# Patient Record
Sex: Male | Born: 1960
Health system: Southern US, Community
[De-identification: ages and names within clinical notes are randomized; demographics above are authoritative.]

## PROBLEM LIST (undated history)

## (undated) ENCOUNTER — Emergency Department (HOSPITAL_COMMUNITY): Admission: RE | Payer: Commercial Managed Care - PPO | Source: Home / Self Care

## (undated) DIAGNOSIS — F32A Depression, unspecified: Secondary | ICD-10-CM

## (undated) DIAGNOSIS — I5189 Other ill-defined heart diseases: Secondary | ICD-10-CM

## (undated) DIAGNOSIS — I517 Cardiomegaly: Secondary | ICD-10-CM

## (undated) DIAGNOSIS — I509 Heart failure, unspecified: Secondary | ICD-10-CM

## (undated) DIAGNOSIS — E785 Hyperlipidemia, unspecified: Secondary | ICD-10-CM

## (undated) DIAGNOSIS — Z87442 Personal history of urinary calculi: Secondary | ICD-10-CM

## (undated) DIAGNOSIS — M109 Gout, unspecified: Secondary | ICD-10-CM

## (undated) DIAGNOSIS — I2510 Atherosclerotic heart disease of native coronary artery without angina pectoris: Secondary | ICD-10-CM

## (undated) DIAGNOSIS — I10 Essential (primary) hypertension: Secondary | ICD-10-CM

## (undated) DIAGNOSIS — M1990 Unspecified osteoarthritis, unspecified site: Secondary | ICD-10-CM

## (undated) DIAGNOSIS — I739 Peripheral vascular disease, unspecified: Secondary | ICD-10-CM

## (undated) DIAGNOSIS — I213 ST elevation (STEMI) myocardial infarction of unspecified site: Secondary | ICD-10-CM

## (undated) DIAGNOSIS — F1910 Other psychoactive substance abuse, uncomplicated: Secondary | ICD-10-CM

## (undated) DIAGNOSIS — G709 Myoneural disorder, unspecified: Secondary | ICD-10-CM

## (undated) HISTORY — PX: CERVICAL SPINE SURGERY: SHX589

## (undated) HISTORY — PX: OTHER SURGICAL HISTORY: SHX169

---

## 2004-10-02 ENCOUNTER — Inpatient Hospital Stay: Payer: Self-pay | Admitting: Cardiology

## 2005-01-29 ENCOUNTER — Other Ambulatory Visit: Payer: Self-pay

## 2005-01-29 ENCOUNTER — Inpatient Hospital Stay: Payer: Self-pay | Admitting: Psychiatry

## 2005-02-04 ENCOUNTER — Ambulatory Visit: Payer: Self-pay | Admitting: Unknown Physician Specialty

## 2006-10-25 ENCOUNTER — Emergency Department: Payer: Self-pay | Admitting: General Practice

## 2008-08-14 ENCOUNTER — Ambulatory Visit: Payer: Self-pay | Admitting: Internal Medicine

## 2010-11-15 ENCOUNTER — Ambulatory Visit: Payer: Self-pay | Admitting: Internal Medicine

## 2011-02-26 ENCOUNTER — Emergency Department: Payer: Self-pay | Admitting: *Deleted

## 2011-11-20 ENCOUNTER — Ambulatory Visit: Payer: Self-pay

## 2011-11-20 ENCOUNTER — Emergency Department: Payer: Self-pay | Admitting: *Deleted

## 2011-11-20 LAB — CK TOTAL AND CKMB (NOT AT ARMC)
CK, Total: 62 U/L (ref 35–232)
CK-MB: <0.5 ng/mL — ABNORMAL LOW (ref 0.5–3.6)

## 2011-11-20 LAB — COMPREHENSIVE METABOLIC PANEL
Albumin: 3.7 g/dL (ref 3.4–5.0)
Alkaline Phosphatase: 105 U/L (ref 50–136)
Anion Gap: 7 (ref 7–16)
BUN: 7 mg/dL (ref 7–18)
Bilirubin,Total: 0.6 mg/dL (ref 0.2–1.0)
Chloride: 106 mmol/L (ref 98–107)
Co2: 25 mmol/L (ref 21–32)
Creatinine: 0.75 mg/dL (ref 0.60–1.30)
EGFR (African American): >60
EGFR (Non-African Amer.): >60
Glucose: 115 mg/dL — ABNORMAL HIGH (ref 65–99)
Potassium: 3.9 mmol/L (ref 3.5–5.1)
SGOT(AST): 24 U/L (ref 15–37)
SGPT (ALT): 20 U/L
Sodium: 138 mmol/L (ref 136–145)

## 2011-11-20 LAB — URINALYSIS, COMPLETE
Bacteria: NONE SEEN
Bilirubin,UR: NEGATIVE
Glucose,UR: NEGATIVE mg/dL (ref 0–75)
Ketone: NEGATIVE
Leukocyte Esterase: NEGATIVE
Protein: 100
Specific Gravity: 1.026 (ref 1.003–1.030)
Squamous Epithelial: NONE SEEN

## 2011-11-20 LAB — CBC
HCT: 49.6 % (ref 40.0–52.0)
HGB: 17.2 g/dL (ref 13.0–18.0)
MCH: 35.1 pg — ABNORMAL HIGH (ref 26.0–34.0)
MCHC: 34.7 g/dL (ref 32.0–36.0)
Platelet: 135 10*3/uL — ABNORMAL LOW (ref 150–440)
RBC: 4.89 10*6/uL (ref 4.40–5.90)
RDW: 14.3 % (ref 11.5–14.5)

## 2011-11-20 LAB — TROPONIN I: Troponin-I: <0.02 ng/mL

## 2011-11-20 LAB — ETHANOL: Ethanol %: <0.003 % (ref 0.000–0.080)

## 2012-09-23 ENCOUNTER — Ambulatory Visit: Payer: Self-pay

## 2014-03-24 ENCOUNTER — Inpatient Hospital Stay: Payer: Self-pay | Admitting: Internal Medicine

## 2014-03-24 LAB — CBC WITH DIFFERENTIAL/PLATELET
BASOS ABS: 0 10*3/uL (ref 0.0–0.1)
BASOS PCT: 0.4 %
EOS PCT: 0.1 %
Eosinophil #: 0 10*3/uL (ref 0.0–0.7)
HCT: 38.7 % — AB (ref 40.0–52.0)
HGB: 12.9 g/dL — ABNORMAL LOW (ref 13.0–18.0)
LYMPHS PCT: 2.9 %
Lymphocyte #: 0.4 10*3/uL — ABNORMAL LOW (ref 1.0–3.6)
MCH: 35.5 pg — ABNORMAL HIGH (ref 26.0–34.0)
MCHC: 33.4 g/dL (ref 32.0–36.0)
MCV: 106 fL — AB (ref 80–100)
Monocyte #: 0.6 x10 3/mm (ref 0.2–1.0)
Monocyte %: 4.9 %
NEUTROS ABS: 11.5 10*3/uL — AB (ref 1.4–6.5)
Neutrophil %: 91.7 %
PLATELETS: 103 10*3/uL — AB (ref 150–440)
RBC: 3.64 10*6/uL — ABNORMAL LOW (ref 4.40–5.90)
RDW: 14.5 % (ref 11.5–14.5)
WBC: 12.5 10*3/uL — AB (ref 3.8–10.6)

## 2014-03-24 LAB — COMPREHENSIVE METABOLIC PANEL
ANION GAP: 14 (ref 7–16)
Albumin: 2.9 g/dL — ABNORMAL LOW (ref 3.4–5.0)
Alkaline Phosphatase: 43 U/L — ABNORMAL LOW
BUN: 37 mg/dL — ABNORMAL HIGH (ref 7–18)
Bilirubin,Total: 0.5 mg/dL (ref 0.2–1.0)
CHLORIDE: 99 mmol/L (ref 98–107)
CO2: 19 mmol/L — AB (ref 21–32)
Calcium, Total: 9 mg/dL (ref 8.5–10.1)
Creatinine: 2.05 mg/dL — ABNORMAL HIGH (ref 0.60–1.30)
EGFR (African American): 42 — ABNORMAL LOW
GFR CALC NON AF AMER: 36 — AB
Glucose: 131 mg/dL — ABNORMAL HIGH (ref 65–99)
Osmolality: 275 (ref 275–301)
POTASSIUM: 2.6 mmol/L — AB (ref 3.5–5.1)
SGOT(AST): 49 U/L — ABNORMAL HIGH (ref 15–37)
SGPT (ALT): 15 U/L
SODIUM: 132 mmol/L — AB (ref 136–145)
TOTAL PROTEIN: 7.9 g/dL (ref 6.4–8.2)

## 2014-03-24 LAB — RAPID INFLUENZA A&B ANTIGENS (ARMC ONLY)

## 2014-03-24 LAB — MAGNESIUM: Magnesium: 1.7 mg/dL — ABNORMAL LOW

## 2014-03-24 LAB — LIPASE, BLOOD: LIPASE: 126 U/L (ref 73–393)

## 2014-03-25 LAB — CBC WITH DIFFERENTIAL/PLATELET
BASOS PCT: 0.3 %
Basophil #: 0 10*3/uL (ref 0.0–0.1)
Eosinophil #: 0 10*3/uL (ref 0.0–0.7)
Eosinophil %: 0 %
HCT: 34.5 % — ABNORMAL LOW (ref 40.0–52.0)
HGB: 11.5 g/dL — ABNORMAL LOW (ref 13.0–18.0)
Lymphocyte #: 0.5 10*3/uL — ABNORMAL LOW (ref 1.0–3.6)
Lymphocyte %: 5 %
MCH: 35.7 pg — AB (ref 26.0–34.0)
MCHC: 33.5 g/dL (ref 32.0–36.0)
MCV: 107 fL — ABNORMAL HIGH (ref 80–100)
Monocyte #: 0.5 x10 3/mm (ref 0.2–1.0)
Monocyte %: 5.1 %
NEUTROS PCT: 89.6 %
Neutrophil #: 9.2 10*3/uL — ABNORMAL HIGH (ref 1.4–6.5)
Platelet: 94 10*3/uL — ABNORMAL LOW (ref 150–440)
RBC: 3.23 10*6/uL — AB (ref 4.40–5.90)
RDW: 14.6 % — AB (ref 11.5–14.5)
WBC: 10.2 10*3/uL (ref 3.8–10.6)

## 2014-03-25 LAB — BASIC METABOLIC PANEL
Anion Gap: 13 (ref 7–16)
BUN: 27 mg/dL — ABNORMAL HIGH (ref 7–18)
CHLORIDE: 104 mmol/L (ref 98–107)
CO2: 17 mmol/L — AB (ref 21–32)
Calcium, Total: 8.3 mg/dL — ABNORMAL LOW (ref 8.5–10.1)
Creatinine: 1.42 mg/dL — ABNORMAL HIGH (ref 0.60–1.30)
EGFR (African American): >60
GFR CALC NON AF AMER: 56 — AB
GLUCOSE: 99 mg/dL (ref 65–99)
OSMOLALITY: 273 (ref 275–301)
POTASSIUM: 3.5 mmol/L (ref 3.5–5.1)
SODIUM: 134 mmol/L — AB (ref 136–145)

## 2014-03-25 LAB — MAGNESIUM: Magnesium: 1.9 mg/dL

## 2014-03-26 LAB — BASIC METABOLIC PANEL
ANION GAP: 12 (ref 7–16)
BUN: 14 mg/dL (ref 7–18)
CHLORIDE: 106 mmol/L (ref 98–107)
Calcium, Total: 8.5 mg/dL (ref 8.5–10.1)
Co2: 21 mmol/L (ref 21–32)
Creatinine: 1.14 mg/dL (ref 0.60–1.30)
EGFR (African American): >60
Glucose: 89 mg/dL (ref 65–99)
Osmolality: 277 (ref 275–301)
POTASSIUM: 2.7 mmol/L — AB (ref 3.5–5.1)
Sodium: 139 mmol/L (ref 136–145)

## 2014-03-26 LAB — CBC WITH DIFFERENTIAL/PLATELET
Basophil #: 0 10*3/uL (ref 0.0–0.1)
Basophil %: 0.2 %
EOS ABS: 0 10*3/uL (ref 0.0–0.7)
Eosinophil %: 0.1 %
HCT: 32.1 % — ABNORMAL LOW (ref 40.0–52.0)
HGB: 10.6 g/dL — ABNORMAL LOW (ref 13.0–18.0)
Lymphocyte #: 0.4 10*3/uL — ABNORMAL LOW (ref 1.0–3.6)
Lymphocyte %: 5.7 %
MCH: 35.1 pg — ABNORMAL HIGH (ref 26.0–34.0)
MCHC: 33 g/dL (ref 32.0–36.0)
MCV: 106 fL — AB (ref 80–100)
MONO ABS: 0.3 x10 3/mm (ref 0.2–1.0)
Monocyte %: 4.9 %
NEUTROS ABS: 6.1 10*3/uL (ref 1.4–6.5)
Neutrophil %: 89.1 %
Platelet: 137 10*3/uL — ABNORMAL LOW (ref 150–440)
RBC: 3.02 10*6/uL — ABNORMAL LOW (ref 4.40–5.90)
RDW: 15.1 % — ABNORMAL HIGH (ref 11.5–14.5)
WBC: 6.8 10*3/uL (ref 3.8–10.6)

## 2014-03-27 LAB — BASIC METABOLIC PANEL
Anion Gap: 9 (ref 7–16)
BUN: 11 mg/dL (ref 7–18)
CALCIUM: 8.2 mg/dL — AB (ref 8.5–10.1)
CHLORIDE: 108 mmol/L — AB (ref 98–107)
CO2: 19 mmol/L — AB (ref 21–32)
Creatinine: 1.12 mg/dL (ref 0.60–1.30)
EGFR (African American): >60
EGFR (Non-African Amer.): >60
GLUCOSE: 106 mg/dL — AB (ref 65–99)
Osmolality: 272 (ref 275–301)
Potassium: 3 mmol/L — ABNORMAL LOW (ref 3.5–5.1)
Sodium: 136 mmol/L (ref 136–145)

## 2014-03-28 LAB — BASIC METABOLIC PANEL
Anion Gap: 6 — ABNORMAL LOW (ref 7–16)
BUN: 8 mg/dL (ref 7–18)
CO2: 22 mmol/L (ref 21–32)
Calcium, Total: 7.8 mg/dL — ABNORMAL LOW (ref 8.5–10.1)
Chloride: 111 mmol/L — ABNORMAL HIGH (ref 98–107)
Creatinine: 0.97 mg/dL (ref 0.60–1.30)
EGFR (African American): >60
Glucose: 106 mg/dL — ABNORMAL HIGH (ref 65–99)
Osmolality: 276 (ref 275–301)
Potassium: 3.4 mmol/L — ABNORMAL LOW (ref 3.5–5.1)
Sodium: 139 mmol/L (ref 136–145)

## 2014-03-29 LAB — EXPECTORATED SPUTUM ASSESSMENT W REFEX TO RESP CULTURE

## 2014-03-29 LAB — CULTURE, BLOOD (SINGLE)

## 2014-03-31 LAB — CULTURE, BLOOD (SINGLE)
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
## 2014-10-28 NOTE — Discharge Summary (Signed)
Dates of Admission and Diagnosis:  Date of Admission 24-Mar-2014   Date of Discharge 28-Mar-2014   Admitting Diagnosis sepsis- pneumonia   Final Diagnosis Sepsis Legionella and possible strep pneumonia ( awaited final sputum cx report) Acute renal failure - on presentation- dehydration- improved Hypokalemia Smoking.    Chief Complaint/History of Present Illness a very pleasant 54 year old man with past medical history of hypertension, ongoing tobacco abuse, presents with fevers, chills, vomiting for the past 3 days. He reports his symptoms started 3 days ago with chills, runny nose, and headache, progressed to vomiting that evening. He has not been able to keep anything down except for small sips of Sunny-D and water for the past 3 days. He denies any cough or shortness of breath. He endorses sweats and chills. He has not had any chest pain. He has had 2 episodes of syncope, one last night and one this morning when he fell out of the shower to the floor. After that incident, he tried to call his sons and neighbors and was unable to get in touch with anyone. He then proceeded to drive himself to the Emergency Room very slowly and pulling off frequently for vomiting and weakness. On presentation to the Emergency Room, his temperature is 101.2, white count is elevated at 12.5, and his chest x-ray shows a left lower lobe pneumonia.   Allergies:  No Known Allergies:   Hepatic:  18-Sep-15 08:50   Bilirubin, Total 0.5  Alkaline Phosphatase  43 (46-116 NOTE: New Reference Range 01/24/14)  SGPT (ALT) 15 (14-63 NOTE: New Reference Range 01/24/14)  SGOT (AST)  49  Total Protein, Serum 7.9  Albumin, Serum  2.9  Routine Micro:  18-Sep-15 08:50   Micro Text Report INFLUENZA A+B ANTIGENS   COMMENT                   NEGATIVE FOR INFLUENZA A (ANTIGEN ABSENT)   COMMENT                   NEGATIVE FOR INFLUENZA B (ANTIGEN ABSENT)   ANTIBIOTIC                       Comment 1.. NEGATIVE FOR  INFLUENZA A (ANTIGEN ABSENT) A negative result does not exclude influenza. Correlation with clinical impression is required.  Comment 2.. NEGATIVE FOR INFLUENZA B (ANTIGEN ABSENT)  Result(s) reported on 24 Mar 2014 at 09:49AM.  20-Sep-15 12:32   Micro Text Report BLOOD CULTURE   COMMENT                   NO GROWTH IN 48 HOURS   ANTIBIOTIC                       Culture Comment NO GROWTH IN 48 HOURS  Result(s) reported on 28 Mar 2014 at 12:00PM.  Specimen Source left ac  21-Sep-15 08:35   Micro Text Report SPUTUM CULTURE   COMMENT                   APPEARS TO BE NORMAL FLORA AT 48 HOURS   GRAM STAIN                FAIR SPECIMEN-70-80% WBC   GRAM STAIN                MODERATE WHITE BLOOD CELLS   GRAM STAIN  FEW GRAM POSITIVE COCCI IN CHAINS IN CLUSTERS   GRAM STAIN                MODERATE GRAM NEGATIVE COCCO-BACILLI   ANTIBIOTIC                       Culture Comment APPEARS TO BE NORMAL FLORA AT 48 HOURS  Gram Stain 1 FAIR SPECIMEN-70-80% WBC  Gram Stain 2 MODERATE WHITE BLOOD CELLS  Gram Stain 3 FEW GRAM POSITIVE COCCI IN CHAINS IN CLUSTERS  Gram Stain 4 MODERATE GRAM NEGATIVE COCCO-BACILLI  Result(s) reported on 29 Mar 2014 at 02:55PM.  Routine Chem:  18-Sep-15 08:50   Glucose, Serum  131  BUN  37  Creatinine (comp)  2.05  Sodium, Serum  132  Potassium, Serum  2.6  Chloride, Serum 99  CO2, Serum  19  Calcium (Total), Serum 9.0  Anion Gap 14  Osmolality (calc) 275  eGFR (African American)  42  eGFR (Non-African American)  36 (eGFR values <26m/min/1.73 m2 may be an indication of chronic kidney disease (CKD). Calculated eGFR is useful in patients with stable renal function. The eGFR calculation will not be reliable in acutely ill patients when serum creatinine is changing rapidly. It is not useful in  patients on dialysis. The eGFR calculation may not be applicable to patients at the low and high extremes of body sizes, pregnant women, and vegetarians.)   Magnesium, Serum  1.7 (1.8-2.4 THERAPEUTIC RANGE: 4-7 mg/dL TOXIC: > 10 mg/dL  -----------------------)  Lipase 126 (Result(s) reported on 24 Mar 2014 at 09:17AM.)  19-Sep-15 01:57   Glucose, Serum 99  BUN  27  Creatinine (comp)  1.42  Sodium, Serum  134  Potassium, Serum 3.5  Chloride, Serum 104  CO2, Serum  17  Calcium (Total), Serum  8.3  Anion Gap 13  Osmolality (calc) 273  eGFR (African American) >60  eGFR (Non-African American)  56 (eGFR values <62mmin/1.73 m2 may be an indication of chronic kidney disease (CKD). Calculated eGFR is useful in patients with stable renal function. The eGFR calculation will not be reliable in acutely ill patients when serum creatinine is changing rapidly. It is not useful in  patients on dialysis. The eGFR calculation may not be applicable to patients at the low and high extremes of body sizes, pregnant women, and vegetarians.)  Magnesium, Serum 1.9 (1.8-2.4 THERAPEUTIC RANGE: 4-7 mg/dL TOXIC: > 10 mg/dL  -----------------------)  20-Sep-15 12:32   Glucose, Serum 89  BUN 14  Creatinine (comp) 1.14  Sodium, Serum 139  Potassium, Serum  2.7  Chloride, Serum 106  CO2, Serum 21  Calcium (Total), Serum 8.5  Anion Gap 12  Osmolality (calc) 277  eGFR (African American) >60  eGFR (Non-African American) >60 (eGFR values <6034min/1.73 m2 may be an indication of chronic kidney disease (CKD). Calculated eGFR is useful in patients with stable renal function. The eGFR calculation will not be reliable in acutely ill patients when serum creatinine is changing rapidly. It is not useful in  patients on dialysis. The eGFR calculation may not be applicable to patients at the low and high extremes of body sizes, pregnant women, and vegetarians.)  21-Sep-15 05:26   Glucose, Serum  106  BUN 11  Creatinine (comp) 1.12  Sodium, Serum 136  Potassium, Serum  3.0  Chloride, Serum  108  CO2, Serum  19  Calcium (Total), Serum  8.2  Anion Gap 9   Osmolality (calc) 272  eGFR (African American) >  60  eGFR (Non-African American) >60 (eGFR values <46m/min/1.73 m2 may be an indication of chronic kidney disease (CKD). Calculated eGFR is useful in patients with stable renal function. The eGFR calculation will not be reliable in acutely ill patients when serum creatinine is changing rapidly. It is not useful in  patients on dialysis. The eGFR calculation may not be applicable to patients at the low and high extremes of body sizes, pregnant women, and vegetarians.)  22-Sep-15 08:23   Glucose, Serum  106  BUN 8  Creatinine (comp) 0.97  Sodium, Serum 139  Potassium, Serum  3.4  Chloride, Serum  111  CO2, Serum 22  Calcium (Total), Serum  7.8  Anion Gap  6  Osmolality (calc) 276  eGFR (African American) >60  eGFR (Non-African American) >60 (eGFR values <66mmin/1.73 m2 may be an indication of chronic kidney disease (CKD). Calculated eGFR is useful in patients with stable renal function. The eGFR calculation will not be reliable in acutely ill patients when serum creatinine is changing rapidly. It is not useful in  patients on dialysis. The eGFR calculation may not be applicable to patients at the low and high extremes of body sizes, pregnant women, and vegetarians.)  Routine Hem:  18-Sep-15 08:50   WBC (CBC)  12.5  RBC (CBC)  3.64  Hemoglobin (CBC)  12.9  Hematocrit (CBC)  38.7  Platelet Count (CBC)  103  MCV  106  MCH  35.5  MCHC 33.4  RDW 14.5  Neutrophil % 91.7  Lymphocyte % 2.9  Monocyte % 4.9  Eosinophil % 0.1  Basophil % 0.4  Neutrophil #  11.5  Lymphocyte #  0.4  Monocyte # 0.6  Eosinophil # 0.0  Basophil # 0.0 (Result(s) reported on 24 Mar 2014 at 09:03AM.)  19-Sep-15 01:57   WBC (CBC) 10.2  RBC (CBC)  3.23  Hemoglobin (CBC)  11.5  Hematocrit (CBC)  34.5  Platelet Count (CBC)  94  MCV  107  MCH  35.7  MCHC 33.5  RDW  14.6  Neutrophil % 89.6  Lymphocyte % 5.0  Monocyte % 5.1  Eosinophil % 0.0   Basophil % 0.3  Neutrophil #  9.2  Lymphocyte #  0.5  Monocyte # 0.5  Eosinophil # 0.0  Basophil # 0.0 (Result(s) reported on 25 Mar 2014 at 02:26AM.)  20-Sep-15 12:32   WBC (CBC) 6.8  RBC (CBC)  3.02  Hemoglobin (CBC)  10.6  Hematocrit (CBC)  32.1  Platelet Count (CBC)  137  MCV  106  MCH  35.1  MCHC 33.0  RDW  15.1  Neutrophil % 89.1  Lymphocyte % 5.7  Monocyte % 4.9  Eosinophil % 0.1  Basophil % 0.2  Neutrophil # 6.1  Lymphocyte #  0.4  Monocyte # 0.3  Eosinophil # 0.0  Basophil # 0.0 (Result(s) reported on 26 Mar 2014 at 12:56PM.)   PERTINENT RADIOLOGY STUDIES: XRay:    20-Sep-15 12:49, Chest PA and Lateral  Chest PA and Lateral   REASON FOR EXAM:    pneumonia- compare  COMMENTS:       PROCEDURE: DXR - DXR CHEST PA (OR AP) AND LATERAL  - Mar 26 2014 12:49PM     CLINICAL DATA:  Fever. Chills. Hypotension. Followup left lower lobe  pneumonia.    EXAM:  CHEST  2 VIEW    COMPARISON:  Two-view chest x-ray 03/24/2014, 11/20/2011.    FINDINGS:  Cardiomediastinal silhouette unremarkable, unchanged. Interval  marked progression of the airspace consolidation, now involving  essentially the entire left lower lobe. Left upper lobe andright  lung remain clear. Degenerative changes involving the thoracic spine  and slight thoracic scoliosis convex right again noted.     IMPRESSION:  Interval marked worsening of pneumonia since the examination 2 days  ago, now essentially the entireleft lower lobe.      Electronically Signed    By: Evangeline Dakin M.D.    On: 03/26/2014 12:54       Verified By: Deniece Portela, M.D.,  LabUnknown:  PACS Image    Pertinent Past History:  Pertinent Past History 1.  Hypertension.  2.  Coronary artery disease, status post PCI in 2006.  3.  Ongoing tobacco abuse.  4.  History of alcohol abuse   Hospital Course:  Hospital Course 1. Sepsis: present on admisison.  heart rate of 130, temperature of 101.2, hypotension with  blood pressure of 103/79 in a patient with chronic hypertension.  due to left lower pneumonia. Blood cultures are negative. The patient was not producing sputum initially- but had one with indusction lateron. BP now stable.  2  Left lower lobe pneumonia: Community-acquired pneumonia.   Azithromycin and ceftriaxone . Influenza A and B antigen screens are negative. He is not hypoxic or in respiratory distress.     Continued to have fever, and Xray shows worsening of Pneumonia- Started on Linezolid, Zosyn, Levaquin.   collected sputum cx- Spoke to Micro lab- it is probably streptococci, will be final report tomorrow.   Urine positive  for legionella Ag.   i spoke to Dr. Ola Spurr on phone- as per him Levaquin 750 mg daily- should be able to cover both- but we need to follwo up final cx report even after discharge.   sputum- Gram positive cocci in chain.  3  Acute renal failure.     likely due to ATN, prerenal azotemia due to decreased p.o. intake with nausea and vomiting.hydrate aggressively and continue to monitor. improving.   normal now.  4  Hypokalemia: Likely due to gastrointestinal losses. monitor and replete as needed.   5  Hypomagnesemia: Likely due to gastrointestinal losses. Repleted.  6. macrocytosis- Ch alcoholism- councelled- no withdrawal signs.  7. smoking- councelled for cessation- he is on nicotin patch- will give on d/c. time spent 4 min. d/c home today.   Condition on Discharge Stable   Code Status:  Code Status Full Code   DISCHARGE INSTRUCTIONS HOME MEDS:  Medication Reconciliation: Patient's Home Medications at Discharge:     Medication Instructions  lisinopril 10 mg oral tablet  1 tab(s) orally once a day   aspirin 81 mg oral tablet  1 tab(s) orally once a day   acetaminophen 325 mg oral tablet  2 tab(s) orally every 4 hours, As needed, mild pain (1-3/10) or temp. greater than 100.4   benzocaine-menthol topical  1 lozenge orally every 2 hours, As Needed,  cough, sore throat , As needed, cough, sore throat   levaquin 750 mg oral tablet  1 tab(s) orally every 24 hours x 4 days    STOP TAKING THE FOLLOWING MEDICATION(S):    metoprolol tartrate 50 mg tablet: 1 tab(s) orally 2 times a day x 30 days  Physician's Instructions:  Diet Low Sodium   Activity Limitations As tolerated   Return to Work Not Applicable   Time frame for Follow Up Appointment 1-2 weeks  PMD   Other Comments follow with PMD in office. If have fever in next few days - contact your PMD's  office or come to ER.     Domenick Gong R(Family Physician): [facility], [address], [place], [place] [address], [place]  Electronic Signatures: Vaughan Basta (MD)  (Signed 24-Sep-15 14:04)  Authored: ADMISSION DATE AND DIAGNOSIS, CHIEF COMPLAINT/HPI, Allergies, PERTINENT LABS, PERTINENT RADIOLOGY STUDIES, PERTINENT PAST HISTORY, HOSPITAL COURSE, DISCHARGE INSTRUCTIONS HOME MEDS, PATIENT INSTRUCTIONS, Follow Up Physician   Last Updated: 24-Sep-15 14:04 by Vaughan Basta (MD)

## 2015-07-08 DIAGNOSIS — Z9861 Coronary angioplasty status: Secondary | ICD-10-CM

## 2015-07-08 DIAGNOSIS — I213 ST elevation (STEMI) myocardial infarction of unspecified site: Secondary | ICD-10-CM

## 2015-07-08 DIAGNOSIS — Z955 Presence of coronary angioplasty implant and graft: Secondary | ICD-10-CM

## 2015-07-08 HISTORY — DX: ST elevation (STEMI) myocardial infarction of unspecified site: I21.3

## 2015-07-08 HISTORY — DX: Coronary angioplasty status: Z98.61

## 2015-07-08 HISTORY — DX: Presence of coronary angioplasty implant and graft: Z95.5

## 2016-06-17 ENCOUNTER — Emergency Department
Admission: EM | Admit: 2016-06-17 | Discharge: 2016-06-17 | Disposition: A | Discharge status: Other Acute Inpatient Hospital | Payer: Commercial Managed Care - PPO | Attending: Emergency Medicine | Admitting: Emergency Medicine

## 2016-06-17 ENCOUNTER — Ambulatory Visit (HOSPITAL_COMMUNITY): Admit: 2016-06-17 | Payer: Commercial Managed Care - PPO | Admitting: Cardiovascular Disease

## 2016-06-17 ENCOUNTER — Other Ambulatory Visit (HOSPITAL_COMMUNITY): Payer: Commercial Managed Care - PPO

## 2016-06-17 ENCOUNTER — Other Ambulatory Visit: Payer: Self-pay

## 2016-06-17 ENCOUNTER — Inpatient Hospital Stay (HOSPITAL_COMMUNITY)
Admission: RE | Admit: 2016-06-17 | Discharge: 2016-06-20 | DRG: 247 | Disposition: A | Discharge status: Home / Self Care | Payer: Commercial Managed Care - PPO | Source: Other Acute Inpatient Hospital | Attending: Cardiovascular Disease | Admitting: Cardiovascular Disease

## 2016-06-17 ENCOUNTER — Emergency Department: Payer: Commercial Managed Care - PPO

## 2016-06-17 ENCOUNTER — Encounter (HOSPITAL_COMMUNITY)
Admission: RE | Disposition: A | Discharge status: Home / Self Care | Payer: Self-pay | Source: Other Acute Inpatient Hospital | Attending: Cardiovascular Disease

## 2016-06-17 DIAGNOSIS — Z955 Presence of coronary angioplasty implant and graft: Secondary | ICD-10-CM

## 2016-06-17 DIAGNOSIS — Z23 Encounter for immunization: Secondary | ICD-10-CM

## 2016-06-17 DIAGNOSIS — I119 Hypertensive heart disease without heart failure: Secondary | ICD-10-CM | POA: Diagnosis not present

## 2016-06-17 DIAGNOSIS — E785 Hyperlipidemia, unspecified: Secondary | ICD-10-CM | POA: Diagnosis present

## 2016-06-17 DIAGNOSIS — I2102 ST elevation (STEMI) myocardial infarction involving left anterior descending coronary artery: Secondary | ICD-10-CM

## 2016-06-17 DIAGNOSIS — I2109 ST elevation (STEMI) myocardial infarction involving other coronary artery of anterior wall: Principal | ICD-10-CM

## 2016-06-17 DIAGNOSIS — I2510 Atherosclerotic heart disease of native coronary artery without angina pectoris: Secondary | ICD-10-CM | POA: Insufficient documentation

## 2016-06-17 DIAGNOSIS — R079 Chest pain, unspecified: Secondary | ICD-10-CM | POA: Diagnosis present

## 2016-06-17 DIAGNOSIS — I10 Essential (primary) hypertension: Secondary | ICD-10-CM

## 2016-06-17 DIAGNOSIS — I513 Intracardiac thrombosis, not elsewhere classified: Secondary | ICD-10-CM | POA: Diagnosis not present

## 2016-06-17 DIAGNOSIS — Z8249 Family history of ischemic heart disease and other diseases of the circulatory system: Secondary | ICD-10-CM | POA: Diagnosis not present

## 2016-06-17 DIAGNOSIS — F17200 Nicotine dependence, unspecified, uncomplicated: Secondary | ICD-10-CM | POA: Insufficient documentation

## 2016-06-17 DIAGNOSIS — I200 Unstable angina: Secondary | ICD-10-CM

## 2016-06-17 DIAGNOSIS — E782 Mixed hyperlipidemia: Secondary | ICD-10-CM | POA: Diagnosis not present

## 2016-06-17 DIAGNOSIS — I213 ST elevation (STEMI) myocardial infarction of unspecified site: Secondary | ICD-10-CM | POA: Diagnosis present

## 2016-06-17 HISTORY — PX: CARDIAC CATHETERIZATION: SHX172

## 2016-06-17 HISTORY — DX: Hyperlipidemia, unspecified: E78.5

## 2016-06-17 HISTORY — DX: Atherosclerotic heart disease of native coronary artery without angina pectoris: I25.10

## 2016-06-17 HISTORY — DX: Essential (primary) hypertension: I10

## 2016-06-17 LAB — CBC
HEMATOCRIT: 59.1 % — AB (ref 40.0–52.0)
HEMOGLOBIN: 20.4 g/dL — AB (ref 13.0–18.0)
MCH: 39.4 pg — AB (ref 26.0–34.0)
MCHC: 34.6 g/dL (ref 32.0–36.0)
MCV: 114 fL — ABNORMAL HIGH (ref 80.0–100.0)
Platelets: 154 10*3/uL (ref 150–440)
RBC: 5.18 MIL/uL (ref 4.40–5.90)
RDW: 15.1 % — ABNORMAL HIGH (ref 11.5–14.5)
WBC: 12.2 10*3/uL — ABNORMAL HIGH (ref 3.8–10.6)

## 2016-06-17 LAB — TROPONIN I
Troponin I: 0.48 ng/mL (ref <0.03)
Troponin I: >65 ng/mL (ref <0.03)

## 2016-06-17 LAB — POCT ACTIVATED CLOTTING TIME: ACTIVATED CLOTTING TIME: 434 s

## 2016-06-17 LAB — PROTIME-INR
INR: 1.09
PROTHROMBIN TIME: 14.1 s (ref 11.4–15.2)

## 2016-06-17 LAB — BASIC METABOLIC PANEL
ANION GAP: 12 (ref 5–15)
BUN: 7 mg/dL (ref 6–20)
CHLORIDE: 100 mmol/L — AB (ref 101–111)
CO2: 25 mmol/L (ref 22–32)
Calcium: 9.1 mg/dL (ref 8.9–10.3)
Creatinine, Ser: 1.1 mg/dL (ref 0.61–1.24)
GFR calc Af Amer: >60 mL/min (ref >60)
GFR calc non Af Amer: >60 mL/min (ref >60)
GLUCOSE: 150 mg/dL — AB (ref 65–99)
POTASSIUM: 2.9 mmol/L — AB (ref 3.5–5.1)
Sodium: 137 mmol/L (ref 135–145)

## 2016-06-17 LAB — MRSA PCR SCREENING: MRSA BY PCR: NEGATIVE

## 2016-06-17 SURGERY — LEFT HEART CATH AND CORONARY ANGIOGRAPHY
Anesthesia: LOCAL

## 2016-06-17 MED ORDER — BIVALIRUDIN 250 MG IV SOLR
INTRAVENOUS | Status: AC
  Filled 2016-06-17: qty 250

## 2016-06-17 MED ORDER — NITROGLYCERIN IN D5W 200-5 MCG/ML-% IV SOLN
INTRAVENOUS | Status: AC
  Filled 2016-06-17: qty 250

## 2016-06-17 MED ORDER — HEPARIN (PORCINE) IN NACL 2-0.9 UNIT/ML-% IJ SOLN
INTRAMUSCULAR | Status: DC | PRN
  Administered 2016-06-17: 1000 mL

## 2016-06-17 MED ORDER — HYDRALAZINE HCL 20 MG/ML IJ SOLN: 5.0000 mg | INTRAMUSCULAR | Status: DC | PRN

## 2016-06-17 MED ORDER — HEPARIN (PORCINE) IN NACL 100-0.45 UNIT/ML-% IJ SOLN: 1150.0000 [IU]/h | INTRAMUSCULAR | Status: DC

## 2016-06-17 MED ORDER — ACETAMINOPHEN 325 MG PO TABS: 650.0000 mg | ORAL_TABLET | ORAL | Status: DC | PRN

## 2016-06-17 MED ORDER — HEPARIN (PORCINE) IN NACL 100-0.45 UNIT/ML-% IJ SOLN
1500.0000 [IU]/h | INTRAMUSCULAR | Status: DC
  Administered 2016-06-17: 1150 [IU]/h via INTRAVENOUS
  Filled 2016-06-17: qty 250

## 2016-06-17 MED ORDER — SODIUM CHLORIDE 0.9 % IV SOLN
INTRAVENOUS | Status: DC | PRN
  Administered 2016-06-17: 10 mL/h via INTRAVENOUS

## 2016-06-17 MED ORDER — CLOPIDOGREL BISULFATE 300 MG PO TABS
ORAL_TABLET | ORAL | Status: DC | PRN
  Administered 2016-06-17: 600 mg via ORAL

## 2016-06-17 MED ORDER — LIDOCAINE HCL (PF) 1 % IJ SOLN
INTRAMUSCULAR | Status: DC | PRN
  Administered 2016-06-17: 20 mL via INTRADERMAL

## 2016-06-17 MED ORDER — IOPAMIDOL (ISOVUE-370) INJECTION 76%
INTRAVENOUS | Status: DC | PRN
  Administered 2016-06-17: 160 mL via INTRA_ARTERIAL

## 2016-06-17 MED ORDER — LIDOCAINE HCL (PF) 1 % IJ SOLN
INTRAMUSCULAR | Status: AC
  Filled 2016-06-17: qty 30

## 2016-06-17 MED ORDER — PNEUMOCOCCAL VAC POLYVALENT 25 MCG/0.5ML IJ INJ
0.5000 mL | INJECTION | INTRAMUSCULAR | Status: AC
  Administered 2016-06-18: 0.5 mL via INTRAMUSCULAR
  Filled 2016-06-17: qty 0.5

## 2016-06-17 MED ORDER — ONDANSETRON HCL 4 MG/2ML IJ SOLN: 4.0000 mg | Freq: Four times a day (QID) | INTRAMUSCULAR | Status: DC | PRN

## 2016-06-17 MED ORDER — LABETALOL HCL 5 MG/ML IV SOLN
10.0000 mg | INTRAVENOUS | Status: DC | PRN
  Administered 2016-06-17: 10 mg via INTRAVENOUS

## 2016-06-17 MED ORDER — ASPIRIN 81 MG PO CHEW
81.0000 mg | CHEWABLE_TABLET | Freq: Every day | ORAL | Status: DC
  Administered 2016-06-17 – 2016-06-20 (×4): 81 mg via ORAL
  Filled 2016-06-17 (×4): qty 1

## 2016-06-17 MED ORDER — IOPAMIDOL (ISOVUE-370) INJECTION 76%
INTRAVENOUS | Status: AC
  Filled 2016-06-17: qty 125

## 2016-06-17 MED ORDER — ASPIRIN 81 MG PO CHEW
324.0000 mg | CHEWABLE_TABLET | Freq: Once | ORAL | Status: AC
  Administered 2016-06-17: 324 mg via ORAL

## 2016-06-17 MED ORDER — POTASSIUM CHLORIDE 20 MEQ PO PACK
PACK | ORAL | Status: DC | PRN
Start: 2016-06-17 — End: 2016-06-17
  Administered 2016-06-17: 40 meq via ORAL

## 2016-06-17 MED ORDER — MORPHINE SULFATE (PF) 2 MG/ML IV SOLN: 2.0000 mg | INTRAVENOUS | Status: DC | PRN

## 2016-06-17 MED ORDER — FAMOTIDINE IN NACL 20-0.9 MG/50ML-% IV SOLN
INTRAVENOUS | Status: AC
  Filled 2016-06-17: qty 50

## 2016-06-17 MED ORDER — HEPARIN SODIUM (PORCINE) 5000 UNIT/ML IJ SOLN
4000.0000 [IU] | Freq: Once | INTRAMUSCULAR | Status: AC
  Administered 2016-06-17: 4000 [IU] via INTRAVENOUS

## 2016-06-17 MED ORDER — SODIUM CHLORIDE 0.9 % IV SOLN
INTRAVENOUS | Status: AC
  Administered 2016-06-17: 07:00:00 via INTRAVENOUS

## 2016-06-17 MED ORDER — BIVALIRUDIN 250 MG IV SOLR
1.7500 mg/kg/h | INTRAVENOUS | Status: AC
  Administered 2016-06-17 (×3): 1.75 mg/kg/h via INTRAVENOUS
  Filled 2016-06-17 (×2): qty 250

## 2016-06-17 MED ORDER — INFLUENZA VAC SPLIT QUAD 0.5 ML IM SUSY
0.5000 mL | PREFILLED_SYRINGE | INTRAMUSCULAR | Status: AC
  Administered 2016-06-18: 0.5 mL via INTRAMUSCULAR
  Filled 2016-06-17: qty 0.5

## 2016-06-17 MED ORDER — CLOPIDOGREL BISULFATE 75 MG PO TABS
ORAL_TABLET | ORAL | Status: AC
  Filled 2016-06-17: qty 8

## 2016-06-17 MED ORDER — SODIUM CHLORIDE 0.9 % IV SOLN: 250.0000 mL | INTRAVENOUS | Status: DC | PRN

## 2016-06-17 MED ORDER — POTASSIUM CHLORIDE CRYS ER 20 MEQ PO TBCR
EXTENDED_RELEASE_TABLET | ORAL | Status: AC
  Filled 2016-06-17: qty 2

## 2016-06-17 MED ORDER — IOPAMIDOL (ISOVUE-370) INJECTION 76%
INTRAVENOUS | Status: AC
  Filled 2016-06-17: qty 100

## 2016-06-17 MED ORDER — HYDRALAZINE HCL 20 MG/ML IJ SOLN
10.0000 mg | INTRAMUSCULAR | Status: DC | PRN
  Filled 2016-06-17: qty 1

## 2016-06-17 MED ORDER — BIVALIRUDIN 250 MG IV SOLR
INTRAVENOUS | Status: DC | PRN
  Administered 2016-06-17 (×2): 1.75 mg/kg/h via INTRAVENOUS

## 2016-06-17 MED ORDER — POTASSIUM CHLORIDE 20 MEQ/15ML (10%) PO SOLN
ORAL | Status: AC
  Filled 2016-06-17: qty 30

## 2016-06-17 MED ORDER — SODIUM CHLORIDE 0.9% FLUSH: 3.0000 mL | INTRAVENOUS | Status: DC | PRN

## 2016-06-17 MED ORDER — FAMOTIDINE IN NACL 20-0.9 MG/50ML-% IV SOLN
INTRAVENOUS | Status: DC | PRN
  Administered 2016-06-17: 20 mg via INTRAVENOUS

## 2016-06-17 MED ORDER — SODIUM CHLORIDE 0.9% FLUSH
3.0000 mL | Freq: Two times a day (BID) | INTRAVENOUS | Status: DC
  Administered 2016-06-17 – 2016-06-20 (×6): 3 mL via INTRAVENOUS

## 2016-06-17 MED ORDER — BIVALIRUDIN BOLUS VIA INFUSION - CUPID
INTRAVENOUS | Status: DC | PRN
  Administered 2016-06-17: 69.75 mg via INTRAVENOUS

## 2016-06-17 MED ORDER — NITROGLYCERIN 2 % TD OINT
1.0000 [in_us] | TOPICAL_OINTMENT | Freq: Once | TRANSDERMAL | Status: AC
  Administered 2016-06-17: 1 [in_us] via TOPICAL
  Filled 2016-06-17: qty 1

## 2016-06-17 MED ORDER — SODIUM CHLORIDE 0.9 % IV BOLUS (SEPSIS)
1000.0000 mL | Freq: Once | INTRAVENOUS | Status: AC
Start: 2016-06-17 — End: 2016-06-17
  Administered 2016-06-17: 1000 mL via INTRAVENOUS

## 2016-06-17 MED ORDER — POTASSIUM CHLORIDE CRYS ER 20 MEQ PO TBCR
40.0000 meq | EXTENDED_RELEASE_TABLET | Freq: Once | ORAL | Status: AC
  Filled 2016-06-17: qty 2

## 2016-06-17 MED ORDER — NITROGLYCERIN IN D5W 200-5 MCG/ML-% IV SOLN
INTRAVENOUS | Status: DC | PRN
  Administered 2016-06-17: 5 ug/min via INTRAVENOUS

## 2016-06-17 MED ORDER — CLOPIDOGREL BISULFATE 75 MG PO TABS
75.0000 mg | ORAL_TABLET | Freq: Every day | ORAL | Status: DC
  Filled 2016-06-17: qty 1

## 2016-06-17 MED ORDER — NITROGLYCERIN 0.4 MG SL SUBL
0.4000 mg | SUBLINGUAL_TABLET | SUBLINGUAL | Status: DC | PRN
  Administered 2016-06-17 (×2): 0.4 mg via SUBLINGUAL

## 2016-06-17 MED ORDER — SODIUM CHLORIDE 0.9 % IV SOLN
30.0000 meq | Freq: Once | INTRAVENOUS | Status: AC
  Administered 2016-06-17: 30 meq via INTRAVENOUS
  Filled 2016-06-17: qty 15

## 2016-06-17 MED ORDER — LABETALOL HCL 5 MG/ML IV SOLN
10.0000 mg | INTRAVENOUS | Status: AC | PRN
  Filled 2016-06-17: qty 4

## 2016-06-17 MED ORDER — NITROGLYCERIN IN D5W 200-5 MCG/ML-% IV SOLN
0.0000 ug/min | INTRAVENOUS | Status: DC
  Administered 2016-06-17: 15 ug/min via INTRAVENOUS

## 2016-06-17 SURGICAL SUPPLY — 26 items
BALLN EMERGE MR 2.5X15 (BALLOONS) ×2
BALLN MOZEC 1.5X9 (BALLOONS) ×2
BALLN MOZEC 2.0X12 (BALLOONS) ×2
BALLN ~~LOC~~ MOZEC 3.0X18 (BALLOONS) ×2
BALLOON EMERGE MR 2.5X15 (BALLOONS) ×1 IMPLANT
BALLOON MOZEC 1.5X9 (BALLOONS) ×1 IMPLANT
BALLOON MOZEC 2.0X12 (BALLOONS) ×1 IMPLANT
BALLOON ~~LOC~~ MOZEC 3.0X18 (BALLOONS) ×1 IMPLANT
CATH INFINITI MULTIPACK ST 5F (CATHETERS) ×2 IMPLANT
CATH VISTA GUIDE 6FR XBLAD3.5 (CATHETERS) ×2 IMPLANT
ELECT DEFIB PAD ADLT CADENCE (PAD) ×2 IMPLANT
GLIDESHEATH SLEND A-KIT 6F 22G (SHEATH) IMPLANT
GUIDEWIRE INQWIRE 1.5J.035X260 (WIRE) IMPLANT
INQWIRE 1.5J .035X260CM (WIRE)
KIT ENCORE 26 ADVANTAGE (KITS) ×2 IMPLANT
KIT HEART LEFT (KITS) ×2 IMPLANT
PACK CARDIAC CATHETERIZATION (CUSTOM PROCEDURE TRAY) ×2 IMPLANT
SHEATH PINNACLE 6F 10CM (SHEATH) ×2 IMPLANT
STENT RESOLUTE ONYX 2.75X30 (Permanent Stent) ×2 IMPLANT
SYR MEDRAD MARK V 150ML (SYRINGE) ×2 IMPLANT
TRANSDUCER W/STOPCOCK (MISCELLANEOUS) ×2 IMPLANT
TUBING CIL FLEX 10 FLL-RA (TUBING) ×2 IMPLANT
WIRE ASAHI FIELDER XT 190CM (WIRE) ×2 IMPLANT
WIRE ASAHI PROWATER 180CM (WIRE) ×4 IMPLANT
WIRE EMERALD 3MM-J .035X150CM (WIRE) ×2 IMPLANT
WIRE HI TORQ VERSACORE-J 145CM (WIRE) IMPLANT

## 2016-06-17 NOTE — Progress Notes (Addendum)
ANTICOAGULATION CONSULT NOTE - Initial Consult  Pharmacy Consult for heparin Indication: chest pain/ACS  No Known Allergies  Patient Measurements: 5' 7'' 93kg Heparin Dosing Weight: 85kg IBW: 66kg  Vital Signs: Temp: 98 F (36.7 C) (12/12 0645) Temp Source: Oral (12/12 0645) BP: 160/121 (12/12 0645) Pulse Rate: 92 (12/12 0645)  Labs:  Recent Labs  06/17/16 0246 06/17/16 0419 06/17/16 0620  HGB 20.4*  --   --   HCT 59.1*  --   --   PLT 154  --   --   LABPROT  --  14.1  --   INR  --  1.09  --   CREATININE 1.10  --   --   TROPONINI 0.48*  --  >65.00*    Estimated Creatinine Clearance: 83.5 mL/min (by C-G formula based on SCr of 1.1 mg/dL).   Medical History: Past Medical History:  Diagnosis Date  . Coronary artery disease   . Hypertension     Medications:  Prescriptions Prior to Admission  Medication Sig Dispense Refill Last Dose  . lisinopril (PRINIVIL,ZESTRIL) 10 MG tablet Take 10 mg by mouth daily.      Scheduled:  . aspirin  81 mg Oral Daily  . [START ON 06/18/2016] clopidogrel  75 mg Oral Daily  . [START ON 06/18/2016] Influenza vac split quadrivalent PF  0.5 mL Intramuscular Tomorrow-1000  . [START ON 06/18/2016] pneumococcal 23 valent vaccine  0.5 mL Intramuscular Tomorrow-1000  . sodium chloride flush  3 mL Intravenous Q12H    Assessment: 55 yo male with CP s/p cath with PCI to LAD and plans for staged PCI to circumflex +/- RCA.  Heparin to restart 6 hrs post sheath removal (anticipated removal by ~ 2:30pm; femoral approach).  Goal of Therapy:  Heparin level 0.3-0.7 units/ml Monitor platelets by anticoagulation protocol: Yes   Plan:  -begin heparin at 11 50 units/hr at 9pm -Heparin level in 6 hours and daily wth CBC daily -Will follow plans for staged cath  Harland German, Pharm D 06/17/2016 8:39 AM

## 2016-06-17 NOTE — Progress Notes (Signed)
CARDIAC REHAB PHASE I  Spoke with RN, pt still with femoral sheath in place, needs staged PCI, will hold per RN today, will follow-up tomorrow for possible ambulation/education.   Joylene Grapes, RN, BSN 06/17/2016 1:56 PM

## 2016-06-17 NOTE — ED Provider Notes (Signed)
[facility] Emergency Department Provider Note   ____________________________________________   None    (approximate)  I have reviewed the triage vital signs and the nursing notes.   HISTORY  Chief Complaint Chest Pain    HPI Cameron [name] is a 55 y.o. male who comes into the hospital today with some chest pain. The patient reports that he went to work at 11 PM and started having some chest pain. He tried looking for some baby aspirin but was unable to find any. The patient reports that he found some coworkers who was able to give him a some ibuprofen and a BC powder. The patient reports that after midnight he could not get the pain to ease. It started getting worse and worse and started going down his left arm. He decided to come into the hospital for evaluation. The patient reports that his pain is a 4-5 out of 10 in intensity. The patient could not get the pain to ease up. He had some sweats and dizziness but no nausea no vomiting and no shortness of breath. The patient is still having some pain right now. His cardiologist is Dr.Paraschos. The patient had 2 stents placed in 2004 but he is no longer taking Plavix.    Past Medical History:  Diagnosis Date  . Coronary artery disease   . Hypertension     There are no active problems to display for this patient.   Past Surgical History:  Procedure Laterality Date  . cardiac stents      Prior to Admission medications   Not on File    Allergies Patient has no known allergies.  No family history on file.  Social History Social History  Substance Use Topics  . Smoking status: Current Every Day Smoker  . Smokeless tobacco: Never Used  . Alcohol use Yes    Review of Systems Constitutional: No fever/chills Eyes: No visual changes. ENT: No sore throat. Cardiovascular: Denies chest pain. Respiratory: Denies shortness of breath. Gastrointestinal: No abdominal pain.  No nausea, no vomiting.   No diarrhea.  No constipation. Genitourinary: Negative for dysuria. Musculoskeletal: Negative for back pain. Skin: Negative for rash. Neurological: Negative for headaches, focal weakness or numbness.  10-point ROS otherwise negative.  ____________________________________________   PHYSICAL EXAM:  VITAL SIGNS: ED Triage Vitals  Enc Vitals Group     BP 06/17/16 0245 (!) 198/132     Pulse Rate 06/17/16 0245 95     Resp 06/17/16 0245 18     Temp 06/17/16 0245 97.6 F (36.4 C)     Temp Source 06/17/16 0245 Oral     SpO2 06/17/16 0245 95 %     Weight 06/17/16 0238 205 lb (93 kg)     Height 06/17/16 0238 5\' 7"  (1.702 m)     Head Circumference --      Peak Flow --      Pain Score 06/17/16 0241 5     Pain Loc --      Pain Edu? --      Excl. in GC? --     Constitutional: Alert and oriented. Well appearing and in moderate distress. Eyes: Conjunctivae are normal. PERRL. EOMI. Head: Atraumatic. Nose: No congestion/rhinnorhea. Mouth/Throat: Mucous membranes are moist.  Oropharynx non-erythematous. Cardiovascular: Normal rate, regular rhythm. Grossly normal heart sounds.  Good peripheral circulation. Respiratory: Normal respiratory effort.  No retractions. Lungs CTAB. Gastrointestinal: Soft and nontender. No distention. Positive bowel sounds Musculoskeletal: No lower extremity tenderness nor edema.  Neurologic:  Normal speech and language.  Skin:  Skin is warm, dry and intact.  Psychiatric: Mood and affect are normal.   ____________________________________________   LABS (all labs ordered are listed, but only abnormal results are displayed)  Labs Reviewed  BASIC METABOLIC PANEL - Abnormal; Notable for the following:       Result Value   Potassium 2.9 (*)    Chloride 100 (*)    Glucose, Bld 150 (*)    All other components within normal limits  CBC - Abnormal; Notable for the following:    WBC 12.2 (*)    Hemoglobin 20.4 (*)    HCT 59.1 (*)    MCV 114.0 (*)    MCH 39.4  (*)    RDW 15.1 (*)    All other components within normal limits  TROPONIN I - Abnormal; Notable for the following:    Troponin I 0.48 (*)    All other components within normal limits  APTT  PROTIME-INR   ____________________________________________  EKG  ED ECG REPORT I, Rebecka Apley, the attending physician, personally viewed and interpreted this ECG.   Date: 06/17/2016  EKG Time: 242  Rate: 96  Rhythm: normal sinus rhythm  Axis: normal  Intervals:none  ST&T Change: none  ED ECG REPORT I, Rebecka Apley, the attending physician, personally viewed and interpreted this ECG.   Date: 06/17/2016  EKG Time: 350  Rate: 87  Rhythm: normal sinus rhythm  Axis: left axis deviation  Intervals:none  ST&T Change: ST segment elevation in leads V2, V3, with flipped t waves in leads II, III and avf   ____________________________________________  RADIOLOGY  CXR ____________________________________________   PROCEDURES  Procedure(s) performed: None  Procedures  Critical Care performed: Yes, see critical care note(s) CRITICAL CARE Performed by: Lucrezia Europe P   Total critical care time: 30 minutes  Critical care time was exclusive of separately billable procedures and treating other patients.  Critical care was necessary to treat or prevent imminent or life-threatening deterioration.  Critical care was time spent personally by me on the following activities: development of treatment plan with patient and/or surrogate as well as nursing, discussions with consultants, evaluation of patient's response to treatment, examination of patient, obtaining history from patient or surrogate, ordering and performing treatments and interventions, ordering and review of laboratory studies, ordering and review of radiographic studies, pulse oximetry and re-evaluation of patient's condition.  ____________________________________________   INITIAL IMPRESSION /  ASSESSMENT AND PLAN / ED COURSE  Pertinent labs & imaging results that were available during my care of the patient were reviewed by me and considered in my medical decision making (see chart for details).  This is a 55 year old male who comes into the hospital today with some chest pain. The patient has had some stents in the past. He reports that his pain is continuing on after some time. On his initial EKG I was concerned about V2 and V3 but it was a wandering baseline. We did repeat the EKG after some time but there was a slight delay in the repeat EKG. On the repeat EKG the patient has some significant ST segment elevations in leads V2 and V3. He also had some significant Q waves in leads V2 and V3. He has flipped T waves in leads II, III, and F aVF. I am concerned that the patient is having some ST segment elevation myocardial infarction. I contacted the [facility] cardiologist for a catheterization. I gave the patient 4  baby aspirin, heparin and some nitroglycerin. The patient said that he had some improvement in his pain with the nitroglycerin. The patient was [facility]. [facility]. The cardiologist did not want us to give Plavix or ticagrilor  Clinical Course as of Jun 17 425  Tue Jun 17, 2016  [id number] Aortic atherosclerosis. Minimal atelectasis at the left lung base. No acute cardiopulmonary disease.   DG Chest 2 View [AW]    Clinical Course User Index [AW] Rebecka ApleyAllison P Webster, MD     ____________________________________________   FINAL CLINICAL IMPRESSION(S) / ED DIAGNOSES  Final diagnoses:  Unstable angina pectoris (HCC)  ST elevation myocardial infarction involving left anterior descending (LAD) coronary artery (HCC)      NEW MEDICATIONS STARTED DURING THIS VISIT:  New Prescriptions   No medications on file     Note:  This document was prepared using Dragon voice recognition  software and may include unintentional dictation errors.    Rebecka ApleyAllison P Webster, MD 06/17/16 [phone]

## 2016-06-17 NOTE — ED Notes (Signed)
This RN attempted to call report to cath lab [phone] and [phone] no answer.

## 2016-06-17 NOTE — Progress Notes (Signed)
Sheath pulled at 1408. Pressure held for 20 minutes. Pt initial BP after pulling sbp in 70's. Turned nitro gtt down. 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 Vital signs stable now. Right groin level zero. Will continue to monitor closely.

## 2016-06-17 NOTE — ED Triage Notes (Signed)
Pt presents to ED with c/o left-sided chest pain that started last night while at work. Pt reports chest pain radiates into his left arm and was associated with diaphoresis; pt denies any c/o [facility] or N/V. Pt reports previous MI in 2004 with 2 stents being placed. Pt took IBU and BC powder without relief.

## 2016-06-17 NOTE — H&P (Signed)
    Physician History and Physical    Cameron [name] MRN: [id number] DOB/AGE: 54-Sep-1962 54 y.o. Admit date: 06/17/2016  Primary Cardiologist: [facility])  HPI: 55 yo with history of NSTEMI in 2006 and HTN presented to [facility] ER with chest pain.  Patient had NSTEMI in 2006 at [facility], sent to [facility] where he had PCI with Cypher DES to totally occluded proximal CFx as well as crush technique stenting of the 95% ostial D1 and 75% LAD.  He does not appear to have had followup with cardiology since that time.  He says that he would get chest pain "from time to time" that would resolve with taking a couple of aspirin.    Tonight, he developed central chest tightness when he went to work.  It did not resolve and lasted several hours.  Therefore, he came to ER.  In ER, he was noted to have acute anteroseptal MI on ECG and was sent to [facility] for PCI. On arrival in cath lab, pain was 4/10.   Review of systems complete and found to be negative unless listed above   Medications: ASA 81 daily  Past Medical History:  Diagnosis Date  . Coronary artery disease   . Hypertension     FH: CAD  Social History   Social History  . Marital status: Legally Separated    Spouse name: N/A  . Number of children: N/A  . Years of education: N/A   Occupational History  . Not on file.   Social History Main Topics  . Smoking status: Current Every Day Smoker  . Smokeless tobacco: Never Used  . Alcohol use Yes  . Drug use:     Types: Marijuana  . Sexual activity: Not on file   Other Topics Concern  . Not on file   Social History Narrative  . No narrative on file     No prescriptions prior to admission.    Physical Exam: There were no vitals taken for this visit.  General: NAD Neck: Thick no JVD, no thyromegaly or thyroid nodule.  Lungs: Clear to auscultation bilaterally with normal respiratory effort. CV: Nondisplaced PMI.  Heart regular S1/S2, no S3/S4, no murmur.  No peripheral edema.  No  carotid bruit.  Normal pedal pulses.  Abdomen: Soft, nontender, no hepatosplenomegaly, no distention.  Skin: Intact without lesions or rashes.  Neurologic: Alert and oriented x 3.  Psych: Normal affect. Extremities: No clubbing or cyanosis.  HEENT: Normal.   Labs:   Lab Results  Component Value Date   WBC 12.2 (H) 06/17/2016   HGB 20.4 (H) 06/17/2016   HCT 59.1 (H) 06/17/2016   MCV 114.0 (H) 06/17/2016   PLT 154 06/17/2016    Recent Labs Lab 06/17/16 0246  NA 137  K 2.9*  CL 100*  CO2 25  BUN 7  CREATININE 1.10  CALCIUM 9.1  GLUCOSE 150*   Lab Results  Component Value Date   CKTOTAL 62 11/20/2011   CKMB < 0.5 (L) 11/20/2011   TROPONINI 0.48 (HH) 06/17/2016   Radiology: - CXR: Left lung base atelectasis  EKG: NSR with acute anteroseptal MI  ASSESSMENT AND PLAN: 55 yo with history of NSTEMI in 2006 and HTN presented to [facility] ER with chest pain. He was found to have acute anteroseptal MI and sent to cath lab at [facility] for PCI.   Signed: Marca Ancona 06/17/2016, 5:05 AM

## 2016-06-18 ENCOUNTER — Inpatient Hospital Stay (HOSPITAL_COMMUNITY): Payer: Commercial Managed Care - PPO

## 2016-06-18 DIAGNOSIS — E782 Mixed hyperlipidemia: Secondary | ICD-10-CM

## 2016-06-18 DIAGNOSIS — I2510 Atherosclerotic heart disease of native coronary artery without angina pectoris: Secondary | ICD-10-CM

## 2016-06-18 DIAGNOSIS — I119 Hypertensive heart disease without heart failure: Secondary | ICD-10-CM

## 2016-06-18 LAB — BASIC METABOLIC PANEL
Anion gap: 10 (ref 5–15)
BUN: 5 mg/dL — AB (ref 6–20)
CO2: 28 mmol/L (ref 22–32)
Calcium: 8.8 mg/dL — ABNORMAL LOW (ref 8.9–10.3)
Chloride: 101 mmol/L (ref 101–111)
Creatinine, Ser: 0.96 mg/dL (ref 0.61–1.24)
GFR calc Af Amer: >60 mL/min (ref >60)
GLUCOSE: 107 mg/dL — AB (ref 65–99)
POTASSIUM: 3.9 mmol/L (ref 3.5–5.1)
Sodium: 139 mmol/L (ref 135–145)

## 2016-06-18 LAB — POCT ACTIVATED CLOTTING TIME: Activated Clotting Time: 175 seconds

## 2016-06-18 LAB — CBC
HEMATOCRIT: 52 % (ref 39.0–52.0)
Hemoglobin: 17.8 g/dL — ABNORMAL HIGH (ref 13.0–17.0)
MCH: 39.5 pg — AB (ref 26.0–34.0)
MCHC: 34.2 g/dL (ref 30.0–36.0)
MCV: 115.3 fL — AB (ref 78.0–100.0)
Platelets: 115 10*3/uL — ABNORMAL LOW (ref 150–400)
RBC: 4.51 MIL/uL (ref 4.22–5.81)
RDW: 13.9 % (ref 11.5–15.5)
WBC: 7.4 10*3/uL (ref 4.0–10.5)

## 2016-06-18 LAB — HEPARIN LEVEL (UNFRACTIONATED): Heparin Unfractionated: <0.1 IU/mL — ABNORMAL LOW (ref 0.30–0.70)

## 2016-06-18 LAB — ECHOCARDIOGRAM COMPLETE
Height: 67 in
Weight: 3280 oz

## 2016-06-18 LAB — TROPONIN I: TROPONIN I: 37.15 ng/mL — AB (ref <0.03)

## 2016-06-18 MED ORDER — CARVEDILOL 6.25 MG PO TABS
6.2500 mg | ORAL_TABLET | Freq: Two times a day (BID) | ORAL | Status: DC
  Administered 2016-06-18 – 2016-06-20 (×5): 6.25 mg via ORAL
  Filled 2016-06-18 (×5): qty 1

## 2016-06-18 MED ORDER — TICAGRELOR 90 MG PO TABS
180.0000 mg | ORAL_TABLET | Freq: Once | ORAL | Status: AC
  Administered 2016-06-18: 180 mg via ORAL
  Filled 2016-06-18: qty 2

## 2016-06-18 MED ORDER — ATORVASTATIN CALCIUM 80 MG PO TABS
80.0000 mg | ORAL_TABLET | Freq: Every day | ORAL | Status: DC
  Administered 2016-06-18 – 2016-06-19 (×2): 80 mg via ORAL
  Filled 2016-06-18 (×2): qty 1

## 2016-06-18 MED ORDER — PERFLUTREN LIPID MICROSPHERE
1.0000 mL | INTRAVENOUS | Status: AC | PRN
  Administered 2016-06-18: 2 mL via INTRAVENOUS
  Filled 2016-06-18: qty 10

## 2016-06-18 MED ORDER — PERFLUTREN LIPID MICROSPHERE
INTRAVENOUS | Status: AC
Start: 2016-06-18 — End: 2016-06-18
  Filled 2016-06-18: qty 10

## 2016-06-18 MED ORDER — TICAGRELOR 90 MG PO TABS
90.0000 mg | ORAL_TABLET | Freq: Two times a day (BID) | ORAL | Status: DC
  Administered 2016-06-18 – 2016-06-19 (×2): 90 mg via ORAL
  Filled 2016-06-18 (×2): qty 1

## 2016-06-18 MED FILL — Clopidogrel Bisulfate Tab 75 MG (Base Equiv): ORAL | Qty: 6 | Status: AC

## 2016-06-18 NOTE — Progress Notes (Signed)
Patient Name: Angelica ChessmanJeffrey R Mattox Date of Encounter: 06/18/2016  Primary Cardiologist: [facility]  Hospital Problem List     Active Problems:   STEMI involving oth coronary artery of anterior wall (HCC)   STEMI (ST elevation myocardial infarction) (HCC)     Subjective   No chest pain.  He want sto shower.  He has walked to the bathroom without difficulty.  No bleeding issues.    Inpatient Medications    Scheduled Meds: . aspirin  81 mg Oral Daily  . atorvastatin  80 mg Oral q1800  . carvedilol  6.25 mg Oral BID WC  . Influenza vac split quadrivalent PF  0.5 mL Intramuscular Tomorrow-1000  . pneumococcal 23 valent vaccine  0.5 mL Intramuscular Tomorrow-1000  . sodium chloride flush  3 mL Intravenous Q12H  . ticagrelor  180 mg Oral Once  . [START ON 06/19/2016] ticagrelor  90 mg Oral BID   Continuous Infusions: . nitroGLYCERIN Stopped (06/17/16 1400)   PRN Meds: sodium chloride, acetaminophen, hydrALAZINE, labetalol, morphine injection, ondansetron (ZOFRAN) IV, perflutren lipid microspheres (DEFINITY) IV suspension, sodium chloride flush   Vital Signs    Vitals:   06/18/16 0600 06/18/16 0700 06/18/16 0800 06/18/16 0859  BP: 137/77 (!) 146/102 (!) 145/103   Pulse:      Resp: (!) 22 13 (!) 9   Temp:    98.3 F (36.8 C)  TempSrc:    Oral  SpO2: 95%       Intake/Output Summary (Last 24 hours) at 06/18/16 0859 Last data filed at 06/18/16 0800  Gross per 24 hour  Intake          1689.41 ml  Output              300 ml  Net          1389.41 ml   There were no vitals filed for this visit.  Physical Exam   GEN: Well nourished, well developed, in no acute distress.  HEENT: Grossly normal.  Neck: Supple, no JVD, carotid bruits, or masses. Cardiac: RRR, no murmurs, rubs, or gallops. No clubbing, cyanosis, edema.  Radials/DP/PT 2+ and equal bilaterally.  Respiratory:  Respirations regular and unlabored, clear to auscultation bilaterally. GI: Soft, nontender,  nondistended, BS + x 4. MS: no deformity or atrophy. Skin: warm and dry, no rash.  No right groin hematoma. 2+ right PT pulse Neuro:  Strength and sensation are intact. Psych: AAOx3.  Normal affect.  Labs    CBC  Recent Labs  06/17/16 0246 06/18/16 0228  WBC 12.2* 7.4  HGB 20.4* 17.8*  HCT 59.1* 52.0  MCV 114.0* 115.3*  PLT 154 115*   Basic Metabolic Panel  Recent Labs  06/17/16 0246 06/18/16 0228  NA 137 139  K 2.9* 3.9  CL 100* 101  CO2 25 28  GLUCOSE 150* 107*  BUN 7 5*  CREATININE 1.10 0.96  CALCIUM 9.1 8.8*   Liver Function Tests No results for input(s): AST, ALT, ALKPHOS, BILITOT, PROT, ALBUMIN in the last 72 hours. No results for input(s): LIPASE, AMYLASE in the last 72 hours. Cardiac Enzymes  Recent Labs  06/17/16 0246 06/17/16 0620 06/17/16 1910  TROPONINI 0.48* >65.00* >65.00*   BNP Invalid input(s): POCBNP D-Dimer No results for input(s): DDIMER in the last 72 hours. Hemoglobin A1C No results for input(s): HGBA1C in the last 72 hours. Fasting Lipid Panel No results for input(s): CHOL, HDL, LDLCALC, TRIG, CHOLHDL, LDLDIRECT in the last 72 hours. Thyroid Function Tests No  results for input(s): TSH, T4TOTAL, T3FREE, THYROIDAB in the last 72 hours.  Invalid input(s): FREET3  Telemetry    NSR - Personally Reviewed  ECG    NSR, changes indicative of evolving anterior MI  - Personally Reviewed  Radiology    Dg Chest 2 View  Result Date: 06/17/2016 CLINICAL DATA:  Left-sided chest pain starting last night. EXAM: CHEST  2 VIEW COMPARISON:  03/26/2014 FINDINGS: Heart and mediastinal contours within normal limits. There is aortic atherosclerosis. Minimal atelectasis at the left lung base. No pneumonic consolidation or CHF. No effusion or pneumothorax. No suspicious osseous lesions. Degenerative changes are noted along the dorsal spine as before. IMPRESSION: Aortic atherosclerosis. Minimal atelectasis at the left lung base. No acute  cardiopulmonary disease. Electronically Signed   By: Tollie Eth M.D.   On: 06/17/2016 03:17    Cardiac Studies   Cath films reviewed with Dr. Allyson Sabal  Patient Profile     55 y/o with multivessel CAD.    Assessment & Plan    1) Acute occlusion was of the LAD.  S/p revasc with occluded diag, circ and moderate to severe disease in the distal RCA.  Plan for aggressive medical therapy for his LV dysfunction.  WIll plan to move to tele.  Encourage him to walk in the halls.  I discussed his anatomy with Dr. Allyson Sabal.  Will plan on medical therapy at this point with plans for PCI of RCA if needed in the future.    2) HTN: Start Coreg given MI.  WIll also add ACE-I if BP stays up given LV dysfunction.    3) Hyperlipidemia:  Start atorvastatin 80 mg daily.  Check fasting lipids in AM.  Transfer to tele today.  Plan for discharge Friday.  Could consider discharge tomorrow afternoon if he is doing well.    He will need f/u in [place].    Signed, Lance Muss, MD  06/18/2016, 8:59 AM

## 2016-06-18 NOTE — Progress Notes (Signed)
  Echocardiogram 2D Echocardiogram has been performed.  Cameron [name] 06/18/2016, 8:57 AM

## 2016-06-18 NOTE — Progress Notes (Signed)
ANTICOAGULATION CONSULT NOTE - Follow-up Consult  Pharmacy Consult for heparin Indication: chest pain/ACS  No Known Allergies  Patient Measurements: 5' 7'' 93kg Heparin Dosing Weight: 85kg IBW: 66kg  Vital Signs: Temp: 98.6 F (37 C) (12/13 0000) Temp Source: Oral (12/13 0000) BP: 146/106 (12/13 0100) Pulse Rate: 82 (12/12 1830)  Labs:  Recent Labs  06/17/16 0246 06/17/16 0419 06/17/16 0620 06/17/16 1910 06/18/16 0228  HGB 20.4*  --   --   --  17.8*  HCT 59.1*  --   --   --  52.0  PLT 154  --   --   --  115*  LABPROT  --  14.1  --   --   --   INR  --  1.09  --   --   --   HEPARINUNFRC  --   --   --   --  <0.10*  CREATININE 1.10  --   --   --  0.96  TROPONINI 0.48*  --  >65.00* >65.00*  --     Estimated Creatinine Clearance: 95.7 mL/min (by C-G formula based on SCr of 0.96 mg/dL).   Assessment: 55 yo male with CP s/p cath with PCI to LAD and plans for staged PCI to circumflex +/- RCA.  Heparin restarted 6 hrs post sheath removal 12/12. Heparin level undetectable on 1150 units/hr. Hgb, Hct down but still wnl. Plt down to 115. No issues with line or bleeding reported per RN.  Goal of Therapy:  Heparin level 0.3-0.7 units/ml Monitor platelets by anticoagulation protocol: Yes   Plan:  Increase heparin to 1500 units/hr Will f/u heparin level in 6 hours   Christoper Fabian, PharmD, BCPS Clinical pharmacist, pager [phone]  06/18/2016 4:32 AM

## 2016-06-18 NOTE — Care Management Note (Signed)
Case Management Note  Patient Details  Name: Cameron [name] MRN: [id number] Date of Birth: 1961/02/18  Subjective/Objective:     Adm w mi              Action/Plan: lives at home   Expected Discharge Date:                  Expected Discharge Plan:  Home/Self Care  In-House Referral:     Discharge planning Services  CM Consult, Medication Assistance  Post Acute Care Choice:    Choice offered to:     DME Arranged:    DME Agency:     HH Arranged:    HH Agency:     Status of Service:  Completed, signed off  If discussed at [employer], dates discussed:    Additional Comments: gave pt 30day free and copay card for brilinta.  Hanley Hays, RN 06/18/2016, 9:56 AM

## 2016-06-18 NOTE — Progress Notes (Signed)
Called Cath lab about missing stent card. Will continue to follow up.

## 2016-06-18 NOTE — Progress Notes (Addendum)
CARDIAC REHAB PHASE I   PRE:  Rate/Rhythm: 109 ST  BP:  Supine:   Sitting: 150/89, 136/96  Standing:    SaO2: 96%RA  MODE:  Ambulation: 740 ft   POST:  Rate/Rhythm: 115 ST  BP:  Supine:   Sitting: 126/104, 148/108  Standing:    SaO2: 98%RA 0950-1052 Pt walked 740 ft with steady gait. No CP and tolerated well. MI education began. Discussed importance of brilinta with stent. Has brilinta card. Does not have stent card. Notified RN. Reviewed NTG use, MI restrictions, smoking cessation, ex ed and CRP 2. Will refer to [facility] Phase 2. Gave pt smoking cessation handout and offered fake cigarette. Did not want the fake cigarette. We discussed nicotine patches but pt stated he would like to quit as his father did which was cold [place]. Will continue ed tomorrow after ECHO results available and lab work(A1C and lipid panel). BP elevated prior and after walk.   Luetta Nutting, RN BSN  06/18/2016 10:45 AM

## 2016-06-19 DIAGNOSIS — I513 Intracardiac thrombosis, not elsewhere classified: Secondary | ICD-10-CM

## 2016-06-19 LAB — LIPID PANEL
CHOLESTEROL: 144 mg/dL (ref 0–200)
HDL: 28 mg/dL — AB (ref >40)
LDL Cholesterol: 82 mg/dL (ref 0–99)
TRIGLYCERIDES: 171 mg/dL — AB (ref <150)
Total CHOL/HDL Ratio: 5.1 RATIO
VLDL: 34 mg/dL (ref 0–40)

## 2016-06-19 MED ORDER — APIXABAN 5 MG PO TABS
5.0000 mg | ORAL_TABLET | Freq: Two times a day (BID) | ORAL | Status: DC
  Administered 2016-06-19 – 2016-06-20 (×3): 5 mg via ORAL
  Filled 2016-06-19 (×3): qty 1

## 2016-06-19 MED ORDER — CLOPIDOGREL BISULFATE 75 MG PO TABS
300.0000 mg | ORAL_TABLET | Freq: Once | ORAL | Status: AC
  Administered 2016-06-19: 300 mg via ORAL
  Filled 2016-06-19: qty 4

## 2016-06-19 MED ORDER — CLOPIDOGREL BISULFATE 75 MG PO TABS
75.0000 mg | ORAL_TABLET | Freq: Every day | ORAL | Status: DC
  Administered 2016-06-20: 75 mg via ORAL
  Filled 2016-06-19: qty 1

## 2016-06-19 MED ORDER — BUPIVACAINE-EPINEPHRINE (PF) 0.25% -1:200000 IJ SOLN
INTRAMUSCULAR | Status: AC
  Filled 2016-06-19: qty 30

## 2016-06-19 MED ORDER — LISINOPRIL 10 MG PO TABS
10.0000 mg | ORAL_TABLET | Freq: Every day | ORAL | Status: DC
  Administered 2016-06-19 – 2016-06-20 (×2): 10 mg via ORAL
  Filled 2016-06-19 (×2): qty 1

## 2016-06-19 NOTE — Progress Notes (Signed)
Patient Name: Angelica ChessmanJeffrey R Gaal Date of Encounter: 06/19/2016  Primary Cardiologist: [facility]([place])  Hospital Problem List     Active Problems:   STEMI involving oth coronary artery of anterior wall (HCC)   STEMI (ST elevation myocardial infarction) (HCC)     Subjective   No chest pain.  He want sto shower.  He has walked to the bathroom without difficulty.  No bleeding issues.  Discussed apical thrombus issue noted on echo.  Apex akinetic.    Inpatient Medications    Scheduled Meds: . apixaban  5 mg Oral BID  . aspirin  81 mg Oral Daily  . atorvastatin  80 mg Oral q1800  . carvedilol  6.25 mg Oral BID WC  . clopidogrel  300 mg Oral Once  . [START ON 06/20/2016] clopidogrel  75 mg Oral Daily  . lisinopril  10 mg Oral Daily  . sodium chloride flush  3 mL Intravenous Q12H   Continuous Infusions: . nitroGLYCERIN Stopped (06/17/16 1400)   PRN Meds: sodium chloride, acetaminophen, hydrALAZINE, labetalol, morphine injection, ondansetron (ZOFRAN) IV, sodium chloride flush   Vital Signs    Vitals:   06/19/16 0721 06/19/16 0800 06/19/16 0900 06/19/16 1000  BP:  (!) 150/101 125/89 121/83  Pulse:  93    Resp:  (!) 21  (!) 40  Temp: 97.6 F (36.4 C)     TempSrc: Oral     SpO2:  98%      Intake/Output Summary (Last 24 hours) at 06/19/16 1136 Last data filed at 06/19/16 0400  Gross per 24 hour  Intake              360 ml  Output                0 ml  Net              360 ml   There were no vitals filed for this visit.  Physical Exam   GEN: Well nourished, well developed, in no acute distress.  HEENT: Grossly normal.  Neck: Supple, no JVD, carotid bruits, or masses. Cardiac: RRR, no murmurs, rubs, or gallops. No clubbing, cyanosis, edema.  Radials/DP/PT 2+ and equal bilaterally.  Respiratory:  Respirations regular and unlabored, clear to auscultation bilaterally. GI: Soft, nontender, nondistended, BS + x 4. MS: no deformity or atrophy. Skin: warm and dry, no  rash.  No right groin hematoma. 2+ right PT pulse Neuro:  Strength and sensation are intact. Psych: AAOx3.  Normal affect.  Labs    CBC  Recent Labs  06/17/16 0246 06/18/16 0228  WBC 12.2* 7.4  HGB 20.4* 17.8*  HCT 59.1* 52.0  MCV 114.0* 115.3*  PLT 154 115*   Basic Metabolic Panel  Recent Labs  06/17/16 0246 06/18/16 0228  NA 137 139  K 2.9* 3.9  CL 100* 101  CO2 25 28  GLUCOSE 150* 107*  BUN 7 5*  CREATININE 1.10 0.96  CALCIUM 9.1 8.8*   Liver Function Tests No results for input(s): AST, ALT, ALKPHOS, BILITOT, PROT, ALBUMIN in the last 72 hours. No results for input(s): LIPASE, AMYLASE in the last 72 hours. Cardiac Enzymes  Recent Labs  06/17/16 0620 06/17/16 1910 06/18/16 0856  TROPONINI >65.00* >65.00* 37.15*   BNP Invalid input(s): POCBNP D-Dimer No results for input(s): DDIMER in the last 72 hours. Hemoglobin A1C No results for input(s): HGBA1C in the last 72 hours. Fasting Lipid Panel  Recent Labs  06/19/16 0207  CHOL 144  HDL 28*  LDLCALC 82  TRIG 171*  CHOLHDL 5.1   Thyroid Function Tests No results for input(s): TSH, T4TOTAL, T3FREE, THYROIDAB in the last 72 hours.  Invalid input(s): FREET3  Telemetry    NSR - Personally Reviewed  ECG    NSR, changes indicative of evolving anterior MI  - Personally Reviewed  Radiology    No results found.  Cardiac Studies   Cath films reviewed with Dr. Allyson Sabal, residual CTO of the circ and moderate to severe RCA disease.  Patient Profile     55 y/o with multivessel CAD.    Assessment & Plan    1) Acute occlusion was of the LAD.  S/p revasc with occluded diag, circ and moderate to severe disease in the distal RCA.  Plan for aggressive medical therapy for his LV dysfunction.  WIll plan to move to tele.  Encourage him to walk in the halls.  I discussed his anatomy with Dr. Allyson Sabal.  Will plan on medical therapy at this point with plans for PCI of RCA if needed in the future.    2) HTN:  Started Coreg given MI.  Will also add ACE-I today given LV dysfunction.    3) Hyperlipidemia:  Start atorvastatin 80 mg daily.  Check fasting lipids in AM.  Start Eliquis for apical thrombus.  He will take it for 3 months.  Stop aspirin in 2-4 weeks.   Transfer to tele today.  Plan for discharge Friday.     He will need f/u in [place].    Signed, Lance Muss, MD  06/19/2016, 11:36 AM

## 2016-06-19 NOTE — Progress Notes (Signed)
CARDIAC REHAB PHASE I   PRE:  Rate/Rhythm: 80 SR    BP: sitting 121/83    SaO2:   MODE:  Ambulation: 700 ft   POST:  Rate/Rhythm: 100 ST    BP: sitting 128/85     SaO2:   Tolerated well, no c/o. Second walk today. Discussed HF management, low sodium, smoking cessation with pt. Good comprehension.  [id number]   Harriet Masson CES, ACSM 06/19/2016 10:42 AM

## 2016-06-19 NOTE — Progress Notes (Signed)
Stent card and book given to patient.

## 2016-06-20 ENCOUNTER — Encounter (HOSPITAL_COMMUNITY): Payer: Self-pay | Admitting: Cardiology

## 2016-06-20 DIAGNOSIS — I10 Essential (primary) hypertension: Secondary | ICD-10-CM

## 2016-06-20 DIAGNOSIS — I255 Ischemic cardiomyopathy: Secondary | ICD-10-CM

## 2016-06-20 DIAGNOSIS — I2510 Atherosclerotic heart disease of native coronary artery without angina pectoris: Secondary | ICD-10-CM

## 2016-06-20 DIAGNOSIS — E785 Hyperlipidemia, unspecified: Secondary | ICD-10-CM

## 2016-06-20 LAB — BASIC METABOLIC PANEL
ANION GAP: 6 (ref 5–15)
BUN: 14 mg/dL (ref 6–20)
CALCIUM: 8.5 mg/dL — AB (ref 8.9–10.3)
CO2: 28 mmol/L (ref 22–32)
Chloride: 101 mmol/L (ref 101–111)
Creatinine, Ser: 1.21 mg/dL (ref 0.61–1.24)
GFR calc Af Amer: >60 mL/min (ref >60)
GLUCOSE: 103 mg/dL — AB (ref 65–99)
Potassium: 3.8 mmol/L (ref 3.5–5.1)
SODIUM: 135 mmol/L (ref 135–145)

## 2016-06-20 LAB — HEMOGLOBIN A1C
HEMOGLOBIN A1C: 5.4 % (ref 4.8–5.6)
Mean Plasma Glucose: 108 mg/dL

## 2016-06-20 MED ORDER — ATORVASTATIN CALCIUM 80 MG PO TABS: 80.0000 mg | ORAL_TABLET | Freq: Every day | ORAL | 6 refills | Status: DC

## 2016-06-20 MED ORDER — CARVEDILOL 6.25 MG PO TABS: 6.2500 mg | ORAL_TABLET | Freq: Two times a day (BID) | ORAL | 6 refills | Status: DC

## 2016-06-20 MED ORDER — APIXABAN 5 MG PO TABS: 5.0000 mg | ORAL_TABLET | Freq: Two times a day (BID) | ORAL | 2 refills | Status: DC

## 2016-06-20 MED ORDER — NITROGLYCERIN 0.4 MG SL SUBL
0.4000 mg | SUBLINGUAL_TABLET | SUBLINGUAL | 3 refills | Status: DC | PRN
Start: 2016-06-20 — End: 2020-08-19

## 2016-06-20 MED ORDER — FUROSEMIDE 20 MG PO TABS: 20.0000 mg | ORAL_TABLET | Freq: Every day | ORAL | 2 refills | Status: DC | PRN

## 2016-06-20 MED ORDER — LISINOPRIL 10 MG PO TABS: 10.0000 mg | ORAL_TABLET | Freq: Every day | ORAL | 6 refills | Status: DC

## 2016-06-20 MED ORDER — CLOPIDOGREL BISULFATE 75 MG PO TABS: 75.0000 mg | ORAL_TABLET | Freq: Every day | ORAL | 11 refills | Status: DC

## 2016-06-20 NOTE — Discharge Summary (Signed)
Discharge Summary    Patient ID: FATIH SERRATORE,  MRN: [id number], DOB/AGE: 54-Feb-1962 54 y.o.  Admit date: 06/17/2016 Discharge date: 06/20/2016  Primary Care Provider: No PCP Per Patient Primary Cardiologist: New (Dr. [name]) with [facility]  Discharge Diagnoses    Active Problems:   STEMI involving oth coronary artery of anterior wall [facility])   STEMI (ST elevation myocardial infarction) (HCC)   CAD (coronary artery disease), native coronary artery   Essential hypertension   Hyperlipidemia   Allergies No Known Allergies  Diagnostic Studies/Procedures    LHC: 06/17/16  Conclusion     Prox Cx to Mid Cx lesion, 100 %stenosed.  Ost LAD to Mid LAD lesion, 100 %stenosed.  Ost 1st Diag to 1st Diag lesion, 100 %stenosed.  Ost RCA to Prox RCA lesion, 60 %stenosed.  Mid RCA lesion, 50 %stenosed.  Dist RCA lesion, 75 %stenosed.  There is moderate to severe left ventricular systolic dysfunction.  LV end diastolic pressure is mildly elevated.  The left ventricular ejection fraction is 25-35% by visual estimate.   IMPRESSION: Successful proximal LAD PCI and drug-eluting stenting in the setting of an anterior STEMI with a door to balloon time was 35 minutes. He remained stable throughout the case. I use Plavix in the event that surgery was anticipated although we can easily transitioned to [place]. I believe he'll need staged circumflex plus or minus RCA intervention prior to discharge. He left the lab in stable condition  Diagnostic Diagram      TTE: 06/18/16  Study Conclusions  - Left ventricle: Cannot r/o small mural apical thrombus consider   cardiac MRI to further evaluate if clinically indicated. Septal   apical inferior and distal anterior wall hypokinesis. The cavity   size was severely dilated. There was mild concentric hypertrophy.   Systolic function was severely reduced. The estimated ejection   fraction was in the range of 25% to  30%. - Atrial septum: No defect or patent foramen ovale was identified. _____________   History of Present Illness     55 yo with history of NSTEMI in 2006 and HTN presented to [facility] ER with chest pain. Patient had NSTEMI in 2006 at [facility], sent to [facility] where he had PCI with Cypher DES to totally occluded proximal CFx as well as crush technique stenting of the 95% ostial D1 and 75% LAD.  He did not appear to have had followup with cardiology since that time.  He said that he would get chest pain "from time to time" that would resolve with taking a couple of aspirin.    On 12/12, he developed central chest tightness when he went to work.  It did not resolve and lasted several hours.  Therefore, he came to ER.  In ER, he was noted to have acute anteroseptal MI on ECG and was sent to [facility] for PCI. On arrival in cath lab, pain was 4/10.   Hospital Course     Consultants: None  He underwent LHC with Dr. Allyson Sabal showing 100% prox-mid Cx, 100% ost-mid LAD, 100%  Ost 1st diag-1st diag, 60% pRCA, 50% mRCA, 75% dRCA. Had successful pLAD PCI with DESx1. He was started on ASA and Plavix. Planned for medical management of residual disease, with plans for . Follow up echo showed showed concern for apical thrombus, with EF of 25-30%. He was started on Eliquis 5mg  BID. He will need ASA, Eliquis and Plavix. Plan to stop ASA in 2-4 weeks, stop Eliquis after 3 months  and then continue on plavix indefinitely.   He worked well with cardiac rehab, no further chest pain or dyspnea. He was started on Coreg 6.25mg  BID, and lisinopril 10mg  daily given his reduced LV dysfunction. He remained euvolemic on exam. Will be discharged home with PRN dose of Lasix.   He was seen and examined by Dr. Eldridge Dace on 12/15 and determined stable for discharge home. I have arranged for follow up in [place] with [facility] per patient request.  _____________  Discharge Vitals Blood pressure 120/71, pulse 76, temperature 97.8 F (36.6  C), resp. rate 16, weight 208 lb 14.4 oz (94.8 kg), SpO2 97 %.  Filed Weights   06/20/16 0400  Weight: 208 lb 14.4 oz (94.8 kg)    Labs & Radiologic Studies    CBC  Recent Labs  06/18/16 0228  WBC 7.4  HGB 17.8*  HCT 52.0  MCV 115.3*  PLT 115*   Basic Metabolic Panel  Recent Labs  06/18/16 0228 06/20/16 0311  NA 139 135  K 3.9 3.8  CL 101 101  CO2 28 28  GLUCOSE 107* 103*  BUN 5* 14  CREATININE 0.96 1.21  CALCIUM 8.8* 8.5*   Liver Function Tests No results for input(s): AST, ALT, ALKPHOS, BILITOT, PROT, ALBUMIN in the last 72 hours. No results for input(s): LIPASE, AMYLASE in the last 72 hours. Cardiac Enzymes  Recent Labs  06/17/16 1910 06/18/16 0856  TROPONINI >65.00* 37.15*   BNP Invalid input(s): POCBNP D-Dimer No results for input(s): DDIMER in the last 72 hours. Hemoglobin A1C  Recent Labs  06/19/16 0207  HGBA1C 5.4   Fasting Lipid Panel  Recent Labs  06/19/16 0207  CHOL 144  HDL 28*  LDLCALC 82  TRIG 161*  CHOLHDL 5.1   Thyroid Function Tests No results for input(s): TSH, T4TOTAL, T3FREE, THYROIDAB in the last 72 hours.  Invalid input(s): FREET3 _____________  Dg Chest 2 View  Result Date: 06/17/2016 CLINICAL DATA:  Left-sided chest pain starting last night. EXAM: CHEST  2 VIEW COMPARISON:  03/26/2014 FINDINGS: Heart and mediastinal contours within normal limits. There is aortic atherosclerosis. Minimal atelectasis at the left lung base. No pneumonic consolidation or CHF. No effusion or pneumothorax. No suspicious osseous lesions. Degenerative changes are noted along the dorsal spine as before. IMPRESSION: Aortic atherosclerosis. Minimal atelectasis at the left lung base. No acute cardiopulmonary disease. Electronically Signed   By: Tollie Eth M.D.   On: 06/17/2016 03:17   Disposition   Pt is being discharged home today in good condition.  Follow-up Plans & Appointments    Follow-up Information    Lamar Blinks, MD  Follow up on 06/26/2016.   Specialty:  Cardiology Why:  at 9:30am for your follow up appt.  Contact information: [address] [facility] [place] [address] [phone]          Discharge Instructions    (HEART FAILURE PATIENTS) Call MD:  Anytime you have any of the following symptoms: 1) 3 pound weight gain in 24 hours or 5 pounds in 1 week 2) shortness of breath, with or without a dry hacking cough 3) swelling in the hands, feet or stomach 4) if you have to sleep on extra pillows at night in order to breathe.    Complete by:  As directed    AMB Referral to Cardiac Rehabilitation - Phase II    Complete by:  As directed    Diagnosis:  STEMI   Amb Referral to Cardiac  Rehabilitation    Complete by:  As directed    Diagnosis:   STEMI Coronary Stents     Diet - low sodium heart healthy    Complete by:  As directed    Discharge instructions    Complete by:  As directed    Groin Site Care Refer to this sheet in the next few weeks. These instructions provide you with information on caring for yourself after your procedure. Your caregiver may also give you more specific instructions. Your treatment has been planned according to current medical practices, but problems sometimes occur. Call your caregiver if you have any problems or questions after your procedure. HOME CARE INSTRUCTIONS You may shower 24 hours after the procedure. Remove the bandage (dressing) and gently wash the site with plain soap and water. Gently pat the site dry.  Do not apply powder or lotion to the site.  Do not sit in a bathtub, swimming pool, or whirlpool for 5 to 7 days.  No bending, squatting, or lifting anything over 10 pounds (4.5 kg) as directed by your caregiver.  Inspect the site at least twice daily.  Do not drive home if you are discharged the same day of the procedure. Have someone else drive you.  You may drive 24 hours after the procedure unless otherwise instructed by  your caregiver.  What to expect: Any bruising will usually fade within 1 to 2 weeks.  Blood that collects in the tissue (hematoma) may be painful to the touch. It should usually decrease in size and tenderness within 1 to 2 weeks.  SEEK IMMEDIATE MEDICAL CARE IF: You have unusual pain at the groin site or down the affected leg.  You have redness, warmth, swelling, or pain at the groin site.  You have drainage (other than a small amount of blood on the dressing).  You have chills.  You have a fever or persistent symptoms for more than 72 hours.  You have a fever and your symptoms suddenly get worse.  Your leg becomes pale, cool, tingly, or numb.  You have heavy bleeding from the site. Hold pressure on the site. .  Please take aspirin 81mg  for the next 2-4 weeks then stop. You will need to take Eliquis for 3 months and then stop for the clot noted on your heart ultrasound. You will on plavix daily from now on. I have arranged an appt in [place] for you. Please bring all of your medications to that visit. Please call the office if you have any bleeding issues.   Increase activity slowly    Complete by:  As directed       Discharge Medications   Current Discharge Medication List    START taking these medications   Details  apixaban (ELIQUIS) 5 MG TABS tablet Take 1 tablet (5 mg total) by mouth 2 (two) times daily. Qty: 60 tablet, Refills: 2    atorvastatin (LIPITOR) 80 MG tablet Take 1 tablet (80 mg total) by mouth daily at 6 PM. Qty: 30 tablet, Refills: 6    carvedilol (COREG) 6.25 MG tablet Take 1 tablet (6.25 mg total) by mouth 2 (two) times daily with a meal. Qty: 60 tablet, Refills: 6    clopidogrel (PLAVIX) 75 MG tablet Take 1 tablet (75 mg total) by mouth daily. Qty: 30 tablet, Refills: 11    furosemide (LASIX) 20 MG tablet Take 1 tablet (20 mg total) by mouth daily as needed. Take 1 tablet for 2lb weight gain in 1 day,  or 3-5 lbs in 3 days. Qty: 30 tablet, Refills: 2     lisinopril (PRINIVIL,ZESTRIL) 10 MG tablet Take 1 tablet (10 mg total) by mouth daily. Qty: 30 tablet, Refills: 6    nitroGLYCERIN (NITROSTAT) 0.4 MG SL tablet Place 1 tablet (0.4 mg total) under the tongue every 5 (five) minutes as needed. Qty: 25 tablet, Refills: 3      CONTINUE these medications which have NOT CHANGED   Details  aspirin EC 81 MG tablet Take 81 mg by mouth daily.         Aspirin prescribed at discharge?  Yes High Intensity Statin Prescribed? (Lipitor 40-80mg  or Crestor 20-40mg ): Yes Beta Blocker Prescribed? Yes For EF <40%, was ACEI/ARB Prescribed? Yes ADP Receptor Inhibitor Prescribed? (i.e. Plavix etc.-Includes Medically Managed Patients): Yes For EF <40%, Aldosterone Inhibitor Prescribed? No: consider at follow up Was EF assessed during THIS hospitalization? Yes Was Cardiac Rehab II ordered? (Included Medically managed Patients): Yes   Outstanding Labs/Studies   BMET at follow up visit, LFTs and FLP if able to tolerate statin. Echo in 6 weeks.   Duration of Discharge Encounter   Greater than 30 minutes including physician time.  Signed, Laverda Page NP-C 06/20/2016, 11:04 AM   I have examined the patient and reviewed assessment and plan and discussed with patient.  Agree with above as stated.    1) Acute occlusion was of the LAD.  S/p revasc with occluded diag, circ and moderate to severe disease in the distal RCA.  Plan for aggressive medical therapy for his LV dysfunction.  No angina when he walks in the halls.  I discussed his anatomy with Dr. Allyson Sabal.  Will plan on medical therapy at this point with plans for PCI of RCA if needed in the future.  He prefers to f/u with Dr. [name].    2) HTN: Started Coreg 6.25 mg BID given MI. Added Lisinopril 10 mg daily given LV dysfunction.    3) Hyperlipidemia:  Start atorvastatin 80 mg daily.  Check fasting lipids in AM.  Start Eliquis for apical thrombus.  He will take it for 3 months.  Stop aspirin in  4 weeks (could stop earlier if there are any bleeding problems.)  He should continue plavix indefinitely.  Appears euvolemic.  Not receiving diuretics.  WIll give an RX for prn Lasix to use for swelling or shortness of breath.  Discharge today.     He will need f/u in [place].  He wants to f/u with Dr. [name] with the [facility].  Lance Muss

## 2016-06-20 NOTE — Progress Notes (Addendum)
Patient Name: Cameron [name] Date of Encounter: 06/20/2016  Primary Cardiologist: [facility]([place])  Hospital Problem List     Active Problems:   STEMI involving oth coronary artery of anterior wall (HCC)   STEMI (ST elevation myocardial infarction) (HCC)     Subjective   No chest pain.  He want sto shower.  He has walked to the bathroom without difficulty.  No bleeding issues.  Discussed apical thrombus issue noted on echo.  Apex akinetic.    Inpatient Medications    Scheduled Meds: . apixaban  5 mg Oral BID  . aspirin  81 mg Oral Daily  . atorvastatin  80 mg Oral q1800  . carvedilol  6.25 mg Oral BID WC  . clopidogrel  75 mg Oral Daily  . lisinopril  10 mg Oral Daily  . sodium chloride flush  3 mL Intravenous Q12H   Continuous Infusions: . nitroGLYCERIN Stopped (06/17/16 1400)   PRN Meds: sodium chloride, acetaminophen, hydrALAZINE, labetalol, morphine injection, ondansetron (ZOFRAN) IV, sodium chloride flush   Vital Signs    Vitals:   06/19/16 1735 06/19/16 2119 06/20/16 0400 06/20/16 0815  BP: 106/77 92/69 113/69 (!) 136/91  Pulse: 76 67 62 76  Resp: 16 18 16    Temp:  97.8 F (36.6 C) 97.8 F (36.6 C)   TempSrc:      SpO2: 95% 95% 97%   Weight:   208 lb 14.4 oz (94.8 kg)     Intake/Output Summary (Last 24 hours) at 06/20/16 0835 Last data filed at 06/20/16 0600  Gross per 24 hour  Intake              300 ml  Output                0 ml  Net              300 ml   Filed Weights   06/20/16 0400  Weight: 208 lb 14.4 oz (94.8 kg)    Physical Exam   GEN: Well nourished, well developed, in no acute distress.  HEENT: Grossly normal.  Neck: Supple, no JVD, carotid bruits, or masses. Cardiac: RRR, no murmurs, rubs, or gallops. No clubbing, cyanosis, edema.  Radials/DP/PT 2+ and equal bilaterally.  Respiratory:  Respirations regular and unlabored, clear to auscultation bilaterally. GI: Soft, nontender, nondistended, BS + x 4. MS: no deformity or  atrophy. Skin: warm and dry, no rash.  No right groin hematoma. 2+ right DP pulse; more pronounced right groin bruising but there area is soft Neuro:  Strength and sensation are intact. Psych: AAOx3.  Normal affect.  Labs    CBC  Recent Labs  06/18/16 0228  WBC 7.4  HGB 17.8*  HCT 52.0  MCV 115.3*  PLT 115*   Basic Metabolic Panel  Recent Labs  06/18/16 0228 06/20/16 0311  NA 139 135  K 3.9 3.8  CL 101 101  CO2 28 28  GLUCOSE 107* 103*  BUN 5* 14  CREATININE 0.96 1.21  CALCIUM 8.8* 8.5*   Liver Function Tests No results for input(s): AST, ALT, ALKPHOS, BILITOT, PROT, ALBUMIN in the last 72 hours. No results for input(s): LIPASE, AMYLASE in the last 72 hours. Cardiac Enzymes  Recent Labs  06/17/16 1910 06/18/16 0856  TROPONINI >65.00* 37.15*   BNP Invalid input(s): POCBNP D-Dimer No results for input(s): DDIMER in the last 72 hours. Hemoglobin A1C  Recent Labs  06/19/16 0207  HGBA1C 5.4   Fasting Lipid Panel  Recent  Labs  06/19/16 0207  CHOL 144  HDL 28*  LDLCALC 82  TRIG 115*  CHOLHDL 5.1   Thyroid Function Tests No results for input(s): TSH, T4TOTAL, T3FREE, THYROIDAB in the last 72 hours.  Invalid input(s): FREET3  Telemetry    NSR - Personally Reviewed  ECG    NSR, changes indicative of evolving anterior MI  - Personally Reviewed  Radiology    No results found.  Cardiac Studies   Cath films reviewed with Dr. Allyson Sabal, residual CTO of the circ and moderate to severe RCA disease.  Patient Profile     55 y/o with multivessel CAD.    Assessment & Plan    1) Acute occlusion was of the LAD.  S/p revasc with occluded diag, circ and moderate to severe disease in the distal RCA.  Plan for aggressive medical therapy for his LV dysfunction.  No angina when he walks in the halls.  I discussed his anatomy with Dr. Allyson Sabal.  Will plan on medical therapy at this point with plans for PCI of RCA if needed in the future.  He prefers to f/u with  Dr. [name].    2) HTN: Started Coreg 6.25 mg BID given MI. Added Lisinopril 10 mg daily given LV dysfunction.    3) Hyperlipidemia:  Start atorvastatin 80 mg daily.  Check fasting lipids in AM.  Start Eliquis for apical thrombus.  He will take it for 3 months.  Stop aspirin in 4 weeks (could stop earlier if there are any bleeding problems.)  He should continue plavix indefinitely.  Appears euvolemic.  Not receiving diuretics.  WIll give an RX for prn Lasix to use for swelling or shortness of breath.  Discharge today.     He will need f/u in [place].  He wants to f/u with Dr. [name] with the [facility].  Signed, Lance Muss, MD  06/20/2016, 8:35 AM

## 2016-10-17 ENCOUNTER — Other Ambulatory Visit: Payer: Self-pay | Admitting: Cardiology

## 2016-10-20 NOTE — Telephone Encounter (Signed)
Rx request sent to pharmacy.  

## 2016-12-12 ENCOUNTER — Other Ambulatory Visit: Payer: Self-pay | Admitting: Cardiovascular Disease

## 2016-12-15 ENCOUNTER — Other Ambulatory Visit: Payer: Self-pay | Admitting: Cardiovascular Disease

## 2017-01-12 ENCOUNTER — Other Ambulatory Visit: Payer: Self-pay | Admitting: Cardiovascular Disease

## 2017-02-11 ENCOUNTER — Encounter: Payer: Self-pay | Admitting: Emergency Medicine

## 2017-02-11 DIAGNOSIS — K5732 Diverticulitis of large intestine without perforation or abscess without bleeding: Secondary | ICD-10-CM | POA: Insufficient documentation

## 2017-02-11 DIAGNOSIS — I739 Peripheral vascular disease, unspecified: Secondary | ICD-10-CM | POA: Diagnosis not present

## 2017-02-11 DIAGNOSIS — F17200 Nicotine dependence, unspecified, uncomplicated: Secondary | ICD-10-CM | POA: Insufficient documentation

## 2017-02-11 DIAGNOSIS — I10 Essential (primary) hypertension: Secondary | ICD-10-CM | POA: Insufficient documentation

## 2017-02-11 DIAGNOSIS — Z79899 Other long term (current) drug therapy: Secondary | ICD-10-CM | POA: Diagnosis not present

## 2017-02-11 DIAGNOSIS — Z7902 Long term (current) use of antithrombotics/antiplatelets: Secondary | ICD-10-CM | POA: Insufficient documentation

## 2017-02-11 DIAGNOSIS — I213 ST elevation (STEMI) myocardial infarction of unspecified site: Secondary | ICD-10-CM | POA: Insufficient documentation

## 2017-02-11 DIAGNOSIS — Z7901 Long term (current) use of anticoagulants: Secondary | ICD-10-CM | POA: Diagnosis not present

## 2017-02-11 DIAGNOSIS — R109 Unspecified abdominal pain: Secondary | ICD-10-CM | POA: Diagnosis present

## 2017-02-11 DIAGNOSIS — I2510 Atherosclerotic heart disease of native coronary artery without angina pectoris: Secondary | ICD-10-CM | POA: Diagnosis not present

## 2017-02-11 LAB — URINALYSIS, COMPLETE (UACMP) WITH MICROSCOPIC
Bacteria, UA: NONE SEEN
GLUCOSE, UA: 50 mg/dL — AB
Hgb urine dipstick: NEGATIVE
KETONES UR: NEGATIVE mg/dL
Leukocytes, UA: NEGATIVE
NITRITE: NEGATIVE
PH: 5 (ref 5.0–8.0)
Protein, ur: 100 mg/dL — AB
Specific Gravity, Urine: 1.029 (ref 1.005–1.030)

## 2017-02-11 LAB — LIPASE, BLOOD: Lipase: 23 U/L (ref 11–51)

## 2017-02-11 LAB — COMPREHENSIVE METABOLIC PANEL
ALK PHOS: 93 U/L (ref 38–126)
ALT: 11 U/L — ABNORMAL LOW (ref 17–63)
ANION GAP: 11 (ref 5–15)
AST: 30 U/L (ref 15–41)
Albumin: 4 g/dL (ref 3.5–5.0)
BILIRUBIN TOTAL: 1.5 mg/dL — AB (ref 0.3–1.2)
BUN: 9 mg/dL (ref 6–20)
CALCIUM: 9.1 mg/dL (ref 8.9–10.3)
CO2: 28 mmol/L (ref 22–32)
Chloride: 102 mmol/L (ref 101–111)
Creatinine, Ser: 1.22 mg/dL (ref 0.61–1.24)
GFR calc non Af Amer: >60 mL/min (ref >60)
Glucose, Bld: 133 mg/dL — ABNORMAL HIGH (ref 65–99)
Potassium: 3.3 mmol/L — ABNORMAL LOW (ref 3.5–5.1)
SODIUM: 141 mmol/L (ref 135–145)
TOTAL PROTEIN: 7.6 g/dL (ref 6.5–8.1)

## 2017-02-11 LAB — CBC
HCT: 53.7 % — ABNORMAL HIGH (ref 40.0–52.0)
HEMOGLOBIN: 17.9 g/dL (ref 13.0–18.0)
MCH: 35.2 pg — ABNORMAL HIGH (ref 26.0–34.0)
MCHC: 33.3 g/dL (ref 32.0–36.0)
MCV: 105.6 fL — ABNORMAL HIGH (ref 80.0–100.0)
Platelets: 207 10*3/uL (ref 150–440)
RBC: 5.08 MIL/uL (ref 4.40–5.90)
RDW: 15.5 % — ABNORMAL HIGH (ref 11.5–14.5)
WBC: 11.7 10*3/uL — AB (ref 3.8–10.6)

## 2017-02-11 NOTE — ED Triage Notes (Addendum)
Pt ambulatory to triage with steady gait, no distress noted. Pt c/o lower left side abdominal pain x3 weeks, worse after eating. Per pt, pain increases after eating and feels like pressure in the LLQ of abdomin. Pt denies urinary symptoms as well as constipation, N/V/D.

## 2017-02-12 ENCOUNTER — Encounter: Payer: Self-pay | Admitting: Radiology

## 2017-02-12 ENCOUNTER — Emergency Department
Admission: EM | Admit: 2017-02-12 | Discharge: 2017-02-12 | Disposition: A | Discharge status: Home / Self Care | Payer: Commercial Managed Care - PPO | Attending: Student in an Organized Health Care Education/Training Program | Admitting: Student in an Organized Health Care Education/Training Program

## 2017-02-12 ENCOUNTER — Emergency Department: Payer: Commercial Managed Care - PPO

## 2017-02-12 DIAGNOSIS — K5732 Diverticulitis of large intestine without perforation or abscess without bleeding: Secondary | ICD-10-CM

## 2017-02-12 DIAGNOSIS — I739 Peripheral vascular disease, unspecified: Secondary | ICD-10-CM

## 2017-02-12 MED ORDER — IOPAMIDOL (ISOVUE-300) INJECTION 61%
100.0000 mL | Freq: Once | INTRAVENOUS | Status: AC | PRN
  Administered 2017-02-12: 100 mL via INTRAVENOUS

## 2017-02-12 MED ORDER — ONDANSETRON HCL 4 MG PO TABS: 4.0000 mg | ORAL_TABLET | Freq: Every day | ORAL | 0 refills | Status: AC | PRN

## 2017-02-12 MED ORDER — METRONIDAZOLE 250 MG PO TABS: 250.0000 mg | ORAL_TABLET | Freq: Three times a day (TID) | ORAL | 0 refills | Status: AC

## 2017-02-12 MED ORDER — TRAMADOL HCL 50 MG PO TABS: 50.0000 mg | ORAL_TABLET | Freq: Four times a day (QID) | ORAL | 0 refills | Status: DC | PRN

## 2017-02-12 MED ORDER — CIPROFLOXACIN HCL 500 MG PO TABS: 500.0000 mg | ORAL_TABLET | Freq: Two times a day (BID) | ORAL | 0 refills | Status: AC

## 2017-02-12 MED ORDER — CIPROFLOXACIN IN D5W 400 MG/200ML IV SOLN
400.0000 mg | Freq: Once | INTRAVENOUS | Status: AC
  Administered 2017-02-12: 400 mg via INTRAVENOUS
  Filled 2017-02-12: qty 200

## 2017-02-12 MED ORDER — METRONIDAZOLE IN NACL 5-0.79 MG/ML-% IV SOLN
500.0000 mg | Freq: Once | INTRAVENOUS | Status: AC
  Administered 2017-02-12: 500 mg via INTRAVENOUS
  Filled 2017-02-12: qty 100

## 2017-02-12 NOTE — ED Notes (Signed)
Patient transported to CT 

## 2017-02-12 NOTE — Discharge Instructions (Signed)
Please take medications as directed.  Folow up with PCP.  Follow up with Dr. Gilda Crease.  Return immediately if you develop any worsening pain, fevers, inability to keep your medications down.

## 2017-02-12 NOTE — ED Provider Notes (Signed)
[facility] Emergency Department Provider Note    First MD Initiated Contact with Patient 02/12/17 0142     (approximate)  I have reviewed the triage vital signs and the nursing notes.   HISTORY  Chief Complaint Abdominal Pain    HPI Cameron [name] is a 56 y.o. male chief complaint of several days to weeks of worsening left lower quadrant pain associated with nausea and feeling of increased fullness. Patient without any fevers or chills. No dysuria. No pain going into his groin. States the pain is dull and achy in sensation. Does not change with moving his bowels. Eyes any chest pain or shortness of breath that he does have cardiac history.   Past Medical History:  Diagnosis Date  . Coronary artery disease    a. 12/17 PCI with DES to pLAD with resdiual disease (RX therapy)  . Hyperlipidemia   . Hypertension    History reviewed. No pertinent family history. Past Surgical History:  Procedure Laterality Date  . CARDIAC CATHETERIZATION N/A 06/17/2016   Procedure: Left Heart Cath and Coronary Angiography;  Surgeon: Runell Gess, MD;  Location: [facility] INVASIVE CV LAB;  Service: Cardiovascular;  Laterality: N/A;  . CARDIAC CATHETERIZATION N/A 06/17/2016   Procedure: Coronary Stent Intervention;  Surgeon: Runell Gess, MD;  Location: [facility] INVASIVE CV LAB;  Service: Cardiovascular;  Laterality: N/A;  . cardiac stents     Patient Active Problem List   Diagnosis Date Noted  . Essential hypertension 06/20/2016  . Hyperlipidemia 06/20/2016  . CAD (coronary artery disease), native coronary artery 06/20/2016  . STEMI involving oth coronary artery of anterior wall (HCC) 06/17/2016  . STEMI (ST elevation myocardial infarction) (HCC) 06/17/2016      Prior to Admission medications   Medication Sig Start Date End Date Taking? Authorizing Provider  apixaban (ELIQUIS) 5 MG TABS tablet Take 1 tablet (5 mg total) by mouth 2 (two) times daily. Please schedule  appointment for refills. 10/20/16   Runell Gess, MD  aspirin EC 81 MG tablet Take 81 mg by mouth daily.    [provider]  atorvastatin (LIPITOR) 80 MG tablet Take 1 tablet (80 mg total) by mouth daily at 6 PM. 06/20/16   Arty Baumgartner, NP  carvedilol (COREG) 6.25 MG tablet Take 1 tablet (6.25 mg total) by mouth 2 (two) times daily with a meal. 06/20/16   Arty Baumgartner, NP  ciprofloxacin (CIPRO) 500 MG tablet Take 1 tablet (500 mg total) by mouth 2 (two) times daily. 02/12/17 02/22/17  Willy Eddy, MD  clopidogrel (PLAVIX) 75 MG tablet Take 1 tablet (75 mg total) by mouth daily. 06/20/16   Arty Baumgartner, NP  furosemide (LASIX) 20 MG tablet TAKE 1 TAB BY MOUTH DAILY AS NEEDED FOR 2LB WEIGHT GAIN IN 1DAY OR 3-5LBS IN 3 DAYS.**NEED APPT** 01/12/17   Runell Gess, MD  lisinopril (PRINIVIL,ZESTRIL) 10 MG tablet Take 1 tablet (10 mg total) by mouth daily. 06/20/16   Arty Baumgartner, NP  metroNIDAZOLE (FLAGYL) 250 MG tablet Take 1 tablet (250 mg total) by mouth 3 (three) times daily. 02/12/17 02/19/17  Willy Eddy, MD  nitroGLYCERIN (NITROSTAT) 0.4 MG SL tablet Place 1 tablet (0.4 mg total) under the tongue every 5 (five) minutes as needed. 06/20/16   Arty Baumgartner, NP  ondansetron (ZOFRAN) 4 MG tablet Take 1 tablet (4 mg total) by mouth daily as needed for nausea or vomiting. 02/12/17 02/12/18  Willy Eddy, MD  traMADol Janean Sark)  50 MG tablet Take 1 tablet (50 mg total) by mouth every 6 (six) hours as needed. 02/12/17 02/12/18  Willy Eddy, MD    Allergies Patient has no known allergies.    Social History Social History  Substance Use Topics  . Smoking status: Current Every Day Smoker  . Smokeless tobacco: Never Used  . Alcohol use Yes    Review of Systems Patient denies headaches, rhinorrhea, blurry vision, numbness, shortness of breath, chest pain, edema, cough, abdominal pain, nausea, vomiting, diarrhea, dysuria, fevers, rashes or  hallucinations unless otherwise stated above in HPI. ____________________________________________   PHYSICAL EXAM:  VITAL SIGNS: Vitals:   02/12/17 0400 02/12/17 0430  BP: 107/84 116/90  Pulse: 75 68  Resp: 18 17  Temp:      Constitutional: Alert and oriented. Well appearing and in no acute distress. Eyes: Conjunctivae are normal.  Head: Atraumatic. Nose: No congestion/rhinnorhea. Mouth/Throat: Mucous membranes are moist.   Neck: No stridor. Painless ROM.  Cardiovascular: Normal rate, regular rhythm. Grossly normal heart sounds.  Good peripheral circulation. Respiratory: Normal respiratory effort.  No retractions. Lungs CTAB. Gastrointestinal: Soft and tender in llq. No distention. No abdominal bruits. No CVA tenderness. No pulsatile mass Musculoskeletal: No lower extremity tenderness nor edema.  No joint effusions.  BLE are warm to touch, brisk cap refill.  Unable to palpate DP or PT pulses bilaterally Neurologic:  Normal speech and language. No gross focal neurologic deficits are appreciated. No facial droop Skin:  Skin is warm, dry and intact. No rash noted. Psychiatric: Mood and affect are normal. Speech and behavior are normal.  ____________________________________________   LABS (all labs ordered are listed, but only abnormal results are displayed)  Results for orders placed or performed during the hospital encounter of 02/12/17 (from the past 24 hour(s))  Lipase, blood     Status: None   Collection Time: 02/11/17 10:37 PM  Result Value Ref Range   Lipase 23 11 - 51 U/L  Comprehensive metabolic panel     Status: Abnormal   Collection Time: 02/11/17 10:37 PM  Result Value Ref Range   Sodium 141 135 - 145 mmol/L   Potassium 3.3 (L) 3.5 - 5.1 mmol/L   Chloride 102 101 - 111 mmol/L   CO2 28 22 - 32 mmol/L   Glucose, Bld 133 (H) 65 - 99 mg/dL   BUN 9 6 - 20 mg/dL   Creatinine, Ser 1.61 0.61 - 1.24 mg/dL   Calcium 9.1 8.9 - 09.6 mg/dL   Total Protein 7.6 6.5 - 8.1  g/dL   Albumin 4.0 3.5 - 5.0 g/dL   AST 30 15 - 41 U/L   ALT 11 (L) 17 - 63 U/L   Alkaline Phosphatase 93 38 - 126 U/L   Total Bilirubin 1.5 (H) 0.3 - 1.2 mg/dL   GFR calc non Af Amer >60 >60 mL/min   GFR calc Af Amer >60 >60 mL/min   Anion gap 11 5 - 15  CBC     Status: Abnormal   Collection Time: 02/11/17 10:37 PM  Result Value Ref Range   WBC 11.7 (H) 3.8 - 10.6 K/uL   RBC 5.08 4.40 - 5.90 MIL/uL   Hemoglobin 17.9 13.0 - 18.0 g/dL   HCT 04.5 (H) 40.9 - 81.1 %   MCV 105.6 (H) 80.0 - 100.0 fL   MCH 35.2 (H) 26.0 - 34.0 pg   MCHC 33.3 32.0 - 36.0 g/dL   RDW 91.4 (H) 78.2 - 95.6 %   Platelets 207  150 - 440 K/uL  Urinalysis, Complete w Microscopic     Status: Abnormal   Collection Time: 02/11/17 10:37 PM  Result Value Ref Range   Color, Urine AMBER (A) YELLOW   APPearance HAZY (A) CLEAR   Specific Gravity, Urine 1.029 1.005 - 1.030   pH 5.0 5.0 - 8.0   Glucose, UA 50 (A) NEGATIVE mg/dL   Hgb urine dipstick NEGATIVE NEGATIVE   Bilirubin Urine SMALL (A) NEGATIVE   Ketones, ur NEGATIVE NEGATIVE mg/dL   Protein, ur 119 (A) NEGATIVE mg/dL   Nitrite NEGATIVE NEGATIVE   Leukocytes, UA NEGATIVE NEGATIVE   RBC / HPF 6-30 0 - 5 RBC/hpf   WBC, UA 0-5 0 - 5 WBC/hpf   Bacteria, UA NONE SEEN NONE SEEN   Squamous Epithelial / LPF 0-5 (A) NONE SEEN   Mucous PRESENT    Hyaline Casts, UA PRESENT    ____________________________________________ ____________________________________________  RADIOLOGY  I personally reviewed all radiographic images ordered to evaluate for the above acute complaints and reviewed radiology reports and findings.  These findings were personally discussed with the patient.  Please see medical record for radiology report.  ____________________________________________   PROCEDURES  Procedure(s) performed:  Procedures    Critical Care performed: no ____________________________________________   INITIAL IMPRESSION / ASSESSMENT AND PLAN / ED  COURSE  Pertinent labs & imaging results that were available during my care of the patient were reviewed by me and considered in my medical decision making (see chart for details).  DDX: diverticulitis, mass, stone, colitis, msk strain, hernia  LEWIS KEATS is a 56 y.o. who presents to the ED with Acute left lower quadrant pain as described above. Based on his presentation and tenderness to palpation CT imaging ordered to evaluate for above complaints.  The patient will be placed on continuous pulse oximetry and telemetry for monitoring.  Laboratory evaluation will be sent to evaluate for the above complaints.     Clinical Course as of Feb 13 455  Thu Feb 12, 2017  1478 I discussed the findings regarding concern for occlusive SFA and mural abdominal aortic thrombus with Dr. Odella Aquas vascular surgery. Patient has good distal perfusion without any clinical signs to suggest acute limits ischemia suggesting satisfactory collateralization. He is already on aspirin and Plavix and eloquent was.In the setting of his acute diverticulitis is recommended against vascular intervention due to concern for a stent infection I will give dose of IV antibiotics for diverticulitis but the patient is otherwise well-appearing and in no acute distress.  [PR]    Clinical Course User Index [PR] Willy Eddy, MD   Patient in no acute distress. No evidence of acute ischemia and his lower extremities. This provided for the patient is appropriate for trial of outpatient management with antibiotics as well as pain medication and nausea medication.  Patient given strict return precautions in particular discussed signs and symptoms of any acute limits ischemia for which the patient should return immediately to hospital.  ____________________________________________   FINAL CLINICAL IMPRESSION(S) / ED DIAGNOSES  Final diagnoses:  Diverticulitis of large intestine without perforation or abscess without bleeding   Peripheral vascular disease (HCC)      NEW MEDICATIONS STARTED DURING THIS VISIT:  New Prescriptions   CIPROFLOXACIN (CIPRO) 500 MG TABLET    Take 1 tablet (500 mg total) by mouth 2 (two) times daily.   METRONIDAZOLE (FLAGYL) 250 MG TABLET    Take 1 tablet (250 mg total) by mouth 3 (three) times daily.   ONDANSETRON (  ZOFRAN) 4 MG TABLET    Take 1 tablet (4 mg total) by mouth daily as needed for nausea or vomiting.   TRAMADOL (ULTRAM) 50 MG TABLET    Take 1 tablet (50 mg total) by mouth every 6 (six) hours as needed.     Note:  This document was prepared using Dragon voice recognition software and may include unintentional dictation errors.    Willy Eddy, MD 02/12/17 [phone]

## 2017-03-05 ENCOUNTER — Ambulatory Visit
Admission: RE | Admit: 2017-03-05 | Discharge: 2017-03-05 | Disposition: A | Discharge status: Home / Self Care | Payer: Commercial Managed Care - PPO | Source: Ambulatory Visit | Attending: Emergency Medicine | Admitting: Emergency Medicine

## 2017-03-05 ENCOUNTER — Ambulatory Visit
Admission: EM | Admit: 2017-03-05 | Discharge: 2017-03-05 | Disposition: A | Discharge status: Home / Self Care | Payer: Commercial Managed Care - PPO | Attending: Family Medicine | Admitting: Family Medicine

## 2017-03-05 ENCOUNTER — Ambulatory Visit (INDEPENDENT_AMBULATORY_CARE_PROVIDER_SITE_OTHER): Payer: Commercial Managed Care - PPO

## 2017-03-05 DIAGNOSIS — R609 Edema, unspecified: Secondary | ICD-10-CM

## 2017-03-05 DIAGNOSIS — I7410 Embolism and thrombosis of unspecified parts of aorta: Secondary | ICD-10-CM | POA: Diagnosis not present

## 2017-03-05 DIAGNOSIS — M7989 Other specified soft tissue disorders: Secondary | ICD-10-CM | POA: Insufficient documentation

## 2017-03-05 NOTE — ED Provider Notes (Addendum)
[facility]    CSN: [id number] Arrival date & time: 03/05/17  0913     History   Chief Complaint Chief Complaint  Patient presents with  . Joint Swelling    HPI Cameron [name] is a 56 y.o. male.    03/05/2017 16:00 Received a call from ultrasound following the venous Doppler ultrasound. The patient did not have any evidence of a DVT. Therefore I recommended that he be seen by his cardiologist Dr. Rhae Lerner or [facility] since he currently does not have a primary care physician. In meantime, he should elevate his leg above his heart as frequently as possible. Have also advised him he should rest his leg as much as possible. He has an excuse from work Advertising account executive. He states that he's been walking excessively on his leg due to another supervisor being on vacation. He has been standing on concrete 12 hours a day. He will Need  further evaluation as to the cause of his unilateral 2+ pitting edema. The patient understands.      This a 55 year old male who presents with swelling of his right knee and distal leg. He states that 3 days ago he began to have pain in his knee morning awoke and found his whole leg to be swollen. He states that when he keeps his knee in extension it doesn't hurt but when he bends it particularly with moderate amount of flexion he begins to hurt from the tightness. Pain is over the patellar region. He states that he has had a STEMI in the past and has undergone stent placement after catheterization. I reviewed a CAT scan performed on 02/12/2017 which showed occlusion of the left SFA and mild mural thrombus along the distal abdominal aorta without significant luminal narrowing. He states that his knee would become swollen several times per year and he would eat cherries and drink plenty of water and  seemed to dissipate within 2-3 days.       Past Medical History:  Diagnosis Date  . Coronary artery disease    a. 12/17 PCI with DES to  pLAD with resdiual disease (RX therapy)  . Hyperlipidemia   . Hypertension     Patient Active Problem List   Diagnosis Date Noted  . Essential hypertension 06/20/2016  . Hyperlipidemia 06/20/2016  . CAD (coronary artery disease), native coronary artery 06/20/2016  . STEMI involving oth coronary artery of anterior wall (HCC) 06/17/2016  . STEMI (ST elevation myocardial infarction) (HCC) 06/17/2016    Past Surgical History:  Procedure Laterality Date  . CARDIAC CATHETERIZATION N/A 06/17/2016   Procedure: Left Heart Cath and Coronary Angiography;  Surgeon: Runell Gess, MD;  Location: [facility] INVASIVE CV LAB;  Service: Cardiovascular;  Laterality: N/A;  . CARDIAC CATHETERIZATION N/A 06/17/2016   Procedure: Coronary Stent Intervention;  Surgeon: Runell Gess, MD;  Location: [facility] INVASIVE CV LAB;  Service: Cardiovascular;  Laterality: N/A;  . cardiac stents         Home Medications    Prior to Admission medications   Medication Sig Start Date End Date Taking? Authorizing Provider  apixaban (ELIQUIS) 5 MG TABS tablet Take 1 tablet (5 mg total) by mouth 2 (two) times daily. Please schedule appointment for refills. 10/20/16   Runell Gess, MD  aspirin EC 81 MG tablet Take 81 mg by mouth daily.    [provider]  atorvastatin (LIPITOR) 80 MG tablet Take 1 tablet (80 mg total) by mouth daily at 6 PM. 06/20/16  Arty Baumgartner, NP  carvedilol (COREG) 6.25 MG tablet Take 1 tablet (6.25 mg total) by mouth 2 (two) times daily with a meal. 06/20/16   Arty Baumgartner, NP  clopidogrel (PLAVIX) 75 MG tablet Take 1 tablet (75 mg total) by mouth daily. 06/20/16   Arty Baumgartner, NP  furosemide (LASIX) 20 MG tablet TAKE 1 TAB BY MOUTH DAILY AS NEEDED FOR 2LB WEIGHT GAIN IN 1DAY OR 3-5LBS IN 3 DAYS.**NEED APPT** 01/12/17   Runell Gess, MD  lisinopril (PRINIVIL,ZESTRIL) 10 MG tablet Take 1 tablet (10 mg total) by mouth daily. 06/20/16   Arty Baumgartner, NP    nitroGLYCERIN (NITROSTAT) 0.4 MG SL tablet Place 1 tablet (0.4 mg total) under the tongue every 5 (five) minutes as needed. 06/20/16   Arty Baumgartner, NP  ondansetron (ZOFRAN) 4 MG tablet Take 1 tablet (4 mg total) by mouth daily as needed for nausea or vomiting. 02/12/17 02/12/18  Willy Eddy, MD  traMADol (ULTRAM) 50 MG tablet Take 1 tablet (50 mg total) by mouth every 6 (six) hours as needed. 02/12/17 02/12/18  Willy Eddy, MD    Family History History reviewed. No pertinent family history.  Social History Social History  Substance Use Topics  . Smoking status: Current Every Day Smoker    Packs/day: 1.00    Types: Cigarettes  . Smokeless tobacco: Never Used  . Alcohol use Yes     Comment: weekends     Allergies   Patient has no known allergies.   Review of Systems Review of Systems  Constitutional: Positive for activity change. Negative for appetite change, chills, fatigue and fever.  Musculoskeletal: Positive for arthralgias, gait problem and joint swelling.  All other systems reviewed and are negative.    Physical Exam Triage Vital Signs ED Triage Vitals  Enc Vitals Group     BP 03/05/17 0927 (!) 147/90     Pulse Rate 03/05/17 0927 73     Resp 03/05/17 0927 18     Temp 03/05/17 0927 98.2 F (36.8 C)     Temp Source 03/05/17 0927 Oral     SpO2 03/05/17 0927 98 %     Weight 03/05/17 0926 200 lb (90.7 kg)     Height 03/05/17 0926 5' 6.5" (1.689 m)     Head Circumference --      Peak Flow --      Pain Score 03/05/17 0929 5     Pain Loc --      Pain Edu? --      Excl. in GC? --    No data found.   Updated Vital Signs BP (!) 147/90 (BP Location: Left Arm)   Pulse 74   Temp 98.2 F (36.8 C) (Oral)   Resp 18   Ht 5' 6.5" (1.689 m)   Wt 200 lb (90.7 kg)   SpO2 97%   BMI 31.80 kg/m   Visual Acuity Right Eye Distance:   Left Eye Distance:   Bilateral Distance:    Right Eye Near:   Left Eye Near:    Bilateral Near:     Physical Exam   Constitutional: He appears well-developed and well-nourished. No distress.  HENT:  Head: Normocephalic.  Eyes: Pupils are equal, round, and reactive to light.  Neck: Normal range of motion.  Pulmonary/Chest: Effort normal and breath sounds normal.  Musculoskeletal: He exhibits edema and tenderness.  Examination of the right knee shows 2+ pitting edema about the ankle and foot and midway up  the lower extremity. Maximal circumference in the right lower extremity was 42 cm compared with 37 on the left. 4 finger breaths above the superior border of the patella measures 48 on the right and 45 cm on the left respectively. Patient has a comfortable range of motion from approximately full extension to 45 degrees. Maximum tenderness in the knee is around the patella and retropatellar. Most of the swelling in the knee appears to be in the suprapatellar area.  Skin: He is not diaphoretic.  Psychiatric: He has a normal mood and affect. His behavior is normal. Judgment and thought content normal.  Nursing note and vitals reviewed.    UC Treatments / Results  Labs (all labs ordered are listed, but only abnormal results are displayed) Labs Reviewed - No data to display  EKG  EKG Interpretation None       Radiology US Venous Img Lower Unilateral Right  Result Date: 03/05/2017 CLINICAL DATA:  Acute right lower extremity swelling. EXAM: Right LOWER EXTREMITY VENOUS DOPPLER ULTRASOUND TECHNIQUE: Gray-scale sonography with graded compression, as well as color Doppler and duplex ultrasound were performed to evaluate the lower extremity deep venous systems from the level of the common femoral vein and including the common femoral, femoral, profunda femoral, popliteal and calf veins including the posterior tibial, peroneal and gastrocnemius veins when visible. The superficial great saphenous vein was also interrogated. Spectral Doppler was utilized to evaluate flow at rest and with distal augmentation  maneuvers in the common femoral, femoral and popliteal veins. COMPARISON:  None. FINDINGS: Contralateral Common Femoral Vein: Respiratory phasicity is normal and symmetric with the symptomatic side. No evidence of thrombus. Normal compressibility. Common Femoral Vein: No evidence of thrombus. Normal compressibility, respiratory phasicity and response to augmentation. Saphenofemoral Junction: No evidence of thrombus. Normal compressibility and flow on color Doppler imaging. Profunda Femoral Vein: No evidence of thrombus. Normal compressibility and flow on color Doppler imaging. Femoral Vein: No evidence of thrombus. Normal compressibility, respiratory phasicity and response to augmentation. Popliteal Vein: No evidence of thrombus. Normal compressibility, respiratory phasicity and response to augmentation. Calf Veins: No evidence of thrombus. Normal compressibility and flow on color Doppler imaging. Venous Reflux:  None. Other Findings: Several small benign appearing lymph nodes are noted in the right inguinal region, with the largest measuring 7 mm in minor diameter. IMPRESSION: No evidence of DVT within the right lower extremity. Electronically Signed   By: Lupita Raider, M.D.   On: 03/05/2017 15:26   Dg Knee Complete 4 Views Right  Result Date: 03/05/2017 CLINICAL DATA:  Patient with knee pain and swelling. No known injury. Initial encounter. EXAM: RIGHT KNEE - COMPLETE 4+ VIEW COMPARISON:  None. FINDINGS: Normal anatomic alignment. No evidence for acute fracture or dislocation. 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 No large joint effusion. IMPRESSION: Bipartite patella.  No acute osseus abnormality. Electronically Signed   By: Annia Belt M.D.   On: 03/05/2017 10:39    Procedures Procedures (including critical care time)  Medications Ordered in UC Medications - No data to display   Initial Impression / Assessment and Plan / UC Course  I have reviewed the triage vital signs and the nursing  notes.  Pertinent labs & imaging results that were available during my care of the patient were reviewed by me and considered in my medical decision making (see chart for details).     Plan: 1. Test/x-ray results and diagnosis reviewed with patient 2. rx as per orders; risks, benefits, potential side effects reviewed  with patient 3. Recommend supportive treatment with elevation of legs to help control swelling. Patient will need close follow-up with the primary care. He doesn't have a specific primary care physician since his had retired. He will need to arrange another primary care  to follow. Therefore should see he should schedule an appointment with his cardiologist as soon as possible. I'm sending them to [facility] for a venous Doppler ultrasound of the right lower extremity. Is highly unlikely that he has a DVT due to his current medication of Plavix aspirin and Eliquis. Inflow appears to be adequate he does have Doppler arterial pulses in the femoral, popliteal, dorsalis pedis and posterior tibialis distribution. I will discuss further recommendations after the venous Doppler ultrasound has been completed at 3:15 today [facility] 4. F/u prn if symptoms worsen or don't improve   Final Clinical Impressions(s) / UC Diagnoses   Final diagnoses:  Peripheral edema  Aortic mural thrombus [facility])    New Prescriptions Discharge Medication List as of 03/05/2017 11:31 AM       Controlled Substance Prescriptions [place] Controlled Substance Registry consulted? Not Applicable   Lutricia Feil, PA-C 03/05/17 1132         Lutricia Feil, PA-C 03/05/17 [phone]

## 2017-03-05 NOTE — ED Triage Notes (Signed)
Pt reports he thinks he has gout. Right knee swelling and warmth. Pain 5/10.

## 2017-03-05 NOTE — ED Notes (Signed)
Bilateral dorsalis pedis and posterior tibial pulses heard by doppler.

## 2017-03-09 ENCOUNTER — Encounter: Payer: Self-pay | Admitting: Emergency Medicine

## 2017-03-09 ENCOUNTER — Emergency Department
Admission: EM | Admit: 2017-03-09 | Discharge: 2017-03-09 | Disposition: A | Discharge status: Home / Self Care | Payer: Commercial Managed Care - PPO | Attending: Emergency Medicine | Admitting: Emergency Medicine

## 2017-03-09 ENCOUNTER — Emergency Department: Payer: Commercial Managed Care - PPO

## 2017-03-09 DIAGNOSIS — F17210 Nicotine dependence, cigarettes, uncomplicated: Secondary | ICD-10-CM | POA: Insufficient documentation

## 2017-03-09 DIAGNOSIS — I10 Essential (primary) hypertension: Secondary | ICD-10-CM | POA: Insufficient documentation

## 2017-03-09 DIAGNOSIS — Z7901 Long term (current) use of anticoagulants: Secondary | ICD-10-CM | POA: Insufficient documentation

## 2017-03-09 DIAGNOSIS — Z79899 Other long term (current) drug therapy: Secondary | ICD-10-CM | POA: Diagnosis not present

## 2017-03-09 DIAGNOSIS — I252 Old myocardial infarction: Secondary | ICD-10-CM | POA: Diagnosis not present

## 2017-03-09 DIAGNOSIS — Z9861 Coronary angioplasty status: Secondary | ICD-10-CM | POA: Insufficient documentation

## 2017-03-09 DIAGNOSIS — N200 Calculus of kidney: Secondary | ICD-10-CM | POA: Diagnosis not present

## 2017-03-09 DIAGNOSIS — Z7982 Long term (current) use of aspirin: Secondary | ICD-10-CM | POA: Diagnosis not present

## 2017-03-09 DIAGNOSIS — Z7902 Long term (current) use of antithrombotics/antiplatelets: Secondary | ICD-10-CM | POA: Diagnosis not present

## 2017-03-09 DIAGNOSIS — R319 Hematuria, unspecified: Secondary | ICD-10-CM | POA: Diagnosis present

## 2017-03-09 DIAGNOSIS — I2510 Atherosclerotic heart disease of native coronary artery without angina pectoris: Secondary | ICD-10-CM | POA: Diagnosis not present

## 2017-03-09 LAB — COMPREHENSIVE METABOLIC PANEL
ALBUMIN: 3.8 g/dL (ref 3.5–5.0)
ALT: 9 U/L — AB (ref 17–63)
AST: 19 U/L (ref 15–41)
Alkaline Phosphatase: 57 U/L (ref 38–126)
Anion gap: 10 (ref 5–15)
BUN: 8 mg/dL (ref 6–20)
CHLORIDE: 105 mmol/L (ref 101–111)
CO2: 26 mmol/L (ref 22–32)
CREATININE: 0.96 mg/dL (ref 0.61–1.24)
Calcium: 9 mg/dL (ref 8.9–10.3)
GFR calc Af Amer: >60 mL/min (ref >60)
Glucose, Bld: 135 mg/dL — ABNORMAL HIGH (ref 65–99)
Potassium: 3 mmol/L — ABNORMAL LOW (ref 3.5–5.1)
Sodium: 141 mmol/L (ref 135–145)
Total Bilirubin: 0.7 mg/dL (ref 0.3–1.2)
Total Protein: 7.3 g/dL (ref 6.5–8.1)

## 2017-03-09 LAB — URINALYSIS, COMPLETE (UACMP) WITH MICROSCOPIC
BACTERIA UA: NONE SEEN
SPECIFIC GRAVITY, URINE: 1.027 (ref 1.005–1.030)
Squamous Epithelial / LPF: NONE SEEN

## 2017-03-09 LAB — CBC
HEMATOCRIT: 41.7 % (ref 40.0–52.0)
Hemoglobin: 13.9 g/dL (ref 13.0–18.0)
MCH: 35.1 pg — AB (ref 26.0–34.0)
MCHC: 33.3 g/dL (ref 32.0–36.0)
MCV: 105.4 fL — AB (ref 80.0–100.0)
PLATELETS: 262 10*3/uL (ref 150–440)
RBC: 3.95 MIL/uL — ABNORMAL LOW (ref 4.40–5.90)
RDW: 15.4 % — AB (ref 11.5–14.5)
WBC: 11.1 10*3/uL — AB (ref 3.8–10.6)

## 2017-03-09 LAB — TROPONIN I: TROPONIN I: 0.03 ng/mL — AB (ref <0.03)

## 2017-03-09 LAB — LIPASE, BLOOD: LIPASE: 20 U/L (ref 11–51)

## 2017-03-09 MED ORDER — ACETAMINOPHEN 500 MG PO TABS
1000.0000 mg | ORAL_TABLET | ORAL | Status: AC
  Administered 2017-03-09: 1000 mg via ORAL
  Filled 2017-03-09: qty 2

## 2017-03-09 MED ORDER — POTASSIUM CHLORIDE CRYS ER 20 MEQ PO TBCR
40.0000 meq | EXTENDED_RELEASE_TABLET | ORAL | Status: AC
  Administered 2017-03-09: 40 meq via ORAL
  Filled 2017-03-09: qty 2

## 2017-03-09 MED ORDER — IOPAMIDOL (ISOVUE-370) INJECTION 76%
100.0000 mL | Freq: Once | INTRAVENOUS | Status: AC | PRN
  Administered 2017-03-09: 100 mL via INTRAVENOUS

## 2017-03-09 MED ORDER — ONDANSETRON 4 MG PO TBDP: 4.0000 mg | ORAL_TABLET | Freq: Four times a day (QID) | ORAL | 0 refills | Status: DC | PRN

## 2017-03-09 MED ORDER — OXYCODONE HCL 5 MG PO TABS: 5.0000 mg | ORAL_TABLET | Freq: Four times a day (QID) | ORAL | 0 refills | Status: DC | PRN

## 2017-03-09 MED ORDER — ONDANSETRON 4 MG PO TBDP
4.0000 mg | ORAL_TABLET | Freq: Once | ORAL | Status: AC
  Administered 2017-03-09: 4 mg via ORAL
  Filled 2017-03-09: qty 1

## 2017-03-09 MED ORDER — TAMSULOSIN HCL 0.4 MG PO CAPS: 0.4000 mg | ORAL_CAPSULE | Freq: Every day | ORAL | 0 refills | Status: DC

## 2017-03-09 NOTE — ED Notes (Signed)
Pt taken to CT.

## 2017-03-09 NOTE — Discharge Instructions (Signed)
You have been seen in the Emergency Department (ED) today for pain that we believe based on your workup, is caused by kidney stones.  As we have discussed, please drink plenty of fluids.  Please make a follow up appointment with the physician(s) listed elsewhere in this documentation. ° °You may take pain medication as needed but ONLY as prescribed.  Please also take your prescribed Flomax daily.  We also recommend that you take over-the-counter ibuprofen regularly according to label instructions over the next 5 days.  Take it with meals to minimize stomach discomfort. ° °Please see your doctor as soon as possible as stones may take 1-3 weeks to pass and you may require additional care or medications. ° °Do not drink alcohol, drive or participate in any other potentially dangerous activities while taking opiate pain medication as it may make you sleepy. Do not take this medication with any other sedating medications, either prescription or over-the-counter. If you were prescribed Percocet or Vicodin, do not take these with acetaminophen (Tylenol) as it is already contained within these medications. °  °This medication is an opiate (or narcotic) pain medication and can be habit forming.  Use it as little as possible to achieve adequate pain control.  Do not use or use it with extreme caution if you have a history of opiate abuse or dependence.  If you are on a pain contract with your primary care doctor or a pain specialist, be sure to let them know you were prescribed this medication today from the [facility].  This medication is intended for your use only - do not give any to anyone else and keep it in a secure place where nobody else, especially children, have access to it.  It will also cause or worsen constipation, so you may want to consider taking an over-the-counter stool softener while you are taking this medication. ° °Return to the Emergency Department (ED) or call your doctor  if you have any worsening pain, fever, painful urination, are unable to urinate, or develop other symptoms that concern you. ° °

## 2017-03-09 NOTE — ED Triage Notes (Addendum)
Pt to ed with c/o lower abd pain and hematuria today.  Pt pale, diaphoretic.

## 2017-03-09 NOTE — ED Provider Notes (Signed)
[facility] Emergency Department Provider Note   ____________________________________________   First MD Initiated Contact with Patient 03/09/17 1118     (approximate)  I have reviewed the triage vital signs and the nursing notes.   HISTORY  Chief Complaint Hematuria and Abdominal Pain    HPI Cameron [name] is a 56 y.o. male history of coronary disease and recent diverticulitis  Patient reports since having divert off and on abdominal pain. This morning when he went to urinareports he went to urinate again and it's b "bloody". Minimal increase in pain primarily  He is able to urinate, but reports No fevers or vomiting. Some mild  He reports he drove here, and does not wish for any strong pain  At this time but rather wants to  Figure out why he has blood in the  No chest pain or trouble breathing. He's had leg swellingover the last week, but reports that he had ultrasounds to evaluate this at urgent care a couple days ago and was told no issues except he may be overworking and overexerting  He was also told that he should hav vascular surgery, and plans to pursue this  Currently rates mild to moderate pain urgency to urinate. No penile swelling. No testicular pain. No groin swelling. Remains on plavix and eliquis. Continues to smoke, been told he sh  Past Medical History:  Diagnosis Date  . Coronary artery disease    a. 12/17 PCI with DES to pLAD with resdiual disease (RX therapy)  . Hyperlipidemia   . Hypertension     Patient Active Problem List   Diagnosis Date Noted  . Essential hypertension 06/20/2016  . Hyperlipidemia 06/20/2016  . CAD (coronary artery disease), native coronary artery 06/20/2016  . STEMI involving oth coronary artery of anterior wall (HCC) 06/17/2016  . STEMI (ST elevation myocardial infarction) (HCC) 06/17/2016    Past Surgical History:  Procedure Laterality Date  . CARDIAC CATHETERIZATION N/A 06/17/2016   Procedure: Left Heart Cath and Coronary Angiography;  Surgeon: Runell Gess, MD;  Location: [facility] INVASIVE CV LAB;  Service: Cardiovascular;  Laterality: N/A;  . CARDIAC CATHETERIZATION N/A 06/17/2016   Procedure: Coronary Stent Intervention;  Surgeon: Runell Gess, MD;  Location: [facility] INVASIVE CV LAB;  Service: Cardiovascular;  Laterality: N/A;  . cardiac stents      Prior to Admission medications   Medication Sig Start Date End Date Taking? Authorizing Provider  apixaban (ELIQUIS) 5 MG TABS tablet Take 1 tablet (5 mg total) by mouth 2 (two) times daily. Please schedule appointment for refills. 10/20/16  Yes Runell Gess, MD  aspirin EC 81 MG tablet Take 81 mg by mouth daily.   Yes [provider]  atorvastatin (LIPITOR) 80 MG tablet Take 1 tablet (80 mg total) by mouth daily at 6 PM. Patient taking differently: Take 80 mg by mouth daily.  06/20/16  Yes Arty Baumgartner, NP  carvedilol (COREG) 6.25 MG tablet Take 1 tablet (6.25 mg total) by mouth 2 (two) times daily with a meal. 06/20/16  Yes Laverda Page B, NP  clopidogrel (PLAVIX) 75 MG tablet Take 1 tablet (75 mg total) by mouth daily. 06/20/16  Yes Laverda Page B, NP  furosemide (LASIX) 20 MG tablet TAKE 1 TAB BY MOUTH DAILY AS NEEDED FOR 2LB WEIGHT GAIN IN 1DAY OR 3-5LBS IN 3 DAYS.**NEED APPT** 01/12/17  Yes Runell Gess, MD  lisinopril (PRINIVIL,ZESTRIL) 10 MG tablet Take 1 tablet (10 mg total) by mouth daily.  06/20/16  Yes Arty Baumgartner, NP  nitroGLYCERIN (NITROSTAT) 0.4 MG SL tablet Place 1 tablet (0.4 mg total) under the tongue every 5 (five) minutes as needed. 06/20/16  Yes Arty Baumgartner, NP  ondansetron (ZOFRAN ODT) 4 MG disintegrating tablet Take 1 tablet (4 mg total) by mouth every 6 (six) hours as needed for nausea or vomiting. 03/09/17   Sharyn Creamer, MD  ondansetron (ZOFRAN) 4 MG tablet Take 1 tablet (4 mg total) by mouth daily as needed for nausea or vomiting. Patient not taking: Reported on  03/09/2017 02/12/17 02/12/18  Willy Eddy, MD  oxyCODONE (OXY IR/ROXICODONE) 5 MG immediate release tablet Take 1 tablet (5 mg total) by mouth every 6 (six) hours as needed for severe pain. 03/09/17   Sharyn Creamer, MD  tamsulosin (FLOMAX) 0.4 MG CAPS capsule Take 1 capsule (0.4 mg total) by mouth daily. 03/09/17   Sharyn Creamer, MD  traMADol (ULTRAM) 50 MG tablet Take 1 tablet (50 mg total) by mouth every 6 (six) hours as needed. Patient not taking: Reported on 03/09/2017 02/12/17 02/12/18  Willy Eddy, MD    Allergies Patient has no known allergies.  History reviewed. No pertinent family history.  Social History Social History  Substance Use Topics  . Smoking status: Current Every Day Smoker    Packs/day: 1.00    Types: Cigarettes  . Smokeless tobacco: Never Used  . Alcohol use Yes     Comment: weekends    Review of Systems Constitutional: No fever/chills Eyes: No visual changes. ENT: No sore throat. Cardiovascular: Denies chest pain. Respiratory: Denies shortness of breath. Gastrointestinal:  No diarrhea.  No constipation. Genitourinary: the history of present illnessMusculoskeletal: Negative for back pain. Skin: Negative for rash. Neurological: Negative for headaches, focal weakness or numbness.    ____________________________________________   PHYSICAL EXAM:  VITAL SIGNS: ED Triage Vitals  Enc Vitals Group     BP 03/09/17 1058 (!) 165/99     Pulse Rate 03/09/17 1058 63     Resp 03/09/17 1058 16     Temp 03/09/17 1058 98 F (36.7 C)     Temp Source 03/09/17 1058 Oral     SpO2 03/09/17 1058 94 %     Weight 03/09/17 1103 200 lb (90.7 kg)     Height --      Head Circumference --      Peak Flow --      Pain Score 03/09/17 1102 10     Pain Loc --      Pain Edu? --      Excl. in GC? --     Constitutional: Alert and oriented. Well appearing and in no acute distress. Eyes: Conjunctivae are normal. Head: Atraumatic. Nose: No congestion/rhinnorhea. Mouth/Throat:  Mucous membranes are moist. Neck: No stridor.   Cardiovascular: Normal rate, regular rhythm. Grossly normal heart sounds.  Good peripheral circulation. Respiratory: Normal respiratory effort.  No retractions. Lungs CTAB. Gastrointestinal: Soft and nontenderexcept for mild to moderate tendern. No distention.no penile lesions. No paraphimosis. Testicles nontender no swelling of the scrotum Musculoskeletal: No lower extremity tendernessoes have mild edema in the right lower leg which she reports is been present  Intact and dopplerable distal dorsalis pedis and posterio Neurologic:  Normal speech and language. No gross focal neurologic deficits are appreciated.  Skin:  Skin is warm, dry and intact. No rash noted. Psychiatric: Mood and affect are normal. Speech and behavior are normal.  ____________________________________________   LABS (all labs ordered are listed, but only abnormal results  are displayed)  Labs Reviewed  COMPREHENSIVE METABOLIC PANEL - Abnormal; Notable for the following:       Result Value   Potassium 3.0 (*)    Glucose, Bld 135 (*)    ALT 9 (*)    All other components within normal limits  CBC - Abnormal; Notable for the following:    WBC 11.1 (*)    RBC 3.95 (*)    MCV 105.4 (*)    MCH 35.1 (*)    RDW 15.4 (*)    All other components within normal limits  URINALYSIS, COMPLETE (UACMP) WITH MICROSCOPIC - Abnormal; Notable for the following:    Color, Urine RED (*)    APPearance TURBID (*)    Glucose, UA   (*)    Value: TEST NOT REPORTED DUE TO COLOR INTERFERENCE OF URINE PIGMENT   Hgb urine dipstick   (*)    Value: TEST NOT REPORTED DUE TO COLOR INTERFERENCE OF URINE PIGMENT   Bilirubin Urine   (*)    Value: TEST NOT REPORTED DUE TO COLOR INTERFERENCE OF URINE PIGMENT   Ketones, ur   (*)    Value: TEST NOT REPORTED DUE TO COLOR INTERFERENCE OF URINE PIGMENT   Protein, ur   (*)    Value: TEST NOT REPORTED DUE TO COLOR INTERFERENCE OF URINE PIGMENT   Nitrite    (*)    Value: TEST NOT REPORTED DUE TO COLOR INTERFERENCE OF URINE PIGMENT   Leukocytes, UA   (*)    Value: TEST NOT REPORTED DUE TO COLOR INTERFERENCE OF URINE PIGMENT   All other components within normal limits  TROPONIN I - Abnormal; Notable for the following:    Troponin I 0.03 (*)    All other components within normal limits  URINE CULTURE  LIPASE, BLOOD   ____________________________________________  EKG   ____________________________________________  RADIOLOGY  Ct Angio Abd/pel W And/or Wo Contrast  Result Date: 03/09/2017 CLINICAL DATA:  Acute lower abdominal pain and hematuria EXAM: CTA ABDOMEN AND PELVIS WITH CONTRAST TECHNIQUE: Multidetector CT imaging of the abdomen and pelvis was performed using the standard protocol during bolus administration of intravenous contrast. Multiplanar reconstructed images and MIPs were obtained and reviewed to evaluate the vascular anatomy. CONTRAST:  100 cc Isovue 370 COMPARISON:  02/12/2017 FINDINGS: VASCULAR Aorta: Atherosclerosis of the abdominal aorta without significant aneurysm, dissection, occlusive process, retroperitoneal hemorrhage. Celiac: Patent without evidence of aneurysm, dissection, vasculitis or significant stenosis. SMA: Patent without evidence of aneurysm, dissection, vasculitis or significant stenosis. Renals: Widely patent left renal artery. Irregularity and atherosclerosis of the right renal origin. Difficult to exclude proximal right renal artery significant stenosis. IMA: Atherosclerotic origin but remains patent. Inflow: Calcific atherosclerosis of the iliac vessels. Common, internal and external iliac arteries remain patent. Mild left external iliac atherosclerotic changes and luminal narrowing without occlusion. Proximal Outflow: Atherosclerosis of the patent common femoral and profunda femoral arteries. Proximal right SFA is patent. Proximal left SFA is chronically occluded. Veins: Hepatic, portal, splenic, mesenteric, and  renal veins remain patent. IVC and iliac veins are patent. No veno-occlusive process. Review of the MIP images confirms the above findings. NON-VASCULAR Lower chest: No acute finding. Clear lung bases. Normal heart size. No pericardial or pleural effusion. Mild right infrahilar adenopathy suspected, image 1 series 6. Hepatobiliary: No focal hepatic abnormality or biliary dilatation. Calcified gallstone noted measuring 2 cm. Biliary system unremarkable. No biliary obstruction. Pancreas: Unremarkable. No pancreatic ductal dilatation or surrounding inflammatory changes. Spleen: Normal in size without focal abnormality.  Adrenals/Urinary Tract: Normal adrenal glands for age. Left kidney and ureter demonstrate no acute process, obstruction, hydronephrosis, hydroureter, or obstructing ureteral calculus. Right kidney and ureter demonstrate new acute hydroureteronephrosis. There is perinephric and periureteral strandy edema. This extends to the right UVJ, where there is a tiny punctate obstructing calculus (faintly visualized) measuring 2-3 mm, best demonstrated on the coronal reconstructions, image 109 series 13. Soft tissue prominence at the right UVJ may be related to inflammation and edema from the stone. Urinary bladder is underdistended. Stomach/Bowel: Small hiatal hernia noted. Negative for bowel obstruction, significant dilatation, ileus, or free air. Normal appendix demonstrated. Scattered colonic diverticulosis with chronic wall thickening of the sigmoid colon as before. Lymphatic: Small nonenlarged retroperitoneal periaortic and pericaval nodes. No definite adenopathy. Reproductive: No significant or acute finding by CT. Prostate gland normal in size. Other: Small fat containing right inguinal hernia. Intact abdominal wall. No ventral hernia. Musculoskeletal: Degenerate changes noted. No acute osseous finding. IMPRESSION: VASCULAR Aortoiliac atherosclerosis without occlusion, significant aneurysm or dissection.  Atherosclerotic changes of the right renal origin with luminal narrowing, difficult to exclude significant right renal artery stenosis. Left renal artery is widely patent. 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 NON-VASCULAR 2-3 mm acutely obstructing right UVJ calculus (faintly visualized). There is associated acute right hydroureteronephrosis. Cholelithiasis Diverticulosis with chronic sigmoid wall thickening. Fat containing right inguinal hernia Electronically Signed   By: Judie Petit.  Shick M.D.   On: 03/09/2017 13:05    ____________________________________________   PROCEDURES  Procedure(s) performed: None  Procedures  Critical Care performed: No  ____________________________________________   INITIAL IMPRESSION / ASSESSMENT AND PLAN / ED COURSE  Pertinent labs & imaging results that were available during my care of the patient were reviewed by me and considered in my medical decision making (see chart for details).  Differential diagnosis includes but is not limited to, abdominal perforation, aortic dissection, cholecystitis, appendicitis, diverticulitis, colitis, esophagitis/gastritis, kidney stone, pyelonephritis, urinary tract infection, aortic aneurysm. All are considered in decision and treatment plan. Based upon the patient's presentation and risk factors, ymptoms sound most likely a urinary source given urgency and  Hematuria.  Does have a history of significant a and I will further evaluate with C INCLUDING ANGIOGRAPHY      I will prescribe the patient a narcotic pain medicine due to their condition which I anticipate will cause at least moderate pain short term. I discussed with the patient safe use of narcotic pain medicines, and that they are not to drive, work in dangerous areas, or ever take more than prescribed (no more than 1 pill every 6 hours). We discussed that this is the type of medication that can be  overdosed on and the risks of this type of medicine.  Patient is very agreeable to only use as prescribed and to never use more than prescribed.  Patient reports he urinated, he urinated a fair amount and now h He is alert, comfortable with plan for discharge does not ghave any prescription pain medicine at home.  Return precautions and treatment recommendations and follow-up discussed with the patient who is agreeable with the plan.  ____________________________________________   FINAL CLINICAL IMPRESSION(S) / ED DIAGNOSES  Final diagnoses:  Kidney stone on right side      NEW MEDICATIONS STARTED DURING THIS VISIT:  New Prescriptions   ONDANSETRON (ZOFRAN ODT) 4 MG DISINTEGRATING TABLET    Take 1 tablet (4 mg total) by mouth every 6 (six) hours as needed for nausea or vomiting.   OXYCODONE (  OXY IR/ROXICODONE) 5 MG IMMEDIATE RELEASE TABLET    Take 1 tablet (5 mg total) by mouth every 6 (six) hours as needed for severe pain.   TAMSULOSIN (FLOMAX) 0.4 MG CAPS CAPSULE    Take 1 capsule (0.4 mg total) by mouth daily.     Note:  This document was prepared using Dragon voice recognition software and may include unintentional dictation errors.     Sharyn Creamer, MD 03/09/17 1353

## 2017-03-09 NOTE — ED Notes (Signed)
Assisted pt to toilet. Pt ambulated well, stated "that was so much relief!" and reported he did not see any blood this time.

## 2017-03-11 LAB — URINE CULTURE
Culture: NO GROWTH
SPECIAL REQUESTS: NORMAL

## 2017-03-13 DIAGNOSIS — M25561 Pain in right knee: Secondary | ICD-10-CM | POA: Insufficient documentation

## 2017-04-25 ENCOUNTER — Other Ambulatory Visit: Payer: Self-pay | Admitting: Cardiovascular Disease

## 2017-05-22 ENCOUNTER — Other Ambulatory Visit: Payer: Self-pay | Admitting: Cardiology

## 2017-06-05 ENCOUNTER — Other Ambulatory Visit: Payer: Self-pay | Admitting: Cardiovascular Disease

## 2017-06-22 ENCOUNTER — Other Ambulatory Visit: Payer: Self-pay | Admitting: Cardiology

## 2017-07-05 ENCOUNTER — Other Ambulatory Visit: Payer: Self-pay | Admitting: Cardiovascular Disease

## 2017-11-09 ENCOUNTER — Other Ambulatory Visit: Payer: Self-pay | Admitting: Cardiovascular Disease

## 2017-11-09 NOTE — Telephone Encounter (Signed)
REFILL 

## 2017-12-13 ENCOUNTER — Other Ambulatory Visit: Payer: Self-pay | Admitting: Cardiovascular Disease

## 2018-02-01 ENCOUNTER — Observation Stay
Admission: EM | Admit: 2018-02-01 | Discharge: 2018-02-03 | Disposition: A | Discharge status: Home / Self Care | Payer: Commercial Managed Care - PPO | Attending: Internal Medicine | Admitting: Internal Medicine

## 2018-02-01 ENCOUNTER — Other Ambulatory Visit: Payer: Self-pay

## 2018-02-01 ENCOUNTER — Emergency Department: Payer: Commercial Managed Care - PPO

## 2018-02-01 DIAGNOSIS — Z955 Presence of coronary angioplasty implant and graft: Secondary | ICD-10-CM | POA: Diagnosis not present

## 2018-02-01 DIAGNOSIS — I10 Essential (primary) hypertension: Secondary | ICD-10-CM | POA: Insufficient documentation

## 2018-02-01 DIAGNOSIS — E876 Hypokalemia: Secondary | ICD-10-CM | POA: Insufficient documentation

## 2018-02-01 DIAGNOSIS — Z79899 Other long term (current) drug therapy: Secondary | ICD-10-CM | POA: Insufficient documentation

## 2018-02-01 DIAGNOSIS — I2510 Atherosclerotic heart disease of native coronary artery without angina pectoris: Secondary | ICD-10-CM | POA: Insufficient documentation

## 2018-02-01 DIAGNOSIS — Z7902 Long term (current) use of antithrombotics/antiplatelets: Secondary | ICD-10-CM | POA: Diagnosis not present

## 2018-02-01 DIAGNOSIS — E785 Hyperlipidemia, unspecified: Secondary | ICD-10-CM | POA: Insufficient documentation

## 2018-02-01 DIAGNOSIS — Z7982 Long term (current) use of aspirin: Secondary | ICD-10-CM | POA: Insufficient documentation

## 2018-02-01 DIAGNOSIS — R079 Chest pain, unspecified: Secondary | ICD-10-CM | POA: Diagnosis present

## 2018-02-01 DIAGNOSIS — I214 Non-ST elevation (NSTEMI) myocardial infarction: Principal | ICD-10-CM | POA: Insufficient documentation

## 2018-02-01 DIAGNOSIS — F17210 Nicotine dependence, cigarettes, uncomplicated: Secondary | ICD-10-CM | POA: Diagnosis not present

## 2018-02-01 HISTORY — DX: ST elevation (STEMI) myocardial infarction of unspecified site: I21.3

## 2018-02-01 LAB — BASIC METABOLIC PANEL
ANION GAP: 12 (ref 5–15)
BUN: 9 mg/dL (ref 6–20)
CHLORIDE: 103 mmol/L (ref 98–111)
CO2: 26 mmol/L (ref 22–32)
Calcium: 9.2 mg/dL (ref 8.9–10.3)
Creatinine, Ser: 0.97 mg/dL (ref 0.61–1.24)
GFR calc non Af Amer: >60 mL/min (ref >60)
Glucose, Bld: 146 mg/dL — ABNORMAL HIGH (ref 70–99)
Potassium: 3 mmol/L — ABNORMAL LOW (ref 3.5–5.1)
Sodium: 141 mmol/L (ref 135–145)

## 2018-02-01 LAB — CBC
HCT: 55.5 % — ABNORMAL HIGH (ref 40.0–52.0)
HEMOGLOBIN: 19 g/dL — AB (ref 13.0–18.0)
MCH: 39.3 pg — ABNORMAL HIGH (ref 26.0–34.0)
MCHC: 34.1 g/dL (ref 32.0–36.0)
MCV: 115.1 fL — ABNORMAL HIGH (ref 80.0–100.0)
Platelets: 143 10*3/uL — ABNORMAL LOW (ref 150–440)
RBC: 4.82 MIL/uL (ref 4.40–5.90)
RDW: 14.7 % — ABNORMAL HIGH (ref 11.5–14.5)
WBC: 9.6 10*3/uL (ref 3.8–10.6)

## 2018-02-01 LAB — TROPONIN I: Troponin I: 0.05 ng/mL (ref <0.03)

## 2018-02-01 MED ORDER — NITROGLYCERIN 2 % TD OINT
TOPICAL_OINTMENT | TRANSDERMAL | Status: AC
  Administered 2018-02-01: 0.5 [in_us] via TOPICAL
  Filled 2018-02-01: qty 1

## 2018-02-01 MED ORDER — NITROGLYCERIN 2 % TD OINT
0.5000 [in_us] | TOPICAL_OINTMENT | Freq: Once | TRANSDERMAL | Status: AC
  Administered 2018-02-01: 0.5 [in_us] via TOPICAL

## 2018-02-01 NOTE — ED Provider Notes (Signed)
[facility] Emergency Department Provider Note   First MD Initiated Contact with Patient 02/01/18 2332     (approximate)  I have reviewed the triage vital signs and the nursing notes.   HISTORY  Chief Complaint Chest Pain    HPI Cameron [name] is a 57 y.o. male with below list of chronic medical conditions including hypertension hyperlipidemia CAD status post 3 stent placements 2 and 2004 and subsequently 1 year and a half ago presents to the emergency department with persistent central chest pain x2 days which patient states radiates to his left arm.  Patient also admits to nausea and vomiting.  Patient denies any dyspnea.  Patient admits to lightheadedness however no dyspnea.  Patient denies any lower extremity pain or swelling.  Patient states current pain score is 4 out of 10 but at maximal intensity 10 out of 10.  Patient admits to taking 581 mg aspirins at home before arrival.   Past Medical History:  Diagnosis Date  . Coronary artery disease    a. 12/17 PCI with DES to pLAD with resdiual disease (RX therapy)  . Hyperlipidemia   . Hypertension     Patient Active Problem List   Diagnosis Date Noted  . Essential hypertension 06/20/2016  . Hyperlipidemia 06/20/2016  . CAD (coronary artery disease), native coronary artery 06/20/2016  . STEMI involving oth coronary artery of anterior wall (HCC) 06/17/2016  . STEMI (ST elevation myocardial infarction) (HCC) 06/17/2016    Past Surgical History:  Procedure Laterality Date  . CARDIAC CATHETERIZATION N/A 06/17/2016   Procedure: Left Heart Cath and Coronary Angiography;  Surgeon: Runell Gess, MD;  Location: [facility] INVASIVE CV LAB;  Service: Cardiovascular;  Laterality: N/A;  . CARDIAC CATHETERIZATION N/A 06/17/2016   Procedure: Coronary Stent Intervention;  Surgeon: Runell Gess, MD;  Location: [facility] INVASIVE CV LAB;  Service: Cardiovascular;  Laterality: N/A;  . cardiac stents      Prior to  Admission medications   Medication Sig Start Date End Date Taking? Authorizing Provider  aspirin EC 81 MG tablet Take 81 mg by mouth daily.    [provider]  atorvastatin (LIPITOR) 80 MG tablet Take 1 tablet (80 mg total) by mouth daily at 6 PM. Patient taking differently: Take 80 mg by mouth daily.  06/20/16   Arty Baumgartner, NP  carvedilol (COREG) 6.25 MG tablet Take 1 tablet (6.25 mg total) by mouth 2 (two) times daily with a meal. 06/20/16   Arty Baumgartner, NP  clopidogrel (PLAVIX) 75 MG tablet Take 1 tablet (75 mg total) by mouth daily. 06/20/16   Arty Baumgartner, NP  ELIQUIS 5 MG TABS tablet TAKE 1 TABLET (5 MG TOTAL) BY MOUTH 2 (TWO) TIMES DAILY. PLEASE SCHEDULE APPOINTMENT FOR REFILLS. 04/27/17   Runell Gess, MD  furosemide (LASIX) 20 MG tablet TAKE 1 TABLET BY MOUTH EVERY DAY AS NEEDED 12/14/17   Runell Gess, MD  lisinopril (PRINIVIL,ZESTRIL) 10 MG tablet Take 1 tablet (10 mg total) by mouth daily. 06/20/16   Arty Baumgartner, NP  nitroGLYCERIN (NITROSTAT) 0.4 MG SL tablet Place 1 tablet (0.4 mg total) under the tongue every 5 (five) minutes as needed. 06/20/16   Arty Baumgartner, NP  ondansetron (ZOFRAN ODT) 4 MG disintegrating tablet Take 1 tablet (4 mg total) by mouth every 6 (six) hours as needed for nausea or vomiting. 03/09/17   Sharyn Creamer, MD  ondansetron (ZOFRAN) 4 MG tablet Take 1 tablet (4 mg total)  by mouth daily as needed for nausea or vomiting. Patient not taking: Reported on 03/09/2017 02/12/17 02/12/18  Willy Eddy, MD  oxyCODONE (OXY IR/ROXICODONE) 5 MG immediate release tablet Take 1 tablet (5 mg total) by mouth every 6 (six) hours as needed for severe pain. 03/09/17   Sharyn Creamer, MD  tamsulosin (FLOMAX) 0.4 MG CAPS capsule Take 1 capsule (0.4 mg total) by mouth daily. 03/09/17   Sharyn Creamer, MD  traMADol (ULTRAM) 50 MG tablet Take 1 tablet (50 mg total) by mouth every 6 (six) hours as needed. Patient not taking: Reported on 03/09/2017 02/12/17  02/12/18  Willy Eddy, MD    Allergies No known drug allergies No family history on file.  Social History Social History   Tobacco Use  . Smoking status: Current Every Day Smoker    Packs/day: 1.00    Types: Cigarettes  . Smokeless tobacco: Never Used  Substance Use Topics  . Alcohol use: Yes    Comment: weekends  . Drug use: Yes    Types: Marijuana    Comment: within the week    Review of Systems Constitutional: No fever/chills Eyes: No visual changes. ENT: No sore throat. Cardiovascular: Positive for chest pain. Respiratory: Denies shortness of breath. Gastrointestinal: No abdominal pain.  No nausea, no vomiting.  No diarrhea.  No constipation. Genitourinary: Negative for dysuria. Musculoskeletal: Negative for neck pain.  Negative for back pain. Integumentary: Negative for rash. Neurological: Negative for headaches, focal weakness or numbness.   ____________________________________________   PHYSICAL EXAM:  VITAL SIGNS: ED Triage Vitals  Enc Vitals Group     BP 02/01/18 2231 (!) 182/110     Pulse Rate 02/01/18 2231 92     Resp 02/01/18 2231 20     Temp 02/01/18 2231 98.2 F (36.8 C)     Temp Source 02/01/18 2231 Oral     SpO2 02/01/18 2231 96 %     Weight 02/01/18 2347 83.9 kg (185 lb)     Height 02/01/18 2347 1.702 m (5\' 7" )     Head Circumference --      Peak Flow --      Pain Score 02/01/18 2245 3     Pain Loc --      Pain Edu? --      Excl. in GC? --     Constitutional: Alert and oriented. Well appearing and in no acute distress. Eyes: Conjunctivae are normal.  Head: Atraumatic. Mouth/Throat: Mucous membranes are moist.  Oropharynx non-erythematous. Neck: No stridor.  Cardiovascular: Normal rate, regular rhythm. Good peripheral circulation. Grossly normal heart sounds. Respiratory: Normal respiratory effort.  No retractions. Lungs CTAB. Gastrointestinal: Soft and nontender. No distention.  Musculoskeletal: No lower extremity tenderness  nor edema. No gross deformities of extremities. Neurologic:  Normal speech and language. No gross focal neurologic deficits are appreciated.  Skin:  Skin is warm, dry and intact. No rash noted. Psychiatric: Mood and affect are normal. Speech and behavior are normal.  ____________________________________________   LABS (all labs ordered are listed, but only abnormal results are displayed)  Labs Reviewed  CBC - Abnormal; Notable for the following components:      Result Value   Hemoglobin 19.0 (*)    HCT 55.5 (*)    MCV 115.1 (*)    MCH 39.3 (*)    RDW 14.7 (*)    Platelets 143 (*)    All other components within normal limits  BASIC METABOLIC PANEL - Abnormal; Notable for the following components:   Potassium  3.0 (*)    Glucose, Bld 146 (*)    All other components within normal limits  TROPONIN I - Abnormal; Notable for the following components:   Troponin I 0.05 (*)    All other components within normal limits   ____________________________________________  EKG  ED ECG REPORT I, [place] N Rashel Okeefe, the attending physician, personally viewed and interpreted this ECG.   Date: 02/01/2018  EKG Time: 10:26 PM  Rate: 89  Rhythm: Normal Sinus Rhythm  Axis: Normal  Intervals:Normal  ST&T Change: None  ____________________________________________  RADIOLOGY I, Lee N Rashiya Lofland, personally viewed and evaluated these images (plain radiographs) as part of my medical decision making, as well as reviewing the written report by the radiologist.  ED MD interpretation: No evidence of cardiopulmonary disease on chest x-ray per radiologist.  Official radiology report(s): Dg Chest 2 View  Result Date: 02/01/2018 CLINICAL DATA:  Chest pain, numbness EXAM: CHEST - 2 VIEW COMPARISON:  06/17/2016 FINDINGS: Lungs are clear.  No pleural effusion or pneumothorax. The heart is normal in size.  Coronary stents. Mild degenerative changes of the visualized thoracolumbar spine. IMPRESSION: No  evidence of acute cardiopulmonary disease. Electronically Signed   By: Charline Bills M.D.   On: 02/01/2018 22:56     Procedures   ____________________________________________   INITIAL IMPRESSION / ASSESSMENT AND PLAN / ED COURSE  As part of my medical decision making, I reviewed the following data within the electronic MEDICAL RECORD NUMBER   57 year old male presenting to the emergency department with ongoing chest pain x2 days with known coronary artery disease.  Reviewed the patient's catheterization that was performed on 06/17/2016 revealed proximal to mid circumflex 100% stenosis ostial LAD the mid LAD 100% stenosis ostial first diagonal to first diagonal, 60% right RCA ostium, 50% mid RCA 75% distal RCA at that time a proximal LAD drug-eluting stent was placed with concern for later staged circumflex intervention plus RCA intervention.  I question of patient's discomfort today secondary to progression of stenosis in any of the beforementioned vessels.  Patient's laboratory data noted for an elevated troponin at 0.05.  EKG revealed no evidence of ST segment elevation or depression however right atrial enlargement noted most likely secondary to the patient's known long term tobacco use.  Patient discussed with Dr. Anne Hahn for hospital admission further evaluation and management. ____________________________________________  FINAL CLINICAL IMPRESSION(S) / ED DIAGNOSES  Final diagnoses:  NSTEMI (non-ST elevated myocardial infarction) [facility])     MEDICATIONS GIVEN DURING THIS VISIT:  Medications - No data to display   ED Discharge Orders    None       Note:  This document was prepared using Dragon voice recognition software and may include unintentional dictation errors.    Darci Current, MD 02/02/18 [phone]

## 2018-02-01 NOTE — ED Triage Notes (Signed)
Pt arrives to ED via POV with c/o CP x2 days with radiation into the left arm. Pt reports (+) N/V, denies SHOB. Pt reports some lightheadedness but no dizziness or syncope. Pt states h/x of 3 stents placed in the past.

## 2018-02-02 ENCOUNTER — Observation Stay
Admit: 2018-02-02 | Discharge: 2018-02-02 | Disposition: A | Discharge status: Home / Self Care | Payer: Commercial Managed Care - PPO | Attending: Cardiology | Admitting: Cardiology

## 2018-02-02 ENCOUNTER — Observation Stay: Payer: Commercial Managed Care - PPO

## 2018-02-02 ENCOUNTER — Encounter: Payer: Self-pay | Admitting: Internal Medicine

## 2018-02-02 DIAGNOSIS — R079 Chest pain, unspecified: Secondary | ICD-10-CM | POA: Diagnosis present

## 2018-02-02 LAB — NM MYOCAR MULTI W/SPECT W/WALL MOTION / EF
CHL CUP MPHR: 164 {beats}/min
CHL CUP NUCLEAR SDS: 0
CSEPED: 1 min
Estimated workload: 1 METS
Exercise duration (sec): 0 s
LV dias vol: 221 mL (ref 62–150)
LV sys vol: 142 mL
Peak HR: 85 {beats}/min
Percent HR: 51 %
Rest HR: 66 {beats}/min
SRS: 33
SSS: 26
TID: 1.08

## 2018-02-02 LAB — BASIC METABOLIC PANEL
Anion gap: 9 (ref 5–15)
BUN: 10 mg/dL (ref 6–20)
CALCIUM: 9.2 mg/dL (ref 8.9–10.3)
CO2: 26 mmol/L (ref 22–32)
CREATININE: 0.82 mg/dL (ref 0.61–1.24)
Chloride: 106 mmol/L (ref 98–111)
GFR calc Af Amer: >60 mL/min (ref >60)
GLUCOSE: 113 mg/dL — AB (ref 70–99)
Potassium: 3.4 mmol/L — ABNORMAL LOW (ref 3.5–5.1)
SODIUM: 141 mmol/L (ref 135–145)

## 2018-02-02 LAB — HEMOGLOBIN A1C
Hgb A1c MFr Bld: 5.1 % (ref 4.8–5.6)
Mean Plasma Glucose: 99.67 mg/dL

## 2018-02-02 LAB — TSH: TSH: 1.973 u[IU]/mL (ref 0.350–4.500)

## 2018-02-02 LAB — TROPONIN I
Troponin I: 0.05 ng/mL (ref <0.03)
Troponin I: 0.04 ng/mL (ref <0.03)
Troponin I: 0.04 ng/mL (ref <0.03)

## 2018-02-02 LAB — MAGNESIUM: MAGNESIUM: 1.7 mg/dL (ref 1.7–2.4)

## 2018-02-02 MED ORDER — ACETAMINOPHEN 650 MG RE SUPP: 650.0000 mg | Freq: Four times a day (QID) | RECTAL | Status: DC | PRN

## 2018-02-02 MED ORDER — TECHNETIUM TC 99M TETROFOSMIN IV KIT
30.2670 | PACK | Freq: Once | INTRAVENOUS | Status: AC | PRN
  Administered 2018-02-02: 30.267 via INTRAVENOUS

## 2018-02-02 MED ORDER — POTASSIUM CHLORIDE CRYS ER 20 MEQ PO TBCR
20.0000 meq | EXTENDED_RELEASE_TABLET | Freq: Once | ORAL | Status: AC
  Administered 2018-02-02: 20 meq via ORAL
  Filled 2018-02-02: qty 1

## 2018-02-02 MED ORDER — NICOTINE 21 MG/24HR TD PT24
21.0000 mg | MEDICATED_PATCH | Freq: Every day | TRANSDERMAL | Status: DC
  Administered 2018-02-02 – 2018-02-03 (×2): 21 mg via TRANSDERMAL
  Filled 2018-02-02 (×2): qty 1

## 2018-02-02 MED ORDER — ONDANSETRON HCL 4 MG PO TABS: 4.0000 mg | ORAL_TABLET | Freq: Four times a day (QID) | ORAL | Status: DC | PRN

## 2018-02-02 MED ORDER — LISINOPRIL 20 MG PO TABS
20.0000 mg | ORAL_TABLET | Freq: Every day | ORAL | Status: DC
  Administered 2018-02-02 – 2018-02-03 (×2): 20 mg via ORAL
  Filled 2018-02-02 (×2): qty 1

## 2018-02-02 MED ORDER — HYDRALAZINE HCL 20 MG/ML IJ SOLN: 10.0000 mg | Freq: Four times a day (QID) | INTRAMUSCULAR | Status: DC | PRN

## 2018-02-02 MED ORDER — ONDANSETRON HCL 4 MG/2ML IJ SOLN: 4.0000 mg | Freq: Four times a day (QID) | INTRAMUSCULAR | Status: DC | PRN

## 2018-02-02 MED ORDER — DOCUSATE SODIUM 100 MG PO CAPS
100.0000 mg | ORAL_CAPSULE | Freq: Two times a day (BID) | ORAL | Status: DC
  Administered 2018-02-02 – 2018-02-03 (×2): 100 mg via ORAL
  Filled 2018-02-02 (×3): qty 1

## 2018-02-02 MED ORDER — CARVEDILOL 6.25 MG PO TABS
6.2500 mg | ORAL_TABLET | Freq: Two times a day (BID) | ORAL | Status: DC
  Administered 2018-02-02 – 2018-02-03 (×4): 6.25 mg via ORAL
  Filled 2018-02-02 (×5): qty 1

## 2018-02-02 MED ORDER — NITROGLYCERIN 0.4 MG SL SUBL: 0.4000 mg | SUBLINGUAL_TABLET | SUBLINGUAL | Status: DC | PRN

## 2018-02-02 MED ORDER — REGADENOSON 0.4 MG/5ML IV SOLN
0.4000 mg | Freq: Once | INTRAVENOUS | Status: AC
  Administered 2018-02-02: 0.4 mg via INTRAVENOUS

## 2018-02-02 MED ORDER — SODIUM CHLORIDE 0.9% FLUSH
3.0000 mL | Freq: Two times a day (BID) | INTRAVENOUS | Status: DC
  Administered 2018-02-02 – 2018-02-03 (×2): 3 mL via INTRAVENOUS

## 2018-02-02 MED ORDER — TECHNETIUM TC 99M TETROFOSMIN IV KIT
13.0000 | PACK | Freq: Once | INTRAVENOUS | Status: AC | PRN
  Administered 2018-02-02: 13.73 via INTRAVENOUS

## 2018-02-02 MED ORDER — POTASSIUM CHLORIDE CRYS ER 20 MEQ PO TBCR
40.0000 meq | EXTENDED_RELEASE_TABLET | ORAL | Status: AC
Start: 2018-02-02 — End: 2018-02-02
  Administered 2018-02-02: 40 meq via ORAL
  Filled 2018-02-02: qty 2

## 2018-02-02 MED ORDER — ACETAMINOPHEN 325 MG PO TABS
650.0000 mg | ORAL_TABLET | Freq: Four times a day (QID) | ORAL | Status: DC | PRN
  Administered 2018-02-02: 650 mg via ORAL
  Filled 2018-02-02: qty 2

## 2018-02-02 MED ORDER — ATORVASTATIN CALCIUM 20 MG PO TABS
80.0000 mg | ORAL_TABLET | Freq: Every day | ORAL | Status: DC
  Administered 2018-02-02 – 2018-02-03 (×2): 80 mg via ORAL
  Filled 2018-02-02 (×2): qty 4

## 2018-02-02 MED ORDER — ASPIRIN EC 81 MG PO TBEC
81.0000 mg | DELAYED_RELEASE_TABLET | Freq: Every day | ORAL | Status: DC
  Administered 2018-02-02 – 2018-02-03 (×2): 81 mg via ORAL
  Filled 2018-02-02 (×2): qty 1

## 2018-02-02 MED ORDER — APIXABAN 5 MG PO TABS: 5.0000 mg | ORAL_TABLET | Freq: Two times a day (BID) | ORAL | Status: DC

## 2018-02-02 NOTE — ED Notes (Signed)
Nitro paste taken off at this time due per Dr. Manson Passey. (Patients BP is 118/85)

## 2018-02-02 NOTE — H&P (Signed)
Cameron [name] is an 57 y.o. male.   Chief Complaint: Chest pain HPI: Patient with past medical history of coronary artery disease status post STEMI and PCI presents the emergency department complaining of chest pain.  The pain began more than 24 hours ago and the patient awoke and felt his left fourth and fifth digits to be numb.  He reports that this is the same constellation of symptoms he had prior to his heart attack.  He also reports feeling a heaviness or pressure on his chest particularly when he takes a deep breath.  Laboratory evaluation showed mildly elevated troponin.  Due to his risk factors the emergency department staff called the hospitalist service for admission  Past Medical History:  Diagnosis Date  . Coronary artery disease    a. 12/17 PCI with DES to pLAD with resdiual disease (RX therapy)  . Hyperlipidemia   . Hypertension   . STEMI (ST elevation myocardial infarction) (HCC) 2017    Past Surgical History:  Procedure Laterality Date  . CARDIAC CATHETERIZATION N/A 06/17/2016   Procedure: Left Heart Cath and Coronary Angiography;  Surgeon: Jonathan J Berry, MD;  Location: [facility] INVASIVE CV LAB;  Service: Cardiovascular;  Laterality: N/A;  . CARDIAC CATHETERIZATION N/A 06/17/2016   Procedure: Coronary Stent Intervention;  Surgeon: Jonathan J Berry, MD;  Location: [facility] INVASIVE CV LAB;  Service: Cardiovascular;  Laterality: N/A;  . cardiac stents      Family History  Problem Relation Age of Onset  . CAD Paternal Grandfather    Social History:  reports that he has been smoking cigarettes.  He has been smoking about 1.00 pack per day. He has never used smokeless tobacco. He reports that he drinks alcohol. He reports that he has current or past drug history. Drug: Marijuana.  Allergies: No Known Allergies  Medications Prior to Admission  Medication Sig Dispense Refill  . carvedilol (COREG) 6.25 MG tablet Take 1 tablet (6.25 mg total) by mouth 2 (two) times daily with a  meal. 60 tablet 6  . ELIQUIS 5 MG TABS tablet TAKE 1 TABLET (5 MG TOTAL) BY MOUTH 2 (TWO) TIMES DAILY. PLEASE SCHEDULE APPOINTMENT FOR REFILLS. 60 tablet 0  . lisinopril (PRINIVIL,ZESTRIL) 20 MG tablet Take 20 mg by mouth daily.  4  . nitroGLYCERIN (NITROSTAT) 0.4 MG SL tablet Place 1 tablet (0.4 mg total) under the tongue every 5 (five) minutes as needed. 25 tablet 3  . atorvastatin (LIPITOR) 80 MG tablet Take 1 tablet (80 mg total) by mouth daily at 6 PM. (Patient not taking: Reported on 02/02/2018) 30 tablet 6  . clopidogrel (PLAVIX) 75 MG tablet Take 1 tablet (75 mg total) by mouth daily. (Patient not taking: Reported on 02/02/2018) 30 tablet 11  . furosemide (LASIX) 20 MG tablet TAKE 1 TABLET BY MOUTH EVERY DAY AS NEEDED (Patient not taking: Reported on 02/02/2018) 30 tablet 0  . lisinopril (PRINIVIL,ZESTRIL) 10 MG tablet Take 1 tablet (10 mg total) by mouth daily. (Patient not taking: Reported on 02/02/2018) 30 tablet 6  . ondansetron (ZOFRAN ODT) 4 MG disintegrating tablet Take 1 tablet (4 mg total) by mouth every 6 (six) hours as needed for nausea or vomiting. (Patient not taking: Reported on 02/02/2018) 20 tablet 0  . ondansetron (ZOFRAN) 4 MG tablet Take 1 tablet (4 mg total) by mouth daily as needed for nausea or vomiting. (Patient not taking: Reported on 03/09/2017) 14 tablet 0  . oxyCODONE (OXY IR/ROXICODONE) 5 MG immediate release tablet Take 1 tablet (5   mg total) by mouth every 6 (six) hours as needed for severe pain. (Patient not taking: Reported on 02/02/2018) 10 tablet 0  . tamsulosin (FLOMAX) 0.4 MG CAPS capsule Take 1 capsule (0.4 mg total) by mouth daily. (Patient not taking: Reported on 02/02/2018) 7 capsule 0  . traMADol (ULTRAM) 50 MG tablet Take 1 tablet (50 mg total) by mouth every 6 (six) hours as needed. (Patient not taking: Reported on 03/09/2017) 20 tablet 0    Results for orders placed or performed during the hospital encounter of 02/01/18 (from the past 48 hour(s))  CBC      Status: Abnormal   Collection Time: 02/01/18 10:31 PM  Result Value Ref Range   WBC 9.6 3.8 - 10.6 K/uL   RBC 4.82 4.40 - 5.90 MIL/uL   Hemoglobin 19.0 (H) 13.0 - 18.0 g/dL   HCT 55.5 (H) 40.0 - 52.0 %   MCV 115.1 (H) 80.0 - 100.0 fL   MCH 39.3 (H) 26.0 - 34.0 pg   MCHC 34.1 32.0 - 36.0 g/dL   RDW 14.7 (H) 11.5 - 14.5 %   Platelets 143 (L) 150 - 440 K/uL    Comment: Performed at [facility], [address] [place], [place] [address]  Basic metabolic panel     Status: Abnormal   Collection Time: 02/01/18 10:31 PM  Result Value Ref Range   Sodium 141 135 - 145 mmol/L   Potassium 3.0 (L) 3.5 - 5.1 mmol/L   Chloride 103 98 - 111 mmol/L   CO2 26 22 - 32 mmol/L   Glucose, Bld 146 (H) 70 - 99 mg/dL   BUN 9 6 - 20 mg/dL   Creatinine, Ser 0.97 0.61 - 1.24 mg/dL   Calcium 9.2 8.9 - 10.3 mg/dL   GFR calc non Af Amer >60 >60 mL/min   GFR calc Af Amer >60 >60 mL/min    Comment: (NOTE) The eGFR has been calculated using the CKD EPI equation. This calculation has not been validated in all clinical situations. eGFR's persistently <60 mL/min signify possible Chronic Kidney Disease.    Anion gap 12 5 - 15    Comment: Performed at [facility], [address] [place], [place] [address]  Troponin I     Status: Abnormal   Collection Time: 02/01/18 10:31 PM  Result Value Ref Range   Troponin I 0.05 (HH) <0.03 ng/mL    Comment: CRITICAL RESULT CALLED TO, READ BACK BY AND VERIFIED WITH LISA THOMPSON _0  02/01/18 [facility] Performed at [facility], [address] [place], [place] [address]   TSH     Status: None   Collection Time: 02/02/18  3:38 AM  Result Value Ref Range   TSH 1.973 0.350 - 4.500 uIU/mL    Comment: Performed by a 3rd Generation assay with a functional sensitivity of <=0.01 uIU/mL. Performed at [facility], [address] [place], [place] [address]   Troponin I     Status: Abnormal   Collection Time: 02/02/18  3:38 AM  Result Value  Ref Range   Troponin I 0.05 (HH) <0.03 ng/mL    Comment: CRITICAL VALUE NOTED. VALUE IS CONSISTENT WITH PREVIOUSLY REPORTED/CALLED VALUE  SDR Performed at [facility], [address] [place], [place] [address]    Dg Chest 2 View  Result Date: 02/01/2018 CLINICAL DATA:  Chest pain, numbness EXAM: CHEST - 2 VIEW COMPARISON:  06/17/2016 FINDINGS: Lungs are clear.  No pleural effusion or pneumothorax. The heart is normal in  size.  Coronary stents. Mild degenerative changes of the visualized thoracolumbar spine. IMPRESSION: No evidence of acute cardiopulmonary disease. Electronically Signed   By: Julian Hy M.D.   On: 02/01/2018 22:56    Review of Systems  Constitutional: Negative for chills and fever.  HENT: Negative for sore throat and tinnitus.   Eyes: Negative for blurred vision and redness.  Respiratory: Negative for cough and shortness of breath.   Cardiovascular: Positive for chest pain. Negative for palpitations, orthopnea and PND.  Gastrointestinal: Positive for nausea (24 hrs ago). Negative for abdominal pain, diarrhea and vomiting.  Genitourinary: Negative for dysuria, frequency and urgency.  Musculoskeletal: Negative for joint pain and myalgias.  Skin: Negative for rash.       No lesions  Neurological: Negative for speech change, focal weakness and weakness.  Endo/Heme/Allergies: Does not bruise/bleed easily.       No temperature intolerance  Psychiatric/Behavioral: Negative for depression and suicidal ideas.    Blood pressure (!) 155/105, pulse 73, temperature 97.7 F (36.5 C), temperature source Oral, resp. rate 10, height 5' 7" (1.702 m), weight 83.4 kg (183 lb 14.4 oz), SpO2 96 %. Physical Exam  Vitals reviewed. Constitutional: He is oriented to person, place, and time. He appears well-developed and well-nourished. No distress.  HENT:  Head: Normocephalic and atraumatic.  Mouth/Throat: Oropharynx is clear and moist.  Eyes: Pupils are equal, round,  and reactive to light. Conjunctivae and EOM are normal. No scleral icterus.  Neck: Normal range of motion. Neck supple. No JVD present. No tracheal deviation present. No thyromegaly present.  Cardiovascular: Normal rate, regular rhythm and normal heart sounds. Exam reveals no gallop and no friction rub.  No murmur heard. Respiratory: Effort normal and breath sounds normal. No respiratory distress.  GI: Soft. Bowel sounds are normal. He exhibits no distension. There is no tenderness.  Genitourinary:  Genitourinary Comments: Deferred  Musculoskeletal: Normal range of motion. He exhibits no edema.  Lymphadenopathy:    He has no cervical adenopathy.  Neurological: He is alert and oriented to person, place, and time. No cranial nerve deficit.  Skin: Skin is warm and dry. No rash noted. No erythema.  Psychiatric: He has a normal mood and affect. His behavior is normal. Judgment and thought content normal.     Assessment/Plan This is a 57 year old male admitted for chest pain. 1.  Chest pain: Concerning for anginal pain given history of ACS.  Continue to follow cardiac biomarkers.  Monitor telemetry.  Consult cardiology. 2.  Coronary artery disease: Continue aspirin.  Nitroglycerin as needed for pain. 3.  Hypertension: Uncontrolled; continue lisinopril and carvedilol.  Hydralazine as needed 4.  Hyperlipidemia: Continue statin therapy 5.  DVT prophylaxis: [place] 6.  GI prophylaxis: None The patient is a full code.  Time spent on admission orders and patient care approximately 45 minutes  Harrie Foreman, MD 02/02/2018, 7:38 AM

## 2018-02-02 NOTE — Progress Notes (Addendum)
[employer] - [place] at [facility]                                                                                                                                                                                  Patient Demographics   Cameron [name], is a 57 y.o. male, DOB - 05/05/1961, ZOX:[id number]  Admit date - 02/01/2018   Admitting Physician Arnaldo Natal, MD  Outpatient Primary MD for the patient is Patient, No Pcp Per   LOS - 0  Subjective: Patient states that he is still having some chest pain.  Denies any shortness of breath had a stress test earlier    Review of Systems:   CONSTITUTIONAL: No documented fever. No fatigue, weakness. No weight gain, no weight loss.  EYES: No blurry or double vision.  ENT: No tinnitus. No postnasal drip. No redness of the oropharynx.  RESPIRATORY: No cough, no wheeze, no hemoptysis. No dyspnea.  CARDIOVASCULAR: Positive chest pain. No orthopnea. No palpitations. No syncope.  GASTROINTESTINAL: No nausea, no vomiting or diarrhea. No abdominal pain. No melena or hematochezia.  GENITOURINARY: No dysuria or hematuria.  ENDOCRINE: No polyuria or nocturia. No heat or cold intolerance.  HEMATOLOGY: No anemia. No bruising. No bleeding.  INTEGUMENTARY: No rashes. No lesions.  MUSCULOSKELETAL: No arthritis. No swelling. No gout.  NEUROLOGIC: No numbness, tingling, or ataxia. No seizure-type activity.  PSYCHIATRIC: No anxiety. No insomnia. No ADD.    Vitals:   Vitals:   02/02/18 0300 02/02/18 0330 02/02/18 0412 02/02/18 0759  BP: 113/86 130/87 (!) 155/105 115/82  Pulse: 76 66 73 60  Resp: 10 10    Temp:   97.7 F (36.5 C) 98.3 F (36.8 C)  TempSrc:   Oral Oral  SpO2: 92% 94% 96% 94%  Weight:   83.4 kg (183 lb 14.4 oz)   Height:   5\' 7"  (1.702 m)     Wt Readings from Last 3 Encounters:  02/02/18 83.4 kg (183 lb 14.4 oz)  03/09/17 90.7 kg (200 lb)  03/05/17 90.7 kg (200 lb)    No intake or output data in the 24  hours ending 02/02/18 1325  Physical Exam:   GENERAL: Pleasant-appearing in no apparent distress.  HEAD, EYES, EARS, NOSE AND THROAT: Atraumatic, normocephalic. Extraocular muscles are intact. Pupils equal and reactive to light. Sclerae anicteric. No conjunctival injection. No oro-pharyngeal erythema.  NECK: Supple. There is no jugular venous distention. No bruits, no lymphadenopathy, no thyromegaly.  HEART: Regular rate and rhythm,. No murmurs, no rubs, no clicks.  LUNGS: Clear to auscultation bilaterally. No rales or rhonchi. No wheezes.  ABDOMEN: Soft, flat, nontender, nondistended. Has good bowel sounds. No hepatosplenomegaly  appreciated.  EXTREMITIES: No evidence of any cyanosis, clubbing, or peripheral edema.  +2 pedal and radial pulses bilaterally.  NEUROLOGIC: The patient is alert, awake, and oriented x3 with no focal motor or sensory deficits appreciated bilaterally.  SKIN: Moist and warm with no rashes appreciated.  Psych: Not anxious, depressed LN: No inguinal LN enlargement    Antibiotics   Anti-infectives (From admission, onward)   None      Medications   Scheduled Meds: . aspirin EC  81 mg Oral Daily  . carvedilol  6.25 mg Oral BID WC  . docusate sodium  100 mg Oral BID  . lisinopril  20 mg Oral Daily  . nicotine  21 mg Transdermal Daily   Continuous Infusions: PRN Meds:.acetaminophen **OR** acetaminophen, hydrALAZINE, nitroGLYCERIN, ondansetron **OR** ondansetron (ZOFRAN) IV   Data Review:   Micro Results No results found for this or any previous visit (from the past 240 hour(s)).  Radiology Reports Dg Chest 2 View  Result Date: 02/01/2018 CLINICAL DATA:  Chest pain, numbness EXAM: CHEST - 2 VIEW COMPARISON:  06/17/2016 FINDINGS: Lungs are clear.  No pleural effusion or pneumothorax. The heart is normal in size.  Coronary stents. Mild degenerative changes of the visualized thoracolumbar spine. IMPRESSION: No evidence of acute cardiopulmonary disease.  Electronically Signed   By: Charline Bills M.D.   On: 02/01/2018 22:56     CBC Recent Labs  Lab 02/01/18 2231  WBC 9.6  HGB 19.0*  HCT 55.5*  PLT 143*  MCV 115.1*  MCH 39.3*  MCHC 34.1  RDW 14.7*    Chemistries  Recent Labs  Lab 02/01/18 2231 02/02/18 1232  NA 141 141  K 3.0* 3.4*  CL 103 106  CO2 26 26  GLUCOSE 146* 113*  BUN 9 10  CREATININE 0.97 0.82  CALCIUM 9.2 9.2  MG  --  1.7   ------------------------------------------------------------------------------------------------------------------ estimated creatinine clearance is 103.9 mL/min (by C-G formula based on SCr of 0.82 mg/dL). ------------------------------------------------------------------------------------------------------------------ Recent Labs    02/01/18 2231  HGBA1C 5.1   ------------------------------------------------------------------------------------------------------------------ No results for input(s): CHOL, HDL, LDLCALC, TRIG, CHOLHDL, LDLDIRECT in the last 72 hours. ------------------------------------------------------------------------------------------------------------------ Recent Labs    02/02/18 0338  TSH 1.973   ------------------------------------------------------------------------------------------------------------------ No results for input(s): VITAMINB12, FOLATE, FERRITIN, TIBC, IRON, RETICCTPCT in the last 72 hours.  Coagulation profile No results for input(s): INR, PROTIME in the last 168 hours.  No results for input(s): DDIMER in the last 72 hours.  Cardiac Enzymes Recent Labs  Lab 02/01/18 2231 02/02/18 0338 02/02/18 1232  TROPONINI 0.05* 0.05* 0.04*   ------------------------------------------------------------------------------------------------------------------ Invalid input(s): POCBNP    Assessment & Plan   1.  Chest pain: Concerning for anginal pain given history of ACS. Status post stress test await results may need cardiac  catheterization Continue therapy with aspirin, Coreg Plavix on hold for possible cath 2.  Coronary artery disease: Continue aspirin.  Nitroglycerin as needed for pain. 3.  Hypertension: Uncontrolled; continue lisinopril and carvedilol.  Hydralazine as needed pressure improved 4.  Hyperlipidemia: Continue statin therapy 5.  DVT prophylaxis: SCDs 6.  Nicotine abuse smoking cessation 4 minutes spent strongly recommend he stop smoking nicotine patch will be started       Code Status Orders  (From admission, onward)        Start     Ordered   02/02/18 0415  Full code  Continuous     02/02/18 0414    Code Status History    Date Active Date Inactive Code Status Order  ID Comments User Context   06/17/2016 0630 06/20/2016 1840 Full Code [id number]  Runell Gess, MD Inpatient           Consultscardiogly  DVT Prophylaxis SCDs  Lab Results  Component Value Date   PLT 143 (L) 02/01/2018     Time Spent in minutes   35 minutes greater than 50% of time spent in care coordination and counseling patient regarding the condition and plan of care.   Auburn Bilberry M.D on 02/02/2018 at 1:25 PM  Between 7am to 6pm - Pager - [phone]  After 6pm go to [email or web address] - Social research officer, government  [employer]   Office  [phone]

## 2018-02-02 NOTE — Care Management (Signed)
No discharge needs identified by members of the care team 

## 2018-02-02 NOTE — Consult Note (Signed)
[facility] Cardiology  CARDIOLOGY CONSULT NOTE  Patient ID: Cameron [name] MRN: [id number] DOB/AGE: 1960/07/14 57 y.o.  Admit date: 02/01/2018 Referring Physician Allena Katz Primary Physician Virk Primary Cardiologist [name] Reason for Consultation chest pain  HPI: 57 year old gentleman referred for evaluation of chest pain.  Patient has known coronary disease, status post stent bifurcation LAD/D1, and left circumflex 2006, status post anterior STEMI, DES LAD 06/17/2016 withoccluded mid left circumflex, 75% stenosis distal RCA, LVEF 25 to 35%.  Patient presents with 2 to 3-day history of intermittent chest discomfort, described as heaviness, rated 2-3 over 10, tingling in his left fourth and fifth fingers.  Emergency room, ECG revealed sinus rhythm with old anteroseptal MI.  Troponin was 0.03, followed by 0.05, 0.05.  She has had no recurrent chest pain following hospitalization.  Early is on Eliquis with history of apical thrombus.  Most recent 2D echocardiogram 04/06/2017 revealed LVEF of 30%, moderate mitral and tricuspid regurgitation, without apparent apical thrombus.  Review of systems complete and found to be negative unless listed above     Past Medical History:  Diagnosis Date  . Coronary artery disease    a. 12/17 PCI with DES to pLAD with resdiual disease (RX therapy)  . Hyperlipidemia   . Hypertension   . STEMI (ST elevation myocardial infarction) (HCC) 2017    Past Surgical History:  Procedure Laterality Date  . CARDIAC CATHETERIZATION N/A 06/17/2016   Procedure: Left Heart Cath and Coronary Angiography;  Surgeon: Runell Gess, MD;  Location: [facility] INVASIVE CV LAB;  Service: Cardiovascular;  Laterality: N/A;  . CARDIAC CATHETERIZATION N/A 06/17/2016   Procedure: Coronary Stent Intervention;  Surgeon: Runell Gess, MD;  Location: [facility] INVASIVE CV LAB;  Service: Cardiovascular;  Laterality: N/A;  . cardiac stents      Medications Prior to Admission  Medication Sig Dispense  Refill Last Dose  . carvedilol (COREG) 6.25 MG tablet Take 1 tablet (6.25 mg total) by mouth 2 (two) times daily with a meal. 60 tablet 6 02/01/2018 at 1600  . ELIQUIS 5 MG TABS tablet TAKE 1 TABLET (5 MG TOTAL) BY MOUTH 2 (TWO) TIMES DAILY. PLEASE SCHEDULE APPOINTMENT FOR REFILLS. 60 tablet 0 02/01/2018 at 1600  . lisinopril (PRINIVIL,ZESTRIL) 20 MG tablet Take 20 mg by mouth daily.  4 02/01/2018 at Unknown time  . nitroGLYCERIN (NITROSTAT) 0.4 MG SL tablet Place 1 tablet (0.4 mg total) under the tongue every 5 (five) minutes as needed. 25 tablet 3 prn at prn  . atorvastatin (LIPITOR) 80 MG tablet Take 1 tablet (80 mg total) by mouth daily at 6 PM. (Patient not taking: Reported on 02/02/2018) 30 tablet 6 Not Taking at Unknown time  . clopidogrel (PLAVIX) 75 MG tablet Take 1 tablet (75 mg total) by mouth daily. (Patient not taking: Reported on 02/02/2018) 30 tablet 11 Not Taking at Unknown time  . furosemide (LASIX) 20 MG tablet TAKE 1 TABLET BY MOUTH EVERY DAY AS NEEDED (Patient not taking: Reported on 02/02/2018) 30 tablet 0 Not Taking at Unknown time  . lisinopril (PRINIVIL,ZESTRIL) 10 MG tablet Take 1 tablet (10 mg total) by mouth daily. (Patient not taking: Reported on 02/02/2018) 30 tablet 6 Not Taking at Unknown time  . ondansetron (ZOFRAN ODT) 4 MG disintegrating tablet Take 1 tablet (4 mg total) by mouth every 6 (six) hours as needed for nausea or vomiting. (Patient not taking: Reported on 02/02/2018) 20 tablet 0 Not Taking at Unknown time  . ondansetron (ZOFRAN) 4 MG tablet Take 1 tablet (4  mg total) by mouth daily as needed for nausea or vomiting. (Patient not taking: Reported on 03/09/2017) 14 tablet 0 Not Taking at Unknown time  . oxyCODONE (OXY IR/ROXICODONE) 5 MG immediate release tablet Take 1 tablet (5 mg total) by mouth every 6 (six) hours as needed for severe pain. (Patient not taking: Reported on 02/02/2018) 10 tablet 0 Not Taking at Unknown time  . tamsulosin (FLOMAX) 0.4 MG CAPS capsule Take 1  capsule (0.4 mg total) by mouth daily. (Patient not taking: Reported on 02/02/2018) 7 capsule 0 Not Taking at Unknown time  . traMADol (ULTRAM) 50 MG tablet Take 1 tablet (50 mg total) by mouth every 6 (six) hours as needed. (Patient not taking: Reported on 03/09/2017) 20 tablet 0 Not Taking at Unknown time   Social History   Socioeconomic History  . Marital status: Divorced    Spouse name: Not on file  . Number of children: Not on file  . Years of education: Not on file  . Highest education level: Not on file  Occupational History  . Not on file  Social Needs  . Financial resource strain: Not on file  . Food insecurity:    Worry: Not on file    Inability: Not on file  . Transportation needs:    Medical: Not on file    Non-medical: Not on file  Tobacco Use  . Smoking status: Current Every Day Smoker    Packs/day: 1.00    Types: Cigarettes  . Smokeless tobacco: Never Used  Substance and Sexual Activity  . Alcohol use: Yes    Comment: weekends  . Drug use: Yes    Types: Marijuana    Comment: within the week  . Sexual activity: Not on file  Lifestyle  . Physical activity:    Days per week: Not on file    Minutes per session: Not on file  . Stress: Not on file  Relationships  . Social connections:    Talks on phone: Not on file    Gets together: Not on file    Attends religious service: Not on file    Active member of club or organization: Not on file    Attends meetings of clubs or organizations: Not on file    Relationship status: Not on file  . Intimate partner violence:    Fear of current or ex partner: Not on file    Emotionally abused: Not on file    Physically abused: Not on file    Forced sexual activity: Not on file  Other Topics Concern  . Not on file  Social History Narrative  . Not on file    Family History  Problem Relation Age of Onset  . CAD Paternal Grandfather       Review of systems complete and found to be negative unless listed above       PHYSICAL EXAM  General: Well developed, well nourished, in no acute distress HEENT:  Normocephalic and atramatic Neck:  No JVD.  Lungs: Clear bilaterally to auscultation and percussion. Heart: HRRR . Normal S1 and S2 without gallops or murmurs.  Abdomen: Bowel sounds are positive, abdomen soft and non-tender  Msk:  Back normal, normal gait. Normal strength and tone for age. Extremities: No clubbing, cyanosis or edema.   Neuro: Alert and oriented X 3. Psych:  Good affect, responds appropriately  Labs:   Lab Results  Component Value Date   WBC 9.6 02/01/2018   HGB 19.0 (H) 02/01/2018   HCT  55.5 (H) 02/01/2018   MCV 115.1 (H) 02/01/2018   PLT 143 (L) 02/01/2018    Recent Labs  Lab 02/01/18 2231  NA 141  K 3.0*  CL 103  CO2 26  BUN 9  CREATININE 0.97  CALCIUM 9.2  GLUCOSE 146*   Lab Results  Component Value Date   CKTOTAL 62 11/20/2011   CKMB < 0.5 (L) 11/20/2011   TROPONINI 0.05 (HH) 02/02/2018    Lab Results  Component Value Date   CHOL 144 06/19/2016   Lab Results  Component Value Date   HDL 28 (L) 06/19/2016   Lab Results  Component Value Date   LDLCALC 82 06/19/2016   Lab Results  Component Value Date   TRIG 171 (H) 06/19/2016   Lab Results  Component Value Date   CHOLHDL 5.1 06/19/2016   No results found for: LDLDIRECT    Radiology: Dg Chest 2 View  Result Date: 02/01/2018 CLINICAL DATA:  Chest pain, numbness EXAM: CHEST - 2 VIEW COMPARISON:  06/17/2016 FINDINGS: Lungs are clear.  No pleural effusion or pneumothorax. The heart is normal in size.  Coronary stents. Mild degenerative changes of the visualized thoracolumbar spine. IMPRESSION: No evidence of acute cardiopulmonary disease. Electronically Signed   By: Charline Bills M.D.   On: 02/01/2018 22:56    EKG: Normal sinus rhythm, old anteroseptal MI  ASSESSMENT AND PLAN:   1.  Chest pain, with typical and atypical features, nondiagnostic ECG, borderline elevated troponin,  without peak or trough, without current chest pain 2.  Known CAD, status post multiple coronary stents, history of anterior STEMI, with DES LAD, occluded mid left circumflex, and residual 75% stenosis distal RCA 3.  Ischemic cardiomyopathy, with LVEF 25 to 35%, with history of apical thrombus, on Eliquis  Recommendations  1.  Agree with current therapy 2.  Hold Eliquis 3.  Lexiscan Myoview today 4.  2D echocardiogram  Signed: Marcina Millard MD,PhD, [facility] 02/02/2018, 8:20 AM

## 2018-02-02 NOTE — Plan of Care (Signed)
  Problem: Education: Goal: Knowledge of General Education information will improve Description Including pain rating scale, medication(s)/side effects and non-pharmacologic comfort measures Outcome: Progressing   Problem: Pain Managment: Goal: General experience of comfort will improve Outcome: Progressing   Problem: Education: Goal: Understanding of cardiac disease, CV risk reduction, and recovery process will improve Outcome: Progressing Goal: Individualized Educational Video(s) Outcome: Progressing   Problem: Cardiac: Goal: Ability to achieve and maintain adequate cardiovascular perfusion will improve Outcome: Progressing

## 2018-02-02 NOTE — ED Notes (Signed)
Pt given a warm blanket 

## 2018-02-02 NOTE — Plan of Care (Signed)
Pain free.  Denies need for pain med for chest pain.

## 2018-02-02 NOTE — Progress Notes (Signed)
*  PRELIMINARY RESULTS* Echocardiogram 2D Echocardiogram has been performed.  Cristela Blue 02/02/2018, 1:37 PM

## 2018-02-02 NOTE — ED Notes (Signed)
Dr. Sheryle Hail in with pt at this time.

## 2018-02-02 NOTE — ED Notes (Signed)
Pt is resting comfortably in bed. respirations even/unlabored and unlabored. nadn

## 2018-02-03 LAB — ECHOCARDIOGRAM COMPLETE
HEIGHTINCHES: 67 in
WEIGHTICAEL: 2942.4 [oz_av]

## 2018-02-03 LAB — LIPID PANEL
CHOL/HDL RATIO: 4.4 ratio
Cholesterol: 146 mg/dL (ref 0–200)
HDL: 33 mg/dL — ABNORMAL LOW (ref >40)
LDL Cholesterol: 71 mg/dL (ref 0–99)
Triglycerides: 212 mg/dL — ABNORMAL HIGH (ref <150)
VLDL: 42 mg/dL — ABNORMAL HIGH (ref 0–40)

## 2018-02-03 MED ORDER — ASPIRIN 81 MG PO TBEC: 81.0000 mg | DELAYED_RELEASE_TABLET | Freq: Every day | ORAL | 1 refills | Status: DC

## 2018-02-03 MED ORDER — CLOPIDOGREL BISULFATE 75 MG PO TABS: 75.0000 mg | ORAL_TABLET | Freq: Every day | ORAL | 1 refills | Status: DC

## 2018-02-03 MED ORDER — ISOSORBIDE MONONITRATE ER 60 MG PO TB24
60.0000 mg | ORAL_TABLET | Freq: Every day | ORAL | Status: DC
  Administered 2018-02-03: 60 mg via ORAL
  Filled 2018-02-03: qty 1

## 2018-02-03 MED ORDER — ISOSORBIDE MONONITRATE ER 60 MG PO TB24: 60.0000 mg | ORAL_TABLET | Freq: Every day | ORAL | 1 refills | Status: DC

## 2018-02-03 MED ORDER — CLOPIDOGREL BISULFATE 75 MG PO TABS
75.0000 mg | ORAL_TABLET | Freq: Every day | ORAL | Status: DC
  Administered 2018-02-03: 75 mg via ORAL
  Filled 2018-02-03: qty 1

## 2018-02-03 NOTE — Discharge Summary (Signed)
[employer] - [place] at [facility]   PATIENT NAME: Cameron [name]    MR#:  [id number]  DATE OF BIRTH:  03/14/1961  DATE OF ADMISSION:  02/01/2018   ADMITTING PHYSICIAN: Arnaldo Natal, MD  DATE OF DISCHARGE: 02/03/2018  PRIMARY CARE PHYSICIAN: Patient, No Pcp Per   ADMISSION DIAGNOSIS:  NSTEMI (non-ST elevated myocardial infarction) (HCC) [I21.4] DISCHARGE DIAGNOSIS:  Active Problems:   Chest pain  SECONDARY DIAGNOSIS:   Past Medical History:  Diagnosis Date  . Coronary artery disease    a. 12/17 PCI with DES to pLAD with resdiual disease (RX therapy)  . Hyperlipidemia   . Hypertension   . STEMI (ST elevation myocardial infarction) (HCC) 2017   HOSPITAL COURSE:  1. Chest pain: Concerning for anginal pain given history of ACS. apical and inferior lateral scar without significant ischemia on Lexiscan Myoview, DC Eliquis, Add isosorbide mononitrate 60 mg daily per Dr. Darrold Junker. Continue therapy with aspirin, Coreg and Plavix.  2. Coronary artery disease: Continue aspirin and plavix. Nitroglycerin as needed for pain. 3. Hypertension: continue lisinopril and carvedilol. Hydralazine as needed pressure improved 4. Hyperlipidemia: Continue statin therapy 5. DVT prophylaxis: SCDs 6.  Nicotine abuse smoking cessation 4 minutes, nicotine patch. .  Hypokalemia.  Given potassium supplement. Discharge to home today per Dr. Darrold Junker. DISCHARGE CONDITIONS:  Stable, discharged home today. CONSULTS OBTAINED:  Treatment Team:  Lamar Blinks, MD Marcina Millard, MD [name], MD DRUG ALLERGIES:  No Known Allergies DISCHARGE MEDICATIONS:   Allergies as of 02/03/2018   No Known Allergies     Medication List    STOP taking these medications   ELIQUIS 5 MG Tabs tablet Generic drug:  apixaban   oxyCODONE 5 MG immediate release tablet Commonly known as:  Oxy IR/ROXICODONE   traMADol 50 MG tablet Commonly known as:  ULTRAM     TAKE  these medications   aspirin 81 MG EC tablet Take 1 tablet (81 mg total) by mouth daily. Start taking on:  02/04/2018   atorvastatin 80 MG tablet Commonly known as:  LIPITOR Take 1 tablet (80 mg total) by mouth daily at 6 PM.   carvedilol 6.25 MG tablet Commonly known as:  COREG Take 1 tablet (6.25 mg total) by mouth 2 (two) times daily with a meal.   clopidogrel 75 MG tablet Commonly known as:  PLAVIX Take 1 tablet (75 mg total) by mouth daily. Start taking on:  02/04/2018   furosemide 20 MG tablet Commonly known as:  LASIX TAKE 1 TABLET BY MOUTH EVERY DAY AS NEEDED   isosorbide mononitrate 60 MG 24 hr tablet Commonly known as:  IMDUR Take 1 tablet (60 mg total) by mouth daily. Start taking on:  02/04/2018   lisinopril 20 MG tablet Commonly known as:  PRINIVIL,ZESTRIL Take 20 mg by mouth daily. What changed:  Another medication with the same name was removed. Continue taking this medication, and follow the directions you see here.   nitroGLYCERIN 0.4 MG SL tablet Commonly known as:  NITROSTAT Place 1 tablet (0.4 mg total) under the tongue every 5 (five) minutes as needed.   ondansetron 4 MG disintegrating tablet Commonly known as:  ZOFRAN ODT Take 1 tablet (4 mg total) by mouth every 6 (six) hours as needed for nausea or vomiting.   ondansetron 4 MG tablet Commonly known as:  ZOFRAN Take 1 tablet (4 mg total) by mouth daily as needed for nausea or vomiting.   tamsulosin 0.4 MG Caps capsule Commonly known  as:  FLOMAX Take 1 capsule (0.4 mg total) by mouth daily.        DISCHARGE INSTRUCTIONS:  See AVS. If you experience worsening of your admission symptoms, develop shortness of breath, life threatening emergency, suicidal or homicidal thoughts you must seek medical attention immediately by calling 911 or calling your MD immediately  if symptoms less severe.  You Must read complete instructions/literature along with all the possible adverse reactions/side effects for  all the Medicines you take and that have been prescribed to you. Take any new Medicines after you have completely understood and accpet all the possible adverse reactions/side effects.   Please note  You were cared for by a hospitalist during your hospital stay. If you have any questions about your discharge medications or the care you received while you were in the hospital after you are discharged, you can call the unit and asked to speak with the hospitalist on call if the hospitalist that took care of you is not available. Once you are discharged, your primary care physician will handle any further medical issues. Please note that NO REFILLS for any discharge medications will be authorized once you are discharged, as it is imperative that you return to your primary care physician (or establish a relationship with a primary care physician if you do not have one) for your aftercare needs so that they can reassess your need for medications and monitor your lab values.    On the day of Discharge:  VITAL SIGNS:  Blood pressure (!) 125/95, pulse 64, temperature 97.8 F (36.6 C), temperature source Oral, resp. rate 18, height 5\' 7"  (1.702 m), weight 183 lb 14.4 oz (83.4 kg), SpO2 95 %. PHYSICAL EXAMINATION:  GENERAL:  57 y.o.-year-old patient lying in the bed with no acute distress.  EYES: Pupils equal, round, reactive to light and accommodation. No scleral icterus. Extraocular muscles intact.  HEENT: Head atraumatic, normocephalic. Oropharynx and nasopharynx clear.  NECK:  Supple, no jugular venous distention. No thyroid enlargement, no tenderness.  LUNGS: Normal breath sounds bilaterally, no wheezing, rales,rhonchi or crepitation. No use of accessory muscles of respiration.  CARDIOVASCULAR: S1, S2 normal. No murmurs, rubs, or gallops.  ABDOMEN: Soft, non-tender, non-distended. Bowel sounds present. No organomegaly or mass.  EXTREMITIES: No pedal edema, cyanosis, or clubbing.  NEUROLOGIC: Cranial  nerves II through XII are intact. Muscle strength 5/5 in all extremities. Sensation intact. Gait not checked.  PSYCHIATRIC: The patient is alert and oriented x 3.  SKIN: No obvious rash, lesion, or ulcer.  DATA REVIEW:   CBC Recent Labs  Lab 02/01/18 2231  WBC 9.6  HGB 19.0*  HCT 55.5*  PLT 143*    Chemistries  Recent Labs  Lab 02/02/18 1232  NA 141  K 3.4*  CL 106  CO2 26  GLUCOSE 113*  BUN 10  CREATININE 0.82  CALCIUM 9.2  MG 1.7     Microbiology Results  Results for orders placed or performed during the hospital encounter of 03/09/17  Urine Culture     Status: None   Collection Time: 03/09/17 11:32 AM  Result Value Ref Range Status   Specimen Description URINE, RANDOM  Final   Special Requests Normal  Final   Culture   Final    NO GROWTH Performed at [facility], [address], [place], [place] [address]    Report Status 03/11/2017 FINAL  Final    RADIOLOGY:  No results found.   Management plans discussed with the patient, family  and they are in agreement.  CODE STATUS: Full Code   TOTAL TIME TAKING CARE OF THIS PATIENT: 28 minutes.    [name] M.D on 02/03/2018 at 4:02 PM  Between 7am to 6pm - Pager - [phone]  After 6pm go to [email or web address] - Social research officer, government  [employer] [facility]  Office  [phone]  CC: Primary care physician; Patient, No Pcp Per   Note: This dictation was prepared with Dragon dictation along with smaller phrase technology. Any transcriptional errors that result from this process are unintentional.

## 2018-02-03 NOTE — Progress Notes (Signed)
[employer] - [place] at [facility]        Cameron [name] was admitted to the Hospital on 02/01/2018 and Discharged  02/03/2018 and should be excused from work/school   for 5  days starting 02/01/2018 , may return to work/school without any restrictions.  [name] M.D on 02/03/2018,at 4:46 PM  [employer] - [place] at [facility]  [phone]

## 2018-02-03 NOTE — Progress Notes (Signed)
[facility] Cardiology  SUBJECTIVE: Patient laying in bed, denies chest pain   Vitals:   02/02/18 1644 02/02/18 1921 02/03/18 0322 02/03/18 0827  BP: 122/86 (!) 143/92 134/89 (!) 125/95  Pulse: 60 69 (!) 59 64  Resp:  18 18   Temp: 97.8 F (36.6 C) 97.8 F (36.6 C) 98.1 F (36.7 C) 97.8 F (36.6 C)  TempSrc: Oral Oral  Oral  SpO2: 94% 96% 96% 95%  Weight:   83.4 kg (183 lb 14.4 oz)   Height:         Intake/Output Summary (Last 24 hours) at 02/03/2018 4536 Last data filed at 02/03/2018 0531 Gross per 24 hour  Intake 360 ml  Output 100 ml  Net 260 ml      PHYSICAL EXAM  General: Well developed, well nourished, in no acute distress HEENT:  Normocephalic and atramatic Neck:  No JVD.  Lungs: Clear bilaterally to auscultation and percussion. Heart: HRRR . Normal S1 and S2 without gallops or murmurs.  Abdomen: Bowel sounds are positive, abdomen soft and non-tender  Msk:  Back normal, normal gait. Normal strength and tone for age. Extremities: No clubbing, cyanosis or edema.   Neuro: Alert and oriented X 3. Psych:  Good affect, responds appropriately   LABS: Basic Metabolic Panel: Recent Labs    02/01/18 2231 02/02/18 1232  NA 141 141  K 3.0* 3.4*  CL 103 106  CO2 26 26  GLUCOSE 146* 113*  BUN 9 10  CREATININE 0.97 0.82  CALCIUM 9.2 9.2  MG  --  1.7   Liver Function Tests: No results for input(s): AST, ALT, ALKPHOS, BILITOT, PROT, ALBUMIN in the last 72 hours. No results for input(s): LIPASE, AMYLASE in the last 72 hours. CBC: Recent Labs    02/01/18 2231  WBC 9.6  HGB 19.0*  HCT 55.5*  MCV 115.1*  PLT 143*   Cardiac Enzymes: Recent Labs    02/02/18 0338 02/02/18 1232 02/02/18 1631  TROPONINI 0.05* 0.04* 0.04*   BNP: Invalid input(s): POCBNP D-Dimer: No results for input(s): DDIMER in the last 72 hours. Hemoglobin A1C: Recent Labs    02/01/18 2231  HGBA1C 5.1   Fasting Lipid Panel: Recent Labs    02/03/18 0314  CHOL 146  HDL 33*  LDLCALC  71  TRIG 212*  CHOLHDL 4.4   Thyroid Function Tests: Recent Labs    02/02/18 0338  TSH 1.973   Anemia Panel: No results for input(s): VITAMINB12, FOLATE, FERRITIN, TIBC, IRON, RETICCTPCT in the last 72 hours.  Dg Chest 2 View  Result Date: 02/01/2018 CLINICAL DATA:  Chest pain, numbness EXAM: CHEST - 2 VIEW COMPARISON:  06/17/2016 FINDINGS: Lungs are clear.  No pleural effusion or pneumothorax. The heart is normal in size.  Coronary stents. Mild degenerative changes of the visualized thoracolumbar spine. IMPRESSION: No evidence of acute cardiopulmonary disease. Electronically Signed   By: Charline Bills M.D.   On: 02/01/2018 22:56   Nm Myocar Multi W/spect W/wall Motion / Ef  Result Date: 02/02/2018  Blood pressure demonstrated a normal response to exercise.  There was no ST segment deviation noted during stress.  Defect 1: There is a medium defect of moderate severity present in the mid anterior, mid inferolateral, apical anterior and apex location.  Findings consistent with prior myocardial infarction.  This is an intermediate risk study.  The left ventricular ejection fraction is severely decreased (<30%).      Echo LVEF 20 to 25%, no evidence of apical thrombus  TELEMETRY:  Sinus rhythm:  ASSESSMENT AND PLAN:  Active Problems:   Chest pain    1.  Intermittent chest pain, borderline elevated troponin without peak or trough, apical and inferior lateral scar without significant ischemia on Lexiscan Myoview 2.  Ischemic cardiomyopathy, LVEF 20 to 25%, no evidence of apical thrombus  Recommendations  1.  Agree with current therapy 2.  DC Eliquis 3.  Add isosorbide mononitrate 60 mg daily 4.  If patient ambulates well today, may consider discharge in a.m., follow-up with Dr. [name] in 1 to 2 weeks   Marcina Millard, MD, PhD, [facility] 02/03/2018 9:28 AM

## 2018-02-03 NOTE — Progress Notes (Signed)
Nutrition Brief Note  Patient identified on the Malnutrition Screening Tool (MST) Report  57 year old male with h/o heart disease admitted for chest pain  Wt Readings from Last 15 Encounters:  02/03/18 183 lb 14.4 oz (83.4 kg)  03/09/17 200 lb (90.7 kg)  03/05/17 200 lb (90.7 kg)  02/11/17 200 lb (90.7 kg)  06/20/16 208 lb 14.4 oz (94.8 kg)  06/17/16 205 lb (93 kg)    Body mass index is 28.8 kg/m. Patient meets criteria for overweight based on current BMI. Per chart, pt with 19lb(10%) wt loss in 9 months; this is not significant given the time frame.    Current diet order is HH, patient is consuming approximately 80% of meals at this time. Labs and medications reviewed.   No nutrition interventions warranted at this time. If nutrition issues arise, please consult RD.   Betsey Holiday MS, RD, LDN Pager #- [phone] Office#- [phone] After Hours Pager: [phone]

## 2018-02-03 NOTE — Progress Notes (Signed)
MD AND cardiology PA was made aware of pt's having 10 beat of v-tach , see new order , will continue to monitor

## 2018-02-03 NOTE — Progress Notes (Signed)
[employer] - [place] at [facility]                                                                                                                                                                                  Patient Demographics   Cameron [name], is a 57 y.o. male, DOB - [birth year], ZOX:[id number]  Admit date - 02/01/2018   Admitting Physician Arnaldo Natal, MD  Outpatient Primary MD for the patient is Patient, No Pcp Per   LOS - 0  Subjective: Patient states that he has mild chest pain.     Review of Systems:   CONSTITUTIONAL: No documented fever. No fatigue, weakness. No weight gain, no weight loss.  EYES: No blurry or double vision.  ENT: No tinnitus. No postnasal drip. No redness of the oropharynx.  RESPIRATORY: No cough, no wheeze, no hemoptysis. No dyspnea.  CARDIOVASCULAR: Positive chest pain. No orthopnea. No palpitations. No syncope.  GASTROINTESTINAL: No nausea, no vomiting or diarrhea. No abdominal pain. No melena or hematochezia.  GENITOURINARY: No dysuria or hematuria.  ENDOCRINE: No polyuria or nocturia. No heat or cold intolerance.  HEMATOLOGY: No anemia. No bruising. No bleeding.  INTEGUMENTARY: No rashes. No lesions.  MUSCULOSKELETAL: No arthritis. No swelling. No gout.  NEUROLOGIC: No numbness, tingling, or ataxia. No seizure-type activity.  PSYCHIATRIC: No anxiety. No insomnia. No ADD.    Vitals:   Vitals:   02/02/18 1644 02/02/18 1921 02/03/18 0322 02/03/18 0827  BP: 122/86 (!) 143/92 134/89 (!) 125/95  Pulse: 60 69 (!) 59 64  Resp:  18 18   Temp: 97.8 F (36.6 C) 97.8 F (36.6 C) 98.1 F (36.7 C) 97.8 F (36.6 C)  TempSrc: Oral Oral  Oral  SpO2: 94% 96% 96% 95%  Weight:   183 lb 14.4 oz (83.4 kg)   Height:        Wt Readings from Last 3 Encounters:  02/03/18 183 lb 14.4 oz (83.4 kg)  03/09/17 200 lb (90.7 kg)  03/05/17 200 lb (90.7 kg)     Intake/Output Summary (Last 24 hours) at 02/03/2018 1528 Last data filed at  02/03/2018 0531 Gross per 24 hour  Intake 360 ml  Output 100 ml  Net 260 ml    Physical Exam:   GENERAL: Pleasant-appearing in no apparent distress.  HEAD, EYES, EARS, NOSE AND THROAT: Atraumatic, normocephalic. Extraocular muscles are intact. Pupils equal and reactive to light. Sclerae anicteric. No conjunctival injection. No oro-pharyngeal erythema.  NECK: Supple. There is no jugular venous distention. No bruits, no lymphadenopathy, no thyromegaly.  HEART: Regular rate and rhythm,. No murmurs, no rubs, no clicks.  LUNGS: Clear to auscultation bilaterally. No rales or rhonchi.  No wheezes.  ABDOMEN: Soft, flat, nontender, nondistended. Has good bowel sounds. No hepatosplenomegaly appreciated.  EXTREMITIES: No evidence of any cyanosis, clubbing, or peripheral edema.  +2 pedal and radial pulses bilaterally.  NEUROLOGIC: The patient is alert, awake, and oriented x3 with no focal motor or sensory deficits appreciated bilaterally.  SKIN: Moist and warm with no rashes appreciated.  Psych: Not anxious, depressed LN: No inguinal LN enlargement    Antibiotics   Anti-infectives (From admission, onward)   None      Medications   Scheduled Meds: . aspirin EC  81 mg Oral Daily  . atorvastatin  80 mg Oral q1800  . carvedilol  6.25 mg Oral BID WC  . docusate sodium  100 mg Oral BID  . isosorbide mononitrate  60 mg Oral Daily  . lisinopril  20 mg Oral Daily  . nicotine  21 mg Transdermal Daily  . sodium chloride flush  3 mL Intravenous Q12H   Continuous Infusions: PRN Meds:.acetaminophen **OR** acetaminophen, hydrALAZINE, nitroGLYCERIN, ondansetron **OR** ondansetron (ZOFRAN) IV   Data Review:   Micro Results No results found for this or any previous visit (from the past 240 hour(s)).  Radiology Reports Dg Chest 2 View  Result Date: 02/01/2018 CLINICAL DATA:  Chest pain, numbness EXAM: CHEST - 2 VIEW COMPARISON:  06/17/2016 FINDINGS: Lungs are clear.  No pleural effusion or  pneumothorax. The heart is normal in size.  Coronary stents. Mild degenerative changes of the visualized thoracolumbar spine. IMPRESSION: No evidence of acute cardiopulmonary disease. Electronically Signed   By: Charline Bills M.D.   On: 02/01/2018 22:56   Nm Myocar Multi W/spect W/wall Motion / Ef  Result Date: 02/02/2018  Blood pressure demonstrated a normal response to exercise.  There was no ST segment deviation noted during stress.  Defect 1: There is a medium defect of moderate severity present in the mid anterior, mid inferolateral, apical anterior and apex location.  Findings consistent with prior myocardial infarction.  This is an intermediate risk study.  The left ventricular ejection fraction is severely decreased (<30%).      CBC Recent Labs  Lab 02/01/18 2231  WBC 9.6  HGB 19.0*  HCT 55.5*  PLT 143*  MCV 115.1*  MCH 39.3*  MCHC 34.1  RDW 14.7*    Chemistries  Recent Labs  Lab 02/01/18 2231 02/02/18 1232  NA 141 141  K 3.0* 3.4*  CL 103 106  CO2 26 26  GLUCOSE 146* 113*  BUN 9 10  CREATININE 0.97 0.82  CALCIUM 9.2 9.2  MG  --  1.7   ------------------------------------------------------------------------------------------------------------------ estimated creatinine clearance is 103.9 mL/min (by C-G formula based on SCr of 0.82 mg/dL). ------------------------------------------------------------------------------------------------------------------ Recent Labs    02/01/18 2231  HGBA1C 5.1   ------------------------------------------------------------------------------------------------------------------ Recent Labs    02/03/18 0314  CHOL 146  HDL 33*  LDLCALC 71  TRIG 254*  CHOLHDL 4.4   ------------------------------------------------------------------------------------------------------------------ Recent Labs    02/02/18 0338  TSH 1.973    ------------------------------------------------------------------------------------------------------------------ No results for input(s): VITAMINB12, FOLATE, FERRITIN, TIBC, IRON, RETICCTPCT in the last 72 hours.  Coagulation profile No results for input(s): INR, PROTIME in the last 168 hours.  No results for input(s): DDIMER in the last 72 hours.  Cardiac Enzymes Recent Labs  Lab 02/02/18 0338 02/02/18 1232 02/02/18 1631  TROPONINI 0.05* 0.04* 0.04*   ------------------------------------------------------------------------------------------------------------------ Invalid input(s): POCBNP    Assessment & Plan   1.  Chest pain: Concerning for anginal pain given history of ACS. apical and inferior lateral  scar without significant ischemia on Lexiscan Myoview,  DC Eliquis, Add isosorbide mononitrate 60 mg daily per Dr. Darrold Junker. Continue therapy with aspirin, Coreg Plavix.  2.  Coronary artery disease: Continue aspirin and plavix.  Nitroglycerin as needed for pain. 3.  Hypertension: continue lisinopril and carvedilol.  Hydralazine as needed pressure improved 4.  Hyperlipidemia: Continue statin therapy 5.  DVT prophylaxis: SCDs 6.  Nicotine abuse smoking cessation 4 minutes, nicotine patch. .  Hypokalemia.  Given potassium supplement.    Code Status Orders  (From admission, onward)        Start     Ordered   02/02/18 0415  Full code  Continuous     02/02/18 0414    Code Status History    Date Active Date Inactive Code Status Order ID Comments User Context   06/17/2016 0630 06/20/2016 1840 Full Code [id number]  Runell Gess, MD Inpatient           Consultscardiogly  DVT Prophylaxis SCDs  Lab Results  Component Value Date   PLT 143 (L) 02/01/2018     Time Spent in minutes   28 minutes greater than 50% of time spent in care coordination and counseling patient regarding the condition and plan of care.   [name] M.D on 02/03/2018 at 3:28  PM  Between 7am to 6pm - Pager - [phone]  After 6pm go to [email or web address] - Social research officer, government  [employer]   Office  [phone]

## 2018-02-03 NOTE — Progress Notes (Signed)
Patient is discharge home via wheel chair, in a stable condition summary and f/u care given , verbalized understanding

## 2018-02-03 NOTE — Plan of Care (Signed)
  Problem: Education: Goal: Knowledge of General Education information will improve Description Including pain rating scale, medication(s)/side effects and non-pharmacologic comfort measures Outcome: Progressing   Problem: Pain Managment: Goal: General experience of comfort will improve Outcome: Progressing   Problem: Education: Goal: Understanding of cardiac disease, CV risk reduction, and recovery process will improve Outcome: Progressing Goal: Individualized Educational Video(s) Outcome: Progressing   Problem: Cardiac: Goal: Ability to achieve and maintain adequate cardiovascular perfusion will improve Outcome: Progressing   

## 2018-04-29 ENCOUNTER — Other Ambulatory Visit: Payer: Self-pay

## 2018-04-29 ENCOUNTER — Encounter: Payer: Self-pay | Admitting: Emergency Medicine

## 2018-04-29 ENCOUNTER — Ambulatory Visit
Admission: EM | Admit: 2018-04-29 | Discharge: 2018-04-29 | Disposition: A | Discharge status: Home / Self Care | Payer: Commercial Managed Care - PPO | Attending: Family Medicine | Admitting: Family Medicine

## 2018-04-29 DIAGNOSIS — R197 Diarrhea, unspecified: Secondary | ICD-10-CM

## 2018-04-29 DIAGNOSIS — R531 Weakness: Secondary | ICD-10-CM | POA: Diagnosis not present

## 2018-04-29 LAB — COMPREHENSIVE METABOLIC PANEL
ALBUMIN: 3.9 g/dL (ref 3.5–5.0)
ALK PHOS: 82 U/L (ref 38–126)
ALT: 8 U/L (ref 0–44)
ANION GAP: 11 (ref 5–15)
AST: 22 U/L (ref 15–41)
BUN: 15 mg/dL (ref 6–20)
CO2: 30 mmol/L (ref 22–32)
Calcium: 8.8 mg/dL — ABNORMAL LOW (ref 8.9–10.3)
Chloride: 96 mmol/L — ABNORMAL LOW (ref 98–111)
Creatinine, Ser: 1.13 mg/dL (ref 0.61–1.24)
GFR calc non Af Amer: >60 mL/min (ref >60)
GLUCOSE: 127 mg/dL — AB (ref 70–99)
POTASSIUM: 3.5 mmol/L (ref 3.5–5.1)
SODIUM: 137 mmol/L (ref 135–145)
TOTAL PROTEIN: 7.3 g/dL (ref 6.5–8.1)
Total Bilirubin: 2.3 mg/dL — ABNORMAL HIGH (ref 0.3–1.2)

## 2018-04-29 MED ORDER — DIPHENOXYLATE-ATROPINE 2.5-0.025 MG PO TABS: 1.0000 | ORAL_TABLET | Freq: Four times a day (QID) | ORAL | 0 refills | Status: DC | PRN

## 2018-04-29 NOTE — Discharge Instructions (Signed)
Labs stable.  Medication as needed.  Take care  Dr. Adriana Simas

## 2018-04-29 NOTE — ED Provider Notes (Signed)
[facility]    CSN: [id number] Arrival date & time: 04/29/18  1005  History   Chief Complaint Chief Complaint  Patient presents with  . Diarrhea  . Weakness   HPI  57 year old male presents with diarrhea.  2-day history of diarrhea.  Reports loose/watery stool.  No associated abdominal pain.  No fever.  No chills.  No bloody stool.  No reports of recent sick contacts.  No new foods.  No recent antibiotic use or travel.  Reports fatigue and weakness.  No medications or interventions tried.  No other associated symptoms.  No other complaints.  PMH, Surgical Hx, Family Hx, Social History reviewed and updated as below.  Past Medical History:  Diagnosis Date  . Coronary artery disease    a. 12/17 PCI with DES to pLAD with resdiual disease (RX therapy)  . Hyperlipidemia   . Hypertension   . STEMI (ST elevation myocardial infarction) [facility] 2017    Patient Active Problem List   Diagnosis Date Noted  . Chest pain 02/02/2018  . Essential hypertension 06/20/2016  . Hyperlipidemia 06/20/2016  . CAD (coronary artery disease), native coronary artery 06/20/2016  . STEMI involving oth coronary artery of anterior wall (HCC) 06/17/2016  . STEMI (ST elevation myocardial infarction) (HCC) 06/17/2016    Past Surgical History:  Procedure Laterality Date  . CARDIAC CATHETERIZATION N/A 06/17/2016   Procedure: Left Heart Cath and Coronary Angiography;  Surgeon: Runell Gess, MD;  Location: [facility] INVASIVE CV LAB;  Service: Cardiovascular;  Laterality: N/A;  . CARDIAC CATHETERIZATION N/A 06/17/2016   Procedure: Coronary Stent Intervention;  Surgeon: Runell Gess, MD;  Location: [facility] INVASIVE CV LAB;  Service: Cardiovascular;  Laterality: N/A;  . cardiac stents    . CERVICAL SPINE SURGERY         Home Medications    Prior to Admission medications   Medication Sig Start Date End Date Taking? Authorizing Provider  aspirin 81 MG EC tablet Take 1 tablet (81 mg total) by  mouth daily. 02/04/18  Yes [name], MD  carvedilol (COREG) 6.25 MG tablet Take 1 tablet (6.25 mg total) by mouth 2 (two) times daily with a meal. 06/20/16  Yes Laverda Page B, NP  clopidogrel (PLAVIX) 75 MG tablet Take 1 tablet (75 mg total) by mouth daily. 02/04/18  Yes [name], MD  furosemide (LASIX) 20 MG tablet TAKE 1 TABLET BY MOUTH EVERY DAY AS NEEDED 12/14/17  Yes Runell Gess, MD  isosorbide mononitrate (IMDUR) 60 MG 24 hr tablet Take 1 tablet (60 mg total) by mouth daily. 02/04/18  Yes [name], MD  lisinopril (PRINIVIL,ZESTRIL) 20 MG tablet Take 20 mg by mouth daily. 11/09/17  Yes [provider]  diphenoxylate-atropine (LOMOTIL) 2.5-0.025 MG tablet Take 1 tablet by mouth 4 (four) times daily as needed for diarrhea or loose stools. 04/29/18   Tommie Sams, DO  nitroGLYCERIN (NITROSTAT) 0.4 MG SL tablet Place 1 tablet (0.4 mg total) under the tongue every 5 (five) minutes as needed. 06/20/16   Arty Baumgartner, NP    Family History Family History  Problem Relation Age of Onset  . CAD Paternal Grandfather   . Breast cancer Mother   . Parkinson's disease Father   . Alzheimer's disease Father   . Hypertension Father   . Diabetes Father     Social History Social History   Tobacco Use  . Smoking status: Current Every Day Smoker    Packs/day: 1.00    Types: Cigarettes  .  Smokeless tobacco: Never Used  Substance Use Topics  . Alcohol use: Yes    Comment: weekends  . Drug use: Yes    Frequency: 1.0 times per week    Types: Marijuana     Allergies   Patient has no known allergies.   Review of Systems Review of Systems  Constitutional: Positive for fatigue.  Gastrointestinal: Positive for diarrhea.   Physical Exam Triage Vital Signs ED Triage Vitals [04/29/18 1024]  Enc Vitals Group     BP (!) 141/101     Pulse Rate 90     Resp 16     Temp 98.2 F (36.8 C)     Temp Source Oral     SpO2 96 %     Weight 185 lb (83.9 kg)     Height 5\' 7"   (1.702 m)     Head Circumference      Peak Flow      Pain Score 0     Pain Loc      Pain Edu?      Excl. in GC?    Updated Vital Signs BP (!) 141/107 (BP Location: Right Arm)   Pulse 90   Temp 98.2 F (36.8 C) (Oral)   Resp 16   Ht 5\' 7"  (1.702 m)   Wt 83.9 kg   SpO2 96%   BMI 28.98 kg/m   Visual Acuity Right Eye Distance:   Left Eye Distance:   Bilateral Distance:    Right Eye Near:   Left Eye Near:    Bilateral Near:     Physical Exam  Constitutional: He is oriented to person, place, and time. He appears well-developed. No distress.  HENT:  Head: Normocephalic and atraumatic.  Mouth/Throat: Oropharynx is clear and moist.  Cardiovascular: Normal rate and regular rhythm.  Pulmonary/Chest: Effort normal. No respiratory distress.  Abdominal: Soft. He exhibits no distension. There is no tenderness.  Neurological: He is alert and oriented to person, place, and time.  Psychiatric: He has a normal mood and affect. His behavior is normal.  Nursing note and vitals reviewed.   UC Treatments / Results  Labs (all labs ordered are listed, but only abnormal results are displayed) Labs Reviewed  COMPREHENSIVE METABOLIC PANEL - Abnormal; Notable for the following components:      Result Value   Chloride 96 (*)    Glucose, Bld 127 (*)    Calcium 8.8 (*)    Total Bilirubin 2.3 (*)    All other components within normal limits    EKG None  Radiology No results found.  Procedures Procedures (including critical care time)  Medications Ordered in UC Medications - No data to display  Initial Impression / Assessment and Plan / UC Course  I have reviewed the triage vital signs and the nursing notes.  Pertinent labs & imaging results that were available during my care of the patient were reviewed by me and considered in my medical decision making (see chart for details).     57 year old male presents with diarrhea.  Treating with Lomotil.  Supportive care.  Final  Clinical Impressions(s) / UC Diagnoses   Final diagnoses:  Diarrhea, unspecified type     Discharge Instructions     Labs stable.  Medication as needed.  Take care  Dr. Adriana Simas     ED Prescriptions    Medication Sig Dispense Auth. Provider   diphenoxylate-atropine (LOMOTIL) 2.5-0.025 MG tablet Take 1 tablet by mouth 4 (four) times daily as needed for diarrhea  or loose stools. 30 tablet Tommie Sams, DO     Controlled Substance Prescriptions [place] Controlled Substance Registry consulted? Not Applicable   Tommie Sams, DO 04/29/18 1149

## 2018-04-29 NOTE — ED Triage Notes (Addendum)
Patient in today c/o diarrhea x 2 days and weakness. Patient denies any recent antibiotic use or out of the country travel. Patient has felt feverish, but hasn't taken temperature.

## 2018-06-28 IMAGING — CT CT CTA ABD/PEL W/CM AND/OR W/O CM
3 of 9 series · 10 of 46 positions shown, 16 images · IV contrast (isovue)
Comparison: 02/12/2017

CLINICAL DATA: Acute lower abdominal pain and hematuria

EXAM:
CTA ABDOMEN AND PELVIS WITH CONTRAST
TECHNIQUE: Multidetector CT imaging of the abdomen and pelvis was performed
using the standard protocol during bolus administration of
intravenous contrast. Multiplanar reconstructed images and MIPs were
obtained and reviewed to evaluate the vascular anatomy.
CONTRAST:  100 cc Isovue 370

[Series 4: axial arterial · axial · arterial · 0.86mm/px · z∈[-1010,-960]mm · 2 of 250 slices shown]
[im 25/250  soft-tissue]
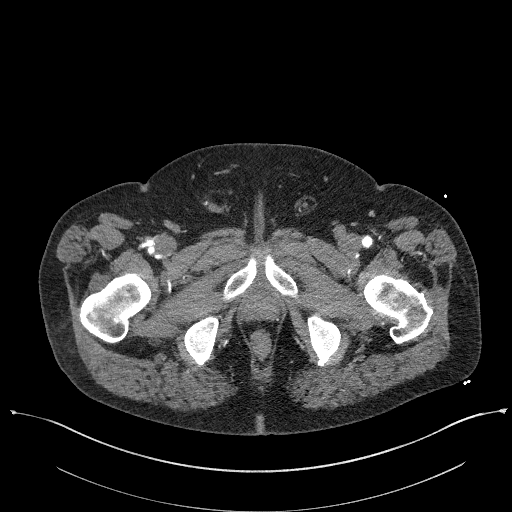
[im 50/250  soft-tissue]
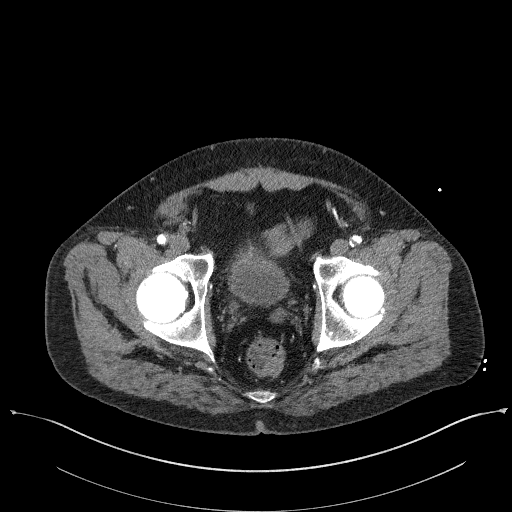

[Series 6: axial venous · axial · portal-venous · 0.85mm/px · z∈[-989,-634]mm · 6 of 101 slices shown, 11 images]
[im 15/101  soft-tissue]
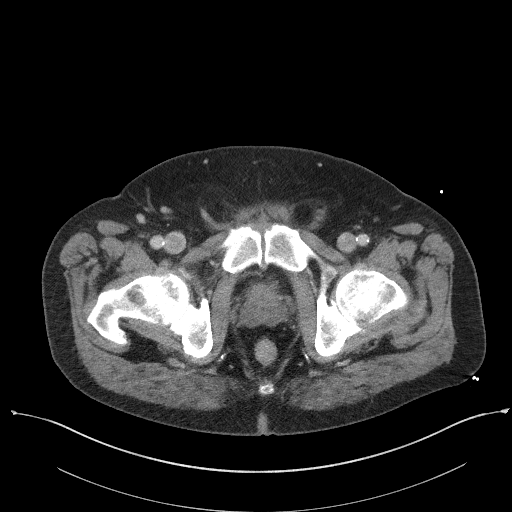
[im 15/101  bone]
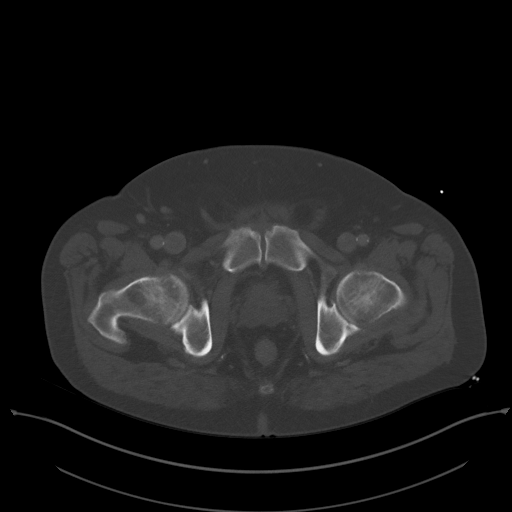
[im 29/101  soft-tissue]
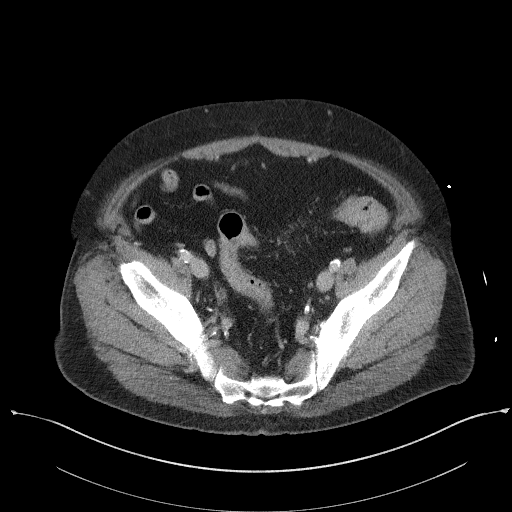
[im 43/101  soft-tissue]
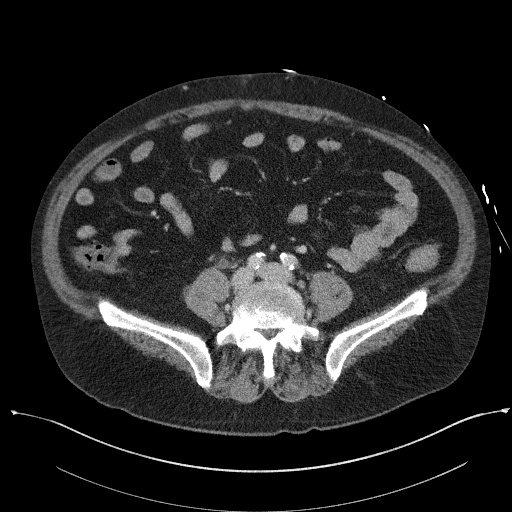
[im 43/101  lung]
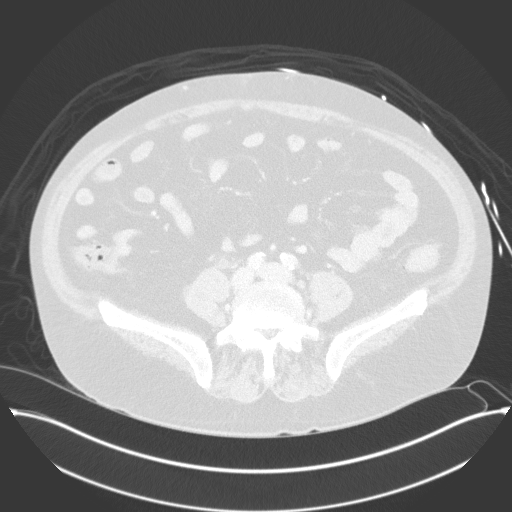
[im 58/101  soft-tissue]
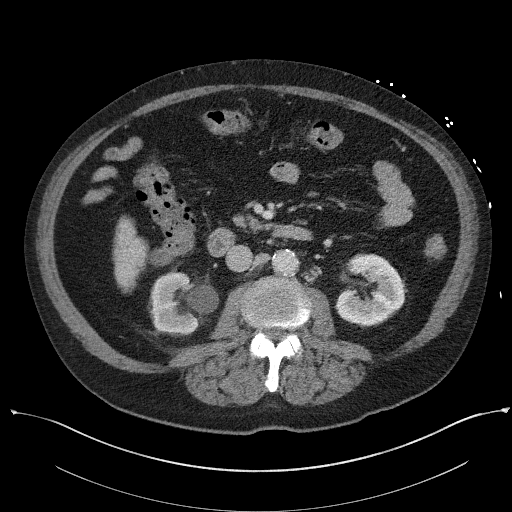
[im 58/101  lung]
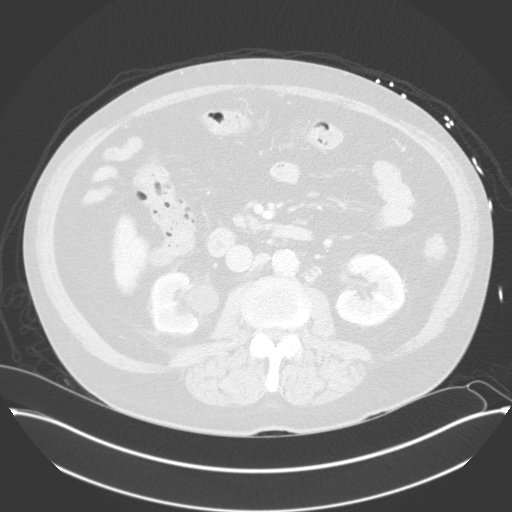
[im 72/101  soft-tissue]
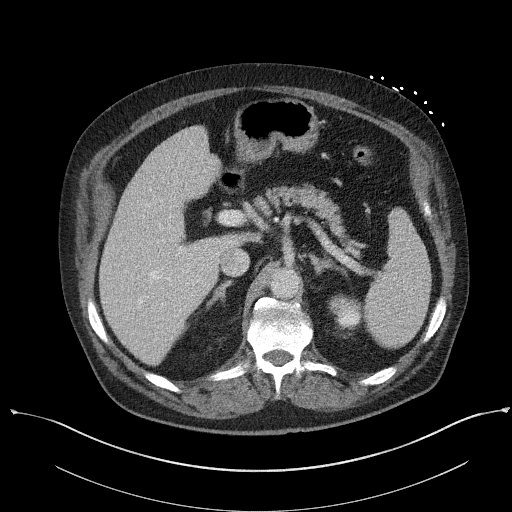
[im 72/101  lung]
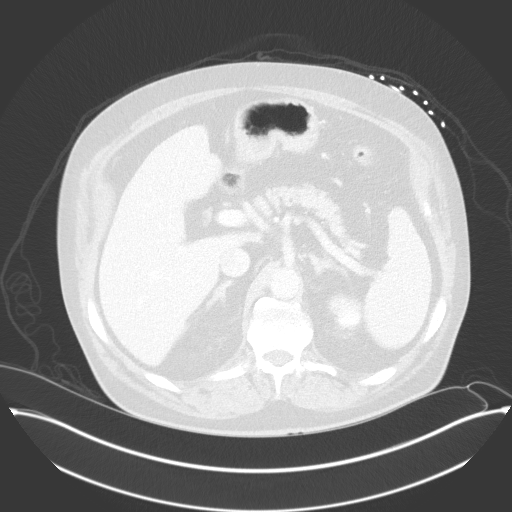
[im 86/101  soft-tissue]
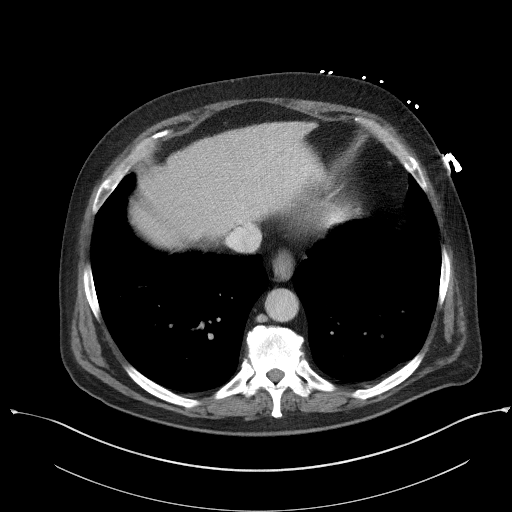
[im 86/101  lung]
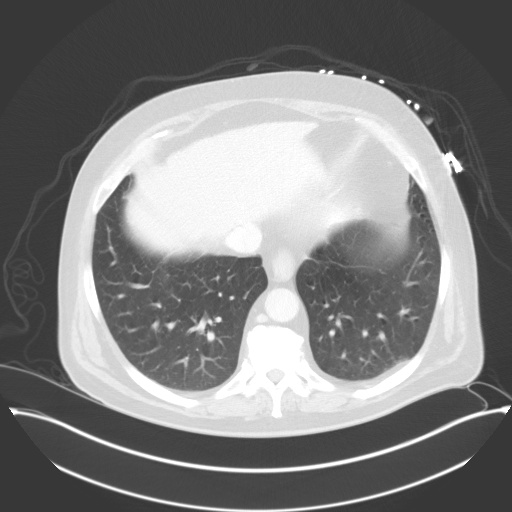

[Series 8: coronal mpr · coronal · 0.86mm/px · 2 of 176 slices shown, 3 images]
[im 59/176  soft-tissue]
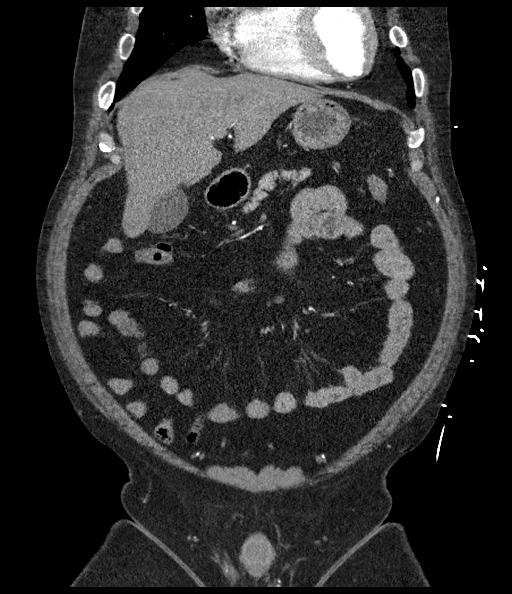
[im 59/176  bone]
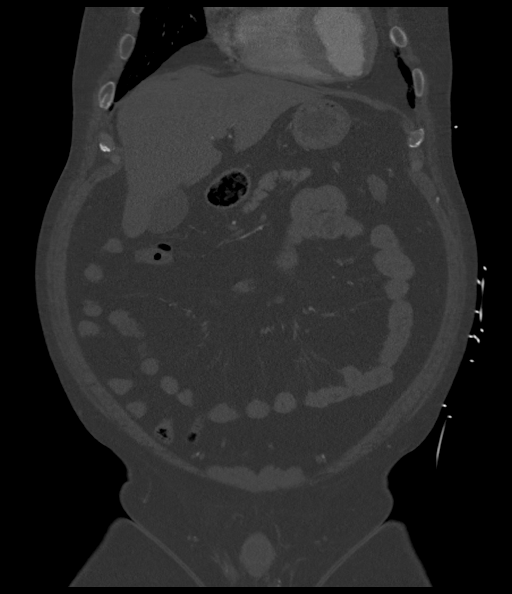
[im 117/176  soft-tissue]
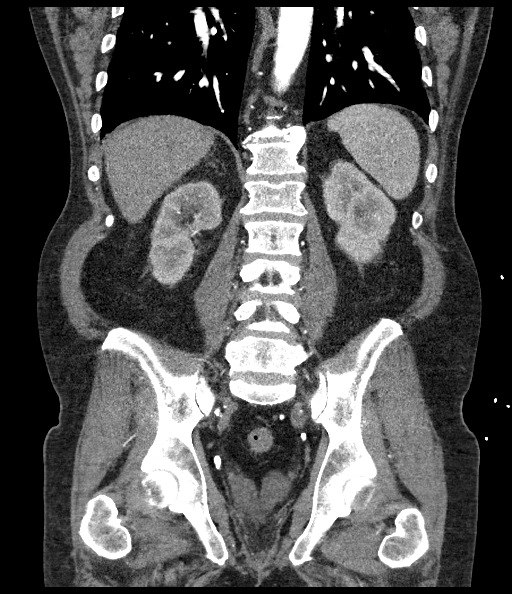

[10 of 46 positions shown; findings below may reference images not displayed]

FINDINGS: VASCULAR

Aorta: Atherosclerosis of the abdominal aorta without significant
aneurysm, dissection, occlusive process, retroperitoneal hemorrhage.

Celiac: Patent without evidence of aneurysm, dissection, vasculitis
or significant stenosis.

SMA: Patent without evidence of aneurysm, dissection, vasculitis or
significant stenosis.

Renals: Widely patent left renal artery. Irregularity and
atherosclerosis of the right renal origin. Difficult to exclude
proximal right renal artery significant stenosis.

IMA: Atherosclerotic origin but remains patent.

Inflow: Calcific atherosclerosis of the iliac vessels. Common,
internal and external iliac arteries remain patent. Mild left
external iliac atherosclerotic changes and luminal narrowing without
occlusion.

Proximal Outflow: Atherosclerosis of the patent common femoral and
profunda femoral arteries. Proximal right SFA is patent. Proximal
left SFA is chronically occluded.

Veins: Hepatic, portal, splenic, mesenteric, and renal veins remain
patent. IVC and iliac veins are patent. No Marquisha process.

Review of the MIP images confirms the above findings.

NON-VASCULAR

Lower chest: No acute finding. Clear lung bases. Normal heart size.
No pericardial or pleural effusion. Mild right infrahilar adenopathy
suspected, image 1 series 6.

Hepatobiliary: No focal hepatic abnormality or biliary dilatation.
Calcified gallstone noted measuring 2 cm. Biliary system
unremarkable. No biliary obstruction.

Pancreas: Unremarkable. No pancreatic ductal dilatation or
surrounding inflammatory changes.

Spleen: Normal in size without focal abnormality.

Adrenals/Urinary Tract: Normal adrenal glands for age.

Left kidney and ureter demonstrate no acute process, obstruction,
hydronephrosis, hydroureter, or obstructing ureteral calculus.

Right kidney and ureter demonstrate new acute hydroureteronephrosis.
There is perinephric and periureteral strandy edema. This extends to
the right UVJ, where there is a tiny punctate obstructing calculus
(faintly visualized) measuring 2-3 mm, best demonstrated on the
coronal reconstructions, image 109 series 13. Soft tissue prominence
at the right UVJ may be related to inflammation and edema from the
stone. Urinary bladder is underdistended.

Stomach/Bowel: Small hiatal hernia noted. Negative for bowel
obstruction, significant dilatation, ileus, or free air. Normal
appendix demonstrated. Scattered colonic diverticulosis with chronic
wall thickening of the sigmoid colon as before.

Lymphatic: Small nonenlarged retroperitoneal periaortic and
pericaval nodes. No definite adenopathy.

Reproductive: No significant or acute finding by CT. Prostate gland
normal in size.

Other: Small fat containing right inguinal hernia. Intact abdominal
wall. No ventral hernia.

Musculoskeletal: Degenerate changes noted. No acute osseous finding.
IMPRESSION: VASCULAR

Aortoiliac atherosclerosis without occlusion, significant aneurysm
or dissection.

Atherosclerotic changes of the right renal origin with luminal
narrowing, difficult to exclude significant right renal artery
stenosis. Left renal artery is widely patent.

Chronic occlusion of the proximal left SFA as before.

NON-VASCULAR

2-3 mm acutely obstructing right UVJ calculus (faintly visualized).
There is associated acute right hydroureteronephrosis.

Cholelithiasis

Diverticulosis with chronic sigmoid wall thickening.

Fat containing right inguinal hernia

## 2018-10-19 DIAGNOSIS — R635 Abnormal weight gain: Secondary | ICD-10-CM | POA: Insufficient documentation

## 2018-10-19 DIAGNOSIS — R600 Localized edema: Secondary | ICD-10-CM | POA: Insufficient documentation

## 2018-12-08 ENCOUNTER — Ambulatory Visit
Admission: EM | Admit: 2018-12-08 | Discharge: 2018-12-08 | Disposition: A | Discharge status: Home / Self Care | Payer: Commercial Managed Care - PPO | Attending: Family Medicine | Admitting: Family Medicine

## 2018-12-08 ENCOUNTER — Encounter: Payer: Self-pay | Admitting: Emergency Medicine

## 2018-12-08 ENCOUNTER — Other Ambulatory Visit: Payer: Self-pay

## 2018-12-08 DIAGNOSIS — R600 Localized edema: Secondary | ICD-10-CM | POA: Diagnosis not present

## 2018-12-08 MED ORDER — TRAMADOL HCL 50 MG PO TABS: 50.0000 mg | ORAL_TABLET | Freq: Two times a day (BID) | ORAL | 0 refills | Status: DC | PRN

## 2018-12-08 NOTE — Discharge Instructions (Signed)
Elevate your legs.  Watch sodium intake.   Follow up with [facility] clinic.   Pain medication if needed.

## 2018-12-08 NOTE — ED Provider Notes (Signed)
[facility]    CSN: [id number] Arrival date & time: 12/08/18  0944  History   Chief Complaint Chief Complaint  Patient presents with  . Foot Swelling    left   HPI  58 year old male presents with foot swelling.  Patient appears to have ongoing issues with lower extremity edema secondary to CHF.  Patient reports that over the weekend his left foot and ankle have been bothering him.  He has significant swelling bilaterally.  Patient denies fall, trauma, injury.  Patient states that he is having difficulty putting weight on his foot.  Patient states that this is an ongoing issue.  No fever.  Patient states that he has been compliant with his diet.  Patient's activity level has decreased as a result.  This is likely impacting his swelling.  No reports of shortness of breath at this time.  No other associated symptoms.  No other complaints.  PMH, Surgical Hx, Family Hx, Social History reviewed and updated as below.  Past Medical History:  Diagnosis Date  . Coronary artery disease    a. 12/17 PCI with DES to pLAD with resdiual disease (RX therapy)  . Hyperlipidemia   . Hypertension   . STEMI (ST elevation myocardial infarction) [facility](HCC) 2017    Patient Active Problem List   Diagnosis Date Noted  . Chest pain 02/02/2018  . Essential hypertension 06/20/2016  . Hyperlipidemia 06/20/2016  . CAD (coronary artery disease), native coronary artery 06/20/2016  . STEMI involving oth coronary artery of anterior wall (HCC) 06/17/2016  . STEMI (ST elevation myocardial infarction) (HCC) 06/17/2016    Past Surgical History:  Procedure Laterality Date  . CARDIAC CATHETERIZATION N/A 06/17/2016   Procedure: Left Heart Cath and Coronary Angiography;  Surgeon: Runell GessJonathan J Berry, MD;  [facility];  Service: Cardiovascular;  Laterality: N/A;  . CARDIAC CATHETERIZATION N/A 06/17/2016   Procedure: Coronary Stent Intervention;  Surgeon: Runell GessJonathan J Berry, MD;  Location: [facility]  INVASIVE CV LAB;  Service: Cardiovascular;  Laterality: N/A;  . cardiac stents    . CERVICAL SPINE SURGERY         Home Medications    Prior to Admission medications   Medication Sig Start Date End Date Taking? Authorizing Provider  aspirin 81 MG EC tablet Take 1 tablet (81 mg total) by mouth daily. 02/04/18  Yes Shaune Pollackhen, Qing, MD  carvedilol (COREG) 6.25 MG tablet Take 1 tablet (6.25 mg total) by mouth 2 (two) times daily with a meal. 06/20/16  Yes Laverda Pageoberts, Lindsay B, NP  furosemide (LASIX) 20 MG tablet TAKE 1 TABLET BY MOUTH EVERY DAY AS NEEDED 12/14/17  Yes Runell GessBerry, Jonathan J, MD  isosorbide mononitrate (IMDUR) 60 MG 24 hr tablet Take 1 tablet (60 mg total) by mouth daily. 02/04/18  Yes Shaune Pollackhen, Qing, MD  lisinopril (PRINIVIL,ZESTRIL) 20 MG tablet Take 20 mg by mouth daily. 11/09/17  Yes [provider]  nitroGLYCERIN (NITROSTAT) 0.4 MG SL tablet Place 1 tablet (0.4 mg total) under the tongue every 5 (five) minutes as needed. 06/20/16  Yes Arty Baumgartneroberts, Lindsay B, NP  atorvastatin (LIPITOR) 80 MG tablet Take 80 mg by mouth daily. 08/13/18   [provider]  traMADol (ULTRAM) 50 MG tablet Take 1 tablet (50 mg total) by mouth every 12 (twelve) hours as needed for moderate pain. 12/08/18   Tommie Samsook, Marcelyn Ruppe G, DO    Family History Family History  Problem Relation Age of Onset  . CAD Paternal Grandfather   . Breast cancer Mother   .  Parkinson's disease Father   . Alzheimer's disease Father   . Hypertension Father   . Diabetes Father     Social History Social History   Tobacco Use  . Smoking status: Current Every Day Smoker    Packs/day: 1.00    Types: Cigarettes  . Smokeless tobacco: Never Used  Substance Use Topics  . Alcohol use: Not Currently    Comment: weekends  . Drug use: Yes    Frequency: 1.0 times per week    Types: Marijuana     Allergies   Patient has no known allergies.   Review of Systems Review of Systems  Constitutional: Negative.   Cardiovascular: Positive  for leg swelling.   Physical Exam Triage Vital Signs ED Triage Vitals  Enc Vitals Group     BP 12/08/18 1005 (!) 149/101     Pulse Rate 12/08/18 1005 87     Resp 12/08/18 1005 18     Temp 12/08/18 1005 98.4 F (36.9 C)     Temp Source 12/08/18 1005 Oral     SpO2 12/08/18 1005 96 %     Weight 12/08/18 1001 184 lb (83.5 kg)     Height 12/08/18 1001 5\' 7"  (1.702 m)     Head Circumference --      Peak Flow --      Pain Score 12/08/18 1001 4     Pain Loc --      Pain Edu? --      Excl. in GC? --    Updated Vital Signs BP (!) 149/101 (BP Location: Left Arm)   Pulse 87   Temp 98.4 F (36.9 C) (Oral)   Resp 18   Ht 5\' 7"  (1.702 m)   Wt 83.5 kg   SpO2 96%   BMI 28.82 kg/m   Visual Acuity Right Eye Distance:   Left Eye Distance:   Bilateral Distance:    Right Eye Near:   Left Eye Near:    Bilateral Near:     Physical Exam Vitals signs and nursing note reviewed.  Constitutional:      General: He is not in acute distress.    Appearance: Normal appearance.  HENT:     Head: Normocephalic and atraumatic.  Eyes:     General:        Right eye: No discharge.        Left eye: No discharge.     Conjunctiva/sclera: Conjunctivae normal.  Pulmonary:     Effort: Pulmonary effort is normal. No respiratory distress.  Musculoskeletal:     Comments: Bilateral lower extremity edema.  1+.  Patient with mild erythema around the left medial malleolus and the dorsum of the foot.    Neurological:     Mental Status: He is alert.  Psychiatric:        Mood and Affect: Mood normal.        Behavior: Behavior normal.    UC Treatments / Results  Labs (all labs ordered are listed, but only abnormal results are displayed) Labs Reviewed - No data to display  EKG None  Radiology No results found.  Procedures Procedures (including critical care time)  Medications Ordered in UC Medications - No data to display  Initial Impression / Assessment and Plan / UC Course  I have reviewed  the triage vital signs and the nursing notes.  Pertinent labs & imaging results that were available during my care of the patient were reviewed by me and considered in my medical decision  making (see chart for details).    58 year old male presents with lower extremity edema.  This is secondary to venous insufficiency as well as congestive heart failure.  No evidence of DVT at this time.  Nothing in the history to suggest DVT or underlying injury.  Advised rest and elevation.  Continue use of Lasix.  Work note given.  Tramadol for pain if needed.  Needs to establish with primary care.  Final Clinical Impressions(s) / UC Diagnoses   Final diagnoses:  Lower extremity edema     Discharge Instructions     Elevate your legs.  Watch sodium intake.   Follow up with [facility] clinic.   Pain medication if needed.    ED Prescriptions    Medication Sig Dispense Auth. Provider   traMADol (ULTRAM) 50 MG tablet Take 1 tablet (50 mg total) by mouth every 12 (twelve) hours as needed for moderate pain. 10 tablet Tommie Sams, DO     Controlled Substance Prescriptions [place] Controlled Substance Registry consulted? Not Applicable   Tommie Sams, DO 12/08/18 1037

## 2018-12-08 NOTE — ED Triage Notes (Signed)
Pt c/o left foot swelling. He has been dealing with swelling from CHF since March and has been out of work. He states that his foot is painful mainly from the arch to the top of his foot. He request a note for work.

## 2018-12-13 ENCOUNTER — Other Ambulatory Visit: Payer: Self-pay

## 2018-12-13 ENCOUNTER — Other Ambulatory Visit
Admission: RE | Admit: 2018-12-13 | Discharge: 2018-12-13 | Disposition: A | Discharge status: Home / Self Care | Payer: Commercial Managed Care - PPO | Source: Ambulatory Visit | Attending: *Deleted | Admitting: *Deleted

## 2018-12-13 ENCOUNTER — Other Ambulatory Visit: Payer: Self-pay | Admitting: Student

## 2018-12-13 ENCOUNTER — Ambulatory Visit
Admission: RE | Admit: 2018-12-13 | Discharge: 2018-12-13 | Disposition: A | Discharge status: Home / Self Care | Payer: Commercial Managed Care - PPO | Source: Ambulatory Visit | Attending: Student | Admitting: Student

## 2018-12-13 DIAGNOSIS — M7989 Other specified soft tissue disorders: Secondary | ICD-10-CM

## 2018-12-13 LAB — FIBRIN DERIVATIVES D-DIMER (ARMC ONLY): Fibrin derivatives D-dimer (ARMC): 1217.48 ng/mL (FEU) — ABNORMAL HIGH (ref 0.00–499.00)

## 2019-03-24 ENCOUNTER — Other Ambulatory Visit: Payer: Self-pay | Admitting: *Deleted

## 2019-03-24 DIAGNOSIS — Z20822 Contact with and (suspected) exposure to covid-19: Secondary | ICD-10-CM

## 2019-03-25 LAB — NOVEL CORONAVIRUS, NAA: SARS-CoV-2, NAA: NOT DETECTED

## 2019-03-28 ENCOUNTER — Telehealth: Payer: Self-pay | Admitting: General Practice

## 2019-03-28 NOTE — Telephone Encounter (Signed)
Negative COVID results given. Patient results "NOT Detected." Caller expressed understanding.   Pt would like to know if we could fax over test results to work.   [employer] [phone]

## 2019-04-04 NOTE — Telephone Encounter (Signed)
Per pt. Request, COVID test results were faxed to employer; [employer] @ [phone].

## 2019-05-30 ENCOUNTER — Ambulatory Visit
Admission: EM | Admit: 2019-05-30 | Discharge: 2019-05-30 | Disposition: A | Discharge status: Home / Self Care | Payer: Commercial Managed Care - PPO | Attending: Family Medicine | Admitting: Family Medicine

## 2019-05-30 ENCOUNTER — Other Ambulatory Visit: Payer: Self-pay

## 2019-05-30 DIAGNOSIS — M109 Gout, unspecified: Secondary | ICD-10-CM | POA: Diagnosis not present

## 2019-05-30 MED ORDER — OXYCODONE-ACETAMINOPHEN 5-325 MG PO TABS: 1.0000 | ORAL_TABLET | Freq: Four times a day (QID) | ORAL | 0 refills | Status: DC | PRN

## 2019-05-30 MED ORDER — PREDNISONE 10 MG PO TABS: ORAL_TABLET | ORAL | 0 refills | Status: DC

## 2019-05-30 NOTE — ED Triage Notes (Signed)
Pt with left ankle/foot pain. States it is his gout. Pain from ankle to heel to middle of foot. Started Friday morning

## 2019-05-30 NOTE — ED Provider Notes (Signed)
[facility] ____________________________________________  Time seen: Approximately 4:31 PM  I have reviewed the triage vital signs and the nursing notes.   HISTORY  Chief Complaint Foot Pain   HPI Cameron [name] is a 58 y.o. male history of MI, hypertension, CHF, presenting for evaluation of the left foot and ankle pain.  Patient reports pain started Friday morning.  Reports going to bed Thursday night felt fine, woke up with pain.  Denies any injury or direct trauma.  States feels consistent with previous gout flares and in same location.  States tender to touch.  Able to continue to weight-bear but painful.  No pain radiation, paresthesias.  States mild swelling in the mid foot, no swelling otherwise to lower extremity.  No skin changes or break in skin.  Unresolved with over-the-counter Tylenol and Advil.  Denies chest pain or shortness of breath or recent sickness.  No fevers.    Past Medical History:  Diagnosis Date  . Coronary artery disease    a. 12/17 PCI with DES to pLAD with resdiual disease (RX therapy)  . Hyperlipidemia   . Hypertension   . STEMI (ST elevation myocardial infarction) [facility]) 2017    Patient Active Problem List   Diagnosis Date Noted  . Chest pain 02/02/2018  . Essential hypertension 06/20/2016  . Hyperlipidemia 06/20/2016  . CAD (coronary artery disease), native coronary artery 06/20/2016  . STEMI involving oth coronary artery of anterior wall (HCC) 06/17/2016  . STEMI (ST elevation myocardial infarction) (HCC) 06/17/2016    Past Surgical History:  Procedure Laterality Date  . CARDIAC CATHETERIZATION N/A 06/17/2016   Procedure: Left Heart Cath and Coronary Angiography;  Surgeon: Runell Gess, MD;  Location: [facility] INVASIVE CV LAB;  Service: Cardiovascular;  Laterality: N/A;  . CARDIAC CATHETERIZATION N/A 06/17/2016   Procedure: Coronary Stent Intervention;  Surgeon: Runell Gess, MD;  Location: [facility] INVASIVE CV LAB;  Service:  Cardiovascular;  Laterality: N/A;  . cardiac stents    . CERVICAL SPINE SURGERY       No current facility-administered medications for this encounter.   Current Outpatient Medications:  .  aspirin 81 MG EC tablet, Take 1 tablet (81 mg total) by mouth daily., Disp: 30 tablet, Rfl: 1 .  atorvastatin (LIPITOR) 80 MG tablet, Take 80 mg by mouth daily., Disp: , Rfl:  .  carvedilol (COREG) 6.25 MG tablet, Take 1 tablet (6.25 mg total) by mouth 2 (two) times daily with a meal., Disp: 60 tablet, Rfl: 6 .  furosemide (LASIX) 20 MG tablet, TAKE 1 TABLET BY MOUTH EVERY DAY AS NEEDED, Disp: 30 tablet, Rfl: 0 .  lisinopril (PRINIVIL,ZESTRIL) 20 MG tablet, Take 20 mg by mouth daily., Disp: , Rfl: 4 .  nitroGLYCERIN (NITROSTAT) 0.4 MG SL tablet, Place 1 tablet (0.4 mg total) under the tongue every 5 (five) minutes as needed., Disp: 25 tablet, Rfl: 3 .  oxyCODONE-acetaminophen (PERCOCET/ROXICET) 5-325 MG tablet, Take 1 tablet by mouth every 6 (six) hours as needed for severe pain. Do not drive while taking as can cause drowsiness., Disp: 8 tablet, Rfl: 0 .  predniSONE (DELTASONE) 10 MG tablet, Start 60 mg po day one, then 50 mg po day two, taper by 10 mg daily until complete., Disp: 21 tablet, Rfl: 0  Allergies Patient has no known allergies.  Family History  Problem Relation Age of Onset  . CAD Paternal Grandfather   . Breast cancer Mother   . Parkinson's disease Father   . Alzheimer's disease Father   .  Hypertension Father   . Diabetes Father     Social History Social History   Tobacco Use  . Smoking status: Current Every Day Smoker    Packs/day: 1.00    Types: Cigarettes  . Smokeless tobacco: Never Used  Substance Use Topics  . Alcohol use: Yes    Comment: weekends  . Drug use: Yes    Frequency: 1.0 times per week    Types: Marijuana    Comment: twice monthly    Review of Systems Constitutional: No fever ENT: No sore throat. Cardiovascular: Denies chest pain. Respiratory:  Denies shortness of breath. Gastrointestinal: No abdominal pain.   Genitourinary: Negative for dysuria. Musculoskeletal: N Positive left foot pain. Skin: Negative for rash. Neurological: Negative for headaches, focal weakness or numbness.   ____________________________________________   PHYSICAL EXAM:  VITAL SIGNS: ED Triage Vitals  Enc Vitals Group     BP 05/30/19 1542 116/79     Pulse Rate 05/30/19 1542 88     Resp 05/30/19 1542 16     Temp --      Temp src --      SpO2 05/30/19 1542 99 %     Weight 05/30/19 1544 175 lb (79.4 kg)     Height 05/30/19 1544 5\' 7"  (1.702 m)     Head Circumference --      Peak Flow --      Pain Score 05/30/19 1544 5     Pain Loc --      Pain Edu? --      Excl. in GC? --     Constitutional: Alert and oriented. Well appearing and in no acute distress. Eyes: Conjunctivae are normal. . ENT      Head: Normocephalic and atraumatic. Cardiovascular: Normal rate, regular rhythm. Grossly normal heart sounds.  Good peripheral circulation. Respiratory: Normal respiratory effort without tachypnea nor retractions. Breath sounds are clear and equal bilaterally. No wheezes, rales, rhonchi. Musculoskeletal: Steady gait. Bilateral pedal pulses equal and easily palpated.   Except: Left lateral and and medial malleolus mild localized swelling, no point bony tenderness, tenderness to ankle and mildly to midfoot, mild warmth, pain with rotation but overall good range of motion, no point bony tenderness, left lower extremity otherwise nonedematous, normal distal sensation and capillary refill. Neurologic:  Normal speech and language. Speech is normal. No gait instability.  Skin:  Skin is warm, dry and intact. No rash noted. Psychiatric: Mood and affect are normal. Speech and behavior are normal. Patient exhibits appropriate insight and judgment   ___________________________________________   LABS (all labs ordered are listed, but only abnormal results are  displayed)  Labs Reviewed - No data to display ____________________________________________   PROCEDURES Procedures    INITIAL IMPRESSION / ASSESSMENT AND PLAN / ED COURSE  Pertinent labs & imaging results that were available during my care of the patient were reviewed by me and considered in my medical decision making (see chart for details). Well-appearing patient.  No acute distress.  Left foot and ankle pain nontraumatic.  Consistent with his previous gout flares, suspect the same.  Will treat with prednisone and as needed Percocet.  Elevate, supportive care and monitor.  [place] controlled substance database reviewed, no recent controlled substances documented.Discussed indication, risks and benefits of medications with patient.   Discussed follow up with Primary care physician this week. Discussed follow up and return parameters including no resolution or any worsening concerns. Patient verbalized understanding and agreed to plan.   ____________________________________________   FINAL CLINICAL  IMPRESSION(S) / ED DIAGNOSES  Final diagnoses:  Acute gout of left foot, unspecified cause     ED Discharge Orders         Ordered    predniSONE (DELTASONE) 10 MG tablet     05/30/19 1615    oxyCODONE-acetaminophen (PERCOCET/ROXICET) 5-325 MG tablet  Every 6 hours PRN     05/30/19 1615           Note: This dictation was prepared with Dragon dictation along with smaller phrase technology. Any transcriptional errors that result from this process are unintentional.         Marylene Land, NP 05/30/19 1641

## 2019-05-30 NOTE — Discharge Instructions (Addendum)
Take medication as prescribed. Rest. Drink plenty of fluids.  ° °Follow up with your primary care physician this week as needed. Return to Urgent care for new or worsening concerns.  ° °

## 2019-09-26 ENCOUNTER — Other Ambulatory Visit: Payer: Commercial Managed Care - PPO

## 2019-09-29 ENCOUNTER — Other Ambulatory Visit: Payer: Commercial Managed Care - PPO

## 2020-02-17 ENCOUNTER — Emergency Department: Payer: Commercial Managed Care - PPO

## 2020-02-17 ENCOUNTER — Encounter: Payer: Self-pay | Admitting: Emergency Medicine

## 2020-02-17 ENCOUNTER — Inpatient Hospital Stay
Admission: EM | Admit: 2020-02-17 | Discharge: 2020-02-21 | DRG: 292 | Disposition: A | Discharge status: Home / Self Care | Payer: Commercial Managed Care - PPO | Attending: Internal Medicine | Admitting: Internal Medicine

## 2020-02-17 ENCOUNTER — Inpatient Hospital Stay
Admit: 2020-02-17 | Discharge: 2020-02-17 | Disposition: A | Discharge status: Home / Self Care | Payer: Commercial Managed Care - PPO | Attending: Internal Medicine | Admitting: Internal Medicine

## 2020-02-17 ENCOUNTER — Other Ambulatory Visit: Payer: Self-pay

## 2020-02-17 DIAGNOSIS — I4891 Unspecified atrial fibrillation: Secondary | ICD-10-CM | POA: Diagnosis present

## 2020-02-17 DIAGNOSIS — Z79899 Other long term (current) drug therapy: Secondary | ICD-10-CM

## 2020-02-17 DIAGNOSIS — Z8249 Family history of ischemic heart disease and other diseases of the circulatory system: Secondary | ICD-10-CM | POA: Diagnosis not present

## 2020-02-17 DIAGNOSIS — I248 Other forms of acute ischemic heart disease: Secondary | ICD-10-CM | POA: Diagnosis present

## 2020-02-17 DIAGNOSIS — I10 Essential (primary) hypertension: Secondary | ICD-10-CM

## 2020-02-17 DIAGNOSIS — E876 Hypokalemia: Secondary | ICD-10-CM | POA: Diagnosis present

## 2020-02-17 DIAGNOSIS — I472 Ventricular tachycardia, unspecified: Secondary | ICD-10-CM

## 2020-02-17 DIAGNOSIS — I110 Hypertensive heart disease with heart failure: Secondary | ICD-10-CM | POA: Diagnosis present

## 2020-02-17 DIAGNOSIS — I252 Old myocardial infarction: Secondary | ICD-10-CM

## 2020-02-17 DIAGNOSIS — R778 Other specified abnormalities of plasma proteins: Secondary | ICD-10-CM

## 2020-02-17 DIAGNOSIS — Z820 Family history of epilepsy and other diseases of the nervous system: Secondary | ICD-10-CM

## 2020-02-17 DIAGNOSIS — Z20822 Contact with and (suspected) exposure to covid-19: Secondary | ICD-10-CM | POA: Diagnosis present

## 2020-02-17 DIAGNOSIS — Z833 Family history of diabetes mellitus: Secondary | ICD-10-CM

## 2020-02-17 DIAGNOSIS — I255 Ischemic cardiomyopathy: Secondary | ICD-10-CM | POA: Diagnosis present

## 2020-02-17 DIAGNOSIS — Z7982 Long term (current) use of aspirin: Secondary | ICD-10-CM

## 2020-02-17 DIAGNOSIS — M71122 Other infective bursitis, left elbow: Secondary | ICD-10-CM

## 2020-02-17 DIAGNOSIS — M109 Gout, unspecified: Secondary | ICD-10-CM | POA: Diagnosis present

## 2020-02-17 DIAGNOSIS — I509 Heart failure, unspecified: Secondary | ICD-10-CM

## 2020-02-17 DIAGNOSIS — Z803 Family history of malignant neoplasm of breast: Secondary | ICD-10-CM | POA: Diagnosis not present

## 2020-02-17 DIAGNOSIS — M7022 Olecranon bursitis, left elbow: Secondary | ICD-10-CM | POA: Diagnosis present

## 2020-02-17 DIAGNOSIS — I219 Acute myocardial infarction, unspecified: Secondary | ICD-10-CM | POA: Diagnosis present

## 2020-02-17 DIAGNOSIS — I5023 Acute on chronic systolic (congestive) heart failure: Secondary | ICD-10-CM | POA: Diagnosis present

## 2020-02-17 DIAGNOSIS — F17210 Nicotine dependence, cigarettes, uncomplicated: Secondary | ICD-10-CM | POA: Diagnosis present

## 2020-02-17 DIAGNOSIS — Z955 Presence of coronary angioplasty implant and graft: Secondary | ICD-10-CM | POA: Diagnosis not present

## 2020-02-17 DIAGNOSIS — E785 Hyperlipidemia, unspecified: Secondary | ICD-10-CM | POA: Diagnosis present

## 2020-02-17 DIAGNOSIS — Z720 Tobacco use: Secondary | ICD-10-CM | POA: Diagnosis present

## 2020-02-17 DIAGNOSIS — I513 Intracardiac thrombosis, not elsewhere classified: Secondary | ICD-10-CM | POA: Diagnosis present

## 2020-02-17 DIAGNOSIS — I2510 Atherosclerotic heart disease of native coronary artery without angina pectoris: Secondary | ICD-10-CM | POA: Diagnosis present

## 2020-02-17 DIAGNOSIS — I4729 Other ventricular tachycardia: Secondary | ICD-10-CM

## 2020-02-17 HISTORY — DX: Heart failure, unspecified: I50.9

## 2020-02-17 LAB — COMPREHENSIVE METABOLIC PANEL
ALT: 7 U/L (ref 0–44)
AST: 14 U/L — ABNORMAL LOW (ref 15–41)
Albumin: 3.3 g/dL — ABNORMAL LOW (ref 3.5–5.0)
Alkaline Phosphatase: 66 U/L (ref 38–126)
Anion gap: 14 (ref 5–15)
BUN: 14 mg/dL (ref 6–20)
CO2: 37 mmol/L — ABNORMAL HIGH (ref 22–32)
Calcium: 8.3 mg/dL — ABNORMAL LOW (ref 8.9–10.3)
Chloride: 91 mmol/L — ABNORMAL LOW (ref 98–111)
Creatinine, Ser: 1 mg/dL (ref 0.61–1.24)
GFR calc Af Amer: >60 mL/min (ref >60)
GFR calc non Af Amer: >60 mL/min (ref >60)
Glucose, Bld: 128 mg/dL — ABNORMAL HIGH (ref 70–99)
Potassium: 2.3 mmol/L — CL (ref 3.5–5.1)
Sodium: 142 mmol/L (ref 135–145)
Total Bilirubin: 1 mg/dL (ref 0.3–1.2)
Total Protein: 6.8 g/dL (ref 6.5–8.1)

## 2020-02-17 LAB — URINE DRUG SCREEN, QUALITATIVE (ARMC ONLY)
Amphetamines, Ur Screen: NOT DETECTED
Barbiturates, Ur Screen: NOT DETECTED
Benzodiazepine, Ur Scrn: NOT DETECTED
Cannabinoid 50 Ng, Ur ~~LOC~~: POSITIVE — AB
Cocaine Metabolite,Ur ~~LOC~~: NOT DETECTED
MDMA (Ecstasy)Ur Screen: NOT DETECTED
Methadone Scn, Ur: NOT DETECTED
Opiate, Ur Screen: NOT DETECTED
Phencyclidine (PCP) Ur S: NOT DETECTED
Tricyclic, Ur Screen: NOT DETECTED

## 2020-02-17 LAB — TROPONIN I (HIGH SENSITIVITY)
Troponin I (High Sensitivity): 176 ng/L (ref <18)
Troponin I (High Sensitivity): 212 ng/L (ref <18)
Troponin I (High Sensitivity): 159 ng/L (ref <18)

## 2020-02-17 LAB — HIV ANTIBODY (ROUTINE TESTING W REFLEX): HIV Screen 4th Generation wRfx: NONREACTIVE

## 2020-02-17 LAB — SYNOVIAL CELL COUNT + DIFF, W/ CRYSTALS
Eosinophils-Synovial: 0 %
Lymphocytes-Synovial Fld: 0 %
Monocyte-Macrophage-Synovial Fluid: 0 %
Neutrophil, Synovial: 100 %
Other Cells-SYN: 0
WBC, Synovial: UNDETERMINED /mm3 (ref 0–200)

## 2020-02-17 LAB — CBC WITH DIFFERENTIAL/PLATELET
Abs Immature Granulocytes: 0.05 10*3/uL (ref 0.00–0.07)
Basophils Absolute: 0.1 10*3/uL (ref 0.0–0.1)
Basophils Relative: 1 %
Eosinophils Absolute: 0.1 10*3/uL (ref 0.0–0.5)
Eosinophils Relative: 1 %
HCT: 42.7 % (ref 39.0–52.0)
Hemoglobin: 14.1 g/dL (ref 13.0–17.0)
Immature Granulocytes: 1 %
Lymphocytes Relative: 18 %
Lymphs Abs: 1.6 10*3/uL (ref 0.7–4.0)
MCH: 37 pg — ABNORMAL HIGH (ref 26.0–34.0)
MCHC: 33 g/dL (ref 30.0–36.0)
MCV: 112.1 fL — ABNORMAL HIGH (ref 80.0–100.0)
Monocytes Absolute: 0.8 10*3/uL (ref 0.1–1.0)
Monocytes Relative: 9 %
Neutro Abs: 6.4 10*3/uL (ref 1.7–7.7)
Neutrophils Relative %: 70 %
Platelets: 189 10*3/uL (ref 150–400)
RBC: 3.81 MIL/uL — ABNORMAL LOW (ref 4.22–5.81)
RDW: 15.8 % — ABNORMAL HIGH (ref 11.5–15.5)
Smear Review: NORMAL
WBC: 9.1 10*3/uL (ref 4.0–10.5)
nRBC: 0 % (ref 0.0–0.2)

## 2020-02-17 LAB — PROTIME-INR
INR: 1 (ref 0.8–1.2)
Prothrombin Time: 13.2 seconds (ref 11.4–15.2)

## 2020-02-17 LAB — BRAIN NATRIURETIC PEPTIDE: B Natriuretic Peptide: 2228.8 pg/mL — ABNORMAL HIGH (ref 0.0–100.0)

## 2020-02-17 LAB — MAGNESIUM: Magnesium: 1.4 mg/dL — ABNORMAL LOW (ref 1.7–2.4)

## 2020-02-17 LAB — C-REACTIVE PROTEIN: CRP: 6.1 mg/dL — ABNORMAL HIGH (ref <1.0)

## 2020-02-17 LAB — SEDIMENTATION RATE: Sed Rate: 7 mm/hr (ref 0–20)

## 2020-02-17 LAB — URIC ACID: Uric Acid, Serum: 11 mg/dL — ABNORMAL HIGH (ref 3.7–8.6)

## 2020-02-17 LAB — SARS CORONAVIRUS 2 BY RT PCR (HOSPITAL ORDER, PERFORMED IN ~~LOC~~ HOSPITAL LAB): SARS Coronavirus 2: NEGATIVE

## 2020-02-17 MED ORDER — CLINDAMYCIN PHOSPHATE 600 MG/50ML IV SOLN
600.0000 mg | Freq: Once | INTRAVENOUS | Status: AC
  Administered 2020-02-17: 600 mg via INTRAVENOUS
  Filled 2020-02-17: qty 50

## 2020-02-17 MED ORDER — ALBUTEROL SULFATE (2.5 MG/3ML) 0.083% IN NEBU: 3.0000 mL | INHALATION_SOLUTION | Freq: Four times a day (QID) | RESPIRATORY_TRACT | Status: DC | PRN

## 2020-02-17 MED ORDER — OXYCODONE-ACETAMINOPHEN 5-325 MG PO TABS: 1.0000 | ORAL_TABLET | ORAL | Status: DC | PRN

## 2020-02-17 MED ORDER — ACETAMINOPHEN 325 MG PO TABS: 650.0000 mg | ORAL_TABLET | Freq: Four times a day (QID) | ORAL | Status: DC | PRN

## 2020-02-17 MED ORDER — SODIUM CHLORIDE 0.9 % IV SOLN
250.0000 mL | INTRAVENOUS | Status: DC | PRN
  Administered 2020-02-18: 75 mL via INTRAVENOUS
  Administered 2020-02-19: 50 mL via INTRAVENOUS

## 2020-02-17 MED ORDER — METHYLPREDNISOLONE SODIUM SUCC 40 MG IJ SOLR
20.0000 mg | Freq: Two times a day (BID) | INTRAMUSCULAR | Status: DC
  Administered 2020-02-17 – 2020-02-19 (×6): 20 mg via INTRAVENOUS
  Filled 2020-02-17 (×6): qty 1

## 2020-02-17 MED ORDER — ATORVASTATIN CALCIUM 80 MG PO TABS
80.0000 mg | ORAL_TABLET | Freq: Every day | ORAL | Status: DC
  Administered 2020-02-17 – 2020-02-21 (×5): 80 mg via ORAL
  Filled 2020-02-17 (×2): qty 1
  Filled 2020-02-17: qty 4
  Filled 2020-02-17 (×2): qty 1

## 2020-02-17 MED ORDER — CARVEDILOL 25 MG PO TABS
25.0000 mg | ORAL_TABLET | Freq: Two times a day (BID) | ORAL | Status: DC
  Administered 2020-02-17 – 2020-02-21 (×8): 25 mg via ORAL
  Filled 2020-02-17 (×8): qty 1

## 2020-02-17 MED ORDER — HYDRALAZINE HCL 20 MG/ML IJ SOLN: 5.0000 mg | INTRAMUSCULAR | Status: DC | PRN

## 2020-02-17 MED ORDER — LIDOCAINE-PRILOCAINE 2.5-2.5 % EX CREA
TOPICAL_CREAM | Freq: Once | CUTANEOUS | Status: AC
  Administered 2020-02-17: 1 via TOPICAL

## 2020-02-17 MED ORDER — ASPIRIN EC 81 MG PO TBEC
81.0000 mg | DELAYED_RELEASE_TABLET | Freq: Every day | ORAL | Status: DC
  Administered 2020-02-17 – 2020-02-21 (×5): 81 mg via ORAL
  Filled 2020-02-17 (×5): qty 1

## 2020-02-17 MED ORDER — SODIUM CHLORIDE 0.9 % IV SOLN
1.0000 g | INTRAVENOUS | Status: DC
  Administered 2020-02-17 – 2020-02-19 (×3): 1 g via INTRAVENOUS
  Filled 2020-02-17 (×3): qty 10

## 2020-02-17 MED ORDER — POTASSIUM CHLORIDE CRYS ER 20 MEQ PO TBCR
40.0000 meq | EXTENDED_RELEASE_TABLET | Freq: Once | ORAL | Status: AC
  Administered 2020-02-18: 40 meq via ORAL

## 2020-02-17 MED ORDER — FUROSEMIDE 10 MG/ML IJ SOLN
20.0000 mg | Freq: Two times a day (BID) | INTRAMUSCULAR | Status: DC
  Administered 2020-02-17 – 2020-02-20 (×6): 20 mg via INTRAVENOUS
  Filled 2020-02-17 (×6): qty 2

## 2020-02-17 MED ORDER — POTASSIUM CHLORIDE CRYS ER 20 MEQ PO TBCR
40.0000 meq | EXTENDED_RELEASE_TABLET | Freq: Once | ORAL | Status: AC
  Administered 2020-02-17: 40 meq via ORAL
  Filled 2020-02-17: qty 2

## 2020-02-17 MED ORDER — SODIUM CHLORIDE 0.9% FLUSH
3.0000 mL | Freq: Two times a day (BID) | INTRAVENOUS | Status: DC
  Administered 2020-02-17 – 2020-02-21 (×7): 3 mL via INTRAVENOUS

## 2020-02-17 MED ORDER — HEPARIN SODIUM (PORCINE) 5000 UNIT/ML IJ SOLN
5000.0000 [IU] | Freq: Three times a day (TID) | INTRAMUSCULAR | Status: DC
  Administered 2020-02-17 – 2020-02-18 (×4): 5000 [IU] via SUBCUTANEOUS
  Filled 2020-02-17 (×4): qty 1

## 2020-02-17 MED ORDER — POTASSIUM CHLORIDE 10 MEQ/100ML IV SOLN
10.0000 meq | INTRAVENOUS | Status: AC
  Administered 2020-02-17 (×2): 10 meq via INTRAVENOUS
  Filled 2020-02-17 (×2): qty 100

## 2020-02-17 MED ORDER — SODIUM CHLORIDE 0.9% FLUSH
3.0000 mL | INTRAVENOUS | Status: DC | PRN
  Administered 2020-02-21: 3 mL via INTRAVENOUS

## 2020-02-17 MED ORDER — NITROGLYCERIN 0.4 MG SL SUBL: 0.4000 mg | SUBLINGUAL_TABLET | SUBLINGUAL | Status: DC | PRN

## 2020-02-17 MED ORDER — FUROSEMIDE 10 MG/ML IJ SOLN
40.0000 mg | Freq: Once | INTRAMUSCULAR | Status: AC
  Administered 2020-02-17: 40 mg via INTRAVENOUS
  Filled 2020-02-17: qty 4

## 2020-02-17 MED ORDER — PERFLUTREN LIPID MICROSPHERE
1.0000 mL | INTRAVENOUS | Status: DC | PRN
  Administered 2020-02-17: 4 mL via INTRAVENOUS
  Filled 2020-02-17: qty 10

## 2020-02-17 MED ORDER — NICOTINE 21 MG/24HR TD PT24
21.0000 mg | MEDICATED_PATCH | Freq: Every day | TRANSDERMAL | Status: DC
  Administered 2020-02-17 – 2020-02-21 (×5): 21 mg via TRANSDERMAL
  Filled 2020-02-17 (×5): qty 1

## 2020-02-17 MED ORDER — LISINOPRIL 20 MG PO TABS
20.0000 mg | ORAL_TABLET | Freq: Every day | ORAL | Status: DC
  Administered 2020-02-17 – 2020-02-20 (×4): 20 mg via ORAL
  Filled 2020-02-17: qty 1
  Filled 2020-02-17: qty 2
  Filled 2020-02-17 (×2): qty 1

## 2020-02-17 MED ORDER — MAGNESIUM SULFATE 2 GM/50ML IV SOLN
2.0000 g | Freq: Once | INTRAVENOUS | Status: AC
  Administered 2020-02-17: 2 g via INTRAVENOUS
  Filled 2020-02-17: qty 50

## 2020-02-17 MED ORDER — ONDANSETRON HCL 4 MG/2ML IJ SOLN: 4.0000 mg | Freq: Three times a day (TID) | INTRAMUSCULAR | Status: DC | PRN

## 2020-02-17 NOTE — ED Notes (Signed)
EKG indicates afib--pt denies any hx of such

## 2020-02-17 NOTE — H&P (Signed)
History and Physical    ARVID MARENGO WNI:[id number] DOB: 1960-10-04 DOA: 02/17/2020  Referring MD/NP/PA:   PCP: Patient, No Pcp Per   Patient coming from:  The patient is coming from home.  At baseline, pt is independent for most of ADL.        Chief Complaint: Worsening bilateral leg edema and left elbow pain  HPI: Cameron [name] is a 59 y.o. male with medical history significant of hypertension, hyperlipidemia, CAD, STEMI, sCHF with EF of 20-25%, tobacco abuse, apical mural thrombus, who presents with worsening bilateral leg edema and left elbow pain.  Patient states that he has worsening bilateral leg edema in the past several days.  No cough, chest pain or worsening shortness of breath.  No fever or chills.  Patient denies nausea vomiting, diarrhea, abdominal pain, symptoms of UTI or unilateral weakness. Patient also has left elbow pain which has been going for several days.  Pain is constant, moderate, sharp, nonradiating. Patient reports that he is supposed to be on Eliquis for apical mural thrombus, but stopped taking it because he used to bruise up very easily.   Patient was found to have new onset atrial fibrillation with heart rate in the 90s in ED. EDP collected diagnostic bursa fluid from left elbow for crystal and culture analysis, which showed turbid appearance, extracellular and intracellular monosodium urate crystals.   ED Course: pt was found to have WBC 9.1, BNP 2228, troponin I 76, negative Covid PCR, uric acid 11, potassium 2.3, renal function okay, temperature normal, blood pressure 141/90, heart rate 95, RR 18, oxygen saturation 98% on room air.  Chest x-ray with cardiomegaly and mild vascular congestion.  Patient is admitted to progressive bed as inpatient.  Dr. Joice Lofts of Ortho is consulted.  Dr. Darrold Junker of cardiology is consulted.  Review of Systems:   General: no fevers, chills, no body weight gain, has fatigue HEENT: no blurry vision, hearing changes or sore  throat Respiratory: no dyspnea, coughing, wheezing CV: no chest pain, no palpitations GI: no nausea, vomiting, abdominal pain, diarrhea, constipation GU: no dysuria, burning on urination, increased urinary frequency, hematuria  Ext: has leg edema Neuro: no unilateral weakness, numbness, or tingling, no vision change or hearing loss Skin: no rash, no skin tear. MSK: No muscle spasm, no deformity, no limitation of range of movement in spin. Has pain in left elbow Heme: No easy bruising.  Travel history: No recent long distant travel.  Allergy: No Known Allergies  Past Medical History:  Diagnosis Date  . CHF (congestive heart failure) (HCC)   . Coronary artery disease    a. 12/17 PCI with DES to pLAD with resdiual disease (RX therapy)  . Hyperlipidemia   . Hypertension   . STEMI (ST elevation myocardial infarction) (HCC) 2017    Past Surgical History:  Procedure Laterality Date  . CARDIAC CATHETERIZATION N/A 06/17/2016   Procedure: Left Heart Cath and Coronary Angiography;  Surgeon: Runell Gess, MD;  Location: [facility] INVASIVE CV LAB;  Service: Cardiovascular;  Laterality: N/A;  . CARDIAC CATHETERIZATION N/A 06/17/2016   Procedure: Coronary Stent Intervention;  Surgeon: Runell Gess, MD;  Location: [facility] INVASIVE CV LAB;  Service: Cardiovascular;  Laterality: N/A;  . cardiac stents    . CERVICAL SPINE SURGERY      Social History:  reports that he has been smoking cigarettes. He has been smoking about 1.00 pack per day. He has never used smokeless tobacco. He reports current alcohol use. He reports current drug  use. Frequency: 1.00 time per week. Drug: Marijuana.  Family History:  Family History  Problem Relation Age of Onset  . CAD Paternal Grandfather   . Breast cancer Mother   . Parkinson's disease Father   . Alzheimer's disease Father   . Hypertension Father   . Diabetes Father      Prior to Admission medications   Medication Sig Start Date End Date Taking?  Authorizing Provider  aspirin 81 MG EC tablet Take 1 tablet (81 mg total) by mouth daily. 02/04/18   [name], MD  atorvastatin (LIPITOR) 80 MG tablet Take 80 mg by mouth daily. 08/13/18   [provider]  carvedilol (COREG) 6.25 MG tablet Take 1 tablet (6.25 mg total) by mouth 2 (two) times daily with a meal. 06/20/16   Laverda Page B, NP  furosemide (LASIX) 20 MG tablet TAKE 1 TABLET BY MOUTH EVERY DAY AS NEEDED 12/14/17   Runell Gess, MD  lisinopril (PRINIVIL,ZESTRIL) 20 MG tablet Take 20 mg by mouth daily. 11/09/17   [provider]  nitroGLYCERIN (NITROSTAT) 0.4 MG SL tablet Place 1 tablet (0.4 mg total) under the tongue every 5 (five) minutes as needed. 06/20/16   Arty Baumgartner, NP  oxyCODONE-acetaminophen (PERCOCET/ROXICET) 5-325 MG tablet Take 1 tablet by mouth every 6 (six) hours as needed for severe pain. Do not drive while taking as can cause drowsiness. 05/30/19   Renford Dills, NP  predniSONE (DELTASONE) 10 MG tablet Start 60 mg po day one, then 50 mg po day two, taper by 10 mg daily until complete. 05/30/19   Renford Dills, NP  isosorbide mononitrate (IMDUR) 60 MG 24 hr tablet Take 1 tablet (60 mg total) by mouth daily. 02/04/18 05/30/19  [name], MD    Physical Exam: Vitals:   02/17/20 1015 02/17/20 1018 02/17/20 1028 02/17/20 1031  BP:  112/79  127/84  Pulse: (!) 111  (!) 103 (!) 109  Resp: 20  17 15   Temp:      TempSrc:      SpO2: (!) 89%  100% 97%  Weight:      Height:       General: Not in acute distress HEENT:       Eyes: PERRL, EOMI, no scleral icterus.       ENT: No discharge from the ears and nose, no pharynx injection, no tonsillar enlargement.        Neck: No JVD, no bruit, no mass felt. Heme: No neck lymph node enlargement. Cardiac: S1/S2, RRR, No murmurs, No gallops or rubs. Respiratory: No rales, wheezing, rhonchi or rubs. GI: Soft, nondistended, nontender, no rebound pain, no organomegaly, BS present. GU: No  hematuria Ext: 2+ pitting leg edema bilaterally. 1+DP/PT pulse bilaterally. Musculoskeletal: No joint deformities, No joint redness or warmth, no limitation of ROM in spin. Has tenderness and swelling in left elbow. Skin: No rashes.  Neuro: Alert, oriented X3, cranial nerves II-XII grossly intact, moves all extremities normally.  Psych: Patient is not psychotic, no suicidal or hemocidal ideation.  Labs on Admission: I have personally reviewed following labs and imaging studies  CBC: Recent Labs  Lab 02/17/20 0526  WBC 9.1  NEUTROABS 6.4  HGB 14.1  HCT 42.7  MCV 112.1*  PLT 189   Basic Metabolic Panel: Recent Labs  Lab 02/17/20 0526  NA 142  K 2.3*  CL 91*  CO2 37*  GLUCOSE 128*  BUN 14  CREATININE 1.00  CALCIUM 8.3*  MG 1.4*   GFR: Estimated  Creatinine Clearance: 83.4 mL/min (by C-G formula based on SCr of 1 mg/dL). Liver Function Tests: Recent Labs  Lab 02/17/20 0526  AST 14*  ALT 7  ALKPHOS 66  BILITOT 1.0  PROT 6.8  ALBUMIN 3.3*   No results for input(s): LIPASE, AMYLASE in the last 168 hours. No results for input(s): AMMONIA in the last 168 hours. Coagulation Profile: Recent Labs  Lab 02/17/20 0901  INR 1.0   Cardiac Enzymes: No results for input(s): CKTOTAL, CKMB, CKMBINDEX, TROPONINI in the last 168 hours. BNP (last 3 results) No results for input(s): PROBNP in the last 8760 hours. HbA1C: No results for input(s): HGBA1C in the last 72 hours. CBG: No results for input(s): GLUCAP in the last 168 hours. Lipid Profile: No results for input(s): CHOL, HDL, LDLCALC, TRIG, CHOLHDL, LDLDIRECT in the last 72 hours. Thyroid Function Tests: No results for input(s): TSH, T4TOTAL, FREET4, T3FREE, THYROIDAB in the last 72 hours. Anemia Panel: No results for input(s): VITAMINB12, FOLATE, FERRITIN, TIBC, IRON, RETICCTPCT in the last 72 hours. Urine analysis:    Component Value Date/Time   COLORURINE RED (A) 03/09/2017 1132   APPEARANCEUR TURBID (A)  03/09/2017 1132   APPEARANCEUR Hazy 11/20/2011 1343   LABSPEC 1.027 03/09/2017 1132   LABSPEC 1.026 11/20/2011 1343   PHURINE  03/09/2017 1132    TEST NOT REPORTED DUE TO COLOR INTERFERENCE OF URINE PIGMENT   GLUCOSEU (A) 03/09/2017 1132    TEST NOT REPORTED DUE TO COLOR INTERFERENCE OF URINE PIGMENT   GLUCOSEU Negative 11/20/2011 1343   HGBUR (A) 03/09/2017 1132    TEST NOT REPORTED DUE TO COLOR INTERFERENCE OF URINE PIGMENT   BILIRUBINUR (A) 03/09/2017 1132    TEST NOT REPORTED DUE TO COLOR INTERFERENCE OF URINE PIGMENT   BILIRUBINUR Negative 11/20/2011 1343   KETONESUR (A) 03/09/2017 1132    TEST NOT REPORTED DUE TO COLOR INTERFERENCE OF URINE PIGMENT   PROTEINUR (A) 03/09/2017 1132    TEST NOT REPORTED DUE TO COLOR INTERFERENCE OF URINE PIGMENT   NITRITE (A) 03/09/2017 1132    TEST NOT REPORTED DUE TO COLOR INTERFERENCE OF URINE PIGMENT   LEUKOCYTESUR (A) 03/09/2017 1132    TEST NOT REPORTED DUE TO COLOR INTERFERENCE OF URINE PIGMENT   LEUKOCYTESUR Negative 11/20/2011 1343   Sepsis Labs: @LABRCNTIP (procalcitonin:4,lacticidven:4) ) Recent Results (from the past 240 hour(s))  SARS Coronavirus 2 by RT PCR (hospital order, performed in [facility] hospital lab) Nasopharyngeal Nasopharyngeal Swab     Status: None   Collection Time: 02/17/20  7:16 AM   Specimen: Nasopharyngeal Swab  Result Value Ref Range Status   SARS Coronavirus 2 NEGATIVE NEGATIVE Final    Comment: (NOTE) SARS-CoV-2 target nucleic acids are NOT DETECTED.  The SARS-CoV-2 RNA is generally detectable in upper and lower respiratory specimens during the acute phase of infection. The lowest concentration of SARS-CoV-2 viral copies this assay can detect is 250 copies / mL. A negative result does not preclude SARS-CoV-2 infection and should not be used as the sole basis for treatment or other patient management decisions.  A negative result may occur with improper specimen collection / handling, submission of  specimen other than nasopharyngeal swab, presence of viral mutation(s) within the areas targeted by this assay, and inadequate number of viral copies (<250 copies / mL). A negative result must be combined with clinical observations, patient history, and epidemiological information.  Fact Sheet for Patients:   02/19/20  Fact Sheet for Healthcare Providers: BoilerBrush.com.cy  This test is not yet  approved or  cleared by the [place] and has been authorized for detection and/or diagnosis of SARS-CoV-2 by FDA under an Emergency Use Authorization (EUA).  This EUA will remain in effect (meaning this test can be used) for the duration of the COVID-19 declaration under Section 564(b)(1) of the Act, 21 U.S.C. section 360bbb-3(b)(1), unless the authorization is terminated or revoked sooner.  Performed at [facility], [address], [place], [place] [address]      Radiological Exams on Admission: DG Chest Portable 1 View  Result Date: 02/17/2020 CLINICAL DATA:  Congestive heart failure. Bilateral lower extremity and left arm swelling. EXAM: PORTABLE CHEST 1 VIEW COMPARISON:  Two-view chest x-ray 02/01/2018 FINDINGS: The heart is enlarged. Mild pulmonary vascular congestion is present. No focal airspace disease present. The visualized soft tissues bony thorax are unremarkable. IMPRESSION: Cardiomegaly and mild pulmonary vascular congestion without frank edema. Electronically Signed   By: Marin Roberts M.D.   On: 02/17/2020 07:15     EKG: Independently reviewed.  Atrial fibrillation, low voltage, QTC 428, LAD, poor R wave progression  Assessment/Plan Principal Problem:   Acute on chronic systolic CHF (congestive heart failure) (HCC) Active Problems:   Essential hypertension   Hyperlipidemia   CAD (coronary artery disease), native coronary artery   Hypokalemia   Hypomagnesemia   Elevated troponin   New  onset atrial fibrillation (HCC)   Olecranon bursitis of left elbow   NSVT (nonsustained ventricular tachycardia) (HCC)   Tobacco abuse   Apical mural thrombus   Gout_left elbow   Acute on chronic systolic CHF (congestive heart failure) (HCC): 2D echo on 02/02/2018 showed EF of 20--25%.  Patient has 2+ bilateral lower leg edema, elevated BNP 2028, chest x-ray showed cardiomegaly and vascular congestion, clinically consistent with CHF exacerbation.  Cardiology, Dr. Darrold Junker is consulted due to new onset of atrial fibrillation.  -Will admit to progressive unit as inpatient -Lasix 20 mg bid by IV (patient received 40 mg of Lasix in ED) -2d echo -Daily weights -strict I/O's -Low salt diet -Fluid restriction -Obtain REDs Vest reading  Essential hypertension -IV hydralazine as needed -Patient is on IV Lasix -Continue Coreg and lisinopril  Hyperlipidemia: not taking statin currently -f/U FLP  Elevated troponin and hx of AD (coronary artery disease), native coronary artery: Troponin 176.  No chest pain.  Likely due to demand ischemia - Trend Trop - Repeat EKG in the am  - prn Nitroglycerin, and aspirin - Risk factor stratification: will check FLP and A1C  - check UDS - f/u 2d echo  Hypokalemia and hypomagnesemia: k 2.3 and Mg 1.4 -Repleted both -Follow-up of BMP mag and magnesium level  New onset atrial fibrillation and NSVT (nonsustained ventricular tachycardia): HR 90s currently. Chads vasc score of 3. Per card, "Deferring Eliquis at this time as atrial fibrillation likely due to reversible cause (hypokalemia)" -cardiac monitoring -continue coreg  Apical mural thrombus: per card, "previously on Eliquis, which patient discontinued due to easy bruising. Echocardiogram in 01/2018 and 10/2018 did not reveal evidence of thrombus" -Deferring Eliquis  Gout_left elbow and possible olecranon bursitis of left elbow: ortho, Dr. Joice Lofts is consulted.  He recommended to treat patient with  steroid and continue antibiotics, pending synovial fluid culture. -will start Solu-Medrol 20 mg twice daily -IV Rocephin (patient received 1 dose of clindamycin in ED) -Follow-up synovial fluid culture and blood culture  Tobacco abuse -nicotine patch    DVT ppx: SQ Heparin   Code Status: Full code Family Communication: not done,  no family member is at bed side.   Disposition Plan:  Anticipate discharge back to previous environment Consults called:  Dr. Joice Lofts of Ortho is consulted.  Dr. Darrold Junker of cardiology is consulted.  Admission status:  progressive unit as inpt    Status is: Inpatient  Remains inpatient appropriate because:Inpatient level of care appropriate due to severity of illness.  Patient has multiple comorbidities, now presents with acute on chronic systolic CHF exacerbation, new onset atrial fibrillation, left elbow bursitis/gout, also has elevated troponin, hypokalemia, hypomagnesemia.  His presentation is highly complicated.  Patient is at high risk of deterioration.  Patient will need to be treated in hospital for at least 2 days.   Dispo: The patient is from: Home              Anticipated d/c is to: Home              Anticipated d/c date is: 2 days              Patient currently is not medically stable to d/c.           Date of Service 02/17/2020    Lorretta Harp [facility]   If 7PM-7AM, please contact night-coverage [email or web address] 02/17/2020, 11:24 AM

## 2020-02-17 NOTE — ED Triage Notes (Signed)
Patient ambulatory to triage with steady gait, without difficulty or distress noted; pt reports last several days having swelling to legs and left arm/hand and "fluid-filled" knot to elbow

## 2020-02-17 NOTE — Consult Note (Signed)
CARDIOLOGY CONSULT NOTE               Patient ID: Cameron [name] MRN: [id number] DOB/AGE: [birth year] 58 y.o.  Admit date: 02/17/2020 Referring Physician Clyde Lundborg Primary Physician no PCP per patient Primary Cardiologist [name] Reason for Consultation new onset atrial fibrillation, acute on chronic systolic CHF  HPI: 59 year old gentleman referred for evaluation of atrial fibrillation and acute on chronic systolic congestive heart failure. The patient has a history of coronary artery disease, status post stent bifurcation LAD/D1, and left circumflex 2006, status post anterior STEMI, with DES to LAD on 06/17/2016 with occluded mid left circumflex, 75% stenosis distal RCA and LVEF 25 to 35%. He also has a history of apical thrombus, previously on Eliquis, which he discontinued due to easy bruising, hyperlipidemia, and hypertension.  The patient presented to [facility] ER for evaluation of bilateral lower extremity edema without shortness of breath, orthopnea, or chest pain. He has also noticed 3-5 pound weight gain recently. He reports compliance with his Lasix 20 mg BID.  He also complained of a several day history of pain and swelling of his left elbow.  Chest x-ray revealed mild pulmonary vascular congestion without edema.  ECG revealed atrial fibrillation at a rate of 100 bpm without acute ST or T wave abnormalities. The patient has no prior history of atrial fibrillation. Admission labs notable for potassium 2.3, magnesium 1.4, high-sensitivity troponin 176, BNP 2228, uric acid elevated to 11. The patient was started on IV antibiotics for possible olecranon bursitis.   Review of systems complete and found to be negative unless listed above     Past Medical History:  Diagnosis Date   CHF (congestive heart failure) (HCC)    Coronary artery disease    a. 12/17 PCI with DES to pLAD with resdiual disease (RX therapy)   Hyperlipidemia    Hypertension    STEMI (ST elevation myocardial  infarction) (HCC) 2017    Past Surgical History:  Procedure Laterality Date   CARDIAC CATHETERIZATION N/A 06/17/2016   Procedure: Left Heart Cath and Coronary Angiography;  Surgeon: Runell Gess, MD;  Location: [facility] INVASIVE CV LAB;  Service: Cardiovascular;  Laterality: N/A;   CARDIAC CATHETERIZATION N/A 06/17/2016   Procedure: Coronary Stent Intervention;  Surgeon: Runell Gess, MD;  Location: [facility] INVASIVE CV LAB;  Service: Cardiovascular;  Laterality: N/A;   cardiac stents     CERVICAL SPINE SURGERY      (Not in a hospital admission)  Social History   Socioeconomic History   Marital status: Divorced    Spouse name: Not on file   Number of children: Not on file   Years of education: Not on file   Highest education level: Not on file  Occupational History   Not on file  Tobacco Use   Smoking status: Current Every Day Smoker    Packs/day: 1.00    Types: Cigarettes   Smokeless tobacco: Never Used  Vaping Use   Vaping Use: Never used  Substance and Sexual Activity   Alcohol use: Yes    Comment: weekends   Drug use: Yes    Frequency: 1.0 times per week    Types: Marijuana    Comment: twice monthly   Sexual activity: Not on file  Other Topics Concern   Not on file  Social History Narrative   Not on file   Social Determinants of Health   Financial Resource Strain:    Difficulty of Paying Living Expenses:   Food Insecurity:  Worried About Programme researcher, broadcasting/film/video in the Last Year:    Barista in the Last Year:   Transportation Needs:    Freight forwarder (Medical):    Lack of Transportation (Non-Medical):   Physical Activity:    Days of Exercise per Week:    Minutes of Exercise per Session:   Stress:    Feeling of Stress :   Social Connections:    Frequency of Communication with Friends and Family:    Frequency of Social Gatherings with Friends and Family:    Attends Religious Services:    Active Member of Clubs or  Organizations:    Attends Engineer, structural:    Marital Status:   Intimate Partner Violence:    Fear of Current or Ex-Partner:    Emotionally Abused:    Physically Abused:    Sexually Abused:     Family History  Problem Relation Age of Onset   CAD Paternal Grandfather    Breast cancer Mother    Parkinson's disease Father    Alzheimer's disease Father    Hypertension Father    Diabetes Father       Review of systems complete and found to be negative unless listed above      PHYSICAL EXAM  General: Well developed, well nourished, in no acute distress HEENT:  Normocephalic and atramatic Neck:  No JVD.  Lungs  Normal effort on room air, mild bibasilar crackles Heart: irregularly irregular no gallops or murmurs.  Abdomen: Bowel sounds are positive, abdomen soft and non-tender  Msk: swelling bilateral elbows Extremities: 2+ bil pitting edema.   Neuro: Alert and oriented X 3. Psych:  Good affect, responds appropriately  Labs:   Lab Results  Component Value Date   WBC 9.1 02/17/2020   HGB 14.1 02/17/2020   HCT 42.7 02/17/2020   MCV 112.1 (H) 02/17/2020   PLT 189 02/17/2020    Recent Labs  Lab 02/17/20 0526  NA 142  K 2.3*  CL 91*  CO2 37*  BUN 14  CREATININE 1.00  CALCIUM 8.3*  PROT 6.8  BILITOT 1.0  ALKPHOS 66  ALT 7  AST 14*  GLUCOSE 128*   Lab Results  Component Value Date   CKTOTAL 62 11/20/2011   CKMB < 0.5 (L) 11/20/2011   TROPONINI 0.04 (HH) 02/02/2018    Lab Results  Component Value Date   CHOL 146 02/03/2018   CHOL 144 06/19/2016   Lab Results  Component Value Date   HDL 33 (L) 02/03/2018   HDL 28 (L) 06/19/2016   Lab Results  Component Value Date   LDLCALC 71 02/03/2018   LDLCALC 82 06/19/2016   Lab Results  Component Value Date   TRIG 212 (H) 02/03/2018   TRIG 171 (H) 06/19/2016   Lab Results  Component Value Date   CHOLHDL 4.4 02/03/2018   CHOLHDL 5.1 06/19/2016   No results found for:  LDLDIRECT    Radiology: DG Chest Portable 1 View  Result Date: 02/17/2020 CLINICAL DATA:  Congestive heart failure. Bilateral lower extremity and left arm swelling. EXAM: PORTABLE CHEST 1 VIEW COMPARISON:  Two-view chest x-ray 02/01/2018 FINDINGS: The heart is enlarged. Mild pulmonary vascular congestion is present. No focal airspace disease present. The visualized soft tissues bony thorax are unremarkable. IMPRESSION: Cardiomegaly and mild pulmonary vascular congestion without frank edema. Electronically Signed   By: Marin Roberts M.D.   On: 02/17/2020 07:15    EKG: atrial fibrillation, 100 bpm  ASSESSMENT  AND PLAN:  1. Acute on chronic systolic CHF with history of ischemic cardiomyopathy with LVEF 25-30%, presenting with recent history of progressive peripheral edema and weight gain without chest pain, shortness of breath, or orthopnea. BNP >2000. Chest xray reveals mild pulmonary vascular congestion without edema. Patient reports compliance with Lasix. He was given IV Lasix 40 mg in ER and diuresed well. 2. New onset atrial fibrillation, in the setting of hypokalemia and acute on chronic systolic CHF, rate controlled. Chads vasc score of 3. 3. History of apical thrombus, previously on Eliquis, which patient discontinued due to easy bruising. Echocardiogram in 01/2018 and 10/2018 did not reveal evidence of thrombus 4. Hypokalemia, K 2.3, receiving IV potassium 5. Coronary artery disease, with history of stent bifurcation LAD/D1, and left circumflex 2006, status post anterior STEMI, with DES to LAD on 06/17/2016 with occluded mid left circumflex, 75% stenosis distal RCA. Initial high sensitivity troponin 176. Patient denies chest pain. ECG without acute ST or T wave abnormalities. 6. Hypertension, elevated this morning, though not yet on home medications.  Recommendations: 1. Deferring Eliquis at this time as atrial fibrillation likely due to reversible cause (hypokalemia), and most recent  echocardiograms without evidence of thrombus 2. 2D echocardiogram 3. Replenish potassium 4. Continue IV Lasix 20 mg BID with careful monitoring of I&Os, renal status, and K 5. Continue to trend troponin 6. Continue aspirin 81 mg, carvedilol 25 mg BID, lisinopril 20 mg daily  7. Further recommendations pending patient's initial course  Signed: Rockie Neighbours 02/17/2020, 8:47 AM

## 2020-02-17 NOTE — ED Notes (Signed)
Blood work and UDS collected by this Charity fundraiser. Pt tolerated well. Pt delivered breakfast tray at this time. Admitting MD to bedside while this RN drawing blood. Pt denies further needs. Call bell within reach at this time. Pt A&O x 4.

## 2020-02-17 NOTE — ED Notes (Signed)
This RN to bedside, 2nd IV initiated by this RN. Urinal emptied by this RN. Covid Swab obtained by this RN. Potassium and magnesium initiated by this RN at this time, potassium rate decreased to 31mL/min due to patient c/o burning at the IV site.

## 2020-02-17 NOTE — Consult Note (Signed)
ORTHOPAEDIC CONSULTATION  REQUESTING PHYSICIAN: Lorretta Harp, MD  Chief Complaint:   Posterior left elbow pain and swelling.  History of Present Illness: Cameron [name] is a 59 y.o. male with multiple medical issues, including coronary artery disease status post an MI with subsequent stent placement, congestive heart failure with an ejection fraction of approximately 20%, hypertension, and hyperlipidemia who presented to the emergency room last night complaining of bilateral lower extremity swelling.  Subsequent work-up confirmed the presence of an exacerbation of his congestive heart failure as well as some new onset atrial fibrillation.  The patient also complained of a several day history of progressive pain and swelling in the posterior aspect of his left elbow.  He denies any injury to the elbow, and denies any fevers or chills.  He also denies any numbness or paresthesias down his arm to his hand.  The patient does have a history of gout, but notes that the gout symptoms previously had only affected his toes and knee.  He does not take any medications on a regular basis for his gout, and presently does not have a primary care provider.  Past Medical History:  Diagnosis Date  . CHF (congestive heart failure) (HCC)   . Coronary artery disease    a. 12/17 PCI with DES to pLAD with resdiual disease (RX therapy)  . Hyperlipidemia   . Hypertension   . STEMI (ST elevation myocardial infarction) (HCC) 2017   Past Surgical History:  Procedure Laterality Date  . CARDIAC CATHETERIZATION N/A 06/17/2016   Procedure: Left Heart Cath and Coronary Angiography;  Surgeon: Runell Gess, MD;  Location: [facility] INVASIVE CV LAB;  Service: Cardiovascular;  Laterality: N/A;  . CARDIAC CATHETERIZATION N/A 06/17/2016   Procedure: Coronary Stent Intervention;  Surgeon: Runell Gess, MD;  Location: [facility] INVASIVE CV LAB;  Service: Cardiovascular;   Laterality: N/A;  . cardiac stents    . CERVICAL SPINE SURGERY     Social History   Socioeconomic History  . Marital status: Divorced    Spouse name: Not on file  . Number of children: Not on file  . Years of education: Not on file  . Highest education level: Not on file  Occupational History  . Not on file  Tobacco Use  . Smoking status: Current Every Day Smoker    Packs/day: 1.00    Types: Cigarettes  . Smokeless tobacco: Never Used  Vaping Use  . Vaping Use: Never used  Substance and Sexual Activity  . Alcohol use: Yes    Comment: weekends  . Drug use: Yes    Frequency: 1.0 times per week    Types: Marijuana    Comment: twice monthly  . Sexual activity: Not on file  Other Topics Concern  . Not on file  Social History Narrative  . Not on file   Social Determinants of Health   Financial Resource Strain:   . Difficulty of Paying Living Expenses:   Food Insecurity:   . Worried About Programme researcher, broadcasting/film/video in the Last Year:   . Barista in the Last Year:   Transportation Needs:   . Freight forwarder (Medical):   Marland Kitchen Lack of Transportation (Non-Medical):   Physical Activity:   . Days of Exercise per Week:   . Minutes of Exercise per Session:   Stress:   . Feeling of Stress :   Social Connections:   . Frequency of Communication with Friends and Family:   . Frequency of Social  Gatherings with Friends and Family:   . Attends Religious Services:   . Active Member of Clubs or Organizations:   . Attends Banker Meetings:   Marland Kitchen Marital Status:    Family History  Problem Relation Age of Onset  . CAD Paternal Grandfather   . Breast cancer Mother   . Parkinson's disease Father   . Alzheimer's disease Father   . Hypertension Father   . Diabetes Father    No Known Allergies Prior to Admission medications   Medication Sig Start Date End Date Taking? Authorizing Provider  aspirin 81 MG EC tablet Take 1 tablet (81 mg total) by mouth daily. 02/04/18    [name], MD  atorvastatin (LIPITOR) 80 MG tablet Take 80 mg by mouth daily. 08/13/18   [provider]  carvedilol (COREG) 25 MG tablet Take 25 mg by mouth 2 (two) times daily with a meal. 12/06/19   [provider]  furosemide (LASIX) 20 MG tablet TAKE 1 TABLET BY MOUTH EVERY DAY AS NEEDED Patient taking differently: Take 20 mg by mouth 2 (two) times daily.  12/14/17   Runell Gess, MD  lisinopril (PRINIVIL,ZESTRIL) 20 MG tablet Take 20 mg by mouth daily. 11/09/17   [provider]  nitroGLYCERIN (NITROSTAT) 0.4 MG SL tablet Place 1 tablet (0.4 mg total) under the tongue every 5 (five) minutes as needed. 06/20/16   Arty Baumgartner, NP  VENTOLIN HFA 108 (90 Base) MCG/ACT inhaler Inhale 2 puffs into the lungs every 6 (six) hours as needed for wheezing or shortness of breath. 09/27/19   [provider]  isosorbide mononitrate (IMDUR) 60 MG 24 hr tablet Take 1 tablet (60 mg total) by mouth daily. 02/04/18 05/30/19  [name], MD   DG Chest Portable 1 View  Result Date: 02/17/2020 CLINICAL DATA:  Congestive heart failure. Bilateral lower extremity and left arm swelling. EXAM: PORTABLE CHEST 1 VIEW COMPARISON:  Two-view chest x-ray 02/01/2018 FINDINGS: The heart is enlarged. Mild pulmonary vascular congestion is present. No focal airspace disease present. The visualized soft tissues bony thorax are unremarkable. IMPRESSION: Cardiomegaly and mild pulmonary vascular congestion without frank edema. Electronically Signed   By: Marin Roberts M.D.   On: 02/17/2020 07:15    Positive ROS: All other systems have been reviewed and were otherwise negative with the exception of those mentioned in the HPI and as above.  Physical Exam: General:  Alert, no acute distress Psychiatric:  Patient is competent for consent with normal mood and affect   Cardiovascular:  No pedal edema Respiratory:  No wheezing, non-labored breathing GI:  Abdomen is soft and non-tender Skin:   No lesions in the area of chief complaint Neurologic:  Sensation intact distally Lymphatic:  No axillary or cervical lymphadenopathy  Orthopedic Exam:  Orthopedic examination is limited to the left elbow and upper extremity.  Skin inspection is notable for an area of swelling and erythema over the posterior aspect of the elbow, consistent with olecranon bursitis.  The erythema extends into the posterior forearm.  There is no evidence of elbow effusion.  He has tenderness to palpation over the posterior elbow itself, but has no tenderness medially, laterally, or anteriorly.  Actively, he is able to extend his elbow to within 15 degrees and flex to 120 degrees without any pain.  He is neurovascularly intact to the left forearm and hand.  X-rays:  No x-rays of the left elbow are available to review.  Assessment: Olecranon bursitis, probably secondary to gout  versus infection.  Plan: The treatment options have been discussed with the patient.  Based on his examination, the inflammatory process appears to be limited to the olecranon bursa and does not involve the elbow joint.  Given that the patient is afebrile, has a normal white count, exhibits no left shift, and has an elevated uric acid level, I feel that the etiology most likely is gout versus infection.  The ER provider has already aspirated the left olecranon bursa, so I do feel it is reasonable to continue the IV antibiotics until the results of the culture are known.  In addition, I feel it is reasonable to initiate a short course of steroids, either p.o. or IV, to help with his elbow symptoms.  I will leave the dosage to the hospitalist, given his other multiple medical issues.  At this point, I do not feel that surgical intervention will be necessary, especially given his more pressing medical issues.  Thank you for asking me to participate in the care of this most pleasant yet unfortunate man.  I will be happy to follow him with you.   Maryagnes Amos, MD  Beeper #:  [phone]  02/17/2020 8:42 AM

## 2020-02-17 NOTE — ED Provider Notes (Addendum)
[facility] Emergency Department Provider Note  ____________________________________________  Time seen: Approximately 6:04 AM  I have reviewed the triage vital signs and the nursing notes.   HISTORY  Chief Complaint Leg Swelling   HPI [name] is a 59 y.o. male with a history of CHF with a EF of 20 to 25%, CAD status post stent, hypertension, hyperlipidemia  who presents for evaluation of bilateral lower extremity swelling.  Patient reports compliance with his Lasix.  He takes 20 mg twice daily.  He reports that he usually urinates a lot but over the last few days he has not urinated as much after taking the Lasix.  He has noticed progression of the swelling of his lower extremities over the last few days.  No shortness of breath or chest pain.  He reports that he works third shift and stands for several hours at nighttime.  Is also complaining of pain and swelling of his left elbow which she has had for several days.  The swelling is getting progressively worse.  The pain is moderate and sharp.  No pain with movement of the elbow.  No trauma.  Patient reports that he is supposed to be on Eliquis and Plavix but stopped taking both of them because he used to bruise up very easily.  Past Medical History:  Diagnosis Date  . CHF (congestive heart failure) (HCC)   . Coronary artery disease    a. 12/17 PCI with DES to pLAD with resdiual disease (RX therapy)  . Hyperlipidemia   . Hypertension   . STEMI (ST elevation myocardial infarction) [facility]) 2017    Patient Active Problem List   Diagnosis Date Noted  . Chest pain 02/02/2018  . Essential hypertension 06/20/2016  . Hyperlipidemia 06/20/2016  . CAD (coronary artery disease), native coronary artery 06/20/2016  . STEMI involving oth coronary artery of anterior wall (HCC) 06/17/2016  . STEMI (ST elevation myocardial infarction) (HCC) 06/17/2016    Past Surgical History:  Procedure Laterality Date  .  CARDIAC CATHETERIZATION N/A 06/17/2016   Procedure: Left Heart Cath and Coronary Angiography;  Surgeon: Runell Gess, MD;  Location: [facility] INVASIVE CV LAB;  Service: Cardiovascular;  Laterality: N/A;  . CARDIAC CATHETERIZATION N/A 06/17/2016   Procedure: Coronary Stent Intervention;  Surgeon: Runell Gess, MD;  Location: [facility] INVASIVE CV LAB;  Service: Cardiovascular;  Laterality: N/A;  . cardiac stents    . CERVICAL SPINE SURGERY      Prior to Admission medications   Medication Sig Start Date End Date Taking? Authorizing Provider  aspirin 81 MG EC tablet Take 1 tablet (81 mg total) by mouth daily. 02/04/18   [name], MD  atorvastatin (LIPITOR) 80 MG tablet Take 80 mg by mouth daily. 08/13/18   [provider]  carvedilol (COREG) 6.25 MG tablet Take 1 tablet (6.25 mg total) by mouth 2 (two) times daily with a meal. 06/20/16   Laverda Page B, NP  furosemide (LASIX) 20 MG tablet TAKE 1 TABLET BY MOUTH EVERY DAY AS NEEDED 12/14/17   Runell Gess, MD  lisinopril (PRINIVIL,ZESTRIL) 20 MG tablet Take 20 mg by mouth daily. 11/09/17   [provider]  nitroGLYCERIN (NITROSTAT) 0.4 MG SL tablet Place 1 tablet (0.4 mg total) under the tongue every 5 (five) minutes as needed. 06/20/16   Arty Baumgartner, NP  oxyCODONE-acetaminophen (PERCOCET/ROXICET) 5-325 MG tablet Take 1 tablet by mouth every 6 (six) hours as needed for severe pain. Do not drive while  taking as can cause drowsiness. 05/30/19   Renford Dills, NP  predniSONE (DELTASONE) 10 MG tablet Start 60 mg po day one, then 50 mg po day two, taper by 10 mg daily until complete. 05/30/19   Renford Dills, NP  isosorbide mononitrate (IMDUR) 60 MG 24 hr tablet Take 1 tablet (60 mg total) by mouth daily. 02/04/18 05/30/19  [name], MD    Allergies Patient has no known allergies.  Family History  Problem Relation Age of Onset  . CAD Paternal Grandfather   . Breast cancer Mother   . Parkinson's disease Father   .  Alzheimer's disease Father   . Hypertension Father   . Diabetes Father     Social History Social History   Tobacco Use  . Smoking status: Current Every Day Smoker    Packs/day: 1.00    Types: Cigarettes  . Smokeless tobacco: Never Used  Vaping Use  . Vaping Use: Never used  Substance Use Topics  . Alcohol use: Yes    Comment: weekends  . Drug use: Yes    Frequency: 1.0 times per week    Types: Marijuana    Comment: twice monthly    Review of Systems  Constitutional: Negative for fever. Eyes: Negative for visual changes. ENT: Negative for sore throat. Neck: No neck pain  Cardiovascular: Negative for chest pain. Respiratory: Negative for shortness of breath. Gastrointestinal: Negative for abdominal pain, vomiting or diarrhea. Genitourinary: Negative for dysuria. Musculoskeletal: Negative for back pain. + b/l leg swelling and L elbow pain Skin: Negative for rash. Neurological: Negative for headaches, weakness or numbness. Psych: No SI or HI  ____________________________________________   PHYSICAL EXAM:  VITAL SIGNS: ED Triage Vitals  Enc Vitals Group     BP 02/17/20 0521 (!) 149/94     Pulse Rate 02/17/20 0521 95     Resp 02/17/20 0521 18     Temp 02/17/20 0521 98.7 F (37.1 C)     Temp Source 02/17/20 0521 Oral     SpO2 02/17/20 0521 97 %     Weight 02/17/20 0529 185 lb (83.9 kg)     Height 02/17/20 0529  (1.702 m)     Head Circumference --      Peak Flow --      Pain Score 02/17/20 0529 5     Pain Loc --      Pain Edu? --      Excl. in GC? --     Constitutional: Alert and oriented. Well appearing and in no apparent distress. HEENT:      Head: Normocephalic and atraumatic.         Eyes: Conjunctivae are normal. Sclera is non-icteric.       Mouth/Throat: Mucous membranes are moist.       Neck: Supple with no signs of meningismus. Cardiovascular: Irregularly irregular rhythm with normal rate, no murmurs. Respiratory: Normal respiratory effort.  Lungs are clear to auscultation bilaterally. No wheezes, crackles, or rhonchi.  Gastrointestinal: Soft, non tender. Musculoskeletal: 2+ pitting edema bilateral lower extremity Neurologic: Normal speech and language. Face is symmetric. Moving all extremities. No gross focal neurologic deficits are appreciated. Skin: Skin is warm, dry and intact. No rash noted. Psychiatric: Mood and affect are normal. Speech and behavior are normal.  ____________________________________________   LABS (all labs ordered are listed, but only abnormal results are displayed)  Labs Reviewed  CBC WITH DIFFERENTIAL/PLATELET - Abnormal; Notable for the following components:      Result Value  RBC 3.81 (*)    MCV 112.1 (*)    MCH 37.0 (*)    RDW 15.8 (*)    All other components within normal limits  COMPREHENSIVE METABOLIC PANEL - Abnormal; Notable for the following components:   Potassium 2.3 (*)    Chloride 91 (*)    CO2 37 (*)    Glucose, Bld 128 (*)    Calcium 8.3 (*)    Albumin 3.3 (*)    AST 14 (*)    All other components within normal limits  URIC ACID - Abnormal; Notable for the following components:   Uric Acid, Serum 11.0 (*)    All other components within normal limits  BRAIN NATRIURETIC PEPTIDE - Abnormal; Notable for the following components:   B Natriuretic Peptide 2,228.8 (*)    All other components within normal limits  MAGNESIUM - Abnormal; Notable for the following components:   Magnesium 1.4 (*)    All other components within normal limits  TROPONIN I (HIGH SENSITIVITY) - Abnormal; Notable for the following components:   Troponin I (High Sensitivity) 176 (*)    All other components within normal limits  BODY FLUID CULTURE  SARS CORONAVIRUS 2 BY RT PCR (HOSPITAL ORDER, PERFORMED IN [facility])  SYNOVIAL CELL COUNT + DIFF, W/ CRYSTALS   ____________________________________________  EKG  ED ECG REPORT I, Nita Sickle, the attending physician, personally  viewed and interpreted this ECG.   Atrial fibrillation, rate of 100, normal QTC, left axis deviation, no ST elevation.  A. fib is new when compared to prior. ____________________________________________  RADIOLOGY  I have personally reviewed the images performed during this visit and I agree with the Radiologist's read.   Interpretation by Radiologist:  DG Chest Portable 1 View  Result Date: 02/17/2020 CLINICAL DATA:  Congestive heart failure. Bilateral lower extremity and left arm swelling. EXAM: PORTABLE CHEST 1 VIEW COMPARISON:  Two-view chest x-ray 02/01/2018 FINDINGS: The heart is enlarged. Mild pulmonary vascular congestion is present. No focal airspace disease present. The visualized soft tissues bony thorax are unremarkable. IMPRESSION: Cardiomegaly and mild pulmonary vascular congestion without frank edema. Electronically Signed   By: Marin Roberts M.D.   On: 02/17/2020 07:15      ____________________________________________   PROCEDURES  Procedure(s) performed:yes .1-3 Lead EKG Interpretation Performed by: Nita Sickle, MD Authorized by: Nita Sickle, MD     Interpretation: abnormal     ECG rate assessment: tachycardic     Rhythm: atrial fibrillation     Ectopy: none    .Marland KitchenIncision and Drainage  Date/Time: 02/17/2020 7:12 AM Performed by: Nita Sickle, MD Authorized by: Nita Sickle, MD   Consent:    Consent obtained:  Verbal   Consent given by:  Patient   Risks discussed:  Bleeding, infection, incomplete drainage and pain   Alternatives discussed:  Alternative treatment, delayed treatment and observation Location:    Type:  Bursa   Location:  Upper extremity   Upper extremity location:  Elbow   Elbow location:  L elbow Pre-procedure details:    Skin preparation:  Betadine Anesthesia (see MAR for exact dosages):    Anesthesia method:  Topical application   Topical anesthetic:  EMLA cream Procedure type:    Complexity:   Simple Procedure details:    Needle aspiration: yes     Drainage:  Purulent   Drainage amount:  Moderate Post-procedure details:    Patient tolerance of procedure:  Tolerated well, no immediate complications   Critical Care performed: yes  CRITICAL CARE Performed by: Nita Sickle  ?  Total critical care time: 45 min  Critical care time was exclusive of separately billable procedures and treating other patients.  Critical care was necessary to treat or prevent imminent or life-threatening deterioration.  Critical care was time spent personally by me on the following activities: development of treatment plan with patient and/or surrogate as well as nursing, discussions with consultants, evaluation of patient's response to treatment, examination of patient, obtaining history from patient or surrogate, ordering and performing treatments and interventions, ordering and review of laboratory studies, ordering and review of radiographic studies, pulse oximetry and re-evaluation of patient's condition.  ____________________________________________   INITIAL IMPRESSION / ASSESSMENT AND PLAN / ED COURSE  59 y.o. male with a history of CHF with a EF of 20 to 25%, CAD status post stent, hypertension, hyperlipidemia  who presents for evaluation of bilateral lower extremity swelling and swelling of the left elbow.  Patient looks volume overloaded with 2+ pitting edema.  Will check basic labs to evaluate patient's kidney function.  We will give him a dose of 40 mg of IV Lasix.  We will get a chest x-ray to rule out pulmonary edema.  Patient also found to be in A. fib with well-controlled rate.  He has no prior history of A. fib.  Patient is supposed to be on Eliquis but stopped on his own due to easy bruising.  Will recommend the patient be restarted on Eliquis.  Patient has had a couple of nonsustained runs of V. tach up to three beats.  He has been asymptomatic.  Will check electrolytes.  He is  also complaining of left elbow swelling which is consistent with olecranon bursitis.  We will send bursa fluid for crystal and culture analysis.  The swelling seems to be extending from the elbow down to the arm therefore will start patient on antibiotics.  Patient placed on telemetry for close monitoring.  Old medical records reviewed.  _________________________ 7:13 AM on 02/17/2020 -----------------------------------------  Kateri Mc fluid purulent concerning for septic bursitis.  Patient has full painless range of motion of the elbow with no concerns for a septic joint at this time.  Patient started on IV Clinda.  Labs showing hypokalemia with K of 2.3.  Will supplement p.o. and IV, will also supplement IV magnesium.  Most likely the reason why patient is going into A. fib and couple of runs of unsustained V. tach.  We will continue to monitor closely on telemetry.  BNP severely elevated at 2228.  Patient is diuresing well with Lasix.  Troponin elevated at 176.  Most likely demand ischemia.  Patient has no chest pain or shortness of breath.  Will discuss with hospitalist for admission.    _____________________________________________ Please note:  Patient was evaluated in Emergency Department today for the symptoms described in the history of present illness. Patient was evaluated in the context of the global COVID-19 pandemic, which necessitated consideration that the patient might be at risk for infection with the SARS-CoV-2 virus that causes COVID-19. Institutional protocols and algorithms that pertain to the evaluation of patients at risk for COVID-19 are in a state of rapid change based on information released by regulatory bodies including the CDC and federal and state organizations. These policies and algorithms were followed during the patient's care in the ED.  Some ED evaluations and interventions may be delayed as a result of limited staffing during the pandemic.   [place] Controlled Substance  Database was reviewed by me.  ____________________________________________   FINAL CLINICAL IMPRESSION(S) / ED DIAGNOSES   Final diagnoses:  Acute on chronic congestive heart failure, unspecified heart failure type (HCC)  Hypokalemia  Ventricular tachycardia (HCC)  Atrial fibrillation, unspecified type (HCC)  Septic bursitis of elbow, left      NEW MEDICATIONS STARTED DURING THIS VISIT:  ED Discharge Orders    None       Note:  This document was prepared using Dragon voice recognition software and may include unintentional dictation errors.    Don Perking, [place], MD 02/17/20 0715    Nita Sickle, MD 02/17/20 [phone]

## 2020-02-17 NOTE — Progress Notes (Signed)
ReDs vest reading 44

## 2020-02-17 NOTE — ED Notes (Signed)
Medications administered per order. Pt tolerated well. Pt repositioned in bed per his request. Pt O2 noted to be 88% on RA. This RN placed patient on 2L via [place] at this time. Pt's O2 noted to be 100% on 2L via [place].

## 2020-02-18 DIAGNOSIS — M109 Gout, unspecified: Secondary | ICD-10-CM

## 2020-02-18 DIAGNOSIS — I4891 Unspecified atrial fibrillation: Secondary | ICD-10-CM

## 2020-02-18 DIAGNOSIS — E876 Hypokalemia: Secondary | ICD-10-CM

## 2020-02-18 LAB — HEMOGLOBIN A1C
Hgb A1c MFr Bld: 5.4 % (ref 4.8–5.6)
Mean Plasma Glucose: 108.28 mg/dL

## 2020-02-18 LAB — CBC
HCT: 43.5 % (ref 39.0–52.0)
Hemoglobin: 14.4 g/dL (ref 13.0–17.0)
MCH: 37.4 pg — ABNORMAL HIGH (ref 26.0–34.0)
MCHC: 33.1 g/dL (ref 30.0–36.0)
MCV: 113 fL — ABNORMAL HIGH (ref 80.0–100.0)
Platelets: 190 10*3/uL (ref 150–400)
RBC: 3.85 MIL/uL — ABNORMAL LOW (ref 4.22–5.81)
RDW: 15.9 % — ABNORMAL HIGH (ref 11.5–15.5)
WBC: 9.3 10*3/uL (ref 4.0–10.5)
nRBC: 0 % (ref 0.0–0.2)

## 2020-02-18 LAB — BASIC METABOLIC PANEL
Anion gap: 12 (ref 5–15)
BUN: 20 mg/dL (ref 6–20)
CO2: 38 mmol/L — ABNORMAL HIGH (ref 22–32)
Calcium: 8.5 mg/dL — ABNORMAL LOW (ref 8.9–10.3)
Chloride: 92 mmol/L — ABNORMAL LOW (ref 98–111)
Creatinine, Ser: 0.91 mg/dL (ref 0.61–1.24)
GFR calc Af Amer: >60 mL/min (ref >60)
GFR calc non Af Amer: >60 mL/min (ref >60)
Glucose, Bld: 140 mg/dL — ABNORMAL HIGH (ref 70–99)
Potassium: 2.7 mmol/L — CL (ref 3.5–5.1)
Sodium: 142 mmol/L (ref 135–145)

## 2020-02-18 LAB — LIPID PANEL
Cholesterol: 108 mg/dL (ref 0–200)
HDL: 39 mg/dL — ABNORMAL LOW (ref >40)
LDL Cholesterol: 52 mg/dL (ref 0–99)
Total CHOL/HDL Ratio: 2.8 RATIO
Triglycerides: 87 mg/dL (ref <150)
VLDL: 17 mg/dL (ref 0–40)

## 2020-02-18 LAB — ECHOCARDIOGRAM COMPLETE
Height: 67 in
MV M vel: 5.09 m/s
MV Peak grad: 103.6 mmHg
S' Lateral: 4.83 cm
Single Plane A4C EF: 20 %
Weight: 3128 oz

## 2020-02-18 LAB — TSH: TSH: 0.479 u[IU]/mL (ref 0.350–4.500)

## 2020-02-18 LAB — MAGNESIUM: Magnesium: 1.8 mg/dL (ref 1.7–2.4)

## 2020-02-18 MED ORDER — APIXABAN 5 MG PO TABS
5.0000 mg | ORAL_TABLET | Freq: Two times a day (BID) | ORAL | Status: DC
  Administered 2020-02-18 – 2020-02-21 (×7): 5 mg via ORAL
  Filled 2020-02-18 (×7): qty 1

## 2020-02-18 MED ORDER — POTASSIUM CHLORIDE CRYS ER 20 MEQ PO TBCR
40.0000 meq | EXTENDED_RELEASE_TABLET | Freq: Two times a day (BID) | ORAL | Status: AC
  Administered 2020-02-18: 40 meq via ORAL
  Filled 2020-02-18 (×2): qty 2

## 2020-02-18 NOTE — Progress Notes (Signed)
Subjective: The patient no significant improvement in his left elbow and upper extremity symptoms as compared to yesterday.  He feels that all of the swelling, pain, and erythema is out of his forearm and elbow as compared to yesterday.  He has no new complaints pertaining to his left elbow and upper extremity today.  Objective: Vital signs in last 24 hours: Temp:  [97.7 F (36.5 C)-98.3 F (36.8 C)] 98 F (36.7 C) (08/14 1202) Pulse Rate:  [78-99] 82 (08/14 1202) Resp:  [17-18] 18 (08/14 0336) BP: (102-153)/(82-114) 121/99 (08/14 1202) SpO2:  [90 %-94 %] 91 % (08/14 1202) Weight:  [88.7 kg-89.4 kg] 89.4 kg (08/14 0332)  Intake/Output from previous day: 08/13 0701 - 08/14 0700 In: 1385.4 [P.O.:1080; IV Piggyback:305.4] Out: 1675 [Urine:1675] Intake/Output this shift: Total I/O In: 360 [P.O.:360] Out: 425 [Urine:425]  Recent Labs    02/17/20 0526 02/18/20 0811  HGB 14.1 14.4   Recent Labs    02/17/20 0526 02/18/20 0811  WBC 9.1 9.3  RBC 3.81* 3.85*  HCT 42.7 43.5  PLT 189 190   Recent Labs    02/17/20 0526 02/18/20 0811  NA 142 142  K 2.3* 2.7*  CL 91* 92*  CO2 37* 38*  BUN 14 20  CREATININE 1.00 0.91  GLUCOSE 128* 140*  CALCIUM 8.3* 8.5*   Recent Labs    02/17/20 0901  INR 1.0    Physical Exam: Orthopedic examination is limited to the left elbow region.  There is still some fluid in the posterior aspect of the elbow, consistent with some residual olecranon bursitis.  However, this area is nontender to palpation and the erythema and swelling noted yesterday has resolved.  He exhibits near full active and passive range of motion of the elbow without any discomfort.  He is neurovascularly intact to the left upper extremity and hand.  Assessment: Left olecranon bursitis presumably due to gout, improved symptomatically.  Plan: The patient is responding well to the IV antibiotics and and IV steroids.  The culture from the left olecranon bursa aspiration from  yesterday morning is not yet back, but preliminary results show no growth and no bacteria were observed on the Gram stain.  Therefore, it is reasonable to consider discontinuing the IV antibiotics at this time if this is felt to be appropriate clinically.  The patient may be mobilized if stable from a medical standpoint.   Cameron [name] Cameron [name] 02/18/2020, 12:26 PM

## 2020-02-18 NOTE — Progress Notes (Signed)
PROGRESS NOTE    Cameron [name]  MAU:[id number] DOB: 07-Mar-1961 DOA: 02/17/2020 PCP: Patient, No Pcp Per    Assessment & Plan:   Principal Problem:   Acute on chronic systolic CHF (congestive heart failure) (HCC) Active Problems:   Essential hypertension   Hyperlipidemia   CAD (coronary artery disease), native coronary artery   Hypokalemia   Hypomagnesemia   Elevated troponin   New onset atrial fibrillation (HCC)   Olecranon bursitis of left elbow   NSVT (nonsustained ventricular tachycardia) (HCC)   Tobacco abuse   Apical mural thrombus   Gout_left elbow   Acute on chronic systolic CHF:  echo shows EF 20-25%. Continue on IV lasix. Monitor I/Os & daily weights. Cardio following and recs apprec   A. fib: new onset. CHADsVASc score of 3,will need anticoagulation. Started back on eliquis. Continue on coreg.  Gout: of left elbow and possible olecranon bursitis of left elbow. Continue on IV solumedrol & rocephin.  Synovial fluid cx NGTD. Ortho surg following and recs apprec  Apical mural thrombus: per cardio "previously on Eliquis, which patient discontinued due to easy bruising. Echocardiogram in 01/2018 and 10/2018 did not reveal evidence of thrombus".  HTN: continue on coreg, lisinopril. IV hydralazine prn   Hyperlipidemia: not taking a statin at home but started on a statin while in the hospital   Elevated troponin: likely secondary to demand ischemia. Echo pending. Hx of CAD.   Hypokalemia: KCl repleted. Will continue to monitor   Hypomagnesemia:  WNL today. Will continue to monitor   Tobacco abuse: nicotine patch to prevent w/drawal. Smoking cessation counseling    DVT prophylaxis: eliquis Code Status: full Family Communication:  Disposition Plan: likely d/c back home   Consultants:   Cardio  Ortho surg    Procedures:    Antimicrobials: rocephin   Subjective: Pt c/o left elbow pain   Objective: Vitals:   02/17/20 2009 02/18/20 0332 02/18/20  0336 02/18/20 0740  BP: 115/87  (!) 153/114 102/88  Pulse: 94  83 78  Resp: 18  18   Temp: 97.8 F (36.6 C)  97.8 F (36.6 C) 97.8 F (36.6 C)  TempSrc: Oral   Oral  SpO2: 90%  94% 90%  Weight:  89.4 kg    Height:        Intake/Output Summary (Last 24 hours) at 02/18/2020 1147 Last data filed at 02/18/2020 0950 Gross per 24 hour  Intake 1440 ml  Output 700 ml  Net 740 ml   Filed Weights   02/17/20 0529 02/17/20 1332 02/18/20 0332  Weight: 83.9 kg 88.7 kg 89.4 kg    Examination:  General exam: Appears calm and comfortable  Respiratory system: diminished breath sounds b/l. No wheezes Cardiovascular system: S1 & S2 +. No rubs, gallops or clicks.  Gastrointestinal system: Abdomen is obese, soft and nontender.Normal bowel sounds heard. Central nervous system: Alert and oriented. Moves all 4 extremities Psychiatry: Judgement and insight appear normal. Mood & affect appropriate.     Data Reviewed: I have personally reviewed following labs and imaging studies  CBC: Recent Labs  Lab 02/17/20 0526 02/18/20 0811  WBC 9.1 9.3  NEUTROABS 6.4  --   HGB 14.1 14.4  HCT 42.7 43.5  MCV 112.1* 113.0*  PLT 189 190   Basic Metabolic Panel: Recent Labs  Lab 02/17/20 0526 02/18/20 0811  NA 142 142  K 2.3* 2.7*  CL 91* 92*  CO2 37* 38*  GLUCOSE 128* 140*  BUN 14 20  CREATININE 1.00  0.91  CALCIUM 8.3* 8.5*  MG 1.4* 1.8   GFR: Estimated Creatinine Clearance: 94.4 mL/min (by C-G formula based on SCr of 0.91 mg/dL). Liver Function Tests: Recent Labs  Lab 02/17/20 0526  AST 14*  ALT 7  ALKPHOS 66  BILITOT 1.0  PROT 6.8  ALBUMIN 3.3*   No results for input(s): LIPASE, AMYLASE in the last 168 hours. No results for input(s): AMMONIA in the last 168 hours. Coagulation Profile: Recent Labs  Lab 02/17/20 0901  INR 1.0   Cardiac Enzymes: No results for input(s): CKTOTAL, CKMB, CKMBINDEX, TROPONINI in the last 168 hours. BNP (last 3 results) No results for input(s):  PROBNP in the last 8760 hours. HbA1C: No results for input(s): HGBA1C in the last 72 hours. CBG: No results for input(s): GLUCAP in the last 168 hours. Lipid Profile: Recent Labs    02/18/20 0811  CHOL 108  HDL 39*  LDLCALC 52  TRIG 87  CHOLHDL 2.8   Thyroid Function Tests: Recent Labs    02/18/20 0811  TSH 0.479   Anemia Panel: No results for input(s): VITAMINB12, FOLATE, FERRITIN, TIBC, IRON, RETICCTPCT in the last 72 hours. Sepsis Labs: No results for input(s): PROCALCITON, LATICACIDVEN in the last 168 hours.  Recent Results (from the past 240 hour(s))  Body fluid culture     Status: None (Preliminary result)   Collection Time: 02/17/20  6:53 AM   Specimen: Synovium; Body Fluid  Result Value Ref Range Status   Specimen Description   Final    SYNOVIAL ELBOW Performed at [facility], [address] [place], [place] [address]    Special Requests   Final    NONE Performed at [facility], [address] [place], [place] [address]    Gram Stain NO WBC SEEN NO ORGANISMS SEEN   Final   Culture   Final    NO GROWTH < 24 HOURS Performed at [facility], [address], [place], [place] [address]    Report Status PENDING  Incomplete  SARS Coronavirus 2 by RT PCR (hospital order, performed in [facility] hospital lab) Nasopharyngeal Nasopharyngeal Swab     Status: None   Collection Time: 02/17/20  7:16 AM   Specimen: Nasopharyngeal Swab  Result Value Ref Range Status   SARS Coronavirus 2 NEGATIVE NEGATIVE Final    Comment: (NOTE) SARS-CoV-2 target nucleic acids are NOT DETECTED.  The SARS-CoV-2 RNA is generally detectable in upper and lower respiratory specimens during the acute phase of infection. The lowest concentration of SARS-CoV-2 viral copies this assay can detect is 250 copies / mL. A negative result does not preclude SARS-CoV-2 infection and should not be used as the sole basis for treatment or other patient management  decisions.  A negative result may occur with improper specimen collection / handling, submission of specimen other than nasopharyngeal swab, presence of viral mutation(s) within the areas targeted by this assay, and inadequate number of viral copies (<250 copies / mL). A negative result must be combined with clinical observations, patient history, and epidemiological information.  Fact Sheet for Patients:   BoilerBrush.com.cy  Fact Sheet for Healthcare Providers: https://pope.com/  This test is not yet approved or  cleared by the [place] FDA and has been authorized for detection and/or diagnosis of SARS-CoV-2 by FDA under an Emergency Use Authorization (EUA).  This EUA will remain in effect (meaning this test can be used) for the duration of the COVID-19 declaration under Section 564(b)(1) of the Act,  21 U.S.C. section 360bbb-3(b)(1), unless the authorization is terminated or revoked sooner.  Performed at [facility], [address] [place], [place] [address]   Culture, blood (Routine X 2) w Reflex to ID Panel     Status: None (Preliminary result)   Collection Time: 02/17/20  9:01 AM   Specimen: BLOOD  Result Value Ref Range Status   Specimen Description BLOOD BLOOD LEFT FOREARM  Final   Special Requests   Final    BOTTLES DRAWN AEROBIC AND ANAEROBIC Blood Culture adequate volume   Culture   Final    NO GROWTH < 24 HOURS Performed at [facility], [address] [place], [place] [address]    Report Status PENDING  Incomplete  Culture, blood (Routine X 2) w Reflex to ID Panel     Status: None (Preliminary result)   Collection Time: 02/17/20  9:01 AM   Specimen: BLOOD  Result Value Ref Range Status   Specimen Description BLOOD BLOOD RIGHT FOREARM  Final   Special Requests   Final    BOTTLES DRAWN AEROBIC AND ANAEROBIC Blood Culture adequate volume   Culture   Final    NO GROWTH < 24 HOURS Performed  at [facility], [address] [place], [place] [address]    Report Status PENDING  Incomplete         Radiology Studies: DG Chest Portable 1 View  Result Date: 02/17/2020 CLINICAL DATA:  Congestive heart failure. Bilateral lower extremity and left arm swelling. EXAM: PORTABLE CHEST 1 VIEW COMPARISON:  Two-view chest x-ray 02/01/2018 FINDINGS: The heart is enlarged. Mild pulmonary vascular congestion is present. No focal airspace disease present. The visualized soft tissues bony thorax are unremarkable. IMPRESSION: Cardiomegaly and mild pulmonary vascular congestion without frank edema. Electronically Signed   By: Marin Roberts M.D.   On: 02/17/2020 07:15        Scheduled Meds: . apixaban  5 mg Oral BID  . aspirin EC  81 mg Oral Daily  . atorvastatin  80 mg Oral Daily  . carvedilol  25 mg Oral BID WC  . furosemide  20 mg Intravenous BID  . lisinopril  20 mg Oral Daily  . methylPREDNISolone (SOLU-MEDROL) injection  20 mg Intravenous Q12H  . nicotine  21 mg Transdermal Daily  . potassium chloride  40 mEq Oral BID  . sodium chloride flush  3 mL Intravenous Q12H   Continuous Infusions: . sodium chloride 75 mL (02/18/20 0852)  . cefTRIAXone (ROCEPHIN)  IV 1 g (02/18/20 0854)     LOS: 1 day    Time spent: 32 mins    Charise Killian, MD [facility] Pager [phone]  If 7PM-7AM, please contact night-coverage [email or web address] 02/18/2020, 11:47 AM

## 2020-02-18 NOTE — Progress Notes (Signed)
[facility] Cardiology  SUBJECTIVE: Patient laying in bed, reports feeling better, responding to intravenous furosemide with diuresis   Vitals:   02/17/20 2009 02/18/20 0332 02/18/20 0336 02/18/20 0740  BP: 115/87  (!) 153/114 102/88  Pulse: 94  83 78  Resp: 18  18   Temp: 97.8 F (36.6 C)  97.8 F (36.6 C) 97.8 F (36.6 C)  TempSrc: Oral   Oral  SpO2: 90%  94% 90%  Weight:  89.4 kg    Height:         Intake/Output Summary (Last 24 hours) at 02/18/2020 0839 Last data filed at 02/18/2020 2585 Gross per 24 hour  Intake 1330 ml  Output 1225 ml  Net 105 ml      PHYSICAL EXAM  General: Well developed, well nourished, in no acute distress HEENT:  Normocephalic and atramatic Neck:  No JVD.  Lungs: Clear bilaterally to auscultation and percussion. Heart: HRRR . Normal S1 and S2 without gallops or murmurs.  Abdomen: Bowel sounds are positive, abdomen soft and non-tender  Msk:  Back normal, normal gait. Normal strength and tone for age. Extremities: No clubbing, cyanosis or edema.   Neuro: Alert and oriented X 3. Psych:  Good affect, responds appropriately   LABS: Basic Metabolic Panel: Recent Labs    02/17/20 0526  NA 142  K 2.3*  CL 91*  CO2 37*  GLUCOSE 128*  BUN 14  CREATININE 1.00  CALCIUM 8.3*  MG 1.4*   Liver Function Tests: Recent Labs    02/17/20 0526  AST 14*  ALT 7  ALKPHOS 66  BILITOT 1.0  PROT 6.8  ALBUMIN 3.3*   No results for input(s): LIPASE, AMYLASE in the last 72 hours. CBC: Recent Labs    02/17/20 0526  WBC 9.1  NEUTROABS 6.4  HGB 14.1  HCT 42.7  MCV 112.1*  PLT 189   Cardiac Enzymes: No results for input(s): CKTOTAL, CKMB, CKMBINDEX, TROPONINI in the last 72 hours. BNP: Invalid input(s): POCBNP D-Dimer: No results for input(s): DDIMER in the last 72 hours. Hemoglobin A1C: No results for input(s): HGBA1C in the last 72 hours. Fasting Lipid Panel: No results for input(s): CHOL, HDL, LDLCALC, TRIG, CHOLHDL, LDLDIRECT in the last  72 hours. Thyroid Function Tests: No results for input(s): TSH, T4TOTAL, T3FREE, THYROIDAB in the last 72 hours.  Invalid input(s): FREET3 Anemia Panel: No results for input(s): VITAMINB12, FOLATE, FERRITIN, TIBC, IRON, RETICCTPCT in the last 72 hours.  DG Chest Portable 1 View  Result Date: 02/17/2020 CLINICAL DATA:  Congestive heart failure. Bilateral lower extremity and left arm swelling. EXAM: PORTABLE CHEST 1 VIEW COMPARISON:  Two-view chest x-ray 02/01/2018 FINDINGS: The heart is enlarged. Mild pulmonary vascular congestion is present. No focal airspace disease present. The visualized soft tissues bony thorax are unremarkable. IMPRESSION: Cardiomegaly and mild pulmonary vascular congestion without frank edema. Electronically Signed   By: Marin Roberts M.D.   On: 02/17/2020 07:15     Echo pending  TELEMETRY: Atrial fibrillation at 93 bpm:  ASSESSMENT AND PLAN:  Principal Problem:   Acute on chronic systolic CHF (congestive heart failure) (HCC) Active Problems:   Essential hypertension   Hyperlipidemia   CAD (coronary artery disease), native coronary artery   Hypokalemia   Hypomagnesemia   Elevated troponin   New onset atrial fibrillation (HCC)   Olecranon bursitis of left elbow   NSVT (nonsustained ventricular tachycardia) (HCC)   Tobacco abuse   Apical mural thrombus   Gout_left elbow    1.  Acute on chronic systolic congestive heart failure, underlying ischemic cardiomyopathy with LVEF 25 to 30%, with recent history of aggressive peripheral edema and weight gain, BNP 2228, chest x-ray showing mild pulmonary vascular congestion, responding to IV furosemide with prompt diuresis 2.  Coronary artery disease, status post stent bifurcation LAD stent-D1 and left circumflex 2006, as post anterior STEMI, DES LAD 06/17/2016 with occluded left circumflex, 75% stenosis distal RCA, currently without chest pain 3.  New onset atrial fibrillation in the setting of hypokalemia and  acute on chronic systolic congestive heart failure, rate controlled, chads Vasc score 3 4.  History of apical thrombus, previously on Eliquis, which is discontinued due to easy bruising, with most recent 2D echocardiogram 10/2018 not revealing evidence for thrombus 5.  Hypokalemia, potassium 2.3, receiving IV and p.o. potassium 6.  Essential hypertension  Recommendations  1.  Agree with overall current therapy 2.  Continue diuresis 3.  Carefully monitor renal status 4.  Start Eliquis 5 mg twice daily 5.  Replete potassium 6.  Continue carvedilol 25 mg twice daily and lisinopril 20 mg daily 7.  Review 2D echocardiogram   Marcina Millard, MD, PhD, [facility] 02/18/2020 8:39 AM

## 2020-02-18 NOTE — Progress Notes (Signed)
PT Cancellation Note  Patient Details Name: Cameron [name] MRN: [id number] DOB: August 15, 1960   Cancelled Treatment:    Reason Eval/Treat Not Completed: Medical issues which prohibited therapy, Potassium is 2.7. PT will check on patient tomorrow as time allows.   Jones Broom S,PT DPT 02/18/2020, 2:54 PM

## 2020-02-18 NOTE — Plan of Care (Signed)
°  Problem: Education: Goal: Knowledge of General Education information will improve Description: Including pain rating scale, medication(s)/side effects and non-pharmacologic comfort measures Outcome: Progressing   Problem: Activity: Goal: Capacity to carry out activities will improve Outcome: Progressing   Problem: Health Behavior/Discharge Planning: Goal: Ability to manage health-related needs will improve Outcome: Progressing

## 2020-02-18 NOTE — Progress Notes (Signed)
OT Cancellation Note  Patient Details Name: Cameron [name] MRN: [id number] DOB: 1960/09/30   Cancelled Treatment:    Reason Eval/Treat Not Completed: Medical issues which prohibited therapy. OT order received and chart reviewed. Pt noted to have K+ 2.7 this AM, per therapy guidelines will hold OT evaluation until pt appropriate to engage in physical activity. Will follow up at later time/date as available.   Kathie Dike, M.S. OTR/L  02/18/20, 10:57 AM  ascom [phone]

## 2020-02-19 LAB — CBC
HCT: 43.1 % (ref 39.0–52.0)
Hemoglobin: 14.2 g/dL (ref 13.0–17.0)
MCH: 37 pg — ABNORMAL HIGH (ref 26.0–34.0)
MCHC: 32.9 g/dL (ref 30.0–36.0)
MCV: 112.2 fL — ABNORMAL HIGH (ref 80.0–100.0)
Platelets: 183 10*3/uL (ref 150–400)
RBC: 3.84 MIL/uL — ABNORMAL LOW (ref 4.22–5.81)
RDW: 15.8 % — ABNORMAL HIGH (ref 11.5–15.5)
WBC: 11.5 10*3/uL — ABNORMAL HIGH (ref 4.0–10.5)
nRBC: 0 % (ref 0.0–0.2)

## 2020-02-19 LAB — BASIC METABOLIC PANEL
Anion gap: 14 (ref 5–15)
BUN: 25 mg/dL — ABNORMAL HIGH (ref 6–20)
CO2: 33 mmol/L — ABNORMAL HIGH (ref 22–32)
Calcium: 8.7 mg/dL — ABNORMAL LOW (ref 8.9–10.3)
Chloride: 95 mmol/L — ABNORMAL LOW (ref 98–111)
Creatinine, Ser: 1.02 mg/dL (ref 0.61–1.24)
GFR calc Af Amer: >60 mL/min (ref >60)
GFR calc non Af Amer: >60 mL/min (ref >60)
Glucose, Bld: 124 mg/dL — ABNORMAL HIGH (ref 70–99)
Potassium: 3.2 mmol/L — ABNORMAL LOW (ref 3.5–5.1)
Sodium: 142 mmol/L (ref 135–145)

## 2020-02-19 MED ORDER — POTASSIUM CHLORIDE CRYS ER 20 MEQ PO TBCR
40.0000 meq | EXTENDED_RELEASE_TABLET | Freq: Once | ORAL | Status: AC
  Administered 2020-02-19: 40 meq via ORAL
  Filled 2020-02-19: qty 2

## 2020-02-19 NOTE — Progress Notes (Signed)
Subjective: The patient notes that his elbow feels even better today as the itching he was noting yesterday has resolved.  He has no pain with range of motion or to palpation.  He has no new complaints regarding his left elbow.   Objective: Vital signs in last 24 hours: Temp:  [97.8 F (36.6 C)-98.3 F (36.8 C)] 97.8 F (36.6 C) (08/15 1250) Pulse Rate:  [78-88] 81 (08/15 1250) BP: (121-141)/(78-105) 135/94 (08/15 1250) SpO2:  [92 %-98 %] 93 % (08/15 1250) Weight:  [89.8 kg] 89.8 kg (08/15 0524)  Intake/Output from previous day: 08/14 0701 - 08/15 0700 In: 952.1 [P.O.:840; I.V.:12.1; IV Piggyback:100] Out: 1025 [Urine:1025] Intake/Output this shift: Total I/O In: 360 [P.O.:360] Out: 425 [Urine:425]  Recent Labs    02/17/20 0526 02/18/20 0811 02/19/20 0525  HGB 14.1 14.4 14.2   Recent Labs    02/18/20 0811 02/19/20 0525  WBC 9.3 11.5*  RBC 3.85* 3.84*  HCT 43.5 43.1  PLT 190 183   Recent Labs    02/18/20 0811 02/19/20 0525  NA 142 142  K 2.7* 3.2*  CL 92* 95*  CO2 38* 33*  BUN 20 25*  CREATININE 0.91 1.02  GLUCOSE 140* 124*  CALCIUM 8.5* 8.7*   Recent Labs    02/17/20 0901  INR 1.0    Physical Exam: On examination, the erythema and tenderness noted previously over the posterior elbow and forearm has resolved.  There is still a small amount of fluid in the olecranon bursa, consistent with some residual olecranon bursitis.  He again is able to actively flex and extend his elbow without any pain or discomfort.  He remains neurovascularly intact to the left upper extremity and hand.  Assessment: Left olecranon bursitis, presumably due to gout, improved symptomatically.  Plan: The patient continues to do well with his present medical regimen.  From an orthopedic standpoint, he can be switched over to p.o. antigout medication and his antibiotics can be discontinued.  He may be mobilized as tolerated once cleared medically.  Thank you for asking me to  participate in the care of this delightful man.  I will sign off for now, but please contact me if you need further orthopedic intervention.  He can follow-up in the office on an as necessary basis.   Excell Seltzer Randa Riss 02/19/2020, 3:27 PM

## 2020-02-19 NOTE — Progress Notes (Signed)
[facility] Cardiology  SUBJECTIVE: Laying in bed, reports feeling better, continues to respond to intravenous furosemide   Vitals:   02/18/20 1638 02/18/20 1958 02/19/20 0524 02/19/20 0817  BP: (!) 126/102 121/78 (!) 129/96 (!) 141/105  Pulse: 87 84 78 88  Resp:      Temp: 97.8 F (36.6 C) 98 F (36.7 C) 98.3 F (36.8 C) 98.2 F (36.8 C)  TempSrc: Oral Oral Oral Oral  SpO2: 96% 93% 92% 98%  Weight:   89.8 kg   Height:         Intake/Output Summary (Last 24 hours) at 02/19/2020 0831 Last data filed at 02/19/2020 0820 Gross per 24 hour  Intake 952.06 ml  Output 1150 ml  Net -197.94 ml      PHYSICAL EXAM  General: Well developed, well nourished, in no acute distress HEENT:  Normocephalic and atramatic Neck:  No JVD.  Lungs: Clear bilaterally to auscultation and percussion. Heart: HRRR . Normal S1 and S2 without gallops or murmurs.  Abdomen: Bowel sounds are positive, abdomen soft and non-tender  Msk:  Back normal, normal gait. Normal strength and tone for age. Extremities: No clubbing, cyanosis or edema.   Neuro: Alert and oriented X 3. Psych:  Good affect, responds appropriately   LABS: Basic Metabolic Panel: Recent Labs    02/17/20 0526 02/17/20 0526 02/18/20 0811 02/19/20 0525  NA 142   < > 142 142  K 2.3*   < > 2.7* 3.2*  CL 91*   < > 92* 95*  CO2 37*   < > 38* 33*  GLUCOSE 128*   < > 140* 124*  BUN 14   < > 20 25*  CREATININE 1.00   < > 0.91 1.02  CALCIUM 8.3*   < > 8.5* 8.7*  MG 1.4*  --  1.8  --    < > = values in this interval not displayed.   Liver Function Tests: Recent Labs    02/17/20 0526  AST 14*  ALT 7  ALKPHOS 66  BILITOT 1.0  PROT 6.8  ALBUMIN 3.3*   No results for input(s): LIPASE, AMYLASE in the last 72 hours. CBC: Recent Labs    02/17/20 0526 02/17/20 0526 02/18/20 0811 02/19/20 0525  WBC 9.1   < > 9.3 11.5*  NEUTROABS 6.4  --   --   --   HGB 14.1   < > 14.4 14.2  HCT 42.7   < > 43.5 43.1  MCV 112.1*   < > 113.0* 112.2*   PLT 189   < > 190 183   < > = values in this interval not displayed.   Cardiac Enzymes: No results for input(s): CKTOTAL, CKMB, CKMBINDEX, TROPONINI in the last 72 hours. BNP: Invalid input(s): POCBNP D-Dimer: No results for input(s): DDIMER in the last 72 hours. Hemoglobin A1C: Recent Labs    02/18/20 0811  HGBA1C 5.4   Fasting Lipid Panel: Recent Labs    02/18/20 0811  CHOL 108  HDL 39*  LDLCALC 52  TRIG 87  CHOLHDL 2.8   Thyroid Function Tests: Recent Labs    02/18/20 0811  TSH 0.479   Anemia Panel: No results for input(s): VITAMINB12, FOLATE, FERRITIN, TIBC, IRON, RETICCTPCT in the last 72 hours.  ECHOCARDIOGRAM COMPLETE  Result Date: 02/18/2020    ECHOCARDIOGRAM REPORT   Patient Name:   Cameron [name] Date of Exam: 02/17/2020 Medical Rec #:  [id number]         Height:  67.0 in Accession #:    [id number]        Weight:       195.5 lb Date of Birth:  1961-05-24         BSA:          2.003 m Patient Age:    59 years          BP:           115/87 mmHg Patient Gender: M                 HR:           87 bpm. Exam Location:  [facility] Procedure: 2D Echo Indications:     CHF-Acute Systolic 428.21 / I50.21  History:         Patient has prior history of Echocardiogram examinations, most                  recent 02/02/2018. CHF; Risk Factors:Dyslipidemia, Current                  Smoker and Hypertension.  Sonographer:     Johnathan Hausen Referring Phys:  Wynona Neat NIU Diagnosing Phys: Marcina Millard MD IMPRESSIONS  1. Left ventricular ejection fraction, by estimation, is 20 to 25%. The left ventricle has severely decreased function. The left ventricle has no regional wall motion abnormalities. The left ventricular internal cavity size was mildly to moderately dilated. Left ventricular diastolic parameters are indeterminate.  2. Right ventricular systolic function is normal. The right ventricular size is normal. There is moderately elevated pulmonary artery systolic pressure.  3.  Left atrial size was mildly dilated.  4. The mitral valve is normal in structure. Mild to moderate mitral valve regurgitation. No evidence of mitral stenosis.  5. The aortic valve is normal in structure. Aortic valve regurgitation is not visualized. No aortic stenosis is present.  6. The inferior vena cava is normal in size with greater than 50% respiratory variability, suggesting right atrial pressure of 3 mmHg. FINDINGS  Left Ventricle: Left ventricular ejection fraction, by estimation, is 20 to 25%. The left ventricle has severely decreased function. The left ventricle has no regional wall motion abnormalities. Definity contrast agent was given IV to delineate the left  ventricular endocardial borders. The left ventricular internal cavity size was mildly to moderately dilated. There is no left ventricular hypertrophy. Left ventricular diastolic parameters are indeterminate. Right Ventricle: The right ventricular size is normal. No increase in right ventricular wall thickness. Right ventricular systolic function is normal. There is moderately elevated pulmonary artery systolic pressure. The tricuspid regurgitant velocity is 3.35 m/s, and with an assumed right atrial pressure of 10 mmHg, the estimated right ventricular systolic pressure is 54.9 mmHg. Left Atrium: Left atrial size was mildly dilated. Right Atrium: Right atrial size was normal in size. Pericardium: There is no evidence of pericardial effusion. Mitral Valve: The mitral valve is normal in structure. Normal mobility of the mitral valve leaflets. Mild to moderate mitral valve regurgitation. No evidence of mitral valve stenosis. Tricuspid Valve: The tricuspid valve is normal in structure. Tricuspid valve regurgitation is mild . No evidence of tricuspid stenosis. Aortic Valve: The aortic valve is normal in structure. Aortic valve regurgitation is not visualized. No aortic stenosis is present. Pulmonic Valve: The pulmonic valve was normal in structure.  Pulmonic valve regurgitation is not visualized. No evidence of pulmonic stenosis. Aorta: The aortic root is normal in size and structure. Venous: The inferior vena cava is normal  in size with greater than 50% respiratory variability, suggesting right atrial pressure of 3 mmHg. IAS/Shunts: No atrial level shunt detected by color flow Doppler.  LEFT VENTRICLE PLAX 2D LVIDd:         5.35 cm LVIDs:         4.83 cm LV PW:         0.68 cm LV IVS:        0.77 cm LVOT diam:     1.90 cm LVOT Area:     2.84 cm  LV Volumes (MOD) LV vol d, MOD A4C: 150.0 ml LV vol s, MOD A4C: 120.0 ml LV SV MOD A4C:     150.0 ml IVC IVC diam: 2.22 cm LEFT ATRIUM             Index       RIGHT ATRIUM           Index LA diam:        5.20 cm 2.60 cm/m  RA Area:     24.00 cm LA Vol (A2C):   80.2 ml 40.04 ml/m RA Volume:   79.10 ml  39.50 ml/m LA Vol (A4C):   99.7 ml 49.78 ml/m LA Biplane Vol: 92.9 ml 46.39 ml/m   AORTA Ao Root diam: 3.80 cm MR Peak grad: 103.6 mmHg  TRICUSPID VALVE MR Mean grad: 70.0 mmHg   TR Peak grad:   44.9 mmHg MR Vmax:      509.00 cm/s TR Vmax:        335.00 cm/s MR Vmean:     403.0 cm/s                           SHUNTS                           Systemic Diam: 1.90 cm Marcina Millard MD Electronically signed by Marcina Millard MD Signature Date/Time: 02/18/2020/12:26:26 PM    Final      Echo LVEF 20 to 25%  TELEMETRY: Atrial fibrillation at 89 bpm:  ASSESSMENT AND PLAN:  Principal Problem:   Acute on chronic systolic CHF (congestive heart failure) (HCC) Active Problems:   Essential hypertension   Hyperlipidemia   CAD (coronary artery disease), native coronary artery   Hypokalemia   Hypomagnesemia   Elevated troponin   New onset atrial fibrillation (HCC)   Olecranon bursitis of left elbow   NSVT (nonsustained ventricular tachycardia) (HCC)   Tobacco abuse   Apical mural thrombus   Gout_left elbow    1.  Acute on chronic systolic congestive heart failure, underlying ischemic  cardiomyopathy with LV EF 20-25 %, with recent history of aggressive peripheral edema and weight gain, BNP 2228, chest x-ray showing mild pulmonary vascular congestion, responding to IV furosemide with diuresis 2.  Coronary artery disease, status post stent bifurcation LAD stent-D1 and left circumflex 2006, as post anterior STEMI, DES LAD 06/17/2016 with occluded left circumflex, 75% stenosis distal RCA, currently without chest pain 3.  New onset atrial fibrillation in the setting of hypokalemia and acute on chronic systolic congestive heart failure, rate controlled, chads Vasc score 3, started on Eliquis 4.  History of apical thrombus, previously on Eliquis, which is discontinued due to easy bruising, with most recent 2D echocardiogram 02/18/2020 not revealing evidence for thrombus 5.  Hypokalemia, admission potassium 2.3, received IV and p.o. potassium, repeat potassium 3.2 6.  Essential hypertension, systolic blood pressure mildly  elevated 7.  Left olecranon bursitis, status post aspiration, possible gout, symptomatically improved on IV steroids and antibiotics  Recommendations  1.  Agree with overall current therapy 2.  Continue diuresis 3.  Carefully monitor renal status 4.  Continue Eliquis 5 mg twice daily 5.  Continue carvedilol 25 mg twice daily and lisinopril 20 mg daily   Marcina Millard, MD, PhD, [facility] 02/19/2020 8:31 AM

## 2020-02-19 NOTE — Evaluation (Signed)
Occupational Therapy Evaluation Patient Details Name: Cameron [name] MRN: [id number] DOB: Jul 18, 1960 Today's Date: 02/19/2020    History of Present Illness Cameron [name] is a 59 y.o. male with medical history significant of hypertension, hyperlipidemia, CAD, STEMI, sCHF with EF of 20-25%, tobacco abuse, apical mural thrombus, who presents with worsening bilateral leg edema and left elbow pain.   Clinical Impression   Cameron [name] was seen for OT evaluation this date. Prior to hospital admission, pt was Independent c mobility and I/ADLs including working - pt reports inability to mobilize when gout in feet acts up. Pt lives alone in modular home c 5 STE. Pt presents to acute OT grossly Independent c ADLs and functional mobility including LBD in sitting and functional reach standing inside BOS. No skilled acute OT needs identified, will sign off at this time. Recommend no OT follow up at discharge.    Follow Up Recommendations  No OT follow up    Equipment Recommendations  None recommended by OT    Recommendations for Other Services       Precautions / Restrictions Precautions Precautions: None Restrictions Weight Bearing Restrictions: No      Mobility Bed Mobility Overal bed mobility: Independent    Transfers Overall transfer level: Independent     General transfer comment: Rises easily w/o AD     Balance Overall balance assessment: Modified Independent (Single UE support for single leg balance)        ADL either performed or assessed with clinical judgement   ADL Overall ADL's : Needs assistance/impaired      General ADL Comments: Independent ADL t/fs. MOD I don/doff B socks seated. MOD I standing reaching inside BOS       Pertinent Vitals/Pain Pain Assessment: No/denies pain     Hand Dominance Right   Extremity/Trunk Assessment Upper Extremity Assessment Upper Extremity Assessment: Overall WFL for tasks assessed   Lower Extremity Assessment Lower  Extremity Assessment: Overall WFL for tasks assessed       Communication Communication Communication: No difficulties   Cognition Arousal/Alertness: Awake/alert Behavior During Therapy: WFL for tasks assessed/performed Overall Cognitive Status: Within Functional Limits for tasks assessed         General Comments  L elbow swelling noted, pt reports improving    Exercises Exercises: Other exercises Other Exercises Other Exercises: Pt educated re: OT role, DME recs, d/c recs, home/routines modifications, falls preventions Other Exercises: LBD, UBD, sup<>sit, sit<>stand, sitting/standing balalnce/tolerance   Shoulder Instructions      Home Living Family/patient expects to be discharged to:: Private residence Living Arrangements: Alone   Type of Home: House (modular home) Home Access: Stairs to enter Secretary/administrator of Steps: 5 Entrance Stairs-Rails: Can reach both Home Layout: One level     Bathroom Shower/Tub: Walk-in shower;Tub only   Bathroom Toilet: Standard     Home Equipment: Crutches; Shower seat          Prior Functioning/Environment Level of Independence: Independent        Comments: Pt reports independent c I/ADLs including working - reports difficulty walking when gout acts up         OT Problem List: Decreased knowledge of use of DME or AE      OT Treatment/Interventions:      OT Goals(Current goals can be found in the care plan section) Acute Rehab OT Goals Patient Stated Goal: To walk up stairs w/o assist OT Goal Formulation: With patient Time For Goal Achievement: 03/04/20 Potential to Achieve Goals: Good  AM-PAC OT "6 Clicks" Daily Activity     Outcome Measure Help from another person eating meals?: None Help from another person taking care of personal grooming?: None Help from another person toileting, which includes using toliet, bedpan, or urinal?: None Help from another person bathing (including washing, rinsing, drying)?:  A Little Help from another person to put on and taking off regular upper body clothing?: None Help from another person to put on and taking off regular lower body clothing?: A Little 6 Click Score: 22   End of Session    Activity Tolerance: Patient tolerated treatment well Patient left: in bed;with call bell/phone within reach  OT Visit Diagnosis: Other abnormalities of gait and mobility (R26.89)                Time: 5465-0354 OT Time Calculation (min): 12 min Charges:  OT General Charges $OT Visit: 1 Visit OT Evaluation $OT Eval Low Complexity: 1 Low OT Treatments $Self Care/Home Management : 8-22 mins  Kathie Dike, M.S. OTR/L  02/19/20, 12:50 PM  ascom [phone]

## 2020-02-19 NOTE — Progress Notes (Signed)
Physical Therapy Evaluation Patient Details Name: Cameron [name] MRN: [id number] DOB: 1961-03-07 Today's Date: 02/19/2020   History of Present Illness  Cameron [name] is a 59 y.o. male with medical history significant of hypertension, hyperlipidemia, CAD, STEMI, sCHF with EF of 20-25%, tobacco abuse, apical mural thrombus, who presents with worsening bilateral leg edema and left elbow pain.  Clinical Impression  Patient agrees to PT evaluation. He is independent with bed mobioity and transfers. He ambulates with RW 300 feet with MI. He reports that sometimes he has gout flair ups and gets weak and would be safer to have a RW. He has [facility] strength and balance is WNL. He has no other skilled needs but would benefit from a RW for home use.     Follow Up Recommendations No PT follow up    Equipment Recommendations  Rolling walker with 5" wheels    Recommendations for Other Services       Precautions / Restrictions Precautions Precautions: None Restrictions Weight Bearing Restrictions: No      Mobility  Bed Mobility Overal bed mobility: Independent                Transfers Overall transfer level: Independent               General transfer comment: Rises easily w/o AD   Ambulation/Gait Ambulation/Gait assistance: Modified independent (Device/Increase time) Gait Distance (Feet): 300 Feet Assistive device: Rolling walker (2 wheeled) Gait Pattern/deviations: Step-through pattern        Stairs            Wheelchair Mobility    Modified Rankin (Stroke Patients Only)       Balance Overall balance assessment: Modified Independent                                           Pertinent Vitals/Pain Pain Assessment: No/denies pain    Home Living Family/patient expects to be discharged to:: Private residence Living Arrangements: Alone   Type of Home: House (modular home) Home Access: Stairs to enter Entrance Stairs-Rails: Can  reach both Entrance Stairs-Number of Steps: 5 Home Layout: One level Home Equipment: Crutches;Shower seat      Prior Function Level of Independence: Independent         Comments: Pt reports independent c I/ADLs including working - reports difficulty walking when gout acts up      Hand Dominance   Dominant Hand: Right    Extremity/Trunk Assessment   Upper Extremity Assessment Upper Extremity Assessment: Overall WFL for tasks assessed    Lower Extremity Assessment Lower Extremity Assessment: Overall WFL for tasks assessed       Communication   Communication: No difficulties  Cognition Arousal/Alertness: Awake/alert Behavior During Therapy: WFL for tasks assessed/performed Overall Cognitive Status: Within Functional Limits for tasks assessed                                        General Comments General comments (skin integrity, edema, etc.): L elbow swelling noted, pt reports improving    Exercises Other Exercises Other Exercises: Pt educated re: OT role, DME recs, d/c recs, home/routines modifications, falls preventions Other Exercises: LBD, UBD, sup<>sit, sit<>stand, sitting/standing balalnce/tolerance   Assessment/Plan    PT Assessment Patient needs continued PT services  PT Problem  List Decreased activity tolerance       PT Treatment Interventions Gait training    PT Goals (Current goals can be found in the Care Plan section)  Acute Rehab PT Goals Patient Stated Goal: to use a RW PT Goal Formulation: All assessment and education complete, DC therapy    Frequency     Barriers to discharge        Co-evaluation               AM-PAC PT "6 Clicks" Mobility  Outcome Measure Help needed turning from your back to your side while in a flat bed without using bedrails?: None Help needed moving from lying on your back to sitting on the side of a flat bed without using bedrails?: None Help needed moving to and from a bed to a chair  (including a wheelchair)?: None Help needed standing up from a chair using your arms (e.g., wheelchair or bedside chair)?: None Help needed to walk in hospital room?: None Help needed climbing 3-5 steps with a railing? : None 6 Click Score: 24    End of Session Equipment Utilized During Treatment: Gait belt Activity Tolerance: Patient tolerated treatment well Patient left: in chair Nurse Communication: Mobility status PT Visit Diagnosis: Difficulty in walking, not elsewhere classified (R26.2)    Time: 1300-1320 PT Time Calculation (min) (ACUTE ONLY): 20 min   Charges:   PT Evaluation $PT Eval Low Complexity: 1 Low PT Treatments $Gait Training: 8-22 mins         Ezekiel Ina, PT DPT 02/19/2020, 2:15 PM

## 2020-02-19 NOTE — Progress Notes (Signed)
PROGRESS NOTE    Cameron [name]  PVX:[id number] DOB: [birth year] DOA: 02/17/2020 PCP: Patient, No Pcp Per    Assessment & Plan:   Principal Problem:   Acute on chronic systolic CHF (congestive heart failure) (HCC) Active Problems:   Essential hypertension   Hyperlipidemia   CAD (coronary artery disease), native coronary artery   Hypokalemia   Hypomagnesemia   Elevated troponin   New onset atrial fibrillation (HCC)   Olecranon bursitis of left elbow   NSVT (nonsustained ventricular tachycardia) (HCC)   Tobacco abuse   Apical mural thrombus   Gout_left elbow   Acute on chronic systolic CHF:  echo shows EF 20-25%. Continue on IV lasix. Monitor I/Os & daily weights. Neg fluid balance but less than 1L. Echo shows EF 20-25%, diastolic function indeterminate, no wall motion abnormalities. Cardio following and recs apprec   A. fib: new onset. CHADsVASc score of 3, will need anticoagulation. Continue on coreg & eliquis   Gout: of left elbow and possible olecranon bursitis of left elbow. Continue on IV solumedrol. D/c rocephin & will monitor.  Synovial fluid cx NGTD. Ortho surg following and recs apprec  Apical mural thrombus: per cardio "previously on Eliquis, which patient discontinued due to easy bruising. Echocardiogram in 01/2018 and 10/2018 did not reveal evidence of thrombus".  HTN: continue on coreg, lisinopril. IV hydralazine prn   Hyperlipidemia: not taking a statin at home but started on a statin while in the hospital   Elevated troponin: likely secondary to demand ischemia.  Hx of CAD.   Hypokalemia: KCl repleted again. Will continue to monitor   Hypomagnesemia:  WNL today. Will continue to monitor   Tobacco abuse: nicotine patch to prevent w/drawal. Smoking cessation counseling    DVT prophylaxis: eliquis Code Status: full Family Communication:  Disposition Plan: depends on PT/OT recs   Consultants:   Cardio  Ortho surg    Procedures:     Antimicrobials:    Subjective: Pt c/o b/l LE swelling   Objective: Vitals:   02/18/20 1202 02/18/20 1638 02/18/20 1958 02/19/20 0524  BP: (!) 121/99 (!) 126/102 121/78 (!) 129/96  Pulse: 82 87 84 78  Resp:      Temp: 98 F (36.7 C) 97.8 F (36.6 C) 98 F (36.7 C) 98.3 F (36.8 C)  TempSrc: Oral Oral Oral Oral  SpO2: 91% 96% 93% 92%  Weight:    89.8 kg  Height:        Intake/Output Summary (Last 24 hours) at 02/19/2020 0750 Last data filed at 02/19/2020 0527 Gross per 24 hour  Intake 952.06 ml  Output 1025 ml  Net -72.94 ml   Filed Weights   02/17/20 1332 02/18/20 0332 02/19/20 0524  Weight: 88.7 kg 89.4 kg 89.8 kg    Examination: General exam: Appears calm and comfortable  Respiratory system: decreased breath sounds b/l. No rales Cardiovascular system: S1 & S2 +. No rubs, gallops or clicks.  Gastrointestinal system: Abdomen is obese, soft and nontender. Hypoactive bowel sounds heard. Central nervous system: Alert and oriented. Moves all 4 extremities Psychiatry: Judgement and insight appear normal. Mood & affect appropriate.      Data Reviewed: I have personally reviewed following labs and imaging studies  CBC: Recent Labs  Lab 02/17/20 0526 02/18/20 0811 02/19/20 0525  WBC 9.1 9.3 11.5*  NEUTROABS 6.4  --   --   HGB 14.1 14.4 14.2  HCT 42.7 43.5 43.1  MCV 112.1* 113.0* 112.2*  PLT 189 190 183   Basic  Metabolic Panel: Recent Labs  Lab 02/17/20 0526 02/18/20 0811 02/19/20 0525  NA 142 142 142  K 2.3* 2.7* 3.2*  CL 91* 92* 95*  CO2 37* 38* 33*  GLUCOSE 128* 140* 124*  BUN 14 20 25*  CREATININE 1.00 0.91 1.02  CALCIUM 8.3* 8.5* 8.7*  MG 1.4* 1.8  --    GFR: Estimated Creatinine Clearance: 84.4 mL/min (by C-G formula based on SCr of 1.02 mg/dL). Liver Function Tests: Recent Labs  Lab 02/17/20 0526  AST 14*  ALT 7  ALKPHOS 66  BILITOT 1.0  PROT 6.8  ALBUMIN 3.3*   No results for input(s): LIPASE, AMYLASE in the last 168  hours. No results for input(s): AMMONIA in the last 168 hours. Coagulation Profile: Recent Labs  Lab 02/17/20 0901  INR 1.0   Cardiac Enzymes: No results for input(s): CKTOTAL, CKMB, CKMBINDEX, TROPONINI in the last 168 hours. BNP (last 3 results) No results for input(s): PROBNP in the last 8760 hours. HbA1C: Recent Labs    02/18/20 0811  HGBA1C 5.4   CBG: No results for input(s): GLUCAP in the last 168 hours. Lipid Profile: Recent Labs    02/18/20 0811  CHOL 108  HDL 39*  LDLCALC 52  TRIG 87  CHOLHDL 2.8   Thyroid Function Tests: Recent Labs    02/18/20 0811  TSH 0.479   Anemia Panel: No results for input(s): VITAMINB12, FOLATE, FERRITIN, TIBC, IRON, RETICCTPCT in the last 72 hours. Sepsis Labs: No results for input(s): PROCALCITON, LATICACIDVEN in the last 168 hours.  Recent Results (from the past 240 hour(s))  Body fluid culture     Status: None (Preliminary result)   Collection Time: 02/17/20  6:53 AM   Specimen: Synovium; Body Fluid  Result Value Ref Range Status   Specimen Description   Final    SYNOVIAL ELBOW Performed at [facility], [address] [place], [place] [address]    Special Requests   Final    NONE Performed at [facility], [address] [place], [place] [address]    Gram Stain NO WBC SEEN NO ORGANISMS SEEN   Final   Culture   Final    NO GROWTH < 24 HOURS Performed at [facility], [address], [place], [place] [address]    Report Status PENDING  Incomplete  SARS Coronavirus 2 by RT PCR (hospital order, performed in [facility] hospital lab) Nasopharyngeal Nasopharyngeal Swab     Status: None   Collection Time: 02/17/20  7:16 AM   Specimen: Nasopharyngeal Swab  Result Value Ref Range Status   SARS Coronavirus 2 NEGATIVE NEGATIVE Final    Comment: (NOTE) SARS-CoV-2 target nucleic acids are NOT DETECTED.  The SARS-CoV-2 RNA is generally detectable in upper and lower respiratory specimens during  the acute phase of infection. The lowest concentration of SARS-CoV-2 viral copies this assay can detect is 250 copies / mL. A negative result does not preclude SARS-CoV-2 infection and should not be used as the sole basis for treatment or other patient management decisions.  A negative result may occur with improper specimen collection / handling, submission of specimen other than nasopharyngeal swab, presence of viral mutation(s) within the areas targeted by this assay, and inadequate number of viral copies (<250 copies / mL). A negative result must be combined with clinical observations, patient history, and epidemiological information.  Fact Sheet for Patients:   BoilerBrush.com.cy  Fact Sheet for Healthcare Providers: https://pope.com/  This test is not yet approved  or  cleared by the [place] and has been authorized for detection and/or diagnosis of SARS-CoV-2 by FDA under an Emergency Use Authorization (EUA).  This EUA will remain in effect (meaning this test can be used) for the duration of the COVID-19 declaration under Section 564(b)(1) of the Act, 21 U.S.C. section 360bbb-3(b)(1), unless the authorization is terminated or revoked sooner.  Performed at [facility], [address] [place], [place] [address]   Culture, blood (Routine X 2) w Reflex to ID Panel     Status: None (Preliminary result)   Collection Time: 02/17/20  9:01 AM   Specimen: BLOOD  Result Value Ref Range Status   Specimen Description BLOOD BLOOD LEFT FOREARM  Final   Special Requests   Final    BOTTLES DRAWN AEROBIC AND ANAEROBIC Blood Culture adequate volume   Culture   Final    NO GROWTH 2 DAYS Performed at [facility], [address], [place], [place] [address]    Report Status PENDING  Incomplete  Culture, blood (Routine X 2) w Reflex to ID Panel     Status: None (Preliminary result)   Collection Time: 02/17/20  9:01  AM   Specimen: BLOOD  Result Value Ref Range Status   Specimen Description BLOOD BLOOD RIGHT FOREARM  Final   Special Requests   Final    BOTTLES DRAWN AEROBIC AND ANAEROBIC Blood Culture adequate volume   Culture   Final    NO GROWTH 2 DAYS Performed at [facility], [address] [place], [place] [address]    Report Status PENDING  Incomplete         Radiology Studies: ECHOCARDIOGRAM COMPLETE  Result Date: 02/18/2020    ECHOCARDIOGRAM REPORT   Patient Name:   Cameron [name] Date of Exam: 02/17/2020 Medical Rec #:  [id number]         Height:       67.0 in Accession #:    [id number]        Weight:       195.5 lb Date of Birth:  1961-01-25         BSA:          2.003 m Patient Age:    59 years          BP:           115/87 mmHg Patient Gender: M                 HR:           87 bpm. Exam Location:  [facility] Procedure: 2D Echo Indications:     CHF-Acute Systolic 428.21 / I50.21  History:         Patient has prior history of Echocardiogram examinations, most                  recent 02/02/2018. CHF; Risk Factors:Dyslipidemia, Current                  Smoker and Hypertension.  Sonographer:     Johnathan Hausen Referring Phys:  Wynona Neat NIU Diagnosing Phys: Marcina Millard MD IMPRESSIONS  1. Left ventricular ejection fraction, by estimation, is 20 to 25%. The left ventricle has severely decreased function. The left ventricle has no regional wall motion abnormalities. The left ventricular internal cavity size was mildly to moderately dilated. Left ventricular diastolic parameters are indeterminate.  2. Right ventricular systolic function is normal. The right ventricular size is normal. There is  moderately elevated pulmonary artery systolic pressure.  3. Left atrial size was mildly dilated.  4. The mitral valve is normal in structure. Mild to moderate mitral valve regurgitation. No evidence of mitral stenosis.  5. The aortic valve is normal in structure. Aortic valve regurgitation is not  visualized. No aortic stenosis is present.  6. The inferior vena cava is normal in size with greater than 50% respiratory variability, suggesting right atrial pressure of 3 mmHg. FINDINGS  Left Ventricle: Left ventricular ejection fraction, by estimation, is 20 to 25%. The left ventricle has severely decreased function. The left ventricle has no regional wall motion abnormalities. Definity contrast agent was given IV to delineate the left  ventricular endocardial borders. The left ventricular internal cavity size was mildly to moderately dilated. There is no left ventricular hypertrophy. Left ventricular diastolic parameters are indeterminate. Right Ventricle: The right ventricular size is normal. No increase in right ventricular wall thickness. Right ventricular systolic function is normal. There is moderately elevated pulmonary artery systolic pressure. The tricuspid regurgitant velocity is 3.35 m/s, and with an assumed right atrial pressure of 10 mmHg, the estimated right ventricular systolic pressure is 54.9 mmHg. Left Atrium: Left atrial size was mildly dilated. Right Atrium: Right atrial size was normal in size. Pericardium: There is no evidence of pericardial effusion. Mitral Valve: The mitral valve is normal in structure. Normal mobility of the mitral valve leaflets. Mild to moderate mitral valve regurgitation. No evidence of mitral valve stenosis. Tricuspid Valve: The tricuspid valve is normal in structure. Tricuspid valve regurgitation is mild . No evidence of tricuspid stenosis. Aortic Valve: The aortic valve is normal in structure. Aortic valve regurgitation is not visualized. No aortic stenosis is present. Pulmonic Valve: The pulmonic valve was normal in structure. Pulmonic valve regurgitation is not visualized. No evidence of pulmonic stenosis. Aorta: The aortic root is normal in size and structure. Venous: The inferior vena cava is normal in size with greater than 50% respiratory variability,  suggesting right atrial pressure of 3 mmHg. IAS/Shunts: No atrial level shunt detected by color flow Doppler.  LEFT VENTRICLE PLAX 2D LVIDd:         5.35 cm LVIDs:         4.83 cm LV PW:         0.68 cm LV IVS:        0.77 cm LVOT diam:     1.90 cm LVOT Area:     2.84 cm  LV Volumes (MOD) LV vol d, MOD A4C: 150.0 ml LV vol s, MOD A4C: 120.0 ml LV SV MOD A4C:     150.0 ml IVC IVC diam: 2.22 cm LEFT ATRIUM             Index       RIGHT ATRIUM           Index LA diam:        5.20 cm 2.60 cm/m  RA Area:     24.00 cm LA Vol (A2C):   80.2 ml 40.04 ml/m RA Volume:   79.10 ml  39.50 ml/m LA Vol (A4C):   99.7 ml 49.78 ml/m LA Biplane Vol: 92.9 ml 46.39 ml/m   AORTA Ao Root diam: 3.80 cm MR Peak grad: 103.6 mmHg  TRICUSPID VALVE MR Mean grad: 70.0 mmHg   TR Peak grad:   44.9 mmHg MR Vmax:      509.00 cm/s TR Vmax:        335.00 cm/s MR Vmean:  403.0 cm/s                           SHUNTS                           Systemic Diam: 1.90 cm Marcina Millard MD Electronically signed by Marcina Millard MD Signature Date/Time: 02/18/2020/12:26:26 PM    Final         Scheduled Meds: . apixaban  5 mg Oral BID  . aspirin EC  81 mg Oral Daily  . atorvastatin  80 mg Oral Daily  . carvedilol  25 mg Oral BID WC  . furosemide  20 mg Intravenous BID  . lisinopril  20 mg Oral Daily  . methylPREDNISolone (SOLU-MEDROL) injection  20 mg Intravenous Q12H  . nicotine  21 mg Transdermal Daily  . potassium chloride  40 mEq Oral BID  . potassium chloride  40 mEq Oral Once  . sodium chloride flush  3 mL Intravenous Q12H   Continuous Infusions: . sodium chloride Stopped (02/18/20 1035)  . cefTRIAXone (ROCEPHIN)  IV Stopped (02/18/20 0924)     LOS: 2 days    Time spent: 30 mins    Charise Killian, MD [facility] Pager [phone]  If 7PM-7AM, please contact night-coverage [email or web address] 02/19/2020, 7:50 AM

## 2020-02-20 DIAGNOSIS — I5023 Acute on chronic systolic (congestive) heart failure: Secondary | ICD-10-CM

## 2020-02-20 LAB — BASIC METABOLIC PANEL
Anion gap: 15 (ref 5–15)
BUN: 32 mg/dL — ABNORMAL HIGH (ref 6–20)
CO2: 34 mmol/L — ABNORMAL HIGH (ref 22–32)
Calcium: 9 mg/dL (ref 8.9–10.3)
Chloride: 92 mmol/L — ABNORMAL LOW (ref 98–111)
Creatinine, Ser: 1.15 mg/dL (ref 0.61–1.24)
GFR calc Af Amer: >60 mL/min (ref >60)
GFR calc non Af Amer: >60 mL/min (ref >60)
Glucose, Bld: 130 mg/dL — ABNORMAL HIGH (ref 70–99)
Potassium: 3.3 mmol/L — ABNORMAL LOW (ref 3.5–5.1)
Sodium: 141 mmol/L (ref 135–145)

## 2020-02-20 LAB — CBC
HCT: 44.8 % (ref 39.0–52.0)
Hemoglobin: 14.2 g/dL (ref 13.0–17.0)
MCH: 36.5 pg — ABNORMAL HIGH (ref 26.0–34.0)
MCHC: 31.7 g/dL (ref 30.0–36.0)
MCV: 115.2 fL — ABNORMAL HIGH (ref 80.0–100.0)
Platelets: 187 10*3/uL (ref 150–400)
RBC: 3.89 MIL/uL — ABNORMAL LOW (ref 4.22–5.81)
RDW: 15.9 % — ABNORMAL HIGH (ref 11.5–15.5)
WBC: 13.7 10*3/uL — ABNORMAL HIGH (ref 4.0–10.5)
nRBC: 0 % (ref 0.0–0.2)

## 2020-02-20 LAB — BODY FLUID CULTURE
Culture: NO GROWTH
Gram Stain: NONE SEEN

## 2020-02-20 MED ORDER — HYDRALAZINE HCL 20 MG/ML IJ SOLN
5.0000 mg | INTRAMUSCULAR | Status: DC | PRN
  Administered 2020-02-20 – 2020-02-21 (×2): 5 mg via INTRAVENOUS
  Filled 2020-02-20 (×2): qty 1

## 2020-02-20 MED ORDER — HYDRALAZINE HCL 20 MG/ML IJ SOLN: 20.0000 mg | Freq: Four times a day (QID) | INTRAMUSCULAR | Status: DC | PRN

## 2020-02-20 MED ORDER — POTASSIUM CHLORIDE CRYS ER 20 MEQ PO TBCR
40.0000 meq | EXTENDED_RELEASE_TABLET | Freq: Two times a day (BID) | ORAL | Status: AC
  Administered 2020-02-20 (×2): 40 meq via ORAL
  Filled 2020-02-20 (×2): qty 2

## 2020-02-20 MED ORDER — FUROSEMIDE 10 MG/ML IJ SOLN
40.0000 mg | Freq: Two times a day (BID) | INTRAMUSCULAR | Status: DC
  Administered 2020-02-20 – 2020-02-21 (×2): 40 mg via INTRAVENOUS
  Filled 2020-02-20 (×2): qty 4

## 2020-02-20 MED ORDER — SODIUM CHLORIDE 0.9% FLUSH
10.0000 mL | Freq: Two times a day (BID) | INTRAVENOUS | Status: DC
  Administered 2020-02-20 – 2020-02-21 (×2): 10 mL via INTRAVENOUS

## 2020-02-20 MED ORDER — ACETAMINOPHEN 325 MG PO TABS: 650.0000 mg | ORAL_TABLET | Freq: Four times a day (QID) | ORAL | Status: DC | PRN

## 2020-02-20 MED ORDER — METHYLPREDNISOLONE SODIUM SUCC 40 MG IJ SOLR
20.0000 mg | INTRAMUSCULAR | Status: DC
  Administered 2020-02-20: 20 mg via INTRAVENOUS
  Filled 2020-02-20: qty 1

## 2020-02-20 MED ORDER — COLCHICINE 0.6 MG PO TABS
0.6000 mg | ORAL_TABLET | Freq: Every day | ORAL | Status: DC
  Administered 2020-02-20 – 2020-02-21 (×2): 0.6 mg via ORAL
  Filled 2020-02-20 (×2): qty 1

## 2020-02-20 NOTE — Progress Notes (Signed)
PROGRESS NOTE    Cameron [name]  NLZ:[id number] DOB: 25-Aug-1960 DOA: 02/17/2020 PCP: Patient, No Pcp Per    Assessment & Plan:   Principal Problem:   Acute on chronic systolic CHF (congestive heart failure) (HCC) Active Problems:   Essential hypertension   Hyperlipidemia   CAD (coronary artery disease), native coronary artery   Hypokalemia   Hypomagnesemia   Elevated troponin   New onset atrial fibrillation (HCC)   Olecranon bursitis of left elbow   NSVT (nonsustained ventricular tachycardia) (HCC)   Tobacco abuse   Apical mural thrombus   Gout_left elbow   Acute on chronic systolic CHF:  echo shows EF 20-25%. Increased IV lasix dose & frequency. Monitor I/Os & daily weights. Neg fluid balance but less than 1L. Echo shows EF 20-25%, diastolic function indeterminate, no wall motion abnormalities. Cardio following and recs apprec   A. fib: new onset. CHADsVASc score of 3. Continue on coreg & eliquis   Gout: of left elbow and possible olecranon bursitis of left elbow. Started on IV solumedrol taper & started colchicine. D/c rocephin & will monitor.  Synovial fluid cx NGTD. Ortho surg following and recs apprec  Apical mural thrombus: per cardio "previously on Eliquis, which patient discontinued due to easy bruising. Echocardiogram in 01/2018 and 10/2018 did not reveal evidence of thrombus".  HTN: continue on coreg, lisinopril. IV hydralazine prn   Hyperlipidemia: continue on statin   Elevated troponin: likely secondary to demand ischemia.  Hx of CAD.   Hypokalemia: potassium repleted again today. Will continue to monitor   Hypomagnesemia:  WNL today. Will continue to monitor   Tobacco abuse: nicotine patch to prevent w/drawal. Smoking cessation counseling    DVT prophylaxis: eliquis Code Status: full Family Communication:  Disposition Plan: depends on PT recs  Status is: Inpatient  Remains inpatient appropriate because:IV treatments appropriate due to intensity  of illness or inability to take PO   Dispo: The patient is from: Home              Anticipated d/c is to: Home vs home health               Anticipated d/c date is: 1 day              Patient currently is not medically stable to d/c.      Consultants:   Cardio  Ortho surg    Procedures:    Antimicrobials:    Subjective: Pt c/o leg swelling b/l  Objective: Vitals:   02/20/20 0641 02/20/20 0809 02/20/20 0956 02/20/20 1155  BP: (!) 137/100 (!) 141/107 (!) 135/107 133/88  Pulse: 74 72 86 70  Resp:  18    Temp: (!) 97.5 F (36.4 C) 98 F (36.7 C)    TempSrc: Oral Oral    SpO2: 98% 95%  96%  Weight: 90.6 kg     Height:        Intake/Output Summary (Last 24 hours) at 02/20/2020 1308 Last data filed at 02/20/2020 0957 Gross per 24 hour  Intake 840 ml  Output 550 ml  Net 290 ml   Filed Weights   02/18/20 0332 02/19/20 0524 02/20/20 0641  Weight: 89.4 kg 89.8 kg 90.6 kg    Examination:  General exam: Appears calm and comfortable  Respiratory system: decrease breath sounds b/l. No rhonchi Cardiovascular system: S1 & S2 +. No rubs, gallops or clicks.  Gastrointestinal system: Abdomen is obese, soft and nontender. Hypoactive bowel sounds heard. Central nervous system: Alert  and oriented. Moves all 4 extremities Psychiatry: Judgement and insight appear normal. Mood & affect appropriate.     Data Reviewed: I have personally reviewed following labs and imaging studies  CBC: Recent Labs  Lab 02/17/20 0526 02/18/20 0811 02/19/20 0525 02/20/20 0729  WBC 9.1 9.3 11.5* 13.7*  NEUTROABS 6.4  --   --   --   HGB 14.1 14.4 14.2 14.2  HCT 42.7 43.5 43.1 44.8  MCV 112.1* 113.0* 112.2* 115.2*  PLT 189 190 183 187   Basic Metabolic Panel: Recent Labs  Lab 02/17/20 0526 02/18/20 0811 02/19/20 0525 02/20/20 0729  NA 142 142 142 141  K 2.3* 2.7* 3.2* 3.3*  CL 91* 92* 95* 92*  CO2 37* 38* 33* 34*  GLUCOSE 128* 140* 124* 130*  BUN 14 20 25* 32*  CREATININE  1.00 0.91 1.02 1.15  CALCIUM 8.3* 8.5* 8.7* 9.0  MG 1.4* 1.8  --   --    GFR: Estimated Creatinine Clearance: 75.2 mL/min (by C-G formula based on SCr of 1.15 mg/dL). Liver Function Tests: Recent Labs  Lab 02/17/20 0526  AST 14*  ALT 7  ALKPHOS 66  BILITOT 1.0  PROT 6.8  ALBUMIN 3.3*   No results for input(s): LIPASE, AMYLASE in the last 168 hours. No results for input(s): AMMONIA in the last 168 hours. Coagulation Profile: Recent Labs  Lab 02/17/20 0901  INR 1.0   Cardiac Enzymes: No results for input(s): CKTOTAL, CKMB, CKMBINDEX, TROPONINI in the last 168 hours. BNP (last 3 results) No results for input(s): PROBNP in the last 8760 hours. HbA1C: Recent Labs    02/18/20 0811  HGBA1C 5.4   CBG: No results for input(s): GLUCAP in the last 168 hours. Lipid Profile: Recent Labs    02/18/20 0811  CHOL 108  HDL 39*  LDLCALC 52  TRIG 87  CHOLHDL 2.8   Thyroid Function Tests: Recent Labs    02/18/20 0811  TSH 0.479   Anemia Panel: No results for input(s): VITAMINB12, FOLATE, FERRITIN, TIBC, IRON, RETICCTPCT in the last 72 hours. Sepsis Labs: No results for input(s): PROCALCITON, LATICACIDVEN in the last 168 hours.  Recent Results (from the past 240 hour(s))  Body fluid culture     Status: None   Collection Time: 02/17/20  6:53 AM   Specimen: Synovium; Body Fluid  Result Value Ref Range Status   Specimen Description   Final    SYNOVIAL ELBOW Performed at [facility], [address], [place], [place] [address]    Special Requests   Final    NONE Performed at [facility], [address] [place], [place] [address]    Gram Stain NO WBC SEEN NO ORGANISMS SEEN   Final   Culture   Final    NO GROWTH 3 DAYS Performed at [facility], [address] [place], [place] [address]    Report Status 02/20/2020 FINAL  Final  SARS Coronavirus 2 by RT PCR (hospital order, performed in [facility] hospital lab) Nasopharyngeal  Nasopharyngeal Swab     Status: None   Collection Time: 02/17/20  7:16 AM   Specimen: Nasopharyngeal Swab  Result Value Ref Range Status   SARS Coronavirus 2 NEGATIVE NEGATIVE Final    Comment: (NOTE) SARS-CoV-2 target nucleic acids are NOT DETECTED.  The SARS-CoV-2 RNA is generally detectable in upper and lower respiratory specimens during the acute phase of infection. The lowest concentration of SARS-CoV-2 viral copies this assay can detect is 250 copies /  mL. A negative result does not preclude SARS-CoV-2 infection and should not be used as the sole basis for treatment or other patient management decisions.  A negative result may occur with improper specimen collection / handling, submission of specimen other than nasopharyngeal swab, presence of viral mutation(s) within the areas targeted by this assay, and inadequate number of viral copies (<250 copies / mL). A negative result must be combined with clinical observations, patient history, and epidemiological information.  Fact Sheet for Patients:   BoilerBrush.com.cy  Fact Sheet for Healthcare Providers: https://pope.com/  This test is not yet approved or  cleared by the [place] FDA and has been authorized for detection and/or diagnosis of SARS-CoV-2 by FDA under an Emergency Use Authorization (EUA).  This EUA will remain in effect (meaning this test can be used) for the duration of the COVID-19 declaration under Section 564(b)(1) of the Act, 21 U.S.C. section 360bbb-3(b)(1), unless the authorization is terminated or revoked sooner.  Performed at [facility], [address] [place], [place] [address]   Culture, blood (Routine X 2) w Reflex to ID Panel     Status: None (Preliminary result)   Collection Time: 02/17/20  9:01 AM   Specimen: BLOOD  Result Value Ref Range Status   Specimen Description BLOOD BLOOD LEFT FOREARM  Final   Special Requests   Final     BOTTLES DRAWN AEROBIC AND ANAEROBIC Blood Culture adequate volume   Culture   Final    NO GROWTH 3 DAYS Performed at [facility], [address] [place], [place] [address]    Report Status PENDING  Incomplete  Culture, blood (Routine X 2) w Reflex to ID Panel     Status: None (Preliminary result)   Collection Time: 02/17/20  9:01 AM   Specimen: BLOOD  Result Value Ref Range Status   Specimen Description BLOOD BLOOD RIGHT FOREARM  Final   Special Requests   Final    BOTTLES DRAWN AEROBIC AND ANAEROBIC Blood Culture adequate volume   Culture   Final    NO GROWTH 3 DAYS Performed at [facility], [address], [place], [place] [address]    Report Status PENDING  Incomplete         Radiology Studies: No results found.      Scheduled Meds: . apixaban  5 mg Oral BID  . aspirin EC  81 mg Oral Daily  . atorvastatin  80 mg Oral Daily  . carvedilol  25 mg Oral BID WC  . colchicine  0.6 mg Oral Daily  . furosemide  40 mg Intravenous BID  . lisinopril  20 mg Oral Daily  . methylPREDNISolone (SOLU-MEDROL) injection  20 mg Intravenous Q24H  . nicotine  21 mg Transdermal Daily  . potassium chloride  40 mEq Oral BID  . sodium chloride flush  3 mL Intravenous Q12H   Continuous Infusions: . sodium chloride 50 mL (02/19/20 0851)     LOS: 3 days    Time spent: 34 mins    Charise Killian, MD [facility] Pager [phone]  If 7PM-7AM, please contact night-coverage [email or web address] 02/20/2020, 1:08 PM

## 2020-02-20 NOTE — Progress Notes (Signed)
Mobility Specialist - Progress Note   02/20/20 1215  Mobility  Activity Ambulated in room;Ambulated in hall  Level of Assistance Independent  Assistive Device None  Distance Ambulated (ft) 340 ft  Mobility Response Tolerated well  Mobility performed by Mobility specialist  $Mobility charge 1 Mobility    Pre-mobility: 70 HR, 133/88 BP, 96% SpO2 Post-mobility: 72 HR, 126/109 BP, 94% SpO2   Pt was sitting in a chair upon arrival. Pt agreed to mobility session. Pt is independent w/ mobility. Pt ambulated 340' total in room and hallway w/ no AD. CGA utilized for safety precautions. Pt did c/o feet feeling sore during session. After completing 2 laps around nursing station, pt stated he "started feeling fatigue in my feet". Overall, pt tolerated session well. Pt left sitting in chair w/ lunch tray in front of him. Nurse was notified.   Rollins Wrightson Mobility Specialist  02/20/20, 12:19 PM

## 2020-02-20 NOTE — TOC Initial Note (Signed)
Transition of Care [facility]) - Initial/Assessment Note    Patient Details  Name: Cameron [name] MRN: [id number] Date of Birth: [birth year]  Transition of Care [facility]) CM/SW Contact:    Shawn Route, RN Phone Number: 02/20/2020, 10:45 AM  Clinical Narrative:                  Patient lives at home, no need for [facility] at this time, but recommendation for RW from PT.  Order placed with [employer].  To be delivered to room.  Referral sent to Heart Failure Clinic for follow up appt.  Expected Discharge Plan: Home/Self Care Barriers to Discharge: Continued Medical Work up   Patient Goals and CMS Choice     Choice offered to / list presented to : Patient  Expected Discharge Plan and Services Expected Discharge Plan: Home/Self Care   Discharge Planning Services: CM Consult, HF Clinic Post Acute Care Choice:  (Heart Failure Clinic) Living arrangements for the past 2 months: Single Family Home                 DME Arranged: Walker rolling DME Agency:  [facility]) Date DME Agency Contacted: 02/20/20 Time DME Agency Contacted: 1044 Representative spoke with at DME Agency: Vaughan Basta            Prior Living Arrangements/Services Living arrangements for the past 2 months: Single Family Home     Do you feel safe going back to the place where you live?: Yes      Need for Family Participation in Patient Care: No (Comment) Care giver support system in place?: No (comment)   Criminal Activity/Legal Involvement Pertinent to Current Situation/Hospitalization: No - Comment as needed  Activities of Daily Living Home Assistive Devices/Equipment: Crutches ADL Screening (condition at time of admission) Patient's cognitive ability adequate to safely complete daily activities?: Yes Is the patient deaf or have difficulty hearing?: No Does the patient have difficulty seeing, even when wearing glasses/contacts?: No Does the patient have difficulty concentrating, remembering, or making decisions?:  No Patient able to express need for assistance with ADLs?: Yes Does the patient have difficulty dressing or bathing?: No Independently performs ADLs?: Yes (appropriate for developmental age) Does the patient have difficulty walking or climbing stairs?: No Weakness of Legs: None Weakness of Arms/Hands: None  Permission Sought/Granted                  Emotional Assessment Appearance:: Appears stated age Attitude/Demeanor/Rapport: Engaged, Self-Confident Affect (typically observed): Accepting, Appropriate Orientation: : Oriented to Self, Oriented to Place, Oriented to  Time, Oriented to Situation Alcohol / Substance Use: Not Applicable Psych Involvement: No (comment)  Admission diagnosis:  Hypokalemia [E87.6] Ventricular tachycardia (HCC) [I47.2] Acute on chronic systolic CHF (congestive heart failure) (HCC) [I50.23] Septic bursitis of elbow, left [M71.122] Atrial fibrillation, unspecified type (HCC) [I48.91] Acute on chronic congestive heart failure, unspecified heart failure type [facility]) [I50.9] Patient Active Problem List   Diagnosis Date Noted  . Acute on chronic systolic CHF (congestive heart failure) (HCC) 02/17/2020  . Hypokalemia 02/17/2020  . Hypomagnesemia 02/17/2020  . Elevated troponin 02/17/2020  . New onset atrial fibrillation (HCC) 02/17/2020  . Olecranon bursitis of left elbow 02/17/2020  . NSVT (nonsustained ventricular tachycardia) (HCC) 02/17/2020  . Tobacco abuse 02/17/2020  . Apical mural thrombus 02/17/2020  . Gout_left elbow 02/17/2020  . Chest pain 02/02/2018  . Essential hypertension 06/20/2016  . Hyperlipidemia 06/20/2016  . CAD (coronary artery disease), native coronary artery 06/20/2016  . STEMI involving oth coronary  artery of anterior wall (HCC) 06/17/2016  . STEMI (ST elevation myocardial infarction) (HCC) 06/17/2016   PCP:  Patient, No Pcp Per Pharmacy:   [facility] [phone] Dan Humphreys, [place] - [address] [place]  [address] Phone: [phone] Fax: [phone]     Social Determinants of Health (SDOH) Interventions    Readmission Risk Interventions No flowsheet data found.

## 2020-02-20 NOTE — Progress Notes (Signed)
[facility]    SUBJECTIVE: The patient reports that his breathing is back to baseline. He reports occasional "slight" chest pain that feels like heart burn that resolve with position changes. He denies palpitations or heart racing. He states that his peripheral edema has not improved.   Vitals:   02/19/20 1616 02/19/20 1954 02/20/20 0641 02/20/20 0809  BP: (!) 141/109 (!) 119/96 (!) 137/100 (!) 141/107  Pulse: 86 82 74 72  Resp:    18  Temp: 97.7 F (36.5 C) 98.4 F (36.9 C) (!) 97.5 F (36.4 C) 98 F (36.7 C)  TempSrc: Oral Oral Oral Oral  SpO2: 96% 98% 98% 95%  Weight:   90.6 kg   Height:         Intake/Output Summary (Last 24 hours) at 02/20/2020 6237 Last data filed at 02/20/2020 0809 Gross per 24 hour  Intake 600 ml  Output 850 ml  Net -250 ml      PHYSICAL EXAM  General: Well developed, well nourished, in no acute distress, lying in bed  HEENT:  Normocephalic and atramatic Neck:  No JVD.   Lungs: diminished breath sounds throughout, no wheezing, no accessory respiratory muscle use. Heart: irregularly irregular, no gallops or murmurs. Abdomen: no obvious distention Msk:  Gait not assessed. Back normal. Normal strength and tone for age. Extremities: Bilateral pitting edema, L>R Neuro: Alert and oriented X 3. Psych:  Good affect, responds appropriately   LABS: Basic Metabolic Panel: Recent Labs    02/18/20 0811 02/19/20 0525  NA 142 142  K 2.7* 3.2*  CL 92* 95*  CO2 38* 33*  GLUCOSE 140* 124*  BUN 20 25*  CREATININE 0.91 1.02  CALCIUM 8.5* 8.7*  MG 1.8  --    Liver Function Tests: No results for input(s): AST, ALT, ALKPHOS, BILITOT, PROT, ALBUMIN in the last 72 hours. No results for input(s): LIPASE, AMYLASE in the last 72 hours. CBC: Recent Labs    02/19/20 0525 02/20/20 0729  WBC 11.5* 13.7*  HGB 14.2 14.2  HCT 43.1 44.8  MCV 112.2* 115.2*  PLT 183 187   Cardiac Enzymes: No results for input(s): CKTOTAL, CKMB, CKMBINDEX, TROPONINI in  the last 72 hours. BNP: Invalid input(s): POCBNP D-Dimer: No results for input(s): DDIMER in the last 72 hours. Hemoglobin A1C: Recent Labs    02/18/20 0811  HGBA1C 5.4   Fasting Lipid Panel: Recent Labs    02/18/20 0811  CHOL 108  HDL 39*  LDLCALC 52  TRIG 87  CHOLHDL 2.8   Thyroid Function Tests: Recent Labs    02/18/20 0811  TSH 0.479   Anemia Panel: No results for input(s): VITAMINB12, FOLATE, FERRITIN, TIBC, IRON, RETICCTPCT in the last 72 hours.  No results found.   Echo LVEF 20-25%, mild to moderate mitral regurgitation, moderately elevated pulmonary artery pressure  TELEMETRY: atrial fibrillation, 90s  ASSESSMENT AND PLAN:  Principal Problem:   Acute on chronic systolic CHF (congestive heart failure) (HCC) Active Problems:   Essential hypertension   Hyperlipidemia   CAD (coronary artery disease), native coronary artery   Hypokalemia   Hypomagnesemia   Elevated troponin   New onset atrial fibrillation (HCC)   Olecranon bursitis of left elbow   NSVT (nonsustained ventricular tachycardia) (HCC)   Tobacco abuse   Apical mural thrombus   Gout_left elbow    1. Acute on chronic systolic CHF, with underlying ischemic cardiomyopathy with LVEF 20-25%, presenting with recent history of progressive peripheral edema and weight gain with BNP 2228 and  chest xray revealing mild pulmonary vascular congestion. Peripheral edema not much improved, diminished breath sounds, weight up 4 pounds from admission. Renal function stable. 2. Coronary artery disease, status post stent bifurcation LAD stent-D1 and left circumflex 2006, as post anterior STEMI, DES LAD 06/17/2016 with occluded left circumflex, 75% stenosis distal RCA, currently without significant chest pain. 3. New onset atrial fibrillation in the setting of hypokalemia and acute on chronic systolic congestive heart failure, rate controlled, chads vasc score 3, started on Eliquis. 4. Hypokalemia, admission  potassium 2.3, received IV and p.o. potassium, repeat potassium 3.2; awaiting repeat labs this morning 5.Essential hypertension, systolic blood pressure mildly elevated 6.  Left olecranon bursitis, status post aspiration, possible gout, symptomatically improved on IV steroids and antibiotics. 7. Tobacco abuse, on Nicotine patch  Recommendations: 1. Increase Lasix to 40 mg BID with careful monitoring of renal status, I&Os and electrolytes 2. Continue carvedilol 25 mg BID (HTN and rate control) and lisinopril 20 mg daily. 3. Continue Eliquis 5 mg BID for stroke prevention 4. Encourage tobacco cessation; Nicotine patch prescription at discharge. 5. Supplement potassium    Leanora Ivanoff, PA-C 02/20/2020 8:21 AM

## 2020-02-21 LAB — CBC
HCT: 44.3 % (ref 39.0–52.0)
Hemoglobin: 14.6 g/dL (ref 13.0–17.0)
MCH: 37 pg — ABNORMAL HIGH (ref 26.0–34.0)
MCHC: 33 g/dL (ref 30.0–36.0)
MCV: 112.2 fL — ABNORMAL HIGH (ref 80.0–100.0)
Platelets: 181 10*3/uL (ref 150–400)
RBC: 3.95 MIL/uL — ABNORMAL LOW (ref 4.22–5.81)
RDW: 15.7 % — ABNORMAL HIGH (ref 11.5–15.5)
WBC: 11.1 10*3/uL — ABNORMAL HIGH (ref 4.0–10.5)
nRBC: 0 % (ref 0.0–0.2)

## 2020-02-21 LAB — BASIC METABOLIC PANEL
Anion gap: 10 (ref 5–15)
BUN: 38 mg/dL — ABNORMAL HIGH (ref 6–20)
CO2: 35 mmol/L — ABNORMAL HIGH (ref 22–32)
Calcium: 8.9 mg/dL (ref 8.9–10.3)
Chloride: 95 mmol/L — ABNORMAL LOW (ref 98–111)
Creatinine, Ser: 1.14 mg/dL (ref 0.61–1.24)
GFR calc Af Amer: >60 mL/min (ref >60)
GFR calc non Af Amer: >60 mL/min (ref >60)
Glucose, Bld: 122 mg/dL — ABNORMAL HIGH (ref 70–99)
Potassium: 4 mmol/L (ref 3.5–5.1)
Sodium: 140 mmol/L (ref 135–145)

## 2020-02-21 MED ORDER — COLCHICINE 0.6 MG PO TABS: 0.6000 mg | ORAL_TABLET | Freq: Every day | ORAL | 0 refills | Status: DC

## 2020-02-21 MED ORDER — LISINOPRIL 20 MG PO TABS
40.0000 mg | ORAL_TABLET | Freq: Every day | ORAL | Status: DC
  Administered 2020-02-21: 40 mg via ORAL
  Filled 2020-02-21: qty 2

## 2020-02-21 MED ORDER — LISINOPRIL 20 MG PO TABS: 20.0000 mg | ORAL_TABLET | Freq: Every day | ORAL | 0 refills | Status: DC

## 2020-02-21 MED ORDER — FUROSEMIDE 40 MG PO TABS
40.0000 mg | ORAL_TABLET | Freq: Two times a day (BID) | ORAL | 0 refills | Status: DC
Start: 2020-02-21 — End: 2020-06-29

## 2020-02-21 MED ORDER — CARVEDILOL 25 MG PO TABS: 25.0000 mg | ORAL_TABLET | Freq: Two times a day (BID) | ORAL | 0 refills | Status: DC

## 2020-02-21 MED ORDER — NICOTINE 21 MG/24HR TD PT24: 21.0000 mg | MEDICATED_PATCH | Freq: Every day | TRANSDERMAL | 0 refills | Status: AC

## 2020-02-21 MED ORDER — APIXABAN 5 MG PO TABS: 5.0000 mg | ORAL_TABLET | Freq: Two times a day (BID) | ORAL | 0 refills | Status: DC

## 2020-02-21 MED ORDER — ATORVASTATIN CALCIUM 80 MG PO TABS: 80.0000 mg | ORAL_TABLET | Freq: Every day | ORAL | Status: AC

## 2020-02-21 MED ORDER — POTASSIUM CHLORIDE ER 10 MEQ PO TBCR: 10.0000 meq | EXTENDED_RELEASE_TABLET | Freq: Every day | ORAL | 0 refills | Status: DC

## 2020-02-21 NOTE — Progress Notes (Signed)
[facility]    SUBJECTIVE: The patient reports feeling better today, nearly back to baseline. He denies significant shortness of breath, chest pain, or palpitations. He reports that the swelling began to improve significantly with increased Lasix to 40 mg BID.    Vitals:   02/21/20 0247 02/21/20 0521 02/21/20 0624 02/21/20 0749  BP:  (!) 142/109 122/90 (!) 148/112  Pulse:  69 90 64  Resp:    19  Temp:  97.7 F (36.5 C)  97.9 F (36.6 C)  TempSrc:  Oral  Oral  SpO2:  95%  97%  Weight: 90.9 kg     Height:         Intake/Output Summary (Last 24 hours) at 02/21/2020 3159 Last data filed at 02/21/2020 0545 Gross per 24 hour  Intake 360 ml  Output 1050 ml  Net -690 ml      PHYSICAL EXAM  General: Well developed, well nourished, in no acute distress, sitting up in bed eating breakfast HEENT:  Normocephalic and atramatic Neck:  No JVD.  Lungs: normal effort of breathing on room air, mildly diminished bibasilar breaths ounds Heart: irregularly irregular, no murmurs  Abdomen: nondistended Msk:  Back normal, bil elbow deformity. Normal strength and tone for age. Extremities: 1+ bilateral pitting edema Neuro: Alert and oriented X 3. Psych:  Good affect, responds appropriately   LABS: Basic Metabolic Panel: Recent Labs    02/20/20 0729 02/21/20 0630  NA 141 140  K 3.3* 4.0  CL 92* 95*  CO2 34* 35*  GLUCOSE 130* 122*  BUN 32* 38*  CREATININE 1.15 1.14  CALCIUM 9.0 8.9   Liver Function Tests: No results for input(s): AST, ALT, ALKPHOS, BILITOT, PROT, ALBUMIN in the last 72 hours. No results for input(s): LIPASE, AMYLASE in the last 72 hours. CBC: Recent Labs    02/20/20 0729 02/21/20 0630  WBC 13.7* 11.1*  HGB 14.2 14.6  HCT 44.8 44.3  MCV 115.2* 112.2*  PLT 187 181   Cardiac Enzymes: No results for input(s): CKTOTAL, CKMB, CKMBINDEX, TROPONINI in the last 72 hours. BNP: Invalid input(s): POCBNP D-Dimer: No results for input(s): DDIMER in the last 72  hours. Hemoglobin A1C: No results for input(s): HGBA1C in the last 72 hours. Fasting Lipid Panel: No results for input(s): CHOL, HDL, LDLCALC, TRIG, CHOLHDL, LDLDIRECT in the last 72 hours. Thyroid Function Tests: No results for input(s): TSH, T4TOTAL, T3FREE, THYROIDAB in the last 72 hours.  Invalid input(s): FREET3 Anemia Panel: No results for input(s): VITAMINB12, FOLATE, FERRITIN, TIBC, IRON, RETICCTPCT in the last 72 hours.  No results found.   Echo EF 20-25%, mild to moderate MR  TELEMETRY: atrial fibrillation, rate 100 bpm  ASSESSMENT AND PLAN:  Principal Problem:   Acute on chronic systolic CHF (congestive heart failure) (HCC) Active Problems:   Essential hypertension   Hyperlipidemia   CAD (coronary artery disease), native coronary artery   Hypokalemia   Hypomagnesemia   Elevated troponin   New onset atrial fibrillation (HCC)   Olecranon bursitis of left elbow   NSVT (nonsustained ventricular tachycardia) (HCC)   Tobacco abuse   Apical mural thrombus   Gout_left elbow    1. Acute on chronic systolic CHF, with underlying ischemic cardiomyopathy with LVEF 20-25%, presenting with recent history of progressive peripheral edema and weight gain with BNP 2228 and chest xray revealing mild pulmonary vascular congestion. Peripheral edema not much improved with increased IV Lasix to 40 mg BID. Renal function stable. 2. Coronary artery disease, status post stent  bifurcation LAD stent-D1 and left circumflex 2006, as post anterior STEMI, DES LAD 06/17/2016 with occluded left circumflex, 75% stenosis distal RCA, currently without significant chest pain. 3. New onset atrial fibrillation in the setting of hypokalemia and acute on chronic systolic congestive heart failure, rate controlled, chads vasc score 3,started on Eliquis. Hypokalemia resolved. Patient remains in rate-controlled atrial fibrillation, asymptomatic. 4. Hypokalemia,admissionpotassium 2.3, receivedIV and p.o.  potassium,repeat potassium 4.0 today 5.Essential hypertension, blood pressure elevated this morning 6.Left olecranon bursitis, status post aspiration, possible gout, symptomatically improved on IV steroids and antibiotics. 7. Tobacco abuse, on Nicotine patch   Recommendations: 1. Switch to oral Lasix 40 mg BID 2. Continue carvedilol 25 mg BID and lisinopril 20 mg daily 3. Continue Eliquis 5 mg BID for stroke prevention 4. Encourage tobacco cessation and Nicotine patch prescription at discharge 5. Recommend discharge today with Heart Failure referral and follow-up with Dr. [name] next week. Patient will need repeat BMP at follow-up appointment.   Cameron Ivanoff, PA-C 02/21/2020 8:33 AM

## 2020-02-21 NOTE — Discharge Summary (Signed)
Physician Discharge Summary  KYLAN LIBERATI GEX:[id number] DOB: Oct 05, 1960 DOA: 02/17/2020  PCP: Patient, No Pcp Per  Admit date: 02/17/2020 Discharge date: 02/21/2020  Admitted From: home Disposition: home  Recommendations for Outpatient Follow-up:  1. Follow up with PCP in 1-2 weeks 2. F/u cardio in 1 week   Home Health: no Equipment/Devices: walker  Discharge Condition: stable CODE STATUS: full  Diet recommendation: Heart Healthy   Brief/Interim Summary: HPI was taken from Dr. Clyde Lundborg: LORAS GRIESHOP is a 59 y.o. male with medical history significant of hypertension, hyperlipidemia, CAD, STEMI, sCHF with EF of 20-25%, tobacco abuse, apical mural thrombus, who presents with worsening bilateral leg edema and left elbow pain.  Patient states that he has worsening bilateral leg edema in the past several days.  No cough, chest pain or worsening shortness of breath.  No fever or chills.  Patient denies nausea vomiting, diarrhea, abdominal pain, symptoms of UTI or unilateral weakness. Patient also has left elbow pain which has been going for several days.  Pain is constant, moderate, sharp, nonradiating. Patient reports that he is supposed to be on Eliquis for apical mural thrombus, but stopped taking it because he used to bruise up very easily.   Patient was found to have new onset atrial fibrillation with heart rate in the 90s in ED. EDP collected diagnostic bursa fluid from left elbow for crystal and culture analysis, which showed turbid appearance, extracellular and intracellular monosodium urate crystals.   ED Course: pt was found to have WBC 9.1, BNP 2228, troponin I 76, negative Covid PCR, uric acid 11, potassium 2.3, renal function okay, temperature normal, blood pressure 141/90, heart rate 95, RR 18, oxygen saturation 98% on room air.  Chest x-ray with cardiomegaly and mild vascular congestion.  Patient is admitted to progressive bed as inpatient.  Dr. Joice Lofts of Ortho is  consulted.  Dr. Darrold Junker of cardiology is consulted.  Hospital Course from Dr. Wilfred Lacy 8/13-8/17/21: Pt presented w/ shortness of breath and left elbow pain. Pt was treated for acute on chronic systolic CHF exacerbation w/ IV lasix, carvedilol, lisinopril. Pt responded well to above and below stated treatment. Furthermore, pt was found to have gout (likely) of the left elbow. A synovial fluid cx was taken by ortho surg. Synovial fluid was NGTD so abxs were d/c. Pt did received IV steroids and colchicine for likely gout flare. Of note, pt was found to have new onset a. fib and was started on eliquis. For more information, please see other progress notes.   Discharge Diagnoses:  Principal Problem:   Acute on chronic systolic CHF (congestive heart failure) (HCC) Active Problems:   Essential hypertension   Hyperlipidemia   CAD (coronary artery disease), native coronary artery   Hypokalemia   Hypomagnesemia   Elevated troponin   New onset atrial fibrillation (HCC)   Olecranon bursitis of left elbow   NSVT (nonsustained ventricular tachycardia) (HCC)   Tobacco abuse   Apical mural thrombus   Gout_left elbow    Acute on chronic systolic CHF:  echo shows EF 20-25%. Increased IV lasix dose & frequency. Monitor I/Os & daily weights. Neg fluid balance but less than 1L. Echo shows EF 20-25%, diastolic function indeterminate, no wall motion abnormalities. Cardio following and recs apprec   A. fib: new onset. CHADsVASc score of 3. Continue on coreg & eliquis   Gout: of left elbow and possible olecranon bursitis of left elbow. Started on IV solumedrol taper & continue colchicine. D/c rocephin & will monitor.  Synovial fluid cx NGTD. Ortho surg following and recs apprec  Apical mural thrombus: per cardio "previously on Eliquis, which patient discontinued due to easy bruising. Echocardiogram in 01/2018 and 10/2018 did not reveal evidence of thrombus".  HTN: continue on coreg, lisinopril. IV  hydralazine prn   Hyperlipidemia: continue on statin   Elevated troponin: likely secondary to demand ischemia.  Hx of CAD.   Hypokalemia: potassium repleted again today. Will continue to monitor   Hypomagnesemia:  WNL today. Will continue to monitor   Tobacco abuse: nicotine patch to prevent w/drawal. Smoking cessation counseling   Discharge Instructions  Discharge Instructions    Diet - low sodium heart healthy   Complete by: As directed    Discharge instructions   Complete by: As directed    F/U PCP in 2 weeks. F/u cardio, Dr. [name], in 1 week   Increase activity slowly   Complete by: As directed      Allergies as of 02/21/2020   No Known Allergies     Medication List    TAKE these medications   apixaban 5 MG Tabs tablet Commonly known as: ELIQUIS Take 1 tablet (5 mg total) by mouth 2 (two) times daily.   atorvastatin 80 MG tablet Commonly known as: LIPITOR Take 1 tablet (80 mg total) by mouth daily.   carvedilol 25 MG tablet Commonly known as: COREG Take 1 tablet (25 mg total) by mouth 2 (two) times daily with a meal.   colchicine 0.6 MG tablet Take 1 tablet (0.6 mg total) by mouth daily. Start taking on: February 22, 2020   furosemide 40 MG tablet Commonly known as: Lasix Take 1 tablet (40 mg total) by mouth 2 (two) times daily. What changed:   medication strength  how much to take   lisinopril 20 MG tablet Commonly known as: ZESTRIL Take 1 tablet (20 mg total) by mouth daily.   nicotine 21 mg/24hr patch Commonly known as: NICODERM CQ - dosed in mg/24 hours Place 1 patch (21 mg total) onto the skin daily. Start taking on: February 22, 2020   nitroGLYCERIN 0.4 MG SL tablet Commonly known as: Nitrostat Place 1 tablet (0.4 mg total) under the tongue every 5 (five) minutes as needed.   ondansetron 4 MG disintegrating tablet Commonly known as: ZOFRAN-ODT Take 4 mg by mouth every 8 (eight) hours as needed for nausea.   potassium chloride 10 MEQ  tablet Commonly known as: KLOR-CON Take 1 tablet (10 mEq total) by mouth daily.   Ventolin HFA 108 (90 Base) MCG/ACT inhaler Generic drug: albuterol Inhale 2 puffs into the lungs every 6 (six) hours as needed for wheezing or shortness of breath.            Durable Medical Equipment  (From admission, onward)         Start     Ordered   02/20/20 0804  For home use only DME Walker rolling  Once       Question Answer Comment  Walker: With 5 Inch Wheels   Patient needs a walker to treat with the following condition Generalized weakness      02/20/20 0803          Follow-up Information    [facility] Follow up on 03/06/2020.   Specialty: Cardiology Why: at 9:00am. Enter through the Medical Mall entrance Contact information: [address] [place] [address] [phone]       Lamar Blinks, MD  On 02/27/2020.   Specialty: Cardiology Why: Appointment at 11:45am Contact information: [address] [facility] [place] [address] [phone]              No Known Allergies  Consultations:  Cardio, Dr. Darrold Junker  Ortho surg, Dr. Joice Lofts   Procedures/Studies: DG Chest Portable 1 View  Result Date: 02/17/2020 CLINICAL DATA:  Congestive heart failure. Bilateral lower extremity and left arm swelling. EXAM: PORTABLE CHEST 1 VIEW COMPARISON:  Two-view chest x-ray 02/01/2018 FINDINGS: The heart is enlarged. Mild pulmonary vascular congestion is present. No focal airspace disease present. The visualized soft tissues bony thorax are unremarkable. IMPRESSION: Cardiomegaly and mild pulmonary vascular congestion without frank edema. Electronically Signed   By: Marin Roberts M.D.   On: 02/17/2020 07:15   ECHOCARDIOGRAM COMPLETE  Result Date: 02/18/2020    ECHOCARDIOGRAM REPORT   Patient Name:   TATSUYA OKRAY Vine Date of Exam: 02/17/2020 Medical Rec #:  [id number]          Height:       67.0 in Accession #:    [id number]        Weight:       195.5 lb Date of Birth:  1960/08/07         BSA:          2.003 m Patient Age:    59 years          BP:           115/87 mmHg Patient Gender: M                 HR:           87 bpm. Exam Location:  [facility] Procedure: 2D Echo Indications:     CHF-Acute Systolic 428.21 / I50.21  History:         Patient has prior history of Echocardiogram examinations, most                  recent 02/02/2018. CHF; Risk Factors:Dyslipidemia, Current                  Smoker and Hypertension.  Sonographer:     Johnathan Hausen Referring Phys:  Wynona Neat NIU Diagnosing Phys: Marcina Millard MD IMPRESSIONS  1. Left ventricular ejection fraction, by estimation, is 20 to 25%. The left ventricle has severely decreased function. The left ventricle has no regional wall motion abnormalities. The left ventricular internal cavity size was mildly to moderately dilated. Left ventricular diastolic parameters are indeterminate.  2. Right ventricular systolic function is normal. The right ventricular size is normal. There is moderately elevated pulmonary artery systolic pressure.  3. Left atrial size was mildly dilated.  4. The mitral valve is normal in structure. Mild to moderate mitral valve regurgitation. No evidence of mitral stenosis.  5. The aortic valve is normal in structure. Aortic valve regurgitation is not visualized. No aortic stenosis is present.  6. The inferior vena cava is normal in size with greater than 50% respiratory variability, suggesting right atrial pressure of 3 mmHg. FINDINGS  Left Ventricle: Left ventricular ejection fraction, by estimation, is 20 to 25%. The left ventricle has severely decreased function. The left ventricle has no regional wall motion abnormalities. Definity contrast agent was given IV to delineate the left  ventricular endocardial borders. The left ventricular internal cavity size was mildly to moderately dilated. There is no left  ventricular hypertrophy. Left ventricular diastolic parameters are indeterminate. Right Ventricle: The right ventricular  size is normal. No increase in right ventricular wall thickness. Right ventricular systolic function is normal. There is moderately elevated pulmonary artery systolic pressure. The tricuspid regurgitant velocity is 3.35 m/s, and with an assumed right atrial pressure of 10 mmHg, the estimated right ventricular systolic pressure is 54.9 mmHg. Left Atrium: Left atrial size was mildly dilated. Right Atrium: Right atrial size was normal in size. Pericardium: There is no evidence of pericardial effusion. Mitral Valve: The mitral valve is normal in structure. Normal mobility of the mitral valve leaflets. Mild to moderate mitral valve regurgitation. No evidence of mitral valve stenosis. Tricuspid Valve: The tricuspid valve is normal in structure. Tricuspid valve regurgitation is mild . No evidence of tricuspid stenosis. Aortic Valve: The aortic valve is normal in structure. Aortic valve regurgitation is not visualized. No aortic stenosis is present. Pulmonic Valve: The pulmonic valve was normal in structure. Pulmonic valve regurgitation is not visualized. No evidence of pulmonic stenosis. Aorta: The aortic root is normal in size and structure. Venous: The inferior vena cava is normal in size with greater than 50% respiratory variability, suggesting right atrial pressure of 3 mmHg. IAS/Shunts: No atrial level shunt detected by color flow Doppler.  LEFT VENTRICLE PLAX 2D LVIDd:         5.35 cm LVIDs:         4.83 cm LV PW:         0.68 cm LV IVS:        0.77 cm LVOT diam:     1.90 cm LVOT Area:     2.84 cm  LV Volumes (MOD) LV vol d, MOD A4C: 150.0 ml LV vol s, MOD A4C: 120.0 ml LV SV MOD A4C:     150.0 ml IVC IVC diam: 2.22 cm LEFT ATRIUM             Index       RIGHT ATRIUM           Index LA diam:        5.20 cm 2.60 cm/m  RA Area:     24.00 cm LA Vol (A2C):   80.2 ml 40.04 ml/m RA Volume:   79.10 ml   39.50 ml/m LA Vol (A4C):   99.7 ml 49.78 ml/m LA Biplane Vol: 92.9 ml 46.39 ml/m   AORTA Ao Root diam: 3.80 cm MR Peak grad: 103.6 mmHg  TRICUSPID VALVE MR Mean grad: 70.0 mmHg   TR Peak grad:   44.9 mmHg MR Vmax:      509.00 cm/s TR Vmax:        335.00 cm/s MR Vmean:     403.0 cm/s                           SHUNTS                           Systemic Diam: 1.90 cm Marcina Millard MD Electronically signed by Marcina Millard MD Signature Date/Time: 02/18/2020/12:26:26 PM    Final       Subjective: Pt c/o left elbow pain, improved from day prior    Discharge Exam: Vitals:   02/21/20 0749 02/21/20 1000  BP: (!) 148/112 (!) 142/95  Pulse: 64   Resp: 19   Temp: 97.9 F (36.6 C)   SpO2: 97%    Vitals:   02/21/20 0521 02/21/20 0624 02/21/20 0749 02/21/20 1000  BP: (!) 142/109 122/90 (!) 148/112 Marland Kitchen)  142/95  Pulse: 69 90 64   Resp:   19   Temp: 97.7 F (36.5 C)  97.9 F (36.6 C)   TempSrc: Oral  Oral   SpO2: 95%  97%   Weight:      Height:        General: Pt is alert, awake, not in acute distress Cardiovascular:  S1/S2 +, no rubs, no gallops Respiratory: CTA bilaterally, no wheezing, no rhonchi Abdominal: Soft, NT, obese, bowel sounds + Extremities: no cyanosis    The results of significant diagnostics from this hospitalization (including imaging, microbiology, ancillary and laboratory) are listed below for reference.     Microbiology: Recent Results (from the past 240 hour(s))  Body fluid culture     Status: None   Collection Time: 02/17/20  6:53 AM   Specimen: Synovium; Body Fluid  Result Value Ref Range Status   Specimen Description   Final    SYNOVIAL ELBOW Performed at [facility], [address], [place], [place] [address]    Special Requests   Final    NONE Performed at [facility], [address] [place], [place] [address]    Gram Stain NO WBC SEEN NO ORGANISMS SEEN   Final   Culture   Final    NO GROWTH 3 DAYS Performed at  [facility], [address], [place], [place] [address]    Report Status 02/20/2020 FINAL  Final  SARS Coronavirus 2 by RT PCR (hospital order, performed in [facility] hospital lab) Nasopharyngeal Nasopharyngeal Swab     Status: None   Collection Time: 02/17/20  7:16 AM   Specimen: Nasopharyngeal Swab  Result Value Ref Range Status   SARS Coronavirus 2 NEGATIVE NEGATIVE Final    Comment: (NOTE) SARS-CoV-2 target nucleic acids are NOT DETECTED.  The SARS-CoV-2 RNA is generally detectable in upper and lower respiratory specimens during the acute phase of infection. The lowest concentration of SARS-CoV-2 viral copies this assay can detect is 250 copies / mL. A negative result does not preclude SARS-CoV-2 infection and should not be used as the sole basis for treatment or other patient management decisions.  A negative result may occur with improper specimen collection / handling, submission of specimen other than nasopharyngeal swab, presence of viral mutation(s) within the areas targeted by this assay, and inadequate number of viral copies (<250 copies / mL). A negative result must be combined with clinical observations, patient history, and epidemiological information.  Fact Sheet for Patients:   BoilerBrush.com.cy  Fact Sheet for Healthcare Providers: https://pope.com/  This test is not yet approved or  cleared by the [place] FDA and has been authorized for detection and/or diagnosis of SARS-CoV-2 by FDA under an Emergency Use Authorization (EUA).  This EUA will remain in effect (meaning this test can be used) for the duration of the COVID-19 declaration under Section 564(b)(1) of the Act, 21 U.S.C. section 360bbb-3(b)(1), unless the authorization is terminated or revoked sooner.  Performed at [facility], [address] [place], [place] [address]   Culture, blood (Routine X 2) w Reflex to ID Panel      Status: None (Preliminary result)   Collection Time: 02/17/20  9:01 AM   Specimen: BLOOD  Result Value Ref Range Status   Specimen Description BLOOD BLOOD LEFT FOREARM  Final   Special Requests   Final    BOTTLES DRAWN AEROBIC AND ANAEROBIC Blood Culture adequate volume   Culture   Final  NO GROWTH 4 DAYS Performed at [facility], [address] [place], [place] [address]    Report Status PENDING  Incomplete  Culture, blood (Routine X 2) w Reflex to ID Panel     Status: None (Preliminary result)   Collection Time: 02/17/20  9:01 AM   Specimen: BLOOD  Result Value Ref Range Status   Specimen Description BLOOD BLOOD RIGHT FOREARM  Final   Special Requests   Final    BOTTLES DRAWN AEROBIC AND ANAEROBIC Blood Culture adequate volume   Culture   Final    NO GROWTH 4 DAYS Performed at [facility], [address] [place], [place] [address]    Report Status PENDING  Incomplete     Labs: BNP (last 3 results) Recent Labs    02/17/20 0526  BNP 2,228.8*   Basic Metabolic Panel: Recent Labs  Lab 02/17/20 0526 02/18/20 0811 02/19/20 0525 02/20/20 0729 02/21/20 0630  NA 142 142 142 141 140  K 2.3* 2.7* 3.2* 3.3* 4.0  CL 91* 92* 95* 92* 95*  CO2 37* 38* 33* 34* 35*  GLUCOSE 128* 140* 124* 130* 122*  BUN 14 20 25* 32* 38*  CREATININE 1.00 0.91 1.02 1.15 1.14  CALCIUM 8.3* 8.5* 8.7* 9.0 8.9  MG 1.4* 1.8  --   --   --    Liver Function Tests: Recent Labs  Lab 02/17/20 0526  AST 14*  ALT 7  ALKPHOS 66  BILITOT 1.0  PROT 6.8  ALBUMIN 3.3*   No results for input(s): LIPASE, AMYLASE in the last 168 hours. No results for input(s): AMMONIA in the last 168 hours. CBC: Recent Labs  Lab 02/17/20 0526 02/18/20 0811 02/19/20 0525 02/20/20 0729 02/21/20 0630  WBC 9.1 9.3 11.5* 13.7* 11.1*  NEUTROABS 6.4  --   --   --   --   HGB 14.1 14.4 14.2 14.2 14.6  HCT 42.7 43.5 43.1 44.8 44.3  MCV 112.1* 113.0* 112.2* 115.2* 112.2*  PLT 189 190 183 187 181    Cardiac Enzymes: No results for input(s): CKTOTAL, CKMB, CKMBINDEX, TROPONINI in the last 168 hours. BNP: Invalid input(s): POCBNP CBG: No results for input(s): GLUCAP in the last 168 hours. D-Dimer No results for input(s): DDIMER in the last 72 hours. Hgb A1c No results for input(s): HGBA1C in the last 72 hours. Lipid Profile No results for input(s): CHOL, HDL, LDLCALC, TRIG, CHOLHDL, LDLDIRECT in the last 72 hours. Thyroid function studies No results for input(s): TSH, T4TOTAL, T3FREE, THYROIDAB in the last 72 hours.  Invalid input(s): FREET3 Anemia work up No results for input(s): VITAMINB12, FOLATE, FERRITIN, TIBC, IRON, RETICCTPCT in the last 72 hours. Urinalysis    Component Value Date/Time   COLORURINE RED (A) 03/09/2017 1132   APPEARANCEUR TURBID (A) 03/09/2017 1132   APPEARANCEUR Hazy 11/20/2011 1343   LABSPEC 1.027 03/09/2017 1132   LABSPEC 1.026 11/20/2011 1343   PHURINE  03/09/2017 1132    TEST NOT REPORTED DUE TO COLOR INTERFERENCE OF URINE PIGMENT   GLUCOSEU (A) 03/09/2017 1132    TEST NOT REPORTED DUE TO COLOR INTERFERENCE OF URINE PIGMENT   GLUCOSEU Negative 11/20/2011 1343   HGBUR (A) 03/09/2017 1132    TEST NOT REPORTED DUE TO COLOR INTERFERENCE OF URINE PIGMENT   BILIRUBINUR (A) 03/09/2017 1132    TEST NOT REPORTED DUE TO COLOR INTERFERENCE OF URINE PIGMENT   BILIRUBINUR Negative 11/20/2011 1343   KETONESUR (A) 03/09/2017 1132    TEST NOT REPORTED DUE TO COLOR  INTERFERENCE OF URINE PIGMENT   PROTEINUR (A) 03/09/2017 1132    TEST NOT REPORTED DUE TO COLOR INTERFERENCE OF URINE PIGMENT   NITRITE (A) 03/09/2017 1132    TEST NOT REPORTED DUE TO COLOR INTERFERENCE OF URINE PIGMENT   LEUKOCYTESUR (A) 03/09/2017 1132    TEST NOT REPORTED DUE TO COLOR INTERFERENCE OF URINE PIGMENT   LEUKOCYTESUR Negative 11/20/2011 1343   Sepsis Labs Invalid input(s): PROCALCITONIN,  WBC,  LACTICIDVEN Microbiology Recent Results (from the past 240 hour(s))  Body fluid  culture     Status: None   Collection Time: 02/17/20  6:53 AM   Specimen: Synovium; Body Fluid  Result Value Ref Range Status   Specimen Description   Final    SYNOVIAL ELBOW Performed at [facility], [address], [place], [place] [address]    Special Requests   Final    NONE Performed at [facility], [address] [place], [place] [address]    Gram Stain NO WBC SEEN NO ORGANISMS SEEN   Final   Culture   Final    NO GROWTH 3 DAYS Performed at [facility], [address] [place], [place] [address]    Report Status 02/20/2020 FINAL  Final  SARS Coronavirus 2 by RT PCR (hospital order, performed in [facility] hospital lab) Nasopharyngeal Nasopharyngeal Swab     Status: None   Collection Time: 02/17/20  7:16 AM   Specimen: Nasopharyngeal Swab  Result Value Ref Range Status   SARS Coronavirus 2 NEGATIVE NEGATIVE Final    Comment: (NOTE) SARS-CoV-2 target nucleic acids are NOT DETECTED.  The SARS-CoV-2 RNA is generally detectable in upper and lower respiratory specimens during the acute phase of infection. The lowest concentration of SARS-CoV-2 viral copies this assay can detect is 250 copies / mL. A negative result does not preclude SARS-CoV-2 infection and should not be used as the sole basis for treatment or other patient management decisions.  A negative result may occur with improper specimen collection / handling, submission of specimen other than nasopharyngeal swab, presence of viral mutation(s) within the areas targeted by this assay, and inadequate number of viral copies (<250 copies / mL). A negative result must be combined with clinical observations, patient history, and epidemiological information.  Fact Sheet for Patients:   BoilerBrush.com.cy  Fact Sheet for Healthcare Providers: https://pope.com/  This test is not yet approved or  cleared by the [place] FDA and has been  authorized for detection and/or diagnosis of SARS-CoV-2 by FDA under an Emergency Use Authorization (EUA).  This EUA will remain in effect (meaning this test can be used) for the duration of the COVID-19 declaration under Section 564(b)(1) of the Act, 21 U.S.C. section 360bbb-3(b)(1), unless the authorization is terminated or revoked sooner.  Performed at [facility], [address] [place], [place] [address]   Culture, blood (Routine X 2) w Reflex to ID Panel     Status: None (Preliminary result)   Collection Time: 02/17/20  9:01 AM   Specimen: BLOOD  Result Value Ref Range Status   Specimen Description BLOOD BLOOD LEFT FOREARM  Final   Special Requests   Final    BOTTLES DRAWN AEROBIC AND ANAEROBIC Blood Culture adequate volume   Culture   Final    NO GROWTH 4 DAYS Performed at [facility], [address] [place], [place] [address]    Report Status PENDING  Incomplete  Culture, blood (Routine X 2) w Reflex to ID  Panel     Status: None (Preliminary result)   Collection Time: 02/17/20  9:01 AM   Specimen: BLOOD  Result Value Ref Range Status   Specimen Description BLOOD BLOOD RIGHT FOREARM  Final   Special Requests   Final    BOTTLES DRAWN AEROBIC AND ANAEROBIC Blood Culture adequate volume   Culture   Final    NO GROWTH 4 DAYS Performed at [facility], [address], [place], [place] [address]    Report Status PENDING  Incomplete     Time coordinating discharge: Over 30 minutes  SIGNED:   Charise Killian, MD  [facility] 02/21/2020, 1:28 PM Pager   If 7PM-7AM, please contact night-coverage [email or web address]

## 2020-02-21 NOTE — Progress Notes (Signed)
Discharge instructions explained/pt verbalized understanding. IV and tele removed. Will transport off unit via wheelchair.  

## 2020-02-22 LAB — CULTURE, BLOOD (ROUTINE X 2)
Culture: NO GROWTH
Culture: NO GROWTH
Special Requests: ADEQUATE
Special Requests: ADEQUATE

## 2020-02-23 ENCOUNTER — Telehealth: Payer: Self-pay | Admitting: Family

## 2020-02-23 NOTE — Telephone Encounter (Signed)
Unable to reach patient regarding his new patient CHF Clinic appointment and to see how he is doing since back home.    Aaleigha Bozza, NT

## 2020-03-05 NOTE — Progress Notes (Deleted)
   Patient ID: Cameron [name], male    DOB: [birth year], 59 y.o.   MRN: [id number]  HPI  [name] is a 59 y/o male with a history of  Echo report from 02/17/20 reviewed and showed an EF of 20-25% along with moderately elevated PA Pressure and mild/moderate MR.   Catheterization done 06/17/16 showed:  Prox Cx to Mid Cx lesion, 100 %stenosed.  Ost LAD to Mid LAD lesion, 100 %stenosed.  Ost 1st Diag to 1st Diag lesion, 100 %stenosed.  Ost RCA to Prox RCA lesion, 60 %stenosed.  Mid RCA lesion, 50 %stenosed.  Dist RCA lesion, 75 %stenosed.  There is moderate to severe left ventricular systolic dysfunction.  LV end diastolic pressure is mildly elevated.  The left ventricular ejection fraction is 25-35% by visual estimate.  Admitted 02/17/20 due to acute on chronic heart failure along with new onset atrial fibrillation. Cardiology and orthopaedic referrals made. Initially given IV lasix with transition to oral diuretics. Given IV solumedrol and colchicine due to gout in left elbow.    Discharged after 4 days.   He presents today for his initial visit with a chief complaint of  Review of Systems    Physical Exam    Assessment & Plan:  1: Chronic heart failure with reduced ejection fraction- - NYHA class - BNP 02/17/20 was 2228.8  2: HTN- - BP - saw PCP Cameron [name]) 02/08/20 - BMP 02/21/20 reviewed and showed sodium 140, potassium 4.0, creatinine 1.14 and GFR >60  3: Atrial fibrillation- - saw cardiology Cameron [name]) 03/02/20  4: Tobacco use-

## 2020-03-06 ENCOUNTER — Ambulatory Visit: Payer: Commercial Managed Care - PPO | Admitting: Family

## 2020-03-06 ENCOUNTER — Telehealth: Payer: Self-pay | Admitting: Family

## 2020-03-06 NOTE — Telephone Encounter (Signed)
Patient did not show for his [facility] appointment on 03/06/20. Will attempt to reschedule.

## 2020-06-18 ENCOUNTER — Other Ambulatory Visit (INDEPENDENT_AMBULATORY_CARE_PROVIDER_SITE_OTHER): Payer: Self-pay | Admitting: Vascular Surgery

## 2020-06-18 DIAGNOSIS — I739 Peripheral vascular disease, unspecified: Secondary | ICD-10-CM

## 2020-06-18 DIAGNOSIS — M79672 Pain in left foot: Secondary | ICD-10-CM

## 2020-06-19 ENCOUNTER — Telehealth (INDEPENDENT_AMBULATORY_CARE_PROVIDER_SITE_OTHER): Payer: Self-pay

## 2020-06-19 ENCOUNTER — Other Ambulatory Visit: Payer: Self-pay

## 2020-06-19 ENCOUNTER — Ambulatory Visit (INDEPENDENT_AMBULATORY_CARE_PROVIDER_SITE_OTHER): Payer: Commercial Managed Care - PPO | Admitting: Vascular Surgery

## 2020-06-19 ENCOUNTER — Encounter (INDEPENDENT_AMBULATORY_CARE_PROVIDER_SITE_OTHER): Payer: Self-pay | Admitting: Vascular Surgery

## 2020-06-19 ENCOUNTER — Ambulatory Visit (INDEPENDENT_AMBULATORY_CARE_PROVIDER_SITE_OTHER): Payer: Commercial Managed Care - PPO

## 2020-06-19 VITALS — BP 115/79 | HR 92 | Ht 67.0 in | Wt 182.0 lb

## 2020-06-19 DIAGNOSIS — I5023 Acute on chronic systolic (congestive) heart failure: Secondary | ICD-10-CM

## 2020-06-19 DIAGNOSIS — I739 Peripheral vascular disease, unspecified: Secondary | ICD-10-CM | POA: Diagnosis not present

## 2020-06-19 DIAGNOSIS — I70222 Atherosclerosis of native arteries of extremities with rest pain, left leg: Secondary | ICD-10-CM

## 2020-06-19 DIAGNOSIS — R600 Localized edema: Secondary | ICD-10-CM

## 2020-06-19 DIAGNOSIS — E785 Hyperlipidemia, unspecified: Secondary | ICD-10-CM | POA: Diagnosis not present

## 2020-06-19 DIAGNOSIS — M79672 Pain in left foot: Secondary | ICD-10-CM

## 2020-06-19 DIAGNOSIS — I70229 Atherosclerosis of native arteries of extremities with rest pain, unspecified extremity: Secondary | ICD-10-CM | POA: Insufficient documentation

## 2020-06-19 NOTE — Patient Instructions (Signed)
Peripheral Vascular Disease  Peripheral vascular disease (PVD) is a disease of the blood vessels that are not part of your heart and brain. A simple term for PVD is poor circulation. In most cases, PVD narrows the blood vessels that carry blood from your heart to the rest of your body. This can reduce the supply of blood to your arms, legs, and internal organs, like your stomach or kidneys. However, PVD most often affects a person's lower legs and feet. Without treatment, PVD tends to get worse. PVD can also lead to acute ischemic limb. This is when an arm or leg suddenly cannot get enough blood. This is a medical emergency. Follow these instructions at home: Lifestyle  Do not use any products that contain nicotine or tobacco, such as cigarettes and e-cigarettes. If you need help quitting, ask your doctor.  Lose weight if you are overweight. Or, stay at a healthy weight as told by your doctor.  Eat a diet that is low in fat and cholesterol. If you need help, ask your doctor.  Exercise regularly. Ask your doctor for activities that are right for you. General instructions  Take over-the-counter and prescription medicines only as told by your doctor.  Take good care of your feet: ? Wear comfortable shoes that fit well. ? Check your feet often for any cuts or sores.  Keep all follow-up visits as told by your doctor This is important. Contact a doctor if:  You have cramps in your legs when you walk.  You have leg pain when you are at rest.  You have coldness in a leg or foot.  Your skin changes.  You are unable to get or have an erection (erectile dysfunction).  You have cuts or sores on your feet that do not heal. Get help right away if:  Your arm or leg turns cold, numb, and blue.  Your arms or legs become red, warm, swollen, painful, or numb.  You have chest pain.  You have trouble breathing.  You suddenly have weakness in your face, arm, or leg.  You become very  confused or you cannot speak.  You suddenly have a very bad headache.  You suddenly cannot see. Summary  Peripheral vascular disease (PVD) is a disease of the blood vessels.  A simple term for PVD is poor circulation. Without treatment, PVD tends to get worse.  Treatment may include exercise, low fat and low cholesterol diet, and quitting smoking. This information is not intended to replace advice given to you by your health care provider. Make sure you discuss any questions you have with your health care provider. Document Revised: 06/05/2017 Document Reviewed: 07/31/2016 Elsevier Patient Education  2020 [employer]  

## 2020-06-19 NOTE — Telephone Encounter (Signed)
Spoke with the patient and he is scheduled with Dr. Wyn Quaker for a left leg angio on 06/25/20 with a 9:30 am arrival time to the [facility]. Covid testing is on 06/21/20 between 8-1 pm at the [facility]. Pre-procedure instructions were discussed and will be mailed.

## 2020-06-19 NOTE — Assessment & Plan Note (Signed)
Noninvasive studies today show falsely elevated ABIs likely due to medial calcification.  Although the ABIs are in the normal range at 1.2 on the right and 1.0 on the left, the waveforms on the right are good with a digital pressure of 83 but the waveforms on the left are quite dampened monophasic with a digital pressure of only 49 consistent with significant ischemia of the left lower extremity.  Recommend:  The patient has evidence of severe atherosclerotic changes of both lower extremities with rest pain that is associated with preulcerative changes and impending tissue loss of the foot.  This represents a limb threatening ischemia and places the patient at the risk for limb loss.  Patient should undergo angiography of the lower extremities with the hope for intervention for limb salvage.  The risks and benefits as well as the alternative therapies was discussed in detail with the patient.  All questions were answered.  Patient agrees to proceed with angiography.  The patient will follow up with me in the office after the procedure.

## 2020-06-19 NOTE — H&P (View-Only) (Signed)
Patient ID: Cameron [name], male   DOB: [birth year], 59 y.o.   MRN: [id number]  Chief Complaint  Patient presents with  . PAD  . New Patient (Initial Visit)    HPI Cameron [name] is a 59 y.o. male.  I am asked to see the patient by Dr. [name] for evaluation of PAD.  For the past year or so, he has noticed increasing pain and difficulty with walking predominantly in the left leg.  This is different than his chronic swelling which has been on and off and managed primarily with Lasix.  He has significant cardiac history and follows with cardiology.  He is also had several episodes of gout but this pain is different.  He is now having pain that wakes him at night.  He says he can only sleep about 90 to 120 minutes before having to get up and dangle his left foot.  The pain has crescendoed with walking and he can now only walk about 50 feet before having pain.  The right leg has some mild pain with activity, but the left leg remains the predominantly affected limb.  No open wounds or ulceration.  No fevers or chills.  There is no clear inciting event or causative factor that started the symptoms.   Noninvasive studies today show falsely elevated ABIs likely due to medial calcification.  Although the ABIs are in the normal range at 1.2 on the right and 1.0 on the left, the waveforms on the right are good with a digital pressure of 83 but the waveforms on the left are quite dampened monophasic with a digital pressure of only 49 consistent with significant ischemia of the left lower extremity.   Past Medical History:  Diagnosis Date  . CHF (congestive heart failure) (HCC)   . Coronary artery disease    a. 12/17 PCI with DES to pLAD with resdiual disease (RX therapy)  . Hyperlipidemia   . Hypertension   . STEMI (ST elevation myocardial infarction) (HCC) 2017    Past Surgical History:  Procedure Laterality Date  . CARDIAC CATHETERIZATION N/A 06/17/2016   Procedure: Left Heart Cath and  Coronary Angiography;  Surgeon: Runell Gess, MD;  Location: [facility] INVASIVE CV LAB;  Service: Cardiovascular;  Laterality: N/A;  . CARDIAC CATHETERIZATION N/A 06/17/2016   Procedure: Coronary Stent Intervention;  Surgeon: Runell Gess, MD;  Location: [facility] INVASIVE CV LAB;  Service: Cardiovascular;  Laterality: N/A;  . cardiac stents    . CERVICAL SPINE SURGERY       Family History  Problem Relation Age of Onset  . CAD Paternal Grandfather   . Breast cancer Mother   . Parkinson's disease Father   . Alzheimer's disease Father   . Hypertension Father   . Diabetes Father      Social History   Tobacco Use  . Smoking status: Current Every Day Smoker    Packs/day: 1.00    Types: Cigarettes  . Smokeless tobacco: Never Used  Vaping Use  . Vaping Use: Never used  Substance Use Topics  . Alcohol use: Yes    Comment: weekends  . Drug use: Yes    Frequency: 1.0 times per week    Types: Marijuana    Comment: twice monthly    No Known Allergies  Current Outpatient Medications  Medication Sig Dispense Refill  . amiodarone (PACERONE) 200 MG tablet Take by mouth.    Marland Kitchen atorvastatin (LIPITOR) 80 MG tablet Take 1 tablet (80 mg total)  by mouth daily.    . nitroGLYCERIN (NITROSTAT) 0.4 MG SL tablet Place 1 tablet (0.4 mg total) under the tongue every 5 (five) minutes as needed. 25 tablet 3  . ondansetron (ZOFRAN-ODT) 4 MG disintegrating tablet Take 4 mg by mouth every 8 (eight) hours as needed for nausea.    . VENTOLIN HFA 108 (90 Base) MCG/ACT inhaler Inhale 2 puffs into the lungs every 6 (six) hours as needed for wheezing or shortness of breath.    Marland Kitchen apixaban (ELIQUIS) 5 MG TABS tablet Take 1 tablet (5 mg total) by mouth 2 (two) times daily. 60 tablet 0  . carvedilol (COREG) 25 MG tablet Take 1 tablet (25 mg total) by mouth 2 (two) times daily with a meal. 60 tablet 0  . colchicine 0.6 MG tablet Take 1 tablet (0.6 mg total) by mouth daily. 30 tablet 0  . furosemide (LASIX) 40 MG tablet  Take 1 tablet (40 mg total) by mouth 2 (two) times daily. 60 tablet 0  . lisinopril (ZESTRIL) 20 MG tablet Take 1 tablet (20 mg total) by mouth daily. 30 tablet 0  . potassium chloride (KLOR-CON) 10 MEQ tablet Take 1 tablet (10 mEq total) by mouth daily. 30 tablet 0   No current facility-administered medications for this visit.      REVIEW OF SYSTEMS (Negative unless checked)  Constitutional: [] Weight loss  [] Fever  [] Chills Cardiac: [] Chest pain   [] Chest pressure   [] Palpitations   [] Shortness of breath when laying flat   [] Shortness of breath at rest   [] Shortness of breath with exertion. Vascular:  [x] Pain in legs with walking   [] Pain in legs at rest   [] Pain in legs when laying flat   [x] Claudication   [x] Pain in feet when walking  [x] Pain in feet at rest  [] Pain in feet when laying flat   [] History of DVT   [] Phlebitis   [x] Swelling in legs   [] Varicose veins   [] Non-healing ulcers Pulmonary:   [] Uses home oxygen   [] Productive cough   [] Hemoptysis   [] Wheeze  [] COPD   [] Asthma Neurologic:  [] Dizziness  [] Blackouts   [] Seizures   [] History of stroke   [] History of TIA  [] Aphasia   [] Temporary blindness   [] Dysphagia   [] Weakness or numbness in arms   [] Weakness or numbness in legs Musculoskeletal:  [x] Arthritis   [] Joint swelling   [x] Joint pain   [] Low back pain Hematologic:  [] Easy bruising  [] Easy bleeding   [] Hypercoagulable state   [] Anemic  [] Hepatitis Gastrointestinal:  [] Blood in stool   [] Vomiting blood  [] Gastroesophageal reflux/heartburn   [] Abdominal pain Genitourinary:  [] Chronic kidney disease   [] Difficult urination  [] Frequent urination  [] Burning with urination   [] Hematuria Skin:  [] Rashes   [] Ulcers   [] Wounds Psychological:  [] History of anxiety   []  History of major depression.    Physical Exam BP 115/79   Pulse 92   Ht 5\' 7"  (1.702 m)   Wt 182 lb (82.6 kg)   BMI 28.51 kg/m  Gen:  WD/WN, NAD Head: [place]/AT, No temporalis wasting.  Ear/Nose/Throat: Hearing  grossly intact, nares w/o erythema or drainage, oropharynx w/o Erythema/Exudate Eyes: Conjunctiva clear, sclera non-icteric  Neck: trachea midline.  No JVD.  Pulmonary:  Good air movement, respirations not labored, no use of accessory muscles  Cardiac: irregular Vascular:  Vessel Right Left  Radial Palpable Palpable  DP 2+ 1+  PT 1+ NP   Gastrointestinal:. No masses, surgical incisions, or scars. Musculoskeletal: M/S 5/5 throughout.  Extremities without ischemic changes.  No deformity or atrophy. 1+ BLE edema. Neurologic: Sensation grossly intact in extremities.  Symmetrical.  Speech is fluent. Motor exam as listed above. Psychiatric: Judgment intact, Mood & affect appropriate for pt's clinical situation. Dermatologic: No rashes or ulcers noted.  No cellulitis or open wounds.    Radiology No results found.  Labs No results found for this or any previous visit (from the past 2160 hour(s)).  Assessment/Plan:  Atherosclerosis of native arteries of extremity with rest pain (HCC) Noninvasive studies today show falsely elevated ABIs likely due to medial calcification.  Although the ABIs are in the normal range at 1.2 on the right and 1.0 on the left, the waveforms on the right are good with a digital pressure of 83 but the waveforms on the left are quite dampened monophasic with a digital pressure of only 49 consistent with significant ischemia of the left lower extremity.  Recommend:  The patient has evidence of severe atherosclerotic changes of both lower extremities with rest pain that is associated with preulcerative changes and impending tissue loss of the foot.  This represents a limb threatening ischemia and places the patient at the risk for limb loss.  Patient should undergo angiography of the lower extremities with the hope for intervention for limb salvage.  The risks and benefits as well as the alternative therapies was discussed in detail with  the patient.  All questions were answered.  Patient agrees to proceed with angiography.  The patient will follow up with me in the office after the procedure.       Acute on chronic systolic CHF (congestive heart failure) (HCC) His chronic congestive heart failure with an ejection fraction of 25% is certainly not helping his lower extremity perfusion.  Is also a major cause of his leg swelling.  Hyperlipidemia lipid control important in reducing the progression of atherosclerotic disease. Continue statin therapy   Bilateral leg edema Multifactorial but poor cardiac function as a primary cause.  Compression and elevation important to minimize the swelling.  He is on diuretics as well.      Festus Barren 06/19/2020, 10:07 AM   This note was created with Dragon medical transcription system.  Any errors from dictation are unintentional.

## 2020-06-19 NOTE — Assessment & Plan Note (Signed)
lipid control important in reducing the progression of atherosclerotic disease. Continue statin therapy  

## 2020-06-19 NOTE — Assessment & Plan Note (Signed)
Multifactorial but poor cardiac function as a primary cause.  Compression and elevation important to minimize the swelling.  He is on diuretics as well.

## 2020-06-19 NOTE — Progress Notes (Signed)
Patient ID: Cameron [name], male   DOB: [birth year], 59 y.o.   MRN: [id number]  Chief Complaint  Patient presents with  . PAD  . New Patient (Initial Visit)    HPI Cameron [name] is a 59 y.o. male.  I am asked to see the patient by Dr. [name] for evaluation of PAD.  For the past year or so, he has noticed increasing pain and difficulty with walking predominantly in the left leg.  This is different than his chronic swelling which has been on and off and managed primarily with Lasix.  He has significant cardiac history and follows with cardiology.  He is also had several episodes of gout but this pain is different.  He is now having pain that wakes him at night.  He says he can only sleep about 90 to 120 minutes before having to get up and dangle his left foot.  The pain has crescendoed with walking and he can now only walk about 50 feet before having pain.  The right leg has some mild pain with activity, but the left leg remains the predominantly affected limb.  No open wounds or ulceration.  No fevers or chills.  There is no clear inciting event or causative factor that started the symptoms.   Noninvasive studies today show falsely elevated ABIs likely due to medial calcification.  Although the ABIs are in the normal range at 1.2 on the right and 1.0 on the left, the waveforms on the right are good with a digital pressure of 83 but the waveforms on the left are quite dampened monophasic with a digital pressure of only 49 consistent with significant ischemia of the left lower extremity.   Past Medical History:  Diagnosis Date  . CHF (congestive heart failure) (HCC)   . Coronary artery disease    a. 12/17 PCI with DES to pLAD with resdiual disease (RX therapy)  . Hyperlipidemia   . Hypertension   . STEMI (ST elevation myocardial infarction) (HCC) 2017    Past Surgical History:  Procedure Laterality Date  . CARDIAC CATHETERIZATION N/A 06/17/2016   Procedure: Left Heart Cath and  Coronary Angiography;  Surgeon: Runell Gess, MD;  Location: [facility] INVASIVE CV LAB;  Service: Cardiovascular;  Laterality: N/A;  . CARDIAC CATHETERIZATION N/A 06/17/2016   Procedure: Coronary Stent Intervention;  Surgeon: Runell Gess, MD;  Location: [facility] INVASIVE CV LAB;  Service: Cardiovascular;  Laterality: N/A;  . cardiac stents    . CERVICAL SPINE SURGERY       Family History  Problem Relation Age of Onset  . CAD Paternal Grandfather   . Breast cancer Mother   . Parkinson's disease Father   . Alzheimer's disease Father   . Hypertension Father   . Diabetes Father      Social History   Tobacco Use  . Smoking status: Current Every Day Smoker    Packs/day: 1.00    Types: Cigarettes  . Smokeless tobacco: Never Used  Vaping Use  . Vaping Use: Never used  Substance Use Topics  . Alcohol use: Yes    Comment: weekends  . Drug use: Yes    Frequency: 1.0 times per week    Types: Marijuana    Comment: twice monthly    No Known Allergies  Current Outpatient Medications  Medication Sig Dispense Refill  . amiodarone (PACERONE) 200 MG tablet Take by mouth.    Marland Kitchen atorvastatin (LIPITOR) 80 MG tablet Take 1 tablet (80 mg total)  by mouth daily.    . nitroGLYCERIN (NITROSTAT) 0.4 MG SL tablet Place 1 tablet (0.4 mg total) under the tongue every 5 (five) minutes as needed. 25 tablet 3  . ondansetron (ZOFRAN-ODT) 4 MG disintegrating tablet Take 4 mg by mouth every 8 (eight) hours as needed for nausea.    . VENTOLIN HFA 108 (90 Base) MCG/ACT inhaler Inhale 2 puffs into the lungs every 6 (six) hours as needed for wheezing or shortness of breath.    Marland Kitchen apixaban (ELIQUIS) 5 MG TABS tablet Take 1 tablet (5 mg total) by mouth 2 (two) times daily. 60 tablet 0  . carvedilol (COREG) 25 MG tablet Take 1 tablet (25 mg total) by mouth 2 (two) times daily with a meal. 60 tablet 0  . colchicine 0.6 MG tablet Take 1 tablet (0.6 mg total) by mouth daily. 30 tablet 0  . furosemide (LASIX) 40 MG tablet  Take 1 tablet (40 mg total) by mouth 2 (two) times daily. 60 tablet 0  . lisinopril (ZESTRIL) 20 MG tablet Take 1 tablet (20 mg total) by mouth daily. 30 tablet 0  . potassium chloride (KLOR-CON) 10 MEQ tablet Take 1 tablet (10 mEq total) by mouth daily. 30 tablet 0   No current facility-administered medications for this visit.      REVIEW OF SYSTEMS (Negative unless checked)  Constitutional: [] Weight loss  [] Fever  [] Chills Cardiac: [] Chest pain   [] Chest pressure   [] Palpitations   [] Shortness of breath when laying flat   [] Shortness of breath at rest   [] Shortness of breath with exertion. Vascular:  [x] Pain in legs with walking   [] Pain in legs at rest   [] Pain in legs when laying flat   [x] Claudication   [x] Pain in feet when walking  [x] Pain in feet at rest  [] Pain in feet when laying flat   [] History of DVT   [] Phlebitis   [x] Swelling in legs   [] Varicose veins   [] Non-healing ulcers Pulmonary:   [] Uses home oxygen   [] Productive cough   [] Hemoptysis   [] Wheeze  [] COPD   [] Asthma Neurologic:  [] Dizziness  [] Blackouts   [] Seizures   [] History of stroke   [] History of TIA  [] Aphasia   [] Temporary blindness   [] Dysphagia   [] Weakness or numbness in arms   [] Weakness or numbness in legs Musculoskeletal:  [x] Arthritis   [] Joint swelling   [x] Joint pain   [] Low back pain Hematologic:  [] Easy bruising  [] Easy bleeding   [] Hypercoagulable state   [] Anemic  [] Hepatitis Gastrointestinal:  [] Blood in stool   [] Vomiting blood  [] Gastroesophageal reflux/heartburn   [] Abdominal pain Genitourinary:  [] Chronic kidney disease   [] Difficult urination  [] Frequent urination  [] Burning with urination   [] Hematuria Skin:  [] Rashes   [] Ulcers   [] Wounds Psychological:  [] History of anxiety   []  History of major depression.    Physical Exam BP 115/79   Pulse 92   Ht 5\' 7"  (1.702 m)   Wt 182 lb (82.6 kg)   BMI 28.51 kg/m  Gen:  WD/WN, NAD Head: [place]/AT, No temporalis wasting.  Ear/Nose/Throat: Hearing  grossly intact, nares w/o erythema or drainage, oropharynx w/o Erythema/Exudate Eyes: Conjunctiva clear, sclera non-icteric  Neck: trachea midline.  No JVD.  Pulmonary:  Good air movement, respirations not labored, no use of accessory muscles  Cardiac: irregular Vascular:  Vessel Right Left  Radial Palpable Palpable  DP 2+ 1+  PT 1+ NP   Gastrointestinal:. No masses, surgical incisions, or scars. Musculoskeletal: M/S 5/5 throughout.  Extremities without ischemic changes.  No deformity or atrophy. 1+ BLE edema. Neurologic: Sensation grossly intact in extremities.  Symmetrical.  Speech is fluent. Motor exam as listed above. Psychiatric: Judgment intact, Mood & affect appropriate for pt's clinical situation. Dermatologic: No rashes or ulcers noted.  No cellulitis or open wounds.    Radiology No results found.  Labs No results found for this or any previous visit (from the past 2160 hour(s)).  Assessment/Plan:  Atherosclerosis of native arteries of extremity with rest pain (HCC) Noninvasive studies today show falsely elevated ABIs likely due to medial calcification.  Although the ABIs are in the normal range at 1.2 on the right and 1.0 on the left, the waveforms on the right are good with a digital pressure of 83 but the waveforms on the left are quite dampened monophasic with a digital pressure of only 49 consistent with significant ischemia of the left lower extremity.  Recommend:  The patient has evidence of severe atherosclerotic changes of both lower extremities with rest pain that is associated with preulcerative changes and impending tissue loss of the foot.  This represents a limb threatening ischemia and places the patient at the risk for limb loss.  Patient should undergo angiography of the lower extremities with the hope for intervention for limb salvage.  The risks and benefits as well as the alternative therapies was discussed in detail with  the patient.  All questions were answered.  Patient agrees to proceed with angiography.  The patient will follow up with me in the office after the procedure.       Acute on chronic systolic CHF (congestive heart failure) (HCC) His chronic congestive heart failure with an ejection fraction of 25% is certainly not helping his lower extremity perfusion.  Is also a major cause of his leg swelling.  Hyperlipidemia lipid control important in reducing the progression of atherosclerotic disease. Continue statin therapy   Bilateral leg edema Multifactorial but poor cardiac function as a primary cause.  Compression and elevation important to minimize the swelling.  He is on diuretics as well.      Festus Barren 06/19/2020, 10:07 AM   This note was created with Dragon medical transcription system.  Any errors from dictation are unintentional.

## 2020-06-19 NOTE — Assessment & Plan Note (Signed)
His chronic congestive heart failure with an ejection fraction of 25% is certainly not helping his lower extremity perfusion.  Is also a major cause of his leg swelling.

## 2020-06-21 ENCOUNTER — Other Ambulatory Visit: Payer: Self-pay

## 2020-06-21 ENCOUNTER — Other Ambulatory Visit
Admission: RE | Admit: 2020-06-21 | Discharge: 2020-06-21 | Disposition: A | Discharge status: Home / Self Care | Payer: Commercial Managed Care - PPO | Source: Ambulatory Visit | Attending: Vascular Surgery | Admitting: Vascular Surgery

## 2020-06-21 DIAGNOSIS — Z01812 Encounter for preprocedural laboratory examination: Secondary | ICD-10-CM | POA: Insufficient documentation

## 2020-06-21 DIAGNOSIS — Z20822 Contact with and (suspected) exposure to covid-19: Secondary | ICD-10-CM | POA: Diagnosis not present

## 2020-06-21 LAB — SARS CORONAVIRUS 2 (TAT 6-24 HRS): SARS Coronavirus 2: NEGATIVE

## 2020-06-22 ENCOUNTER — Other Ambulatory Visit (INDEPENDENT_AMBULATORY_CARE_PROVIDER_SITE_OTHER): Payer: Self-pay | Admitting: Vascular Surgery

## 2020-06-23 ENCOUNTER — Inpatient Hospital Stay
Admission: EM | Admit: 2020-06-23 | Discharge: 2020-06-29 | DRG: 252 | Disposition: A | Discharge status: Home Health | Payer: Commercial Managed Care - PPO | Attending: Internal Medicine | Admitting: Internal Medicine

## 2020-06-23 ENCOUNTER — Other Ambulatory Visit: Payer: Self-pay

## 2020-06-23 ENCOUNTER — Encounter: Payer: Self-pay | Admitting: Emergency Medicine

## 2020-06-23 DIAGNOSIS — F17210 Nicotine dependence, cigarettes, uncomplicated: Secondary | ICD-10-CM | POA: Diagnosis present

## 2020-06-23 DIAGNOSIS — I5023 Acute on chronic systolic (congestive) heart failure: Secondary | ICD-10-CM | POA: Diagnosis present

## 2020-06-23 DIAGNOSIS — D509 Iron deficiency anemia, unspecified: Secondary | ICD-10-CM | POA: Diagnosis present

## 2020-06-23 DIAGNOSIS — Z20822 Contact with and (suspected) exposure to covid-19: Secondary | ICD-10-CM | POA: Diagnosis present

## 2020-06-23 DIAGNOSIS — E785 Hyperlipidemia, unspecified: Secondary | ICD-10-CM | POA: Diagnosis present

## 2020-06-23 DIAGNOSIS — M109 Gout, unspecified: Secondary | ICD-10-CM | POA: Diagnosis present

## 2020-06-23 DIAGNOSIS — E538 Deficiency of other specified B group vitamins: Secondary | ICD-10-CM | POA: Diagnosis present

## 2020-06-23 DIAGNOSIS — J9621 Acute and chronic respiratory failure with hypoxia: Secondary | ICD-10-CM

## 2020-06-23 DIAGNOSIS — E876 Hypokalemia: Secondary | ICD-10-CM | POA: Diagnosis not present

## 2020-06-23 DIAGNOSIS — N179 Acute kidney failure, unspecified: Secondary | ICD-10-CM | POA: Diagnosis present

## 2020-06-23 DIAGNOSIS — Z23 Encounter for immunization: Secondary | ICD-10-CM

## 2020-06-23 DIAGNOSIS — I70222 Atherosclerosis of native arteries of extremities with rest pain, left leg: Secondary | ICD-10-CM | POA: Diagnosis not present

## 2020-06-23 DIAGNOSIS — D539 Nutritional anemia, unspecified: Secondary | ICD-10-CM | POA: Diagnosis present

## 2020-06-23 DIAGNOSIS — Z955 Presence of coronary angioplasty implant and graft: Secondary | ICD-10-CM

## 2020-06-23 DIAGNOSIS — I2510 Atherosclerotic heart disease of native coronary artery without angina pectoris: Secondary | ICD-10-CM | POA: Diagnosis present

## 2020-06-23 DIAGNOSIS — I739 Peripheral vascular disease, unspecified: Secondary | ICD-10-CM

## 2020-06-23 DIAGNOSIS — I959 Hypotension, unspecified: Secondary | ICD-10-CM

## 2020-06-23 DIAGNOSIS — R0602 Shortness of breath: Secondary | ICD-10-CM

## 2020-06-23 DIAGNOSIS — Z79899 Other long term (current) drug therapy: Secondary | ICD-10-CM

## 2020-06-23 DIAGNOSIS — N1831 Chronic kidney disease, stage 3a: Secondary | ICD-10-CM | POA: Diagnosis present

## 2020-06-23 DIAGNOSIS — I252 Old myocardial infarction: Secondary | ICD-10-CM

## 2020-06-23 DIAGNOSIS — I70229 Atherosclerosis of native arteries of extremities with rest pain, unspecified extremity: Secondary | ICD-10-CM

## 2020-06-23 DIAGNOSIS — I10 Essential (primary) hypertension: Secondary | ICD-10-CM | POA: Diagnosis present

## 2020-06-23 DIAGNOSIS — J9601 Acute respiratory failure with hypoxia: Secondary | ICD-10-CM

## 2020-06-23 DIAGNOSIS — N182 Chronic kidney disease, stage 2 (mild): Secondary | ICD-10-CM

## 2020-06-23 DIAGNOSIS — M79662 Pain in left lower leg: Secondary | ICD-10-CM | POA: Diagnosis not present

## 2020-06-23 DIAGNOSIS — I130 Hypertensive heart and chronic kidney disease with heart failure and stage 1 through stage 4 chronic kidney disease, or unspecified chronic kidney disease: Secondary | ICD-10-CM | POA: Diagnosis present

## 2020-06-23 DIAGNOSIS — Z7901 Long term (current) use of anticoagulants: Secondary | ICD-10-CM

## 2020-06-23 HISTORY — DX: Peripheral vascular disease, unspecified: I73.9

## 2020-06-23 LAB — CBC
HCT: 31.1 % — ABNORMAL LOW (ref 39.0–52.0)
Hemoglobin: 10.5 g/dL — ABNORMAL LOW (ref 13.0–17.0)
MCH: 40.9 pg — ABNORMAL HIGH (ref 26.0–34.0)
MCHC: 33.8 g/dL (ref 30.0–36.0)
MCV: 121 fL — ABNORMAL HIGH (ref 80.0–100.0)
Platelets: 208 10*3/uL (ref 150–400)
RBC: 2.57 MIL/uL — ABNORMAL LOW (ref 4.22–5.81)
RDW: 19.1 % — ABNORMAL HIGH (ref 11.5–15.5)
WBC: 11.7 10*3/uL — ABNORMAL HIGH (ref 4.0–10.5)
nRBC: 0 % (ref 0.0–0.2)

## 2020-06-23 LAB — BASIC METABOLIC PANEL
Anion gap: 10 (ref 5–15)
BUN: 23 mg/dL — ABNORMAL HIGH (ref 6–20)
CO2: 33 mmol/L — ABNORMAL HIGH (ref 22–32)
Calcium: 8.3 mg/dL — ABNORMAL LOW (ref 8.9–10.3)
Chloride: 96 mmol/L — ABNORMAL LOW (ref 98–111)
Creatinine, Ser: 1.56 mg/dL — ABNORMAL HIGH (ref 0.61–1.24)
GFR, Estimated: 51 mL/min — ABNORMAL LOW (ref >60)
Glucose, Bld: 123 mg/dL — ABNORMAL HIGH (ref 70–99)
Potassium: 2.2 mmol/L — CL (ref 3.5–5.1)
Sodium: 139 mmol/L (ref 135–145)

## 2020-06-23 LAB — PHOSPHORUS: Phosphorus: 3.1 mg/dL (ref 2.5–4.6)

## 2020-06-23 LAB — RETICULOCYTES
Immature Retic Fract: 23.5 % — ABNORMAL HIGH (ref 2.3–15.9)
RBC.: 2.57 MIL/uL — ABNORMAL LOW (ref 4.22–5.81)
Retic Count, Absolute: 106.9 10*3/uL (ref 19.0–186.0)
Retic Ct Pct: 4.2 % — ABNORMAL HIGH (ref 0.4–3.1)

## 2020-06-23 LAB — IRON AND TIBC
Iron: 44 ug/dL — ABNORMAL LOW (ref 45–182)
Saturation Ratios: 17 % — ABNORMAL LOW (ref 17.9–39.5)
TIBC: 263 ug/dL (ref 250–450)
UIBC: 219 ug/dL

## 2020-06-23 LAB — FERRITIN: Ferritin: 249 ng/mL (ref 24–336)

## 2020-06-23 LAB — MAGNESIUM: Magnesium: 1.5 mg/dL — ABNORMAL LOW (ref 1.7–2.4)

## 2020-06-23 LAB — FOLATE: Folate: 4.7 ng/mL — ABNORMAL LOW (ref >5.9)

## 2020-06-23 LAB — POTASSIUM: Potassium: 2.4 mmol/L — CL (ref 3.5–5.1)

## 2020-06-23 MED ORDER — FERROUS SULFATE 325 (65 FE) MG PO TABS
325.0000 mg | ORAL_TABLET | Freq: Two times a day (BID) | ORAL | Status: DC
  Administered 2020-06-24 – 2020-06-29 (×10): 325 mg via ORAL
  Filled 2020-06-23 (×12): qty 1

## 2020-06-23 MED ORDER — ACETAMINOPHEN 325 MG PO TABS
650.0000 mg | ORAL_TABLET | Freq: Four times a day (QID) | ORAL | Status: DC | PRN
  Filled 2020-06-23: qty 2

## 2020-06-23 MED ORDER — ALBUTEROL SULFATE HFA 108 (90 BASE) MCG/ACT IN AERS
2.0000 | INHALATION_SPRAY | Freq: Four times a day (QID) | RESPIRATORY_TRACT | Status: DC | PRN
  Administered 2020-06-25: 13:00:00 2 via RESPIRATORY_TRACT
  Filled 2020-06-23: qty 6.7

## 2020-06-23 MED ORDER — NITROGLYCERIN 0.4 MG SL SUBL: 0.4000 mg | SUBLINGUAL_TABLET | SUBLINGUAL | Status: DC | PRN

## 2020-06-23 MED ORDER — ATORVASTATIN CALCIUM 20 MG PO TABS
80.0000 mg | ORAL_TABLET | Freq: Every day | ORAL | Status: DC
  Administered 2020-06-24 – 2020-06-29 (×6): 80 mg via ORAL
  Filled 2020-06-23 (×6): qty 4

## 2020-06-23 MED ORDER — POTASSIUM CHLORIDE IN NACL 40-0.9 MEQ/L-% IV SOLN: INTRAVENOUS | Status: AC

## 2020-06-23 MED ORDER — FOLIC ACID 1 MG PO TABS
1.0000 mg | ORAL_TABLET | Freq: Every day | ORAL | Status: DC
  Administered 2020-06-24 – 2020-06-29 (×6): 1 mg via ORAL
  Filled 2020-06-23 (×7): qty 1

## 2020-06-23 MED ORDER — TRAMADOL HCL 50 MG PO TABS
50.0000 mg | ORAL_TABLET | Freq: Once | ORAL | Status: AC
Start: 2020-06-23 — End: 2020-06-23
  Administered 2020-06-23: 20:00:00 50 mg via ORAL
  Filled 2020-06-23: qty 1

## 2020-06-23 MED ORDER — CARVEDILOL 25 MG PO TABS
25.0000 mg | ORAL_TABLET | Freq: Two times a day (BID) | ORAL | Status: DC
  Administered 2020-06-24 (×2): 25 mg via ORAL
  Filled 2020-06-23 (×2): qty 1

## 2020-06-23 MED ORDER — ACETAMINOPHEN 650 MG RE SUPP: 650.0000 mg | Freq: Four times a day (QID) | RECTAL | Status: DC | PRN

## 2020-06-23 MED ORDER — APIXABAN 5 MG PO TABS
5.0000 mg | ORAL_TABLET | Freq: Two times a day (BID) | ORAL | Status: DC
  Administered 2020-06-24 – 2020-06-29 (×11): 5 mg via ORAL
  Filled 2020-06-23 (×12): qty 1

## 2020-06-23 MED ORDER — ONDANSETRON HCL 4 MG/2ML IJ SOLN
4.0000 mg | Freq: Once | INTRAMUSCULAR | Status: AC
  Administered 2020-06-23: 22:00:00 4 mg via INTRAVENOUS
  Filled 2020-06-23: qty 2

## 2020-06-23 MED ORDER — MORPHINE SULFATE (PF) 2 MG/ML IV SOLN
2.0000 mg | INTRAVENOUS | Status: DC | PRN
  Administered 2020-06-23 – 2020-06-27 (×12): 2 mg via INTRAVENOUS
  Filled 2020-06-23 (×7): qty 1

## 2020-06-23 MED ORDER — COLCHICINE 0.6 MG PO TABS
0.6000 mg | ORAL_TABLET | Freq: Every day | ORAL | Status: DC
  Administered 2020-06-25 – 2020-06-29 (×5): 0.6 mg via ORAL
  Filled 2020-06-23 (×8): qty 1

## 2020-06-23 MED ORDER — POTASSIUM CHLORIDE IN NACL 40-0.9 MEQ/L-% IV SOLN
INTRAVENOUS | Status: DC
  Filled 2020-06-23: qty 1000

## 2020-06-23 MED ORDER — MAGNESIUM SULFATE 2 GM/50ML IV SOLN
2.0000 g | Freq: Once | INTRAVENOUS | Status: AC
  Administered 2020-06-23: 22:00:00 2 g via INTRAVENOUS
  Filled 2020-06-23: qty 50

## 2020-06-23 MED ORDER — POTASSIUM CHLORIDE 10 MEQ/100ML IV SOLN
10.0000 meq | Freq: Once | INTRAVENOUS | Status: AC
  Administered 2020-06-23: 22:00:00 10 meq via INTRAVENOUS
  Filled 2020-06-23: qty 100

## 2020-06-23 MED ORDER — VITAMIN B-12 1000 MCG PO TABS
1000.0000 ug | ORAL_TABLET | Freq: Every day | ORAL | Status: DC
  Administered 2020-06-24 – 2020-06-29 (×6): 1000 ug via ORAL
  Filled 2020-06-23 (×6): qty 1

## 2020-06-23 MED ORDER — POTASSIUM CHLORIDE CRYS ER 20 MEQ PO TBCR
30.0000 meq | EXTENDED_RELEASE_TABLET | Freq: Once | ORAL | Status: AC
  Administered 2020-06-23: 20:00:00 30 meq via ORAL
  Filled 2020-06-23: qty 2

## 2020-06-23 MED ORDER — PROCHLORPERAZINE EDISYLATE 10 MG/2ML IJ SOLN
5.0000 mg | INTRAMUSCULAR | Status: DC | PRN
  Filled 2020-06-23: qty 1

## 2020-06-23 MED ORDER — MORPHINE SULFATE (PF) 2 MG/ML IV SOLN
2.0000 mg | INTRAVENOUS | Status: DC | PRN
  Filled 2020-06-23 (×5): qty 1

## 2020-06-23 NOTE — H&P (Signed)
History and Physical    Cameron [name] VZD:[id number] DOB: December 16, 1960 DOA: 06/23/2020  PCP: Patient, No Pcp Per  Patient coming from: Home.  I have personally briefly reviewed patient's old medical records in [facility]  Chief Complaint: LLE pain.  HPI: Cameron [name] is a 59 y.o. male with medical history significant of chronic systolic heart failure, coronary artery disease, history of STEMI, peripheral arterial disease, hyperlipidemia, hypertension who is coming to the emergency department due to exacerbated chronic burning and pain on his left lower extremity.  The patient states he has had this pain for months, but was significantly worsened today.  He was found to have hypokalemia and hypomagnesemia today in the emergency department.  He has been using furosemide since August, but stated that he has not used the supplemental potassium after the first month supply.  He does not take magnesium supplementation.  He recently saw vascular surgery Festus Barren, MD) and he is scheduled for angiography of his lower extremities on Monday.  The patient states that he lives alone and is getting increasingly more difficult to perform simple tasks at home.  He uses a walker to ambulate.  He denies fever, headache, chills, chest pain, palpitations, diaphoresis, PND, orthopnea.  No abdominal pain, diarrhea, constipation, melena or hematochezia.  No dysuria, frequency or materia.  Denies polyuria, polydipsia, polyphagia or blurred vision.  ED Course: Initial vital signs were temperature 98.5 F, pulse 81, respiration 18, blood pressure 125/83 mmHg O2 sat 98% on room air.  The patient received potassium supplementation, Zofran 4 mg IVP, tramadol 50 mg p.o. and 2 mg of morphine IVP.  Labwork: CBC showed a white count of 11.5, hemoglobin 10.5 g/dL with an MCV of 295.1 fL and platelets were 208.  About 4 months ago his hemoglobin was 14.6 g/dL and MCV 884.1 fL.  Sodium 139, potassium 2.2, chloride 96  and CO2 33 mmol/L.  Glucose 123, BUN 23 and creatinine 1.56 mg/dL.  4 months ago creatinine was 1.14 mg/dL.  Magnesium is 1.5 and phosphorus 3.1 mg/dL.  Review of Systems: As per HPI otherwise all other systems reviewed and are negative.  Past Medical History:  Diagnosis Date  . CHF (congestive heart failure) (HCC)   . Coronary artery disease    a. 12/17 PCI with DES to pLAD with resdiual disease (RX therapy)  . Hyperlipidemia   . Hypertension   . PAD (peripheral artery disease) (HCC)   . STEMI (ST elevation myocardial infarction) (HCC) 2017    Past Surgical History:  Procedure Laterality Date  . CARDIAC CATHETERIZATION N/A 06/17/2016   Procedure: Left Heart Cath and Coronary Angiography;  Surgeon: Runell Gess, MD;  Location: [facility] INV[facility] CV LAB;  Service: Cardiovascular;  Laterality: N/A;  . CARDIAC CATHETERIZATION N/A 06/17/2016   Procedure: Coronary Stent Intervention;  Surgeon: Runell Gess, MD;  Location: [facility] INVASIVE CV LAB;  Service: Cardiovascular;  Laterality: N/A;  . cardiac stents    . CERVICAL SPINE SURGERY     Social History  reports that he has been smoking cigarettes. He has been smoking about 1.00 pack per day. He has never used smokeless tobacco. He reports current alcohol use. He reports current drug use. Frequency: 1.00 time per week. Drug: Marijuana.  No Known Allergies  Family History  Problem Relation Age of Onset  . CAD Paternal Grandfather   . Breast cancer Mother   . Parkinson's disease Father   . Alzheimer's disease Father   . Hypertension Father   .  Diabetes Father    Prior to Admission medications   Medication Sig Start Date End Date Taking? Authorizing Provider  amiodarone (PACERONE) 200 MG tablet Take by mouth. 03/02/20 03/02/21  [provider]  apixaban (ELIQUIS) 5 MG TABS tablet Take 1 tablet (5 mg total) by mouth 2 (two) times daily. 02/21/20 03/22/20  Charise Killian, MD  atorvastatin (LIPITOR) 80 MG tablet Take 1 tablet (80  mg total) by mouth daily. 02/21/20   Charise Killian, MD  carvedilol (COREG) 25 MG tablet Take 1 tablet (25 mg total) by mouth 2 (two) times daily with a meal. 02/21/20 03/22/20  Charise Killian, MD  colchicine 0.6 MG tablet Take 1 tablet (0.6 mg total) by mouth daily. 02/22/20 03/23/20  Charise Killian, MD  furosemide (LASIX) 40 MG tablet Take 1 tablet (40 mg total) by mouth 2 (two) times daily. 02/21/20 03/22/20  Charise Killian, MD  lisinopril (ZESTRIL) 20 MG tablet Take 1 tablet (20 mg total) by mouth daily. 02/21/20 03/22/20  Charise Killian, MD  nitroGLYCERIN (NITROSTAT) 0.4 MG SL tablet Place 1 tablet (0.4 mg total) under the tongue every 5 (five) minutes as needed. 06/20/16   Arty Baumgartner, NP  ondansetron (ZOFRAN-ODT) 4 MG disintegrating tablet Take 4 mg by mouth every 8 (eight) hours as needed for nausea. 02/08/20   [provider]  potassium chloride (KLOR-CON) 10 MEQ tablet Take 1 tablet (10 mEq total) by mouth daily. 02/21/20 03/22/20  Charise Killian, MD  VENTOLIN HFA 108 [phone] Base) MCG/ACT inhaler Inhale 2 puffs into the lungs every 6 (six) hours as needed for wheezing or shortness of breath. 09/27/19   [provider]  isosorbide mononitrate (IMDUR) 60 MG 24 hr tablet Take 1 tablet (60 mg total) by mouth daily. 02/04/18 05/30/19  [name], MD   Physical Exam: Vitals:   06/23/20 1650 06/23/20 1651 06/23/20 2003  BP: 125/83  120/86  Pulse: 81  82  Resp: 18  14  Temp: 98.5 F (36.9 C)    TempSrc: Oral    SpO2: 98%  100%  Weight:  82.6 kg   Height:  5\' 7"  (1.702 m)    Constitutional: Looks chronically ill, but in NAD. Eyes: PERRL, lids and conjunctivae normal ENMT: Mucous membranes are mildly dry. Posterior pharynx clear of any exudate or lesions. Neck: normal, supple, no masses, no thyromegaly Respiratory: clear to auscultation bilaterally, no wheezing, no crackles. Normal respiratory effort. No accessory muscle use.  Cardiovascular:  Irregularly irregular in the 80s, no murmurs / rubs / gallops. No extremity edema.  No palpable pedal pulses.  LLE feels cooler than RLE.Marland Kitchen No carotid bruits.  Abdomen: Obese, nondistended.  Bowel sounds positive.  Soft, no tenderness, no masses palpated. No hepatosplenomegaly. Musculoskeletal: no clubbing / cyanosis.  Good ROM, no contractures. Normal muscle tone.  Skin: no obvious rashes, lesions, ulcers on very limited dermatological examination. Neurologic: CN 2-12 grossly intact. Sensation intact, DTR normal. Strength 5/5 in all 4.  Psychiatric: Normal judgment and insight. Alert and oriented x 3. Normal mood.   Labs on Admission: I have personally reviewed following labs and imaging studies  CBC: Recent Labs  Lab 06/23/20 1652  WBC 11.7*  HGB 10.5*  HCT 31.1*  MCV 121.0*  PLT 208    Basic Metabolic Panel: Recent Labs  Lab 06/23/20 1652 06/23/20 1830  NA 139  --   K 2.2* 2.4*  CL 96*  --   CO2 33*  --  GLUCOSE 123*  --   BUN 23*  --   CREATININE 1.56*  --   CALCIUM 8.3*  --   MG  --  1.5*    GFR: Estimated Creatinine Clearance: 53.1 mL/min (A) (by C-G formula based on SCr of 1.56 mg/dL (H)).  Liver Function Tests: No results for input(s): AST, ALT, ALKPHOS, BILITOT, PROT, ALBUMIN in the last 168 hours.  Urine analysis:    Component Value Date/Time   COLORURINE RED (A) 03/09/2017 1132   APPEARANCEUR TURBID (A) 03/09/2017 1132   APPEARANCEUR Hazy 11/20/2011 1343   LABSPEC 1.027 03/09/2017 1132   LABSPEC 1.026 11/20/2011 1343   PHURINE  03/09/2017 1132    TEST NOT REPORTED DUE TO COLOR INTERFERENCE OF URINE PIGMENT   GLUCOSEU (A) 03/09/2017 1132    TEST NOT REPORTED DUE TO COLOR INTERFERENCE OF URINE PIGMENT   GLUCOSEU Negative 11/20/2011 1343   HGBUR (A) 03/09/2017 1132    TEST NOT REPORTED DUE TO COLOR INTERFERENCE OF URINE PIGMENT   BILIRUBINUR (A) 03/09/2017 1132    TEST NOT REPORTED DUE TO COLOR INTERFERENCE OF URINE PIGMENT   BILIRUBINUR Negative  11/20/2011 1343   KETONESUR (A) 03/09/2017 1132    TEST NOT REPORTED DUE TO COLOR INTERFERENCE OF URINE PIGMENT   PROTEINUR (A) 03/09/2017 1132    TEST NOT REPORTED DUE TO COLOR INTERFERENCE OF URINE PIGMENT   NITRITE (A) 03/09/2017 1132    TEST NOT REPORTED DUE TO COLOR INTERFERENCE OF URINE PIGMENT   LEUKOCYTESUR (A) 03/09/2017 1132    TEST NOT REPORTED DUE TO COLOR INTERFERENCE OF URINE PIGMENT   LEUKOCYTESUR Negative 11/20/2011 1343    Radiological Exams on Admission: No results found.  EKG: Independently reviewed.   Assessment/Plan Principal Problem:   Hypokalemia Observation/telemetry. IV hydration with potassium supplementation. Magnesium sulfate 2 g IVPB. Follow-up potassium level.  Active Problems:   AKI (acute kidney injury) (HCC) Hold furosemide. Hold ACE inhibitor. Continue IV fluids.  Monitor intake and output.    Hypomagnesemia Replacement ordered. Follow-up level as needed.    PAD (peripheral artery disease) (HCC) On apixaban and atorvastatin. Schedule for angiography on Monday. Will likely need to stay in the hospital.    Essential hypertension   Hyperlipidemia Continue atorvastatin 80 mg p.o. daily.    CAD (coronary artery disease), native coronary artery Continue apixaban, statin and beta-blocker.    Macrocytic anemia Check anemia panel. Micronutrient supplementation as needed. Monitor hematocrit and hemoglobin.   DVT prophylaxis: On apixaban. Code Status:   Full code. Family Communication: Disposition Plan:   Patient is from:  Home.  Anticipated DC to:  Home.  Anticipated DC date:  06/24/2020.  Anticipated DC barriers: Clinical status.  Consults called: Admission status:  Observation/telemetry.   Severity of Illness: High due to AKI with multiple electrolyte abnormalities causing lower extremity spasms.  He will require electrolyte and volume repletion as he is a scheduled to have a procedure on Monday by vascular surgery.  Bobette Mo MD [facility]  How to contact the [facility] Attending or Consulting provider 7A - 7P or covering provider during after hours 7P -7A, for this patient?   1. Check the care team in [facility] and look for a) attending/consulting [facility] provider listed and b) the [facility] team listed 2. Log into [email or web address] and use [facility]'s universal password to access. If you do not have the password, please contact the hospital operator. 3. Locate the [facility] provider you are looking for under [facility] and page to  a number that you can be directly reached. 4. If you still have difficulty reaching the provider, please page the [facility] (Director on Call) for the Hospitalists listed on amion for assistance.  06/23/2020, 9:08 PM   This document was prepared using Dragon voice recognition software and may contain some unintended transcription errors.

## 2020-06-23 NOTE — ED Provider Notes (Signed)
[facility] Emergency Department Provider Note  ____________________________________________   None    (approximate)  I have reviewed the triage vital signs and the nursing notes.   HISTORY  Chief Complaint PAD  HPI Cameron [name] is a 59 y.o. male presents himself to the ED for evaluation of acute on chronic left greater than right leg pain.  Patient with history of peripheral vascular disease, has been followed by Dr. Wyn Quaker, scheduled for angiography on Monday.  He presents at this time with complaints of left leg burning and discomfort which worsened last night.  He denies any chest pain, shortness of breath, syncope, or weakness.  He reports the pain in his leg is worse with standing and walking. He denies any dark stools or hemoptysis.      Past Medical History:  Diagnosis Date  . CHF (congestive heart failure) (HCC)   . Coronary artery disease    a. 12/17 PCI with DES to pLAD with resdiual disease (RX therapy)  . Hyperlipidemia   . Hypertension   . PAD (peripheral artery disease) (HCC)   . STEMI (ST elevation myocardial infarction) [facility]) 2017    Patient Active Problem List   Diagnosis Date Noted  . Atherosclerosis of native arteries of extremity with rest pain (HCC) 06/19/2020  . Acute on chronic systolic CHF (congestive heart failure) (HCC) 02/17/2020  . Hypokalemia 02/17/2020  . Hypomagnesemia 02/17/2020  . Elevated troponin 02/17/2020  . New onset atrial fibrillation (HCC) 02/17/2020  . Olecranon bursitis of left elbow 02/17/2020  . NSVT (nonsustained ventricular tachycardia) (HCC) 02/17/2020  . Tobacco abuse 02/17/2020  . Apical mural thrombus 02/17/2020  . Gout_left elbow 02/17/2020  . Bilateral leg edema 10/19/2018  . Weight gain 10/19/2018  . Chest pain 02/02/2018  . Acute pain of right knee 03/13/2017  . Essential hypertension 06/20/2016  . Hyperlipidemia 06/20/2016  . CAD (coronary artery disease), native coronary artery  06/20/2016  . STEMI involving oth coronary artery of anterior wall (HCC) 06/17/2016  . STEMI (ST elevation myocardial infarction) (HCC) 06/17/2016    Past Surgical History:  Procedure Laterality Date  . CARDIAC CATHETERIZATION N/A 06/17/2016   Procedure: Left Heart Cath and Coronary Angiography;  Surgeon: Runell Gess, MD;  Location: [facility] INVASIVE CV LAB;  Service: Cardiovascular;  Laterality: N/A;  . CARDIAC CATHETERIZATION N/A 06/17/2016   Procedure: Coronary Stent Intervention;  Surgeon: Runell Gess, MD;  Location: [facility] INVASIVE CV LAB;  Service: Cardiovascular;  Laterality: N/A;  . cardiac stents    . CERVICAL SPINE SURGERY      Prior to Admission medications   Medication Sig Start Date End Date Taking? Authorizing Provider  amiodarone (PACERONE) 200 MG tablet Take by mouth. 03/02/20 03/02/21  [provider]  apixaban (ELIQUIS) 5 MG TABS tablet Take 1 tablet (5 mg total) by mouth 2 (two) times daily. 02/21/20 03/22/20  Charise Killian, MD  atorvastatin (LIPITOR) 80 MG tablet Take 1 tablet (80 mg total) by mouth daily. 02/21/20   Charise Killian, MD  carvedilol (COREG) 25 MG tablet Take 1 tablet (25 mg total) by mouth 2 (two) times daily with a meal. 02/21/20 03/22/20  Charise Killian, MD  colchicine 0.6 MG tablet Take 1 tablet (0.6 mg total) by mouth daily. 02/22/20 03/23/20  Charise Killian, MD  furosemide (LASIX) 40 MG tablet Take 1 tablet (40 mg total) by mouth 2 (two) times daily. 02/21/20 03/22/20  Charise Killian, MD  lisinopril (ZESTRIL) 20 MG tablet  Take 1 tablet (20 mg total) by mouth daily. 02/21/20 03/22/20  Charise Killian, MD  nitroGLYCERIN (NITROSTAT) 0.4 MG SL tablet Place 1 tablet (0.4 mg total) under the tongue every 5 (five) minutes as needed. 06/20/16   Arty Baumgartner, NP  ondansetron (ZOFRAN-ODT) 4 MG disintegrating tablet Take 4 mg by mouth every 8 (eight) hours as needed for nausea. 02/08/20   [provider]  potassium chloride  (KLOR-CON) 10 MEQ tablet Take 1 tablet (10 mEq total) by mouth daily. 02/21/20 03/22/20  Charise Killian, MD  VENTOLIN HFA 108 [phone] Base) MCG/ACT inhaler Inhale 2 puffs into the lungs every 6 (six) hours as needed for wheezing or shortness of breath. 09/27/19   [provider]  isosorbide mononitrate (IMDUR) 60 MG 24 hr tablet Take 1 tablet (60 mg total) by mouth daily. 02/04/18 05/30/19  [name], MD    Allergies Patient has no known allergies.  Family History  Problem Relation Age of Onset  . CAD Paternal Grandfather   . Breast cancer Mother   . Parkinson's disease Father   . Alzheimer's disease Father   . Hypertension Father   . Diabetes Father     Social History Social History   Tobacco Use  . Smoking status: Current Every Day Smoker    Packs/day: 1.00    Types: Cigarettes  . Smokeless tobacco: Never Used  Vaping Use  . Vaping Use: Never used  Substance Use Topics  . Alcohol use: Yes    Comment: weekends  . Drug use: Yes    Frequency: 1.0 times per week    Types: Marijuana    Comment: twice monthly    Review of Systems Constitutional: No fever/chills Eyes: No visual changes. ENT: No sore throat. Cardiovascular: Denies chest pain. LLE swelling Respiratory: Denies shortness of breath. Gastrointestinal: No abdominal pain.  No nausea, no vomiting.  No diarrhea.  No constipation. Genitourinary: Negative for dysuria. Musculoskeletal: Negative for back pain. Skin: Negative for rash. Neurological: Negative for headaches, focal weakness or numbness. ____________________________________________   PHYSICAL EXAM:  VITAL SIGNS: ED Triage Vitals  Enc Vitals Group     BP 06/23/20 1650 125/83     Pulse Rate 06/23/20 1650 81     Resp 06/23/20 1650 18     Temp 06/23/20 1650 98.5 F (36.9 C)     Temp Source 06/23/20 1650 Oral     SpO2 06/23/20 1650 98 %     Weight 06/23/20 1651 182 lb (82.6 kg)     Height 06/23/20 1651 5\' 7"  (1.702 m)     Head Circumference  --      Peak Flow --      Pain Score 06/23/20 1651 8     Pain Loc --      Pain Edu? --      Excl. in GC? --    Constitutional: Alert and oriented. Well appearing and in no acute distress. Eyes: Conjunctivae are normal. PERRL. EOMI. Head: Atraumatic. Nose: No congestion/rhinnorhea. Mouth/Throat: Mucous membranes are moist.  Oropharynx non-erythematous. Neck: No stridor.   Cardiovascular: Normal rate, regular rhythm. Grossly normal heart sounds.  Cap refill less than 2 seconds. LLE DP/PT pulses confirmed with doppler.  Respiratory: Normal respiratory effort.  No retractions. Lungs CTAB. Gastrointestinal: Soft and nontender. No distention. No abdominal bruits. No CVA tenderness. Musculoskeletal: No lower extremity tenderness nor edema.  No joint effusions. Neurologic:  Normal speech and language. No gross focal neurologic deficits are appreciated. No gait instability. Skin:  Skin is warm, dry and intact. No rash noted. No skin breakdown. Psychiatric: Mood and affect are normal. Speech and behavior are normal.  ____________________________________________   LABS (all labs ordered are listed, but only abnormal results are displayed)  Labs Reviewed  CBC - Abnormal; Notable for the following components:      Result Value   WBC 11.7 (*)    RBC 2.57 (*)    Hemoglobin 10.5 (*)    HCT 31.1 (*)    MCV 121.0 (*)    MCH 40.9 (*)    RDW 19.1 (*)    All other components within normal limits  BASIC METABOLIC PANEL - Abnormal; Notable for the following components:   Potassium 2.2 (*)    Chloride 96 (*)    CO2 33 (*)    Glucose, Bld 123 (*)    BUN 23 (*)    Creatinine, Ser 1.56 (*)    Calcium 8.3 (*)    GFR, Estimated 51 (*)    All other components within normal limits  POTASSIUM - Abnormal; Notable for the following components:   Potassium 2.4 (*)    All other components within normal limits  MAGNESIUM   Heme Negative - Stool  guiaic ____________________________________________  EKG  See EKG report ____________________________________________  RADIOLOGY I, Lissa Hoard, personally viewed and evaluated these images (plain radiographs) as part of my medical decision making, as well as reviewing the written report by the radiologist.  ED MD interpretation:   Official radiology report(s): No results found.  ____________________________________________   PROCEDURES  Procedure(s) performed (including Critical Care):  Procedures  Ultram 50 mg PO Morphine 2 mg IVP x 2 PRN Zofran 4 mg IVP  ____________________________________________   INITIAL IMPRESSION / ASSESSMENT AND PLAN / ED COURSE  As part of my medical decision making, I reviewed the following data within the electronic MEDICAL RECORD NUMBER Notes from prior ED visits and [place] Controlled Substance Database  Patient with a history of peripheral vascular disease presents to the ED with increased pain to the left leg and difficulty ambulating secondary to pain.  Patient was evaluated for symptoms with labs which revealed a significant hypokalemia at 2.2.  He only corrected 2.4 the time of this disposition.  He is also found to have a moderate anemia with a RBC of 2.57 H&H of 10.5/31.1 mg/dl.  Patient was agreeable to admission for evaluation of his acute leg pain secondary to peripheral vascular disease as well as his profound hypokalemia. ____________________________________________   FINAL CLINICAL IMPRESSION(S) / ED DIAGNOSES  Final diagnoses:  PVD (peripheral vascular disease) (HCC)  Hypokalemia     ED Discharge Orders    None      *Please note:  Cameron [name] was evaluated in Emergency Department on 06/23/2020 for the symptoms described in the history of present illness. He was evaluated in the context of the global COVID-19 pandemic, which necessitated consideration that the patient might be at risk for infection with the  SARS-CoV-2 virus that causes COVID-19. Institutional protocols and algorithms that pertain to the evaluation of patients at risk for COVID-19 are in a state of rapid change based on information released by regulatory bodies including the CDC and federal and state organizations. These policies and algorithms were followed during the patient's care in the ED.  Some ED evaluations and interventions may be delayed as a result of limited staffing during and the pandemic.*   Note:  This document was prepared using Conservation officer, historic buildings and  may include unintentional dictation errors.    Lissa Hoard, PA-C 06/23/20 2051    Minna Antis, MD 06/23/20 2144

## 2020-06-23 NOTE — ED Notes (Signed)
Pt's sats dropping into the low 80s without recovery; pt found to be laying down but awake watching tv. Placed [place] with supplemental O2 @ 2L with improvement to sats in mid 90s.

## 2020-06-23 NOTE — ED Triage Notes (Signed)
Pt arrived via POV from home with reports of L leg burning and discomfort. Worse since last night.  Pt has L leg angio scheduled for Monday with Dr. Wyn Quaker, states pain worse than before.  L leg is cooler than R leg, but not cold, pt states this has been this way for the past week and Dr. Wyn Quaker is aware.

## 2020-06-23 NOTE — ED Provider Notes (Addendum)
-----------------------------------------   9:14 PM on 06/23/2020 -----------------------------------------  Patient seen in conjunction with physician assistant Lendell Caprice.  Patient describes worsening pain and weakness in his legs especially left lower extremity.  As a procedure scheduled with vascular this week.  Patient states he has been feeling very weak, lab work shows significant hypokalemia at 2.2.  Recheck shows 2.4.  We will begin IV and oral potassium repletion.  Magnesium level has resulted at 1.5.  Patient has somewhat anemic, rectal exam performed by Lendell Caprice is negative.  Patient is on Eliquis.  Patient also appears to have acute renal insufficiency.  We will admit to the hospital service for further work-up and treatment.  I discussed this with the patient who is agreeable to this plan of care.   Minna Antis, MD 06/23/20 2115  EKG viewed and interpreted by myself shows atrial fibrillation at 76 bpm the narrow QRS, normal axis, largely normal intervals with nonspecific ST changes.  I reviewed the patient's EKG 02/20/2020 which appears consistent with atrial fibrillation as well.   Minna Antis, MD 06/23/20 2141

## 2020-06-24 DIAGNOSIS — E876 Hypokalemia: Secondary | ICD-10-CM | POA: Diagnosis not present

## 2020-06-24 LAB — COMPREHENSIVE METABOLIC PANEL
ALT: 7 U/L (ref 0–44)
AST: 15 U/L (ref 15–41)
Albumin: 3.2 g/dL — ABNORMAL LOW (ref 3.5–5.0)
Alkaline Phosphatase: 74 U/L (ref 38–126)
Anion gap: 12 (ref 5–15)
BUN: 20 mg/dL (ref 6–20)
CO2: 32 mmol/L (ref 22–32)
Calcium: 8.5 mg/dL — ABNORMAL LOW (ref 8.9–10.3)
Chloride: 96 mmol/L — ABNORMAL LOW (ref 98–111)
Creatinine, Ser: 1.37 mg/dL — ABNORMAL HIGH (ref 0.61–1.24)
GFR, Estimated: 60 mL/min — ABNORMAL LOW (ref >60)
Glucose, Bld: 124 mg/dL — ABNORMAL HIGH (ref 70–99)
Potassium: 3.1 mmol/L — ABNORMAL LOW (ref 3.5–5.1)
Sodium: 140 mmol/L (ref 135–145)
Total Bilirubin: 0.8 mg/dL (ref 0.3–1.2)
Total Protein: 7.1 g/dL (ref 6.5–8.1)

## 2020-06-24 LAB — BASIC METABOLIC PANEL
Anion gap: 11 (ref 5–15)
BUN: 20 mg/dL (ref 6–20)
CO2: 31 mmol/L (ref 22–32)
Calcium: 8.4 mg/dL — ABNORMAL LOW (ref 8.9–10.3)
Chloride: 96 mmol/L — ABNORMAL LOW (ref 98–111)
Creatinine, Ser: 1.17 mg/dL (ref 0.61–1.24)
GFR, Estimated: >60 mL/min (ref >60)
Glucose, Bld: 122 mg/dL — ABNORMAL HIGH (ref 70–99)
Potassium: 3.4 mmol/L — ABNORMAL LOW (ref 3.5–5.1)
Sodium: 138 mmol/L (ref 135–145)

## 2020-06-24 LAB — CBC
HCT: 33 % — ABNORMAL LOW (ref 39.0–52.0)
Hemoglobin: 10.7 g/dL — ABNORMAL LOW (ref 13.0–17.0)
MCH: 40.2 pg — ABNORMAL HIGH (ref 26.0–34.0)
MCHC: 32.4 g/dL (ref 30.0–36.0)
MCV: 124.1 fL — ABNORMAL HIGH (ref 80.0–100.0)
Platelets: 211 10*3/uL (ref 150–400)
RBC: 2.66 MIL/uL — ABNORMAL LOW (ref 4.22–5.81)
RDW: 19.3 % — ABNORMAL HIGH (ref 11.5–15.5)
WBC: 12.3 10*3/uL — ABNORMAL HIGH (ref 4.0–10.5)
nRBC: 0.2 % (ref 0.0–0.2)

## 2020-06-24 LAB — MAGNESIUM: Magnesium: 2.2 mg/dL (ref 1.7–2.4)

## 2020-06-24 LAB — RESP PANEL BY RT-PCR (FLU A&B, COVID) ARPGX2
Influenza A by PCR: NEGATIVE
Influenza B by PCR: NEGATIVE
SARS Coronavirus 2 by RT PCR: NEGATIVE

## 2020-06-24 LAB — VITAMIN B12: Vitamin B-12: 168 pg/mL — ABNORMAL LOW (ref 180–914)

## 2020-06-24 MED ORDER — INFLUENZA VAC SPLIT QUAD 0.5 ML IM SUSY
0.5000 mL | PREFILLED_SYRINGE | INTRAMUSCULAR | Status: AC
  Administered 2020-06-25: 19:00:00 0.5 mL via INTRAMUSCULAR
  Filled 2020-06-24: qty 0.5

## 2020-06-24 MED ORDER — POTASSIUM CHLORIDE IN NACL 40-0.9 MEQ/L-% IV SOLN
INTRAVENOUS | Status: DC
  Filled 2020-06-24 (×2): qty 1000

## 2020-06-24 MED ORDER — POTASSIUM CHLORIDE CRYS ER 20 MEQ PO TBCR
40.0000 meq | EXTENDED_RELEASE_TABLET | ORAL | Status: AC
  Administered 2020-06-24: 10:00:00 40 meq via ORAL
  Filled 2020-06-24: qty 2

## 2020-06-24 MED ORDER — HYDROCODONE-ACETAMINOPHEN 5-325 MG PO TABS
1.0000 | ORAL_TABLET | ORAL | Status: DC | PRN
  Administered 2020-06-24 – 2020-06-29 (×11): 1 via ORAL
  Filled 2020-06-24 (×11): qty 1

## 2020-06-24 MED ORDER — POTASSIUM CHLORIDE CRYS ER 20 MEQ PO TBCR
30.0000 meq | EXTENDED_RELEASE_TABLET | Freq: Once | ORAL | Status: AC
  Administered 2020-06-24: 13:00:00 30 meq via ORAL
  Filled 2020-06-24: qty 1

## 2020-06-24 MED ORDER — SODIUM CHLORIDE 0.9 % IV SOLN: INTRAVENOUS | Status: DC

## 2020-06-24 MED ORDER — POTASSIUM CHLORIDE CRYS ER 20 MEQ PO TBCR
20.0000 meq | EXTENDED_RELEASE_TABLET | Freq: Once | ORAL | Status: AC
  Administered 2020-06-24: 15:00:00 20 meq via ORAL
  Filled 2020-06-24: qty 1

## 2020-06-24 MED ORDER — POTASSIUM CHLORIDE CRYS ER 20 MEQ PO TBCR: 40.0000 meq | EXTENDED_RELEASE_TABLET | Freq: Once | ORAL | Status: DC

## 2020-06-24 NOTE — Progress Notes (Addendum)
PROGRESS NOTE    LIVINGSTON LAROCCA  PVX:[id number] DOB: January 21, 1961 DOA: 06/23/2020 PCP: Patient, No Pcp Per   Chief Complaint  Patient presents with  . PAD  Brief Narrative: 59yo Male with chronic systolic CHF lvef 20-25%, coronary artery disease, history of ST elevation MI, PVD, hyperlipidemia, hypertension presented to the ED due to worsening numbness tingling and burning sensation and pain in the left lower extremity. Patient reports difficulty bear weight and difficultyto  ambulate at home.  He has had this pain for months and had worsened on the day of admission.  He is followed by Dr. Festus Barren from vascular and is scheduled to undergo angiography of left lower extremity 12/20. he was seen in the ED and on routine work-up showed significant hypokalemia and patient was admitted due to that.  In the ED hypokalemia and renal failure with creatinine 1.5, 4 months ago around 1.1. Potassium replaced placed on IV fluids and was admitted. Potassium improving renal failure improving.  Subjective: Patient was doing well on room air this morning however nursing reports he desaturates during nap Potassium is improving.  Patient complains significant pain on the left lower extremity unable to bear weight feels reluctant to go home   Assessment & Plan:  Hypokalemia: k is still low-repleting aggressively.  Monitor BMP.  Lasix on hold Hypomagnesemia-was replaced. Recent Labs  Lab 06/23/20 1652 06/23/20 1830 06/24/20 0352 06/24/20 1355  K 2.2* 2.4* 3.1* 3.4*   AKI, baseline creat 1.1 4 months ago:on admission 1.5. improving on ivf- will cut down ivf given low ef monitor bmp today. His Lasix and ACE inhibitor on hold Recent Labs  Lab 06/23/20 1652 06/24/20 0352 06/24/20 1355  BUN 23* 20 20  CREATININE 1.56* 1.37* 1.17   Left lower extremity pain ongoing for months with somewhat worsening with  PAD on apixaban and Lipitor.Patient is scheduled for angiography 06/25/2020. He has  dopplerable pulse on LLE. He would like ot have procedure done while he is here. I will send secure chat message to Dr Wyn Quaker.   Mild leukocytosis ongoing for several months ranging 11 to 13.   Chronic systolic CHF EF 20 to 25% on his last echo on February 17, 2020, also with moderate elevated pulmonary artery systolic pressure.  Lasix on hold due to hypokalemia and renal failure.  Continue Coreg. Essential hypertension: Blood pressure is stable continue his Coreg Hyperlipidemia: Continue with Lipitor CAD denies any chest pain.  Continue his apixaban, statin and Coreg  Nutrition: Diet Order            Diet Heart Room service appropriate? Yes; Fluid consistency: Thin  Diet effective now                 Body mass index is 28.51 kg/m.  DVT prophylaxis: eliquis Code Status:   Code Status: Full Code  Family Communication: plan of care discussed with patient at bedside.  Status is: admitted as Observation continues to remain hospitalized  For ongoing LLE pain unabel to ambualte, pending angio on leg tomorrow.  Dispo: The patient is from: Home              Anticipated d/c is to: Home              Anticipated d/c date is: 2 days              Patient currently is not medically stable to d/c.         Consultants:see note  Procedures:see note  Culture/Microbiology    Component Value Date/Time   SDES BLOOD BLOOD LEFT FOREARM 02/17/2020 0901   SDES BLOOD BLOOD RIGHT FOREARM 02/17/2020 0901   SPECREQUEST  02/17/2020 0901    BOTTLES DRAWN AEROBIC AND ANAEROBIC Blood Culture adequate volume   SPECREQUEST  02/17/2020 0901    BOTTLES DRAWN AEROBIC AND ANAEROBIC Blood Culture adequate volume   CULT  02/17/2020 0901    NO GROWTH 5 DAYS Performed at [facility], [address] [place] [place], [place] [address]    CULT  02/17/2020 0901    NO GROWTH 5 DAYS Performed at [facility], [address] [place], [place] [address]    REPTSTATUS 02/22/2020 FINAL 02/17/2020 0901    REPTSTATUS 02/22/2020 FINAL 02/17/2020 0901    Other culture-see note  Medications: Scheduled Meds: . apixaban  5 mg Oral BID  . atorvastatin  80 mg Oral Daily  . carvedilol  25 mg Oral BID WC  . colchicine  0.6 mg Oral Daily  . ferrous sulfate  325 mg Oral BID WC  . folic acid  1 mg Oral Daily  . vitamin B-12  1,000 mcg Oral Daily   Continuous Infusions:   Antimicrobials: Anti-infectives (From admission, onward)   None     Objective: Vitals: Today's Vitals   06/24/20 1305 06/24/20 1400 06/24/20 1435 06/24/20 1506  BP:      Pulse:      Resp:  16    Temp:      TempSrc:      SpO2:  97%    Weight:      Height:      PainSc: Asleep  8  Asleep    Intake/Output Summary (Last 24 hours) at 06/24/2020 1549 Last data filed at 06/24/2020 1500 Gross per 24 hour  Intake 291.82 ml  Output --  Net 291.82 ml   Filed Weights   06/23/20 1651  Weight: 82.6 kg   Weight change:   Intake/Output from previous day: 12/18 0701 - 12/19 0700 In: 150 [IV Piggyback:150] Out: -  Intake/Output this shift: Total I/O In: 141.8 [I.V.:141.8] Out: -   Examination: General exam: AAOx3 ,NAD, weak appearing. HEENT:Oral mucosa moist, Ear/Nose WNL grossly,dentition normal. Respiratory system: bilaterally clear,no wheezing or crackles,no use of accessory muscle, non tender. Cardiovascular system: S1 & S2 +, regular, No JVD. Gastrointestinal system: Abdomen soft, NT,ND, BS+. Nervous System:Alert, awake, moving extremities and grossly nonfocal Extremities: No edema, distal peripheral pulses dopplerable b/l. Skin: No rashes,no icterus. MSK: Normal muscle bulk,tone, power  Data Reviewed: I have personally reviewed following labs and imaging studies CBC: Recent Labs  Lab 06/23/20 1652 06/24/20 0352  WBC 11.7* 12.3*  HGB 10.5* 10.7*  HCT 31.1* 33.0*  MCV 121.0* 124.1*  PLT 208 211   Basic Metabolic Panel: Recent Labs  Lab 06/23/20 1652 06/23/20 1830 06/24/20 0352  06/24/20 1355  NA 139  --  140 138  K 2.2* 2.4* 3.1* 3.4*  CL 96*  --  96* 96*  CO2 33*  --  32 31  GLUCOSE 123*  --  124* 122*  BUN 23*  --  20 20  CREATININE 1.56*  --  1.37* 1.17  CALCIUM 8.3*  --  8.5* 8.4*  MG  --  1.5* 2.2  --   PHOS  --  3.1  --   --    GFR: Estimated Creatinine Clearance: 70.8 mL/min (by C-G formula based on SCr of 1.17 mg/dL). Liver Function Tests: Recent Labs  Lab 06/24/20 959 105 0020  AST 15  ALT 7  ALKPHOS 74  BILITOT 0.8  PROT 7.1  ALBUMIN 3.2*   No results for input(s): LIPASE, AMYLASE in the last 168 hours. No results for input(s): AMMONIA in the last 168 hours. Coagulation Profile: No results for input(s): INR, PROTIME in the last 168 hours. Cardiac Enzymes: No results for input(s): CKTOTAL, CKMB, CKMBINDEX, TROPONINI in the last 168 hours. BNP (last 3 results) No results for input(s): PROBNP in the last 8760 hours. HbA1C: No results for input(s): HGBA1C in the last 72 hours. CBG: No results for input(s): GLUCAP in the last 168 hours. Lipid Profile: No results for input(s): CHOL, HDL, LDLCALC, TRIG, CHOLHDL, LDLDIRECT in the last 72 hours. Thyroid Function Tests: No results for input(s): TSH, T4TOTAL, FREET4, T3FREE, THYROIDAB in the last 72 hours. Anemia Panel: Recent Labs    06/23/20 2153  VITAMINB12 168*  FOLATE 4.7*  FERRITIN 249  TIBC 263  IRON 44*  RETICCTPCT 4.2*   Sepsis Labs: No results for input(s): PROCALCITON, LATICACIDVEN in the last 168 hours.  Recent Results (from the past 240 hour(s))  SARS CORONAVIRUS 2 (TAT 6-24 HRS) Nasopharyngeal Nasopharyngeal Swab     Status: None   Collection Time: 06/21/20 11:56 AM   Specimen: Nasopharyngeal Swab  Result Value Ref Range Status   SARS Coronavirus 2 NEGATIVE NEGATIVE Final    Comment: (NOTE) SARS-CoV-2 target nucleic acids are NOT DETECTED.  The SARS-CoV-2 RNA is generally detectable in upper and lower respiratory specimens during the acute phase of infection.  Negative results do not preclude SARS-CoV-2 infection, do not rule out co-infections with other pathogens, and should not be used as the sole basis for treatment or other patient management decisions. Negative results must be combined with clinical observations, patient history, and epidemiological information. The expected result is Negative.  Fact Sheet for Patients: HairSlick.no  Fact Sheet for Healthcare Providers: quierodirigir.com  This test is not yet approved or cleared by the [place] FDA and  has been authorized for detection and/or diagnosis of SARS-CoV-2 by FDA under an Emergency Use Authorization (EUA). This EUA will remain  in effect (meaning this test can be used) for the duration of the COVID-19 declaration under Se ction 564(b)(1) of the Act, 21 U.S.C. section 360bbb-3(b)(1), unless the authorization is terminated or revoked sooner.  Performed at [facility], [address] [place], [place] [address]   Resp Panel by RT-PCR (Flu A&B, Covid) Nasopharyngeal Swab     Status: None   Collection Time: 06/24/20  5:44 AM   Specimen: Nasopharyngeal Swab; Nasopharyngeal(NP) swabs in vial transport medium  Result Value Ref Range Status   SARS Coronavirus 2 by RT PCR NEGATIVE NEGATIVE Final    Comment: (NOTE) SARS-CoV-2 target nucleic acids are NOT DETECTED.  The SARS-CoV-2 RNA is generally detectable in upper respiratory specimens during the acute phase of infection. The lowest concentration of SARS-CoV-2 viral copies this assay can detect is 138 copies/mL. A negative result does not preclude SARS-Cov-2 infection and should not be used as the sole basis for treatment or other patient management decisions. A negative result may occur with  improper specimen collection/handling, submission of specimen other than nasopharyngeal swab, presence of viral mutation(s) within the areas targeted by this assay, and  inadequate number of viral copies(<138 copies/mL). A negative result must be combined with clinical observations, patient history, and epidemiological information. The expected result is Negative.  Fact Sheet for Patients:  BloggerCourse.com  Fact Sheet for Healthcare Providers:  SeriousBroker.it  This test is no t yet approved or cleared by the [place] and  has been authorized for detection and/or diagnosis of SARS-CoV-2 by FDA under an Emergency Use Authorization (EUA). This EUA will remain  in effect (meaning this test can be used) for the duration of the COVID-19 declaration under Section 564(b)(1) of the Act, 21 U.S.C.section 360bbb-3(b)(1), unless the authorization is terminated  or revoked sooner.       Influenza A by PCR NEGATIVE NEGATIVE Final   Influenza B by PCR NEGATIVE NEGATIVE Final    Comment: (NOTE) The Xpert Xpress SARS-CoV-2/FLU/RSV plus assay is intended as an aid in the diagnosis of influenza from Nasopharyngeal swab specimens and should not be used as a sole basis for treatment. Nasal washings and aspirates are unacceptable for Xpert Xpress SARS-CoV-2/FLU/RSV testing.  Fact Sheet for Patients: BloggerCourse.com  Fact Sheet for Healthcare Providers: SeriousBroker.it  This test is not yet approved or cleared by the [place] FDA and has been authorized for detection and/or diagnosis of SARS-CoV-2 by FDA under an Emergency Use Authorization (EUA). This EUA will remain in effect (meaning this test can be used) for the duration of the COVID-19 declaration under Section 564(b)(1) of the Act, 21 U.S.C. section 360bbb-3(b)(1), unless the authorization is terminated or revoked.  Performed at [facility], [address], [place], [place] [address]      Radiology Studies: No results found.   LOS: 0 days   Lanae Boast, MD [facility]  06/24/2020, 3:49 PM

## 2020-06-24 NOTE — ED Notes (Addendum)
Guttenberg removed per MD request as pt was 100% on [place]. Pt did however desat on RA while napping, appeared in no distress when I checked on him, placed back on 2L [place], MD notified by secure chat

## 2020-06-24 NOTE — ED Notes (Signed)
Answering call bell pt requesting urinal and coke to drink. Pt given urinal and provided a coke.

## 2020-06-24 NOTE — Evaluation (Signed)
Physical Therapy Evaluation Patient Details Name: Cameron [name] MRN: [id number] DOB: 1961-02-17 Today's Date: 06/24/2020   History of Present Illness  59 yo male with onset of numbness and cold feeling with burning pain on L foot and ankle came to ED after 1.5 weeks.  Has been unable to walk without RW, unable to work as Merchandiser, retail in [employer], [employer] (consulting).  Noted low K+, AKI, PMHx:  PAD, CAD, anemia, CHF, STEMI, HLD, HTN,  Clinical Impression  Pt is up to side of bed with no physical assist but did talk with him about the challenges of getting there.  He is in dramatically more pain with any use of L foot and ankle, but without it is fairly comfortable.  Has been walking with HHA on walls and furniture to make it through work, now unable to even tolerate standing on that leg.  Has noticeable mm atrophy on LLE along with weaker mm tests, and will be having a procedure on 12/20 to try to revascularize the limb.  Will follow along to see how well pt tolerates the surgery and what updates to current functional level there are.    Follow Up Recommendations Home health PT;Supervision - Intermittent    Equipment Recommendations  None recommended by PT (will reassess after procedure)    Recommendations for Other Services       Precautions / Restrictions Precautions Precautions: Fall Precaution Comments: may need pain meds pretherapy Restrictions Weight Bearing Restrictions: No Other Position/Activity Restrictions: does not tolerate WB on LLE      Mobility  Bed Mobility Overal bed mobility: Needs Assistance Bed Mobility: Supine to Sit;Sit to Supine     Supine to sit: Supervision Sit to supine: Supervision   General bed mobility comments: supervised for safety and    Transfers Overall transfer level: Needs assistance Equipment used: 1 person hand held assist Transfers: Sit to/from Stand Sit to Stand: Supervision         General transfer comment: supervision on side of bed to  stand with NWB on LLE due to pain  Ambulation/Gait             General Gait Details: pt declined  Stairs            Wheelchair Mobility    Modified Rankin (Stroke Patients Only)       Balance Overall balance assessment: Needs assistance Sitting-balance support: Feet supported Sitting balance-Leahy Scale: Good     Standing balance support: Bilateral upper extremity supported;During functional activity Standing balance-Leahy Scale: Poor Standing balance comment: using light UE support to balance wiht LLE NWB                             Pertinent Vitals/Pain Pain Assessment: Faces Faces Pain Scale: Hurts little more Pain Location: L foot dorsum and ankle Pain Descriptors / Indicators: Burning;Guarding Pain Intervention(s): Limited activity within patient's tolerance;Monitored during session;Premedicated before session;Repositioned    Home Living Family/patient expects to be discharged to:: Private residence Living Arrangements: Alone Available Help at Discharge: Family;Friend(s);Available PRN/intermittently Type of Home: House Home Access: Stairs to enter Entrance Stairs-Rails: Right;Left   Home Layout: One level Home Equipment: Walker - 2 wheels;Crutches;Shower seat Additional Comments: has only been using an AD for 2 weeks    Prior Function Level of Independence: Independent               Hand Dominance   Dominant Hand: Right    Extremity/Trunk Assessment  Upper Extremity Assessment Upper Extremity Assessment: Overall WFL for tasks assessed    Lower Extremity Assessment Lower Extremity Assessment: LLE deficits/detail LLE Deficits / Details: generalized weakness and mm atrophy LLE Sensation: decreased light touch LLE Coordination: decreased gross motor;decreased fine motor    Cervical / Trunk Assessment Cervical / Trunk Assessment: Normal  Communication   Communication: No difficulties  Cognition Arousal/Alertness:  Awake/alert Behavior During Therapy: WFL for tasks assessed/performed Overall Cognitive Status: Within Functional Limits for tasks assessed                                 General Comments: Pt is asking to avoid a lot of use of LLE due to meds being effective at the management of his intense pain that brought him in      General Comments General comments (skin integrity, edema, etc.): pt is up to side of bed with ease but avoiding use of LLE as even mm testing was quite painful    Exercises     Assessment/Plan    PT Assessment Patient needs continued PT services  PT Problem List Decreased strength;Decreased range of motion;Decreased activity tolerance;Decreased balance;Decreased mobility;Decreased coordination;Decreased knowledge of use of DME;Decreased safety awareness;Cardiopulmonary status limiting activity;Pain;Impaired sensation       PT Treatment Interventions DME instruction;Gait training;Stair training;Functional mobility training;Therapeutic activities;Therapeutic exercise;Balance training;Neuromuscular re-education;Patient/family education    PT Goals (Current goals can be found in the Care Plan section)  Acute Rehab PT Goals Patient Stated Goal: to get pain relief and back to work PT Goal Formulation: With patient Time For Goal Achievement: 07/01/20 Potential to Achieve Goals: Good    Frequency Min 2X/week   Barriers to discharge Inaccessible home environment;Decreased caregiver support home alone with 4 steps to enter house    Co-evaluation               AM-PAC PT "6 Clicks" Mobility  Outcome Measure Help needed turning from your back to your side while in a flat bed without using bedrails?: None Help needed moving from lying on your back to sitting on the side of a flat bed without using bedrails?: A Little Help needed moving to and from a bed to a chair (including a wheelchair)?: A Little Help needed standing up from a chair using your arms  (e.g., wheelchair or bedside chair)?: A Little Help needed to walk in hospital room?: A Little Help needed climbing 3-5 steps with a railing? : A Lot 6 Click Score: 18    End of Session Equipment Utilized During Treatment: Gait belt;Oxygen Activity Tolerance: Patient limited by pain Patient left: in bed;with call bell/phone within reach Nurse Communication: Mobility status PT Visit Diagnosis: Unsteadiness on feet (R26.81);Muscle weakness (generalized) (M62.81);Pain Pain - Right/Left: Left Pain - part of body: Ankle and joints of foot    Time: 1191-4782 PT Time Calculation (min) (ACUTE ONLY): 32 min   Charges:   PT Evaluation $PT Eval Moderate Complexity: 1 Mod PT Treatments $Therapeutic Activity: 8-22 mins       Ivar Drape 06/24/2020, 12:53 PM  Samul Dada, PT MS Acute Rehab Dept. Number: [facility] [phone] and [facility] [phone]

## 2020-06-24 NOTE — ED Notes (Signed)
This RN informed Jon Billings NP that covid swab not ordered yet on patient for admission.

## 2020-06-25 ENCOUNTER — Encounter
Admission: EM | Disposition: A | Discharge status: Home Health | Payer: Self-pay | Source: Home / Self Care | Attending: Internal Medicine

## 2020-06-25 ENCOUNTER — Ambulatory Visit
Admission: RE | Admit: 2020-06-25 | Payer: Commercial Managed Care - PPO | Source: Home / Self Care | Admitting: Vascular Surgery

## 2020-06-25 ENCOUNTER — Other Ambulatory Visit (INDEPENDENT_AMBULATORY_CARE_PROVIDER_SITE_OTHER): Payer: Self-pay | Admitting: Nurse Practitioner

## 2020-06-25 DIAGNOSIS — E785 Hyperlipidemia, unspecified: Secondary | ICD-10-CM | POA: Diagnosis present

## 2020-06-25 DIAGNOSIS — I5023 Acute on chronic systolic (congestive) heart failure: Secondary | ICD-10-CM | POA: Diagnosis present

## 2020-06-25 DIAGNOSIS — I70222 Atherosclerosis of native arteries of extremities with rest pain, left leg: Secondary | ICD-10-CM | POA: Diagnosis present

## 2020-06-25 DIAGNOSIS — E538 Deficiency of other specified B group vitamins: Secondary | ICD-10-CM | POA: Diagnosis present

## 2020-06-25 DIAGNOSIS — N179 Acute kidney failure, unspecified: Secondary | ICD-10-CM | POA: Diagnosis present

## 2020-06-25 DIAGNOSIS — Z79899 Other long term (current) drug therapy: Secondary | ICD-10-CM | POA: Diagnosis not present

## 2020-06-25 DIAGNOSIS — Z955 Presence of coronary angioplasty implant and graft: Secondary | ICD-10-CM | POA: Diagnosis not present

## 2020-06-25 DIAGNOSIS — E876 Hypokalemia: Secondary | ICD-10-CM | POA: Diagnosis present

## 2020-06-25 DIAGNOSIS — N1831 Chronic kidney disease, stage 3a: Secondary | ICD-10-CM | POA: Diagnosis present

## 2020-06-25 DIAGNOSIS — M79662 Pain in left lower leg: Secondary | ICD-10-CM | POA: Diagnosis present

## 2020-06-25 DIAGNOSIS — D539 Nutritional anemia, unspecified: Secondary | ICD-10-CM | POA: Diagnosis present

## 2020-06-25 DIAGNOSIS — Z7901 Long term (current) use of anticoagulants: Secondary | ICD-10-CM | POA: Diagnosis not present

## 2020-06-25 DIAGNOSIS — I252 Old myocardial infarction: Secondary | ICD-10-CM | POA: Diagnosis not present

## 2020-06-25 DIAGNOSIS — J9621 Acute and chronic respiratory failure with hypoxia: Secondary | ICD-10-CM | POA: Diagnosis not present

## 2020-06-25 DIAGNOSIS — Z20822 Contact with and (suspected) exposure to covid-19: Secondary | ICD-10-CM | POA: Diagnosis present

## 2020-06-25 DIAGNOSIS — M109 Gout, unspecified: Secondary | ICD-10-CM | POA: Diagnosis present

## 2020-06-25 DIAGNOSIS — J9601 Acute respiratory failure with hypoxia: Secondary | ICD-10-CM | POA: Diagnosis not present

## 2020-06-25 DIAGNOSIS — I130 Hypertensive heart and chronic kidney disease with heart failure and stage 1 through stage 4 chronic kidney disease, or unspecified chronic kidney disease: Secondary | ICD-10-CM | POA: Diagnosis present

## 2020-06-25 DIAGNOSIS — I2510 Atherosclerotic heart disease of native coronary artery without angina pectoris: Secondary | ICD-10-CM | POA: Diagnosis present

## 2020-06-25 DIAGNOSIS — I5021 Acute systolic (congestive) heart failure: Secondary | ICD-10-CM | POA: Diagnosis not present

## 2020-06-25 DIAGNOSIS — I739 Peripheral vascular disease, unspecified: Secondary | ICD-10-CM | POA: Diagnosis not present

## 2020-06-25 DIAGNOSIS — Z23 Encounter for immunization: Secondary | ICD-10-CM | POA: Diagnosis not present

## 2020-06-25 DIAGNOSIS — I959 Hypotension, unspecified: Secondary | ICD-10-CM | POA: Diagnosis not present

## 2020-06-25 DIAGNOSIS — F17210 Nicotine dependence, cigarettes, uncomplicated: Secondary | ICD-10-CM | POA: Diagnosis present

## 2020-06-25 DIAGNOSIS — D509 Iron deficiency anemia, unspecified: Secondary | ICD-10-CM | POA: Diagnosis present

## 2020-06-25 HISTORY — PX: LOWER EXTREMITY ANGIOGRAPHY: CATH118251

## 2020-06-25 LAB — BASIC METABOLIC PANEL
Anion gap: 10 (ref 5–15)
BUN: 22 mg/dL — ABNORMAL HIGH (ref 6–20)
CO2: 32 mmol/L (ref 22–32)
Calcium: 8.5 mg/dL — ABNORMAL LOW (ref 8.9–10.3)
Chloride: 97 mmol/L — ABNORMAL LOW (ref 98–111)
Creatinine, Ser: 1.25 mg/dL — ABNORMAL HIGH (ref 0.61–1.24)
GFR, Estimated: >60 mL/min (ref >60)
Glucose, Bld: 92 mg/dL (ref 70–99)
Potassium: 4.5 mmol/L (ref 3.5–5.1)
Sodium: 139 mmol/L (ref 135–145)

## 2020-06-25 LAB — CBC
HCT: 29.2 % — ABNORMAL LOW (ref 39.0–52.0)
Hemoglobin: 9.6 g/dL — ABNORMAL LOW (ref 13.0–17.0)
MCH: 40.9 pg — ABNORMAL HIGH (ref 26.0–34.0)
MCHC: 32.9 g/dL (ref 30.0–36.0)
MCV: 124.3 fL — ABNORMAL HIGH (ref 80.0–100.0)
Platelets: 178 10*3/uL (ref 150–400)
RBC: 2.35 MIL/uL — ABNORMAL LOW (ref 4.22–5.81)
RDW: 18.4 % — ABNORMAL HIGH (ref 11.5–15.5)
WBC: 9.4 10*3/uL (ref 4.0–10.5)
nRBC: 0 % (ref 0.0–0.2)

## 2020-06-25 SURGERY — LOWER EXTREMITY ANGIOGRAPHY
Anesthesia: Moderate Sedation | Site: Leg Lower | Laterality: Left

## 2020-06-25 MED ORDER — HYDROMORPHONE HCL 1 MG/ML IJ SOLN
1.0000 mg | Freq: Once | INTRAMUSCULAR | Status: AC | PRN
Start: 2020-06-25 — End: 2020-06-25

## 2020-06-25 MED ORDER — HYDROMORPHONE HCL 1 MG/ML IJ SOLN
INTRAMUSCULAR | Status: AC
  Administered 2020-06-25: 13:00:00 1 mg via INTRAVENOUS
  Filled 2020-06-25: qty 1

## 2020-06-25 MED ORDER — DIPHENHYDRAMINE HCL 50 MG/ML IJ SOLN: 50.0000 mg | Freq: Once | INTRAMUSCULAR | Status: DC | PRN

## 2020-06-25 MED ORDER — FAMOTIDINE 20 MG PO TABS: 40.0000 mg | ORAL_TABLET | Freq: Once | ORAL | Status: DC | PRN

## 2020-06-25 MED ORDER — METHYLPREDNISOLONE SODIUM SUCC 125 MG IJ SOLR: 125.0000 mg | Freq: Once | INTRAMUSCULAR | Status: DC | PRN

## 2020-06-25 MED ORDER — CARVEDILOL 6.25 MG PO TABS
6.2500 mg | ORAL_TABLET | Freq: Two times a day (BID) | ORAL | Status: DC
  Administered 2020-06-25 – 2020-06-27 (×4): 6.25 mg via ORAL
  Filled 2020-06-25 (×4): qty 1

## 2020-06-25 MED ORDER — IPRATROPIUM-ALBUTEROL 0.5-2.5 (3) MG/3ML IN SOLN
3.0000 mL | Freq: Once | RESPIRATORY_TRACT | Status: AC
  Administered 2020-06-25: 13:00:00 3 mL via RESPIRATORY_TRACT

## 2020-06-25 MED ORDER — MIDAZOLAM HCL 5 MG/5ML IJ SOLN
INTRAMUSCULAR | Status: AC
  Filled 2020-06-25: qty 5

## 2020-06-25 MED ORDER — MIDAZOLAM HCL 2 MG/2ML IJ SOLN
INTRAMUSCULAR | Status: DC | PRN
  Administered 2020-06-25 (×3): 1 mg via INTRAVENOUS

## 2020-06-25 MED ORDER — DIPHENHYDRAMINE HCL 50 MG/ML IJ SOLN
INTRAMUSCULAR | Status: DC | PRN
  Administered 2020-06-25: 50 mg via INTRAVENOUS

## 2020-06-25 MED ORDER — FENTANYL CITRATE (PF) 100 MCG/2ML IJ SOLN
INTRAMUSCULAR | Status: DC | PRN
  Administered 2020-06-25: 25 ug via INTRAVENOUS
  Administered 2020-06-25: 50 ug via INTRAVENOUS
  Administered 2020-06-25: 25 ug via INTRAVENOUS

## 2020-06-25 MED ORDER — DIPHENHYDRAMINE HCL 50 MG/ML IJ SOLN
INTRAMUSCULAR | Status: AC
  Filled 2020-06-25: qty 1

## 2020-06-25 MED ORDER — CEFAZOLIN SODIUM-DEXTROSE 2-4 GM/100ML-% IV SOLN: 2.0000 g | Freq: Once | INTRAVENOUS | Status: AC

## 2020-06-25 MED ORDER — SODIUM CHLORIDE 0.9 % IV SOLN: INTRAVENOUS | Status: DC

## 2020-06-25 MED ORDER — NITROGLYCERIN 1 MG/10 ML FOR IR/CATH LAB
INTRA_ARTERIAL | Status: DC | PRN
  Administered 2020-06-25: 200 ug

## 2020-06-25 MED ORDER — HEPARIN SODIUM (PORCINE) 1000 UNIT/ML IJ SOLN
INTRAMUSCULAR | Status: AC
  Filled 2020-06-25: qty 1

## 2020-06-25 MED ORDER — MIDAZOLAM HCL 2 MG/ML PO SYRP: 8.0000 mg | ORAL_SOLUTION | Freq: Once | ORAL | Status: DC | PRN

## 2020-06-25 MED ORDER — CEFAZOLIN SODIUM-DEXTROSE 2-4 GM/100ML-% IV SOLN
INTRAVENOUS | Status: AC
  Administered 2020-06-25: 11:00:00 2 g via INTRAVENOUS
  Filled 2020-06-25: qty 100

## 2020-06-25 MED ORDER — HEPARIN SODIUM (PORCINE) 1000 UNIT/ML IJ SOLN
INTRAMUSCULAR | Status: DC | PRN
  Administered 2020-06-25: 6000 [IU] via INTRAVENOUS

## 2020-06-25 MED ORDER — FENTANYL CITRATE (PF) 100 MCG/2ML IJ SOLN
INTRAMUSCULAR | Status: AC
  Filled 2020-06-25: qty 2

## 2020-06-25 MED ORDER — ONDANSETRON HCL 4 MG/2ML IJ SOLN: 4.0000 mg | Freq: Four times a day (QID) | INTRAMUSCULAR | Status: DC | PRN

## 2020-06-25 SURGICAL SUPPLY — 32 items
BALLN LUTONIX 018 5X150X130 (BALLOONS) ×3
BALLN LUTONIX 018 5X300X130 (BALLOONS) ×3
BALLN LUTONIX 018 5X60X130 (BALLOONS) ×3
BALLN LUTONIX 018 6X300X130 (BALLOONS) ×3
BALLN LUTONIX 7X80X130 (BALLOONS) ×3
BALLN ULTRVRSE 3X300X150 (BALLOONS) ×2
BALLN ULTRVRSE 3X300X150 OTW (BALLOONS) ×1
BALLOON LUTONIX 018 5X150X130 (BALLOONS) ×1 IMPLANT
BALLOON LUTONIX 018 5X300X130 (BALLOONS) ×1 IMPLANT
BALLOON LUTONIX 018 5X60X130 (BALLOONS) ×1 IMPLANT
BALLOON LUTONIX 018 6X300X130 (BALLOONS) ×1 IMPLANT
BALLOON LUTONIX 7X80X130 (BALLOONS) ×1 IMPLANT
BALLOON ULTRVRSE 3X300X150 OTW (BALLOONS) ×1 IMPLANT
CATH ANGIO 5F PIGTAIL 65CM (CATHETERS) ×3 IMPLANT
CATH BEACON 5 .035 65 KMP TIP (CATHETERS) ×3 IMPLANT
CATH BEACON 5 .038 100 VERT TP (CATHETERS) ×3 IMPLANT
DEVICE SAFEGUARD 24CM (GAUZE/BANDAGES/DRESSINGS) ×3 IMPLANT
DEVICE STARCLOSE SE CLOSURE (Vascular Products) ×3 IMPLANT
DEVICE TORQUE .025-.038 (MISCELLANEOUS) ×3 IMPLANT
GLIDEWIRE ADV .035X260CM (WIRE) ×3 IMPLANT
KIT ENCORE 26 ADVANTAGE (KITS) ×3 IMPLANT
PACK ANGIOGRAPHY (CUSTOM PROCEDURE TRAY) ×3 IMPLANT
SHEATH ANL2 6FRX45 HC (SHEATH) ×3 IMPLANT
SHEATH BRITE TIP 5FRX11 (SHEATH) ×3 IMPLANT
STENT LIFESTAR 9X80 (Permanent Stent) ×3 IMPLANT
STENT VIABAHN 6X150X120 (Permanent Stent) ×3 IMPLANT
STENT VIABAHN 6X250X120 (Permanent Stent) ×3 IMPLANT
STENT VIABAHN 6X50X120 (Permanent Stent) ×3 IMPLANT
SYR MEDRAD MARK 7 150ML (SYRINGE) ×3 IMPLANT
TUBING CONTRAST HIGH PRESS 72 (TUBING) ×3 IMPLANT
WIRE G V18X300CM (WIRE) ×3 IMPLANT
WIRE GUIDERIGHT .035X150 (WIRE) ×3 IMPLANT

## 2020-06-25 NOTE — Progress Notes (Signed)
PROGRESS NOTE    Cameron [name]  ZES:[id number] DOB: [birth year] DOA: 06/23/2020 PCP: Patient, No Pcp Per   Chief Complaint  Patient presents with  . PAD  Brief Narrative: 59yo Male with chronic systolic CHF lvef 20-25%, coronary artery disease, history of ST elevation MI, PVD, hyperlipidemia, hypertension presented to the ED due to worsening numbness tingling and burning sensation and pain in the left lower extremity. Patient reports difficulty bear weight and difficultyto  ambulate at home.  He has had this pain for months and had worsened on the day of admission.  He is followed by Dr. Festus Barren from vascular and is scheduled to undergo angiography of left lower extremity 12/20. he was seen in the ED and on routine work-up showed significant hypokalemia and patient was admitted due to that.  In the ED hypokalemia and renal failure with creatinine 1.5, 4 months ago around 1.1  Subjective: Reports he feels fine, is waiting for procedure this morning complains of pain on the left lower extremities.   Denies shortness of breath orthopnea chest pain fever chills.   Assessment & Plan:  Hypokalemia: likely from his diuresis.  Resolved.   Hypomagnesemia resolved  AKI, baseline creat 1.1 abt 4 months ago:on admission 1.5. improved to 1.17 and at 1.25 thsi am.  Seen IV fluids but given his low EF no further fluids continued.His Lasix and ACE inhibitor remains on hold.  Monitor closely as he is going for angiography. Recent Labs  Lab 06/23/20 1652 06/24/20 0352 06/24/20 1355 06/25/20 0300  BUN 23* 20 20 22*  CREATININE 1.56* 1.37* 1.17 1.25*   PVD with Left lower extremity pain ongoing for months , on apixaban-held preop this morning and resume once okay with vascular, continue Lipitor.Patient was scheduled for angiography 06/25/2020 and Dr Wyn Quaker planning same while here. Pain is limiting his activities and ambulation.   Mild leukocytosis ongoing for several months ranging 11 to 13.  It  has resolved dissemination.  Anemia likely from chronic disease.  Hemoglobin stable monitor.  She is on Eliquis.   Chronic systolic CHF EF 20 to 25% on his last echo on February 17, 2020, also with moderate elevated pulmonary artery systolic pressure.  Lasix, lisinopril on hold due to hypokalemia and renal failure.  Continue Coreg at lower dose due to soft blood pressure this morning.  Denies any orthopnea shortness of breath, monitor signs for fluid overload resume diuretics and ACE inhibitor once creatinine improves  Essential hypertension: Soft this morning continue on medium dose  Coreg. Hyperlipidemia: Continue with Lipitor CAD denies any chest pain.cont statin and Coreg  Nutrition: Diet Order            Diet NPO time specified  Diet effective midnight                 Body mass index is 28.66 kg/m.  DVT prophylaxis: eliquis Code Status:   Code Status: Full Code  Family Communication: plan of care discussed with patient at bedside.  Status is: admitted as Observation continues to remain hospitalized  For ongoing LLE pain unable to ambualte, and needing further intervention angiography   Dispo: The patient is from: Home              Anticipated d/c is to: Home, pending PTOT psot op.              Anticipated d/c date is: 2 days              Patient currently  is not medically stable to d/c.  Consultants:see note  Procedures:see note  Culture/Microbiology    Component Value Date/Time   SDES BLOOD BLOOD LEFT FOREARM 02/17/2020 0901   SDES BLOOD BLOOD RIGHT FOREARM 02/17/2020 0901   SPECREQUEST  02/17/2020 0901    BOTTLES DRAWN AEROBIC AND ANAEROBIC Blood Culture adequate volume   SPECREQUEST  02/17/2020 0901    BOTTLES DRAWN AEROBIC AND ANAEROBIC Blood Culture adequate volume   CULT  02/17/2020 0901    NO GROWTH 5 DAYS Performed at [facility], [address] [place], [place] [address]    CULT  02/17/2020 0901    NO GROWTH 5 DAYS Performed at [facility], [address] [place] [place], [place] [address]    REPTSTATUS 02/22/2020 FINAL 02/17/2020 0901   REPTSTATUS 02/22/2020 FINAL 02/17/2020 0901    Other culture-see note  Medications: Scheduled Meds: . [MAR Hold] apixaban  5 mg Oral BID  . [MAR Hold] atorvastatin  80 mg Oral Daily  . [MAR Hold] carvedilol  6.25 mg Oral BID WC  . [MAR Hold] colchicine  0.6 mg Oral Daily  . fentaNYL      . [MAR Hold] ferrous sulfate  325 mg Oral BID WC  . [MAR Hold] folic acid  1 mg Oral Daily  . heparin sodium (porcine)      . influenza vac split quadrivalent PF  0.5 mL Intramuscular Tomorrow-1000  . midazolam      . [MAR Hold] vitamin B-12  1,000 mcg Oral Daily   Continuous Infusions: . sodium chloride    .  ceFAZolin (ANCEF) IV 2 g (06/25/20 1108)    Antimicrobials: Anti-infectives (From admission, onward)   Start     Dose/Rate Route Frequency Ordered Stop   06/25/20 1015  ceFAZolin (ANCEF) IVPB 2g/100 mL premix       Note to Pharmacy: To be given in specials   2 g 200 mL/hr over 30 Minutes Intravenous  Once 06/25/20 1009       Objective: Vitals: Today's Vitals   06/25/20 1105 06/25/20 1109 06/25/20 1110 06/25/20 1115  BP:      Pulse:      Resp:      Temp:      TempSrc:      SpO2:  93%    Weight:      Height:      PainSc: 9   Asleep Asleep    Intake/Output Summary (Last 24 hours) at 06/25/2020 1119 Last data filed at 06/25/2020 1000 Gross per 24 hour  Intake 141.82 ml  Output 620 ml  Net -478.18 ml   Filed Weights   06/23/20 1651 06/25/20 1034  Weight: 82.6 kg 83 kg   Weight change:   Intake/Output from previous day: 12/19 0701 - 12/20 0700 In: 141.8 [I.V.:141.8] Out: 300 [Urine:300] Intake/Output this shift: Total I/O In: -  Out: 320 [Urine:320]  Examination: General exam: AAOx3, on RA,, NAD, weak appearing. HEENT:Oral mucosa moist, Ear/Nose WNL grossly, dentition normal. Respiratory system: bilaterally clear,no wheezing or crackles,no use of  accessory muscle Cardiovascular system: S1 & S2 +, No JVD,. Gastrointestinal system: Abdomen soft, NT,ND, BS+ Nervous System:Alert, awake, moving extremities and grossly nonfocal Extremities: No edema, left foot slightly red and tender on foot, distal pulse able to be dopplered by nursing staff.  Skin: No rashes,no icterus. MSK: Normal muscle bulk,tone, power  Data Reviewed: I have personally reviewed following labs and imaging studies CBC: Recent Labs  Lab 06/23/20 1652 06/24/20 0352 06/25/20 0300  WBC 11.7* 12.3* 9.4  HGB 10.5* 10.7* 9.6*  HCT 31.1* 33.0* 29.2*  MCV 121.0* 124.1* 124.3*  PLT 208 211 178   Basic Metabolic Panel: Recent Labs  Lab 06/23/20 1652 06/23/20 1830 06/24/20 0352 06/24/20 1355 06/25/20 0300  NA 139  --  140 138 139  K 2.2* 2.4* 3.1* 3.4* 4.5  CL 96*  --  96* 96* 97*  CO2 33*  --  32 31 32  GLUCOSE 123*  --  124* 122* 92  BUN 23*  --  20 20 22*  CREATININE 1.56*  --  1.37* 1.17 1.25*  CALCIUM 8.3*  --  8.5* 8.4* 8.5*  MG  --  1.5* 2.2  --   --   PHOS  --  3.1  --   --   --    GFR: Estimated Creatinine Clearance: 66.4 mL/min (A) (by C-G formula based on SCr of 1.25 mg/dL (H)). Liver Function Tests: Recent Labs  Lab 06/24/20 0352  AST 15  ALT 7  ALKPHOS 74  BILITOT 0.8  PROT 7.1  ALBUMIN 3.2*   No results for input(s): LIPASE, AMYLASE in the last 168 hours. No results for input(s): AMMONIA in the last 168 hours. Coagulation Profile: No results for input(s): INR, PROTIME in the last 168 hours. Cardiac Enzymes: No results for input(s): CKTOTAL, CKMB, CKMBINDEX, TROPONINI in the last 168 hours. BNP (last 3 results) No results for input(s): PROBNP in the last 8760 hours. HbA1C: No results for input(s): HGBA1C in the last 72 hours. CBG: No results for input(s): GLUCAP in the last 168 hours. Lipid Profile: No results for input(s): CHOL, HDL, LDLCALC, TRIG, CHOLHDL, LDLDIRECT in the last 72 hours. Thyroid Function Tests: No results  for input(s): TSH, T4TOTAL, FREET4, T3FREE, THYROIDAB in the last 72 hours. Anemia Panel: Recent Labs    06/23/20 2153  VITAMINB12 168*  FOLATE 4.7*  FERRITIN 249  TIBC 263  IRON 44*  RETICCTPCT 4.2*   Sepsis Labs: No results for input(s): PROCALCITON, LATICACIDVEN in the last 168 hours.  Recent Results (from the past 240 hour(s))  SARS CORONAVIRUS 2 (TAT 6-24 HRS) Nasopharyngeal Nasopharyngeal Swab     Status: None   Collection Time: 06/21/20 11:56 AM   Specimen: Nasopharyngeal Swab  Result Value Ref Range Status   SARS Coronavirus 2 NEGATIVE NEGATIVE Final    Comment: (NOTE) SARS-CoV-2 target nucleic acids are NOT DETECTED.  The SARS-CoV-2 RNA is generally detectable in upper and lower respiratory specimens during the acute phase of infection. Negative results do not preclude SARS-CoV-2 infection, do not rule out co-infections with other pathogens, and should not be used as the sole basis for treatment or other patient management decisions. Negative results must be combined with clinical observations, patient history, and epidemiological information. The expected result is Negative.  Fact Sheet for Patients: HairSlick.no  Fact Sheet for Healthcare Providers: quierodirigir.com  This test is not yet approved or cleared by the [place] FDA and  has been authorized for detection and/or diagnosis of SARS-CoV-2 by FDA under an Emergency Use Authorization (EUA). This EUA will remain  in effect (meaning this test can be used) for the duration of the COVID-19 declaration under Se ction 564(b)(1) of the Act, 21 U.S.C. section 360bbb-3(b)(1), unless the authorization is terminated or revoked sooner.  Performed at [facility], [address] [place], [place] [address]   Resp Panel by RT-PCR (Flu A&B, Covid) Nasopharyngeal Swab     Status: None   Collection  Time: 06/24/20  5:44 AM   Specimen: Nasopharyngeal  Swab; Nasopharyngeal(NP) swabs in vial transport medium  Result Value Ref Range Status   SARS Coronavirus 2 by RT PCR NEGATIVE NEGATIVE Final    Comment: (NOTE) SARS-CoV-2 target nucleic acids are NOT DETECTED.  The SARS-CoV-2 RNA is generally detectable in upper respiratory specimens during the acute phase of infection. The lowest concentration of SARS-CoV-2 viral copies this assay can detect is 138 copies/mL. A negative result does not preclude SARS-Cov-2 infection and should not be used as the sole basis for treatment or other patient management decisions. A negative result may occur with  improper specimen collection/handling, submission of specimen other than nasopharyngeal swab, presence of viral mutation(s) within the areas targeted by this assay, and inadequate number of viral copies(<138 copies/mL). A negative result must be combined with clinical observations, patient history, and epidemiological information. The expected result is Negative.  Fact Sheet for Patients:  BloggerCourse.com  Fact Sheet for Healthcare Providers:  SeriousBroker.it  This test is no t yet approved or cleared by the [place] FDA and  has been authorized for detection and/or diagnosis of SARS-CoV-2 by FDA under an Emergency Use Authorization (EUA). This EUA will remain  in effect (meaning this test can be used) for the duration of the COVID-19 declaration under Section 564(b)(1) of the Act, 21 U.S.C.section 360bbb-3(b)(1), unless the authorization is terminated  or revoked sooner.       Influenza A by PCR NEGATIVE NEGATIVE Final   Influenza B by PCR NEGATIVE NEGATIVE Final    Comment: (NOTE) The Xpert Xpress SARS-CoV-2/FLU/RSV plus assay is intended as an aid in the diagnosis of influenza from Nasopharyngeal swab specimens and should not be used as a sole basis for treatment. Nasal washings and aspirates are unacceptable for Xpert Xpress  SARS-CoV-2/FLU/RSV testing.  Fact Sheet for Patients: BloggerCourse.com  Fact Sheet for Healthcare Providers: SeriousBroker.it  This test is not yet approved or cleared by the [place] FDA and has been authorized for detection and/or diagnosis of SARS-CoV-2 by FDA under an Emergency Use Authorization (EUA). This EUA will remain in effect (meaning this test can be used) for the duration of the COVID-19 declaration under Section 564(b)(1) of the Act, 21 U.S.C. section 360bbb-3(b)(1), unless the authorization is terminated or revoked.  Performed at [facility], [address] [place], [place] [address]      Radiology Studies: No results found.   LOS: 0 days   Lanae Boast, MD [facility]  06/25/2020, 11:19 AM

## 2020-06-25 NOTE — Interval H&P Note (Signed)
History and Physical Interval Note:  06/25/2020 10:40 AM  Cameron [name]  has presented today for surgery, with the diagnosis of LT leg angio     BARD    ASO w rest pain.  The various methods of treatment have been discussed with the patient and family. After consideration of risks, benefits and other options for treatment, the patient has consented to  Procedure(s): LOWER EXTREMITY ANGIOGRAPHY (Left) as a surgical intervention.  The patient's history has been reviewed, patient examined, no change in status, stable for surgery.  I have reviewed the patient's chart and labs.  Questions were answered to the patient's satisfaction.     Festus Barren

## 2020-06-25 NOTE — Op Note (Signed)
[facility]  Percutaneous Study/Intervention Procedural Note   Date of Surgery: 06/25/2020  Surgeon(s):Jshawn Hurta    Assistants:none  Pre-operative Diagnosis: PAD with rest pain left foot  Post-operative diagnosis:  Same  Procedure(s) Performed:             1.  Ultrasound guidance for vascular access right femoral artery             2.  Catheter placement into left common femoral artery from right femoral approach             3.  Aortogram and selective left lower extremity angiogram             4.  Percutaneous transluminal angioplasty of left peroneal artery and tibioperoneal trunk with 3 mm diameter angioplasty balloon             5.   Percutaneous transluminal angioplasty of the entire left SFA and much of the popliteal artery with two 5 mm diameter Lutonix drug-coated angioplasty balloons  6.  Viabahn stent placement x3 to the left SFA and popliteal arteries with a 6 mm diameter by 25 cm length, 6 mm diameter by 15 cm length, and a 6 mm diameter by 5 cm length stent  7.  Percutaneous transluminal angioplasty of the left anterior tibial artery with 3 mm diameter angioplasty balloon  8.  Life star stent placement to the left external iliac artery with a 9 mm diameter by 8 cm length stent postdilated with a 7 mm diameter Lutonix drug-coated angioplasty balloon             9.  StarClose closure device right femoral artery  EBL: 10 cc  Contrast: 70 cc  Fluoro Time: 10.3 minutes  Moderate Conscious Sedation Time: approximately 72 minutes using 3 mg of Versed and 100 mcg of Fentanyl              Indications:  Patient is a 59 y.o.male with worsening rest pain of the left foot. The patient has noninvasive study showing very poor flow distally. The patient is brought in for angiography for further evaluation and potential treatment.  Due to the limb threatening nature of the situation, angiogram was performed for attempted limb salvage. The patient is aware that if  the procedure fails, amputation would be expected.  The patient also understands that even with successful revascularization, amputation may still be required due to the severity of the situation. Risks and benefits are discussed and informed consent is obtained.   Procedure:  The patient was identified and appropriate procedural time out was performed.  The patient was then placed supine on the table and prepped and draped in the usual sterile fashion. Moderate conscious sedation was administered during a face to face encounter with the patient throughout the procedure with my supervision of the RN administering medicines and monitoring the patient's vital signs, pulse oximetry, telemetry and mental status throughout from the start of the procedure until the patient was taken to the recovery room. Ultrasound was used to evaluate the right common femoral artery.  It was patent .  A digital ultrasound image was acquired.  A Seldinger needle was used to access the right common femoral artery under direct ultrasound guidance and a permanent image was performed.  A 0.035 J wire was advanced without resistance and a 5Fr sheath was placed.  Pigtail catheter was placed into the aorta and an AP aortogram was performed. This demonstrated Renal arteries are patent, aorta was  patent but calcified.  The right iliac system appeared to have mild disease but nothing is flow-limiting.  Left common iliac artery was calcific but not stenotic.  The left external iliac artery had about a 65 to 70% stenosis. I then crossed the aortic bifurcation and advanced to the left femoral head. Selective left lower extremity angiogram was then performed. This demonstrated fairly normal common femoral artery profunda femoris artery with a flush occlusion of the left SFA.  There was not reconstitution until a diseased mid popliteal artery.  Tibial trifurcation was also fairly diseased.  Both the anterior tibial and the peroneal arteries had  greater than 70% stenosis over the first 8 to 10 cm but were continuous distally beyond this.  The posterior tibial artery did not have any clear focal stenosis and appeared to have good flow.  Of note, the image quality was exceptionally poor as the patient never stopped moving throughout the entire procedure despite appropriate sedation. It was felt that it was in the patient's best interest to proceed with intervention after these images to avoid a second procedure and a larger amount of contrast and fluoroscopy based off of the findings from the initial angiogram. The patient was systemically heparinized and a 6 [place] Ansell sheath was then placed over the [employer] wire. I then used a Kumpe catheter and the advantage wire to navigate into and across the SFA occlusion and confirm intraluminal flow in the below-knee popliteal artery.  This helped opacify the tibial vessels better.  I then placed a V 18 wire down the peroneal artery and started with a 3 mm diameter angioplasty balloon to treat the peroneal artery as this was inflated to 10 atm for 1 minute in the tibioperoneal trunk and peroneal artery.  This was then used to predilate the tight SFA and popliteal lesions.  I then treated the left SFA and popliteal with a 5 mm diameter by 30 cm length and a 5 mm diameter by 15 cm length Lutonix drug-coated angioplasty balloon with each inflation being 10 to 12 atm for 1 minute.  The SFA and popliteal artery remained diffusely disease after angioplasty and required stent placement.  The peroneal artery had about a 30% residual stenosis.  A total of 3 Viabahn stents were required to treat the SFA and popliteal lesions.  The first 6 mm in diameter by 25 cm in length and was in the above-knee popliteal artery and mid to distal SFA.  A 6 mm diameter by 15 cm length Viabahn stent was then deployed up to the origin of the SFA.  An additional 6 mm diameter by 5 cm length stent was required distally to get the end  of the lesion in the popliteal artery.  These were postdilated with 6 mm balloons with excellent angiographic completion result and less than 10% residual stenosis.  The runoff distally still could be improved and so I elected to treat the anterior tibial artery.  Using the Kumpe catheter and the V 18 wire across the lesion in the anterior tibial artery without difficulty.  This was then addressed with a 3 mm diameter angioplasty balloon inflated to 10 atm for 1 minute.  Completion imaging showed only about a 10% residual stenosis in the anterior tibial artery.  I then turned my attention to the iliac lesion.  We replaced a 0.035 wire.  I stented the left external iliac artery with a 9 mm diameter by 8 cm length life star stent postdilated with a 7  mm balloon which was a Lutonix drug-coated balloon.  Completion imaging showed less than 10% residual stenosis in the left external iliac artery.  Again, image quality was extremely poor throughout the entire procedure due to continuous patient motion. I elected to terminate the procedure. The sheath was removed and StarClose closure device was deployed in the right femoral artery with excellent hemostatic result. The patient was taken to the recovery room in stable condition having tolerated the procedure well.  Findings:               Aortogram:  Renal arteries are patent, aorta was patent but calcified.  The right iliac system appeared to have mild disease but nothing is flow-limiting.  Left common iliac artery was calcific but not stenotic.  The left external iliac artery had about a 65 to 70% stenosis.             Left lower Extremity:  This demonstrated fairly normal common femoral artery profunda femoris artery with a flush occlusion of the left SFA.  There was not reconstitution until a diseased mid popliteal artery.  Tibial trifurcation was also fairly diseased.  Both the anterior tibial and the peroneal arteries had greater than 70% stenosis over the first 8  to 10 cm but were continuous distally beyond this.  The posterior tibial artery did not have any clear focal stenosis and appeared to have good flow.  Of note, the image quality was exceptionally poor as the patient never stopped moving throughout the entire procedure despite appropriate sedation   Disposition: Patient was taken to the recovery room in stable condition having tolerated the procedure well.  Complications: None  Leotis Pain 06/25/2020 1:04 PM   This note was created with Dragon Medical transcription system. Any errors in dictation are purely unintentional.

## 2020-06-25 NOTE — Progress Notes (Signed)
Dr. Wyn Quaker at bedside, speaking to pt. Re: PTA/stents of left leg. Pt. Nodded understanding of conversation.

## 2020-06-26 ENCOUNTER — Encounter: Payer: Self-pay | Admitting: Vascular Surgery

## 2020-06-26 ENCOUNTER — Inpatient Hospital Stay: Payer: Commercial Managed Care - PPO

## 2020-06-26 LAB — BASIC METABOLIC PANEL
Anion gap: 12 (ref 5–15)
BUN: 25 mg/dL — ABNORMAL HIGH (ref 6–20)
CO2: 27 mmol/L (ref 22–32)
Calcium: 8.7 mg/dL — ABNORMAL LOW (ref 8.9–10.3)
Chloride: 99 mmol/L (ref 98–111)
Creatinine, Ser: 1.22 mg/dL (ref 0.61–1.24)
GFR, Estimated: >60 mL/min (ref >60)
Glucose, Bld: 76 mg/dL (ref 70–99)
Potassium: 4.4 mmol/L (ref 3.5–5.1)
Sodium: 138 mmol/L (ref 135–145)

## 2020-06-26 LAB — CBC
HCT: 30.9 % — ABNORMAL LOW (ref 39.0–52.0)
Hemoglobin: 9.9 g/dL — ABNORMAL LOW (ref 13.0–17.0)
MCH: 40.4 pg — ABNORMAL HIGH (ref 26.0–34.0)
MCHC: 32 g/dL (ref 30.0–36.0)
MCV: 126.1 fL — ABNORMAL HIGH (ref 80.0–100.0)
Platelets: 178 10*3/uL (ref 150–400)
RBC: 2.45 MIL/uL — ABNORMAL LOW (ref 4.22–5.81)
RDW: 18.3 % — ABNORMAL HIGH (ref 11.5–15.5)
WBC: 14.9 10*3/uL — ABNORMAL HIGH (ref 4.0–10.5)
nRBC: 0.3 % — ABNORMAL HIGH (ref 0.0–0.2)

## 2020-06-26 MED ORDER — POLYETHYLENE GLYCOL 3350 17 G PO PACK
17.0000 g | PACK | Freq: Every day | ORAL | Status: DC
  Administered 2020-06-26 – 2020-06-27 (×2): 17 g via ORAL
  Filled 2020-06-26 (×4): qty 1

## 2020-06-26 MED ORDER — DOCUSATE SODIUM 100 MG PO CAPS: 100.0000 mg | ORAL_CAPSULE | Freq: Every day | ORAL | Status: DC | PRN

## 2020-06-26 MED ORDER — FUROSEMIDE 10 MG/ML IJ SOLN
20.0000 mg | Freq: Once | INTRAMUSCULAR | Status: AC
  Administered 2020-06-26: 12:00:00 20 mg via INTRAVENOUS
  Filled 2020-06-26: qty 2

## 2020-06-26 NOTE — Progress Notes (Signed)
PROGRESS NOTE    Cameron [name]  KCL:[id number] DOB: [birth year] DOA: 06/23/2020 PCP: Patient, No Pcp Per   Chief Complaint  Patient presents with  . PAD  Brief Narrative: 59yo Male with chronic systolic CHF lvef 20-25%, coronary artery disease, history of ST elevation MI, PVD, hyperlipidemia, hypertension presented to the ED due to worsening numbness tingling and burning sensation and pain in the left lower extremity. Patient reports difficulty bear weight and difficultyto  ambulate at home.  He has had this pain for months and had worsened on the day of admission.  He is followed by Dr. Festus Barren from vascular and is scheduled to undergo angiography of left lower extremity 12/20. he was seen in the ED and on routine work-up showed significant hypokalemia and patient was admitted due to that. In the ED hypokalemia and renal failure with creatinine 1.5, 4 months ago around 1.1. Patient was admitted, potassium was aggressively repleted received gentle IV fluids. His creatinine stabilized.  Patient complained of ongoing left lower extremity pain.  He will be scheduled to undergo angiography so seen by Dr. Wyn Quaker and is status post angio/stent 12/20  Subjective: He had a procedure on the left leg yesterday Reports his left leg is still hard full reports he feels bloated and also needing oxygen-he is requesting for his Lasix He was able to lay flat on the bed.  Assessment & Plan:  Hypokalemia/Hypomagnesemia: resolved.  Likely from his diuretics hopefully we can resume diuretics today.   AKI,baseline creat 1.1 abt 4 months ago:on admission 1.5.  On Lasix and ACE inhibitor at home and has been held.  Received gentle IV fluids.  Creatinine improved 1.2 status post angiography  12/20 and renal function remains stable will resume lasix-give 20 IV x1 reassess for a.m given slightly fluid overload state. Recent Labs  Lab 06/23/20 1652 06/24/20 0352 06/24/20 1355 06/25/20 0300 06/26/20 0606  BUN  23* 20 20 22* 25*  CREATININE 1.56* 1.37* 1.17 1.25* 1.22   PVD with Left lower extremity pain ongoing for months, interfering with his mobility:Patient was scheduled for angiography 06/25/2020 by Dr Wyn Quaker as Outpatient and he is s/p-aortogram, selective left lower extremity angiogram, PTA of left peroneal artery and tibioperoneal trunk with balloon angioplasty, PTA of the entire left SFA and moats of the popliteal artery with drug-coated angioplasty balloon, stent placement to the left SFA and popliteal arteries, PTA of the left anterior tibial artery with balloon, stent placement of the left external iliac artery.  Postop his Eliquis is resumed.  Also on high intensity Lipitor. Encourage PT OT.  Mild leukocytosis ongoing for several months ranging 11 to 13. Post op trendeding up 14k, but no fever  Anemia likely from chronic disease.  Hemoglobin 9.9 g this morning.  Monitor while on Eliquis closely as outpatient.     Chronic systolic CHF EF 20 to 25% on his last echo on August 13,2021,also with moderate elevated pulmonary artery systolic pressure.Lasix, lisinopril on hold due to hypokalemia and renal failure.  Continue Coreg at lower dose due to soft blood pressure 12/20.  Patient needing oxygen, will give Lasix iv x1 check BNP- suspect mild exacerbation of CHF, also eeding oxygen.Resume ACEI if Bp/creatinine stable tomorrow.  Essential hypertension:BP has been soft, alert 86/65 on 12/20 6:42 PM.  This morning 90s to 160s Coreg was decreased to 6.25. acei on hold, monitor.   Hyperlipidemia: cont Lipitor CAD no chest pain- cont his eliquis, coreg, statins  Nutrition: Diet Order  Diet Heart Room service appropriate? Yes; Fluid consistency: Thin  Diet effective now                 Body mass index is 28.66 kg/m.  DVT prophylaxis: eliquis Code Status:   Code Status: Full Code  Family Communication: plan of care discussed with patient at bedside.  Status is: admitted as  Observation continues to remain hospitalized  For ongoing LLE pain unable to ambualte, and needing further intervention angiography   Dispo: The patient is from: Home              Anticipated d/c is to: PT OT evaluation today and has advised skilled nursing facility               Anticipated d/c date is: 1-2 days, once okay w/ vascular and able to ambulate and off o2.              Patient currently is not medically stable to d/c. Consultants:see note  Procedures:see note  Culture/Microbiology    Component Value Date/Time   SDES BLOOD BLOOD LEFT FOREARM 02/17/2020 0901   SDES BLOOD BLOOD RIGHT FOREARM 02/17/2020 0901   SPECREQUEST  02/17/2020 0901    BOTTLES DRAWN AEROBIC AND ANAEROBIC Blood Culture adequate volume   SPECREQUEST  02/17/2020 0901    BOTTLES DRAWN AEROBIC AND ANAEROBIC Blood Culture adequate volume   CULT  02/17/2020 0901    NO GROWTH 5 DAYS Performed at [facility], [address] [place] [place], [place] [address]    CULT  02/17/2020 0901    NO GROWTH 5 DAYS Performed at [facility], [address] [place], [place] [address]    REPTSTATUS 02/22/2020 FINAL 02/17/2020 0901   REPTSTATUS 02/22/2020 FINAL 02/17/2020 0901    Other culture-see note  Medications: Scheduled Meds: . apixaban  5 mg Oral BID  . atorvastatin  80 mg Oral Daily  . carvedilol  6.25 mg Oral BID WC  . colchicine  0.6 mg Oral Daily  . ferrous sulfate  325 mg Oral BID WC  . folic acid  1 mg Oral Daily  . vitamin B-12  1,000 mcg Oral Daily   Continuous Infusions:   Antimicrobials: Anti-infectives (From admission, onward)   Start     Dose/Rate Route Frequency Ordered Stop   06/25/20 1015  ceFAZolin (ANCEF) IVPB 2g/100 mL premix       Note to Pharmacy: To be given in specials   2 g 200 mL/hr over 30 Minutes Intravenous  Once 06/25/20 1009 06/25/20 2132     Objective: Vitals: Today's Vitals   06/25/20 2333 06/25/20 2333 06/26/20 0525 06/26/20 0713  BP: (!) 125/93 (!)  125/93 116/84   Pulse: 97 87 99   Resp: 16 16 17    Temp: 97.6 F (36.4 C) 97.6 F (36.4 C) 98.7 F (37.1 C)   TempSrc: Oral Oral Oral   SpO2: 100% 100% 100%   Weight:      Height:      PainSc:    0-No pain    Intake/Output Summary (Last 24 hours) at 06/26/2020 0733 Last data filed at 06/25/2020 2130 Gross per 24 hour  Intake 60 ml  Output 570 ml  Net -510 ml   Filed Weights   06/23/20 1651 06/25/20 1034  Weight: 82.6 kg 83 kg   Weight change:   Intake/Output from previous day: 12/20 0701 - 12/21 0700 In: 60 [P.O.:60] Out: 570 [Urine:570] Intake/Output this shift: No intake/output data recorded.  Examination:  General exam: AAO3 , NAD, weak appearing. HEENT:Oral mucosa moist, Ear/Nose WNL grossly, dentition normal. Respiratory system: bilaterally diminished at base,no use of accessory muscle Cardiovascular system: S1 & S2 +, No JVD,. Gastrointestinal system: Abdomen soft, NT,ND, BS+ Nervous System:Alert, awake, moving extremities and grossly nonfocal Extremities: Bilateral leg edema +, distal peripheral pulses palpable left foot appears slightly red and tender angio site intact.  Skin: No rashes,no icterus. MSK: Normal muscle bulk,tone, power   Data Reviewed: I have personally reviewed following labs and imaging studies CBC: Recent Labs  Lab 06/23/20 1652 06/24/20 0352 06/25/20 0300 06/26/20 0606  WBC 11.7* 12.3* 9.4 14.9*  HGB 10.5* 10.7* 9.6* 9.9*  HCT 31.1* 33.0* 29.2* 30.9*  MCV 121.0* 124.1* 124.3* 126.1*  PLT 208 211 178 178   Basic Metabolic Panel: Recent Labs  Lab 06/23/20 1652 06/23/20 1830 06/24/20 0352 06/24/20 1355 06/25/20 0300 06/26/20 0606  NA 139  --  140 138 139 138  K 2.2* 2.4* 3.1* 3.4* 4.5 4.4  CL 96*  --  96* 96* 97* 99  CO2 33*  --  32 31 32 27  GLUCOSE 123*  --  124* 122* 92 76  BUN 23*  --  20 20 22* 25*  CREATININE 1.56*  --  1.37* 1.17 1.25* 1.22  CALCIUM 8.3*  --  8.5* 8.4* 8.5* 8.7*  MG  --  1.5* 2.2  --   --   --    PHOS  --  3.1  --   --   --   --    GFR: Estimated Creatinine Clearance: 68.1 mL/min (by C-G formula based on SCr of 1.22 mg/dL). Liver Function Tests: Recent Labs  Lab 06/24/20 0352  AST 15  ALT 7  ALKPHOS 74  BILITOT 0.8  PROT 7.1  ALBUMIN 3.2*   No results for input(s): LIPASE, AMYLASE in the last 168 hours. No results for input(s): AMMONIA in the last 168 hours. Coagulation Profile: No results for input(s): INR, PROTIME in the last 168 hours. Cardiac Enzymes: No results for input(s): CKTOTAL, CKMB, CKMBINDEX, TROPONINI in the last 168 hours. BNP (last 3 results) No results for input(s): PROBNP in the last 8760 hours. HbA1C: No results for input(s): HGBA1C in the last 72 hours. CBG: No results for input(s): GLUCAP in the last 168 hours. Lipid Profile: No results for input(s): CHOL, HDL, LDLCALC, TRIG, CHOLHDL, LDLDIRECT in the last 72 hours. Thyroid Function Tests: No results for input(s): TSH, T4TOTAL, FREET4, T3FREE, THYROIDAB in the last 72 hours. Anemia Panel: Recent Labs    06/23/20 2153  VITAMINB12 168*  FOLATE 4.7*  FERRITIN 249  TIBC 263  IRON 44*  RETICCTPCT 4.2*   Sepsis Labs: No results for input(s): PROCALCITON, LATICACIDVEN in the last 168 hours.  Recent Results (from the past 240 hour(s))  SARS CORONAVIRUS 2 (TAT 6-24 HRS) Nasopharyngeal Nasopharyngeal Swab     Status: None   Collection Time: 06/21/20 11:56 AM   Specimen: Nasopharyngeal Swab  Result Value Ref Range Status   SARS Coronavirus 2 NEGATIVE NEGATIVE Final    Comment: (NOTE) SARS-CoV-2 target nucleic acids are NOT DETECTED.  The SARS-CoV-2 RNA is generally detectable in upper and lower respiratory specimens during the acute phase of infection. Negative results do not preclude SARS-CoV-2 infection, do not rule out co-infections with other pathogens, and should not be used as the sole basis for treatment or other patient management decisions. Negative results must be combined with  clinical observations, patient history, and epidemiological information. The  expected result is Negative.  Fact Sheet for Patients: HairSlick.no  Fact Sheet for Healthcare Providers: quierodirigir.com  This test is not yet approved or cleared by the [place] FDA and  has been authorized for detection and/or diagnosis of SARS-CoV-2 by FDA under an Emergency Use Authorization (EUA). This EUA will remain  in effect (meaning this test can be used) for the duration of the COVID-19 declaration under Se ction 564(b)(1) of the Act, 21 U.S.C. section 360bbb-3(b)(1), unless the authorization is terminated or revoked sooner.  Performed at [facility], [address], [place], [place] [address]   Resp Panel by RT-PCR (Flu A&B, Covid) Nasopharyngeal Swab     Status: None   Collection Time: 06/24/20  5:44 AM   Specimen: Nasopharyngeal Swab; Nasopharyngeal(NP) swabs in vial transport medium  Result Value Ref Range Status   SARS Coronavirus 2 by RT PCR NEGATIVE NEGATIVE Final    Comment: (NOTE) SARS-CoV-2 target nucleic acids are NOT DETECTED.  The SARS-CoV-2 RNA is generally detectable in upper respiratory specimens during the acute phase of infection. The lowest concentration of SARS-CoV-2 viral copies this assay can detect is 138 copies/mL. A negative result does not preclude SARS-Cov-2 infection and should not be used as the sole basis for treatment or other patient management decisions. A negative result may occur with  improper specimen collection/handling, submission of specimen other than nasopharyngeal swab, presence of viral mutation(s) within the areas targeted by this assay, and inadequate number of viral copies(<138 copies/mL). A negative result must be combined with clinical observations, patient history, and epidemiological information. The expected result is Negative.  Fact Sheet for Patients:   BloggerCourse.com  Fact Sheet for Healthcare Providers:  SeriousBroker.it  This test is no t yet approved or cleared by the [place] FDA and  has been authorized for detection and/or diagnosis of SARS-CoV-2 by FDA under an Emergency Use Authorization (EUA). This EUA will remain  in effect (meaning this test can be used) for the duration of the COVID-19 declaration under Section 564(b)(1) of the Act, 21 U.S.C.section 360bbb-3(b)(1), unless the authorization is terminated  or revoked sooner.       Influenza A by PCR NEGATIVE NEGATIVE Final   Influenza B by PCR NEGATIVE NEGATIVE Final    Comment: (NOTE) The Xpert Xpress SARS-CoV-2/FLU/RSV plus assay is intended as an aid in the diagnosis of influenza from Nasopharyngeal swab specimens and should not be used as a sole basis for treatment. Nasal washings and aspirates are unacceptable for Xpert Xpress SARS-CoV-2/FLU/RSV testing.  Fact Sheet for Patients: BloggerCourse.com  Fact Sheet for Healthcare Providers: SeriousBroker.it  This test is not yet approved or cleared by the [place] FDA and has been authorized for detection and/or diagnosis of SARS-CoV-2 by FDA under an Emergency Use Authorization (EUA). This EUA will remain in effect (meaning this test can be used) for the duration of the COVID-19 declaration under Section 564(b)(1) of the Act, 21 U.S.C. section 360bbb-3(b)(1), unless the authorization is terminated or revoked.  Performed at [facility], [address], [place], [place] [address]      Radiology Studies: PERIPHERAL VASCULAR CATHETERIZATION  Result Date: 06/25/2020 See op note    LOS: 1 day   Lanae Boast, MD [facility]  06/26/2020, 7:33 AM

## 2020-06-26 NOTE — Progress Notes (Signed)
Physical Therapy Treatment Patient Details Name: Cameron [name] MRN: [id number] DOB: [birth year] Today's Date: 06/26/2020    History of Present Illness 59 yo male with onset of numbness and cold feeling with burning pain on L foot and ankle came to ED after 1.5 weeks.  Has been unable to walk without RW, unable to work as Merchandiser, retail in [employer], civil (consulting).  Noted low K+, AKI, PMHx:  PAD, CAD, anemia, CHF, STEMI, HLD, HTN,    PT Comments    Pt ready for session.  Agrees to mobility.  While I left room to get linens for chair, pt transitioned to EOB and was sitting without difficulty.  He is able to stand with min a x 1 but continues to be very protective of LLE not accepting much if any weight on leg.  Encouraged weight shifting left/right.  He is able to pivot to chair at bedside with walker and min a x 1 but does not take any true steps.  Elects to keep LE's down as putting htem up in recliner is too painful.    Discussed discharge plan.  Pt lives alone with 4 steps to enter home.  He has occasional assist but mostly alone.  Pt stated he does not feel comfortable at this time going home and is aware he will struggle.  Agree with his assessment given his inability to walk and min assist for transfers.  He is unable to accept weight for stairs at this time.  SNF is appropriate given limitations.  If he does not qualify for rehab, a wheelchair would be necessary for mobility in the home as he recovers. He is in agreement with SNF to recover to return home.     Patient suffers from PAD which impairs his/her ability to perform daily activities like toileting, feeding, dressing, grooming, bathing in the home. A cane, walker, crutch will not resolve the patient's issue with performing activities of daily living. A lightweight wheelchair and cushion is required/recommended and will allow patient to safely perform daily activities.   Patient can safely propel the wheelchair in the home or has a caregiver who  can provide assistance.     Follow Up Recommendations  SNF;Other (comment)     Equipment Recommendations  Wheelchair (measurements PT);Wheelchair cushion (measurements PT);Rolling walker with 5" wheels (will reassess after procedure)    Recommendations for Other Services       Precautions / Restrictions Precautions Precautions: Fall Restrictions Weight Bearing Restrictions: No Other Position/Activity Restrictions: tolerates llimited WB on LLE    Mobility  Bed Mobility Overal bed mobility: Modified Independent                Transfers Overall transfer level: Needs assistance Equipment used: Rolling walker (2 wheeled) Transfers: Sit to/from Stand Sit to Stand: Min guard;Min assist            Ambulation/Gait Ambulation/Gait assistance: Min Chemical engineer (Feet): 2 Feet Assistive device: Rolling walker (2 wheeled) Gait Pattern/deviations: Step-to pattern Gait velocity: decreased   General Gait Details: able to transfer to recliner but unable to accept any true weight on LLE   Stairs             Wheelchair Mobility    Modified Rankin (Stroke Patients Only)       Balance Overall balance assessment: Needs assistance Sitting-balance support: Feet supported Sitting balance-Leahy Scale: Good     Standing balance support: Bilateral upper extremity supported;During functional activity Standing balance-Leahy Scale: Fair  Cognition Arousal/Alertness: Awake/alert Behavior During Therapy: WFL for tasks assessed/performed Overall Cognitive Status: Within Functional Limits for tasks assessed                                        Exercises      General Comments        Pertinent Vitals/Pain Pain Assessment: Faces Faces Pain Scale: Hurts little more Pain Location: L foot dorsum and ankle Pain Descriptors / Indicators: Burning;Guarding Pain Intervention(s): Limited activity within  patient's tolerance;Monitored during session;Repositioned    Home Living                      Prior Function            PT Goals (current goals can now be found in the care plan section) Progress towards PT goals: Progressing toward goals    Frequency    Min 2X/week      PT Plan Discharge plan needs to be updated    Co-evaluation              AM-PAC PT "6 Clicks" Mobility   Outcome Measure  Help needed turning from your back to your side while in a flat bed without using bedrails?: None Help needed moving from lying on your back to sitting on the side of a flat bed without using bedrails?: A Little Help needed moving to and from a bed to a chair (including a wheelchair)?: A Little Help needed standing up from a chair using your arms (e.g., wheelchair or bedside chair)?: A Little Help needed to walk in hospital room?: A Little Help needed climbing 3-5 steps with a railing? : A Lot 6 Click Score: 18    End of Session Equipment Utilized During Treatment: Gait belt;Oxygen Activity Tolerance: Patient limited by pain Patient left: in bed;with call bell/phone within reach Nurse Communication: Mobility status PT Visit Diagnosis: Unsteadiness on feet (R26.81);Muscle weakness (generalized) (M62.81);Pain Pain - Right/Left: Left Pain - part of body: Ankle and joints of foot     Time: 1497-0263 PT Time Calculation (min) (ACUTE ONLY): 17 min  Charges:  $Therapeutic Activity: 8-22 mins                    Danielle Dess, PTA 06/26/20, 10:39 AM

## 2020-06-26 NOTE — NC FL2 (Signed)
[place] MEDICAID FL2 LEVEL OF CARE SCREENING TOOL     IDENTIFICATION  Patient Name: Cameron [name] Birthdate: 16-Aug-1960 Sex: male Admission Date (Current Location): 06/23/2020  [place] and [place] Number:  Chiropodist and Address:  [facility], [address] [place], [place] [address]      Provider Number: [id number]  Attending Physician Name and Address:  Lanae Boast, MD  Relative Name and Phone Number:  [place], [place])   [phone] [facility] Phone)    Current Level of Care: Hospital Recommended Level of Care: Skilled Nursing Facility Prior Approval Number:    Date Approved/Denied:   PASRR Number: [id number]  Discharge Plan: SNF    Current Diagnoses: Patient Active Problem List   Diagnosis Date Noted  . PAD (peripheral artery disease) (HCC)   . AKI (acute kidney injury) (HCC)   . Macrocytic anemia   . Atherosclerosis of native arteries of extremity with rest pain (HCC) 06/19/2020  . Acute on chronic systolic CHF (congestive heart failure) (HCC) 02/17/2020  . Hypokalemia 02/17/2020  . Hypomagnesemia 02/17/2020  . Elevated troponin 02/17/2020  . New onset atrial fibrillation (HCC) 02/17/2020  . Olecranon bursitis of left elbow 02/17/2020  . NSVT (nonsustained ventricular tachycardia) (HCC) 02/17/2020  . Tobacco abuse 02/17/2020  . Apical mural thrombus 02/17/2020  . Gout_left elbow 02/17/2020  . Bilateral leg edema 10/19/2018  . Weight gain 10/19/2018  . Chest pain 02/02/2018  . Acute pain of right knee 03/13/2017  . Essential hypertension 06/20/2016  . Hyperlipidemia 06/20/2016  . CAD (coronary artery disease), native coronary artery 06/20/2016  . STEMI involving oth coronary artery of anterior wall (HCC) 06/17/2016  . STEMI (ST elevation myocardial infarction) (HCC) 06/17/2016    Orientation RESPIRATION BLADDER Height & Weight     Time,Place,Situation,Self  O2 Continent Weight: 183 lb (83 kg) Height:  5\' 7"   (170.2 cm)  BEHAVIORAL SYMPTOMS/MOOD NEUROLOGICAL BOWEL NUTRITION STATUS      Continent Diet (heart diet)  AMBULATORY STATUS COMMUNICATION OF NEEDS Skin   Limited Assist Verbally  (cellulitis L foot)                       Personal Care Assistance Level of Assistance  Bathing,Feeding,Dressing Bathing Assistance: Maximum assistance Feeding assistance: Limited assistance Dressing Assistance: Maximum assistance     Functional Limitations Info             SPECIAL CARE FACTORS FREQUENCY  PT (By licensed PT),OT (By licensed OT)     PT Frequency: 5 x/week OT Frequency: 5 x/week            Contractures      Additional Factors Info  Code Status,Allergies Code Status Info: full Allergies Info: nka           Current Medications (06/26/2020):  This is the current hospital active medication list Current Facility-Administered Medications  Medication Dose Route Frequency Provider Last Rate Last Admin  . acetaminophen (TYLENOL) tablet 650 mg  650 mg Oral Q6H PRN 06/28/2020, MD       Or  . acetaminophen (TYLENOL) suppository 650 mg  650 mg Rectal Q6H PRN Annice Needy, MD      . albuterol (VENTOLIN HFA) 108 (90 Base) MCG/ACT inhaler 2 puff  2 puff Inhalation Q6H PRN Annice Needy, MD   2 puff at 06/25/20 1235  . apixaban (ELIQUIS) tablet 5 mg  5 mg Oral BID 06/27/20, MD   5 mg at  06/26/20 1139  . atorvastatin (LIPITOR) tablet 80 mg  80 mg Oral Daily Annice Needy, MD   80 mg at 06/26/20 1138  . carvedilol (COREG) tablet 6.25 mg  6.25 mg Oral BID WC Annice Needy, MD   6.25 mg at 06/26/20 1139  . colchicine tablet 0.6 mg  0.6 mg Oral Daily Annice Needy, MD   0.6 mg at 06/26/20 1139  . ferrous sulfate tablet 325 mg  325 mg Oral BID WC Annice Needy, MD   325 mg at 06/26/20 1137  . folic acid (FOLVITE) tablet 1 mg  1 mg Oral Daily Annice Needy, MD   1 mg at 06/26/20 1138  . HYDROcodone-acetaminophen (NORCO/VICODIN) 5-325 MG per tablet 1 tablet  1 tablet Oral Q4H PRN Annice Needy, MD   1 tablet at 06/26/20 1137  . morphine 2 MG/ML injection 2 mg  2 mg Intravenous Q2H PRN Annice Needy, MD      . morphine 2 MG/ML injection 2 mg  2 mg Intravenous Q2H PRN Annice Needy, MD   2 mg at 06/26/20 [phone]  . nitroGLYCERIN (NITROSTAT) SL tablet 0.4 mg  0.4 mg Sublingual Q5 min PRN Annice Needy, MD      . ondansetron [facility]) injection 4 mg  4 mg Intravenous Q6H PRN Annice Needy, MD      . prochlorperazine (COMPAZINE) injection 5 mg  5 mg Intravenous Q4H PRN Annice Needy, MD      . vitamin B-12 (CYANOCOBALAMIN) tablet 1,000 mcg  1,000 mcg Oral Daily Annice Needy, MD   1,000 mcg at 06/26/20 1137     Discharge Medications: Please see discharge summary for a list of discharge medications.  Relevant Imaging Results:  Relevant Lab Results:   Additional Information SS #: [id number]  Shareka Casale E Tobe Kervin, LCSW

## 2020-06-26 NOTE — TOC Initial Note (Addendum)
Transition of Care [facility]) - Initial/Assessment Note    Patient Details  Name: [name] MRN: [id number] Date of Birth: 1961/07/02  Transition [facility]) CM/SW Contact:    Cameron Ivan, LCSW Phone Number: 06/26/2020, 11:43 AM  Clinical Narrative:    CSW met with patient at bedside. Patient reported he lives alone and drives himself to appointments. PCP is [name] [name]. [facility] [place]. Patient has a RW and crutches at home. No HH or SNF history. Discussed PT recommending HHPT and patient was agreeable and denied having an agency preference. CSW was then informed by PTA that recommendation is changed to SNF. Spoke with patient again at bedside and patient is agreeable to SNF rehab at discharge. Says his top choice would be [name]. CSW is starting SNF work up.  Patient reported he is not vaccinated for COVID.  CSW asked [name] with [name] to review referral since that is patient's top choice. Cameron [name] reported she will check on the referral to see if they can take patient.             Expected Discharge Plan: Skilled Nursing Facility Barriers to Discharge: Continued Medical Work up   Patient Goals and CMS Choice Patient states their goals for this hospitalization and ongoing recovery are:: SNF rehab before returning home CMS Medicare.gov Compare Post Acute Care list provided to:: Patient Choice offered to / list presented to : Patient  Expected Discharge Plan and Services Expected Discharge Plan: [name]       Living arrangements for the past 2 months: Single Family Home                                      Prior Living Arrangements/Services Living arrangements for the past 2 months: Single Family Home Lives with:: Self Patient language and need for interpreter reviewed:: Yes Do you feel safe going back to the place where you live?: Yes      Need for Family Participation in Patient Care: Yes (Comment)    Current home services: DME Criminal Activity/Legal Involvement Pertinent to Current Situation/Hospitalization: No - Comment as needed  Activities of Daily Living Home Assistive Devices/Equipment: Crutches,Cane (specify quad or straight),Walker (specify type) ADL Screening (condition at time of admission) Patient's cognitive ability adequate to safely complete daily activities?: Yes Is the patient deaf or have difficulty hearing?: No Does the patient have difficulty seeing, even when wearing glasses/contacts?: No Does the patient have difficulty concentrating, remembering, or making decisions?: No Patient able to express need for assistance with ADLs?: Yes Does the patient have difficulty dressing or bathing?: No Independently performs ADLs?: Yes (appropriate for developmental age) Does the patient have difficulty walking or climbing stairs?: Yes Weakness of Legs: Left Weakness of Arms/Hands: Both  Permission Sought/Granted Permission sought to share information with : Chartered certified accountant granted to share information with : Yes, Verbal Permission Granted     Permission granted to share info w AGENCY: SNFs, HH, DME agencies as needed        Emotional Assessment       Orientation: : Oriented to Self,Oriented to Place,Oriented to  Time,Oriented to Situation Alcohol / Substance Use: Not Applicable Psych Involvement: No (comment)  Admission diagnosis:  Hypokalemia [E87.6] PVD (peripheral vascular disease) ([place]) [I73.9] Patient Active Problem List   Diagnosis Date Noted  . PAD (peripheral artery disease) (  [place])   . AKI (acute kidney injury) ([place])   . Macrocytic anemia   . Atherosclerosis of native arteries of extremity with rest pain ([place]) 06/19/2020  . Acute on chronic systolic CHF (congestive heart failure) ([place]) 02/17/2020  . Hypokalemia 02/17/2020  . Hypomagnesemia 02/17/2020  . Elevated troponin 02/17/2020  . New onset atrial fibrillation ([place])  02/17/2020  . Olecranon bursitis of left elbow 02/17/2020  . NSVT (nonsustained ventricular tachycardia) ([place]) 02/17/2020  . Tobacco abuse 02/17/2020  . Apical mural thrombus 02/17/2020  . Gout_left elbow 02/17/2020  . Bilateral leg edema 10/19/2018  . Weight gain 10/19/2018  . Chest pain 02/02/2018  . Acute pain of right knee 03/13/2017  . Essential hypertension 06/20/2016  . Hyperlipidemia 06/20/2016  . CAD (coronary artery disease), native coronary artery 06/20/2016  . STEMI involving oth coronary artery of anterior wall ([place]) 06/17/2016  . STEMI (ST elevation myocardial infarction) ([place]) 06/17/2016   PCP:  Patient, No Pcp Per Pharmacy:   [id number] [place] [id number]: [phone]: [phone]    Social Determinants of Health (SDOH) Interventions    Readmission Risk Interventions No flowsheet data found.

## 2020-06-27 DIAGNOSIS — J9601 Acute respiratory failure with hypoxia: Secondary | ICD-10-CM

## 2020-06-27 DIAGNOSIS — I739 Peripheral vascular disease, unspecified: Secondary | ICD-10-CM

## 2020-06-27 DIAGNOSIS — J9621 Acute and chronic respiratory failure with hypoxia: Secondary | ICD-10-CM

## 2020-06-27 DIAGNOSIS — E785 Hyperlipidemia, unspecified: Secondary | ICD-10-CM

## 2020-06-27 DIAGNOSIS — I959 Hypotension, unspecified: Secondary | ICD-10-CM

## 2020-06-27 DIAGNOSIS — I5023 Acute on chronic systolic (congestive) heart failure: Secondary | ICD-10-CM

## 2020-06-27 LAB — BRAIN NATRIURETIC PEPTIDE: B Natriuretic Peptide: 3221.6 pg/mL — ABNORMAL HIGH (ref 0.0–100.0)

## 2020-06-27 LAB — BASIC METABOLIC PANEL
Anion gap: 10 (ref 5–15)
BUN: 31 mg/dL — ABNORMAL HIGH (ref 6–20)
CO2: 32 mmol/L (ref 22–32)
Calcium: 8.2 mg/dL — ABNORMAL LOW (ref 8.9–10.3)
Chloride: 98 mmol/L (ref 98–111)
Creatinine, Ser: 1.38 mg/dL — ABNORMAL HIGH (ref 0.61–1.24)
GFR, Estimated: 59 mL/min — ABNORMAL LOW (ref >60)
Glucose, Bld: 118 mg/dL — ABNORMAL HIGH (ref 70–99)
Potassium: 4.3 mmol/L (ref 3.5–5.1)
Sodium: 140 mmol/L (ref 135–145)

## 2020-06-27 LAB — CBC
HCT: 28.8 % — ABNORMAL LOW (ref 39.0–52.0)
Hemoglobin: 9.3 g/dL — ABNORMAL LOW (ref 13.0–17.0)
MCH: 40.1 pg — ABNORMAL HIGH (ref 26.0–34.0)
MCHC: 32.3 g/dL (ref 30.0–36.0)
MCV: 124.1 fL — ABNORMAL HIGH (ref 80.0–100.0)
Platelets: 169 10*3/uL (ref 150–400)
RBC: 2.32 MIL/uL — ABNORMAL LOW (ref 4.22–5.81)
RDW: 17.8 % — ABNORMAL HIGH (ref 11.5–15.5)
WBC: 12 10*3/uL — ABNORMAL HIGH (ref 4.0–10.5)
nRBC: 0.7 % — ABNORMAL HIGH (ref 0.0–0.2)

## 2020-06-27 MED ORDER — FUROSEMIDE 20 MG PO TABS
20.0000 mg | ORAL_TABLET | Freq: Two times a day (BID) | ORAL | Status: DC
  Filled 2020-06-27: qty 1

## 2020-06-27 MED ORDER — FUROSEMIDE 10 MG/ML IJ SOLN
20.0000 mg | Freq: Two times a day (BID) | INTRAMUSCULAR | Status: DC
  Administered 2020-06-27 – 2020-06-28 (×2): 20 mg via INTRAVENOUS
  Filled 2020-06-27 (×2): qty 2

## 2020-06-27 MED ORDER — CARVEDILOL 3.125 MG PO TABS
3.1250 mg | ORAL_TABLET | Freq: Two times a day (BID) | ORAL | Status: DC
  Administered 2020-06-27 – 2020-06-29 (×4): 3.125 mg via ORAL
  Filled 2020-06-27 (×5): qty 1

## 2020-06-27 NOTE — Progress Notes (Signed)
Physical Therapy Treatment Patient Details Name: [name] MRN: [id number] DOB: June 26, 1961 Today's Date: 06/27/2020    History of Present Illness 59 yo male with onset of numbness and cold feeling with burning pain on L foot and ankle came to ED after 1.5 weeks.  Has been unable to walk without RW, unable to work as Merchandiser, retail in [employer], civil (consulting).  Noted low K+, AKI, PMHx:  PAD, CAD, anemia, CHF, STEMI, HLD, HTN,    PT Comments    Pt was long sitting in bed upon arriving. He agrees to PT session and reports he was able to  Get up two times last night. Bed position was laid flat and pt was easily able to get OOB to short sit EOB. Stood to [employer] with supervision and ambulate around room 50 ft. No LOB or safety concern. Pt unwilling to leave room to trial stair training but does report having bilateral rails with short distance to walk to enter home. Pt wants to Dc home and is agreeable to [facility] services.    Follow Up Recommendations  Home health PT;Other (comment) (pt was able to perform all mobility,transfers,gait without physical assistance. pt wanting to DC to home versus to rehab. Recommend HHPT at DC.)     Equipment Recommendations  Rolling walker with 5" wheels    Recommendations for Other Services       Precautions / Restrictions Precautions Precautions: Fall Restrictions Weight Bearing Restrictions: No    Mobility  Bed Mobility Overal bed mobility: Modified Independent      General bed mobility comments: no physical assistance to exit R side of bed with bed surface flat. did not need to use bed rails.  Transfers Overall transfer level: Needs assistance Equipment used: Rolling walker (2 wheeled) Transfers: Sit to/from Stand Sit to Stand: Supervision      General transfer comment: Pt was able to stand EOB to RW with supervision. no physical assistance required.  Ambulation/Gait Ambulation/Gait assistance: Supervision Gait Distance (Feet): 50 Feet Assistive device:  Rolling walker (2 wheeled) Gait Pattern/deviations: Step-to pattern Gait velocity: decreased   General Gait Details: Pt was easily able to ambulate 50 ft with RW. reports minimal pain in LLE. no balance deficits or safety concerns during ambulation.      Balance Overall balance assessment: Needs assistance Sitting-balance support: Feet supported Sitting balance-Leahy Scale: Good Sitting balance - Comments: no LOB in sitting even reaching outside BOS   Standing balance support: Bilateral upper extremity supported;During functional activity Standing balance-Leahy Scale: Good Standing balance comment: no LOB in standing with UE support         Cognition Arousal/Alertness: Awake/alert Behavior During Therapy: WFL for tasks assessed/performed Overall Cognitive Status: Within Functional Limits for tasks assessed    General Comments: Pt was A and O. " I was able to get up twice last night."             Pertinent Vitals/Pain Pain Assessment:  (denies pain at rest, minimal 3/10 in wt bearing) Faces Pain Scale: Hurts a little bit Pain Location: L foot dorsum and ankle Pain Descriptors / Indicators: Burning;Guarding Pain Intervention(s): Limited activity within patient's tolerance;Monitored during session;Repositioned           PT Goals (current goals can now be found in the care plan section) Acute Rehab PT Goals Patient Stated Goal: To go home Progress towards PT goals: Progressing toward goals    Frequency    Min 2X/week      PT Plan Discharge plan needs  to be updated       AM-PAC PT "6 Clicks" Mobility   Outcome Measure  Help needed turning from your back to your side while in a flat bed without using bedrails?: None Help needed moving from lying on your back to sitting on the side of a flat bed without using bedrails?: None Help needed moving to and from a bed to a chair (including a wheelchair)?: None Help needed standing up from a chair using your arms  (e.g., wheelchair or bedside chair)?: None Help needed to walk in hospital room?: None Help needed climbing 3-5 steps with a railing? : A Little 6 Click Score: 23    End of Session Equipment Utilized During Treatment: Other (comment) (none) Activity Tolerance: Patient tolerated treatment well Patient left: in chair;with call bell/phone within reach;with chair alarm set Nurse Communication: Mobility status PT Visit Diagnosis: Unsteadiness on feet (R26.81);Muscle weakness (generalized) (M62.81);Pain Pain - Right/Left: Left Pain - part of body: Ankle and joints of foot     Time: 4008-6761 PT Time Calculation (min) (ACUTE ONLY): 11 min  Charges:  $Gait Training: 8-22 mins                     Jetta Lout PTA 06/27/20, 11:15 AM

## 2020-06-27 NOTE — TOC Progression Note (Addendum)
Transition of Care [facility]) - Progression Note    Patient Details  Name: [name] MRN: [id number] Date of Birth: 1960-08-14  Transition of [facility]) CM/SW Contact  Liliana Cline, LCSW Phone Number: 06/27/2020, 11:06 AM  Clinical Narrative:   CSW updated by both MD and PT that patient has improved and now the recommendation for Home Health. Per MD, patient plans to return to work on Monday so Home Health would not be appropriate, patient says he does not have time. Spoke to patient at bedside and offered Outpatient PT resources. Patient reported he does not feel he needs Home Health or Outpatient PT and confirmed that he plans to return to work on Monday. Provided Outpatient PT resource list to patient in case he changes his mind. Patient reported he has questions about his surgery, updated RN.    Expected Discharge Plan: Skilled Nursing Facility Barriers to Discharge: Continued Medical Work up  Expected Discharge Plan and Services Expected Discharge Plan: Skilled Nursing Facility       Living arrangements for the past 2 months: Single Family Home                                       Social Determinants of Health (SDOH) Interventions    Readmission Risk Interventions No flowsheet data found.

## 2020-06-27 NOTE — Progress Notes (Signed)
Patient ID: [name], male   DOB: 1961/03/10, 59 y.o.   MRN: [id number] [facility] PROGRESS NOTE  [name]:[id number] DOB: 05-17-1961 DOA: 06/23/2020 PCP: Patient, No Pcp Per  HPI/Subjective: Patient has a little foot pain.  Foot has been red for a couple weeks now.  Feels his breathing is okay.  A little swelling at the left foot.  Objective: Vitals:   06/27/20 0834 06/27/20 1214  BP: 111/79 92/77  Pulse: (!) 109 89  Resp: 16 16  Temp: 99.2 F (37.3 C) 98.6 F (37 C)  SpO2: (!) 76% (!) 86%    Filed Weights   06/23/20 1651 06/25/20 1034  Weight: 82.6 kg 83 kg    ROS: Review of Systems  Respiratory: Negative for cough and shortness of breath.   Cardiovascular: Negative for chest pain.  Gastrointestinal: Negative for abdominal pain, nausea and vomiting.  Musculoskeletal: Positive for joint pain.   Exam: Physical Exam HENT:     Head: Normocephalic.     Mouth/Throat:     Pharynx: No oropharyngeal exudate.  Eyes:     General: Lids are normal.     Conjunctiva/sclera: Conjunctivae normal.     Pupils: Pupils are equal, round, and reactive to light.  Cardiovascular:     Rate and Rhythm: Normal rate and regular rhythm.     Heart sounds: Normal heart sounds, S1 normal and S2 normal.  Pulmonary:     Breath sounds: No decreased breath sounds, wheezing, rhonchi or rales.  Abdominal:     Palpations: Abdomen is soft.     Tenderness: There is no abdominal tenderness.  Musculoskeletal:     Right lower leg: No swelling.     Left lower leg: Swelling present.  Skin:    General: Skin is warm.     Comments: Slight redness left foot.  Neurological:     Mental Status: He is alert and oriented to person, place, and time.       Data Reviewed: Basic Metabolic Panel: Recent Labs  Lab 06/23/20 1830 06/24/20 0352 06/24/20 1355 06/25/20 0300 06/26/20 0606 06/27/20 0343  NA  --  140 138 139 138 140  K 2.4* 3.1* 3.4* 4.5 4.4 4.3  CL  --  96* 96* 97*  99 98  CO2  --  32 31 32 27 32  GLUCOSE  --  124* 122* 92 76 118*  BUN  --  20 20 22* 25* 31*  CREATININE  --  1.37* 1.17 1.25* 1.22 1.38*  CALCIUM  --  8.5* 8.4* 8.5* 8.7* 8.2*  MG 1.5* 2.2  --   --   --   --   PHOS 3.1  --   --   --   --   --    Liver Function Tests: Recent Labs  Lab 06/24/20 0352  AST 15  ALT 7  ALKPHOS 74  BILITOT 0.8  PROT 7.1  ALBUMIN 3.2*   CBC: Recent Labs  Lab 06/23/20 1652 06/24/20 0352 06/25/20 0300 06/26/20 0606 06/27/20 0343  WBC 11.7* 12.3* 9.4 14.9* 12.0*  HGB 10.5* 10.7* 9.6* 9.9* 9.3*  HCT 31.1* 33.0* 29.2* 30.9* 28.8*  MCV 121.0* 124.1* 124.3* 126.1* 124.1*  PLT 208 211 178 178 169   BNP (last 3 results) Recent Labs    02/17/20 0526 06/27/20 0343  BNP 2,228.8* 3,221.6*     Recent Results (from the past 240 hour(s))  SARS CORONAVIRUS 2 (TAT 6-24 HRS) Nasopharyngeal Nasopharyngeal Swab     Status:  None   Collection Time: 06/21/20 11:56 AM   Specimen: Nasopharyngeal Swab  Result Value Ref Range Status   SARS Coronavirus 2 NEGATIVE NEGATIVE Final    Comment: (NOTE) SARS-CoV-2 target nucleic acids are NOT DETECTED.  The SARS-CoV-2 RNA is generally detectable in upper and lower respiratory specimens during the acute phase of infection. Negative results do not preclude SARS-CoV-2 infection, do not rule out co-infections with other pathogens, and should not be used as the sole basis for treatment or other patient management decisions. Negative results must be combined with clinical observations, patient history, and epidemiological information. The expected result is Negative.  Fact Sheet for Patients: HairSlick.no  Fact Sheet for Healthcare Providers: quierodirigir.com  This test is not yet approved or cleared by the [place] FDA and  has been authorized for detection and/or diagnosis of SARS-CoV-2 by FDA under an Emergency Use Authorization (EUA). This EUA will  remain  in effect (meaning this test can be used) for the duration of the COVID-19 declaration under Se ction 564(b)(1) of the Act, 21 U.S.C. section 360bbb-3(b)(1), unless the authorization is terminated or revoked sooner.  Performed at [facility], [address], [place], [place] [address]   Resp Panel by RT-PCR (Flu A&B, Covid) Nasopharyngeal Swab     Status: None   Collection Time: 06/24/20  5:44 AM   Specimen: Nasopharyngeal Swab; Nasopharyngeal(NP) swabs in vial transport medium  Result Value Ref Range Status   SARS Coronavirus 2 by RT PCR NEGATIVE NEGATIVE Final    Comment: (NOTE) SARS-CoV-2 target nucleic acids are NOT DETECTED.  The SARS-CoV-2 RNA is generally detectable in upper respiratory specimens during the acute phase of infection. The lowest concentration of SARS-CoV-2 viral copies this assay can detect is 138 copies/mL. A negative result does not preclude SARS-Cov-2 infection and should not be used as the sole basis for treatment or other patient management decisions. A negative result may occur with  improper specimen collection/handling, submission of specimen other than nasopharyngeal swab, presence of viral mutation(s) within the areas targeted by this assay, and inadequate number of viral copies(<138 copies/mL). A negative result must be combined with clinical observations, patient history, and epidemiological information. The expected result is Negative.  Fact Sheet for Patients:  BloggerCourse.com  Fact Sheet for Healthcare Providers:  SeriousBroker.it  This test is no t yet approved or cleared by the [place] FDA and  has been authorized for detection and/or diagnosis of SARS-CoV-2 by FDA under an Emergency Use Authorization (EUA). This EUA will remain  in effect (meaning this test can be used) for the duration of the COVID-19 declaration under Section 564(b)(1) of the Act, 21 U.S.C.section  360bbb-3(b)(1), unless the authorization is terminated  or revoked sooner.       Influenza A by PCR NEGATIVE NEGATIVE Final   Influenza B by PCR NEGATIVE NEGATIVE Final    Comment: (NOTE) The Xpert Xpress SARS-CoV-2/FLU/RSV plus assay is intended as an aid in the diagnosis of influenza from Nasopharyngeal swab specimens and should not be used as a sole basis for treatment. Nasal washings and aspirates are unacceptable for Xpert Xpress SARS-CoV-2/FLU/RSV testing.  Fact Sheet for Patients: BloggerCourse.com  Fact Sheet for Healthcare Providers: SeriousBroker.it  This test is not yet approved or cleared by the [place] FDA and has been authorized for detection and/or diagnosis of SARS-CoV-2 by FDA under an Emergency Use Authorization (EUA). This EUA will remain in effect (meaning this test can be used) for the duration of the  COVID-19 declaration under Section 564(b)(1) of the Act, 21 U.S.C. section 360bbb-3(b)(1), unless the authorization is terminated or revoked.  Performed at [facility], [address], [place], [place] [address]      Studies: [facility] Chest [place] 1 View  Result Date: 06/26/2020 CLINICAL DATA:  Shortness of breath. EXAM: PORTABLE CHEST 1 VIEW COMPARISON:  One-view chest x-ray 02/17/2020 FINDINGS: Heart is enlarged. Mild to moderate pulmonary vascular congestion is present. No significant edema is present. Mild atelectasis is present at the bases. IMPRESSION: Cardiomegaly and mild to moderate pulmonary vascular congestion without frank edema. Electronically Signed   By: Marin Roberts M.D.   On: 06/26/2020 13:49    Scheduled Meds: . apixaban  5 mg Oral BID  . atorvastatin  80 mg Oral Daily  . carvedilol  6.25 mg Oral BID WC  . colchicine  0.6 mg Oral Daily  . ferrous sulfate  325 mg Oral BID WC  . folic acid  1 mg Oral Daily  . furosemide  20 mg Oral BID  . polyethylene glycol  17 g Oral  Daily  . vitamin B-12  1,000 mcg Oral Daily    Assessment/Plan:  1. Peripheral vascular disease with left foot pain.  Patient status post procedure by vascular surgery on 06/25/2020.  Patient had numerous angioplasties and stents placements.  Please see operative report.  Patient on Eliquis for anticoagulation. 2. Acute systolic congestive heart failure on echocardiogram.  Given Lasix 20 mg orally daily but seeing pulse ox low this afternoon I will switch to IV 20 mg twice daily.  Decrease dose of Coreg down to 3.125 mg twice a day.  Blood pressure too low for any other medications currently. 3. Relative hypotension.  Continue to monitor closely. 4. Hyperlipidemia unspecified on high-dose Lipitor 5. History of CAD on Eliquis, Coreg and atorvastatin 6. Weakness.  Physical therapy initially recommended rehab but did better today and patient wants to go home. 7. Acute hypoxic respiratory failure with pulse ox 76% on room air.  Asking nurse to recheck that and see if this is a real number.  Will give oxygen if still low. 8. Chronic kidney disease stage IIIa as of today's labs with creatinine of 1.38.  Prior to today's labs had underlying chronic kidney disease stage II.        Code Status:     Code Status Orders  (From admission, onward)         Start     Ordered   06/23/20 2058  Full code  Continuous        06/23/20 2101        Code Status History    Date Active Date Inactive Code Status Order ID Comments User Context   02/17/2020 0801 02/21/2020 1917 Full Code [id number]  Lorretta Harp, MD ED   02/02/2018 0414 02/03/2018 2118 Full Code [id number]  Arnaldo Natal, MD Inpatient   06/17/2016 0630 06/20/2016 1840 Full Code [id number]  Runell Gess, MD Inpatient   Advance Care Planning Activity     Family Communication: Deferred Disposition Plan: Status is: Inpatient  Dispo: The patient is from: Home              Anticipated d/c is to: Home              Anticipated d/c date is:  Reevaluate tomorrow.  Keep things on a day-to-day basis on when to go home.  Patient currently have low pulse ox this afternoon and will switch Lasix back to IV this afternoon.  Continue to monitor.  Consultants:  Vascular surgery  Time spent: 28 minutes  [employer]

## 2020-06-28 DIAGNOSIS — I5021 Acute systolic (congestive) heart failure: Secondary | ICD-10-CM

## 2020-06-28 DIAGNOSIS — N182 Chronic kidney disease, stage 2 (mild): Secondary | ICD-10-CM

## 2020-06-28 LAB — BASIC METABOLIC PANEL
Anion gap: 10 (ref 5–15)
BUN: 25 mg/dL — ABNORMAL HIGH (ref 6–20)
CO2: 32 mmol/L (ref 22–32)
Calcium: 8.6 mg/dL — ABNORMAL LOW (ref 8.9–10.3)
Chloride: 96 mmol/L — ABNORMAL LOW (ref 98–111)
Creatinine, Ser: 1.14 mg/dL (ref 0.61–1.24)
GFR, Estimated: >60 mL/min (ref >60)
Glucose, Bld: 105 mg/dL — ABNORMAL HIGH (ref 70–99)
Potassium: 4.7 mmol/L (ref 3.5–5.1)
Sodium: 138 mmol/L (ref 135–145)

## 2020-06-28 LAB — URIC ACID: Uric Acid, Serum: 10.1 mg/dL — ABNORMAL HIGH (ref 3.7–8.6)

## 2020-06-28 MED ORDER — COVID-19 MRNA VACCINE (PFIZER) 30 MCG/0.3ML IM SUSP
0.3000 mL | Freq: Once | INTRAMUSCULAR | Status: DC
  Filled 2020-06-28: qty 0.3

## 2020-06-28 MED ORDER — FUROSEMIDE 10 MG/ML IJ SOLN
40.0000 mg | Freq: Two times a day (BID) | INTRAMUSCULAR | Status: DC
  Administered 2020-06-28 – 2020-06-29 (×2): 40 mg via INTRAVENOUS
  Filled 2020-06-28 (×2): qty 4

## 2020-06-28 MED ORDER — FUROSEMIDE 10 MG/ML IJ SOLN
20.0000 mg | Freq: Once | INTRAMUSCULAR | Status: AC
  Administered 2020-06-28: 10:00:00 20 mg via INTRAVENOUS
  Filled 2020-06-28: qty 2

## 2020-06-28 NOTE — TOC Progression Note (Addendum)
Transition of Care [facility]) - Progression Note    Patient Details  Name: Cameron [name] MRN: [id number] Date of Birth: 28-Jul-1960  Transition of Care [facility]) CM/SW Contact  Liliana Cline, LCSW Phone Number: 06/28/2020, 1:21 PM  Clinical Narrative:   CSW informed by MD that patient needs home o2. Spoke to patient who is agreeable to o2 being ordered. Referral made to [facility] with [employer], he is aware plan to DC tomorrow.  4:00- Asked MD for home o2 orders.    Expected Discharge Plan: Skilled Nursing Facility Barriers to Discharge: Continued Medical Work up  Expected Discharge Plan and Services Expected Discharge Plan: Skilled Nursing Facility       Living arrangements for the past 2 months: Single Family Home                                       Social Determinants of Health (SDOH) Interventions    Readmission Risk Interventions No flowsheet data found.

## 2020-06-28 NOTE — Progress Notes (Addendum)
   06/28/20 0900 06/28/20 0935 06/28/20 0936  Oxygen Therapy  SpO2  --  (!) 77 % 93 %  O2 Device Room Air Room Air Nasal Cannula  Attempted to wean pt to room air sats drop to 77% at rest reapplied 2 L [place]. notified MD wieting

## 2020-06-28 NOTE — Progress Notes (Signed)
Physical Therapy Treatment Patient Details Name: Cameron [name] MRN: [id number] DOB: 1960/11/14 Today's Date: 06/28/2020    History of Present Illness 59 yo male with onset of numbness and cold feeling with burning pain on L foot and ankle came to ED after 1.5 weeks.  Has been unable to walk without RW, unable to work as Merchandiser, retail in [employer], [employer] (consulting).  Noted low K+, AKI, PMHx:  PAD, CAD, anemia, CHF, STEMI, HLD, HTN,    PT Comments    Patient is making progress with increased independence with functional mobility. Patient is modified independent with transfers. Supervision for safety and cues for technique with ambulation using rolling walker with no loss of balance. No shortness of breath with activity, Sp02 98% on 2 L 02 after walking. Recommend to continue PT to maximize independence and address remaining functional limitations. HHPT is recommended at discharge.      Follow Up Recommendations  Home health PT     Equipment Recommendations  Rolling walker with 5" wheels    Recommendations for Other Services       Precautions / Restrictions Precautions Precautions: Fall Restrictions Weight Bearing Restrictions: No    Mobility  Bed Mobility   Bed Mobility: Supine to Sit     Supine to sit: Supervision     General bed mobility comments: head of bed flat  Transfers Overall transfer level: Needs assistance Equipment used: Rolling walker (2 wheeled) Transfers: Sit to/from Stand Sit to Stand: Modified independent (Device/Increase time)         General transfer comment: patient demonstrated good safety awareness with functional transfers  Ambulation/Gait Ambulation/Gait assistance: Supervision Gait Distance (Feet): 50 Feet Assistive device: Rolling walker (2 wheeled) Gait Pattern/deviations: Step-to pattern;Step-through pattern Gait velocity: decreased   General Gait Details: pre-gait activity performed with cues for weight acceptance on LLE prior to ambulating.  patient does report decreased sensation on left foot along with some pain. education provided on using rolling walker for safety and fall prevention. patient demonstrating safe technique with turns and cues provided as needed for technique. patient with step to pattern initially progressing to step through pattern. no shortness of breath noted with activity. Sp02 98% on 2 L02 before and after walking. patient declined walking in hallway this session   Stairs             Wheelchair Mobility    Modified Rankin (Stroke Patients Only)       Balance Overall balance assessment: Needs assistance Sitting-balance support: Feet supported Sitting balance-Leahy Scale: Good Sitting balance - Comments: no loss of balance in sitting position   Standing balance support: Bilateral upper extremity supported;During functional activity Standing balance-Leahy Scale: Good Standing balance comment: no loss of balance in standing position with unilateral UE support                            Cognition Arousal/Alertness: Awake/alert Behavior During Therapy: WFL for tasks assessed/performed Overall Cognitive Status: Within Functional Limits for tasks assessed                                        Exercises      General Comments        Pertinent Vitals/Pain Pain Assessment: Faces Faces Pain Scale: Hurts little more Pain Location: left foot Pain Descriptors / Indicators: Burning Pain Intervention(s): Limited activity within patient's tolerance  Home Living                      Prior Function            PT Goals (current goals can now be found in the care plan section) Acute Rehab PT Goals Patient Stated Goal: to go home PT Goal Formulation: With patient Time For Goal Achievement: 07/01/20 Potential to Achieve Goals: Good Progress towards PT goals: Progressing toward goals    Frequency    Min 2X/week      PT Plan Discharge plan needs  to be updated    Co-evaluation              AM-PAC PT "6 Clicks" Mobility   Outcome Measure  Help needed turning from your back to your side while in a flat bed without using bedrails?: None Help needed moving from lying on your back to sitting on the side of a flat bed without using bedrails?: None Help needed moving to and from a bed to a chair (including a wheelchair)?: None Help needed standing up from a chair using your arms (e.g., wheelchair or bedside chair)?: None Help needed to walk in hospital room?: None Help needed climbing 3-5 steps with a railing? : A Little 6 Click Score: 23    End of Session Equipment Utilized During Treatment: Oxygen Activity Tolerance: Patient tolerated treatment well Patient left: in bed;with call bell/phone within reach   PT Visit Diagnosis: Unsteadiness on feet (R26.81);Muscle weakness (generalized) (M62.81);Pain Pain - Right/Left: Left Pain - part of body: Ankle and joints of foot     Time: 1459-1533 PT Time Calculation (min) (ACUTE ONLY): 34 min  Charges:  $Gait Training: 23-37 mins                     Donna Bernard, PT, MPT    [name] 06/28/2020, 3:46 PM

## 2020-06-28 NOTE — Progress Notes (Signed)
Patient ID: Cameron [name], male   DOB: 1960/12/30, 59 y.o.   MRN: [id number] [facility] PROGRESS NOTE  RENALDO GORNICK PPJ:[id number] DOB: December 16, 1960 DOA: 06/23/2020 PCP: Patient, No Pcp Per  HPI/Subjective: Patient feeling better.  He states he does not have any shortness of breath.  He is lying flat.  Today unable to get him off the oxygen with pulse ox of 77%.  Patient offers complaints of some left foot pain still after the procedure.  Patient states he has some pain in his right first toe.  Admitted initially for left foot pain.  Objective: Vitals:   06/28/20 0935 06/28/20 0936  BP:    Pulse:    Resp:    Temp:    SpO2: (!) 77% 93%    Intake/Output Summary (Last 24 hours) at 06/28/2020 1125 Last data filed at 06/28/2020 1031 Gross per 24 hour  Intake 240 ml  Output 700 ml  Net -460 ml   Filed Weights   06/23/20 1651 06/25/20 1034  Weight: 82.6 kg 83 kg    ROS: Review of Systems  Respiratory: Negative for cough and shortness of breath.   Cardiovascular: Negative for chest pain.  Gastrointestinal: Negative for abdominal pain, nausea and vomiting.  Musculoskeletal: Positive for joint pain.   Exam: Physical Exam HENT:     Head: Normocephalic.     Mouth/Throat:     Pharynx: No oropharyngeal exudate.  Eyes:     General: Lids are normal.     Conjunctiva/sclera: Conjunctivae normal.     Pupils: Pupils are equal, round, and reactive to light.  Cardiovascular:     Rate and Rhythm: Normal rate and regular rhythm.     Heart sounds: Normal heart sounds, S1 normal and S2 normal.  Pulmonary:     Breath sounds: Examination of the right-lower field reveals decreased breath sounds. Examination of the left-lower field reveals decreased breath sounds. Decreased breath sounds present. No wheezing, rhonchi or rales.  Abdominal:     Palpations: Abdomen is soft.     Tenderness: There is no abdominal tenderness.  Musculoskeletal:     Right ankle: No swelling.     Left  ankle: Swelling present.  Skin:    General: Skin is warm.     Comments: Left foot still has some redness on it.  Neurological:     Mental Status: He is alert and oriented to person, place, and time.       Data Reviewed: Basic Metabolic Panel: Recent Labs  Lab 06/23/20 1830 06/24/20 0352 06/24/20 1355 06/25/20 0300 06/26/20 0606 06/27/20 0343 06/28/20 0726  NA  --  140 138 139 138 140 138  K 2.4* 3.1* 3.4* 4.5 4.4 4.3 4.7  CL  --  96* 96* 97* 99 98 96*  CO2  --  32 31 32 27 32 32  GLUCOSE  --  124* 122* 92 76 118* 105*  BUN  --  20 20 22* 25* 31* 25*  CREATININE  --  1.37* 1.17 1.25* 1.22 1.38* 1.14  CALCIUM  --  8.5* 8.4* 8.5* 8.7* 8.2* 8.6*  MG 1.5* 2.2  --   --   --   --   --   PHOS 3.1  --   --   --   --   --   --    Liver Function Tests: Recent Labs  Lab 06/24/20 0352  AST 15  ALT 7  ALKPHOS 74  BILITOT 0.8  PROT 7.1  ALBUMIN 3.2*  CBC: Recent Labs  Lab 06/23/20 1652 06/24/20 0352 06/25/20 0300 06/26/20 0606 06/27/20 0343  WBC 11.7* 12.3* 9.4 14.9* 12.0*  HGB 10.5* 10.7* 9.6* 9.9* 9.3*  HCT 31.1* 33.0* 29.2* 30.9* 28.8*  MCV 121.0* 124.1* 124.3* 126.1* 124.1*  PLT 208 211 178 178 169   BNP (last 3 results) Recent Labs    02/17/20 0526 06/27/20 0343  BNP 2,228.8* 3,221.6*     Recent Results (from the past 240 hour(s))  SARS CORONAVIRUS 2 (TAT 6-24 HRS) Nasopharyngeal Nasopharyngeal Swab     Status: None   Collection Time: 06/21/20 11:56 AM   Specimen: Nasopharyngeal Swab  Result Value Ref Range Status   SARS Coronavirus 2 NEGATIVE NEGATIVE Final    Comment: (NOTE) SARS-CoV-2 target nucleic acids are NOT DETECTED.  The SARS-CoV-2 RNA is generally detectable in upper and lower respiratory specimens during the acute phase of infection. Negative results do not preclude SARS-CoV-2 infection, do not rule out co-infections with other pathogens, and should not be used as the sole basis for treatment or other patient management  decisions. Negative results must be combined with clinical observations, patient history, and epidemiological information. The expected result is Negative.  Fact Sheet for Patients: HairSlick.no  Fact Sheet for Healthcare Providers: quierodirigir.com  This test is not yet approved or cleared by the [place] FDA and  has been authorized for detection and/or diagnosis of SARS-CoV-2 by FDA under an Emergency Use Authorization (EUA). This EUA will remain  in effect (meaning this test can be used) for the duration of the COVID-19 declaration under Se ction 564(b)(1) of the Act, 21 U.S.C. section 360bbb-3(b)(1), unless the authorization is terminated or revoked sooner.  Performed at [facility], [address] [place], [place] [address]   Resp Panel by RT-PCR (Flu A&B, Covid) Nasopharyngeal Swab     Status: None   Collection Time: 06/24/20  5:44 AM   Specimen: Nasopharyngeal Swab; Nasopharyngeal(NP) swabs in vial transport medium  Result Value Ref Range Status   SARS Coronavirus 2 by RT PCR NEGATIVE NEGATIVE Final    Comment: (NOTE) SARS-CoV-2 target nucleic acids are NOT DETECTED.  The SARS-CoV-2 RNA is generally detectable in upper respiratory specimens during the acute phase of infection. The lowest concentration of SARS-CoV-2 viral copies this assay can detect is 138 copies/mL. A negative result does not preclude SARS-Cov-2 infection and should not be used as the sole basis for treatment or other patient management decisions. A negative result may occur with  improper specimen collection/handling, submission of specimen other than nasopharyngeal swab, presence of viral mutation(s) within the areas targeted by this assay, and inadequate number of viral copies(<138 copies/mL). A negative result must be combined with clinical observations, patient history, and epidemiological information. The expected result is  Negative.  Fact Sheet for Patients:  BloggerCourse.com  Fact Sheet for Healthcare Providers:  SeriousBroker.it  This test is no t yet approved or cleared by the [place] FDA and  has been authorized for detection and/or diagnosis of SARS-CoV-2 by FDA under an Emergency Use Authorization (EUA). This EUA will remain  in effect (meaning this test can be used) for the duration of the COVID-19 declaration under Section 564(b)(1) of the Act, 21 U.S.C.section 360bbb-3(b)(1), unless the authorization is terminated  or revoked sooner.       Influenza A by PCR NEGATIVE NEGATIVE Final   Influenza B by PCR NEGATIVE NEGATIVE Final    Comment: (NOTE) The Xpert Xpress SARS-CoV-2/FLU/RSV plus assay is intended as  an aid in the diagnosis of influenza from Nasopharyngeal swab specimens and should not be used as a sole basis for treatment. Nasal washings and aspirates are unacceptable for Xpert Xpress SARS-CoV-2/FLU/RSV testing.  Fact Sheet for Patients: BloggerCourse.com  Fact Sheet for Healthcare Providers: SeriousBroker.it  This test is not yet approved or cleared by the [place] FDA and has been authorized for detection and/or diagnosis of SARS-CoV-2 by FDA under an Emergency Use Authorization (EUA). This EUA will remain in effect (meaning this test can be used) for the duration of the COVID-19 declaration under Section 564(b)(1) of the Act, 21 U.S.C. section 360bbb-3(b)(1), unless the authorization is terminated or revoked.  Performed at [facility], [address] [place], [place] [address]      Studies: [facility] Chest [place] 1 View  Result Date: 06/26/2020 CLINICAL DATA:  Shortness of breath. EXAM: PORTABLE CHEST 1 VIEW COMPARISON:  One-view chest x-ray 02/17/2020 FINDINGS: Heart is enlarged. Mild to moderate pulmonary vascular congestion is present. No significant  edema is present. Mild atelectasis is present at the bases. IMPRESSION: Cardiomegaly and mild to moderate pulmonary vascular congestion without frank edema. Electronically Signed   By: Marin Roberts M.D.   On: 06/26/2020 13:49    Scheduled Meds: . apixaban  5 mg Oral BID  . atorvastatin  80 mg Oral Daily  . carvedilol  3.125 mg Oral BID WC  . colchicine  0.6 mg Oral Daily  . [START ON 06/29/2020] COVID-19 mRNA vaccine (Pfizer)  0.3 mL Intramuscular ONCE-1600  . ferrous sulfate  325 mg Oral BID WC  . folic acid  1 mg Oral Daily  . furosemide  40 mg Intravenous BID  . polyethylene glycol  17 g Oral Daily  . vitamin B-12  1,000 mcg Oral Daily    Assessment/Plan:  1. Acute hypoxic respiratory failure with pulse ox of 77% on room air.  Continue to try to taper off oxygen.  May end up needing home oxygen if pulse ox still low tomorrow. 2. Acute systolic congestive heart failure.  Increase Lasix to 40 mg IV twice daily.  Continue Coreg.  Blood pressure on the lower side and too low for other medications at this point. 3. Peripheral vascular disease with left foot pain.  Patient status post procedure by vascular surgery on 06/25/2020.  Please see operative report.  Patient on Eliquis for anticoagulation 4. Relative hypotension.  Continue to monitor slowly 5. Hyperlipidemia unspecified on high-dose Lipitor 6. History of CAD on Eliquis, Coreg and atorvastatin 7. Weakness.  Physical therapy initially recommended rehab.  Patient interested in going home. 8. Chronic kidney disease stage II with today's labs creatinine of 1.14.        Code Status:     Code Status Orders  (From admission, onward)         Start     Ordered   06/23/20 2058  Full code  Continuous        06/23/20 2101        Code Status History    Date Active Date Inactive Code Status Order ID Comments User Context   02/17/2020 0801 02/21/2020 1917 Full Code [id number]  Lorretta Harp, MD ED   02/02/2018 0414 02/03/2018  2118 Full Code [id number]  Arnaldo Natal, MD Inpatient   06/17/2016 0630 06/20/2016 1840 Full Code [id number]  Runell Gess, MD Inpatient   Advance Care Planning Activity     Disposition Plan: Status is: Inpatient  Dispo: The patient is from: Home  Anticipated d/c is to: Home              Anticipated d/c date is: 06/29/2020              Patient currently unable to get off oxygen today with pulse ox dropping low with ambulation.  Increase Lasix to 40 mg IV twice daily and see if we can get him off oxygen prior to disposition.  Time spent: 28 minutes, case discussed with nursing staff  Alford Highland  [facility]

## 2020-06-29 DIAGNOSIS — J9621 Acute and chronic respiratory failure with hypoxia: Secondary | ICD-10-CM

## 2020-06-29 DIAGNOSIS — M109 Gout, unspecified: Secondary | ICD-10-CM

## 2020-06-29 MED ORDER — COLCHICINE 0.6 MG PO TABS: 0.6000 mg | ORAL_TABLET | Freq: Every day | ORAL | 0 refills | Status: DC | PRN

## 2020-06-29 MED ORDER — APIXABAN 5 MG PO TABS: 5.0000 mg | ORAL_TABLET | Freq: Two times a day (BID) | ORAL | 0 refills | Status: DC

## 2020-06-29 MED ORDER — VENTOLIN HFA 108 (90 BASE) MCG/ACT IN AERS: 2.0000 | INHALATION_SPRAY | Freq: Four times a day (QID) | RESPIRATORY_TRACT | 0 refills | Status: DC | PRN

## 2020-06-29 MED ORDER — HYDROCODONE-ACETAMINOPHEN 5-325 MG PO TABS: 1.0000 | ORAL_TABLET | ORAL | 0 refills | Status: DC | PRN

## 2020-06-29 MED ORDER — FUROSEMIDE 40 MG PO TABS: 20.0000 mg | ORAL_TABLET | Freq: Two times a day (BID) | ORAL | 0 refills | Status: DC

## 2020-06-29 MED ORDER — POTASSIUM CHLORIDE ER 10 MEQ PO TBCR: 10.0000 meq | EXTENDED_RELEASE_TABLET | Freq: Every day | ORAL | 0 refills | Status: DC

## 2020-06-29 MED ORDER — FERROUS SULFATE 325 (65 FE) MG PO TABS: 325.0000 mg | ORAL_TABLET | Freq: Every day | ORAL | 0 refills | Status: DC

## 2020-06-29 MED ORDER — CARVEDILOL 3.125 MG PO TABS: 3.1250 mg | ORAL_TABLET | Freq: Two times a day (BID) | ORAL | 0 refills | Status: DC

## 2020-06-29 MED ORDER — CYANOCOBALAMIN 1000 MCG PO TABS
1000.0000 ug | ORAL_TABLET | Freq: Every day | ORAL | 0 refills | Status: DC
Start: 2020-06-30 — End: 2021-04-11

## 2020-06-29 MED ORDER — POLYETHYLENE GLYCOL 3350 17 G PO PACK: 17.0000 g | PACK | Freq: Every day | ORAL | 0 refills | Status: DC | PRN

## 2020-06-29 NOTE — Discharge Summary (Addendum)
[facility]   PATIENT NAME: Cameron [name]    MR#:  [id number]  DATE OF BIRTH:  08-Jul-1960  DATE OF ADMISSION:  06/23/2020 ADMITTING PHYSICIAN: Lanae Boast, MD  DATE OF DISCHARGE: 06/29/2020 12:25 PM  PRIMARY CARE PHYSICIAN: Patient, No Pcp Per    ADMISSION DIAGNOSIS:  Hypokalemia [E87.6] PVD (peripheral vascular disease) (HCC) [I73.9]  DISCHARGE DIAGNOSIS:  Principal Problem:   Hypokalemia Active Problems:   Essential hypertension   Hyperlipidemia   CAD (coronary artery disease), native coronary artery   Acute systolic CHF (congestive heart failure) (HCC)   Hypomagnesemia   PVD (peripheral vascular disease) (HCC)   AKI (acute kidney injury) (HCC)   Macrocytic anemia   Hypotension   Acute respiratory failure with hypoxia (HCC)   CKD (chronic kidney disease), stage II   SECONDARY DIAGNOSIS:   Past Medical History:  Diagnosis Date  . CHF (congestive heart failure) (HCC)   . Coronary artery disease    a. 12/17 PCI with DES to pLAD with resdiual disease (RX therapy)  . Hyperlipidemia   . Hypertension   . PAD (peripheral artery disease) (HCC)   . STEMI (ST elevation myocardial infarction) (HCC) 2017    HOSPITAL COURSE:   1. Severe peripheral vascular disease with left lower extremity pain and redness. Dr. Wyn Quaker took the patient for procedure on 06/25/2020 patient had 3 angioplasties and 2 stents placed to get better blood supply to the left foot. Patient was placed back on Eliquis. Patient still has some pain in his left foot but better than prior to coming into the hospital. Follow-up with Dr. Wyn Quaker as outpatient. 2. Acute and now chronic hypoxic respiratory failure. Patient did have a pulse ox again today that dropped down in the 70s on room air with ambulation. Continue oxygen 2 L nasal cannula 24/7. Home oxygen set up. 3. Acute on chronic systolic congestive heart failure. We did increase Lasix to 40 mg IV twice a day to try to  get rid of as much fluid as possible to get him off the oxygen. Unfortunately the patient still required oxygen upon going home. Blood pressure on the lower side upon going home so limited with medications at this point unable to give ARB, ACE, Entresto or spironolactone at this time. Patient on low-dose Coreg and low-dose Lasix. Follow-up at the CHF clinic and with Dr. [name] as outpatient. 4. Relative hypotension. Blood pressure on the lower side on low-dose Coreg and low-dose Lasix. 5. Hyperlipidemia unspecified on high-dose Lipitor 6. History of CAD on Eliquis, Coreg and atorvastatin 7. Weakness. Physical therapy initially recommended rehab. Patient interested in going home. Since the patient cannot go back to work with oxygen we will set up home health. 8. Gout. Uric acid elevated 10.1. We are giving colchicine while here. Will benefit from allopurinol as outpatient. Will not give an acute attack. Patient complains of right toe pain. 9. Vitamin B12 deficiency. Oral vitamin B12 prescribed 10. Likely iron deficiency anemia. Oral iron prescribed 11. Chronic kidney disease stage II  DISCHARGE CONDITIONS:   Satisfactory  CONSULTS OBTAINED:  Treatment Team:  Annice Needy, MD  DRUG ALLERGIES:  No Known Allergies  DISCHARGE MEDICATIONS:   Allergies as of 06/29/2020   No Known Allergies     Medication List    STOP taking these medications   amiodarone 200 MG tablet Commonly known as: PACERONE   lisinopril 20 MG tablet Commonly known as: ZESTRIL   ondansetron 4 MG disintegrating tablet Commonly  known as: ZOFRAN-ODT     TAKE these medications   apixaban 5 MG Tabs tablet Commonly known as: ELIQUIS Take 1 tablet (5 mg total) by mouth 2 (two) times daily.   atorvastatin 80 MG tablet Commonly known as: LIPITOR Take 1 tablet (80 mg total) by mouth daily.   carvedilol 3.125 MG tablet Commonly known as: COREG Take 1 tablet (3.125 mg total) by mouth 2 (two) times daily with a  meal. What changed:   medication strength  how much to take   colchicine 0.6 MG tablet Take 1 tablet (0.6 mg total) by mouth daily as needed. What changed:   when to take this  reasons to take this   cyanocobalamin 1000 MCG tablet Take 1 tablet (1,000 mcg total) by mouth daily. Start taking on: June 30, 2020   ferrous sulfate 325 (65 FE) MG tablet Take 1 tablet (325 mg total) by mouth daily with breakfast.   furosemide 40 MG tablet Commonly known as: Lasix Take 0.5 tablets (20 mg total) by mouth 2 (two) times daily. What changed: how much to take   HYDROcodone-acetaminophen 5-325 MG tablet Commonly known as: NORCO/VICODIN Take 1 tablet by mouth every 4 (four) hours as needed for moderate pain.   nitroGLYCERIN 0.4 MG SL tablet Commonly known as: Nitrostat Place 1 tablet (0.4 mg total) under the tongue every 5 (five) minutes as needed.   polyethylene glycol 17 g packet Commonly known as: MIRALAX / GLYCOLAX Take 17 g by mouth daily as needed for moderate constipation.   potassium chloride 10 MEQ tablet Commonly known as: KLOR-CON Take 1 tablet (10 mEq total) by mouth daily.   Ventolin HFA 108 (90 Base) MCG/ACT inhaler Generic drug: albuterol Inhale 2 puffs into the lungs every 6 (six) hours as needed for wheezing or shortness of breath.            Durable Medical Equipment  (From admission, onward)         Start     Ordered   06/28/20 1601  For home use only DME oxygen  Once       Question Answer Comment  Length of Need Lifetime   Mode or (Route) Nasal cannula   Liters per Minute 2   Frequency Continuous (stationary and portable oxygen unit needed)   Oxygen conserving device Yes   Oxygen delivery system Gas      06/28/20 1600           DISCHARGE INSTRUCTIONS:   Follow-up Dr. Wyn Quaker vascular surgery 2 weeks Follow-up new PCP Follow-up CHF clinic Follow-up Dr. [name]  If you experience worsening of your admission symptoms, develop  shortness of breath, life threatening emergency, suicidal or homicidal thoughts you must seek medical attention immediately by calling 911 or calling your MD immediately  if symptoms less severe.  You Must read complete instructions/literature along with all the possible adverse reactions/side effects for all the Medicines you take and that have been prescribed to you. Take any new Medicines after you have completely understood and accept all the possible adverse reactions/side effects.   Please note  You were cared for by a hospitalist during your hospital stay. If you have any questions about your discharge medications or the care you received while you were in the hospital after you are discharged, you can call the unit and asked to speak with the hospitalist on call if the hospitalist that took care of you is not available. Once you are discharged, your primary care physician  will handle any further medical issues. Please note that NO REFILLS for any discharge medications will be authorized once you are discharged, as it is imperative that you return to your primary care physician (or establish a relationship with a primary care physician if you do not have one) for your aftercare needs so that they can reassess your need for medications and monitor your lab values.    Today   CHIEF COMPLAINT:   Chief Complaint  Patient presents with  . PAD    HISTORY OF PRESENT ILLNESS:  Cameron [name]  is a 58 y.o. male came in with left foot pain   VITAL SIGNS:  Blood pressure 96/70, pulse 81, temperature (!) 97.5 F (36.4 C), resp. rate 18, height 5\' 7"  (1.702 m), weight 83 kg, SpO2 100 %.  I/O:    Intake/Output Summary (Last 24 hours) at 06/29/2020 1557 Last data filed at 06/29/2020 1053 Gross per 24 hour  Intake 240 ml  Output 1250 ml  Net -1010 ml    PHYSICAL EXAMINATION:  GENERAL:  60 y.o.-year-old patient lying in the bed with no acute distress.  EYES: Pupils equal, round,  reactive to light and accommodation. No scleral icterus.  HEENT: Head atraumatic, normocephalic. Oropharynx and nasopharynx clear.  LUNGS: Normal breath sounds bilaterally, no wheezing, rales,rhonchi or crepitation. No use of accessory muscles of respiration.  CARDIOVASCULAR: S1, S2 normal. No murmurs, rubs, or gallops.  ABDOMEN: Soft, non-tender, non-distended.  EXTREMITIES: Trace left pedal edema.  NEUROLOGIC: Cranial nerves II through XII are intact. Muscle strength 5/5 in all extremities. Sensation intact. Gait not checked.  PSYCHIATRIC: The patient is alert and oriented x 3.  SKIN: No obvious rash, lesion, or ulcer.   DATA REVIEW:   CBC Recent Labs  Lab 06/27/20 0343  WBC 12.0*  HGB 9.3*  HCT 28.8*  PLT 169    Chemistries  Recent Labs  Lab 06/24/20 0352 06/24/20 1355 06/28/20 0726  NA 140   < > 138  K 3.1*   < > 4.7  CL 96*   < > 96*  CO2 32   < > 32  GLUCOSE 124*   < > 105*  BUN 20   < > 25*  CREATININE 1.37*   < > 1.14  CALCIUM 8.5*   < > 8.6*  MG 2.2  --   --   AST 15  --   --   ALT 7  --   --   ALKPHOS 74  --   --   BILITOT 0.8  --   --    < > = values in this interval not displayed.    Microbiology Results  Results for orders placed or performed during the hospital encounter of 06/23/20  Resp Panel by RT-PCR (Flu A&B, Covid) Nasopharyngeal Swab     Status: None   Collection Time: 06/24/20  5:44 AM   Specimen: Nasopharyngeal Swab; Nasopharyngeal(NP) swabs in vial transport medium  Result Value Ref Range Status   SARS Coronavirus 2 by RT PCR NEGATIVE NEGATIVE Final    Comment: (NOTE) SARS-CoV-2 target nucleic acids are NOT DETECTED.  The SARS-CoV-2 RNA is generally detectable in upper respiratory specimens during the acute phase of infection. The lowest concentration of SARS-CoV-2 viral copies this assay can detect is 138 copies/mL. A negative result does not preclude SARS-Cov-2 infection and should not be used as the sole basis for treatment  or other patient management decisions. A negative result may occur with  improper specimen collection/handling,  submission of specimen other than nasopharyngeal swab, presence of viral mutation(s) within the areas targeted by this assay, and inadequate number of viral copies(<138 copies/mL). A negative result must be combined with clinical observations, patient history, and epidemiological information. The expected result is Negative.  Fact Sheet for Patients:  BloggerCourse.com  Fact Sheet for Healthcare Providers:  SeriousBroker.it  This test is no t yet approved or cleared by the [place] FDA and  has been authorized for detection and/or diagnosis of SARS-CoV-2 by FDA under an Emergency Use Authorization (EUA). This EUA will remain  in effect (meaning this test can be used) for the duration of the COVID-19 declaration under Section 564(b)(1) of the Act, 21 U.S.C.section 360bbb-3(b)(1), unless the authorization is terminated  or revoked sooner.       Influenza A by PCR NEGATIVE NEGATIVE Final   Influenza B by PCR NEGATIVE NEGATIVE Final    Comment: (NOTE) The Xpert Xpress SARS-CoV-2/FLU/RSV plus assay is intended as an aid in the diagnosis of influenza from Nasopharyngeal swab specimens and should not be used as a sole basis for treatment. Nasal washings and aspirates are unacceptable for Xpert Xpress SARS-CoV-2/FLU/RSV testing.  Fact Sheet for Patients: BloggerCourse.com  Fact Sheet for Healthcare Providers: SeriousBroker.it  This test is not yet approved or cleared by the [place] FDA and has been authorized for detection and/or diagnosis of SARS-CoV-2 by FDA under an Emergency Use Authorization (EUA). This EUA will remain in effect (meaning this test can be used) for the duration of the COVID-19 declaration under Section 564(b)(1) of the Act, 21 U.S.C. section  360bbb-3(b)(1), unless the authorization is terminated or revoked.  Performed at [facility], [address] [place], [place] [address]     Management plans discussed with the patient, family and they are in agreement.  CODE STATUS:     Code Status Orders  (From admission, onward)         Start     Ordered   06/23/20 2058  Full code  Continuous        06/23/20 2101        Code Status History    Date Active Date Inactive Code Status Order ID Comments User Context   02/17/2020 0801 02/21/2020 1917 Full Code [id number]  Lorretta Harp, MD ED   02/02/2018 0414 02/03/2018 2118 Full Code [id number]  Arnaldo Natal, MD Inpatient   06/17/2016 0630 06/20/2016 1840 Full Code [id number]  Runell Gess, MD Inpatient   Advance Care Planning Activity      TOTAL TIME TAKING CARE OF THIS PATIENT: 35 minutes.    Alford Highland M.D on 06/29/2020 at 3:57 PM  Between 7am to 6pm - Pager - [phone]  After 6pm go to [email or web address] - password [email or web address]  [facility]  CC: Primary care physician; Patient, No Pcp Per   Addendum.  Not prescribed aspirin or Plavix secondary to bleeding risk because they are already on Eliquis upon discharge.

## 2020-06-29 NOTE — TOC Transition Note (Signed)
Transition of Care [facility]) - CM/SW Discharge Note   Patient Details  Name: ASON HESLIN MRN: [id number] Date of Birth: 01/25/1961  [facility] CM/SW Contact:  Allayne Butcher, RN Phone Number: 06/29/2020, 10:48 AM   Clinical Narrative:    Patient medically cleared for discharge home with home health services.  [facility] has agreed to accept patient for home health RN and PT.  Patient is also going home with oxygen provided by [employer].  Oxygen tank has been delivered to the patient's room, patient is to call [employer] once home so they can deliver his home set up.  RNCM provided patient with TOC donated pulse oximter for home use.  Patient reports that his truck is in the parking lot and he can drive himself home.     Final next level of care: Home w Home Health Services Barriers to Discharge: Barriers Resolved   Patient Goals and CMS Choice Patient states their goals for this hospitalization and ongoing recovery are:: Ready to get home CMS Medicare.gov Compare Post Acute Care list provided to:: Patient Choice offered to / list presented to : Patient  Discharge Placement                       Discharge Plan and Services                DME Arranged: Oxygen,Pulse oximeter DME Agency: Other - Comment Loyal [facility]) Date DME Agency Contacted: 06/28/20   Representative spoke with at DME Agency: Vaughan Basta HH Arranged: RN,PT HH Agency: [facility] Date [facility] Agency Contacted: 06/29/20 Time HH Agency Contacted: 0930 Representative spoke with at [facility] Agency: [place]  Social Determinants of Health (SDOH) Interventions     Readmission Risk Interventions No flowsheet data found.

## 2020-07-02 ENCOUNTER — Telehealth: Payer: Self-pay | Admitting: Family

## 2020-07-02 NOTE — Telephone Encounter (Signed)
Unable to reach patient in attempt to schedule a new patient appointment with CHF Clinic after receiving a hospital referral.   Joice Lofts, NT

## 2020-07-09 ENCOUNTER — Other Ambulatory Visit (INDEPENDENT_AMBULATORY_CARE_PROVIDER_SITE_OTHER): Payer: Self-pay | Admitting: Vascular Surgery

## 2020-07-09 DIAGNOSIS — I70222 Atherosclerosis of native arteries of extremities with rest pain, left leg: Secondary | ICD-10-CM

## 2020-07-09 DIAGNOSIS — Z95820 Peripheral vascular angioplasty status with implants and grafts: Secondary | ICD-10-CM

## 2020-07-10 ENCOUNTER — Other Ambulatory Visit: Payer: Self-pay

## 2020-07-10 ENCOUNTER — Encounter (INDEPENDENT_AMBULATORY_CARE_PROVIDER_SITE_OTHER): Payer: Self-pay | Admitting: Vascular Surgery

## 2020-07-10 ENCOUNTER — Ambulatory Visit (INDEPENDENT_AMBULATORY_CARE_PROVIDER_SITE_OTHER): Payer: Commercial Managed Care - PPO | Admitting: Vascular Surgery

## 2020-07-10 ENCOUNTER — Ambulatory Visit (INDEPENDENT_AMBULATORY_CARE_PROVIDER_SITE_OTHER): Payer: Commercial Managed Care - PPO

## 2020-07-10 VITALS — BP 117/80 | HR 85 | Resp 16 | Ht 67.0 in | Wt 191.0 lb

## 2020-07-10 DIAGNOSIS — Z95820 Peripheral vascular angioplasty status with implants and grafts: Secondary | ICD-10-CM

## 2020-07-10 DIAGNOSIS — I5023 Acute on chronic systolic (congestive) heart failure: Secondary | ICD-10-CM | POA: Diagnosis not present

## 2020-07-10 DIAGNOSIS — I70222 Atherosclerosis of native arteries of extremities with rest pain, left leg: Secondary | ICD-10-CM | POA: Diagnosis not present

## 2020-07-10 DIAGNOSIS — R600 Localized edema: Secondary | ICD-10-CM | POA: Diagnosis not present

## 2020-07-10 DIAGNOSIS — E785 Hyperlipidemia, unspecified: Secondary | ICD-10-CM | POA: Diagnosis not present

## 2020-07-10 DIAGNOSIS — I10 Essential (primary) hypertension: Secondary | ICD-10-CM

## 2020-07-10 NOTE — Assessment & Plan Note (Signed)
Noninvasive studies today show strong biphasic waveforms in the left lower extremity now with an ABI of 1.09.  His right ABI 0.7. Overall he is doing well.  No right leg symptoms at this time.  Continue current medical regimen.  Recheck in 3 months

## 2020-07-10 NOTE — Assessment & Plan Note (Signed)
blood pressure control important in reducing the progression of atherosclerotic disease. On appropriate oral medications.  

## 2020-07-10 NOTE — Progress Notes (Signed)
MRN : [id number]  Cameron [name] is a 60 y.o. (04-Oct-1960) male who presents with chief complaint of  Chief Complaint  Patient presents with  . Follow-up    ultrasound  .  History of Present Illness: Patient returns today in follow up of PAD.  He is about 2 weeks status post left lower extremity revascularization which was fairly extensive for rest pain.  He is doing well.  He has had fairly significant reperfusion swelling although it is getting better.  He had some reperfusion pain that was severe for about 4 or 5 days but is gradually getting better as well.  His mobility is much better now than it was before his procedure.  He still is only walking about 50 feet before having to stop and rest.  He has not yet gone back to work but hopes to go back to work next week.  Noninvasive studies today show strong biphasic waveforms in the left lower extremity now with an ABI of 1.09.  His right ABI 0.7.  Current Outpatient Medications  Medication Sig Dispense Refill  . apixaban (ELIQUIS) 5 MG TABS tablet Take 1 tablet (5 mg total) by mouth 2 (two) times daily. 60 tablet 0  . atorvastatin (LIPITOR) 80 MG tablet Take 1 tablet (80 mg total) by mouth daily.    . carvedilol (COREG) 3.125 MG tablet Take 1 tablet (3.125 mg total) by mouth 2 (two) times daily with a meal. 60 tablet 0  . colchicine 0.6 MG tablet Take 1 tablet (0.6 mg total) by mouth daily as needed. 30 tablet 0  . ferrous sulfate 325 (65 FE) MG tablet Take 1 tablet (325 mg total) by mouth daily with breakfast. 30 tablet 0  . furosemide (LASIX) 40 MG tablet Take 0.5 tablets (20 mg total) by mouth 2 (two) times daily. 60 tablet 0  . HYDROcodone-acetaminophen (NORCO/VICODIN) 5-325 MG tablet Take 1 tablet by mouth every 4 (four) hours as needed for moderate pain. 10 tablet 0  . nitroGLYCERIN (NITROSTAT) 0.4 MG SL tablet Place 1 tablet (0.4 mg total) under the tongue every 5 (five) minutes as needed. 25 tablet 3  . potassium chloride  (KLOR-CON) 10 MEQ tablet Take 1 tablet (10 mEq total) by mouth daily. 30 tablet 0  . vitamin B-12 1000 MCG tablet Take 1 tablet (1,000 mcg total) by mouth daily. 30 tablet 0  . polyethylene glycol (MIRALAX / GLYCOLAX) 17 g packet Take 17 g by mouth daily as needed for moderate constipation. (Patient not taking: Reported on 07/10/2020) 30 each 0  . VENTOLIN HFA 108 (90 Base) MCG/ACT inhaler Inhale 2 puffs into the lungs every 6 (six) hours as needed for wheezing or shortness of breath. (Patient not taking: Reported on 07/10/2020) 18 g 0   No current facility-administered medications for this visit.    Past Medical History:  Diagnosis Date  . CHF (congestive heart failure) (HCC)   . Coronary artery disease    a. 12/17 PCI with DES to pLAD with resdiual disease (RX therapy)  . Hyperlipidemia   . Hypertension   . PAD (peripheral artery disease) (HCC)   . STEMI (ST elevation myocardial infarction) (HCC) 2017    Past Surgical History:  Procedure Laterality Date  . CARDIAC CATHETERIZATION N/A 06/17/2016   Procedure: Left Heart Cath and Coronary Angiography;  Surgeon: Runell Gess, MD;  Location: [facility] INVASIVE CV LAB;  Service: Cardiovascular;  Laterality: N/A;  . CARDIAC CATHETERIZATION N/A 06/17/2016   Procedure: Coronary Stent  Intervention;  Surgeon: Runell GessJonathan J Berry, MD;  Location[facility] INVASIVE CV LAB;  Service: Cardiovascular;  Laterality: N/A;  . cardiac stents    . CERVICAL SPINE SURGERY    . LOWER EXTREMITY ANGIOGRAPHY Left 06/25/2020   Procedure: LOWER EXTREMITY ANGIOGRAPHY;  Surgeon: Annice Needyew, Dereona Kolodny S, MD;  Location: [facility] INVASIVE CV LAB;  Service: Cardiovascular;  Laterality: Left;     Social History   Tobacco Use  . Smoking status: Current Every Day Smoker    Packs/day: 1.00    Types: Cigarettes  . Smokeless tobacco: Never Used  Vaping Use  . Vaping Use: Never used  Substance Use Topics  . Alcohol use: Yes    Comment: weekends  . Drug use: Yes    Frequency: 1.0 times per week     Types: Marijuana    Comment: twice monthly      Family History  Problem Relation Age of Onset  . CAD Paternal Grandfather   . Breast cancer Mother   . Parkinson's disease Father   . Alzheimer's disease Father   . Hypertension Father   . Diabetes Father     No Known Allergies    REVIEW OF SYSTEMS (Negative unless checked)  Constitutional: [] ?Weight loss  [] ?Fever  [] ?Chills Cardiac: [] ?Chest pain   [] ?Chest pressure   [] ?Palpitations   [] ?Shortness of breath when laying flat   [] ?Shortness of breath at rest   [] ?Shortness of breath with exertion. Vascular:  [x] ?Pain in legs with walking   [] ?Pain in legs at rest   [] ?Pain in legs when laying flat   [x] ?Claudication   [x] ?Pain in feet when walking  [x] ?Pain in feet at rest  [] ?Pain in feet when laying flat   [] ?History of DVT   [] ?Phlebitis   [x] ?Swelling in legs   [] ?Varicose veins   [] ?Non-healing ulcers Pulmonary:   [] ?Uses home oxygen   [] ?Productive cough   [] ?Hemoptysis   [] ?Wheeze  [] ?COPD   [] ?Asthma Neurologic:  [] ?Dizziness  [] ?Blackouts   [] ?Seizures   [] ?History of stroke   [] ?History of TIA  [] ?Aphasia   [] ?Temporary blindness   [] ?Dysphagia   [] ?Weakness or numbness in arms   [] ?Weakness or numbness in legs Musculoskeletal:  [x] ?Arthritis   [] ?Joint swelling   [x] ?Joint pain   [] ?Low back pain Hematologic:  [] ?Easy bruising  [] ?Easy bleeding   [] ?Hypercoagulable state   [] ?Anemic  [] ?Hepatitis Gastrointestinal:  [] ?Blood in stool   [] ?Vomiting blood  [] ?Gastroesophageal reflux/heartburn   [] ?Abdominal pain Genitourinary:  [] ?Chronic kidney disease   [] ?Difficult urination  [] ?Frequent urination  [] ?Burning with urination   [] ?Hematuria Skin:  [] ?Rashes   [] ?Ulcers   [] ?Wounds Psychological:  [] ?History of anxiety   [] ? History of major depression.  Physical Examination  BP 117/80 (BP Location: Right Arm)   Pulse 85   Resp 16   Ht 5\' 7"  (1.702 m)   Wt 191 lb (86.6 kg)   BMI 29.91 kg/m  Gen:  WD/WN, NAD Head:  [place]/AT, No temporalis wasting. Ear/Nose/Throat: Hearing grossly intact, nares w/o erythema or drainage Eyes: Conjunctiva clear. Sclera non-icteric Neck: Supple.  Trachea midline Pulmonary:  Good air movement, no use of accessory muscles.  Cardiac: RRR, no JVD Vascular:  Vessel Right Left  Radial Palpable Palpable                          PT 1+ Palpable 1+ Palpable  DP 1+ Palpable 1+ Palpable    Musculoskeletal: M/S 5/5 throughout.  No deformity  or atrophy. 1+ LLE edema. Neurologic: Sensation grossly intact in extremities.  Symmetrical.  Speech is fluent.  Psychiatric: Judgment intact, Mood & affect appropriate for pt's clinical situation. Dermatologic: No rashes or ulcers noted.  No cellulitis or open wounds.       Labs Recent Results (from the past 2160 hour(s))  SARS CORONAVIRUS 2 (TAT 6-24 HRS) Nasopharyngeal Nasopharyngeal Swab     Status: None   Collection Time: 06/21/20 11:56 AM   Specimen: Nasopharyngeal Swab  Result Value Ref Range   SARS Coronavirus 2 NEGATIVE NEGATIVE    Comment: (NOTE) SARS-CoV-2 target nucleic acids are NOT DETECTED.  The SARS-CoV-2 RNA is generally detectable in upper and lower respiratory specimens during the acute phase of infection. Negative results do not preclude SARS-CoV-2 infection, do not rule out co-infections with other pathogens, and should not be used as the sole basis for treatment or other patient management decisions. Negative results must be combined with clinical observations, patient history, and epidemiological information. The expected result is Negative.  Fact Sheet for Patients: HairSlick.no  Fact Sheet for Healthcare Providers: quierodirigir.com  This test is not yet approved or cleared by the [place] FDA and  has been authorized for detection and/or diagnosis of SARS-CoV-2 by FDA under an Emergency Use Authorization (EUA). This EUA will remain  in  effect (meaning this test can be used) for the duration of the COVID-19 declaration under Se ction 564(b)(1) of the Act, 21 U.S.C. section 360bbb-3(b)(1), unless the authorization is terminated or revoked sooner.  Performed at [facility], [address], [place], [place] [address]   CBC     Status: Abnormal   Collection Time: 06/23/20  4:52 PM  Result Value Ref Range   WBC 11.7 (H) 4.0 - 10.5 K/uL   RBC 2.57 (L) 4.22 - 5.81 MIL/uL   Hemoglobin 10.5 (L) 13.0 - 17.0 g/dL   HCT 21.3 (L) 08.6 - 57.8 %   MCV 121.0 (H) 80.0 - 100.0 fL   MCH 40.9 (H) 26.0 - 34.0 pg   MCHC 33.8 30.0 - 36.0 g/dL   RDW 46.9 (H) 62.9 - 52.8 %   Platelets 208 150 - 400 K/uL   nRBC 0.0 0.0 - 0.2 %    Comment: Performed at [facility], [address], [place], [place] [address]  Basic metabolic panel     Status: Abnormal   Collection Time: 06/23/20  4:52 PM  Result Value Ref Range   Sodium 139 135 - 145 mmol/L   Potassium 2.2 (LL) 3.5 - 5.1 mmol/L    Comment: CRITICAL RESULT CALLED TO, READ BACK BY AND VERIFIED WITH TIFFANY JOHNSON @1718  06/23/20 MJU    Chloride 96 (L) 98 - 111 mmol/L   CO2 33 (H) 22 - 32 mmol/L   Glucose, Bld 123 (H) 70 - 99 mg/dL    Comment: Glucose reference range applies only to samples taken after fasting for at least 8 hours.   BUN 23 (H) 6 - 20 mg/dL   Creatinine, Ser 4.01 (H) 0.61 - 1.24 mg/dL   Calcium 8.3 (L) 8.9 - 10.3 mg/dL   GFR, Estimated 51 (L) >60 mL/min    Comment: (NOTE) Calculated using the CKD-EPI Creatinine Equation (2021)    Anion gap 10 5 - 15    Comment: Performed at [facility], [address] [place], [place] [address]  Potassium     Status: Abnormal   Collection Time: 06/23/20  6:30 PM  Result Value Ref  Range   Potassium 2.4 (LL) 3.5 - 5.1 mmol/L    Comment: CRITICAL RESULT CALLED TO, READ BACK BY AND VERIFIED WITH CHRISTINE HALLAS 06/23/20 1950 KBH Performed at [facility], [address], [place], [place]  [address]   Magnesium     Status: Abnormal   Collection Time: 06/23/20  6:30 PM  Result Value Ref Range   Magnesium 1.5 (L) 1.7 - 2.4 mg/dL    Comment: Performed at [facility], [address] [place], [place] [address]  Phosphorus     Status: None   Collection Time: 06/23/20  6:30 PM  Result Value Ref Range   Phosphorus 3.1 2.5 - 4.6 mg/dL    Comment: Performed at [facility], [address] [place], [place] [address]  Vitamin B12     Status: Abnormal   Collection Time: 06/23/20  9:53 PM  Result Value Ref Range   Vitamin B-12 168 (L) 180 - 914 pg/mL    Comment: (NOTE) This assay is not validated for testing neonatal or myeloproliferative syndrome specimens for Vitamin B12 levels. Performed at [facility], [address] [place], [place] [address]   Folate     Status: Abnormal   Collection Time: 06/23/20  9:53 PM  Result Value Ref Range   Folate 4.7 (L) >5.9 ng/mL    Comment: Performed at [facility], [address] [place], [place] [address]  Iron and TIBC     Status: Abnormal   Collection Time: 06/23/20  9:53 PM  Result Value Ref Range   Iron 44 (L) 45 - 182 ug/dL   TIBC 024 097 - 353 ug/dL   Saturation Ratios 17 (L) 17.9 - 39.5 %   UIBC 219 ug/dL    Comment: Performed at [facility], [address] [place], [place] [address]  Ferritin     Status: None   Collection Time: 06/23/20  9:53 PM  Result Value Ref Range   Ferritin 249 24 - 336 ng/mL    Comment: Performed at [facility], [address] [place], [place] [address]  Reticulocytes     Status: Abnormal   Collection Time: 06/23/20  9:53 PM  Result Value Ref Range   Retic Ct Pct 4.2 (H) 0.4 - 3.1 %   RBC. 2.57 (L) 4.22 - 5.81 MIL/uL   Retic Count, Absolute 106.9 19.0 - 186.0 K/uL   Immature Retic Fract 23.5 (H) 2.3 - 15.9 %    Comment: Performed at [facility], [address] [place], [place] [address]  Comprehensive metabolic panel      Status: Abnormal   Collection Time: 06/24/20  3:52 AM  Result Value Ref Range   Sodium 140 135 - 145 mmol/L   Potassium 3.1 (L) 3.5 - 5.1 mmol/L   Chloride 96 (L) 98 - 111 mmol/L   CO2 32 22 - 32 mmol/L   Glucose, Bld 124 (H) 70 - 99 mg/dL    Comment: Glucose reference range applies only to samples taken after fasting for at least 8 hours.   BUN 20 6 - 20 mg/dL   Creatinine, Ser 2.97 (H) 0.61 - 1.24 mg/dL   Calcium 8.5 (L) 8.9 - 10.3 mg/dL   Total Protein 7.1 6.5 - 8.1 g/dL   Albumin 3.2 (L) 3.5 - 5.0 g/dL   AST 15 15 - 41 U/L   ALT 7 0 - 44 U/L   Alkaline Phosphatase 74 38 - 126 U/L   Total Bilirubin  0.8 0.3 - 1.2 mg/dL   GFR, Estimated 60 (L) >60 mL/min    Comment: (NOTE) Calculated using the CKD-EPI Creatinine Equation (2021)    Anion gap 12 5 - 15    Comment: Performed at [facility], [address] [place], [place] [address]  Magnesium     Status: None   Collection Time: 06/24/20  3:52 AM  Result Value Ref Range   Magnesium 2.2 1.7 - 2.4 mg/dL    Comment: Performed at [facility], [address] [place], [place] [address]  CBC     Status: Abnormal   Collection Time: 06/24/20  3:52 AM  Result Value Ref Range   WBC 12.3 (H) 4.0 - 10.5 K/uL   RBC 2.66 (L) 4.22 - 5.81 MIL/uL   Hemoglobin 10.7 (L) 13.0 - 17.0 g/dL   HCT 09.8 (L) 11.9 - 14.7 %   MCV 124.1 (H) 80.0 - 100.0 fL   MCH 40.2 (H) 26.0 - 34.0 pg   MCHC 32.4 30.0 - 36.0 g/dL   RDW 82.9 (H) 56.2 - 13.0 %   Platelets 211 150 - 400 K/uL   nRBC 0.2 0.0 - 0.2 %    Comment: Performed at [facility], [address], [place], [place] [address]  Resp Panel by RT-PCR (Flu A&B, Covid) Nasopharyngeal Swab     Status: None   Collection Time: 06/24/20  5:44 AM   Specimen: Nasopharyngeal Swab; Nasopharyngeal(NP) swabs in vial transport medium  Result Value Ref Range   SARS Coronavirus 2 by RT PCR NEGATIVE NEGATIVE    Comment: (NOTE) SARS-CoV-2 target nucleic acids are NOT DETECTED.  The  SARS-CoV-2 RNA is generally detectable in upper respiratory specimens during the acute phase of infection. The lowest concentration of SARS-CoV-2 viral copies this assay can detect is 138 copies/mL. A negative result does not preclude SARS-Cov-2 infection and should not be used as the sole basis for treatment or other patient management decisions. A negative result may occur with  improper specimen collection/handling, submission of specimen other than nasopharyngeal swab, presence of viral mutation(s) within the areas targeted by this assay, and inadequate number of viral copies(<138 copies/mL). A negative result must be combined with clinical observations, patient history, and epidemiological information. The expected result is Negative.  Fact Sheet for Patients:  BloggerCourse.com  Fact Sheet for Healthcare Providers:  SeriousBroker.it  This test is no t yet approved or cleared by the [place] FDA and  has been authorized for detection and/or diagnosis of SARS-CoV-2 by FDA under an Emergency Use Authorization (EUA). This EUA will remain  in effect (meaning this test can be used) for the duration of the COVID-19 declaration under Section 564(b)(1) of the Act, 21 U.S.C.section 360bbb-3(b)(1), unless the authorization is terminated  or revoked sooner.       Influenza A by PCR NEGATIVE NEGATIVE   Influenza B by PCR NEGATIVE NEGATIVE    Comment: (NOTE) The Xpert Xpress SARS-CoV-2/FLU/RSV plus assay is intended as an aid in the diagnosis of influenza from Nasopharyngeal swab specimens and should not be used as a sole basis for treatment. Nasal washings and aspirates are unacceptable for Xpert Xpress SARS-CoV-2/FLU/RSV testing.  Fact Sheet for Patients: BloggerCourse.com  Fact Sheet for Healthcare Providers: SeriousBroker.it  This test is not yet approved or cleared by the  [place] FDA and has been authorized for detection and/or diagnosis of SARS-CoV-2 by FDA under an Emergency Use Authorization (EUA). This EUA will remain in effect (meaning this test  can be used) for the duration of the COVID-19 declaration under Section 564(b)(1) of the Act, 21 U.S.C. section 360bbb-3(b)(1), unless the authorization is terminated or revoked.  Performed at [facility], [address] [place], [place] [address]   Basic metabolic panel     Status: Abnormal   Collection Time: 06/24/20  1:55 PM  Result Value Ref Range   Sodium 138 135 - 145 mmol/L   Potassium 3.4 (L) 3.5 - 5.1 mmol/L   Chloride 96 (L) 98 - 111 mmol/L   CO2 31 22 - 32 mmol/L   Glucose, Bld 122 (H) 70 - 99 mg/dL    Comment: Glucose reference range applies only to samples taken after fasting for at least 8 hours.   BUN 20 6 - 20 mg/dL   Creatinine, Ser 7.62 0.61 - 1.24 mg/dL   Calcium 8.4 (L) 8.9 - 10.3 mg/dL   GFR, Estimated >26 >33 mL/min    Comment: (NOTE) Calculated using the CKD-EPI Creatinine Equation (2021)    Anion gap 11 5 - 15    Comment: Performed at [facility], [address] [place], [place] [address]  Basic metabolic panel     Status: Abnormal   Collection Time: 06/25/20  3:00 AM  Result Value Ref Range   Sodium 139 135 - 145 mmol/L   Potassium 4.5 3.5 - 5.1 mmol/L   Chloride 97 (L) 98 - 111 mmol/L   CO2 32 22 - 32 mmol/L   Glucose, Bld 92 70 - 99 mg/dL    Comment: Glucose reference range applies only to samples taken after fasting for at least 8 hours.   BUN 22 (H) 6 - 20 mg/dL   Creatinine, Ser 2.56 (H) 0.61 - 1.24 mg/dL   Calcium 8.5 (L) 8.9 - 10.3 mg/dL   GFR, Estimated >38 >93 mL/min    Comment: (NOTE) Calculated using the CKD-EPI Creatinine Equation (2021)    Anion gap 10 5 - 15    Comment: Performed at [facility], [address] [place], [place] [address]  CBC     Status: Abnormal   Collection Time: 06/25/20  3:00 AM  Result  Value Ref Range   WBC 9.4 4.0 - 10.5 K/uL   RBC 2.35 (L) 4.22 - 5.81 MIL/uL   Hemoglobin 9.6 (L) 13.0 - 17.0 g/dL   HCT 76.8 (L) 11.5 - 72.6 %   MCV 124.3 (H) 80.0 - 100.0 fL   MCH 40.9 (H) 26.0 - 34.0 pg   MCHC 32.9 30.0 - 36.0 g/dL   RDW 20.3 (H) 55.9 - 74.1 %   Platelets 178 150 - 400 K/uL   nRBC 0.0 0.0 - 0.2 %    Comment: Performed at [facility], [address] [place], [place] [address]  Basic metabolic panel     Status: Abnormal   Collection Time: 06/26/20  6:06 AM  Result Value Ref Range   Sodium 138 135 - 145 mmol/L   Potassium 4.4 3.5 - 5.1 mmol/L   Chloride 99 98 - 111 mmol/L   CO2 27 22 - 32 mmol/L   Glucose, Bld 76 70 - 99 mg/dL    Comment: Glucose reference range applies only to samples taken after fasting for at least 8 hours.   BUN 25 (H) 6 - 20 mg/dL   Creatinine, Ser 3.64 0.61 - 1.24 mg/dL   Calcium 8.7 (L) 8.9 - 10.3 mg/dL   GFR, Estimated >68 >03 mL/min    Comment: (NOTE) Calculated  using the CKD-EPI Creatinine Equation (2021)    Anion gap 12 5 - 15    Comment: Performed at [facility], [address] [place], [place] [address]  CBC     Status: Abnormal   Collection Time: 06/26/20  6:06 AM  Result Value Ref Range   WBC 14.9 (H) 4.0 - 10.5 K/uL   RBC 2.45 (L) 4.22 - 5.81 MIL/uL   Hemoglobin 9.9 (L) 13.0 - 17.0 g/dL   HCT 22.3 (L) 36.1 - 22.4 %   MCV 126.1 (H) 80.0 - 100.0 fL   MCH 40.4 (H) 26.0 - 34.0 pg   MCHC 32.0 30.0 - 36.0 g/dL   RDW 49.7 (H) 53.0 - 05.1 %   Platelets 178 150 - 400 K/uL   nRBC 0.3 (H) 0.0 - 0.2 %    Comment: Performed at [facility], [address], [place], [place] [address]  Basic metabolic panel     Status: Abnormal   Collection Time: 06/27/20  3:43 AM  Result Value Ref Range   Sodium 140 135 - 145 mmol/L   Potassium 4.3 3.5 - 5.1 mmol/L   Chloride 98 98 - 111 mmol/L   CO2 32 22 - 32 mmol/L   Glucose, Bld 118 (H) 70 - 99 mg/dL    Comment: Glucose reference range applies only to samples  taken after fasting for at least 8 hours.   BUN 31 (H) 6 - 20 mg/dL   Creatinine, Ser 1.73 (H) 0.61 - 1.24 mg/dL   Calcium 8.2 (L) 8.9 - 10.3 mg/dL   GFR, Estimated 59 (L) >60 mL/min    Comment: (NOTE) Calculated using the CKD-EPI Creatinine Equation (2021)    Anion gap 10 5 - 15    Comment: Performed at [facility], [address] [place], [place] [address]  CBC     Status: Abnormal   Collection Time: 06/27/20  3:43 AM  Result Value Ref Range   WBC 12.0 (H) 4.0 - 10.5 K/uL   RBC 2.32 (L) 4.22 - 5.81 MIL/uL   Hemoglobin 9.3 (L) 13.0 - 17.0 g/dL   HCT 41.0 (L) 30.1 - 31.4 %   MCV 124.1 (H) 80.0 - 100.0 fL   MCH 40.1 (H) 26.0 - 34.0 pg   MCHC 32.3 30.0 - 36.0 g/dL   RDW 38.8 (H) 87.5 - 79.7 %   Platelets 169 150 - 400 K/uL   nRBC 0.7 (H) 0.0 - 0.2 %    Comment: Performed at [facility], [address] [place], [place] [address]  Brain natriuretic peptide     Status: Abnormal   Collection Time: 06/27/20  3:43 AM  Result Value Ref Range   B Natriuretic Peptide 3,221.6 (H) 0.0 - 100.0 pg/mL    Comment: Performed at [facility], [address] [place], [place] [address]  Basic metabolic panel     Status: Abnormal   Collection Time: 06/28/20  7:26 AM  Result Value Ref Range   Sodium 138 135 - 145 mmol/L   Potassium 4.7 3.5 - 5.1 mmol/L   Chloride 96 (L) 98 - 111 mmol/L   CO2 32 22 - 32 mmol/L   Glucose, Bld 105 (H) 70 - 99 mg/dL    Comment: Glucose reference range applies only to samples taken after fasting for at least 8 hours.   BUN 25 (H) 6 - 20 mg/dL   Creatinine, Ser 5.37 0.61 - 1.24 mg/dL   Calcium 8.6 (L) 8.9 - 10.3  mg/dL   GFR, Estimated >60 >60 mL/min    Comment: (NOTE) Calculated using the CKD-EPI Creatinine Equation (2021)    Anion gap 10 5 - 15    Comment: Performed at [facility], [address] [place], [place] [address]  Uric acid     Status: Abnormal   Collection Time: 06/28/20  7:26 AM  Result Value Ref Range    Uric Acid, Serum 10.1 (H) 3.7 - 8.6 mg/dL    Comment: Performed at [facility], [address] [place],  [address]    Radiology PERIPHERAL VASCULAR CATHETERIZATION  Result Date: 06/25/2020 See op note  DG Chest Port 1 View  Result Date: 06/26/2020 CLINICAL DATA:  Shortness of breath. EXAM: PORTABLE CHEST 1 VIEW COMPARISON:  One-view chest x-ray 02/17/2020 FINDINGS: Heart is enlarged. Mild to moderate pulmonary vascular congestion is present. No significant edema is present. Mild atelectasis is present at the bases. IMPRESSION: Cardiomegaly and mild to moderate pulmonary vascular congestion without frank edema. Electronically Signed   By: San Morelle M.D.   On: 06/26/2020 13:49   VAS [place] ABI WITH/WO TBI  Result Date: 06/19/2020 LOWER EXTREMITY DOPPLER STUDY Indications: Left foot cold.  Performing Technologist: Concha Norway RVT  Examination Guidelines: A complete evaluation includes at minimum, Doppler waveform signals and systolic blood pressure reading at the level of bilateral brachial, anterior tibial, and posterior tibial arteries, when vessel segments are accessible. Bilateral testing is considered an integral part of a complete examination. Photoelectric Plethysmograph (PPG) waveforms and toe systolic pressure readings are included as required and additional duplex testing as needed. Limited examinations for reoccurring indications may be performed as noted.  ABI Findings: +---------+------------------+-----+---------+--------+ Right    Rt Pressure (mmHg)IndexWaveform Comment  +---------+------------------+-----+---------+--------+ Brachial 118                                      +---------+------------------+-----+---------+--------+ ATA      145               1.23 triphasic         +---------+------------------+-----+---------+--------+ PTA      129               1.09 biphasic           +---------+------------------+-----+---------+--------+ Great Toe83                0.70 Abnormal          +---------+------------------+-----+---------+--------+ +---------+------------------+-----+----------+-------+ Left     Lt Pressure (mmHg)IndexWaveform  Comment +---------+------------------+-----+----------+-------+ ATA      103               0.87 monophasic        +---------+------------------+-----+----------+-------+ PTA      118               1.00 monophasic        +---------+------------------+-----+----------+-------+ Great Toe49                0.42 Abnormal          +---------+------------------+-----+----------+-------+  Summary: Right: Resting right ankle-brachial index is within normal range. No evidence of significant right lower extremity arterial disease. The right toe-brachial index is normal. Left: Resting left ankle-brachial index is within normal range. No evidence of significant left lower extremity arterial disease. The left toe-brachial index is abnormal. Although ankle brachial indices are within normal limits (0.95-1.29), arterial Doppler waveforms at  the ankle suggest some component of arterial occlusive disease.  *See table(s) above for measurements and observations.  Electronically signed by Festus Barren MD on 06/19/2020 at 11:30:05 AM.   Final     Assessment/Plan   Acute on chronic systolic CHF (congestive heart failure) (HCC) His chronic congestive heart failure with an ejection fraction of 25% is certainly not helping his lower extremity perfusion.  Is also a major cause of his leg swelling.  Hyperlipidemia lipid control important in reducing the progression of atherosclerotic disease. Continue statin therapy   Bilateral leg edema Multifactorial but poor cardiac function as a primary cause.  Compression and elevation important to minimize the swelling.  He is on diuretics as well.  Essential hypertension blood pressure control  important in reducing the progression of atherosclerotic disease. On appropriate oral medications.   Atherosclerosis of native arteries of extremity with rest pain (HCC) Noninvasive studies today show strong biphasic waveforms in the left lower extremity now with an ABI of 1.09.  His right ABI 0.7. Overall he is doing well.  No right leg symptoms at this time.  Continue current medical regimen.  Recheck in 3 months    Festus Barren, MD  07/10/2020 10:43 AM    This note was created with Dragon medical transcription system.  Any errors from dictation are purely unintentional

## 2020-07-16 NOTE — Progress Notes (Deleted)
   Patient ID: BLEASE CAPALDI, male    DOB: 09/14/1960, 60 y.o.   MRN: [id number]  HPI  Mr Connaughton is a 60 y/o male with a history of  Echo report from 02/17/20 reviewed and showed an EF of 20-25% along with moderately elevated PA pressure and mild/ moderate MR.   LHC done 06/17/16 and showed:  Prox Cx to Mid Cx lesion, 100 %stenosed.  Ost LAD to Mid LAD lesion, 100 %stenosed.  Ost 1st Diag to 1st Diag lesion, 100 %stenosed.  Ost RCA to Prox RCA lesion, 60 %stenosed.  Mid RCA lesion, 50 %stenosed.  Dist RCA lesion, 75 %stenosed.  There is moderate to severe left ventricular systolic dysfunction.  LV end diastolic pressure is mildly elevated.  The left ventricular ejection fraction is 25-35% by visual estimate.  Admitted 06/23/20 due to acute on chronic HF. Initially given IV lasix and then transitioned to oral diuretics. Needed oxygen at 2L. Vascular consulted and had 3 angioplasties and 2 stents placed for the left foot. PT eval completed. Discharged after 6 days.   He presents today for his initial visit with a chief complaint   Review of Systems    Physical Exam  Assessment & Plan:  1: Chronic heart failure with reduced ejection fraction- - NYHA class  2: HTN- - BP  3: Tobacco use-  4: PVD-

## 2020-07-17 ENCOUNTER — Ambulatory Visit: Payer: Commercial Managed Care - PPO | Admitting: Family

## 2020-07-17 ENCOUNTER — Telehealth: Payer: Self-pay | Admitting: Family

## 2020-07-17 NOTE — Telephone Encounter (Signed)
Patient did not show for his [facility] appointment on 07/17/20. Will attempt to reschedule.

## 2020-07-18 ENCOUNTER — Emergency Department: Payer: Commercial Managed Care - PPO

## 2020-07-18 ENCOUNTER — Emergency Department
Admission: EM | Admit: 2020-07-18 | Discharge: 2020-07-18 | Disposition: A | Discharge status: Home / Self Care | Payer: Commercial Managed Care - PPO | Attending: Emergency Medicine | Admitting: Emergency Medicine

## 2020-07-18 ENCOUNTER — Encounter: Payer: Self-pay | Admitting: Emergency Medicine

## 2020-07-18 ENCOUNTER — Other Ambulatory Visit: Payer: Self-pay

## 2020-07-18 DIAGNOSIS — I5023 Acute on chronic systolic (congestive) heart failure: Secondary | ICD-10-CM | POA: Diagnosis not present

## 2020-07-18 DIAGNOSIS — Z955 Presence of coronary angioplasty implant and graft: Secondary | ICD-10-CM | POA: Diagnosis not present

## 2020-07-18 DIAGNOSIS — I130 Hypertensive heart and chronic kidney disease with heart failure and stage 1 through stage 4 chronic kidney disease, or unspecified chronic kidney disease: Secondary | ICD-10-CM | POA: Diagnosis not present

## 2020-07-18 DIAGNOSIS — Z7901 Long term (current) use of anticoagulants: Secondary | ICD-10-CM | POA: Insufficient documentation

## 2020-07-18 DIAGNOSIS — Z79899 Other long term (current) drug therapy: Secondary | ICD-10-CM | POA: Diagnosis not present

## 2020-07-18 DIAGNOSIS — R600 Localized edema: Secondary | ICD-10-CM | POA: Insufficient documentation

## 2020-07-18 DIAGNOSIS — M79604 Pain in right leg: Secondary | ICD-10-CM

## 2020-07-18 DIAGNOSIS — N182 Chronic kidney disease, stage 2 (mild): Secondary | ICD-10-CM | POA: Diagnosis not present

## 2020-07-18 DIAGNOSIS — F17210 Nicotine dependence, cigarettes, uncomplicated: Secondary | ICD-10-CM | POA: Diagnosis not present

## 2020-07-18 DIAGNOSIS — Z20822 Contact with and (suspected) exposure to covid-19: Secondary | ICD-10-CM | POA: Diagnosis not present

## 2020-07-18 DIAGNOSIS — R609 Edema, unspecified: Secondary | ICD-10-CM

## 2020-07-18 DIAGNOSIS — M7989 Other specified soft tissue disorders: Secondary | ICD-10-CM | POA: Diagnosis present

## 2020-07-18 DIAGNOSIS — I2510 Atherosclerotic heart disease of native coronary artery without angina pectoris: Secondary | ICD-10-CM | POA: Diagnosis not present

## 2020-07-18 LAB — COMPREHENSIVE METABOLIC PANEL
ALT: 9 U/L (ref 0–44)
AST: 17 U/L (ref 15–41)
Albumin: 3.3 g/dL — ABNORMAL LOW (ref 3.5–5.0)
Alkaline Phosphatase: 78 U/L (ref 38–126)
Anion gap: 15 (ref 5–15)
BUN: 20 mg/dL (ref 6–20)
CO2: 23 mmol/L (ref 22–32)
Calcium: 8.9 mg/dL (ref 8.9–10.3)
Chloride: 98 mmol/L (ref 98–111)
Creatinine, Ser: 1.33 mg/dL — ABNORMAL HIGH (ref 0.61–1.24)
GFR, Estimated: >60 mL/min (ref >60)
Glucose, Bld: 104 mg/dL — ABNORMAL HIGH (ref 70–99)
Potassium: 3.8 mmol/L (ref 3.5–5.1)
Sodium: 136 mmol/L (ref 135–145)
Total Bilirubin: 1.8 mg/dL — ABNORMAL HIGH (ref 0.3–1.2)
Total Protein: 7.9 g/dL (ref 6.5–8.1)

## 2020-07-18 LAB — RESP PANEL BY RT-PCR (FLU A&B, COVID) ARPGX2
Influenza A by PCR: NEGATIVE
Influenza B by PCR: NEGATIVE
SARS Coronavirus 2 by RT PCR: NEGATIVE

## 2020-07-18 LAB — CBC WITH DIFFERENTIAL/PLATELET
Abs Immature Granulocytes: 0.1 10*3/uL — ABNORMAL HIGH (ref 0.00–0.07)
Basophils Absolute: 0.1 10*3/uL (ref 0.0–0.1)
Basophils Relative: 0 %
Eosinophils Absolute: 0 10*3/uL (ref 0.0–0.5)
Eosinophils Relative: 0 %
HCT: 34 % — ABNORMAL LOW (ref 39.0–52.0)
Hemoglobin: 10.8 g/dL — ABNORMAL LOW (ref 13.0–17.0)
Immature Granulocytes: 1 %
Lymphocytes Relative: 7 %
Lymphs Abs: 0.9 10*3/uL (ref 0.7–4.0)
MCH: 39 pg — ABNORMAL HIGH (ref 26.0–34.0)
MCHC: 31.8 g/dL (ref 30.0–36.0)
MCV: 122.7 fL — ABNORMAL HIGH (ref 80.0–100.0)
Monocytes Absolute: 1 10*3/uL (ref 0.1–1.0)
Monocytes Relative: 7 %
Neutro Abs: 11.8 10*3/uL — ABNORMAL HIGH (ref 1.7–7.7)
Neutrophils Relative %: 85 %
Platelets: 145 10*3/uL — ABNORMAL LOW (ref 150–400)
RBC: 2.77 MIL/uL — ABNORMAL LOW (ref 4.22–5.81)
RDW: 16.6 % — ABNORMAL HIGH (ref 11.5–15.5)
Smear Review: NORMAL
WBC: 14 10*3/uL — ABNORMAL HIGH (ref 4.0–10.5)
nRBC: 0.4 % — ABNORMAL HIGH (ref 0.0–0.2)

## 2020-07-18 LAB — POC SARS CORONAVIRUS 2 AG -  ED: SARS Coronavirus 2 Ag: NEGATIVE

## 2020-07-18 MED ORDER — ACETAMINOPHEN 500 MG PO TABS
1000.0000 mg | ORAL_TABLET | Freq: Once | ORAL | Status: AC
  Administered 2020-07-18: 1000 mg via ORAL
  Filled 2020-07-18: qty 2

## 2020-07-18 NOTE — Discharge Instructions (Signed)
As we discussed, I would recommend that you get some compression socks from a pharmacy. Elevate your legs to reduce the swelling.  Please take Tylenol and ibuprofen/Advil for your pain.  It is safe to take them together, or to alternate them every few hours.  Take up to 1000mg  of Tylenol at a time, up to 4 times per day.  Do not take more than 4000 mg of Tylenol in 24 hours.  For ibuprofen, take 400-600 mg, 4-5 times per day.  Please follow-up with your vascular surgeon.  Call [facility] [facility] to be established with a primary care physician.  If you feel unsafe at home, have falls, or worsening pain, please return to the ED.

## 2020-07-18 NOTE — ED Provider Notes (Signed)
[facility] Emergency Department Provider Note ____________________________________________   Event Date/Time   First MD Initiated Contact with Patient 07/18/20 1615     (approximate)  I have reviewed the triage vital signs and the nursing notes.  HISTORY  Chief Complaint Leg Swelling   HPI Cameron [name] is a 60 y.o. malewho presents to the ED for evaluation of left leg swelling.  Chart review indicates history of CAD s/p DES to LAD.  HTN, HLD, PAD and CHF with reduced ejection fraction.  On Eliquis and Lasix. Required left leg revascularization with Dr. Wyn Quaker on 06/25/2020.  Patient reports feeling weak with a poor appetite for the past 1 week, such that he has not been ambulating at his baseline due to "being laid up."  Patient reports increased swelling, primarily to his left leg, over the past 5-6 days with associated aching discomfort.  He reports sensation is similar to what it has been after his recent procedure, improved from previous to that.  Patient was living home alone, and his neighbor who frequently checks on him, urged him to come to the ED due to his symptoms of pain after recently having a procedure. Patient denies recent falls, trauma or injuries.  Noted to have a temperature of 100.1 F in triage, patient denies recent fevers or chills.  Denies shortness of breath, cough, orthopnea.  Patient reports his breathing is at his baseline.  Past Medical History:  Diagnosis Date  . CHF (congestive heart failure) (HCC)   . Coronary artery disease    a. 12/17 PCI with DES to pLAD with resdiual disease (RX therapy)  . Hyperlipidemia   . Hypertension   . PAD (peripheral artery disease) (HCC)   . STEMI (ST elevation myocardial infarction) [facility]) 2017    Patient Active Problem List   Diagnosis Date Noted  . CKD (chronic kidney disease), stage II   . Hypotension   . Acute on chronic respiratory failure with hypoxia (HCC)   . PVD (peripheral  vascular disease) (HCC)   . AKI (acute kidney injury) (HCC)   . Macrocytic anemia   . Atherosclerosis of native arteries of extremity with rest pain (HCC) 06/19/2020  . Acute on chronic systolic CHF (congestive heart failure) (HCC) 02/17/2020  . Hypokalemia 02/17/2020  . Hypomagnesemia 02/17/2020  . Elevated troponin 02/17/2020  . New onset atrial fibrillation (HCC) 02/17/2020  . Olecranon bursitis of left elbow 02/17/2020  . NSVT (nonsustained ventricular tachycardia) (HCC) 02/17/2020  . Tobacco abuse 02/17/2020  . Apical mural thrombus 02/17/2020  . Gout_left elbow 02/17/2020  . Bilateral leg edema 10/19/2018  . Weight gain 10/19/2018  . Chest pain 02/02/2018  . Acute pain of right knee 03/13/2017  . Essential hypertension 06/20/2016  . Hyperlipidemia 06/20/2016  . CAD (coronary artery disease), native coronary artery 06/20/2016  . STEMI involving oth coronary artery of anterior wall (HCC) 06/17/2016  . STEMI (ST elevation myocardial infarction) (HCC) 06/17/2016    Past Surgical History:  Procedure Laterality Date  . CARDIAC CATHETERIZATION N/A 06/17/2016   Procedure: Left Heart Cath and Coronary Angiography;  Surgeon: Runell Gess, MD;  Location: [facility] INVASIVE CV LAB;  Service: Cardiovascular;  Laterality: N/A;  . CARDIAC CATHETERIZATION N/A 06/17/2016   Procedure: Coronary Stent Intervention;  Surgeon: Runell Gess, MD;  Location: [facility] INVASIVE CV LAB;  Service: Cardiovascular;  Laterality: N/A;  . cardiac stents    . CERVICAL SPINE SURGERY    . LOWER EXTREMITY ANGIOGRAPHY Left 06/25/2020   Procedure:  LOWER EXTREMITY ANGIOGRAPHY;  Surgeon: Annice Needy, MD;  Location: [facility] INVASIVE CV LAB;  Service: Cardiovascular;  Laterality: Left;    Prior to Admission medications   Medication Sig Start Date End Date Taking? Authorizing Provider  apixaban (ELIQUIS) 5 MG TABS tablet Take 1 tablet (5 mg total) by mouth 2 (two) times daily. 06/29/20 07/29/20  Alford Highland, MD   atorvastatin (LIPITOR) 80 MG tablet Take 1 tablet (80 mg total) by mouth daily. 02/21/20   Charise Killian, MD  carvedilol (COREG) 3.125 MG tablet Take 1 tablet (3.125 mg total) by mouth 2 (two) times daily with a meal. 06/29/20   Alford Highland, MD  colchicine 0.6 MG tablet Take 1 tablet (0.6 mg total) by mouth daily as needed. 06/29/20 07/29/20  Alford Highland, MD  ferrous sulfate 325 (65 FE) MG tablet Take 1 tablet (325 mg total) by mouth daily with breakfast. 06/29/20   Alford Highland, MD  furosemide (LASIX) 40 MG tablet Take 0.5 tablets (20 mg total) by mouth 2 (two) times daily. 06/29/20 07/29/20  Alford Highland, MD  HYDROcodone-acetaminophen (NORCO/VICODIN) 5-325 MG tablet Take 1 tablet by mouth every 4 (four) hours as needed for moderate pain. 06/29/20   Alford Highland, MD  nitroGLYCERIN (NITROSTAT) 0.4 MG SL tablet Place 1 tablet (0.4 mg total) under the tongue every 5 (five) minutes as needed. 06/20/16   Arty Baumgartner, NP  polyethylene glycol (MIRALAX / GLYCOLAX) 17 g packet Take 17 g by mouth daily as needed for moderate constipation. Patient not taking: Reported on 07/10/2020 06/29/20   Alford Highland, MD  potassium chloride (KLOR-CON) 10 MEQ tablet Take 1 tablet (10 mEq total) by mouth daily. 06/29/20 07/29/20  Alford Highland, MD  VENTOLIN HFA 108 (90 Base) MCG/ACT inhaler Inhale 2 puffs into the lungs every 6 (six) hours as needed for wheezing or shortness of breath. Patient not taking: Reported on 07/10/2020 06/29/20   Alford Highland, MD  vitamin B-12 1000 MCG tablet Take 1 tablet (1,000 mcg total) by mouth daily. 06/30/20   Alford Highland, MD  isosorbide mononitrate (IMDUR) 60 MG 24 hr tablet Take 1 tablet (60 mg total) by mouth daily. 02/04/18 05/30/19  [name], MD    Allergies Patient has no known allergies.  Family History  Problem Relation Age of Onset  . CAD Paternal Grandfather   . Breast cancer Mother   . Parkinson's disease Father   . Alzheimer's  disease Father   . Hypertension Father   . Diabetes Father     Social History Social History   Tobacco Use  . Smoking status: Current Every Day Smoker    Packs/day: 1.00    Types: Cigarettes  . Smokeless tobacco: Never Used  Vaping Use  . Vaping Use: Never used  Substance Use Topics  . Alcohol use: Yes    Comment: weekends  . Drug use: Yes    Frequency: 1.0 times per week    Types: Marijuana    Comment: twice monthly    Review of Systems  Constitutional: No fever/chills.  Positive generalized weakness. Eyes: No visual changes. ENT: No sore throat. Cardiovascular: Denies chest pain. Respiratory: Denies shortness of breath. Gastrointestinal: No abdominal pain.  No nausea, no vomiting.  No diarrhea.  No constipation. Genitourinary: Negative for dysuria. Musculoskeletal: Negative for back pain.  Positive for lower extremity edema and aching discomfort to his left leg. Skin: Negative for rash. Neurological: Negative for headaches, focal weakness or numbness.  ____________________________________________   PHYSICAL EXAM:  VITAL  SIGNS: Vitals:   07/18/20 1800 07/18/20 1830  BP: 105/73 109/72  Pulse: 80 77  Resp:  16  Temp:    SpO2: 95% 97%     Constitutional: Alert and oriented. Well appearing and in no acute distress. Eyes: Conjunctivae are normal. PERRL. EOMI. Head: Atraumatic. Nose: No congestion/rhinnorhea. Mouth/Throat: Mucous membranes are moist.  Oropharynx non-erythematous. Neck: No stridor. No cervical spine tenderness to palpation. Cardiovascular: Normal rate, regular rhythm. Grossly normal heart sounds.  Good peripheral circulation. Respiratory: Normal respiratory effort.  No retractions. Lungs CTAB. Gastrointestinal: Soft , nondistended, nontender to palpation. No CVA tenderness. Musculoskeletal:  No joint effusions. No signs of acute trauma. Pitting edema symmetrically to bilateral lower extremities to above the knees.  No overlying skin changes or  signs of trauma. Able to Doppler DP and PT pulses on the left.  Capillary refill about 2 seconds. Neurologic:  Normal speech and language. No gross focal neurologic deficits are appreciated. No gait instability noted. Skin:  Skin is warm, dry and intact. No rash noted. Psychiatric: Mood and affect are normal. Speech and behavior are normal.  ____________________________________________   LABS (all labs ordered are listed, but only abnormal results are displayed)  Labs Reviewed  COMPREHENSIVE METABOLIC PANEL - Abnormal; Notable for the following components:      Result Value   Glucose, Bld 104 (*)    Creatinine, Ser 1.33 (*)    Albumin 3.3 (*)    Total Bilirubin 1.8 (*)    All other components within normal limits  CBC WITH DIFFERENTIAL/PLATELET - Abnormal; Notable for the following components:   WBC 14.0 (*)    RBC 2.77 (*)    Hemoglobin 10.8 (*)    HCT 34.0 (*)    MCV 122.7 (*)    MCH 39.0 (*)    RDW 16.6 (*)    Platelets 145 (*)    nRBC 0.4 (*)    Neutro Abs 11.8 (*)    Abs Immature Granulocytes 0.10 (*)    All other components within normal limits  RESP PANEL BY RT-PCR (FLU A&B, COVID) ARPGX2  POC SARS CORONAVIRUS 2 AG -  ED   ____________________________________________  RADIOLOGY  ED MD interpretation: Reviewed CXR reviewed by me with cardiomegaly and pulmonary vascular congestion without infiltrate or pleural effusion  Official radiology report(s): US Venous Img Lower Unilateral Left  Result Date: 07/18/2020 CLINICAL DATA:  Leg swelling recent vascular surgery EXAM: Left LOWER EXTREMITY VENOUS DOPPLER ULTRASOUND TECHNIQUE: Gray-scale sonography with compression, as well as color and duplex ultrasound, were performed to evaluate the deep venous system(s) from the level of the common femoral vein through the popliteal and proximal calf veins. COMPARISON:  None. FINDINGS: VENOUS Normal compressibility of the common femoral, superficial femoral, and popliteal veins, as  well as the visualized calf veins. Visualized portions of profunda femoral vein and great saphenous vein unremarkable. No filling defects to suggest DVT on grayscale or color Doppler imaging. Doppler waveforms show normal direction of venous flow, normal respiratory plasticity and response to augmentation. Limited views of the contralateral common femoral vein are unremarkable. OTHER Moderate edema within the subcutaneous soft tissues. Limitations: none IMPRESSION: Negative. Electronically Signed   By: Jasmine Pang M.D.   On: 07/18/2020 16:22   DG Chest Portable 1 View  Result Date: 07/18/2020 CLINICAL DATA:  60 year old male with generalized weakness. EXAM: PORTABLE CHEST 1 VIEW COMPARISON:  Chest radiograph dated 06/26/2020. FINDINGS: Cardiomegaly with vascular congestion. No focal consolidation, pleural effusion or pneumothorax. No acute osseous pathology.  IMPRESSION: Cardiomegaly with vascular congestion. No focal consolidation. Electronically Signed   By: Elgie Collard M.D.   On: 07/18/2020 17:11    ____________________________________________   PROCEDURES and INTERVENTIONS  Procedure(s) performed (including Critical Care):  .1-3 Lead EKG Interpretation Performed by: Delton Prairie, MD Authorized by: Delton Prairie, MD     Interpretation: normal     ECG rate:  76   ECG rate assessment: normal     Rhythm: sinus rhythm     Ectopy: none     Conduction: normal      Medications  acetaminophen (TYLENOL) tablet 1,000 mg (1,000 mg Oral Given 07/18/20 1639)    ____________________________________________   MDM / ED COURSE   60 year old male with known PAD and CHF presents to the ED with acute on chronic leg swelling and discomfort, without evidence of acute pathology, and amenable to outpatient management. Normal vitals on room air, and a temperature of 100.1 F noted. Blood work shows macrocytic anemia at his baseline without acute pathology. Venous duplex shows no evidence of DVT,  and patient is compliant with his Eliquis. He has brisk capillary refill to his left leg and unable to Doppler pulses to PT and DP on his left leg. He reports feeling generalized poorly over the past few days, I suspect that he is battling a viral syndrome. He tested negative for COVID-19. I believe that due to this, he has been more recumbent and this is causing increasing swelling to his legs, and associated aching discomfort. We discussed continued outpatient management with conservative measures to reduce the swelling and pain. Return precautions for the ED were discussed prior to discharge.   Clinical Course as of 07/18/20 1932  Wed Jul 18, 2020  1706 With Zella Ball, ER nurse, in the room with me able to Doppler PT and DP pulses on the left foot. [DS]  1919 After a strapping his legs, patient was able to get up with the nurse and walk with a walker, albeit slowly.  Long discussion the bedside with the patient about continued outpatient management versus boarding in the ED for PT evaluation in the morning for possible SNF placement thereafter.  He reports that he can rely upon his neighbors for assistance and requests discharge.  We thoroughly discussed return precautions for the ED. [DS]    Clinical Course User Index [DS] Delton Prairie, MD    ____________________________________________   FINAL CLINICAL IMPRESSION(S) / ED DIAGNOSES  Final diagnoses:  Peripheral edema  Pain in both lower extremities     ED Discharge Orders    None       Shavanna Furnari Katrinka Blazing   Note:  This document was prepared using Dragon voice recognition software and may include unintentional dictation errors.   Delton Prairie, MD 07/18/20 Barry Brunner

## 2020-07-18 NOTE — ED Triage Notes (Signed)
Pt comes into the ED via POV c/o left leg swelling that started last week.  Pt states he has a throbbing discomfort in the leg at this time.  Pt recently had a vascular procedure completed with Dr. Wyn Quaker back in December.  Pt non-ambulatory on the leg at this time, but in NAD with even and unlabored respirations. Pt presents with pitting edema.

## 2020-07-18 NOTE — ED Notes (Signed)
Ace wraps applied to bilateral legs to help reduce swelling, patient ambulated with walker, MD at bedside to update on plan of care.

## 2020-07-18 NOTE — ED Notes (Signed)
Pt in US

## 2020-07-23 ENCOUNTER — Other Ambulatory Visit: Payer: Self-pay

## 2020-07-23 DIAGNOSIS — F17210 Nicotine dependence, cigarettes, uncomplicated: Secondary | ICD-10-CM | POA: Diagnosis not present

## 2020-07-23 DIAGNOSIS — Z7901 Long term (current) use of anticoagulants: Secondary | ICD-10-CM | POA: Diagnosis not present

## 2020-07-23 DIAGNOSIS — Z20822 Contact with and (suspected) exposure to covid-19: Secondary | ICD-10-CM | POA: Insufficient documentation

## 2020-07-23 DIAGNOSIS — Z79899 Other long term (current) drug therapy: Secondary | ICD-10-CM | POA: Insufficient documentation

## 2020-07-23 DIAGNOSIS — N182 Chronic kidney disease, stage 2 (mild): Secondary | ICD-10-CM | POA: Diagnosis not present

## 2020-07-23 DIAGNOSIS — M109 Gout, unspecified: Secondary | ICD-10-CM | POA: Diagnosis not present

## 2020-07-23 DIAGNOSIS — M79604 Pain in right leg: Secondary | ICD-10-CM | POA: Diagnosis present

## 2020-07-23 DIAGNOSIS — I5022 Chronic systolic (congestive) heart failure: Secondary | ICD-10-CM | POA: Diagnosis not present

## 2020-07-23 DIAGNOSIS — I482 Chronic atrial fibrillation, unspecified: Secondary | ICD-10-CM | POA: Insufficient documentation

## 2020-07-23 DIAGNOSIS — I110 Hypertensive heart disease with heart failure: Secondary | ICD-10-CM | POA: Diagnosis not present

## 2020-07-23 DIAGNOSIS — I2510 Atherosclerotic heart disease of native coronary artery without angina pectoris: Secondary | ICD-10-CM | POA: Insufficient documentation

## 2020-07-23 LAB — CBC
HCT: 36.6 % — ABNORMAL LOW (ref 39.0–52.0)
Hemoglobin: 11.8 g/dL — ABNORMAL LOW (ref 13.0–17.0)
MCH: 38.3 pg — ABNORMAL HIGH (ref 26.0–34.0)
MCHC: 32.2 g/dL (ref 30.0–36.0)
MCV: 118.8 fL — ABNORMAL HIGH (ref 80.0–100.0)
Platelets: 205 10*3/uL (ref 150–400)
RBC: 3.08 MIL/uL — ABNORMAL LOW (ref 4.22–5.81)
RDW: 16.2 % — ABNORMAL HIGH (ref 11.5–15.5)
WBC: 14.3 10*3/uL — ABNORMAL HIGH (ref 4.0–10.5)
nRBC: 0 % (ref 0.0–0.2)

## 2020-07-23 LAB — BASIC METABOLIC PANEL
Anion gap: 9 (ref 5–15)
BUN: 22 mg/dL — ABNORMAL HIGH (ref 6–20)
CO2: 28 mmol/L (ref 22–32)
Calcium: 9 mg/dL (ref 8.9–10.3)
Chloride: 99 mmol/L (ref 98–111)
Creatinine, Ser: 1.2 mg/dL (ref 0.61–1.24)
GFR, Estimated: >60 mL/min (ref >60)
Glucose, Bld: 109 mg/dL — ABNORMAL HIGH (ref 70–99)
Potassium: 3.2 mmol/L — ABNORMAL LOW (ref 3.5–5.1)
Sodium: 136 mmol/L (ref 135–145)

## 2020-07-23 LAB — BRAIN NATRIURETIC PEPTIDE: B Natriuretic Peptide: 1973.2 pg/mL — ABNORMAL HIGH (ref 0.0–100.0)

## 2020-07-23 NOTE — ED Triage Notes (Addendum)
PT to ED from home via EMS c/o leg pain and swelling. PT had a stent placed in left leg in December by Dr. Wyn Quaker, non-pitting edema and pain in left leg, 4+ pitting edema and pain (less severe than L) in the right leg.  No CP or SHOB.  Has been seen periodically for this swelling.  Bilateral doppler pedal pulses

## 2020-07-23 NOTE — ED Notes (Signed)
Reviewed pt's cc with Dr Katrinka Blazing; lab order obtained for BNP, no further orders at this time

## 2020-07-24 ENCOUNTER — Emergency Department: Payer: Commercial Managed Care - PPO

## 2020-07-24 ENCOUNTER — Inpatient Hospital Stay: Payer: Commercial Managed Care - PPO

## 2020-07-24 ENCOUNTER — Observation Stay
Admission: EM | Admit: 2020-07-24 | Discharge: 2020-07-26 | Disposition: A | Discharge status: Home / Self Care | Payer: Commercial Managed Care - PPO | Attending: Student | Admitting: Student

## 2020-07-24 DIAGNOSIS — R609 Edema, unspecified: Secondary | ICD-10-CM

## 2020-07-24 DIAGNOSIS — M109 Gout, unspecified: Secondary | ICD-10-CM | POA: Diagnosis not present

## 2020-07-24 DIAGNOSIS — R600 Localized edema: Secondary | ICD-10-CM

## 2020-07-24 DIAGNOSIS — Z720 Tobacco use: Secondary | ICD-10-CM

## 2020-07-24 DIAGNOSIS — D509 Iron deficiency anemia, unspecified: Secondary | ICD-10-CM | POA: Diagnosis present

## 2020-07-24 DIAGNOSIS — N182 Chronic kidney disease, stage 2 (mild): Secondary | ICD-10-CM | POA: Diagnosis present

## 2020-07-24 DIAGNOSIS — I2510 Atherosclerotic heart disease of native coronary artery without angina pectoris: Secondary | ICD-10-CM | POA: Diagnosis present

## 2020-07-24 DIAGNOSIS — I513 Intracardiac thrombosis, not elsewhere classified: Secondary | ICD-10-CM | POA: Diagnosis present

## 2020-07-24 DIAGNOSIS — M79604 Pain in right leg: Secondary | ICD-10-CM

## 2020-07-24 DIAGNOSIS — I739 Peripheral vascular disease, unspecified: Secondary | ICD-10-CM | POA: Diagnosis present

## 2020-07-24 DIAGNOSIS — I482 Chronic atrial fibrillation, unspecified: Secondary | ICD-10-CM | POA: Diagnosis not present

## 2020-07-24 DIAGNOSIS — I219 Acute myocardial infarction, unspecified: Secondary | ICD-10-CM | POA: Diagnosis present

## 2020-07-24 DIAGNOSIS — E785 Hyperlipidemia, unspecified: Secondary | ICD-10-CM | POA: Diagnosis present

## 2020-07-24 DIAGNOSIS — I5022 Chronic systolic (congestive) heart failure: Secondary | ICD-10-CM | POA: Diagnosis present

## 2020-07-24 DIAGNOSIS — L8990 Pressure ulcer of unspecified site, unspecified stage: Secondary | ICD-10-CM | POA: Insufficient documentation

## 2020-07-24 DIAGNOSIS — R262 Difficulty in walking, not elsewhere classified: Secondary | ICD-10-CM

## 2020-07-24 DIAGNOSIS — E876 Hypokalemia: Secondary | ICD-10-CM | POA: Diagnosis present

## 2020-07-24 DIAGNOSIS — M25571 Pain in right ankle and joints of right foot: Secondary | ICD-10-CM

## 2020-07-24 DIAGNOSIS — D72829 Elevated white blood cell count, unspecified: Secondary | ICD-10-CM | POA: Diagnosis present

## 2020-07-24 DIAGNOSIS — I10 Essential (primary) hypertension: Secondary | ICD-10-CM | POA: Diagnosis present

## 2020-07-24 LAB — RESP PANEL BY RT-PCR (FLU A&B, COVID) ARPGX2
Influenza A by PCR: NEGATIVE
Influenza B by PCR: NEGATIVE
SARS Coronavirus 2 by RT PCR: NEGATIVE

## 2020-07-24 LAB — MAGNESIUM: Magnesium: 1.8 mg/dL (ref 1.7–2.4)

## 2020-07-24 LAB — URIC ACID: Uric Acid, Serum: 8.8 mg/dL — ABNORMAL HIGH (ref 3.7–8.6)

## 2020-07-24 LAB — CK: Total CK: 176 U/L (ref 49–397)

## 2020-07-24 MED ORDER — FENTANYL CITRATE (PF) 100 MCG/2ML IJ SOLN
50.0000 ug | Freq: Once | INTRAMUSCULAR | Status: AC
Start: 2020-07-24 — End: 2020-07-24
  Administered 2020-07-24: 50 ug via INTRAVENOUS
  Filled 2020-07-24: qty 2

## 2020-07-24 MED ORDER — ALBUTEROL SULFATE HFA 108 (90 BASE) MCG/ACT IN AERS
2.0000 | INHALATION_SPRAY | RESPIRATORY_TRACT | Status: DC | PRN
  Filled 2020-07-24: qty 6.7

## 2020-07-24 MED ORDER — POTASSIUM CHLORIDE CRYS ER 10 MEQ PO TBCR
10.0000 meq | EXTENDED_RELEASE_TABLET | Freq: Every day | ORAL | Status: DC
  Administered 2020-07-25 – 2020-07-26 (×2): 10 meq via ORAL
  Filled 2020-07-24 (×2): qty 1

## 2020-07-24 MED ORDER — HYDRALAZINE HCL 20 MG/ML IJ SOLN: 5.0000 mg | INTRAMUSCULAR | Status: DC | PRN

## 2020-07-24 MED ORDER — METHYLPREDNISOLONE SODIUM SUCC 40 MG IJ SOLR
40.0000 mg | Freq: Every day | INTRAMUSCULAR | Status: DC
  Administered 2020-07-25 – 2020-07-26 (×2): 40 mg via INTRAVENOUS
  Filled 2020-07-24 (×2): qty 1

## 2020-07-24 MED ORDER — FUROSEMIDE 20 MG PO TABS
20.0000 mg | ORAL_TABLET | Freq: Two times a day (BID) | ORAL | Status: DC
  Administered 2020-07-25: 20 mg via ORAL
  Filled 2020-07-24: qty 1

## 2020-07-24 MED ORDER — NITROGLYCERIN 0.4 MG SL SUBL: 0.4000 mg | SUBLINGUAL_TABLET | SUBLINGUAL | Status: DC | PRN

## 2020-07-24 MED ORDER — APIXABAN 5 MG PO TABS
5.0000 mg | ORAL_TABLET | Freq: Two times a day (BID) | ORAL | Status: DC
Start: 2020-07-24 — End: 2020-07-26
  Administered 2020-07-24 – 2020-07-26 (×4): 5 mg via ORAL
  Filled 2020-07-24 (×4): qty 1

## 2020-07-24 MED ORDER — COLCHICINE 0.6 MG PO TABS
1.2000 mg | ORAL_TABLET | Freq: Once | ORAL | Status: AC
  Administered 2020-07-24: 1.2 mg via ORAL
  Filled 2020-07-24: qty 2

## 2020-07-24 MED ORDER — DM-GUAIFENESIN ER 30-600 MG PO TB12
1.0000 | ORAL_TABLET | Freq: Two times a day (BID) | ORAL | Status: DC | PRN
  Filled 2020-07-24: qty 1

## 2020-07-24 MED ORDER — CARVEDILOL 3.125 MG PO TABS
3.1250 mg | ORAL_TABLET | Freq: Two times a day (BID) | ORAL | Status: DC
  Administered 2020-07-25 – 2020-07-26 (×3): 3.125 mg via ORAL
  Filled 2020-07-24 (×3): qty 1

## 2020-07-24 MED ORDER — ACETAMINOPHEN 325 MG PO TABS: 650.0000 mg | ORAL_TABLET | Freq: Four times a day (QID) | ORAL | Status: DC | PRN

## 2020-07-24 MED ORDER — ACETAMINOPHEN 500 MG PO TABS
1000.0000 mg | ORAL_TABLET | Freq: Once | ORAL | Status: AC
  Administered 2020-07-24: 1000 mg via ORAL
  Filled 2020-07-24: qty 2

## 2020-07-24 MED ORDER — LISINOPRIL 20 MG PO TABS
20.0000 mg | ORAL_TABLET | Freq: Every day | ORAL | Status: DC
Start: 2020-07-25 — End: 2020-07-26
  Administered 2020-07-25 – 2020-07-26 (×2): 20 mg via ORAL
  Filled 2020-07-24 (×2): qty 1

## 2020-07-24 MED ORDER — OXYCODONE-ACETAMINOPHEN 5-325 MG PO TABS
1.0000 | ORAL_TABLET | ORAL | Status: DC | PRN
  Administered 2020-07-24 – 2020-07-26 (×9): 1 via ORAL
  Filled 2020-07-24 (×9): qty 1

## 2020-07-24 MED ORDER — DEXTROMETHORPHAN POLISTIREX ER 30 MG/5ML PO SUER
30.0000 mg | Freq: Two times a day (BID) | ORAL | Status: DC | PRN
  Filled 2020-07-24: qty 5

## 2020-07-24 MED ORDER — SENNOSIDES-DOCUSATE SODIUM 8.6-50 MG PO TABS: 1.0000 | ORAL_TABLET | Freq: Every evening | ORAL | Status: DC | PRN

## 2020-07-24 MED ORDER — GUAIFENESIN ER 600 MG PO TB12: 600.0000 mg | ORAL_TABLET | Freq: Two times a day (BID) | ORAL | Status: DC | PRN

## 2020-07-24 MED ORDER — FERROUS SULFATE 325 (65 FE) MG PO TABS
325.0000 mg | ORAL_TABLET | Freq: Every day | ORAL | Status: DC
  Administered 2020-07-25 – 2020-07-26 (×2): 325 mg via ORAL
  Filled 2020-07-24 (×2): qty 1

## 2020-07-24 MED ORDER — METHYLPREDNISOLONE SODIUM SUCC 125 MG IJ SOLR
60.0000 mg | Freq: Once | INTRAMUSCULAR | Status: AC
  Administered 2020-07-24: 60 mg via INTRAVENOUS
  Filled 2020-07-24: qty 2

## 2020-07-24 MED ORDER — ATORVASTATIN CALCIUM 20 MG PO TABS
80.0000 mg | ORAL_TABLET | Freq: Every day | ORAL | Status: DC
  Administered 2020-07-25 – 2020-07-26 (×2): 80 mg via ORAL
  Filled 2020-07-24 (×2): qty 4

## 2020-07-24 MED ORDER — OXYCODONE HCL 5 MG PO TABS: 5.0000 mg | ORAL_TABLET | Freq: Once | ORAL | Status: DC

## 2020-07-24 MED ORDER — ONDANSETRON HCL 4 MG/2ML IJ SOLN: 4.0000 mg | Freq: Three times a day (TID) | INTRAMUSCULAR | Status: DC | PRN

## 2020-07-24 MED ORDER — PREDNISONE 20 MG PO TABS: 60.0000 mg | ORAL_TABLET | Freq: Once | ORAL | Status: DC

## 2020-07-24 MED ORDER — VITAMIN B-12 1000 MCG PO TABS
1000.0000 ug | ORAL_TABLET | Freq: Every day | ORAL | Status: DC
  Administered 2020-07-25 – 2020-07-26 (×2): 1000 ug via ORAL
  Filled 2020-07-24 (×2): qty 1

## 2020-07-24 MED ORDER — POTASSIUM CHLORIDE CRYS ER 20 MEQ PO TBCR
40.0000 meq | EXTENDED_RELEASE_TABLET | Freq: Once | ORAL | Status: AC
  Administered 2020-07-24: 40 meq via ORAL
  Filled 2020-07-24: qty 2

## 2020-07-24 MED ORDER — FUROSEMIDE 10 MG/ML IJ SOLN
40.0000 mg | Freq: Once | INTRAMUSCULAR | Status: AC
  Administered 2020-07-24: 40 mg via INTRAVENOUS
  Filled 2020-07-24: qty 4

## 2020-07-24 MED ORDER — NICOTINE 21 MG/24HR TD PT24
21.0000 mg | MEDICATED_PATCH | Freq: Every day | TRANSDERMAL | Status: DC
  Administered 2020-07-24 – 2020-07-26 (×3): 21 mg via TRANSDERMAL
  Filled 2020-07-24 (×3): qty 1

## 2020-07-24 NOTE — Evaluation (Signed)
Occupational Therapy Evaluation Patient Details Name: Cameron [name] MRN: [id number] DOB: May 29, 1961 Today's Date: 07/24/2020    History of Present Illness 60 y.o. male with a history of CHF with EF 20-25%, PVD on Eliquis s/p LLE angiography and stenting in 06/2020, gout, CAD, HTN, HLD here with b/l leg pain. Symptoms started 2 weeks ago and became progressively worse; unable to ambulate for the 4 days prior to admission.  Likely gout flare (effecting many joints has not been taking gout meds recently.   Clinical Impression   Cameron [name] was seen for OT evaluation this date. Prior to hospital admission, pt was Independent for mobility and I/ADLs. Pt lives alone with neighbour and friends available PRN. Pt presents to acute OT demonstrating impaired ADL performance and functional mobility 2/2 decreased activity tolerance and functional strength/balance deficits. Pt currently requires MIN A for LB access at bed level - reports anticipates will improve with gout management. Pt demonstrates SUPERVISION + RW for perihhygiene standing. Pt would benefit from skilled OT to address noted impairments and functional limitations (see below for any additional details) in order to maximize safety and independence while minimizing falls risk and caregiver burden. Upon hospital discharge, recommend HHOT to maximize pt safety and return to functional independence during meaningful occupations of daily life.     Follow Up Recommendations  Home health OT;Supervision - Intermittent    Equipment Recommendations  3 in 1 bedside commode    Recommendations for Other Services       Precautions / Restrictions Precautions Precautions: Fall Restrictions Weight Bearing Restrictions: No      Mobility Bed Mobility Overal bed mobility: Modified Independent             General bed mobility comments: Pt able to get up to sitting and back to supine w/o direct assist    Transfers Overall transfer level:  Modified independent Equipment used: Rolling walker (2 wheeled)             General transfer comment: Pt hesitant 2/2 b/l foot pain but was able to rise without direct assist, reliant on walker but no overt safety concerns    Balance Overall balance assessment: Modified Independent                                         ADL either performed or assessed with clinical judgement   ADL Overall ADL's : Needs assistance/impaired                                       General ADL Comments: MIN A for LB access. MOD A standing perihygiene.                  Pertinent Vitals/Pain Pain Assessment: Cameron [name]: 6  Cameron Pain Scale: Hurts even more Pain Location: b/l feet, less so in R elbow, b/l shoulders - general gout flare pain     Hand Dominance Right   Extremity/Trunk Assessment Upper Extremity Assessment Upper Extremity Assessment: Generalized weakness   Lower Extremity Assessment Lower Extremity Assessment: Generalized weakness       Communication Communication Communication: No difficulties   Cognition Arousal/Alertness: Awake/alert Behavior During Therapy: WFL for tasks assessed/performed Overall Cognitive Status: Within Functional Limits for tasks assessed  General Comments  b/l ankles/feet, R elbow, a few fingers with acute, gout-like, swelling    Exercises Exercises: Other exercises Other Exercises Other Exercises: Pt educated re: OT role, DME recs, d/c recs, falls prevention, home/routines modifications Other Exercises: LBD, toieleting, sup<>sit, sti<>Stand x2, sitting/standing balance/toelrance, ~20 ft mobility   Shoulder Instructions      Home Living Family/patient expects to be discharged to:: Private residence Living Arrangements: Alone Available Help at Discharge: Neighbor;Friend(s) Type of Home: House Home Access: Stairs to enter [employer]  of Steps: 5 Entrance Stairs-Rails: Can reach both Home Layout: One level     Bathroom Shower/Tub: Walk-in shower;Tub only   Bathroom Toilet: Standard     Home Equipment: Walker - 2 wheels;Crutches;Shower seat          Prior Functioning/Environment Level of Independence: Independent        Comments: Pt reports independent c I/ADLs including working - reports difficulty walking when gout acts up         OT Problem List: Decreased strength;Decreased activity tolerance;Impaired balance (sitting and/or standing)      OT Treatment/Interventions: Self-care/ADL training;Therapeutic exercise;Energy conservation;DME and/or AE instruction;Therapeutic activities;Balance training;Patient/family education    OT Goals(Current goals can be found in the care plan section) Acute Rehab OT Goals Patient Stated Goal: Go home, get gout under control OT Goal Formulation: With patient Time For Goal Achievement: 08/07/20 Potential to Achieve Goals: Good ADL Goals Pt Will Perform Grooming: with modified independence;standing (c LRAD PRN) Pt Will Perform Lower Body Dressing: with modified independence;sit to/from stand (c LRAD PRN) Pt Will Transfer to Toilet: with modified independence;ambulating;regular height toilet (c LRAD PRN) Additional ADL Goal #1: Pt will Independently verbalize plan to implement x3 falls prevention strategies  OT Frequency: Min 2X/week   Barriers to D/C: Decreased caregiver support             AM-PAC OT "6 Clicks" Daily Activity     Outcome Measure Help from another person eating meals?: None Help from another person taking care of personal grooming?: A Little Help from another person toileting, which includes using toliet, bedpan, or urinal?: A Little Help from another person bathing (including washing, rinsing, drying)?: A Little Help from another person to put on and taking off regular upper body clothing?: None Help from another person to put on and taking off  regular lower body clothing?: A Little 6 Click [name]: 20   End of Session    Activity Tolerance: Patient tolerated treatment well Patient left: in bed;with call bell/phone within reach  OT Visit Diagnosis: Other abnormalities of gait and mobility (R26.89);Muscle weakness (generalized) (M62.81)                Time: 1610-9604 OT Time Calculation (min): 28 min Charges:  OT General Charges $OT Visit: 1 Visit OT Evaluation $OT Eval Low Complexity: 1 Low OT Treatments $Self Care/Home Management : 23-37 mins  Kathie Dike, M.S. OTR/L  07/24/20, 4:59 PM  ascom [phone]

## 2020-07-24 NOTE — Evaluation (Signed)
Physical Therapy Evaluation Patient Details Name: Cameron [name] MRN: [id number] DOB: [birth year] Today's Date: 07/24/2020   History of Present Illness  60 y.o. male with a history of CHF with EF 20-25%, PVD on Eliquis s/p LLE angiography and stenting in 06/2020, gout, CAD, HTN, HLD here with b/l leg pain. Symptoms started 2 weeks ago and became progressively worse; unable to ambulate for the 4 days prior to admission.  Likely gout flare (effecting many joints has not been taking gout meds recently.  Clinical Impression  Pt reports he has had gout flares in the past that typically only last ~3 days, but states he was told to stop gout meds and subsequently he had been completely unable to get up for the last few days before coming to [facility].  He does have less acute pain during our session and was able to tolerate a brief bout of in-room ambulation with heavy reliance on the walker.  He was safe with the effort and is confident that as his pain continues to decrease he will be able to return home.  He is alone but has a neighbor that typically comes by [facility] and is apparently very willing to help.  Pt open to the idea of HHPT if he is still limited/not at baseline at time of d/c.      Follow Up Recommendations Home health PT (per progress)    Equipment Recommendations  None recommended by PT    Recommendations for Other Services       Precautions / Restrictions Precautions Precautions: Fall Restrictions Weight Bearing Restrictions: No      Mobility  Bed Mobility Overal bed mobility: Modified Independent             General bed mobility comments: Pt able to get up to sitting and back to supine w/o direct assist    Transfers Overall transfer level: Modified independent Equipment used: Rolling walker (2 wheeled)             General transfer comment: Pt hesitant 2/2 b/l foot pain but was able to rise without direct assist, reliant on walker but no overt safety  concerns  Ambulation/Gait Ambulation/Gait assistance: Min guard Gait Distance (Feet): 20 Feet Assistive device: Rolling walker (2 wheeled)       General Gait Details: Pt was able to do some limited in-room ambulation but 2/2 gout pain did not want to do too much.  He was reliant on the walker, but was ultimately pleased that he was able to do as much as he managed as he had not been able to take even a single step over the last few days  Stairs            Wheelchair Mobility    Modified Rankin (Stroke Patients Only)       Balance Overall balance assessment: Modified Independent (reliant on walker in standing, but no near LOBs, etc)                                           Pertinent Vitals/Pain Pain Assessment: 0-10 Pain Score: 6  Pain Location: b/l feet, less so in R elbow, b/l shoulders - general gout flare pain    Home Living Family/patient expects to be discharged to:: Private residence Living Arrangements: Alone Available Help at Discharge: Neighbor;Friend(s) Type of Home: House Home Access: Stairs to enter Entrance Stairs-Rails: Can reach both  Entrance Stairs-Number of Steps: 5 Home Layout: One level Home Equipment: Walker - 2 wheels;Crutches;Shower seat      Prior Function Level of Independence: Independent         Comments: Pt reports independent c I/ADLs including working - reports difficulty walking when gout acts up      Hand Dominance   Dominant Hand: Right    Extremity/Trunk Assessment   Upper Extremity Assessment Upper Extremity Assessment: Generalized weakness (hesitant 2/2 pain)    Lower Extremity Assessment Lower Extremity Assessment: Generalized weakness (limited 2/2 pain)       Communication   Communication: No difficulties  Cognition Arousal/Alertness: Awake/alert Behavior During Therapy: WFL for tasks assessed/performed Overall Cognitive Status: Within Functional Limits for tasks assessed                                         General Comments General comments (skin integrity, edema, etc.): b/l ankles/feet, R elbow, a few fingers with acute, gout-like, swelling    Exercises     Assessment/Plan    PT Assessment Patient needs continued PT services  PT Problem List Decreased strength;Decreased range of motion;Decreased activity tolerance;Decreased balance;Decreased mobility;Pain       PT Treatment Interventions DME instruction;Gait training;Stair training;Functional mobility training;Therapeutic exercise;Therapeutic activities;Balance training;Patient/family education    PT Goals (Current goals can be found in the Care Plan section)  Acute Rehab PT Goals Patient Stated Goal: Go home, get gout under control PT Goal Formulation: With patient Time For Goal Achievement: 08/07/20 Potential to Achieve Goals: Good    Frequency Min 2X/week   Barriers to discharge        Co-evaluation               AM-PAC PT "6 Clicks" Mobility  Outcome Measure Help needed turning from your back to your side while in a flat bed without using bedrails?: None Help needed moving from lying on your back to sitting on the side of a flat bed without using bedrails?: None Help needed moving to and from a bed to a chair (including a wheelchair)?: None Help needed standing up from a chair using your arms (e.g., wheelchair or bedside chair)?: None Help needed to walk in hospital room?: A Little Help needed climbing 3-5 steps with a railing? : A Lot 6 Click Score: 21    End of Session Equipment Utilized During Treatment: Gait belt Activity Tolerance: Patient tolerated treatment well;Patient limited by pain Patient left: in bed;with call bell/phone within reach Nurse Communication: Mobility status PT Visit Diagnosis: Unsteadiness on feet (R26.81);Muscle weakness (generalized) (M62.81);Pain;Difficulty in walking, not elsewhere classified (R26.2) Pain - Right/Left: Left Pain - part of  body: Ankle and joints of foot    Time: 1207-1233 PT Time Calculation (min) (ACUTE ONLY): 26 min   Charges:   PT Evaluation $PT Eval Low Complexity: 1 Low          Malachi Pro, DPT 07/24/2020, 3:02 PM

## 2020-07-24 NOTE — ED Notes (Signed)
Pt eating lunch at this time. Temp in room adjusted for pt.

## 2020-07-24 NOTE — ED Notes (Signed)
PT at bedside.

## 2020-07-24 NOTE — ED Provider Notes (Signed)
[facility] Emergency Department Provider Note  ____________________________________________  Time seen: Approximately 6:16 AM  I have reviewed the triage vital signs and the nursing notes.   HISTORY  Chief Complaint Leg Pain   HPI Cameron [name] is a 60 y.o. male with a history of CHF with EF 20-25%, PVD on Eliquis s/p LLE angiography and stenting in 06/2020, gout, CAD, HTN, HLD who presents for evaluation of b/l leg pain. Patient reports that symptoms started 2 weeks ago and became progressively worse.  Patient has been unable to ambulate for the last 4 days.  Today he was found by family member in bed soiled and unable to stand and walk a 911 was called. Patient reports that he thinks the pain is coming from a gout flare.  He has run out of his colchicine which he usually takes for gout.  He is complaining of left lower extremity pain that starts in the knee and moves down to the leg.  The pain is throbbing, constant, severe.  Is also complaining of right lower extremity pain which is mostly on his ankle area for which he attributes your gout flare.  He denies any chest pain or shortness of breath.  he denies any fever or chills.  He endorses compliance with his Eliquis and Lasix.  He denies any prior history of DVT.  No trauma, no back pain, no saddle anesthesia, no urinary or bowel incontinence or retention  Past Medical History:  Diagnosis Date  . CHF (congestive heart failure) (HCC)   . Coronary artery disease    a. 12/17 PCI with DES to pLAD with resdiual disease (RX therapy)  . Hyperlipidemia   . Hypertension   . PAD (peripheral artery disease) (HCC)   . STEMI (ST elevation myocardial infarction) [facility]) 2017    Patient Active Problem List   Diagnosis Date Noted  . CKD (chronic kidney disease), stage II   . Hypotension   . Acute on chronic respiratory failure with hypoxia (HCC)   . PVD (peripheral vascular disease) (HCC)   . AKI (acute kidney  injury) (HCC)   . Macrocytic anemia   . Atherosclerosis of native arteries of extremity with rest pain (HCC) 06/19/2020  . Acute on chronic systolic CHF (congestive heart failure) (HCC) 02/17/2020  . Hypokalemia 02/17/2020  . Hypomagnesemia 02/17/2020  . Elevated troponin 02/17/2020  . New onset atrial fibrillation (HCC) 02/17/2020  . Olecranon bursitis of left elbow 02/17/2020  . NSVT (nonsustained ventricular tachycardia) (HCC) 02/17/2020  . Tobacco abuse 02/17/2020  . Apical mural thrombus 02/17/2020  . Gout_left elbow 02/17/2020  . Bilateral leg edema 10/19/2018  . Weight gain 10/19/2018  . Chest pain 02/02/2018  . Acute pain of right knee 03/13/2017  . Essential hypertension 06/20/2016  . Hyperlipidemia 06/20/2016  . CAD (coronary artery disease), native coronary artery 06/20/2016  . STEMI involving oth coronary artery of anterior wall (HCC) 06/17/2016  . STEMI (ST elevation myocardial infarction) (HCC) 06/17/2016    Past Surgical History:  Procedure Laterality Date  . CARDIAC CATHETERIZATION N/A 06/17/2016   Procedure: Left Heart Cath and Coronary Angiography;  Surgeon: Runell Gess, MD;  Location: [facility] INVASIVE CV LAB;  Service: Cardiovascular;  Laterality: N/A;  . CARDIAC CATHETERIZATION N/A 06/17/2016   Procedure: Coronary Stent Intervention;  Surgeon: Runell Gess, MD;  Location: [facility] INVASIVE CV LAB;  Service: Cardiovascular;  Laterality: N/A;  . cardiac stents    . CERVICAL SPINE SURGERY    . LOWER EXTREMITY  ANGIOGRAPHY Left 06/25/2020   Procedure: LOWER EXTREMITY ANGIOGRAPHY;  Surgeon: Annice Needy, MD;  Location: [facility] INVASIVE CV LAB;  Service: Cardiovascular;  Laterality: Left;    Prior to Admission medications   Medication Sig Start Date End Date Taking? Authorizing Provider  apixaban (ELIQUIS) 5 MG TABS tablet Take 1 tablet (5 mg total) by mouth 2 (two) times daily. 06/29/20 07/29/20  Alford Highland, MD  atorvastatin (LIPITOR) 80 MG tablet Take 1 tablet  (80 mg total) by mouth daily. 02/21/20   Charise Killian, MD  carvedilol (COREG) 3.125 MG tablet Take 1 tablet (3.125 mg total) by mouth 2 (two) times daily with a meal. 06/29/20   Alford Highland, MD  colchicine 0.6 MG tablet Take 1 tablet (0.6 mg total) by mouth daily as needed. 06/29/20 07/29/20  Alford Highland, MD  ferrous sulfate 325 (65 FE) MG tablet Take 1 tablet (325 mg total) by mouth daily with breakfast. 06/29/20   Alford Highland, MD  furosemide (LASIX) 40 MG tablet Take 0.5 tablets (20 mg total) by mouth 2 (two) times daily. 06/29/20 07/29/20  Alford Highland, MD  HYDROcodone-acetaminophen (NORCO/VICODIN) 5-325 MG tablet Take 1 tablet by mouth every 4 (four) hours as needed for moderate pain. 06/29/20   Alford Highland, MD  nitroGLYCERIN (NITROSTAT) 0.4 MG SL tablet Place 1 tablet (0.4 mg total) under the tongue every 5 (five) minutes as needed. 06/20/16   Arty Baumgartner, NP  polyethylene glycol (MIRALAX / GLYCOLAX) 17 g packet Take 17 g by mouth daily as needed for moderate constipation. Patient not taking: Reported on 07/10/2020 06/29/20   Alford Highland, MD  potassium chloride (KLOR-CON) 10 MEQ tablet Take 1 tablet (10 mEq total) by mouth daily. 06/29/20 07/29/20  Alford Highland, MD  VENTOLIN HFA 108 (90 Base) MCG/ACT inhaler Inhale 2 puffs into the lungs every 6 (six) hours as needed for wheezing or shortness of breath. Patient not taking: Reported on 07/10/2020 06/29/20   Alford Highland, MD  vitamin B-12 1000 MCG tablet Take 1 tablet (1,000 mcg total) by mouth daily. 06/30/20   Alford Highland, MD  isosorbide mononitrate (IMDUR) 60 MG 24 hr tablet Take 1 tablet (60 mg total) by mouth daily. 02/04/18 05/30/19  [name], MD    Allergies Patient has no known allergies.  Family History  Problem Relation Age of Onset  . CAD Paternal Grandfather   . Breast cancer Mother   . Parkinson's disease Father   . Alzheimer's disease Father   . Hypertension Father   .  Diabetes Father     Social History Social History   Tobacco Use  . Smoking status: Current Every Day Smoker    Packs/day: 1.00    Types: Cigarettes  . Smokeless tobacco: Never Used  Vaping Use  . Vaping Use: Never used  Substance Use Topics  . Alcohol use: Not Currently    Comment: weekends  . Drug use: Yes    Frequency: 1.0 times per week    Types: Marijuana    Comment: twice monthly    Review of Systems  Constitutional: Negative for fever. Eyes: Negative for visual changes. ENT: Negative for sore throat. Neck: No neck pain  Cardiovascular: Negative for chest pain. Respiratory: Negative for shortness of breath. Gastrointestinal: Negative for abdominal pain, vomiting or diarrhea. Genitourinary: Negative for dysuria. Musculoskeletal: Negative for back pain. + b/l leg pain and swelling Skin: Negative for rash. Neurological: Negative for headaches, weakness or numbness. Psych: No SI or HI  ____________________________________________   PHYSICAL  EXAM:  VITAL SIGNS: Vitals:   07/23/20 1627 07/23/20 1838  BP:  112/76  Pulse: (!) 102 95  Resp:  19  Temp:  97.9 F (36.6 C)  SpO2: 95% 97%    Constitutional: Alert and oriented, chronically ill appearing. HEENT:      Head: Normocephalic and atraumatic.         Eyes: Conjunctivae are normal. Sclera is non-icteric.       Mouth/Throat: Mucous membranes are moist.       Neck: Supple with no signs of meningismus. Cardiovascular: Regular rate and rhythm. No murmurs, gallops, or rubs. 2+ symmetrical distal pulses are present in all extremities. No JVD. Respiratory: Normal respiratory effort. Lungs are clear to auscultation bilaterally. No wheezes, crackles, or rhonchi.  Gastrointestinal: Soft, non tender. Musculoskeletal: Bilateral LE pitting edema R>L, extremities are warm and well perfused with dopplerable PT and DP pulses and brisk capillary refill. There is swelling, erythema, and warmth of the R ankle. No swelling,  erythema or warmth of any joint on the LLE. Neurologic: Normal speech and language. Face is symmetric. Moving all extremities. No gross focal neurologic deficits are appreciated. Skin: Skin is warm, dry and intact. No rash noted. Psychiatric: Mood and affect are normal. Speech and behavior are normal.  ____________________________________________   LABS (all labs ordered are listed, but only abnormal results are displayed)  Labs Reviewed  BASIC METABOLIC PANEL - Abnormal; Notable for the following components:      Result Value   Potassium 3.2 (*)    Glucose, Bld 109 (*)    BUN 22 (*)    All other components within normal limits  CBC - Abnormal; Notable for the following components:   WBC 14.3 (*)    RBC 3.08 (*)    Hemoglobin 11.8 (*)    HCT 36.6 (*)    MCV 118.8 (*)    MCH 38.3 (*)    RDW 16.2 (*)    All other components within normal limits  BRAIN NATRIURETIC PEPTIDE - Abnormal; Notable for the following components:   B Natriuretic Peptide 1,973.2 (*)    All other components within normal limits  RESP PANEL BY RT-PCR (FLU A&B, COVID) ARPGX2  CK  URIC ACID   ____________________________________________  EKG  ED ECG REPORT I, Nita Sickle, the attending physician, personally viewed and interpreted this ECG.  Atrial fibrillation, rate of 95, normal QTC, left axis deviation, no ST elevations or depressions.  Unchanged from prior from December 2021 ____________________________________________  RADIOLOGY  I have personally reviewed the images performed during this visit and I agree with the Radiologist's read.   Interpretation by Radiologist:  DG Chest 1 View  Result Date: 07/24/2020 CLINICAL DATA:  Right-sided chest pain EXAM: CHEST  1 VIEW COMPARISON:  July 18, 2020 FINDINGS: The heart size and mediastinal contours are mildly enlarged. There is prominence of the central pulmonary vasculature. No large airspace consolidation or pleural effusion. The visualized  skeletal structures are unremarkable. IMPRESSION: Unchanged cardiomegaly and pulmonary vascular congestion Electronically Signed   By: Jonna Clark M.D.   On: 07/24/2020 03:34     ____________________________________________   PROCEDURES  Procedure(s) performed:yes .1-3 Lead EKG Interpretation Performed by: Nita Sickle, MD Authorized by: Nita Sickle, MD     Interpretation: abnormal     ECG rate assessment: normal     Rhythm: atrial fibrillation     Ectopy: none     Critical Care performed:  None ____________________________________________   INITIAL IMPRESSION / ASSESSMENT AND PLAN /  ED COURSE  60 y.o. male with a history of CHF with EF 20-25%, PVD on Eliquis s/p LLE angiography and stenting in 06/2020, gout, CAD, HTN, HLD who presents for evaluation of b/l leg pain. No signs of cyanosis or ischemia. No signs of cauda equina.  Legs are warm and well perfused with dopplerable PT and DP pulses, brisk capillary refill.  Patient has pretty significant pitting edema right worse than left.  Does have signs of a gout flare on the right ankle but no signs of gout on the left lower extremity.  No signs of cellulitis.  Possibly deconditioning plus gout plus edema causing difficulty for patient to walk.  We will check a CK since patient has been in bed for several days.  Patient has elevated white count of 14 which seems to be chronic for him.  No significant electrolyte derangements.  BNP is elevated at 1973 but lower than last admission which was in the 3000's.  Chest x-ray visualized by me showing signs of CHF, confirmed by radiology.  Patient does not complain of any shortness of breath and has no hypoxia. Will treat with tylenol, fentanyl, lasix, solumedrol, and colchicine. Patient unable to ambulate. Will require admission for PT, pain control, diureses, and gout flair control.  Old medical records reviewed including patient's last visit with Dr. Wyn Quaker from December 2021          _____________________________________________ Please note:  Patient was evaluated in Emergency Department today for the symptoms described in the history of present illness. Patient was evaluated in the context of the global COVID-19 pandemic, which necessitated consideration that the patient might be at risk for infection with the SARS-CoV-2 virus that causes COVID-19. Institutional protocols and algorithms that pertain to the evaluation of patients at risk for COVID-19 are in a state of rapid change based on information released by regulatory bodies including the CDC and federal and state organizations. These policies and algorithms were followed during the patient's care in the ED.  Some ED evaluations and interventions may be delayed as a result of limited staffing during the pandemic.   [place] Controlled Substance Database was reviewed by me. ____________________________________________   FINAL CLINICAL IMPRESSION(S) / ED DIAGNOSES   Final diagnoses:  Bilateral leg pain  Acute gout of right ankle, unspecified cause  Bilateral leg edema  Inability to walk      NEW MEDICATIONS STARTED DURING THIS VISIT:  ED Discharge Orders    None       Note:  This document was prepared using Dragon voice recognition software and may include unintentional dictation errors.    Nita Sickle, MD 07/24/20 [phone]

## 2020-07-24 NOTE — Consult Note (Signed)
ORTHOPAEDIC CONSULTATION  REQUESTING PHYSICIAN: Lorretta Harp, MD  Chief Complaint: hand, knee, ankle, foot pain  HPI: Cameron [name] is a 60 y.o. male who complains of pain and swelling in multiple joints. Please see H&P and ED notes for details. Denies any numbness, tingling or constitutional symptoms.  Past Medical History:  Diagnosis Date  . CHF (congestive heart failure) (HCC)   . Coronary artery disease    a. 12/17 PCI with DES to pLAD with resdiual disease (RX therapy)  . Hyperlipidemia   . Hypertension   . PAD (peripheral artery disease) (HCC)   . STEMI (ST elevation myocardial infarction) (HCC) 2017   Past Surgical History:  Procedure Laterality Date  . CARDIAC CATHETERIZATION N/A 06/17/2016   Procedure: Left Heart Cath and Coronary Angiography;  Surgeon: Runell Gess, MD;  Location: [facility] INVASIVE CV LAB;  Service: Cardiovascular;  Laterality: N/A;  . CARDIAC CATHETERIZATION N/A 06/17/2016   Procedure: Coronary Stent Intervention;  Surgeon: Runell Gess, MD;  Location: [facility] INVASIVE CV LAB;  Service: Cardiovascular;  Laterality: N/A;  . cardiac stents    . CERVICAL SPINE SURGERY    . LOWER EXTREMITY ANGIOGRAPHY Left 06/25/2020   Procedure: LOWER EXTREMITY ANGIOGRAPHY;  Surgeon: Annice Needy, MD;  Location: [facility] INVASIVE CV LAB;  Service: Cardiovascular;  Laterality: Left;   Social History   Socioeconomic History  . Marital status: Divorced    Spouse name: Not on file  . Number of children: Not on file  . Years of education: Not on file  . Highest education level: Not on file  Occupational History  . Not on file  Tobacco Use  . Smoking status: Current Every Day Smoker    Packs/day: 1.00    Types: Cigarettes  . Smokeless tobacco: Never Used  Vaping Use  . Vaping Use: Never used  Substance and Sexual Activity  . Alcohol use: Not Currently    Comment: weekends  . Drug use: Yes    Frequency: 1.0 times per week    Types: Marijuana    Comment: twice  monthly  . Sexual activity: Not on file  Other Topics Concern  . Not on file  Social History Narrative  . Not on file   Social Determinants of Health   Financial Resource Strain: Not on file  Food Insecurity: Not on file  Transportation Needs: Not on file  Physical Activity: Not on file  Stress: Not on file  Social Connections: Not on file   Family History  Problem Relation Age of Onset  . CAD Paternal Grandfather   . Breast cancer Mother   . Parkinson's disease Father   . Alzheimer's disease Father   . Hypertension Father   . Diabetes Father    No Known Allergies Prior to Admission medications   Medication Sig Start Date End Date Taking? Authorizing Provider  apixaban (ELIQUIS) 5 MG TABS tablet Take 1 tablet (5 mg total) by mouth 2 (two) times daily. 06/29/20 07/29/20 Yes Wieting, Richard, MD  atorvastatin (LIPITOR) 80 MG tablet Take 1 tablet (80 mg total) by mouth daily. 02/21/20  Yes Charise Killian, MD  carvedilol (COREG) 3.125 MG tablet Take 1 tablet (3.125 mg total) by mouth 2 (two) times daily with a meal. 06/29/20  Yes Wieting, Richard, MD  colchicine 0.6 MG tablet Take 1 tablet (0.6 mg total) by mouth daily as needed. 06/29/20 07/29/20 Yes Wieting, Richard, MD  ferrous sulfate 325 (65 FE) MG tablet Take 1 tablet (325 mg total) by mouth  daily with breakfast. 06/29/20  Yes Wieting, Richard, MD  furosemide (LASIX) 40 MG tablet Take 0.5 tablets (20 mg total) by mouth 2 (two) times daily. 06/29/20 07/29/20 Yes Wieting, Richard, MD  lisinopril (ZESTRIL) 10 MG tablet Take 20 mg by mouth daily.   Yes [provider]  potassium chloride (KLOR-CON) 10 MEQ tablet Take 1 tablet (10 mEq total) by mouth daily. 06/29/20 07/29/20 Yes Wieting, Richard, MD  vitamin B-12 1000 MCG tablet Take 1 tablet (1,000 mcg total) by mouth daily. 06/30/20  Yes Wieting, Richard, MD  HYDROcodone-acetaminophen (NORCO/VICODIN) 5-325 MG tablet Take 1 tablet by mouth every 4 (four) hours as needed  for moderate pain. Patient not taking: Reported on 07/24/2020 06/29/20   Alford Highland, MD  nitroGLYCERIN (NITROSTAT) 0.4 MG SL tablet Place 1 tablet (0.4 mg total) under the tongue every 5 (five) minutes as needed. Patient not taking: Reported on 07/24/2020 06/20/16   Laverda Page B, NP  polyethylene glycol (MIRALAX / GLYCOLAX) 17 g packet Take 17 g by mouth daily as needed for moderate constipation. Patient not taking: No sig reported 06/29/20   Alford Highland, MD  VENTOLIN HFA 108 [id number] Base) MCG/ACT inhaler Inhale 2 puffs into the lungs every 6 (six) hours as needed for wheezing or shortness of breath. Patient not taking: No sig reported 06/29/20   Alford Highland, MD  isosorbide mononitrate (IMDUR) 60 MG 24 hr tablet Take 1 tablet (60 mg total) by mouth daily. 02/04/18 05/30/19  [name], MD   DG Chest 1 View  Result Date: 07/24/2020 CLINICAL DATA:  Right-sided chest pain EXAM: CHEST  1 VIEW COMPARISON:  July 18, 2020 FINDINGS: The heart size and mediastinal contours are mildly enlarged. There is prominence of the central pulmonary vasculature. No large airspace consolidation or pleural effusion. The visualized skeletal structures are unremarkable. IMPRESSION: Unchanged cardiomegaly and pulmonary vascular congestion Electronically Signed   By: Jonna Clark M.D.   On: 07/24/2020 03:34   US Venous Img Lower Bilateral (DVT)  Result Date: 07/24/2020 CLINICAL DATA:  Bilateral lower extremity pain and edema for the past 2 weeks. History of smoking. Patient is currently on anticoagulation. Evaluate for DVT. EXAM: BILATERAL LOWER EXTREMITY VENOUS DOPPLER ULTRASOUND TECHNIQUE: Gray-scale sonography with graded compression, as well as color Doppler and duplex ultrasound were performed to evaluate the lower extremity deep venous systems from the level of the common femoral vein and including the common femoral, femoral, profunda femoral, popliteal and calf veins including the posterior tibial,  peroneal and gastrocnemius veins when visible. The superficial great saphenous vein was also interrogated. Spectral Doppler was utilized to evaluate flow at rest and with distal augmentation maneuvers in the common femoral, femoral and popliteal veins. COMPARISON:  Left lower extremity venous Doppler ultrasound-07/18/2020; 12/13/2018 (both negative) Right lower extremity venous Doppler ultrasound-03/05/2017 (negative) FINDINGS: RIGHT LOWER EXTREMITY Common Femoral Vein: No evidence of thrombus. Normal compressibility, respiratory phasicity and response to augmentation. Saphenofemoral Junction: No evidence of thrombus. Normal compressibility and flow on color Doppler imaging. Profunda Femoral Vein: No evidence of thrombus. Normal compressibility and flow on color Doppler imaging. Femoral Vein: No evidence of thrombus. Normal compressibility, respiratory phasicity and response to augmentation. Popliteal Vein: No evidence of thrombus. Normal compressibility, respiratory phasicity and response to augmentation. Calf Veins: No evidence of thrombus. Normal compressibility and flow on color Doppler imaging. Superficial Great Saphenous Vein: No evidence of thrombus. Normal compressibility. Venous Reflux:  None. Other Findings: There is a moderate amount of subcutaneous edema at the level of the  right lower leg and calf (representative image 35). LEFT LOWER EXTREMITY Common Femoral Vein: No evidence of thrombus. Normal compressibility, respiratory phasicity and response to augmentation. Saphenofemoral Junction: No evidence of thrombus. Normal compressibility and flow on color Doppler imaging. Profunda Femoral Vein: No evidence of thrombus. Normal compressibility and flow on color Doppler imaging. Femoral Vein: No evidence of thrombus. Normal compressibility, respiratory phasicity and response to augmentation. Popliteal Vein: No evidence of thrombus. Normal compressibility, respiratory phasicity and response to augmentation.  Calf Veins: No evidence of thrombus. Normal compressibility and flow on color Doppler imaging. Superficial Great Saphenous Vein: No evidence of thrombus. Normal compressibility. Venous Reflux:  None. Other Findings: There is a moderate amount of subcutaneous edema at the level of the left lower leg and calf (representative image 68). IMPRESSION: 1. No evidence of DVT within either lower extremity. 2. Moderate amount of subcutaneous edema at the level of the bilateral lower legs and calves. Electronically Signed   By: Simonne Come M.D.   On: 07/24/2020 09:52   DG Ankle Right Port  Result Date: 07/24/2020 CLINICAL DATA:  Right ankle swelling and pain.  No known injury. EXAM: PORTABLE RIGHT ANKLE - 2 VIEW COMPARISON:  None. FINDINGS: There is no acute bony or joint abnormality. Os trigonum and plantar calcaneal spurring noted. Soft tissue swelling is present about the ankle. No soft tissue gas or radiopaque foreign body. IMPRESSION: Soft tissue swelling without underlying acute bony or joint abnormality. Electronically Signed   By: Drusilla Kanner M.D.   On: 07/24/2020 11:24    Positive ROS: All other systems have been reviewed and were otherwise negative with the exception of those mentioned in the HPI and as above.  Physical Exam: General: Alert, no acute distress Cardiovascular: No pedal edema Respiratory: No cyanosis, no use of accessory musculature GI: No organomegaly, abdomen is soft and non-tender Skin: No lesions in the area of chief complaint Neurologic: Sensation intact distally Psychiatric: Patient is competent for consent with normal mood and affect Lymphatic: No axillary or cervical lymphadenopathy  MUSCULOSKELETAL: right ankle, right small finger, bilateral knees tender to light touch, moderate swelling. Compartments soft. Good cap refill. Motor and sensory intact distally.  Assessment: Gout flare  Plan: This appears to be a gout flare of multiple joints. Septic arthritis is very  unlikely. I would not recommend aspirating his joints but rather treating empirically. He may follow-up with orthopedics as needed. Please call with questions.    Lyndle Herrlich, MD    07/24/2020 12:50 PM

## 2020-07-24 NOTE — H&P (Signed)
History and Physical    Cameron ChessmanJeffrey R Schaffer VHQ:[id number] DOB: 01/25/1961 DOA: 07/24/2020  Referring MD/NP/PA:   [facility]   Patient coming from:  The patient is coming from home.  At baseline, pt is independent for most of ADL.        Chief Complaint: Multiple joint pain  HPI: Cameron [name] is a 60 y.o. male with medical history significant of hypertension, hyperlipidemia, gout, atrial fibrillation and apical mural thrombosis on Eliquis, tobacco abuse, PVD (s/p of stent placement to left leg by Dr. Wyn Quakerew 06/2020), CAD, STEMI, CHF with EF 20- 25%, CKD-2, who presented with leg pain.  Patient states that he has history of colchicine, but stopped taking his colchicine.  Patient states that because he is taking Eliquis, his cardiologist told him to stop taking colchicine due to possible drug interaction. He developed pain in multiple joints, including bilateral knees, right elbow, bilateral ankles, the right ankle is most painful per pt. The right elbow is erythematous. Symptoms have been going on for about 2 weeks, which has been progressively worsening.  The pain is constant, moderate to severe, sharp, nonradiating.  Patient denies fever or chills. Pt has been unable to walk normally due to severe pain in the past 4 days.  Patient does not have chest pain, shortness of breath, cough, nausea, vomiting, diarrhea, abdominal pain, symptoms of UTI.  No unilateral numbness or tingling of extremities.  Per EDP,  Pt has bilateral dopplerable PT and DP pulses.   ED Course: pt was found to have WBC 14.3, uric acid 8.8, CK1 76, pending COVID-19 PCR, stable renal function, BMP 1973, temperature normal, blood pressure 97/73, heart rate 102, 91, oxygen saturation 97% on room air, chest x-ray showed cardiomegaly and vascular congestion.  Lower extremity venous Doppler negative for DVT.  Patient is admitted to MedSurg bed as inpatient  Review of Systems:   General: no fevers, chills, no body  weight gain, has fatigue HEENT: no blurry vision, hearing changes or sore throat Respiratory: no dyspnea, coughing, wheezing CV: no chest pain, no palpitations GI: no nausea, vomiting, abdominal pain, diarrhea, constipation GU: no dysuria, burning on urination, increased urinary frequency, hematuria  Ext: has leg edema and leg pain Neuro: no unilateral weakness, numbness, or tingling, no vision change or hearing loss Skin: no rash, no skin tear. MSK:  Has pain in multiple joints, including bilateral knees, right elbow, bilateral ankles. Heme: No easy bruising.  Travel history: No recent long distant travel.  Allergy: No Known Allergies  Past Medical History:  Diagnosis Date  . CHF (congestive heart failure) (HCC)   . Coronary artery disease    a. 12/17 PCI with DES to pLAD with resdiual disease (RX therapy)  . Hyperlipidemia   . Hypertension   . PAD (peripheral artery disease) (HCC)   . STEMI (ST elevation myocardial infarction) (HCC) 2017    Past Surgical History:  Procedure Laterality Date  . CARDIAC CATHETERIZATION N/A 06/17/2016   Procedure: Left Heart Cath and Coronary Angiography;  Surgeon: Runell GessJonathan J Berry, MD;  [facility];  Service: Cardiovascular;  Laterality: N/A;  . CARDIAC CATHETERIZATION N/A 06/17/2016   Procedure: Coronary Stent Intervention;  Surgeon: Runell GessJonathan J Berry, MD;  Location: [facility] INVASIVE CV LAB;  Service: Cardiovascular;  Laterality: N/A;  . cardiac stents    . CERVICAL SPINE SURGERY    . LOWER EXTREMITY ANGIOGRAPHY Left 06/25/2020   Procedure: LOWER EXTREMITY ANGIOGRAPHY;  Surgeon: Annice Needyew, Jason S, MD;  Location: [facility] INVASIVE CV LAB;  Service: Cardiovascular;  Laterality: Left;    Social History:  reports that he has been smoking cigarettes. He has been smoking about 1.00 pack per day. He has never used smokeless tobacco. He reports previous alcohol use. He reports current drug use. Frequency: 1.00 time per week. Drug: Marijuana.  Family  History:  Family History  Problem Relation Age of Onset  . CAD Paternal Grandfather   . Breast cancer Mother   . Parkinson's disease Father   . Alzheimer's disease Father   . Hypertension Father   . Diabetes Father      Prior to Admission medications   Medication Sig Start Date End Date Taking? Authorizing Provider  apixaban (ELIQUIS) 5 MG TABS tablet Take 1 tablet (5 mg total) by mouth 2 (two) times daily. 06/29/20 07/29/20  Alford Highland, MD  atorvastatin (LIPITOR) 80 MG tablet Take 1 tablet (80 mg total) by mouth daily. 02/21/20   Charise Killian, MD  carvedilol (COREG) 3.125 MG tablet Take 1 tablet (3.125 mg total) by mouth 2 (two) times daily with a meal. 06/29/20   Alford Highland, MD  colchicine 0.6 MG tablet Take 1 tablet (0.6 mg total) by mouth daily as needed. 06/29/20 07/29/20  Alford Highland, MD  ferrous sulfate 325 (65 FE) MG tablet Take 1 tablet (325 mg total) by mouth daily with breakfast. 06/29/20   Alford Highland, MD  furosemide (LASIX) 40 MG tablet Take 0.5 tablets (20 mg total) by mouth 2 (two) times daily. 06/29/20 07/29/20  Alford Highland, MD  HYDROcodone-acetaminophen (NORCO/VICODIN) 5-325 MG tablet Take 1 tablet by mouth every 4 (four) hours as needed for moderate pain. 06/29/20   Alford Highland, MD  nitroGLYCERIN (NITROSTAT) 0.4 MG SL tablet Place 1 tablet (0.4 mg total) under the tongue every 5 (five) minutes as needed. 06/20/16   Arty Baumgartner, NP  polyethylene glycol (MIRALAX / GLYCOLAX) 17 g packet Take 17 g by mouth daily as needed for moderate constipation. Patient not taking: Reported on 07/10/2020 06/29/20   Alford Highland, MD  potassium chloride (KLOR-CON) 10 MEQ tablet Take 1 tablet (10 mEq total) by mouth daily. 06/29/20 07/29/20  Alford Highland, MD  VENTOLIN HFA 108 (90 Base) MCG/ACT inhaler Inhale 2 puffs into the lungs every 6 (six) hours as needed for wheezing or shortness of breath. Patient not taking: Reported on 07/10/2020 06/29/20    Alford Highland, MD  vitamin B-12 1000 MCG tablet Take 1 tablet (1,000 mcg total) by mouth daily. 06/30/20   Alford Highland, MD  isosorbide mononitrate (IMDUR) 60 MG 24 hr tablet Take 1 tablet (60 mg total) by mouth daily. 02/04/18 05/30/19  [name], MD    Physical Exam: Vitals:   07/23/20 1626 07/23/20 1627 07/23/20 1838 07/24/20 0814  BP: 128/64  112/76 97/73  Pulse:  (!) 102 95 91  Resp: 20  19 18   Temp: 97.8 F (36.6 C)  97.9 F (36.6 C)   TempSrc: Oral  Oral   SpO2:  95% 97% 96%  Weight:  83.9 kg    Height:  5\' 7"  (1.702 m)     General: Not in acute distress HEENT:       Eyes: PERRL, EOMI, no scleral icterus.       ENT: No discharge from the ears and nose, no pharynx injection, no tonsillar enlargement.        Neck: No JVD, no bruit, no mass felt. Heme: No neck lymph node enlargement. Cardiac: S1/S2, RRR, No  murmurs, No gallops or rubs. Respiratory: No rales, wheezing, rhonchi or rubs. GI: Soft, nondistended, nontender, no rebound pain, no organomegaly, BS present. GU: No hematuria Ext: has 1+ pitting leg edema bilaterally. Musculoskeletal: has tenderness and swelling in multiple joints, including bilateral knees, right elbow, bilateral ankles The right elbow is erythematous. Skin: No rashes.  Neuro: Alert, oriented X3, cranial nerves II-XII grossly intact, moves all extremities. Psych: Patient is not psychotic, no suicidal or hemocidal ideation.  Labs on Admission: I have personally reviewed following labs and imaging studies  CBC: Recent Labs  Lab 07/18/20 1455 07/23/20 1634  WBC 14.0* 14.3*  NEUTROABS 11.8*  --   HGB 10.8* 11.8*  HCT 34.0* 36.6*  MCV 122.7* 118.8*  PLT 145* 205   Basic Metabolic Panel: Recent Labs  Lab 07/18/20 1455 07/23/20 1634 07/24/20 0743  NA 136 136  --   K 3.8 3.2*  --   CL 98 99  --   CO2 23 28  --   GLUCOSE 104* 109*  --   BUN 20 22*  --   CREATININE 1.33* 1.20  --   CALCIUM 8.9 9.0  --   MG  --   --  1.8    GFR: Estimated Creatinine Clearance: 69.5 mL/min (by C-G formula based on SCr of 1.2 mg/dL). Liver Function Tests: Recent Labs  Lab 07/18/20 1455  AST 17  ALT 9  ALKPHOS 78  BILITOT 1.8*  PROT 7.9  ALBUMIN 3.3*   No results for input(s): LIPASE, AMYLASE in the last 168 hours. No results for input(s): AMMONIA in the last 168 hours. Coagulation Profile: No results for input(s): INR, PROTIME in the last 168 hours. Cardiac Enzymes: Recent Labs  Lab 07/24/20 0743  CKTOTAL 176   BNP (last 3 results) No results for input(s): PROBNP in the last 8760 hours. HbA1C: No results for input(s): HGBA1C in the last 72 hours. CBG: No results for input(s): GLUCAP in the last 168 hours. Lipid Profile: No results for input(s): CHOL, HDL, LDLCALC, TRIG, CHOLHDL, LDLDIRECT in the last 72 hours. Thyroid Function Tests: No results for input(s): TSH, T4TOTAL, FREET4, T3FREE, THYROIDAB in the last 72 hours. Anemia Panel: No results for input(s): VITAMINB12, FOLATE, FERRITIN, TIBC, IRON, RETICCTPCT in the last 72 hours. Urine analysis:    Component Value Date/Time   COLORURINE RED (A) 03/09/2017 1132   APPEARANCEUR TURBID (A) 03/09/2017 1132   APPEARANCEUR Hazy 11/20/2011 1343   LABSPEC 1.027 03/09/2017 1132   LABSPEC 1.026 11/20/2011 1343   PHURINE  03/09/2017 1132    TEST NOT REPORTED DUE TO COLOR INTERFERENCE OF URINE PIGMENT   GLUCOSEU (A) 03/09/2017 1132    TEST NOT REPORTED DUE TO COLOR INTERFERENCE OF URINE PIGMENT   GLUCOSEU Negative 11/20/2011 1343   HGBUR (A) 03/09/2017 1132    TEST NOT REPORTED DUE TO COLOR INTERFERENCE OF URINE PIGMENT   BILIRUBINUR (A) 03/09/2017 1132    TEST NOT REPORTED DUE TO COLOR INTERFERENCE OF URINE PIGMENT   BILIRUBINUR Negative 11/20/2011 1343   KETONESUR (A) 03/09/2017 1132    TEST NOT REPORTED DUE TO COLOR INTERFERENCE OF URINE PIGMENT   PROTEINUR (A) 03/09/2017 1132    TEST NOT REPORTED DUE TO COLOR INTERFERENCE OF URINE PIGMENT   NITRITE (A)  03/09/2017 1132    TEST NOT REPORTED DUE TO COLOR INTERFERENCE OF URINE PIGMENT   LEUKOCYTESUR (A) 03/09/2017 1132    TEST NOT REPORTED DUE TO COLOR INTERFERENCE OF URINE PIGMENT   LEUKOCYTESUR Negative 11/20/2011 1343  Sepsis Labs: @LABRCNTIP (procalcitonin:4,lacticidven:4) ) Recent Results (from the past 240 hour(s))  Resp Panel by RT-PCR (Flu A&B, Covid) Nasopharyngeal Swab     Status: None   Collection Time: 07/18/20  5:05 PM   Specimen: Nasopharyngeal Swab; Nasopharyngeal(NP) swabs in vial transport medium  Result Value Ref Range Status   SARS Coronavirus 2 by RT PCR NEGATIVE NEGATIVE Final    Comment: (NOTE) SARS-CoV-2 target nucleic acids are NOT DETECTED.  The SARS-CoV-2 RNA is generally detectable in upper respiratory specimens during the acute phase of infection. The lowest concentration of SARS-CoV-2 viral copies this assay can detect is 138 copies/mL. A negative result does not preclude SARS-Cov-2 infection and should not be used as the sole basis for treatment or other patient management decisions. A negative result may occur with  improper specimen collection/handling, submission of specimen other than nasopharyngeal swab, presence of viral mutation(s) within the areas targeted by this assay, and inadequate number of viral copies(<138 copies/mL). A negative result must be combined with clinical observations, patient history, and epidemiological information. The expected result is Negative.  Fact Sheet for Patients:  09/15/20  Fact Sheet for Healthcare Providers:  BloggerCourse.com  This test is no t yet approved or cleared by the [place] FDA and  has been authorized for detection and/or diagnosis of SARS-CoV-2 by FDA under an Emergency Use Authorization (EUA). This EUA will remain  in effect (meaning this test can be used) for the duration of the COVID-19 declaration under Section 564(b)(1) of the  Act, 21 U.S.C.section 360bbb-3(b)(1), unless the authorization is terminated  or revoked sooner.       Influenza A by PCR NEGATIVE NEGATIVE Final   Influenza B by PCR NEGATIVE NEGATIVE Final    Comment: (NOTE) The Xpert Xpress SARS-CoV-2/FLU/RSV plus assay is intended as an aid in the diagnosis of influenza from Nasopharyngeal swab specimens and should not be used as a sole basis for treatment. Nasal washings and aspirates are unacceptable for Xpert Xpress SARS-CoV-2/FLU/RSV testing.  Fact Sheet for Patients: [place]  Fact Sheet for Healthcare Providers: BloggerCourse.com  This test is not yet approved or cleared by the [place] FDA and has been authorized for detection and/or diagnosis of SARS-CoV-2 by FDA under an Emergency Use Authorization (EUA). This EUA will remain in effect (meaning this test can be used) for the duration of the COVID-19 declaration under Section 564(b)(1) of the Act, 21 U.S.C. section 360bbb-3(b)(1), unless the authorization is terminated or revoked.  Performed at [facility] [address] [place], [place]   Resp Panel by RT-PCR (Flu A&B, Covid) Nasopharyngeal Swab     Status: None   Collection Time: 07/24/20  7:43 AM   Specimen: Nasopharyngeal Swab; Nasopharyngeal(NP) swabs in vial transport medium  Result Value Ref Range Status   SARS Coronavirus 2 by RT PCR NEGATIVE NEGATIVE Final    Comment: (NOTE) SARS-CoV-2 target nucleic acids are NOT DETECTED.  The SARS-CoV-2 RNA is generally detectable in upper respiratory specimens during the acute phase of infection. The lowest concentration of SARS-CoV-2 viral copies this assay can detect is 138 copies/mL. A negative result does not preclude SARS-Cov-2 infection and should not be used as the sole basis for treatment or other patient management decisions. A negative result may occur with  improper specimen  collection/handling, submission of specimen other than nasopharyngeal swab, presence of viral mutation(s) within the areas targeted by this assay, and inadequate number of viral copies(<138 copies/mL). A negative result must be combined with clinical observations,  patient history, and epidemiological information. The expected result is Negative.  Fact Sheet for Patients:  BloggerCourse.com  Fact Sheet for Healthcare Providers:  [employer]ious[employer]ker.it  This test is no t yet approved or cleared by the [place] FDA and  has been authorized for detection and/or diagnosis of SARS-CoV-2 by FDA under an Emergency Use Authorization (EUA). This EUA will remain  in effect (meaning this test can be used) for the duration of the COVID-19 declaration under Section 564(b)(1) of the Act, 21 U.S.C.section 360bbb-3(b)(1), unless the authorization is terminated  or revoked sooner.       Influenza A by PCR NEGATIVE NEGATIVE Final   Influenza B by PCR NEGATIVE NEGATIVE Final    Comment: (NOTE) The Xpert Xpress SARS-CoV-2/FLU/RSV plus assay is intended as an aid in the diagnosis of influenza from Nasopharyngeal swab specimens and should not be used as a sole basis for treatment. Nasal washings and aspirates are unacceptable for Xpert Xpress SARS-CoV-2/FLU/RSV testing.  Fact Sheet for Patients: BloggerCourse.com  Fact Sheet for Healthcare Providers: [employer]ious[employer]ker.it  This test is not yet approved or cleared by the [place] FDA and has been authorized for detection and/or diagnosis of SARS-CoV-2 by FDA under an Emergency Use Authorization (EUA). This EUA will remain in effect (meaning this test can be used) for the duration of the COVID-19 declaration under Section 564(b)(1) of the Act, 21 U.S.C. section 360bbb-3(b)(1), unless the authorization is terminated or revoked.  Performed at [facility], [address], [place], [place] [address]      Radiological Exams on Admission: DG Chest 1 View  Result Date: 07/24/2020 CLINICAL DATA:  Right-sided chest pain EXAM: CHEST  1 VIEW COMPARISON:  July 18, 2020 FINDINGS: The heart size and mediastinal contours are mildly enlarged. There is prominence of the central pulmonary vasculature. No large airspace consolidation or pleural effusion. The visualized skeletal structures are unremarkable. IMPRESSION: Unchanged cardiomegaly and pulmonary vascular congestion Electronically Signed   By: Jonna Clark M.D.   On: 07/24/2020 03:34   US Venous Img Lower Bilateral (DVT)  Result Date: 07/24/2020 CLINICAL DATA:  Bilateral lower extremity pain and edema for the past 2 weeks. History of smoking. Patient is currently on anticoagulation. Evaluate for DVT. EXAM: BILATERAL LOWER EXTREMITY VENOUS DOPPLER ULTRASOUND TECHNIQUE: Gray-scale sonography with graded compression, as well as color Doppler and duplex ultrasound were performed to evaluate the lower extremity deep venous systems from the level of the common femoral vein and including the common femoral, femoral, profunda femoral, popliteal and calf veins including the posterior tibial, peroneal and gastrocnemius veins when visible. The superficial great saphenous vein was also interrogated. Spectral Doppler was utilized to evaluate flow at rest and with distal augmentation maneuvers in the common femoral, femoral and popliteal veins. COMPARISON:  Left lower extremity venous Doppler ultrasound-07/18/2020; 12/13/2018 (both negative) Right lower extremity venous Doppler ultrasound-03/05/2017 (negative) FINDINGS: RIGHT LOWER EXTREMITY Common Femoral Vein: No evidence of thrombus. Normal compressibility, respiratory phasicity and response to augmentation. Saphenofemoral Junction: No evidence of thrombus. Normal compressibility and flow on color Doppler imaging. Profunda Femoral Vein: No evidence of  thrombus. Normal compressibility and flow on color Doppler imaging. Femoral Vein: No evidence of thrombus. Normal compressibility, respiratory phasicity and response to augmentation. Popliteal Vein: No evidence of thrombus. Normal compressibility, respiratory phasicity and response to augmentation. Calf Veins: No evidence of thrombus. Normal compressibility and flow on color Doppler imaging. Superficial Great Saphenous Vein: No evidence of thrombus. Normal compressibility. Venous Reflux:  None. Other Findings: There  is a moderate amount of subcutaneous edema at the level of the right lower leg and calf (representative image 35). LEFT LOWER EXTREMITY Common Femoral Vein: No evidence of thrombus. Normal compressibility, respiratory phasicity and response to augmentation. Saphenofemoral Junction: No evidence of thrombus. Normal compressibility and flow on color Doppler imaging. Profunda Femoral Vein: No evidence of thrombus. Normal compressibility and flow on color Doppler imaging. Femoral Vein: No evidence of thrombus. Normal compressibility, respiratory phasicity and response to augmentation. Popliteal Vein: No evidence of thrombus. Normal compressibility, respiratory phasicity and response to augmentation. Calf Veins: No evidence of thrombus. Normal compressibility and flow on color Doppler imaging. Superficial Great Saphenous Vein: No evidence of thrombus. Normal compressibility. Venous Reflux:  None. Other Findings: There is a moderate amount of subcutaneous edema at the level of the left lower leg and calf (representative image 68). IMPRESSION: 1. No evidence of DVT within either lower extremity. 2. Moderate amount of subcutaneous edema at the level of the bilateral lower legs and calves. Electronically Signed   By: Simonne Come M.D.   On: 07/24/2020 09:52     EKG: I have personally reviewed.  Sinus rhythm, QTC 422, low voltage, poor R wave progression  Assessment/Plan Principal Problem:   Acute gouty  arthritis Active Problems:   Essential hypertension   Hyperlipidemia   CAD (coronary artery disease), native coronary artery   Hypokalemia   Tobacco abuse   Apical mural thrombus   PVD (peripheral vascular disease) (HCC)   CKD (chronic kidney disease), stage II   Chronic systolic CHF (congestive heart failure) (HCC)   Atrial fibrillation, chronic (HCC)   Iron deficiency anemia   Leukocytosis   Acute gouty arthritis: Patient has pain and swelling in multiple joints, elevated uric acid level 8.8, clinically consistent with acute gouty arthritis.  Dr. Odis Luster of orthopedic surgery is consulted.  He evaluated patient, thinks this is most likely due to gout flare rather than septic joint.  -Admitted to MedSurg bed as inpatient -Highly appreciated Dr. Odis Luster consultation -Start Solu-Medrol 40 mg daily tomorrow (patient received 60 mg in ED) -As needed Tylenol and Percocet for pain -PT/OT  Essential hypertension -IV hydralazine as needed -Coreg, lisinopril  Hyperlipidemia -Lipitor  CAD (coronary artery disease), native coronary artery -Continue Lipitor, Coreg, as needed nitroglycerin  Hypokalemia: Potassium is 3.2 -Repleted potassium -Check magnesium level  Tobacco abuse -Nicotine patch  Apical mural thrombus -Continue Eliquis  PVD (peripheral vascular disease) (HCC): has bilateral dopplerable PT and DP pulses. -Continue Lipitor  CKD (chronic kidney disease), stage II: Stable -Follow-up by BMP  Chronic systolic CHF (congestive heart failure) (HCC): 2D echo 02/17/2020 showed EF of 20 to 25%.  Patient has 1+ leg edema, but in no respiratory distress, no oxygen desaturation.  Chest x-ray showed cardiomegaly and vascular congestion, but does not seem to have a CHF exacerbation -Continue home Lasix  Atrial fibrillation, chronic (HCC) -Continue Eliquis and Coreg  Iron deficiency anemia: currently hemoglobin stable, 11.8 -Continue iron supplement  Leukocytosis: WBC 14.3,  temperature normal.  Likely reactive -Follow-up with CBC    DVT ppx: on Eliquis Code Status: Full code Family Communication: not done, no family member is at bed side.    Disposition Plan:  Anticipate discharge back to previous environment Consults called:  Dr.Bowers of ortho Admission status: Med-surg bed as inpt       Status is: Inpatient  Remains inpatient appropriate because:Inpatient level of care appropriate due to severity of illness.  Patient has multiple comorbidities, now presents with acute  gouty arthritis.  The symptoms involves multiple joints, severe. Patient is unable to walk.  His presentation is highly complicated.  Patient will need to be treated in the hospital for the distal history   Dispo: The patient is from: Home              Anticipated d/c is to: Home              Anticipated d/c date is: 2 days              Patient currently is not medically stable to d/c.          Date of Service 07/24/2020    Lorretta Harp [facility]   If 7PM-7AM, please contact night-coverage [email or web address] 07/24/2020, 11:05 AM

## 2020-07-24 NOTE — ED Notes (Signed)
Pt transported to the floor at this time by EDT Vernona Rieger

## 2020-07-25 DIAGNOSIS — M109 Gout, unspecified: Secondary | ICD-10-CM | POA: Diagnosis not present

## 2020-07-25 LAB — BASIC METABOLIC PANEL
Anion gap: 11 (ref 5–15)
BUN: 29 mg/dL — ABNORMAL HIGH (ref 6–20)
CO2: 28 mmol/L (ref 22–32)
Calcium: 9.1 mg/dL (ref 8.9–10.3)
Chloride: 99 mmol/L (ref 98–111)
Creatinine, Ser: 1.07 mg/dL (ref 0.61–1.24)
GFR, Estimated: >60 mL/min (ref >60)
Glucose, Bld: 125 mg/dL — ABNORMAL HIGH (ref 70–99)
Potassium: 4 mmol/L (ref 3.5–5.1)
Sodium: 138 mmol/L (ref 135–145)

## 2020-07-25 LAB — CBC
HCT: 34.1 % — ABNORMAL LOW (ref 39.0–52.0)
Hemoglobin: 10.7 g/dL — ABNORMAL LOW (ref 13.0–17.0)
MCH: 37.4 pg — ABNORMAL HIGH (ref 26.0–34.0)
MCHC: 31.4 g/dL (ref 30.0–36.0)
MCV: 119.2 fL — ABNORMAL HIGH (ref 80.0–100.0)
Platelets: 190 10*3/uL (ref 150–400)
RBC: 2.86 MIL/uL — ABNORMAL LOW (ref 4.22–5.81)
RDW: 16.2 % — ABNORMAL HIGH (ref 11.5–15.5)
WBC: 9.8 10*3/uL (ref 4.0–10.5)
nRBC: 0 % (ref 0.0–0.2)

## 2020-07-25 MED ORDER — COLCHICINE 0.6 MG PO TABS
0.6000 mg | ORAL_TABLET | Freq: Once | ORAL | Status: AC
  Administered 2020-07-25: 0.6 mg via ORAL
  Filled 2020-07-25: qty 1

## 2020-07-25 MED ORDER — FUROSEMIDE 40 MG PO TABS: 40.0000 mg | ORAL_TABLET | Freq: Two times a day (BID) | ORAL | Status: DC

## 2020-07-25 MED ORDER — ASCORBIC ACID 500 MG PO TABS
500.0000 mg | ORAL_TABLET | Freq: Every day | ORAL | Status: DC
  Administered 2020-07-25 – 2020-07-26 (×2): 500 mg via ORAL
  Filled 2020-07-25 (×2): qty 1

## 2020-07-25 MED ORDER — FOLIC ACID 1 MG PO TABS
1.0000 mg | ORAL_TABLET | Freq: Every day | ORAL | Status: DC
  Administered 2020-07-25 – 2020-07-26 (×2): 1 mg via ORAL
  Filled 2020-07-25 (×2): qty 1

## 2020-07-25 MED ORDER — MAGNESIUM OXIDE 400 (241.3 MG) MG PO TABS
400.0000 mg | ORAL_TABLET | Freq: Every day | ORAL | Status: DC
  Administered 2020-07-25 – 2020-07-26 (×2): 400 mg via ORAL
  Filled 2020-07-25 (×2): qty 1

## 2020-07-25 MED ORDER — ZOLPIDEM TARTRATE 5 MG PO TABS
5.0000 mg | ORAL_TABLET | Freq: Every evening | ORAL | Status: DC | PRN
  Administered 2020-07-25 (×2): 5 mg via ORAL
  Filled 2020-07-25 (×2): qty 1

## 2020-07-25 MED ORDER — FUROSEMIDE 10 MG/ML IJ SOLN
40.0000 mg | Freq: Two times a day (BID) | INTRAMUSCULAR | Status: DC
  Administered 2020-07-25 – 2020-07-26 (×3): 40 mg via INTRAVENOUS
  Filled 2020-07-25 (×3): qty 4

## 2020-07-25 NOTE — Consult Note (Addendum)
   Heart Failure Nurse Navigator Note  HFrEF 20-25% ventricular systolic function is normal.  Moderately elevated pulmonary systolic pressure.  Mildly dilated left atria.  Mild to moderate mitral regurgitation.   He presented to the emergency room with complaints of 3 to 4-day history of  joint pain and swelling. BNP 1973.  Chest x-ray revealed cardiomegaly with vascular congestion.  Comorbidities:  Gout Coronary artery disease/stent Hypertension Hyperlipidemia Atrial fibrillation   He also has a history of peripheral vascular disease with stenting and apical mural thrombus.   Medications:  Eliquis 5 mg 2 times a day Lipitor 80 mg daily Coreg 3.125 mg 2 times a day Lasix 40 mg 2 times a day IV Zestril 20 mg daily   Labs: Sodium 138, potassium 4, chloride 99, CO2 28, BUN 29, creatinine 1.07, WBC 9.8, hemoglobin 10.7, hematocrit 34.1 Blood pressure 92/69 BMI 28.9 Weight 83.9 kg    Assessment:  General-he is awake and alert sitting on the edge of the bed in no acute distress   HEENT-pupils are equal, normocephalic.  Cardiac-heart tones are irregular.  No murmurs rubs or gallops appreciated   Chest- lungs are clear to posterior auscultation  Musculoskeletal- trace to 1+ edema lower extremities.  Psych-is pleasant and appropriate makes good eye contact.  Neurologic-speech is clear, moves all extremities.    Initial visit with patient.  He states that he does not weigh himself on a daily basis but does weigh himself when he thinks of it first thing in the morning after going to the bathroom.  He states if he notices weight gain then he cuts back on the amount of fluids that he is taking in his weight will level out.  Talked about being more consistent weighing on a daily basis and if he notices a 2 to 3 pound weight gain or 5 pounds within a week to get on the phone with his provider for instructions.  Also discussed a low-sodium diet.  He lives by himself and  does the cooking.  He tries to be very conscious with his diet and sticking with low-sodium foods.  He eats chicken, fish states he loves fruits and vegetables.  Buys all cuts from the deli meat sliced [place] sandwiches.  Discussed being more compliant with his visits to the heart failure outpatient clinic.  States that usually transportation is a problem as to why he cannot get therapy.   Given heart failure teaching booklet along with his own magnet and information about the heart failure clinic.  Tresa Endo RN [facility]

## 2020-07-25 NOTE — Progress Notes (Signed)
[facility] Progress Note  Patient: Cameron [name]    ASN:[id number]  DOA: 07/24/2020     Date of Service: the patient was seen and examined on 07/25/2020  Chief Complaint  Patient presents with  . Leg Pain   Brief hospital course: Cameron [name] is a 60 y.o. male with medical history significant of hypertension, hyperlipidemia, gout, atrial fibrillation and apical mural thrombosis on Eliquis, tobacco abuse, PVD (s/p of stent placement to left leg by Dr. Wyn Quaker 06/2020), CAD, STEMI, CHF with EF 20- 25%, CKD-2, who presented with leg pain.  Patient states that he has history of colchicine, but stopped taking his colchicine.  Patient states that because he is taking Eliquis, his cardiologist told him to stop taking colchicine due to possible drug interaction. He developed pain in multiple joints, including bilateral knees, right elbow, bilateral ankles, the right ankle is most painful per pt. The right elbow is erythematous. Symptoms have been going on for about 2 weeks, which has been progressively worsening.  The pain is constant, moderate to severe, sharp, nonradiating.  Patient denies fever or chills. Pt has been unable to walk normally due to severe pain in the past 4 days.  Patient does not have chest pain, shortness of breath, cough, nausea, vomiting, diarrhea, abdominal pain, symptoms of UTI.  No unilateral numbness or tingling of extremities.  Per EDP,  Pt has bilateral dopplerable PT and DP pulses.   ED Course: pt was found to have WBC 14.3, uric acid 8.8, CK1 76, pending COVID-19 PCR, stable renal function, BMP 1973, temperature normal, blood pressure 97/73, heart rate 102, 91, oxygen saturation 97% on room air, chest x-ray showed cardiomegaly and vascular congestion.  Lower extremity venous Doppler negative for DVT.  Patient is admitted to MedSurg bed as inpatient  Assessment and Plan: Principal Problem:   Acute gouty arthritis Active Problems:   Essential hypertension    Hyperlipidemia   CAD (coronary artery disease), native coronary artery   Hypokalemia   Tobacco abuse   Apical mural thrombus   PVD (peripheral vascular disease) (HCC)   CKD (chronic kidney disease), stage II   Chronic systolic CHF (congestive heart failure) (HCC)   Atrial fibrillation, chronic (HCC)   Iron deficiency anemia   Leukocytosis   Acute gouty arthritis: Patient has pain and swelling in multiple joints, elevated uric acid level 8.8, clinically consistent with acute gouty arthritis.  Dr. Odis Luster of orthopedic surgery is consulted.  He evaluated patient, thinks this is most likely due to gout flare rather than septic joint. -Highly appreciated Dr. Odis Luster consultation -Start Solu-Medrol 40 mg daily tomorrow (patient received 60 mg in ED) -Colchicine 0.6 mg x 1 dose given -As needed Tylenol and Percocet for pain -PT/OT  Chronic systolic CHF (congestive heart failure) (HCC): 2D echo 02/17/2020 showed EF of 20 to 25%.  Patient has 1+ leg edema, but in no respiratory distress, no oxygen desaturation.  Chest x-ray showed cardiomegaly and vascular congestion, but does not seem to have a CHF exacerbation -1/19 started Lasix 40 mg IV twice daily to significant lower extremity edema  Essential hypertension -IV hydralazine as needed -Coreg, lisinopril  Hyperlipidemia -Lipitor  CAD (coronary artery disease), native coronary artery -Continue Lipitor, Coreg, as needed nitroglycerin  Hypokalemia: Potassium is 3.2 -Repleted potassium -magnesium level 1.8, started oral supplement  Tobacco abuse -Nicotine patch  Apical mural thrombus -Continue Eliquis  PVD (peripheral vascular disease) (HCC): has bilateral dopplerable PT and DP pulses. -Continue Lipitor   Atrial fibrillation, chronic (  HCC) -Continue Eliquis and Coreg  CKD (chronic kidney disease), stage II: Stable -Follow-up by BMP   Iron deficiency anemia: currently hemoglobin stable, 11.8 -Continue iron  supplement  Leukocytosis: WBC 14.3, temperature normal.  Likely reactive -Follow-up with CBC  Iron deficiency, folate deficiency and B12 deficiency Continued oral supplement   Body mass index is 28.98 kg/m.       Pressure Injury 07/24/20 Sacrum Stage 1 -  Intact skin with non-blanchable redness of a localized area usually over a bony prominence. (Active)  07/24/20 4920  Location: Sacrum  Location Orientation:   Staging: Stage 1 -  Intact skin with non-blanchable redness of a localized area usually over a bony prominence.  Wound Description (Comments):   Present on Admission: Yes     Diet: Heart healthy DVT Prophylaxis: Therapeutic Anticoagulation with Eliquis   Advance goals of care discussion: Full code  Family Communication: family was present at bedside, at the time of interview.  The pt provided permission to discuss medical plan with the family. Opportunity was given to ask question and all questions were answered satisfactorily.   Disposition:  Pt is from Home, admitted with pain in bilateral lower extremities, still has significant pain in bilateral lower extremities, difficulty ambulation, which precludes a safe discharge. Discharge to home, when pain and lower extremity edema will improve most likely in 1 to 2 days.  Subjective: No significant overnight events, patient admitted with severe intractable bilateral lower extremity pain and edema, patient still has significant pain unable to ambulate.  Started colchicine x1 dose and Lasix switched from p.o. to IV We will reassess tomorrow a.m.   Physical Exam: General:  alert oriented to time, place, and person.  Appear in mild distress, affect appropriate Eyes: PERRLA ENT: Oral Mucosa Clear, moist  Neck: no JVD,  Cardiovascular: S1 and S2 Present, no Murmur,  Respiratory: good respiratory effort, Bilateral Air entry equal and Decreased, no Crackles, no wheezes Abdomen: Bowel Sound present, Soft and no tenderness,   Skin: no rashes Extremities:  3-4 +Pedal edema, no calf tenderness Neurologic: without any new focal findings Gait not checked due to patient safety concerns  Vitals:   07/25/20 0500 07/25/20 0522 07/25/20 0847 07/25/20 1203  BP:  102/77 122/83 92/69  Pulse:  73 81 74  Resp:  19 18 18   Temp:  (!) 97.5 F (36.4 C) 97.7 F (36.5 C) 97.7 F (36.5 C)  TempSrc:  Oral    SpO2:  90% 95% 92%  Weight: 83.9 kg     Height:        Intake/Output Summary (Last 24 hours) at 07/25/2020 1518 Last data filed at 07/25/2020 1013 Gross per 24 hour  Intake 600 ml  Output 850 ml  Net -250 ml   Filed Weights   07/23/20 1627 07/24/20 1149 07/25/20 0500  Weight: 83.9 kg 83.9 kg 83.9 kg    Data Reviewed: I have personally reviewed and interpreted daily labs, tele strips, imagings as discussed above. I reviewed all nursing notes, pharmacy notes, vitals, pertinent old records I have discussed plan of care as described above with RN and patient/family.  CBC: Recent Labs  Lab 07/23/20 1634 07/25/20 0553  WBC 14.3* 9.8  HGB 11.8* 10.7*  HCT 36.6* 34.1*  MCV 118.8* 119.2*  PLT 205 190   Basic Metabolic Panel: Recent Labs  Lab 07/23/20 1634 07/24/20 0743 07/25/20 0553  NA 136  --  138  K 3.2*  --  4.0  CL 99  --  99  CO2 28  --  28  GLUCOSE 109*  --  125*  BUN 22*  --  29*  CREATININE 1.20  --  1.07  CALCIUM 9.0  --  9.1  MG  --  1.8  --     Studies: No results found.  Scheduled Meds: . apixaban  5 mg Oral BID  . atorvastatin  80 mg Oral Daily  . carvedilol  3.125 mg Oral BID WC  . ferrous sulfate  325 mg Oral Q breakfast  . furosemide  40 mg Intravenous BID  . lisinopril  20 mg Oral Daily  . methylPREDNISolone (SOLU-MEDROL) injection  40 mg Intravenous Daily  . nicotine  21 mg Transdermal Daily  . potassium chloride  10 mEq Oral Daily  . cyanocobalamin  1,000 mcg Oral Daily   Continuous Infusions: PRN Meds: acetaminophen, albuterol, guaiFENesin **AND** dextromethorphan,  hydrALAZINE, nitroGLYCERIN, ondansetron (ZOFRAN) IV, oxyCODONE-acetaminophen, senna-docusate, zolpidem  Time spent: 35 minutes  Author: Gillis Santa. MD [facility] 07/25/2020 3:18 PM  To reach On-call, see care teams to locate the attending and reach out to them via [email or web address]. If 7PM-7AM, please contact night-coverage If you still have difficulty reaching the attending provider, please page the [facility] (Director on Call) for [facility] on amion for assistance.

## 2020-07-25 NOTE — Plan of Care (Signed)
  Problem: Education: Goal: Knowledge of General Education information will improve Description: Including pain rating scale, medication(s)/side effects and non-pharmacologic comfort measures 07/25/2020 1222 by Ansel Bong, RN Outcome: Progressing 07/25/2020 1222 by Ansel Bong, RN Outcome: Progressing   Problem: Health Behavior/Discharge Planning: Goal: Ability to manage health-related needs will improve 07/25/2020 1222 by Ansel Bong, RN Outcome: Progressing 07/25/2020 1222 by Ansel Bong, RN Outcome: Progressing   Problem: Clinical Measurements: Goal: Ability to maintain clinical measurements within normal limits will improve 07/25/2020 1222 by Ansel Bong, RN Outcome: Progressing 07/25/2020 1222 by Ansel Bong, RN Outcome: Progressing Goal: Will remain free from infection 07/25/2020 1222 by Ansel Bong, RN Outcome: Progressing 07/25/2020 1222 by Ansel Bong, RN Outcome: Progressing Goal: Diagnostic test results will improve 07/25/2020 1222 by Ansel Bong, RN Outcome: Progressing 07/25/2020 1222 by Ansel Bong, RN Outcome: Progressing Goal: Respiratory complications will improve 07/25/2020 1222 by Ansel Bong, RN Outcome: Progressing 07/25/2020 1222 by Ansel Bong, RN Outcome: Progressing Goal: Cardiovascular complication will be avoided 07/25/2020 1222 by Ansel Bong, RN Outcome: Progressing 07/25/2020 1222 by Ansel Bong, RN Outcome: Progressing   Problem: Activity: Goal: Risk for activity intolerance will decrease 07/25/2020 1222 by Ansel Bong, RN Outcome: Progressing 07/25/2020 1222 by Ansel Bong, RN Outcome: Progressing   Problem: Nutrition: Goal: Adequate nutrition will be maintained 07/25/2020 1222 by Ansel Bong, RN Outcome: Progressing 07/25/2020 1222 by Ansel Bong, RN Outcome: Progressing   Problem: Coping: Goal: Level of anxiety will decrease 07/25/2020 1222 by Ansel Bong, RN Outcome:  Progressing 07/25/2020 1222 by Ansel Bong, RN Outcome: Progressing   Problem: Elimination: Goal: Will not experience complications related to bowel motility 07/25/2020 1222 by Ansel Bong, RN Outcome: Progressing 07/25/2020 1222 by Ansel Bong, RN Outcome: Progressing Goal: Will not experience complications related to urinary retention 07/25/2020 1222 by Ansel Bong, RN Outcome: Progressing 07/25/2020 1222 by Ansel Bong, RN Outcome: Progressing   Problem: Pain Managment: Goal: General experience of comfort will improve 07/25/2020 1222 by Ansel Bong, RN Outcome: Progressing 07/25/2020 1222 by Ansel Bong, RN Outcome: Progressing   Problem: Safety: Goal: Ability to remain free from injury will improve 07/25/2020 1222 by Ansel Bong, RN Outcome: Progressing 07/25/2020 1222 by Ansel Bong, RN Outcome: Progressing   Problem: Skin Integrity: Goal: Risk for impaired skin integrity will decrease 07/25/2020 1222 by Ansel Bong, RN Outcome: Progressing 07/25/2020 1222 by Ansel Bong, RN Outcome: Progressing

## 2020-07-26 DIAGNOSIS — M109 Gout, unspecified: Secondary | ICD-10-CM | POA: Diagnosis not present

## 2020-07-26 LAB — CBC
HCT: 33.7 % — ABNORMAL LOW (ref 39.0–52.0)
Hemoglobin: 10.5 g/dL — ABNORMAL LOW (ref 13.0–17.0)
MCH: 36.8 pg — ABNORMAL HIGH (ref 26.0–34.0)
MCHC: 31.2 g/dL (ref 30.0–36.0)
MCV: 118.2 fL — ABNORMAL HIGH (ref 80.0–100.0)
Platelets: 204 10*3/uL (ref 150–400)
RBC: 2.85 MIL/uL — ABNORMAL LOW (ref 4.22–5.81)
RDW: 16.2 % — ABNORMAL HIGH (ref 11.5–15.5)
WBC: 10.9 10*3/uL — ABNORMAL HIGH (ref 4.0–10.5)
nRBC: 0 % (ref 0.0–0.2)

## 2020-07-26 LAB — PHOSPHORUS: Phosphorus: 3.4 mg/dL (ref 2.5–4.6)

## 2020-07-26 LAB — BASIC METABOLIC PANEL
Anion gap: 8 (ref 5–15)
BUN: 38 mg/dL — ABNORMAL HIGH (ref 6–20)
CO2: 30 mmol/L (ref 22–32)
Calcium: 8.7 mg/dL — ABNORMAL LOW (ref 8.9–10.3)
Chloride: 99 mmol/L (ref 98–111)
Creatinine, Ser: 1.18 mg/dL (ref 0.61–1.24)
GFR, Estimated: >60 mL/min (ref >60)
Glucose, Bld: 113 mg/dL — ABNORMAL HIGH (ref 70–99)
Potassium: 4.3 mmol/L (ref 3.5–5.1)
Sodium: 137 mmol/L (ref 135–145)

## 2020-07-26 LAB — MAGNESIUM: Magnesium: 1.9 mg/dL (ref 1.7–2.4)

## 2020-07-26 MED ORDER — ALLOPURINOL 100 MG PO TABS: 100.0000 mg | ORAL_TABLET | Freq: Every day | ORAL | 11 refills | Status: DC

## 2020-07-26 MED ORDER — HYDROCODONE-ACETAMINOPHEN 5-325 MG PO TABS: 1.0000 | ORAL_TABLET | Freq: Four times a day (QID) | ORAL | 0 refills | Status: DC | PRN

## 2020-07-26 MED ORDER — COLCHICINE 0.6 MG PO TABS: 0.6000 mg | ORAL_TABLET | Freq: Every day | ORAL | 0 refills | Status: DC | PRN

## 2020-07-26 MED ORDER — ASCORBIC ACID 500 MG PO TABS: 500.0000 mg | ORAL_TABLET | Freq: Every day | ORAL | 0 refills | Status: DC

## 2020-07-26 MED ORDER — PREDNISONE 50 MG PO TABS: 50.0000 mg | ORAL_TABLET | Freq: Every day | ORAL | 0 refills | Status: AC

## 2020-07-26 MED ORDER — FOLIC ACID 1 MG PO TABS: 1.0000 mg | ORAL_TABLET | Freq: Every day | ORAL | 0 refills | Status: DC

## 2020-07-26 NOTE — Progress Notes (Signed)
Occupational Therapy Treatment Patient Details Name: Cameron [name] MRN: [id number] DOB: 05-Jun-1961 Today's Date: 07/26/2020    History of present illness 60 y.o. male with a history of CHF with EF 20-25%, PVD on Eliquis s/p LLE angiography and stenting in 06/2020, gout, CAD, HTN, HLD here with b/l leg pain. Symptoms started 2 weeks ago and became progressively worse; unable to ambulate for the 4 days prior to admission.  Likely gout flare (effecting many joints has not been taking gout meds recently.   OT comments  Cameron [name] made good progress towards his goals today and reports much reduced pain. He was able to engage in fxl mobility tasks, including toileting, grooming, bed mobility, transfers, with Mod I. He ambulated in the room with RW, no LOB, and w/o increased pain in LE. He reports feeling confident that he can safely maneuver and complete all necessary ADL/IADL in his home and has a plan in place for what to do if he needs assistance. Recommend D/C with HHOT to maximize pt's mobility and return to PLOF.   Follow Up Recommendations  Home health OT;Supervision - Intermittent    Equipment Recommendations       Recommendations for Other Services      Precautions / Restrictions Precautions Precautions: Fall Restrictions Weight Bearing Restrictions: No       Mobility Bed Mobility Overal bed mobility: Modified Independent             General bed mobility comments: Pt able to get up to sitting and back to supine w/o direct assist  Transfers Overall transfer level: Modified independent Equipment used: Rolling walker (2 wheeled)             General transfer comment: required increased time but no physical assistance    Balance                                           ADL either performed or assessed with clinical judgement   ADL Overall ADL's : Needs assistance/impaired Eating/Feeding: Independent   Grooming: Modified  independent;Supervision/safety                   Toilet Transfer: Modified Independent;RW   Toileting- Clothing Manipulation and Hygiene: Modified independent               Vision Patient Visual Report: No change from baseline     Perception     Praxis      Cognition Arousal/Alertness: Awake/alert Behavior During Therapy: WFL for tasks assessed/performed Overall Cognitive Status: Within Functional Limits for tasks assessed                                 General Comments: seemed slightly confused and anxious; asked same questions several times        Exercises Other Exercises Other Exercises: toileting, grooming, bed mobility, sup<>sit, sit<>stand, sitting/standing balance tolerance, ambulation w/in room   Shoulder Instructions       General Comments      Pertinent Vitals/ Pain       Pain Assessment: 0-10 Pain Score: 8  Pain Location: general gout flair pain, worst in LLE (8/10). Also in R elbow, R pinky finger, RLE, shoulders Pain Descriptors / Indicators: Aching;Tender Pain Intervention(s): Limited activity within patient's tolerance;Monitored during session  Home Living  Prior Functioning/Environment              Frequency  Min 2X/week        Progress Toward Goals  OT Goals(current goals can now be found in the care plan section)  Progress towards OT goals: Progressing toward goals  Acute Rehab OT Goals Patient Stated Goal: Go home, get gout under control OT Goal Formulation: With patient Time For Goal Achievement: 08/07/20 Potential to Achieve Goals: Good  Plan Discharge plan remains appropriate;Frequency remains appropriate    Co-evaluation                 AM-PAC OT "6 Clicks" Daily Activity     Outcome Measure   Help from another person eating meals?: None Help from another person taking care of personal grooming?: None Help from another person  toileting, which includes using toliet, bedpan, or urinal?: A Little Help from another person bathing (including washing, rinsing, drying)?: A Little Help from another person to put on and taking off regular upper body clothing?: None Help from another person to put on and taking off regular lower body clothing?: A Little 6 Click Score: 21    End of Session Equipment Utilized During Treatment: Rolling walker  OT Visit Diagnosis: Other abnormalities of gait and mobility (R26.89);Muscle weakness (generalized) (M62.81)   Activity Tolerance Patient tolerated treatment well   Patient Left in bed;with call bell/phone within reach   Nurse Communication          Time: 3335-4562 OT Time Calculation (min): 23 min  Charges: OT General Charges $OT Visit: 1 Visit OT Treatments $Self Care/Home Management : 23-37 mins  Latina Craver, PhD, MS, OTR/L ascom [phone] 07/26/20, 11:15 AM

## 2020-07-26 NOTE — Discharge Summary (Signed)
[facility] Discharge Summary   Patient: Cameron [name] ZOX:[id number]  PCP: [facility]  Date of admission: 07/24/2020   Date of discharge:  07/26/2020     Discharge Diagnoses:  Principal Problem:   Acute gouty arthritis Active Problems:   Essential hypertension   Hyperlipidemia   CAD (coronary artery disease), native coronary artery   Hypokalemia   Tobacco abuse   Apical mural thrombus   PVD (peripheral vascular disease) (HCC)   CKD (chronic kidney disease), stage II   Chronic systolic CHF (congestive heart failure) (HCC)   Atrial fibrillation, chronic (HCC)   Iron deficiency anemia   Leukocytosis   Admitted From: Home Disposition:  Home with HHPT  Recommendations for Outpatient Follow-up:  1. PCP: in 1 wk 2. Follow up LABS/TEST:  Uric acid level after 1 month   Follow-up Information    [facility] Follow up on 08/03/2020.   Specialty: Cardiology Why: at 2:00pm. Enter through the [facility] Mall entrance Contact information: [address] [place] [address] [phone]             Diet recommendation: Cardiac diet  Activity: The patient is advised to gradually reintroduce usual activities, as tolerated  Discharge Condition: stable  Code Status: Full code   History of present illness: As per the H and P dictated on admission Hospital Course:  EDGAR REISZ a 60 y.o.malewith medical history significant ofhypertension, hyperlipidemia, gout, atrial fibrillation and apical mural thrombosis on Eliquis, tobacco abuse, PVD (s/p ofstent placement to left leg by Dr. Dew12/2021), CAD, STEMI, CHF with EF 20-25%, CKD-2, who presented with leg pain. Patient states that he has history of colchicine,butstopped takinghis colchicine.Patient states that because he is taking Eliquis, his cardiologist told him to stop taking colchicine due to possible drug interaction.He  developed painin multiple joints, including bilateral knees, right elbow, bilateral ankles,the right ankle is most painfulper pt.The right elbow is erythematous.Symptoms have been going on for about 2 weeks, which has been progressively worsening.The pain is constant, moderate to severe, sharp, nonradiating. Patient denies fever or chills. Pt has been unable to walk normallydue to severe painin the past 4 days. Patient does not have chest pain, shortness of breath, cough, nausea, vomiting, diarrhea, abdominal pain, symptoms of UTI. No unilateral numbness or tingling of extremities. Per EDP, Pt has bilateraldopplerable PT and DP pulses. ED Course:pt was found to have WBC 14.3, uric acid 8.8, CK1 76, pending COVID-19 PCR, stable renal function, BMP 1973, temperature normal, blood pressure 97/73, heart rate 102, 91, oxygen saturation 97% on room air, chest x-ray showed cardiomegaly and vascular congestion. Lower extremity venous Doppler negative for DVT. Patient is admitted to MedSurg bed as inpatient Assessment and Plan: Principal Problem: Acute gouty arthritis Active Problems: Essential hypertension Hyperlipidemia CAD (coronary artery disease), native coronary artery Hypokalemia Tobacco abuse Apical mural thrombus PVD (peripheral vascular disease) (HCC) CKD (chronic kidney disease), stage II Chronic systolic CHF (congestive heart failure) (HCC) Atrial fibrillation, chronic (HCC) Iron deficiency anemia Leukocytosis  Acute gouty arthritis:Patienthaspain and swelling in multiple joints, elevated uric acid level 8.8, clinically consistent with acute gouty arthritis. Dr. Odis Luster of orthopedic surgery is consulted. He evaluated patient, thinks this is most likely due to gout flare rather than septic joint. S/p Solu-Medrol 60 mg received in the ED, 40 mg IV daily given during hospital stay, discharged on prednisone 50 mg p.o. daily for 5 days.  Colchicine  0.6 mg x 1 dose given,  advised to continue colchicine for 5 additional days and start allopurinol 100 mg p.o. daily for prophylaxis and follow with PCP for further titration of allopurinol.  Repeat uric acid level after 1 month.  Continue Tylenol as needed for pain control. Chronic systolic CHF (congestive heart failure) (HCC): 2D echo 02/17/2020 showed EF of 20 to 25%. Patient has 1+ leg edema, but in no respiratory distress, no oxygen desaturation. Chest x-ray showed cardiomegaly and vascular congestion, but does not seem to have a CHF exacerbation. On 1/19 s/p Lasix 40 mg IV twice daily to significant lower extremity edema.  Recommended to continue home medications and continue fluid restriction one-point Falta per day.  Follow with PCP and cardiology. Essential hypertension, -Coreg,lisinopril Hyperlipidemia, Lipitor CAD (coronary artery disease), native coronary artery, Lipitor, Coreg,  Hypokalemia: Potassium is 3.2, Repleted potassium, magnesium level 1.8, s/p oral supplement Tobacco abuse, s/p Nicotine patch Apical mural thrombus, Continue Eliquis PVD (peripheral vascular disease) (HCC):has bilateraldopplerable PT and DP pulses. Continue Lipitor Atrial fibrillation, chronic, Continue Eliquis and Coreg CKD (chronic kidney disease), stage BH:ALPFXT Iron deficiency anemia:currently hemoglobin stable, 11.8, Continue iron supplement with vitamin C Leukocytosis: WBC 14.3, temperature normal. Likely reactive, follow with PCP and repeat CBC after 1 to 2 weeks of steroids Iron deficiency, folate deficiency and B12 deficiency, Continued oral supplement Body mass index is 28.98 kg/m.   Overall patient's condition remained stable, medically optimized and discharged home with home PT.  Patient agreed with the discharge planning. Percocet was prescribed but I did not print out and patient already left the hospital so advised to follow with PCP for pain control.   Pressure Injury 07/24/20 Sacrum  Stage 1 -  Intact skin with non-blanchable redness of a localized area usually over a bony prominence. (Active)  07/24/20 0240  Location: Sacrum  Location Orientation:   Staging: Stage 1 -  Intact skin with non-blanchable redness of a localized area usually over a bony prominence.  Wound Description (Comments):   Present on Admission: Yes      Patient was ambulatory without any assistance. Patient was seen by physical therapy, who recommended Home health, which was arranged. On the day of the discharge the patient's vitals were stable, and no other acute medical condition were reported by patient. the patient was felt safe to be discharge at Home with Home health.  Consultants: Orthopedics Procedures: None  Discharge Exam: General: Appear in mild distress, no Rash; Oral Mucosa Clear, moist. Cardiovascular: S1 and S2 Present, no Murmur, Respiratory: normal respiratory effort, Bilateral Air entry present and n Crackles, no wheezes Abdomen: Bowel Sound present, Soft and no tenderness, no hernia Extremities: mild Pedal edema, no calf tenderness Neurology: alert and oriented to time, place, and person affect appropriate.  Filed Weights   07/23/20 1627 07/24/20 1149 07/25/20 0500  Weight: 83.9 kg 83.9 kg 83.9 kg   Vitals:   07/26/20 0427 07/26/20 1210  BP: 113/84 (!) 130/96  Pulse: 77 80  Resp: 16 13  Temp: 98 F (36.7 C) (!) 97.5 F (36.4 C)  SpO2: 97% 96%    DISCHARGE MEDICATION: Allergies as of 07/26/2020   No Known Allergies     Medication List    TAKE these medications   allopurinol 100 MG tablet Commonly known as: Zyloprim Take 1 tablet (100 mg total) by mouth daily.   apixaban 5 MG Tabs tablet Commonly known as: ELIQUIS Take 1 tablet (5 mg total) by mouth 2 (two) times daily.   ascorbic acid 500 MG tablet Commonly  known as: VITAMIN C Take 1 tablet (500 mg total) by mouth daily. Start taking on: July 27, 2020   atorvastatin 80 MG tablet Commonly known  as: LIPITOR Take 1 tablet (80 mg total) by mouth daily.   carvedilol 3.125 MG tablet Commonly known as: COREG Take 1 tablet (3.125 mg total) by mouth 2 (two) times daily with a meal.   colchicine 0.6 MG tablet Take 1 tablet (0.6 mg total) by mouth daily as needed.   cyanocobalamin 1000 MCG tablet Take 1 tablet (1,000 mcg total) by mouth daily.   ferrous sulfate 325 (65 FE) MG tablet Take 1 tablet (325 mg total) by mouth daily with breakfast.   folic acid 1 MG tablet Commonly known as: FOLVITE Take 1 tablet (1 mg total) by mouth daily. Start taking on: July 27, 2020   furosemide 40 MG tablet Commonly known as: Lasix Take 0.5 tablets (20 mg total) by mouth 2 (two) times daily.   HYDROcodone-acetaminophen 5-325 MG tablet Commonly known as: NORCO/VICODIN Take 1 tablet by mouth every 4 (four) hours as needed for moderate pain.   lisinopril 10 MG tablet Commonly known as: ZESTRIL Take 20 mg by mouth daily.   nitroGLYCERIN 0.4 MG SL tablet Commonly known as: Nitrostat Place 1 tablet (0.4 mg total) under the tongue every 5 (five) minutes as needed.   polyethylene glycol 17 g packet Commonly known as: MIRALAX / GLYCOLAX Take 17 g by mouth daily as needed for moderate constipation.   potassium chloride 10 MEQ tablet Commonly known as: KLOR-CON Take 1 tablet (10 mEq total) by mouth daily.   predniSONE 50 MG tablet Commonly known as: DELTASONE Take 1 tablet (50 mg total) by mouth daily with breakfast for 5 days.   Ventolin HFA 108 (90 Base) MCG/ACT inhaler Generic drug: albuterol Inhale 2 puffs into the lungs every 6 (six) hours as needed for wheezing or shortness of breath.      No Known Allergies Discharge Instructions    Diet - low sodium heart healthy   Complete by: As directed    Discharge instructions   Complete by: As directed    Follow-up with PCP in 1 week, start allopurinol after 5 days after resolution of acute gout attack. Continue colchicine 0.6 mg  for 5 days along with prednisone and then take as needed Repeat iron profile, vitamin B12 and folate level after 3 to 6 months.  Continue supplements.   Increase activity slowly   Complete by: As directed    No wound care   Complete by: As directed       The results of significant diagnostics from this hospitalization (including imaging, microbiology, ancillary and laboratory) are listed below for reference.    Significant Diagnostic Studies: DG Chest 1 View  Result Date: 07/24/2020 CLINICAL DATA:  Right-sided chest pain EXAM: CHEST  1 VIEW COMPARISON:  July 18, 2020 FINDINGS: The heart size and mediastinal contours are mildly enlarged. There is prominence of the central pulmonary vasculature. No large airspace consolidation or pleural effusion. The visualized skeletal structures are unremarkable. IMPRESSION: Unchanged cardiomegaly and pulmonary vascular congestion Electronically Signed   By: Jonna Clark M.D.   On: 07/24/2020 03:34   US Venous Img Lower Bilateral (DVT)  Result Date: 07/24/2020 CLINICAL DATA:  Bilateral lower extremity pain and edema for the past 2 weeks. History of smoking. Patient is currently on anticoagulation. Evaluate for DVT. EXAM: BILATERAL LOWER EXTREMITY VENOUS DOPPLER ULTRASOUND TECHNIQUE: Gray-scale sonography with graded compression, as well as color  Doppler and duplex ultrasound were performed to evaluate the lower extremity deep venous systems from the level of the common femoral vein and including the common femoral, femoral, profunda femoral, popliteal and calf veins including the posterior tibial, peroneal and gastrocnemius veins when visible. The superficial great saphenous vein was also interrogated. Spectral Doppler was utilized to evaluate flow at rest and with distal augmentation maneuvers in the common femoral, femoral and popliteal veins. COMPARISON:  Left lower extremity venous Doppler ultrasound-07/18/2020; 12/13/2018 (both negative) Right lower  extremity venous Doppler ultrasound-03/05/2017 (negative) FINDINGS: RIGHT LOWER EXTREMITY Common Femoral Vein: No evidence of thrombus. Normal compressibility, respiratory phasicity and response to augmentation. Saphenofemoral Junction: No evidence of thrombus. Normal compressibility and flow on color Doppler imaging. Profunda Femoral Vein: No evidence of thrombus. Normal compressibility and flow on color Doppler imaging. Femoral Vein: No evidence of thrombus. Normal compressibility, respiratory phasicity and response to augmentation. Popliteal Vein: No evidence of thrombus. Normal compressibility, respiratory phasicity and response to augmentation. Calf Veins: No evidence of thrombus. Normal compressibility and flow on color Doppler imaging. Superficial Great Saphenous Vein: No evidence of thrombus. Normal compressibility. Venous Reflux:  None. Other Findings: There is a moderate amount of subcutaneous edema at the level of the right lower leg and calf (representative image 35). LEFT LOWER EXTREMITY Common Femoral Vein: No evidence of thrombus. Normal compressibility, respiratory phasicity and response to augmentation. Saphenofemoral Junction: No evidence of thrombus. Normal compressibility and flow on color Doppler imaging. Profunda Femoral Vein: No evidence of thrombus. Normal compressibility and flow on color Doppler imaging. Femoral Vein: No evidence of thrombus. Normal compressibility, respiratory phasicity and response to augmentation. Popliteal Vein: No evidence of thrombus. Normal compressibility, respiratory phasicity and response to augmentation. Calf Veins: No evidence of thrombus. Normal compressibility and flow on color Doppler imaging. Superficial Great Saphenous Vein: No evidence of thrombus. Normal compressibility. Venous Reflux:  None. Other Findings: There is a moderate amount of subcutaneous edema at the level of the left lower leg and calf (representative image 68). IMPRESSION: 1. No evidence of  DVT within either lower extremity. 2. Moderate amount of subcutaneous edema at the level of the bilateral lower legs and calves. Electronically Signed   By: Simonne ComeJohn  Watts M.D.   On: 07/24/2020 09:52   US Venous Img Lower Unilateral Left  Result Date: 07/18/2020 CLINICAL DATA:  Leg swelling recent vascular surgery EXAM: Left LOWER EXTREMITY VENOUS DOPPLER ULTRASOUND TECHNIQUE: Gray-scale sonography with compression, as well as color and duplex ultrasound, were performed to evaluate the deep venous system(s) from the level of the common femoral vein through the popliteal and proximal calf veins. COMPARISON:  None. FINDINGS: VENOUS Normal compressibility of the common femoral, superficial femoral, and popliteal veins, as well as the visualized calf veins. Visualized portions of profunda femoral vein and great saphenous vein unremarkable. No filling defects to suggest DVT on grayscale or color Doppler imaging. Doppler waveforms show normal direction of venous flow, normal respiratory plasticity and response to augmentation. Limited views of the contralateral common femoral vein are unremarkable. OTHER Moderate edema within the subcutaneous soft tissues. Limitations: none IMPRESSION: Negative. Electronically Signed   By: Jasmine PangKim  Fujinaga M.D.   On: 07/18/2020 16:22   DG Chest Portable 1 View  Result Date: 07/18/2020 CLINICAL DATA:  60 year old male with generalized weakness. EXAM: PORTABLE CHEST 1 VIEW COMPARISON:  Chest radiograph dated 06/26/2020. FINDINGS: Cardiomegaly with vascular congestion. No focal consolidation, pleural effusion or pneumothorax. No acute osseous pathology. IMPRESSION: Cardiomegaly with vascular congestion. No focal consolidation.  Electronically Signed   By: Elgie Collard M.D.   On: 07/18/2020 17:11   DG Chest Port 1 View  Result Date: 06/26/2020 CLINICAL DATA:  Shortness of breath. EXAM: PORTABLE CHEST 1 VIEW COMPARISON:  One-view chest x-ray 02/17/2020 FINDINGS: Heart is enlarged.  Mild to moderate pulmonary vascular congestion is present. No significant edema is present. Mild atelectasis is present at the bases. IMPRESSION: Cardiomegaly and mild to moderate pulmonary vascular congestion without frank edema. Electronically Signed   By: Marin Roberts M.D.   On: 06/26/2020 13:49   DG Ankle Right Port  Result Date: 07/24/2020 CLINICAL DATA:  Right ankle swelling and pain.  No known injury. EXAM: PORTABLE RIGHT ANKLE - 2 VIEW COMPARISON:  None. FINDINGS: There is no acute bony or joint abnormality. Os trigonum and plantar calcaneal spurring noted. Soft tissue swelling is present about the ankle. No soft tissue gas or radiopaque foreign body. IMPRESSION: Soft tissue swelling without underlying acute bony or joint abnormality. Electronically Signed   By: Drusilla Kanner M.D.   On: 07/24/2020 11:24   VAS [place] ABI WITH/WO TBI  Result Date: 07/17/2020 LOWER EXTREMITY DOPPLER STUDY Indications: Left foot cold.  Vascular Interventions: 06/25/2020 PTA of left peroneal artery and tibioperoneal                         trunl. PTA and stent of left SFA and popliteal artery.                         PTA of left ATA. Stent of left EIA. Comparison Study: 06/19/2020 Performing Technologist: Reece Agar RT (R)(VS)  Examination Guidelines: A complete evaluation includes at minimum, Doppler waveform signals and systolic blood pressure reading at the level of bilateral brachial, anterior tibial, and posterior tibial arteries, when vessel segments are accessible. Bilateral testing is considered an integral part of a complete examination. Photoelectric Plethysmograph (PPG) waveforms and toe systolic pressure readings are included as required and additional duplex testing as needed. Limited examinations for reoccurring indications may be performed as noted.  ABI Findings: +---------+------------------+-----+----------+--------+ Right    Rt Pressure (mmHg)IndexWaveform  Comment   +---------+------------------+-----+----------+--------+ Brachial 123                                       +---------+------------------+-----+----------+--------+ ATA      80                0.65 biphasic           +---------+------------------+-----+----------+--------+ PTA      86                0.70 monophasic         +---------+------------------+-----+----------+--------+ Great Toe62                0.50 Abnormal           +---------+------------------+-----+----------+--------+ +---------+------------------+-----+--------+-------+ Left     Lt Pressure (mmHg)IndexWaveformComment +---------+------------------+-----+--------+-------+ Brachial 120                                    +---------+------------------+-----+--------+-------+ ATA      129               1.05 biphasic        +---------+------------------+-----+--------+-------+ PTA  134               1.09 biphasic        +---------+------------------+-----+--------+-------+ Great Toe35                0.28 Abnormal        +---------+------------------+-----+--------+-------+ +-------+-----------+-----------+------------+------------+ ABI/TBIToday's ABIToday's TBIPrevious ABIPrevious TBI +-------+-----------+-----------+------------+------------+ Right  .70        .50        1.23        .69          +-------+-----------+-----------+------------+------------+ Left   1.09       .28        1.0         .42          +-------+-----------+-----------+------------+------------+ Right ABIs appear decreased compared to prior study on 06/19/2020. Left ABIs appear essentially unchanged compared to prior study on 06/19/2020.  Summary: Right: Resting right ankle-brachial index indicates moderate right lower extremity arterial disease. The right toe-brachial index is abnormal. Right TBI appears essentially unchanged as compared to the previous exam onb 12-14/2021. Left: Resting left ankle-brachial  index is within normal range. No evidence of significant left lower extremity arterial disease. The left toe-brachial index is abnormal. Left TBI appears decreased as compared to the previous exam on 06/19/2020.  *See table(s) above for measurements and observations.  Electronically signed by Festus Barren MD on 07/17/2020 at 1:24:39 PM.    Final     Microbiology: Recent Results (from the past 240 hour(s))  Resp Panel by RT-PCR (Flu A&B, Covid) Nasopharyngeal Swab     Status: None   Collection Time: 07/18/20  5:05 PM   Specimen: Nasopharyngeal Swab; Nasopharyngeal(NP) swabs in vial transport medium  Result Value Ref Range Status   SARS Coronavirus 2 by RT PCR NEGATIVE NEGATIVE Final    Comment: (NOTE) SARS-CoV-2 target nucleic acids are NOT DETECTED.  The SARS-CoV-2 RNA is generally detectable in upper respiratory specimens during the acute phase of infection. The lowest concentration of SARS-CoV-2 viral copies this assay can detect is 138 copies/mL. A negative result does not preclude SARS-Cov-2 infection and should not be used as the sole basis for treatment or other patient management decisions. A negative result may occur with  improper specimen collection/handling, submission of specimen other than nasopharyngeal swab, presence of viral mutation(s) within the areas targeted by this assay, and inadequate number of viral copies(<138 copies/mL). A negative result must be combined with clinical observations, patient history, and epidemiological information. The expected result is Negative.  Fact Sheet for Patients:  BloggerCourse.com  Fact Sheet for Healthcare Providers:  SeriousBroker.it  This test is no t yet approved or cleared by the [place] FDA and  has been authorized for detection and/or diagnosis of SARS-CoV-2 by FDA under an Emergency Use Authorization (EUA). This EUA will remain  in effect (meaning this test can be used)  for the duration of the COVID-19 declaration under Section 564(b)(1) of the Act, 21 U.S.C.section 360bbb-3(b)(1), unless the authorization is terminated  or revoked sooner.       Influenza A by PCR NEGATIVE NEGATIVE Final   Influenza B by PCR NEGATIVE NEGATIVE Final    Comment: (NOTE) The Xpert Xpress SARS-CoV-2/FLU/RSV plus assay is intended as an aid in the diagnosis of influenza from Nasopharyngeal swab specimens and should not be used as a sole basis for treatment. Nasal washings and aspirates are unacceptable for Xpert Xpress SARS-CoV-2/FLU/RSV testing.  Fact Sheet for Patients: BloggerCourse.com  Fact Sheet  for Healthcare Providers: SeriousBroker.it  This test is not yet approved or cleared by the [place] and has been authorized for detection and/or diagnosis of SARS-CoV-2 by FDA under an Emergency Use Authorization (EUA). This EUA will remain in effect (meaning this test can be used) for the duration of the COVID-19 declaration under Section 564(b)(1) of the Act, 21 U.S.C. section 360bbb-3(b)(1), unless the authorization is terminated or revoked.  Performed at [facility], [address] [place], [place] [address]   Resp Panel by RT-PCR (Flu A&B, Covid) Nasopharyngeal Swab     Status: None   Collection Time: 07/24/20  7:43 AM   Specimen: Nasopharyngeal Swab; Nasopharyngeal(NP) swabs in vial transport medium  Result Value Ref Range Status   SARS Coronavirus 2 by RT PCR NEGATIVE NEGATIVE Final    Comment: (NOTE) SARS-CoV-2 target nucleic acids are NOT DETECTED.  The SARS-CoV-2 RNA is generally detectable in upper respiratory specimens during the acute phase of infection. The lowest concentration of SARS-CoV-2 viral copies this assay can detect is 138 copies/mL. A negative result does not preclude SARS-Cov-2 infection and should not be used as the sole basis for treatment or other patient  management decisions. A negative result may occur with  improper specimen collection/handling, submission of specimen other than nasopharyngeal swab, presence of viral mutation(s) within the areas targeted by this assay, and inadequate number of viral copies(<138 copies/mL). A negative result must be combined with clinical observations, patient history, and epidemiological information. The expected result is Negative.  Fact Sheet for Patients:  BloggerCourse.com  Fact Sheet for Healthcare Providers:  SeriousBroker.it  This test is no t yet approved or cleared by the [place] FDA and  has been authorized for detection and/or diagnosis of SARS-CoV-2 by FDA under an Emergency Use Authorization (EUA). This EUA will remain  in effect (meaning this test can be used) for the duration of the COVID-19 declaration under Section 564(b)(1) of the Act, 21 U.S.C.section 360bbb-3(b)(1), unless the authorization is terminated  or revoked sooner.       Influenza A by PCR NEGATIVE NEGATIVE Final   Influenza B by PCR NEGATIVE NEGATIVE Final    Comment: (NOTE) The Xpert Xpress SARS-CoV-2/FLU/RSV plus assay is intended as an aid in the diagnosis of influenza from Nasopharyngeal swab specimens and should not be used as a sole basis for treatment. Nasal washings and aspirates are unacceptable for Xpert Xpress SARS-CoV-2/FLU/RSV testing.  Fact Sheet for Patients: BloggerCourse.com  Fact Sheet for Healthcare Providers: SeriousBroker.it  This test is not yet approved or cleared by the [place] FDA and has been authorized for detection and/or diagnosis of SARS-CoV-2 by FDA under an Emergency Use Authorization (EUA). This EUA will remain in effect (meaning this test can be used) for the duration of the COVID-19 declaration under Section 564(b)(1) of the Act, 21 U.S.C. section 360bbb-3(b)(1),  unless the authorization is terminated or revoked.  Performed at [facility], [address] [place], [place] [address]      Labs: CBC: Recent Labs  Lab 07/23/20 1634 07/25/20 0553 07/26/20 0545  WBC 14.3* 9.8 10.9*  HGB 11.8* 10.7* 10.5*  HCT 36.6* 34.1* 33.7*  MCV 118.8* 119.2* 118.2*  PLT 205 190 204   Basic Metabolic Panel: Recent Labs  Lab 07/23/20 1634 07/24/20 0743 07/25/20 0553 07/26/20 0545  NA 136  --  138 137  K 3.2*  --  4.0 4.3  CL 99  --  99 99  CO2 28  --  28 30  GLUCOSE 109*  --  125* 113*  BUN 22*  --  29* 38*  CREATININE 1.20  --  1.07 1.18  CALCIUM 9.0  --  9.1 8.7*  MG  --  1.8  --  1.9  PHOS  --   --   --  3.4   Liver Function Tests: No results for input(s): AST, ALT, ALKPHOS, BILITOT, PROT, ALBUMIN in the last 168 hours. No results for input(s): LIPASE, AMYLASE in the last 168 hours. No results for input(s): AMMONIA in the last 168 hours. Cardiac Enzymes: Recent Labs  Lab 07/24/20 0743  CKTOTAL 176   BNP (last 3 results) Recent Labs    02/17/20 0526 06/27/20 0343 07/23/20 2252  BNP 2,228.8* 3,221.6* 1,973.2*   CBG: No results for input(s): GLUCAP in the last 168 hours.  Time spent: 35 minutes  Signed:  Gillis Santa  [facility]  07/26/2020 12:45 PM

## 2020-08-02 NOTE — Progress Notes (Deleted)
   Patient ID: Cameron [name], male    DOB: [birth year], 60 y.o.   MRN: [id number]  HPI  Mr Bady is a 60 y/o male with a history of  Echo report from 02/17/20 reviewed and showed an EF of 20-25% along with moderately elevated PA pressure and mild/ moderate MR.   LHC done 06/17/16 and showed:  Prox Cx to Mid Cx lesion, 100 %stenosed.  Ost LAD to Mid LAD lesion, 100 %stenosed.  Ost 1st Diag to 1st Diag lesion, 100 %stenosed.  Ost RCA to Prox RCA lesion, 60 %stenosed.  Mid RCA lesion, 50 %stenosed.  Dist RCA lesion, 75 %stenosed.  There is moderate to severe left ventricular systolic dysfunction.  LV end diastolic pressure is mildly elevated.  The left ventricular ejection fraction is 25-35% by visual estimate.  Admitted 07/24/20 due to pain in multiple joints. Orthopaedic consult obtained. Doppler negative for DVT. IV solumedrol given for gout flare which was transitioned to oral prednisone. Initially given IV lasix with transition to oral diuretics. Discharged after 2 days. Was in the ED 07/18/20 due to leg swelling. Ultrasound negative for DVT. Released. Admitted 06/23/20 due to acute on chronic HF. Initially given IV lasix and then transitioned to oral diuretics. Needed oxygen at 2L. Vascular consulted and had 3 angioplasties and 2 stents placed for the left foot. PT eval completed. Discharged after 6 days.   He presents today for his initial visit with a chief complaint   Review of Systems    Physical Exam  Assessment & Plan:  1: Chronic heart failure with reduced ejection fraction- - NYHA class - saw cardiology [name]) 03/02/20 - BNP 07/23/20 was 1973.2  2: HTN- - BP - BMP 07/26/20 reviewed and showed sodium 137, potassium 4.3, creatinine 1.18 and GFR >60  3: Tobacco use-  4: PVD- - saw vascular (Dew) 07/10/20

## 2020-08-03 ENCOUNTER — Ambulatory Visit: Payer: Commercial Managed Care - PPO | Admitting: Family

## 2020-08-03 ENCOUNTER — Telehealth: Payer: Self-pay | Admitting: Family

## 2020-08-03 NOTE — Telephone Encounter (Signed)
Patient did not show for his Heart Failure Clinic appointment on 08/03/20. Will attempt to reschedule.

## 2020-08-07 ENCOUNTER — Ambulatory Visit (INDEPENDENT_AMBULATORY_CARE_PROVIDER_SITE_OTHER): Payer: Commercial Managed Care - PPO | Admitting: Nurse Practitioner

## 2020-08-13 ENCOUNTER — Telehealth: Payer: Self-pay | Admitting: Family

## 2020-08-13 NOTE — Telephone Encounter (Signed)
Unable to reach patient in attempt to reschedule a no show new patient appointment for the CHF Clinic.   Rhylen Pulido, NT

## 2020-08-17 ENCOUNTER — Emergency Department: Payer: Commercial Managed Care - PPO

## 2020-08-17 ENCOUNTER — Inpatient Hospital Stay
Admission: EM | Admit: 2020-08-17 | Discharge: 2020-08-19 | DRG: 603 | Disposition: A | Discharge status: Home / Self Care | Payer: Commercial Managed Care - PPO | Attending: Internal Medicine | Admitting: Internal Medicine

## 2020-08-17 ENCOUNTER — Other Ambulatory Visit: Payer: Self-pay

## 2020-08-17 ENCOUNTER — Encounter: Payer: Self-pay | Admitting: Emergency Medicine

## 2020-08-17 DIAGNOSIS — I252 Old myocardial infarction: Secondary | ICD-10-CM

## 2020-08-17 DIAGNOSIS — I130 Hypertensive heart and chronic kidney disease with heart failure and stage 1 through stage 4 chronic kidney disease, or unspecified chronic kidney disease: Secondary | ICD-10-CM | POA: Diagnosis present

## 2020-08-17 DIAGNOSIS — L03116 Cellulitis of left lower limb: Principal | ICD-10-CM | POA: Diagnosis present

## 2020-08-17 DIAGNOSIS — F17210 Nicotine dependence, cigarettes, uncomplicated: Secondary | ICD-10-CM | POA: Diagnosis present

## 2020-08-17 DIAGNOSIS — Z7901 Long term (current) use of anticoagulants: Secondary | ICD-10-CM | POA: Diagnosis not present

## 2020-08-17 DIAGNOSIS — M109 Gout, unspecified: Secondary | ICD-10-CM | POA: Diagnosis present

## 2020-08-17 DIAGNOSIS — N179 Acute kidney failure, unspecified: Secondary | ICD-10-CM | POA: Diagnosis present

## 2020-08-17 DIAGNOSIS — N1831 Chronic kidney disease, stage 3a: Secondary | ICD-10-CM | POA: Diagnosis present

## 2020-08-17 DIAGNOSIS — Z955 Presence of coronary angioplasty implant and graft: Secondary | ICD-10-CM

## 2020-08-17 DIAGNOSIS — I5022 Chronic systolic (congestive) heart failure: Secondary | ICD-10-CM | POA: Diagnosis present

## 2020-08-17 DIAGNOSIS — I480 Paroxysmal atrial fibrillation: Secondary | ICD-10-CM | POA: Diagnosis present

## 2020-08-17 DIAGNOSIS — D649 Anemia, unspecified: Secondary | ICD-10-CM | POA: Diagnosis present

## 2020-08-17 DIAGNOSIS — Z79899 Other long term (current) drug therapy: Secondary | ICD-10-CM | POA: Diagnosis not present

## 2020-08-17 DIAGNOSIS — Z20822 Contact with and (suspected) exposure to covid-19: Secondary | ICD-10-CM | POA: Diagnosis present

## 2020-08-17 DIAGNOSIS — L03031 Cellulitis of right toe: Principal | ICD-10-CM | POA: Diagnosis present

## 2020-08-17 DIAGNOSIS — I10 Essential (primary) hypertension: Secondary | ICD-10-CM | POA: Diagnosis not present

## 2020-08-17 DIAGNOSIS — Z8249 Family history of ischemic heart disease and other diseases of the circulatory system: Secondary | ICD-10-CM | POA: Diagnosis not present

## 2020-08-17 DIAGNOSIS — L03115 Cellulitis of right lower limb: Secondary | ICD-10-CM | POA: Diagnosis present

## 2020-08-17 DIAGNOSIS — I482 Chronic atrial fibrillation, unspecified: Secondary | ICD-10-CM | POA: Diagnosis present

## 2020-08-17 DIAGNOSIS — I2510 Atherosclerotic heart disease of native coronary artery without angina pectoris: Secondary | ICD-10-CM | POA: Diagnosis present

## 2020-08-17 DIAGNOSIS — I7045 Atherosclerosis of autologous vein bypass graft(s) of other extremity with ulceration: Secondary | ICD-10-CM

## 2020-08-17 DIAGNOSIS — I739 Peripheral vascular disease, unspecified: Secondary | ICD-10-CM | POA: Diagnosis present

## 2020-08-17 DIAGNOSIS — I96 Gangrene, not elsewhere classified: Secondary | ICD-10-CM

## 2020-08-17 DIAGNOSIS — Z720 Tobacco use: Secondary | ICD-10-CM | POA: Diagnosis not present

## 2020-08-17 DIAGNOSIS — E785 Hyperlipidemia, unspecified: Secondary | ICD-10-CM | POA: Diagnosis present

## 2020-08-17 LAB — BASIC METABOLIC PANEL
Anion gap: 12 (ref 5–15)
BUN: 60 mg/dL — ABNORMAL HIGH (ref 6–20)
CO2: 23 mmol/L (ref 22–32)
Calcium: 9.2 mg/dL (ref 8.9–10.3)
Chloride: 102 mmol/L (ref 98–111)
Creatinine, Ser: 2.3 mg/dL — ABNORMAL HIGH (ref 0.61–1.24)
GFR, Estimated: 32 mL/min — ABNORMAL LOW (ref >60)
Glucose, Bld: 117 mg/dL — ABNORMAL HIGH (ref 70–99)
Potassium: 4.2 mmol/L (ref 3.5–5.1)
Sodium: 137 mmol/L (ref 135–145)

## 2020-08-17 LAB — CBC WITH DIFFERENTIAL/PLATELET
Abs Immature Granulocytes: 0.06 10*3/uL (ref 0.00–0.07)
Basophils Absolute: 0.1 10*3/uL (ref 0.0–0.1)
Basophils Relative: 1 %
Eosinophils Absolute: 0.1 10*3/uL (ref 0.0–0.5)
Eosinophils Relative: 1 %
HCT: 38.6 % — ABNORMAL LOW (ref 39.0–52.0)
Hemoglobin: 12.1 g/dL — ABNORMAL LOW (ref 13.0–17.0)
Immature Granulocytes: 1 %
Lymphocytes Relative: 9 %
Lymphs Abs: 0.9 10*3/uL (ref 0.7–4.0)
MCH: 35.1 pg — ABNORMAL HIGH (ref 26.0–34.0)
MCHC: 31.3 g/dL (ref 30.0–36.0)
MCV: 111.9 fL — ABNORMAL HIGH (ref 80.0–100.0)
Monocytes Absolute: 0.5 10*3/uL (ref 0.1–1.0)
Monocytes Relative: 6 %
Neutro Abs: 7.6 10*3/uL (ref 1.7–7.7)
Neutrophils Relative %: 82 %
Platelets: 260 10*3/uL (ref 150–400)
RBC: 3.45 MIL/uL — ABNORMAL LOW (ref 4.22–5.81)
RDW: 17 % — ABNORMAL HIGH (ref 11.5–15.5)
WBC: 9.3 10*3/uL (ref 4.0–10.5)
nRBC: 0 % (ref 0.0–0.2)

## 2020-08-17 LAB — LACTIC ACID, PLASMA
Lactic Acid, Venous: 2.2 mmol/L (ref 0.5–1.9)
Lactic Acid, Venous: 1.7 mmol/L (ref 0.5–1.9)

## 2020-08-17 MED ORDER — ONDANSETRON HCL 4 MG PO TABS: 4.0000 mg | ORAL_TABLET | Freq: Four times a day (QID) | ORAL | Status: DC | PRN

## 2020-08-17 MED ORDER — ALLOPURINOL 100 MG PO TABS
100.0000 mg | ORAL_TABLET | Freq: Every day | ORAL | Status: DC
Start: 2020-08-18 — End: 2020-08-19
  Administered 2020-08-18 – 2020-08-19 (×2): 100 mg via ORAL
  Filled 2020-08-17 (×3): qty 1

## 2020-08-17 MED ORDER — LACTATED RINGERS IV SOLN: INTRAVENOUS | Status: DC

## 2020-08-17 MED ORDER — TRAZODONE HCL 50 MG PO TABS: 25.0000 mg | ORAL_TABLET | Freq: Every evening | ORAL | Status: DC | PRN

## 2020-08-17 MED ORDER — ASCORBIC ACID 500 MG PO TABS
500.0000 mg | ORAL_TABLET | Freq: Every day | ORAL | Status: DC
  Administered 2020-08-18 – 2020-08-19 (×2): 500 mg via ORAL
  Filled 2020-08-17 (×2): qty 1

## 2020-08-17 MED ORDER — FOLIC ACID 1 MG PO TABS
1.0000 mg | ORAL_TABLET | Freq: Every day | ORAL | Status: DC
  Administered 2020-08-18 – 2020-08-19 (×2): 1 mg via ORAL
  Filled 2020-08-17 (×2): qty 1

## 2020-08-17 MED ORDER — CARVEDILOL 3.125 MG PO TABS
3.1250 mg | ORAL_TABLET | Freq: Two times a day (BID) | ORAL | Status: DC
  Administered 2020-08-18 – 2020-08-19 (×3): 3.125 mg via ORAL
  Filled 2020-08-17 (×3): qty 1

## 2020-08-17 MED ORDER — ACETAMINOPHEN 325 MG PO TABS: 650.0000 mg | ORAL_TABLET | Freq: Four times a day (QID) | ORAL | Status: DC | PRN

## 2020-08-17 MED ORDER — ACETAMINOPHEN 650 MG RE SUPP: 650.0000 mg | Freq: Four times a day (QID) | RECTAL | Status: DC | PRN

## 2020-08-17 MED ORDER — SODIUM CHLORIDE 0.9 % IV SOLN: 1.0000 g | INTRAVENOUS | Status: DC

## 2020-08-17 MED ORDER — COLCHICINE 0.6 MG PO TABS
0.6000 mg | ORAL_TABLET | Freq: Every day | ORAL | Status: DC | PRN
  Filled 2020-08-17: qty 1

## 2020-08-17 MED ORDER — APIXABAN 5 MG PO TABS
5.0000 mg | ORAL_TABLET | Freq: Two times a day (BID) | ORAL | Status: DC
  Administered 2020-08-18 – 2020-08-19 (×4): 5 mg via ORAL
  Filled 2020-08-17 (×4): qty 1

## 2020-08-17 MED ORDER — SODIUM CHLORIDE 0.9 % IV SOLN: INTRAVENOUS | Status: DC

## 2020-08-17 MED ORDER — NITROGLYCERIN 0.4 MG SL SUBL: 0.4000 mg | SUBLINGUAL_TABLET | SUBLINGUAL | Status: DC | PRN

## 2020-08-17 MED ORDER — MAGNESIUM HYDROXIDE 400 MG/5ML PO SUSP
30.0000 mL | Freq: Every day | ORAL | Status: DC | PRN
  Filled 2020-08-17: qty 30

## 2020-08-17 MED ORDER — POTASSIUM CHLORIDE ER 10 MEQ PO TBCR
10.0000 meq | EXTENDED_RELEASE_TABLET | Freq: Every day | ORAL | Status: DC
  Administered 2020-08-18 – 2020-08-19 (×2): 10 meq via ORAL
  Filled 2020-08-17 (×4): qty 1

## 2020-08-17 MED ORDER — SODIUM CHLORIDE 0.9 % IV SOLN
2.0000 g | INTRAVENOUS | Status: DC
  Administered 2020-08-17: 2 g via INTRAVENOUS
  Filled 2020-08-17 (×2): qty 20

## 2020-08-17 MED ORDER — ATORVASTATIN CALCIUM 20 MG PO TABS
80.0000 mg | ORAL_TABLET | Freq: Every day | ORAL | Status: DC
Start: 2020-08-18 — End: 2020-08-19
  Administered 2020-08-18 – 2020-08-19 (×2): 80 mg via ORAL
  Filled 2020-08-17 (×2): qty 4

## 2020-08-17 MED ORDER — VITAMIN B-12 1000 MCG PO TABS
1000.0000 ug | ORAL_TABLET | Freq: Every day | ORAL | Status: DC
  Administered 2020-08-18 – 2020-08-19 (×2): 1000 ug via ORAL
  Filled 2020-08-17 (×3): qty 1

## 2020-08-17 MED ORDER — ONDANSETRON HCL 4 MG/2ML IJ SOLN: 4.0000 mg | Freq: Four times a day (QID) | INTRAMUSCULAR | Status: DC | PRN

## 2020-08-17 NOTE — Progress Notes (Signed)
Code Sepsis initiated @ 2211. Elink following.

## 2020-08-17 NOTE — ED Triage Notes (Signed)
Pt to ED via POV with c/o blisters that have come up to the top of his L foot. Pt states was given an abx and ointment to put on blisters. Pt states burning pain at this time, and increased pain with walking.   Pt with noted wounds to tip of R great toe and heel, R great toe is noted to be swollen and red, wound beds noted to be brown and dry at this time.

## 2020-08-17 NOTE — H&P (Signed)
[name]   PATIENT NAME: Cameron [name]    MR#:  [id number]  DATE OF BIRTH:  May 08, 1961  DATE OF ADMISSION:  08/17/2020  PRIMARY CARE PHYSICIAN: [facility]   Patient is coming from: Home  REQUESTING/REFERRING PHYSICIAN: Donna Bernard, MD CHIEF COMPLAINT:   Chief Complaint  Patient presents with  . Recurrent Skin Infections    HISTORY OF PRESENT ILLNESS:  Cameron [name] is a 60 y.o. Caucasian male with medical history significant for CHF, coronary artery disease status post PCI and stent, hypertension, dyslipidemia and peripheral arterial disease was recently treated for left foot cellulitis on outpatient basis with p.o. Keflex and antibiotic ointment and is recently developed blisters then ulcer and scabbing and has been having worsening pain with ambulation.  He has wounds at the tip of the right great toe with generalized swelling as well as the heel.  No fever or chills.  No nausea or vomiting or abdominal pain.  No chest pain or dyspnea or cough or wheezing.  No dysuria, oliguria or hematuria or flank pain.  He has been taking his colchicine recently for gouty arthritis and is currently resolved during that time he has not taken his amiodarone. ED Course: When he came to the ER, temperature was 100.1 with otherwise normal vital signs.  Labs revealed anemia at baseline and a BUN of 60 with a creatinine of 2.3 up from 38 and 1.183 weeks ago.  Lactic acid came back 2.2.  Imaging: Left foot x-ray showed soft tissue edema over the dorsum and possibly plantar aspect with no radiographic evidence of osteomyelitis.  It showed plantar calcaneal spear and pes cavus with mild midfoot osteoarthritis.  The patient was given 2 g of IV Rocephin IV lactated Ringer 150 mill per hour.  He will be admitted to a medical bed for further evaluation and management. PAST MEDICAL HISTORY:   Past Medical History:  Diagnosis Date  . CHF (congestive heart failure) (HCC)   . Coronary  artery disease    a. 12/17 PCI with DES to pLAD with resdiual disease (RX therapy)  . Hyperlipidemia   . Hypertension   . PAD (peripheral artery disease) (HCC)   . STEMI (ST elevation myocardial infarction) (HCC) 2017    PAST SURGICAL HISTORY:   Past Surgical History:  Procedure Laterality Date  . CARDIAC CATHETERIZATION N/A 06/17/2016   Procedure: Left Heart Cath and Coronary Angiography;  Surgeon: Runell Gess, MD;  Location: [facility] INVAS[facility] CV LAB;  Service: Cardiovascular;  Laterality: N/A;  . CARDIAC CATHETERIZATION N/A 06/17/2016   Procedure: Coronary Stent Intervention;  Surgeon: Runell Gess, MD;  Location: [facility] INVASIVE CV LAB;  Service: Cardiovascular;  Laterality: N/A;  . cardiac stents    . CERVICAL SPINE SURGERY    . LOWER EXTREMITY ANGIOGRAPHY Left 06/25/2020   Procedure: LOWER EXTREMITY ANGIOGRAPHY;  Surgeon: Annice Needy, MD;  Location: [facility] INVASIVE CV LAB;  Service: Cardiovascular;  Laterality: Left;    SOCIAL HISTORY:   Social History   Tobacco Use  . Smoking status: Current Every Day Smoker    Packs/day: 1.00    Types: Cigarettes  . Smokeless tobacco: Never Used  Substance Use Topics  . Alcohol use: Not Currently    Comment: weekends    FAMILY HISTORY:   Family History  Problem Relation Age of Onset  . CAD Paternal Grandfather   . Breast cancer Mother   . Parkinson's disease Father   . Alzheimer's disease Father   .  Hypertension Father   . Diabetes Father     DRUG ALLERGIES:  No Known Allergies  REVIEW OF SYSTEMS:   ROS As per history of present illness. All pertinent systems were reviewed above. Constitutional, HEENT, cardiovascular, respiratory, GI, GU, musculoskeletal, neuro, psychiatric, endocrine, integumentary and hematologic systems were reviewed and are otherwise negative/unremarkable except for positive findings mentioned above in the HPI.   MEDICATIONS AT HOME:   Prior to Admission medications   Medication Sig Start Date End  Date Taking? Authorizing Provider  allopurinol (ZYLOPRIM) 100 MG tablet Take 1 tablet (100 mg total) by mouth daily. 07/26/20 07/26/21 Yes Gillis Santa, MD  apixaban (ELIQUIS) 5 MG TABS tablet Take 1 tablet (5 mg total) by mouth 2 (two) times daily. 06/29/20 07/29/20 Yes Wieting, Richard, MD  ascorbic acid (VITAMIN C) 500 MG tablet Take 1 tablet (500 mg total) by mouth daily. 07/27/20  Yes Gillis Santa, MD  atorvastatin (LIPITOR) 80 MG tablet Take 1 tablet (80 mg total) by mouth daily. 02/21/20  Yes Charise Killian, MD  carvedilol (COREG) 3.125 MG tablet Take 1 tablet (3.125 mg total) by mouth 2 (two) times daily with a meal. 06/29/20  Yes Wieting, Richard, MD  colchicine 0.6 MG tablet Take 1 tablet (0.6 mg total) by mouth daily as needed. 07/26/20 08/25/20 Yes Gillis Santa, MD  folic acid (FOLVITE) 1 MG tablet Take 1 tablet (1 mg total) by mouth daily. 07/27/20  Yes Gillis Santa, MD  furosemide (LASIX) 40 MG tablet Take 0.5 tablets (20 mg total) by mouth 2 (two) times daily. 06/29/20 07/29/20 Yes Wieting, Richard, MD  HYDROcodone-acetaminophen (NORCO/VICODIN) 5-325 MG tablet Take 1 tablet by mouth every 6 (six) hours as needed for moderate pain. 07/26/20  Yes Gillis Santa, MD  lisinopril (ZESTRIL) 10 MG tablet Take 20 mg by mouth daily.   Yes [provider]  potassium chloride (KLOR-CON) 10 MEQ tablet Take 1 tablet (10 mEq total) by mouth daily. 06/29/20 07/29/20 Yes Wieting, Richard, MD  vitamin B-12 1000 MCG tablet Take 1 tablet (1,000 mcg total) by mouth daily. 06/30/20  Yes Wieting, Richard, MD  amiodarone (PACERONE) 200 MG tablet Take 200 mg by mouth 2 (two) times daily. Patient not taking: Reported on 08/17/2020 07/21/20   [provider]  ferrous sulfate 325 (65 FE) MG tablet Take 1 tablet (325 mg total) by mouth daily with breakfast. Patient not taking: Reported on 08/17/2020 06/29/20   Alford Highland, MD  metolazone (ZAROXOLYN) 2.5 MG tablet Take 2.5 mg by mouth  daily. Patient not taking: Reported on 08/17/2020 08/01/20   [provider]  nitroGLYCERIN (NITROSTAT) 0.4 MG SL tablet Place 1 tablet (0.4 mg total) under the tongue every 5 (five) minutes as needed. Patient not taking: No sig reported 06/20/16   Laverda Page B, NP  polyethylene glycol (MIRALAX / GLYCOLAX) 17 g packet Take 17 g by mouth daily as needed for moderate constipation. Patient not taking: No sig reported 06/29/20   Alford Highland, MD  VENTOLIN HFA 108 [phone] Base) MCG/ACT inhaler Inhale 2 puffs into the lungs every 6 (six) hours as needed for wheezing or shortness of breath. Patient not taking: No sig reported 06/29/20   Alford Highland, MD  isosorbide mononitrate (IMDUR) 60 MG 24 hr tablet Take 1 tablet (60 mg total) by mouth daily. 02/04/18 05/30/19  [name], MD      VITAL SIGNS:  Blood pressure 98/73, pulse 100, temperature 98.2 F (36.8 C), temperature source Oral, resp. rate 16, height 5\' 7"  (  1.702 m), weight 86.2 kg, SpO2 98 %.  PHYSICAL EXAMINATION:  Physical Exam  GENERAL:  60 y.o.-year-old Caucasian male patient lying in the bed with no acute distress.  EYES: Pupils equal, round, reactive to light and accommodation. No scleral icterus. Extraocular muscles intact.  HEENT: Head atraumatic, normocephalic. Oropharynx and nasopharynx clear.  NECK:  Supple, no jugular venous distention. No thyroid enlargement, no tenderness.  LUNGS: Normal breath sounds bilaterally, no wheezing, rales,rhonchi or crepitation. No use of accessory muscles of respiration.  CARDIOVASCULAR: Regular rate and rhythm, S1, S2 normal. No murmurs, rubs, or gallops.  ABDOMEN: Soft, nondistended, nontender. Bowel sounds present. No organomegaly or mass.  EXTREMITIES: No pedal edema, cyanosis, or clubbing.  NEUROLOGIC: Cranial nerves II through XII are intact. Muscle strength 5/5 in all extremities. Sensation intact. Gait not checked.  PSYCHIATRIC: The patient is alert and oriented x 3.   Normal affect and good eye contact. SKIN: Erythema, tenderness and swelling of the left  big toe with distal wound with blackish scabbing and macerated left heel wound with black scabbing with surrounding erythema and tenderness        LABORATORY PANEL:   CBC Recent Labs  Lab 08/17/20 1837  WBC 9.3  HGB 12.1*  HCT 38.6*  PLT 260   ------------------------------------------------------------------------------------------------------------------  Chemistries  Recent Labs  Lab 08/17/20 1837  NA 137  K 4.2  CL 102  CO2 23  GLUCOSE 117*  BUN 60*  CREATININE 2.30*  CALCIUM 9.2   ------------------------------------------------------------------------------------------------------------------  Cardiac Enzymes No results for input(s): TROPONINI in the last 168 hours. ------------------------------------------------------------------------------------------------------------------  RADIOLOGY:  DG Foot Complete Left  Result Date: 08/17/2020 CLINICAL DATA:  Infected wound.  Blisters on top of foot. EXAM: LEFT FOOT - COMPLETE 3+ VIEW COMPARISON:  None. FINDINGS: There is no evidence of fracture or dislocation. No erosion, bony destruction, or periosteal reaction. Pes cavus. Mild midfoot osteoarthritis. Plantar calcaneal spur. Soft tissue edema overlies the dorsum of the foot. Possible plantar soft tissue edema. No radiopaque foreign body or tracking soft tissue air. IMPRESSION: 1. Soft tissue edema over the dorsum and possibly plantar aspect. No radiographic evidence of osteomyelitis. 2. Pes cavus and mild midfoot osteoarthritis. 3. Plantar calcaneal spur. Electronically Signed   By: Narda Rutherford M.D.   On: 08/17/2020 19:19      IMPRESSION AND PLAN:  Active Problems:   Cellulitis of left foot  1.  Left foot moderate nonpurulent cellulitis failing outpatient treatment, secondary to infected wound. -The patient will be admitted to a medical bed. -We will continue  antibiotic therapy with IV Rocephin. -Warm compresses will be applied. -We will follow lactic acid level.  2.  Acute kidney injury. -The patient will be hydrated with IV normal saline. -We will follow BMPs. -We will hold off diuretics and nephrotoxins.  3.  Essential hypertension. -We will continue his Coreg and hold off his lisinopril given acute kidney injury.  4.  Gout. -We will continue his allopurinol and as needed colchicine.  5.  Dyslipidemia. -Statin therapy will be resumed.  6.  Coronary artery disease status post PCI and stent. -Statin therapy beta-blocker therapy and aspirin will be resumed.  7.  Paroxysmal atrial fibrillation. -We will continue his Eliquis and amiodarone.  DVT prophylaxis: We will continue Eliquis. Code Status: full code. Family Communication:  The plan of care was discussed in details with the patient who requested no family notification at this time. I answered all questions. The patient agreed to proceed with the above mentioned plan.  Further management will depend upon hospital course. Disposition Plan: Back to previous home environment Consults called: none. All the records are reviewed and case discussed with ED provider.  Status is: Inpatient  Remains inpatient appropriate because:Ongoing active pain requiring inpatient pain management, Ongoing diagnostic testing needed not appropriate for outpatient work up, Unsafe d/c plan, IV treatments appropriate due to intensity of illness or inability to take PO and Inpatient level of care appropriate due to severity of illness   Dispo: The patient is from: Home              Anticipated d/c is to: Home              Anticipated d/c date is: 2 days              Patient currently is not medically stable to d/c.   Difficult to place patient No   TOTAL TIME TAKING CARE OF THIS PATIENT: 55 minutes.    Hannah Beat M.D on 08/17/2020 at 10:46 PM  [facility]   From 7 PM-7 AM, contact  night-coverage [email or web address]  CC: Primary care physician; [facility]

## 2020-08-17 NOTE — Progress Notes (Signed)
CODE SEPSIS - PHARMACY COMMUNICATION  **Broad Spectrum Antibiotics should be administered within 1 hour of Sepsis diagnosis**  Time Code Sepsis Called/Page Received: 2011  Antibiotics Ordered: Ceftriaxone  Time of 1st antibiotic administration: 2322  Otelia Sergeant, PharmD, [facility] 08/18/2020 12:11 AM

## 2020-08-17 NOTE — ED Notes (Signed)
Critical lab lactid acid 2.2

## 2020-08-17 NOTE — ED Provider Notes (Signed)
[facility] Emergency Department Provider Note   ____________________________________________   Event Date/Time   First MD Initiated Contact with Patient 08/17/20 2232     (approximate)  I have reviewed the triage vital signs and the nursing notes.   HISTORY  Chief Complaint Recurrent Skin Infections    HPI Cameron [name] is a 60 y.o. male with a stated past medical history of hypertension, CHF, and PAD who presents for worsening wounds to his left great toe and heel.  Patient states that over the last week he has had worsening blisters with surrounding erythema to those areas.  Patient was seen 1 week prior to arrival and prescribed Keflex with no improvement in his symptoms.  Patient is now complaining of spreading, aching, 9/10 pain that radiates up his foot and is worsened with any ambulation.  Patient currently denies any vision changes, tinnitus, difficulty speaking, facial droop, sore throat, chest pain, shortness of breath, abdominal pain, nausea/vomiting/diarrhea, dysuria, or weakness/numbness/paresthesias in any extremity         Past Medical History:  Diagnosis Date  . CHF (congestive heart failure) (HCC)   . Coronary artery disease    a. 12/17 PCI with DES to pLAD with resdiual disease (RX therapy)  . Hyperlipidemia   . Hypertension   . PAD (peripheral artery disease) (HCC)   . STEMI (ST elevation myocardial infarction) [facility] 2017    Patient Active Problem List   Diagnosis Date Noted  . Cellulitis of left foot 08/17/2020  . Pressure injury of skin 07/24/2020  . Acute gouty arthritis 07/24/2020  . Chronic systolic CHF (congestive heart failure) (HCC) 07/24/2020  . Atrial fibrillation, chronic (HCC) 07/24/2020  . Iron deficiency anemia 07/24/2020  . Leukocytosis 07/24/2020  . CKD (chronic kidney disease), stage II   . Hypotension   . Acute on chronic respiratory failure with hypoxia (HCC)   . PVD (peripheral vascular disease)  (HCC)   . AKI (acute kidney injury) (HCC)   . Macrocytic anemia   . Atherosclerosis of native arteries of extremity with rest pain (HCC) 06/19/2020  . Acute on chronic systolic CHF (congestive heart failure) (HCC) 02/17/2020  . Hypokalemia 02/17/2020  . Hypomagnesemia 02/17/2020  . Elevated troponin 02/17/2020  . New onset atrial fibrillation (HCC) 02/17/2020  . Olecranon bursitis of left elbow 02/17/2020  . NSVT (nonsustained ventricular tachycardia) (HCC) 02/17/2020  . Tobacco abuse 02/17/2020  . Apical mural thrombus 02/17/2020  . Gout_left elbow 02/17/2020  . Bilateral leg edema 10/19/2018  . Weight gain 10/19/2018  . Chest pain 02/02/2018  . Acute pain of right knee 03/13/2017  . Essential hypertension 06/20/2016  . Hyperlipidemia 06/20/2016  . CAD (coronary artery disease), native coronary artery 06/20/2016  . STEMI involving oth coronary artery of anterior wall (HCC) 06/17/2016  . STEMI (ST elevation myocardial infarction) (HCC) 06/17/2016    Past Surgical History:  Procedure Laterality Date  . CARDIAC CATHETERIZATION N/A 06/17/2016   Procedure: Left Heart Cath and Coronary Angiography;  Surgeon: Runell Gess, MD;  Location: [facility] INVASIVE CV LAB;  Service: Cardiovascular;  Laterality: N/A;  . CARDIAC CATHETERIZATION N/A 06/17/2016   Procedure: Coronary Stent Intervention;  Surgeon: Runell Gess, MD;  Location: [facility] INVASIVE CV LAB;  Service: Cardiovascular;  Laterality: N/A;  . cardiac stents    . CERVICAL SPINE SURGERY    . LOWER EXTREMITY ANGIOGRAPHY Left 06/25/2020   Procedure: LOWER EXTREMITY ANGIOGRAPHY;  Surgeon: Annice Needy, MD;  Location: [facility] INVASIVE CV LAB;  Service:  Cardiovascular;  Laterality: Left;    Prior to Admission medications   Medication Sig Start Date End Date Taking? Authorizing Provider  allopurinol (ZYLOPRIM) 100 MG tablet Take 1 tablet (100 mg total) by mouth daily. 07/26/20 07/26/21 Yes Gillis Santa, MD  apixaban (ELIQUIS) 5 MG TABS tablet  Take 1 tablet (5 mg total) by mouth 2 (two) times daily. 06/29/20 07/29/20 Yes Wieting, Richard, MD  ascorbic acid (VITAMIN C) 500 MG tablet Take 1 tablet (500 mg total) by mouth daily. 07/27/20  Yes Gillis Santa, MD  atorvastatin (LIPITOR) 80 MG tablet Take 1 tablet (80 mg total) by mouth daily. 02/21/20  Yes Charise Killian, MD  carvedilol (COREG) 3.125 MG tablet Take 1 tablet (3.125 mg total) by mouth 2 (two) times daily with a meal. 06/29/20  Yes Wieting, Richard, MD  colchicine 0.6 MG tablet Take 1 tablet (0.6 mg total) by mouth daily as needed. 07/26/20 08/25/20 Yes Gillis Santa, MD  folic acid (FOLVITE) 1 MG tablet Take 1 tablet (1 mg total) by mouth daily. 07/27/20  Yes Gillis Santa, MD  furosemide (LASIX) 40 MG tablet Take 0.5 tablets (20 mg total) by mouth 2 (two) times daily. 06/29/20 07/29/20 Yes Wieting, Richard, MD  HYDROcodone-acetaminophen (NORCO/VICODIN) 5-325 MG tablet Take 1 tablet by mouth every 6 (six) hours as needed for moderate pain. 07/26/20  Yes Gillis Santa, MD  lisinopril (ZESTRIL) 10 MG tablet Take 20 mg by mouth daily.   Yes [provider]  potassium chloride (KLOR-CON) 10 MEQ tablet Take 1 tablet (10 mEq total) by mouth daily. 06/29/20 07/29/20 Yes Wieting, Richard, MD  vitamin B-12 1000 MCG tablet Take 1 tablet (1,000 mcg total) by mouth daily. 06/30/20  Yes Wieting, Richard, MD  amiodarone (PACERONE) 200 MG tablet Take 200 mg by mouth 2 (two) times daily. Patient not taking: Reported on 08/17/2020 07/21/20   [provider]  ferrous sulfate 325 (65 FE) MG tablet Take 1 tablet (325 mg total) by mouth daily with breakfast. Patient not taking: Reported on 08/17/2020 06/29/20   Alford Highland, MD  metolazone (ZAROXOLYN) 2.5 MG tablet Take 2.5 mg by mouth daily. Patient not taking: Reported on 08/17/2020 08/01/20   [provider]  nitroGLYCERIN (NITROSTAT) 0.4 MG SL tablet Place 1 tablet (0.4 mg total) under the tongue every 5 (five) minutes as  needed. Patient not taking: No sig reported 06/20/16   Laverda Page B, NP  polyethylene glycol (MIRALAX / GLYCOLAX) 17 g packet Take 17 g by mouth daily as needed for moderate constipation. Patient not taking: No sig reported 06/29/20   Alford Highland, MD  VENTOLIN HFA 108 [phone] Base) MCG/ACT inhaler Inhale 2 puffs into the lungs every 6 (six) hours as needed for wheezing or shortness of breath. Patient not taking: No sig reported 06/29/20   Alford Highland, MD  isosorbide mononitrate (IMDUR) 60 MG 24 hr tablet Take 1 tablet (60 mg total) by mouth daily. 02/04/18 05/30/19  [name], MD    Allergies Patient has no known allergies.  Family History  Problem Relation Age of Onset  . CAD Paternal Grandfather   . Breast cancer Mother   . Parkinson's disease Father   . Alzheimer's disease Father   . Hypertension Father   . Diabetes Father     Social History Social History   Tobacco Use  . Smoking status: Current Every Day Smoker    Packs/day: 1.00    Types: Cigarettes  . Smokeless tobacco: Never Used  Vaping Use  .  Vaping Use: Never used  Substance Use Topics  . Alcohol use: Not Currently    Comment: weekends  . Drug use: Yes    Frequency: 1.0 times per week    Types: Marijuana    Comment: twice monthly    Review of Systems Constitutional: No fever/chills Eyes: No visual changes. ENT: No sore throat. Cardiovascular: Denies chest pain. Respiratory: Denies shortness of breath. Gastrointestinal: No abdominal pain.  No nausea, no vomiting.  No diarrhea. Genitourinary: Negative for dysuria. Musculoskeletal: Positive for acute left foot pain Skin: Ulcers and surrounding erythema and tenderness to palpation over the left great toe and heel Neurological: Negative for headaches, weakness/numbness/paresthesias in any extremity Psychiatric: Negative for suicidal ideation/homicidal ideation   ____________________________________________   PHYSICAL EXAM:  VITAL SIGNS: ED  Triage Vitals  Enc Vitals Group     BP 08/17/20 1822 98/73     Pulse Rate 08/17/20 1822 100     Resp 08/17/20 1822 16     Temp 08/17/20 1822 98.2 F (36.8 C)     Temp Source 08/17/20 1822 Oral     SpO2 08/17/20 1822 98 %     Weight 08/17/20 1834 190 lb (86.2 kg)     Height 08/17/20 1834 5\' 7"  (1.702 m)     Head Circumference --      Peak Flow --      Pain Score 08/17/20 1834 8     Pain Loc --      Pain Edu? --      Excl. in GC?  --      Constitutional: Alert and oriented. Well appearing and in no acute distress. Eyes: Conjunctivae are normal. PERRL. Head: Atraumatic. Nose: No congestion/rhinnorhea. Mouth/Throat: Mucous membranes are moist. Neck: No stridor Cardiovascular: Grossly normal heart sounds.  Good peripheral circulation. Respiratory: Normal respiratory effort.  No retractions. Gastrointestinal: Soft and nontender. No distention. Musculoskeletal: No obvious deformities Neurologic:  Normal speech and language. No gross focal neurologic deficits are appreciated. Skin:   Psychiatric: Mood and affect are normal. Speech and behavior are normal.  ____________________________________________   LABS (all labs ordered are listed, but only abnormal results are displayed)  Labs Reviewed  CBC WITH DIFFERENTIAL/PLATELET - Abnormal; Notable for the following components:      Result Value   RBC 3.45 (*)    Hemoglobin 12.1 (*)    HCT 38.6 (*)    MCV 111.9 (*)    MCH 35.1 (*)    RDW 17.0 (*)    All other components within normal limits  BASIC METABOLIC PANEL - Abnormal; Notable for the following components:   Glucose, Bld 117 (*)    BUN 60 (*)    Creatinine, Ser 2.30 (*)    GFR, Estimated 32 (*)    All other components within normal limits  LACTIC ACID, PLASMA - Abnormal; Notable for the following components:   Lactic Acid, Venous 2.2 (*)    All other components within normal limits  CULTURE, BLOOD (SINGLE)  LACTIC ACID, PLASMA  BASIC METABOLIC PANEL  CBC    RADIOLOGY  ED MD interpretation: X-ray of the left foot show soft tissue edema over the dorsum and possible plantar aspect without any evidence of osteomyelitis  Official radiology report(s): DG Foot Complete Left  Result Date: 08/17/2020 CLINICAL DATA:  Infected wound.  Blisters on top of foot. EXAM: LEFT FOOT - COMPLETE 3+ VIEW COMPARISON:  None. FINDINGS: There is no evidence of fracture or dislocation. No erosion, bony destruction, or periosteal reaction. Pes  cavus. Mild midfoot osteoarthritis. Plantar calcaneal spur. Soft tissue edema overlies the dorsum of the foot. Possible plantar soft tissue edema. No radiopaque foreign body or tracking soft tissue air. IMPRESSION: 1. Soft tissue edema over the dorsum and possibly plantar aspect. No radiographic evidence of osteomyelitis. 2. Pes cavus and mild midfoot osteoarthritis. 3. Plantar calcaneal spur. Electronically Signed   By: Narda Rutherford M.D.   On: 08/17/2020 19:19    ____________________________________________   PROCEDURES  Procedure(s) performed (including Critical Care):  .1-3 Lead EKG Interpretation Performed by: Merwyn Katos, MD Authorized by: Merwyn Katos, MD     Interpretation: normal     ECG rate:  89   ECG rate assessment: normal     Rhythm: sinus rhythm     Ectopy: none     Conduction: normal       ____________________________________________   INITIAL IMPRESSION / ASSESSMENT AND PLAN / ED COURSE  As part of my medical decision making, I reviewed the following data within the electronic MEDICAL RECORD NUMBER Nursing notes reviewed and incorporated, Labs reviewed, EKG interpreted, Old chart reviewed, Radiograph reviewed and Notes from prior ED visits reviewed and incorporated       Presentation most consistent with simple cellulitis that has failed oral outpatient biotics. Given History, Exam, and Workup I have low suspicion for Necrotizing Fasciitis, Abscess, Osteomyelitis, DVT or other emergent  problem as a cause for this presentation.  Started on empiric Rocephin and blood cultures drawn  Dispo: Admit to medicine        ____________________________________________   FINAL CLINICAL IMPRESSION(S) / ED DIAGNOSES  Final diagnoses:  None     ED Discharge Orders    None       Note:  This document was prepared using Dragon voice recognition software and may include unintentional dictation errors.   Merwyn Katos, MD 08/17/20 [phone]

## 2020-08-18 ENCOUNTER — Inpatient Hospital Stay: Payer: Commercial Managed Care - PPO

## 2020-08-18 DIAGNOSIS — I739 Peripheral vascular disease, unspecified: Secondary | ICD-10-CM | POA: Diagnosis not present

## 2020-08-18 DIAGNOSIS — Z720 Tobacco use: Secondary | ICD-10-CM | POA: Diagnosis not present

## 2020-08-18 DIAGNOSIS — N179 Acute kidney failure, unspecified: Secondary | ICD-10-CM | POA: Diagnosis not present

## 2020-08-18 DIAGNOSIS — N1831 Chronic kidney disease, stage 3a: Secondary | ICD-10-CM

## 2020-08-18 DIAGNOSIS — I96 Gangrene, not elsewhere classified: Secondary | ICD-10-CM | POA: Diagnosis not present

## 2020-08-18 LAB — CBC
HCT: 34.2 % — ABNORMAL LOW (ref 39.0–52.0)
Hemoglobin: 11.1 g/dL — ABNORMAL LOW (ref 13.0–17.0)
MCH: 36 pg — ABNORMAL HIGH (ref 26.0–34.0)
MCHC: 32.5 g/dL (ref 30.0–36.0)
MCV: 111 fL — ABNORMAL HIGH (ref 80.0–100.0)
Platelets: 210 10*3/uL (ref 150–400)
RBC: 3.08 MIL/uL — ABNORMAL LOW (ref 4.22–5.81)
RDW: 16.8 % — ABNORMAL HIGH (ref 11.5–15.5)
WBC: 6 10*3/uL (ref 4.0–10.5)
nRBC: 0 % (ref 0.0–0.2)

## 2020-08-18 LAB — BASIC METABOLIC PANEL
Anion gap: 14 (ref 5–15)
BUN: 52 mg/dL — ABNORMAL HIGH (ref 6–20)
CO2: 23 mmol/L (ref 22–32)
Calcium: 9.2 mg/dL (ref 8.9–10.3)
Chloride: 104 mmol/L (ref 98–111)
Creatinine, Ser: 1.9 mg/dL — ABNORMAL HIGH (ref 0.61–1.24)
GFR, Estimated: 40 mL/min — ABNORMAL LOW (ref >60)
Glucose, Bld: 81 mg/dL (ref 70–99)
Potassium: 4.4 mmol/L (ref 3.5–5.1)
Sodium: 141 mmol/L (ref 135–145)

## 2020-08-18 LAB — RESP PANEL BY RT-PCR (FLU A&B, COVID) ARPGX2
Influenza A by PCR: NEGATIVE
Influenza B by PCR: NEGATIVE
SARS Coronavirus 2 by RT PCR: NEGATIVE

## 2020-08-18 MED ORDER — ENSURE ENLIVE PO LIQD
237.0000 mL | Freq: Two times a day (BID) | ORAL | Status: DC
  Administered 2020-08-19: 08:00:00 237 mL via ORAL

## 2020-08-18 MED ORDER — LACTATED RINGERS IV SOLN: INTRAVENOUS | Status: DC

## 2020-08-18 MED ORDER — JUVEN PO PACK
1.0000 | PACK | Freq: Two times a day (BID) | ORAL | Status: DC
  Administered 2020-08-19: 1 via ORAL

## 2020-08-18 MED ORDER — OXYCODONE-ACETAMINOPHEN 5-325 MG PO TABS
1.0000 | ORAL_TABLET | ORAL | Status: DC | PRN
  Administered 2020-08-18 – 2020-08-19 (×5): 2 via ORAL
  Filled 2020-08-18 (×5): qty 2

## 2020-08-18 MED ORDER — MORPHINE SULFATE (PF) 2 MG/ML IV SOLN
2.0000 mg | INTRAVENOUS | Status: DC | PRN
  Administered 2020-08-18 (×2): 2 mg via INTRAVENOUS
  Filled 2020-08-18 (×2): qty 1

## 2020-08-18 MED ORDER — COLLAGENASE 250 UNIT/GM EX OINT
TOPICAL_OINTMENT | Freq: Every day | CUTANEOUS | Status: DC
  Filled 2020-08-18: qty 30

## 2020-08-18 MED ORDER — CEFDINIR 300 MG PO CAPS
300.0000 mg | ORAL_CAPSULE | Freq: Two times a day (BID) | ORAL | Status: DC
  Administered 2020-08-18 – 2020-08-19 (×3): 300 mg via ORAL
  Filled 2020-08-18 (×4): qty 1

## 2020-08-18 NOTE — Consult Note (Signed)
VASCULAR SURGERY CONSULTATION   Requested by:  Dr. Allena Katz  Reason for consultation: L toe ischemia    History of Present Illness   Cameron [name] is a 60 y.o. ([birth year]) male who presents with cc: left foot pain.  Pt is well known patient of Dr. Wyn Quaker:  On 06/2020, pt underwent:  PTA L peroneal, TPT, and AT  DCB PTA L SFA and pop   PTA+S L SFA and pop x 2  Essentially the patient had complete recannulation of femoropopliteal segment and angioplasty of all tibial arteries.  On follow-up, his ABI was 1.09 on 07/10/20.  The patient notes he has blistered on his dorsum of his foot and healed that several weeks ago.  He notes however he has not been able to heal some blistering on his L great toe.  Appears he then developed some cellulitis leading to his admission.   Past Medical History:  Diagnosis Date  . CHF (congestive heart failure) (HCC)   . Coronary artery disease    a. 12/17 PCI with DES to pLAD with resdiual disease (RX therapy)  . Hyperlipidemia   . Hypertension   . PAD (peripheral artery disease) (HCC)   . STEMI (ST elevation myocardial infarction) (HCC) 2017    Past Surgical History:  Procedure Laterality Date  . CARDIAC CATHETERIZATION N/A 06/17/2016   Procedure: Left Heart Cath and Coronary Angiography;  Surgeon: Runell Gess, MD;  Location: [facility] INVASIVE CV LAB;  Service: Cardiovascular;  Laterality: N/A;  . CARDIAC CATHETERIZATION N/A 06/17/2016   Procedure: Coronary Stent Intervention;  Surgeon: Runell Gess, MD;  Location: [facility] INVASIVE CV LAB;  Service: Cardiovascular;  Laterality: N/A;  . cardiac stents    . CERVICAL SPINE SURGERY    . LOWER EXTREMITY ANGIOGRAPHY Left 06/25/2020   Procedure: LOWER EXTREMITY ANGIOGRAPHY;  Surgeon: Annice Needy, MD;  Location: [facility] INVASIVE CV LAB;  Service: Cardiovascular;  Laterality: Left;     Social History   Socioeconomic History  . Marital status: Divorced    Spouse name: Not on file  . Number of children:  Not on file  . Years of education: Not on file  . Highest education level: Not on file  Occupational History  . Not on file  Tobacco Use  . Smoking status: Current Every Day Smoker    Packs/day: 1.00    Types: Cigarettes  . Smokeless tobacco: Never Used  Vaping Use  . Vaping Use: Never used  Substance and Sexual Activity  . Alcohol use: Not Currently    Comment: weekends  . Drug use: Yes    Frequency: 1.0 times per week    Types: Marijuana    Comment: twice monthly  . Sexual activity: Not on file  Other Topics Concern  . Not on file  Social History Narrative  . Not on file   Social Determinants of Health   Financial Resource Strain: Not on file  Food Insecurity: Not on file  Transportation Needs: Not on file  Physical Activity: Not on file  Stress: Not on file  Social Connections: Not on file  Intimate Partner Violence: Not on file    Family History  Problem Relation Age of Onset  . CAD Paternal Grandfather   . Breast cancer Mother   . Parkinson's disease Father   . Alzheimer's disease Father   . Hypertension Father   . Diabetes Father     Current Facility-Administered Medications  Medication Dose Route Frequency Provider Last Rate Last Admin  .  acetaminophen (TYLENOL) tablet 650 mg  650 mg Oral Q6H PRN Mansy, Jan A, MD       Or  . acetaminophen (TYLENOL) suppository 650 mg  650 mg Rectal Q6H PRN Mansy, Jan A, MD      . allopurinol (ZYLOPRIM) tablet 100 mg  100 mg Oral Daily Mansy, Jan A, MD   100 mg at 08/18/20 1036  . apixaban (ELIQUIS) tablet 5 mg  5 mg Oral BID Mansy, Jan A, MD   5 mg at 08/18/20 1036  . ascorbic acid (VITAMIN C) tablet 500 mg  500 mg Oral Daily Mansy, Jan A, MD   500 mg at 08/18/20 1036  . atorvastatin (LIPITOR) tablet 80 mg  80 mg Oral Daily Mansy, Jan A, MD   80 mg at 08/18/20 1036  . carvedilol (COREG) tablet 3.125 mg  3.125 mg Oral BID WC Mansy, Jan A, MD   3.125 mg at 08/18/20 1036  . cefTRIAXone (ROCEPHIN) 2 g in sodium chloride 0.9  % 100 mL IVPB  2 g Intravenous Q24H Merwyn Katos, MD   Stopped at 08/18/20 0027  . colchicine tablet 0.6 mg  0.6 mg Oral Daily PRN Mansy, Jan A, MD      . folic acid (FOLVITE) tablet 1 mg  1 mg Oral Daily Mansy, Jan A, MD   1 mg at 08/18/20 1036  . lactated ringers infusion   Intravenous Continuous Merwyn Katos, MD 150 mL/hr at 08/18/20 0300 New Bag at 08/18/20 0300  . magnesium hydroxide (MILK OF MAGNESIA) suspension 30 mL  30 mL Oral Daily PRN Mansy, Jan A, MD      . morphine 2 MG/ML injection 2 mg  2 mg Intravenous Q4H PRN Mansy, Jan A, MD   2 mg at 08/18/20 0526  . nitroGLYCERIN (NITROSTAT) SL tablet 0.4 mg  0.4 mg Sublingual Q5 min PRN Mansy, Jan A, MD      . ondansetron [facility]) tablet 4 mg  4 mg Oral Q6H PRN Mansy, Jan A, MD       Or  . ondansetron [facility]) injection 4 mg  4 mg Intravenous Q6H PRN Mansy, Jan A, MD      . oxyCODONE-acetaminophen (PERCOCET/ROXICET) 5-325 MG per tablet 1-2 tablet  1-2 tablet Oral Q4H PRN Enedina Finner, MD   2 tablet at 08/18/20 1035  . potassium chloride (KLOR-CON) CR tablet 10 mEq  10 mEq Oral Daily Mansy, Jan A, MD   10 mEq at 08/18/20 1036  . traZODone (DESYREL) tablet 25 mg  25 mg Oral QHS PRN Mansy, Jan A, MD      . vitamin B-12 (CYANOCOBALAMIN) tablet 1,000 mcg  1,000 mcg Oral Daily Mansy, Jan A, MD   1,000 mcg at 08/18/20 1036    No Known Allergies  REVIEW OF SYSTEMS (negative unless checked):   Cardiac:  []  Chest pain or chest pressure? []  Shortness of breath upon activity? []  Shortness of breath when lying flat? []  Irregular heart rhythm?  Vascular:  []  Pain in calf, thigh, or hip brought on by walking? []  Pain in feet at night that wakes you up from your sleep? []  Blood clot in your veins? []  Leg swelling?  Pulmonary:  []  Oxygen at home? []  Productive cough? []  Wheezing?  Neurologic:  []  Sudden weakness in arms or legs? []  Sudden numbness in arms or legs? []  Sudden onset of difficult speaking or slurred speech? []  Temporary  loss of vision in one eye? []  Problems with dizziness?  Gastrointestinal:  []   Blood in stool? []  Vomited blood?  Genitourinary:  []  Burning when urinating? []  Blood in urine?  Psychiatric:  []  Major depression  Hematologic:  []  Bleeding problems? []  Problems with blood clotting?  Dermatologic:  []  Rashes or ulcers? [x]  Blisters?   Constitutional:  []  Fever or chills?  Ear/Nose/Throat:  []  Change in hearing? []  Nose bleeds? []  Sore throat?  Musculoskeletal:  []  Back pain? []  Joint pain? []  Muscle pain?   Physical Examination     Vitals:   08/18/20 0130 08/18/20 0233 08/18/20 0541 08/18/20 0727  BP: 123/79 (!) 124/95 109/76 121/76  Pulse: 95 95 90 70  Resp: 16 16 16 18   Temp:  98.3 F (36.8 C) 98 F (36.7 C) 97.6 F (36.4 C)  TempSrc:  Oral Oral Oral  SpO2: 98% 99% 95% 100%  Weight:  75.9 kg    Height:  5\' 7"  (1.702 m)     Body mass index is 26.21 kg/m.  General Alert, O x 3, WD, NAD  Head Taylor/AT,    Ear/Nose/ Throat Hearing grossly intact, nares without erythema or drainage, oropharynx without Erythema or Exudate, Mallampati score: 3,   Eyes PERRLA, EOMI,    Neck Supple, mid-line trachea,    Pulmonary Sym exp, good B air movt, CTA B  Cardiac RRR, Nl S1, S2, no Murmurs, No rubs, No S3,S4  Vascular L AT and PT faintly palpable  Gastro- intestinal soft, non-distended, non-tender to palpation, No guarding or rebound, no HSM, no masses, no CVAT B, No palpable prominent aortic pulse,    Musculo- skeletal M/S 5/5 throughout except decreased DF/PF due to pain with testing, Some mild diffuse erythema on dorsum of foot, no ascending cellulitis, tip of L great toe with ischemic skin, no frank purulent drainage  Neurologic Cranial nerves grossly intact, Pain and light touch intact in extremities, Motor exam as listed above  Psychiatric Judgement intact, Mood & affect appropriate for pt's clinical situation  Dermatologic See M/S exam for extremity exam, No  rashes otherwise noted  Lymphatic  Palpable lymph nodes: None    Medical Decision Making   KNUT RONDINELLI is a 60 y.o. male who presents with: known LLE PAD s/p multiple endovascular interventions, cellulitis likely due to digital artery disease   I can faintly feel pulse in PT and AT, so doubt anything more can be optimized from a vascular viewpoint  There is no surgical or endovascular intervention for digital artery disease except amputation and wound care  Pt can get LLE ABI or duplex to verify intact perfusion  Agree with Podiatry consult for mgmt of digital artery disease  Pt already has follow up with Dr. in 2 months  Thank you for allowing to participate in this patient's care.   , MD, FACS, FSVS Covering for [facility]: [phone]  08/18/2020, 11:25 AM

## 2020-08-18 NOTE — Progress Notes (Addendum)
Erroneous note

## 2020-08-18 NOTE — Progress Notes (Signed)
Dressing change on left ankle and left greater toe done per MD order. Pt tolerated well.

## 2020-08-18 NOTE — Progress Notes (Addendum)
ORTHOPAEDIC CONSULTATION  REQUESTING PHYSICIAN: Enedina Finner, MD  Chief Complaint: Pain to left foot  HPI: Cameron [name] is a 60 y.o. male who complains of worsening pain in his left foot.  He had revascularization performed couple months ago by Dr. Wyn Quaker.  He has developed a heel ulcer as well as an ulcer on the tip of his left great toe.  He has had worsening pain.  Seen in outpatient urgent care facility and started on antibiotics and a topical but states that it did not improve and presented to the emergency department.  He states he has had a little bit of drainage from the area.  He has been out of work for a couple months secondary to the pain and ulcerations.  Does have a history of smoking.  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  Past Medical History:  Diagnosis Date  . CHF (congestive heart failure) (HCC)   . Coronary artery disease    a. 12/17 PCI with DES to pLAD with resdiual disease (RX therapy)  . Hyperlipidemia   . Hypertension   . PAD (peripheral artery disease) (HCC)   . STEMI (ST elevation myocardial infarction) (HCC) 2017   Past Surgical History:  Procedure Laterality Date  . CARDIAC CATHETERIZATION N/A 06/17/2016   Procedure: Left Heart Cath and Coronary Angiography;  Surgeon: Runell Gess, MD;  Location: [facility] INVASIVE CV LAB;  Service: Cardiovascular;  Laterality: N/A;  . CARDIAC CATHETERIZATION N/A 06/17/2016   Procedure: Coronary Stent Intervention;  Surgeon: Runell Gess, MD;  Location: [facility] INVASIVE CV LAB;  Service: Cardiovascular;  Laterality: N/A;  . cardiac stents    . CERVICAL SPINE SURGERY    . LOWER EXTREMITY ANGIOGRAPHY Left 06/25/2020   Procedure: LOWER EXTREMITY ANGIOGRAPHY;  Surgeon: Annice Needy, MD;  Location: [facility] INVASIVE CV LAB;  Service: Cardiovascular;  Laterality: Left;   Social History   Socioeconomic History  . Marital status: Divorced    Spouse name: Not on file  . Number  of children: Not on file  . Years of education: Not on file  . Highest education level: Not on file  Occupational History  . Not on file  Tobacco Use  . Smoking status: Current Every Day Smoker    Packs/day: 1.00    Types: Cigarettes  . Smokeless tobacco: Never Used  Vaping Use  . Vaping Use: Never used  Substance and Sexual Activity  . Alcohol use: Not Currently    Comment: weekends  . Drug use: Yes    Frequency: 1.0 times per week    Types: Marijuana    Comment: twice monthly  . Sexual activity: Not on file  Other Topics Concern  . Not on file  Social History Narrative  . Not on file   Social Determinants of Health   Financial Resource Strain: Not on file  Food Insecurity: Not on file  Transportation Needs: Not on file  Physical Activity: Not on file  Stress: Not on file  Social Connections: Not on file   Family History  Problem Relation Age of Onset  . CAD Paternal Grandfather   . Breast cancer Mother   . Parkinson's disease Father   . Alzheimer's disease Father   . Hypertension Father   . Diabetes Father    No Known Allergies Prior to Admission medications   Medication Sig Start Date End Date Taking? Authorizing Provider  allopurinol (ZYLOPRIM) 100 MG tablet Take  1 tablet (100 mg total) by mouth daily. 07/26/20 07/26/21 Yes Gillis Santa, MD  apixaban (ELIQUIS) 5 MG TABS tablet Take 1 tablet (5 mg total) by mouth 2 (two) times daily. 06/29/20 07/29/20 Yes Wieting, Richard, MD  ascorbic acid (VITAMIN C) 500 MG tablet Take 1 tablet (500 mg total) by mouth daily. 07/27/20  Yes Gillis Santa, MD  atorvastatin (LIPITOR) 80 MG tablet Take 1 tablet (80 mg total) by mouth daily. 02/21/20  Yes Charise Killian, MD  carvedilol (COREG) 3.125 MG tablet Take 1 tablet (3.125 mg total) by mouth 2 (two) times daily with a meal. 06/29/20  Yes Wieting, Richard, MD  colchicine 0.6 MG tablet Take 1 tablet (0.6 mg total) by mouth daily as needed. 07/26/20 08/25/20 Yes Gillis Santa, MD   folic acid (FOLVITE) 1 MG tablet Take 1 tablet (1 mg total) by mouth daily. 07/27/20  Yes Gillis Santa, MD  furosemide (LASIX) 40 MG tablet Take 0.5 tablets (20 mg total) by mouth 2 (two) times daily. 06/29/20 07/29/20 Yes Wieting, Richard, MD  HYDROcodone-acetaminophen (NORCO/VICODIN) 5-325 MG tablet Take 1 tablet by mouth every 6 (six) hours as needed for moderate pain. 07/26/20  Yes Gillis Santa, MD  lisinopril (ZESTRIL) 10 MG tablet Take 20 mg by mouth daily.   Yes [provider]  potassium chloride (KLOR-CON) 10 MEQ tablet Take 1 tablet (10 mEq total) by mouth daily. 06/29/20 07/29/20 Yes Wieting, Richard, MD  vitamin B-12 1000 MCG tablet Take 1 tablet (1,000 mcg total) by mouth daily. 06/30/20  Yes Wieting, Richard, MD  amiodarone (PACERONE) 200 MG tablet Take 200 mg by mouth 2 (two) times daily. Patient not taking: Reported on 08/17/2020 07/21/20   [provider]  ferrous sulfate 325 (65 FE) MG tablet Take 1 tablet (325 mg total) by mouth daily with breakfast. Patient not taking: Reported on 08/17/2020 06/29/20   Alford Highland, MD  metolazone (ZAROXOLYN) 2.5 MG tablet Take 2.5 mg by mouth daily. Patient not taking: Reported on 08/17/2020 08/01/20   [provider]  nitroGLYCERIN (NITROSTAT) 0.4 MG SL tablet Place 1 tablet (0.4 mg total) under the tongue every 5 (five) minutes as needed. Patient not taking: No sig reported 06/20/16   Laverda Page B, NP  polyethylene glycol (MIRALAX / GLYCOLAX) 17 g packet Take 17 g by mouth daily as needed for moderate constipation. Patient not taking: No sig reported 06/29/20   Alford Highland, MD  VENTOLIN HFA 108 [id number] Base) MCG/ACT inhaler Inhale 2 puffs into the lungs every 6 (six) hours as needed for wheezing or shortness of breath. Patient not taking: No sig reported 06/29/20   Alford Highland, MD  isosorbide mononitrate (IMDUR) 60 MG 24 hr tablet Take 1 tablet (60 mg total) by mouth daily. 02/04/18 05/30/19  [name], MD    DG Foot Complete Left  Result Date: 08/17/2020 CLINICAL DATA:  Infected wound.  Blisters on top of foot. EXAM: LEFT FOOT - COMPLETE 3+ VIEW COMPARISON:  None. FINDINGS: There is no evidence of fracture or dislocation. No erosion, bony destruction, or periosteal reaction. Pes cavus. Mild midfoot osteoarthritis. Plantar calcaneal spur. Soft tissue edema overlies the dorsum of the foot. Possible plantar soft tissue edema. No radiopaque foreign body or tracking soft tissue air. IMPRESSION: 1. Soft tissue edema over the dorsum and possibly plantar aspect. No radiographic evidence of osteomyelitis. 2. Pes cavus and mild midfoot osteoarthritis. 3. Plantar calcaneal spur. Electronically Signed   By: Narda Rutherford M.D.   On: 08/17/2020 19:19  Positive ROS: All other systems have been reviewed and were otherwise negative with the exception of those mentioned in the HPI and as above.  12 point ROS was performed.  Physical Exam: General: Alert and oriented.  No apparent distress.  Vascular:  Left foot:Dorsalis Pedis:  absent Posterior Tibial:  absent  Right foot: Dorsalis Pedis:  absent Posterior Tibial:  absent  Neuro:intact gross sensation  Derm: On the distal aspect of the left great toe is a 1/2 cm dry superficial necrotic area.  No surrounding erythema.  No active drainage.  Also on the left heel is a 2 cm superficial area of necrotic tissue as well.  Neither of these are actively infected.  These are painful with palpation.  Right foot without ulceration or wound.  Skin is completely intact.  Ortho/MS: No edema.  Assessment: Superficial necrotic ulceration distal left great toe and posterior left heel Severe peripheral vascular disease and peripheral arterial disease  Plan: These are dry necrotic areas of ulceration.  These are likely secondary to vascular compromise as well as pressure and friction to the region.  Will order wound care for both the heel and distal aspect of the left  great toe.  Basically he needs all pressure removed from either of these areas to have any chance of seeing this heal.  He can follow-up in the outpatient clinic in 2 to 3 weeks.  No acute need for surgical debridement.  Recommend follow-up with vascular surgery for reevaluation    Irean Hong, DPM Cell [phone]   08/18/2020 11:31 AM

## 2020-08-18 NOTE — Progress Notes (Signed)
Initial Nutrition Assessment  DOCUMENTATION CODES:   Not applicable  INTERVENTION:  Ensure Enlive po BID, each supplement provides 350 kcal and 20 grams of protein  Juven BID, each packet provides 95 calories, 2.5 grams of protein (collagen), and 9.8 grams of carbohydrate (3 grams sugar); also contains 7 grams of L-arginine and L-glutamine, 300 mg vitamin C, 15 mg vitamin E, 1.2 mcg vitamin B-12, 9.5 mg zinc, 200 mg calcium, and 1.5 g  Calcium Beta-hydroxy-Beta-methylbutyrate to support wound healing  MVI with minerals po daily   NUTRITION DIAGNOSIS:   Increased nutrient needs related to wound healing as evidenced by estimated needs.    GOAL:   Patient will meet greater than or equal to 90% of their needs    MONITOR:   Labs,I & O's,Supplement acceptance,PO intake,Weight trends,Skin,Diet advancement  REASON FOR ASSESSMENT:   Malnutrition Screening Tool    ASSESSMENT:  60 year old male admitted with dry gangrene of left foot secondary to infected wound. Past medical history significant for CHF, CAD s/p PCI and stent, HTN, HLD, PAD, Gout, CKD stage IIIa, paroxysmal atrial fibrillation. Pt was recently treated for left foot cellulitis outpatient and presents due to recently developed blisters with ulcer and scabbing and worsening pain with ambulation.  RD working remotely.  Pt evaluated by podiatry, no acute need for surgical debridement, wound care ordered. No surgical or endovascular intervention for digital artery disease except amputation and wound care. Pt underwent complete recannulation of femoropopliteal segment and angioplasty of all tibial arteries on 12/2. Pt seen by vascular, noted faint pulse in PT and AT, unlikely any further optimization can be achieved.   He is receiving a HH/CM diet, no documented meals at this time. He has increased needs secondary to wounds, and heart healthy portion is restrictive of protein. Pt with no history of DM, last A1c 5.4 in August  2021, carb modified portion of diet is not warranted. Spoke with MD via secure chat, okay to liberalize to heart healthy diet. Will order Ensure Enlive to help him meet his needs as well as Juven to support wound healing.   Pt with follow-up previously scheduled with Dr. Wyn Quaker in 2 months.    I/Os: +484.9 ml since admit UOP: 440 ml since admit  Per chart, weights have trended down ~17 lbs (9.5%) in the last month; significant. Given chronic foot wound, decreased ambulation, with chronic co-morbidities, suspect degree of malnutrition, however unable to identify at this time. Will plan to complete exam at follow-up.  Medications reviewed and include: Vit C, Coreg, Cefdinir, Folic acid, Klor-con, B12  LR @ 75 ml/hr  Labs: BUN (H), Cr 1.90 (H) A1c 5.4 on 02/18/20 (WNL)   NUTRITION - FOCUSED PHYSICAL EXAM:  Unable to complete at this time  Diet Order:   Diet Order            Diet heart healthy/carb modified Room service appropriate? Yes; Fluid consistency: Thin  Diet effective now                 EDUCATION NEEDS:   Not appropriate for education at this time  Skin:  Skin Assessment: Skin Integrity Issues: Skin Integrity Issues:: Other (Comment) Other: cellulitis; L foot  Last BM:  2/11  Height:   Ht Readings from Last 1 Encounters:  08/18/20 5\' 7"  (1.702 m)    Weight:   Wt Readings from Last 1 Encounters:  08/18/20 75.9 kg    BMI:  Body mass index is 26.21 kg/m.  Estimated Nutritional  Needs:   Kcal:  2300-2500  Protein:  120-130  Fluid:  >/= 2.3 L/day   Lars Masson, RD, LDN Clinical Nutrition After Hours/Weekend Pager # in Amion

## 2020-08-18 NOTE — Progress Notes (Signed)
[facility]  - [facility]   PATIENT NAME: Cameron [name]    MR#:  [id number]  DATE OF BIRTH:  10/23/1960  SUBJECTIVE:    REVIEW OF SYSTEMS:   ROS Tolerating Diet: Tolerating PT:   DRUG ALLERGIES:  No Known Allergies  VITALS:  Blood pressure 103/71, pulse 75, temperature 97.6 F (36.4 C), temperature source Oral, resp. rate 15, height 5\' 7"  (1.702 m), weight 75.9 kg, SpO2 93 %.  PHYSICAL EXAMINATION:   Physical Exam  GENERAL:  60 y.o.-year-old patient lying in the bed with no acute distress.  oid enlargement, no tenderness.  LUNGS: Normal breath sounds bilaterally, no wheezing, rales, rhonchi. No use of accessory muscles of respiration.  CARDIOVASCULAR: S1, S2 normal. No murmurs, rubs, or gallops.  ABDOMEN: Soft, nontender, nondistended. Bowel sounds present. No organomegaly or mass.  EXTREMITIES:      NEUROLOGIC: Cranial nerves II through XII are intact. No focal Motor deficits b/l.   PSYCHIATRIC:  patient is alert and oriented x 3.  SKIN: as above   LABORATORY PANEL:  CBC Recent Labs  Lab 08/18/20 0424  WBC 6.0  HGB 11.1*  HCT 34.2*  PLT 210    Chemistries  Recent Labs  Lab 08/18/20 0424  NA 141  K 4.4  CL 104  CO2 23  GLUCOSE 81  BUN 52*  CREATININE 1.90*  CALCIUM 9.2   Cardiac Enzymes No results for input(s): TROPONINI in the last 168 hours. RADIOLOGY:  DG Foot Complete Left  Result Date: 08/17/2020 CLINICAL DATA:  Infected wound.  Blisters on top of foot. EXAM: LEFT FOOT - COMPLETE 3+ VIEW COMPARISON:  None. FINDINGS: There is no evidence of fracture or dislocation. No erosion, bony destruction, or periosteal reaction. Pes cavus. Mild midfoot osteoarthritis. Plantar calcaneal spur. Soft tissue edema overlies the dorsum of the foot. Possible plantar soft tissue edema. No radiopaque foreign body or tracking soft tissue air. IMPRESSION: 1. Soft tissue edema over the dorsum and possibly plantar aspect. No radiographic  evidence of osteomyelitis. 2. Pes cavus and mild midfoot osteoarthritis. 3. Plantar calcaneal spur. Electronically Signed   By: 10/15/2020 M.D.   On: 08/17/2020 19:19   ASSESSMENT AND PLAN:  JEVEN TOPPER is a 60 y.o. Caucasian male with medical history significant for CHF, coronary artery disease status post PCI and stent, hypertension, dyslipidemia and peripheral arterial disease was recently treated for left foot cellulitis on outpatient basis with p.o. Keflex and antibiotic ointment and is recently developed blisters then ulcer and scabbing and has been having worsening pain with ambulation  Left foot  Dry gangrene with recent  outpatient treatment, secondary to infected wound. -no acute drainage -seen by Vascular sx Dr 46-- recommends follow-up with Dr. Imogene Burn as outpatient. Patient has ongoing history of smoking and severe peripheral vascular disease. -- He had recent angioplasty times three and stent times two placement and left lower extremity in December 2021 -- IV Rocephin. White count normal. Lactic acid down to 1.7. Will switch to PO Cefdinir -- patient will need to follow-up closely with podiatry. Follow-up wound care by Dr. January 2022. -- Patient advised to remove pressure from either of these areas to have chance of healing. -- Received IV fluids. -- Lactic acid 2.2--- 1.7  Acute kidney injury on chronic kidney disease stage IIIa -- baseline creatinine 114--1.3 -- him in with creatinine of 2.3--- down to 1.9 -We will hold off diuretics and nephrotoxins.    Essential hypertension. - continue his Coreg and  hold off his lisinopril given acute kidney injury.    Gout. -continue his allopurinol and as needed colchicine.   Dyslipidemia. -Statin therapy will be resumed.    Coronary artery disease status post PCI and stent. -Statin therapy beta-blocker therapy and aspirin    Paroxysmal atrial fibrillation. -continue his Eliquis and amiodarone.  DVT prophylaxis:  Eliquis. Code Status: full code. Family Communication:  The plan of care was discussed in details with the patient who requested no family notification at this time. I Disposition Plan: Back to previous home environment likley 2/13 Consults called: none.  Procedures: Family communication : Consults : CODE STATUS:  DVT Prophylaxis : Level of care: Med-Surg Status is: Inpatient  Remains inpatient appropriate because:Ongoing active pain requiring inpatient pain management   Dispo: The patient is from: Home              Anticipated d/c is to: Home              Anticipated d/c date is: 1 day              Patient currently is medically stable to d/c.   Difficult to place patient No        TOTAL TIME TAKING CARE OF THIS PATIENT: 25 minutes.  >50% time spent on counselling and coordination of care  Note: This dictation was prepared with Dragon dictation along with smaller phrase technology. Any transcriptional errors that result from this process are unintentional.  Enedina Finner M.D    [facility]   CC: [facility]; [facility]Patient ID: Angelica Chessman, male   DOB: May 06, 1961, 60 y.o.   MRN: [id number]

## 2020-08-19 DIAGNOSIS — N179 Acute kidney failure, unspecified: Secondary | ICD-10-CM | POA: Diagnosis not present

## 2020-08-19 DIAGNOSIS — Z720 Tobacco use: Secondary | ICD-10-CM | POA: Diagnosis not present

## 2020-08-19 DIAGNOSIS — I96 Gangrene, not elsewhere classified: Secondary | ICD-10-CM | POA: Diagnosis not present

## 2020-08-19 DIAGNOSIS — N1831 Chronic kidney disease, stage 3a: Secondary | ICD-10-CM | POA: Diagnosis not present

## 2020-08-19 LAB — BASIC METABOLIC PANEL
Anion gap: 9 (ref 5–15)
BUN: 33 mg/dL — ABNORMAL HIGH (ref 6–20)
CO2: 26 mmol/L (ref 22–32)
Calcium: 8.7 mg/dL — ABNORMAL LOW (ref 8.9–10.3)
Chloride: 104 mmol/L (ref 98–111)
Creatinine, Ser: 1.39 mg/dL — ABNORMAL HIGH (ref 0.61–1.24)
GFR, Estimated: 58 mL/min — ABNORMAL LOW (ref >60)
Glucose, Bld: 102 mg/dL — ABNORMAL HIGH (ref 70–99)
Potassium: 3.9 mmol/L (ref 3.5–5.1)
Sodium: 139 mmol/L (ref 135–145)

## 2020-08-19 MED ORDER — LISINOPRIL 2.5 MG PO TABS: 2.5000 mg | ORAL_TABLET | Freq: Every day | ORAL | 0 refills | Status: DC

## 2020-08-19 MED ORDER — OXYCODONE-ACETAMINOPHEN 5-325 MG PO TABS: 1.0000 | ORAL_TABLET | Freq: Three times a day (TID) | ORAL | 0 refills | Status: DC | PRN

## 2020-08-19 MED ORDER — CEFDINIR 300 MG PO CAPS: 300.0000 mg | ORAL_CAPSULE | Freq: Two times a day (BID) | ORAL | 0 refills | Status: AC

## 2020-08-19 MED ORDER — FUROSEMIDE 40 MG PO TABS: 20.0000 mg | ORAL_TABLET | Freq: Two times a day (BID) | ORAL | 1 refills | Status: DC

## 2020-08-19 MED ORDER — COLLAGENASE 250 UNIT/GM EX OINT: TOPICAL_OINTMENT | Freq: Every day | CUTANEOUS | 0 refills | Status: DC

## 2020-08-19 MED ORDER — ENSURE ENLIVE PO LIQD: 237.0000 mL | Freq: Two times a day (BID) | ORAL | 12 refills | Status: DC

## 2020-08-19 NOTE — TOC Transition Note (Signed)
Transition of Care [facility]) - CM/SW Discharge Note   Patient Details  Name: Cameron [name] MRN: [id number] Date of Birth: 1961/01/21  Transition of [facility] CM/SW Contact:  Bing Quarry, RN Phone Number: 08/19/2020, 10:55 AM   Clinical Narrative:   08/19/20 Patient has discharge orders, with wound care instructions. No HH or DME needs. Uses [facility]. No TOC needs at this time. Gabriel Cirri RN CM    Final next level of care: Home/Self Care Barriers to Discharge: Barriers Resolved   Patient Goals and CMS Choice     Choice offered to / list presented to : NA  Discharge Placement                       Discharge Plan and Services                DME Arranged: N/A DME Agency: NA         HH Agency: NA        Social Determinants of Health (SDOH) Interventions     Readmission Risk Interventions No flowsheet data found.

## 2020-08-19 NOTE — Discharge Summary (Signed)
[facility] [place] [facility]   PATIENT NAME: Cameron [name]    MR#:  [id number]  DATE OF BIRTH:  1960-09-30  DATE OF ADMISSION:  08/17/2020 ADMITTING PHYSICIAN: Hannah Beat, MD  DATE OF DISCHARGE: 08/19/2020  PRIMARY CARE PHYSICIAN: [facility]    ADMISSION DIAGNOSIS:  Cellulitis of left foot [L03.116] Cellulitis of toe of right foot [L03.031] Cellulitis of heel, right [L03.115]  DISCHARGE DIAGNOSIS:  Dry Gangrene of left distal great toe and left heel H/o PAD with ongoing tobacco abuse  SECONDARY DIAGNOSIS:   Past Medical History:  Diagnosis Date  . CHF (congestive heart failure) (HCC)   . Coronary artery disease    a. 12/17 PCI with DES to pLAD with resdiual disease (RX therapy)  . Hyperlipidemia   . Hypertension   . PAD (peripheral artery disease) (HCC)   . STEMI (ST elevation myocardial infarction) [facility]) 2017    HOSPITAL COURSE:  Cameron [name] a 60 y.o.Caucasian malewith medical history significant forCHF, coronary artery disease status post PCI and stent, hypertension, dyslipidemia and peripheral arterial disease was recently treated for left foot cellulitis on outpatient basis with p.o. Keflex and antibiotic ointmentand is recently developed blistersthen ulcer and scabbing and has beenhaving worsening pain with ambulation  Left foot  Dry gangrene with recent  outpatient treatment, secondary to infected wound. -no acute drainage -seen by Vascular sx Dr Imogene Burn-- recommends follow-up with Dr. Wyn Quaker as outpatient. Patient has ongoing history of smoking and severe peripheral vascular disease. -- He had recent angioplasty times three and stent times two placement and left lower extremity in December 2021 -- IV Rocephin. White count normal. Lactic acid down to 1.7. Will switch to PO Cefdinir -- patient will need to follow-up closely with podiatry. cont wound care by Dr. Ether Griffins. -- Patient advised to remove pressure from either of  these areas to have chance of healing. He is advised to have close f/u with podiatry -- Received IV fluids. -- Lactic acid 2.2--- 1.7  Acute kidney injury on chronic kidney disease stage IIIa -- baseline creatinine 114--1.3 -- him in with creatinine of 2.3--- down to 1.9--1.3    Essential hypertension. - continue his Coreg and resumed lisinopril at low dose 2.5 mg due to soft BP   Gout. -continue his allopurinol and as needed colchicine.  Dyslipidemia. -Statin therapy will be resumed.   Coronary artery disease status post PCI and stent. -Statin therapy beta-blocker therapy and aspirin   Paroxysmal atrial fibrillation. -continue his Eliquis --pt has stopped his amiodarone. HR in the 70's-80's  DVT prophylaxis:Eliquis. Code Status:full code. Family Communication:The plan of care was discussed in details with the patientwho requested no family notification at this time. I Disposition Plan:Back to previous home environment today Consults called:none.   Remains inpatient appropriate because:Ongoing active pain requiring inpatient pain management   Dispo: The patient is from: Home  Anticipated d/c is to: Home  Patient currently is medically stable to d/c.              Difficult to place patient No   CONSULTS OBTAINED:    DRUG ALLERGIES:  No Known Allergies  DISCHARGE MEDICATIONS:   Allergies as of 08/19/2020   No Known Allergies     Medication List    STOP taking these medications   amiodarone 200 MG tablet Commonly known as: PACERONE   ferrous sulfate 325 (65 FE) MG tablet   HYDROcodone-acetaminophen 5-325 MG tablet Commonly known as: NORCO/VICODIN   metolazone 2.5  MG tablet Commonly known as: ZAROXOLYN   nitroGLYCERIN 0.4 MG SL tablet Commonly known as: Nitrostat   polyethylene glycol 17 g packet Commonly known as: MIRALAX / GLYCOLAX   Ventolin HFA 108 (90 Base) MCG/ACT inhaler Generic drug:  albuterol     TAKE these medications   allopurinol 100 MG tablet Commonly known as: Zyloprim Take 1 tablet (100 mg total) by mouth daily.   apixaban 5 MG Tabs tablet Commonly known as: ELIQUIS Take 1 tablet (5 mg total) by mouth 2 (two) times daily.   ascorbic acid 500 MG tablet Commonly known as: VITAMIN C Take 1 tablet (500 mg total) by mouth daily.   atorvastatin 80 MG tablet Commonly known as: LIPITOR Take 1 tablet (80 mg total) by mouth daily.   carvedilol 3.125 MG tablet Commonly known as: COREG Take 1 tablet (3.125 mg total) by mouth 2 (two) times daily with a meal.   cefdinir 300 MG capsule Commonly known as: OMNICEF Take 1 capsule (300 mg total) by mouth every 12 (twelve) hours for 5 days.   colchicine 0.6 MG tablet Take 1 tablet (0.6 mg total) by mouth daily as needed.   collagenase ointment Commonly known as: SANTYL Apply topically daily. Start taking on: August 20, 2020   cyanocobalamin 1000 MCG tablet Take 1 tablet (1,000 mcg total) by mouth daily.   feeding supplement Liqd Take 237 mLs by mouth 2 (two) times daily between meals.   folic acid 1 MG tablet Commonly known as: FOLVITE Take 1 tablet (1 mg total) by mouth daily.   furosemide 40 MG tablet Commonly known as: Lasix Take 0.5 tablets (20 mg total) by mouth 2 (two) times daily.   lisinopril 2.5 MG tablet Commonly known as: ZESTRIL Take 1 tablet (2.5 mg total) by mouth daily. What changed:  medication strength how much to take   oxyCODONE-acetaminophen 5-325 MG tablet Commonly known as: PERCOCET/ROXICET Take 1 tablet by mouth every 8 (eight) hours as needed for severe pain.   potassium chloride 10 MEQ tablet Commonly known as: KLOR-CON Take 1 tablet (10 mEq total) by mouth daily.            Discharge Care Instructions  (From admission, onward)         Start     Ordered   08/19/20 0000  Discharge wound care:       Comments: Every other day dressing changes: Santyl to  distal left great toe. Covered with silicone border padded foam to toe and left heel   08/19/20 0833          If you experience worsening of your admission symptoms, develop shortness of breath, life threatening emergency, suicidal or homicidal thoughts you must seek medical attention immediately by calling 911 or calling your MD immediately  if symptoms less severe.  You Must read complete instructions/literature along with all the possible adverse reactions/side effects for all the Medicines you take and that have been prescribed to you. Take any new Medicines after you have completely understood and accept all the possible adverse reactions/side effects.   Please note  You were cared for by a hospitalist during your hospital stay. If you have any questions about your discharge medications or the care you received while you were in the hospital after you are discharged, you can call the unit and asked to speak with the hospitalist on call if the hospitalist that took care of you is not available. Once you are discharged, your primary care physician will  handle any further medical issues. Please note that NO REFILLS for any discharge medications will be authorized once you are discharged, as it is imperative that you return to your primary care physician (or establish a relationship with a primary care physician if you do not have one) for your aftercare needs so that they can reassess your need for medications and monitor your lab values. Today   SUBJECTIVE   Foot pain. No fever  VITAL SIGNS:  Blood pressure 102/63, pulse 68, temperature 97.8 F (36.6 C), resp. rate 16, height 5\' 7"  (1.702 m), weight 75.9 kg, SpO2 93 %.  I/O:    Intake/Output Summary (Last 24 hours) at 08/19/2020 0835 Last data filed at 08/19/2020 0219 Gross per 24 hour  Intake 526.25 ml  Output 980 ml  Net -453.75 ml    PHYSICAL EXAMINATION:  GENERAL:  61 y.o.-year-old patient lying in the bed with no acute  distress.  LUNGS: Normal breath sounds bilaterally, no wheezing, rales,rhonchi or crepitation. No use of accessory muscles of respiration.  CARDIOVASCULAR: S1, S2 normal. No murmurs, rubs, or gallops.  ABDOMEN: Soft, non-tender, non-distended. Bowel sounds present. No organomegaly or mass.  EXTREMITIES:     cle strength 5/5 in all extremities. Sensation intact. Gait not checked.  PSYCHIATRIC: The patient is alert and oriented x 3.  SKIN: No obvious rash, lesion, or ulcer.   DATA REVIEW:   CBC  Recent Labs  Lab 08/18/20 0424  WBC 6.0  HGB 11.1*  HCT 34.2*  PLT 210    Chemistries  Recent Labs  Lab 08/19/20 0536  NA 139  K 3.9  CL 104  CO2 26  GLUCOSE 102*  BUN 33*  CREATININE 1.39*  CALCIUM 8.7*    Microbiology Results   Recent Results (from the past 240 hour(s))  Culture, blood (single)     Status: None (Preliminary result)   Collection Time: 08/17/20 11:00 PM   Specimen: BLOOD  Result Value Ref Range Status   Specimen Description BLOOD RIGHT ANTECUBITAL  Final   Special Requests   Final    BOTTLES DRAWN AEROBIC AND ANAEROBIC Blood Culture adequate volume   Culture   Final    NO GROWTH 2 DAYS Performed at [facility], [address], [place], [place]    Report Status PENDING  Incomplete  Resp Panel by RT-PCR (Flu A&B, Covid) Nasopharyngeal Swab     Status: None   Collection Time: 08/18/20  1:12 AM   Specimen: Nasopharyngeal Swab; Nasopharyngeal(NP) swabs in vial transport medium  Result Value Ref Range Status   SARS Coronavirus 2 by RT PCR NEGATIVE NEGATIVE Final    Comment: (NOTE) SARS-CoV-2 target nucleic acids are NOT DETECTED.  The SARS-CoV-2 RNA is generally detectable in upper respiratory specimens during the acute phase of infection. The lowest concentration of SARS-CoV-2 viral copies this assay can detect is 138 copies/mL. A negative result does not preclude SARS-Cov-2 infection and should not be used as the sole basis for  treatment or other patient management decisions. A negative result may occur with  improper specimen collection/handling, submission of specimen other than nasopharyngeal swab, presence of viral mutation(s) within the areas targeted by this assay, and inadequate number of viral copies(<138 copies/mL). A negative result must be combined with clinical observations, patient history, and epidemiological information. The expected result is Negative.  Fact Sheet for Patients:  10/16/20  Fact Sheet for Healthcare Providers:  BloggerCourse.com  This test is no t yet approved or cleared by the  [place] [employer] and  has been authorized for detection and/or diagnosis of SARS-CoV-2 by FDA under an TEFL teacher (EUA). This EUA will remain  in effect (meaning this test can be used) for the duration of the COVID-19 declaration under Section 564(b)(1) of the Act, 21 U.S.C.section 360bbb-3(b)(1), unless the authorization is terminated  or revoked sooner.       Influenza A by PCR NEGATIVE NEGATIVE Final   Influenza B by PCR NEGATIVE NEGATIVE Final    Comment: (NOTE) The Xpert Xpress SARS-CoV-2/FLU/RSV plus assay is intended as an aid in the diagnosis of influenza from Nasopharyngeal swab specimens and should not be used as a sole basis for treatment. Nasal washings and aspirates are unacceptable for Xpert Xpress SARS-CoV-2/FLU/RSV testing.  Fact Sheet for Patients: BloggerCourse.com  Fact Sheet for Healthcare Providers: SeriousBroker.it  This test is not yet approved or cleared by the [place] FDA and has been authorized for detection and/or diagnosis of SARS-CoV-2 by FDA under an Emergency Use Authorization (EUA). This EUA will remain in effect (meaning this test can be used) for the duration of the COVID-19 declaration under Section 564(b)(1) of the Act, 21  U.S.C. section 360bbb-3(b)(1), unless the authorization is terminated or revoked.  Performed at [facility], [address] [place], [place] [address]     RADIOLOGY:  [place] Foot Complete Left  Result Date: 08/17/2020 CLINICAL DATA:  Infected wound.  Blisters on top of foot. EXAM: LEFT FOOT - COMPLETE 3+ VIEW COMPARISON:  None. FINDINGS: There is no evidence of fracture or dislocation. No erosion, bony destruction, or periosteal reaction. Pes cavus. Mild midfoot osteoarthritis. Plantar calcaneal spur. Soft tissue edema overlies the dorsum of the foot. Possible plantar soft tissue edema. No radiopaque foreign body or tracking soft tissue air. IMPRESSION: 1. Soft tissue edema over the dorsum and possibly plantar aspect. No radiographic evidence of osteomyelitis. 2. Pes cavus and mild midfoot osteoarthritis. 3. Plantar calcaneal spur. Electronically Signed   By: Narda Rutherford M.D.   On: 08/17/2020 19:19     CODE STATUS:     Code Status Orders  (From admission, onward)         Start     Ordered   08/17/20 2251  Full code  Continuous        08/17/20 2251        Code Status History    Date Active Date Inactive Code Status Order ID Comments User Context   07/24/2020 2240 07/26/2020 1932 Full Code [id number]  Lorretta Harp, MD Inpatient   06/23/2020 2101 06/29/2020 1725 Full Code [id number]  Bobette Mo, MD ED   02/17/2020 0801 02/21/2020 1917 Full Code [id number]  Lorretta Harp, MD ED   02/02/2018 0414 02/03/2018 2118 Full Code [id number]  Arnaldo Natal, MD Inpatient   06/17/2016 0630 06/20/2016 1840 Full Code [id number]  Runell Gess, MD Inpatient   Advance Care Planning Activity       TOTAL TIME TAKING CARE OF THIS PATIENT: *35* minutes.    Enedina Finner M.D  [facility]    CC: Primary care physician; [facility]

## 2020-08-19 NOTE — Discharge Instructions (Addendum)
Patient advised to keep pressure off left foot as much possible.  He is recommended to have a close follow-up with podiatry Dr. Ether Griffins   Smoking cessation advised

## 2020-08-19 NOTE — Progress Notes (Signed)
Pt will be discharging at 12 noon or sooner as he was given 2 pain pills at 8am and is not safe to drive at this time

## 2020-08-22 LAB — CULTURE, BLOOD (SINGLE)
Culture: NO GROWTH
Special Requests: ADEQUATE

## 2020-10-08 ENCOUNTER — Other Ambulatory Visit (INDEPENDENT_AMBULATORY_CARE_PROVIDER_SITE_OTHER): Payer: Self-pay | Admitting: Vascular Surgery

## 2020-10-08 DIAGNOSIS — I739 Peripheral vascular disease, unspecified: Secondary | ICD-10-CM

## 2020-10-09 ENCOUNTER — Encounter (INDEPENDENT_AMBULATORY_CARE_PROVIDER_SITE_OTHER): Payer: Commercial Managed Care - PPO

## 2020-10-09 ENCOUNTER — Ambulatory Visit (INDEPENDENT_AMBULATORY_CARE_PROVIDER_SITE_OTHER): Payer: Commercial Managed Care - PPO | Admitting: Vascular Surgery

## 2021-03-31 ENCOUNTER — Inpatient Hospital Stay
Admission: EM | Admit: 2021-03-31 | Discharge: 2021-04-11 | DRG: 418 | Disposition: A | Discharge status: Home Health | Payer: Commercial Managed Care - PPO | Attending: Surgery | Admitting: Surgery

## 2021-03-31 ENCOUNTER — Emergency Department: Payer: Commercial Managed Care - PPO

## 2021-03-31 ENCOUNTER — Other Ambulatory Visit: Payer: Self-pay

## 2021-03-31 DIAGNOSIS — I4891 Unspecified atrial fibrillation: Secondary | ICD-10-CM | POA: Diagnosis present

## 2021-03-31 DIAGNOSIS — I255 Ischemic cardiomyopathy: Secondary | ICD-10-CM | POA: Diagnosis present

## 2021-03-31 DIAGNOSIS — K8000 Calculus of gallbladder with acute cholecystitis without obstruction: Secondary | ICD-10-CM | POA: Diagnosis present

## 2021-03-31 DIAGNOSIS — I959 Hypotension, unspecified: Secondary | ICD-10-CM | POA: Diagnosis not present

## 2021-03-31 DIAGNOSIS — E875 Hyperkalemia: Secondary | ICD-10-CM | POA: Diagnosis not present

## 2021-03-31 DIAGNOSIS — E876 Hypokalemia: Secondary | ICD-10-CM | POA: Diagnosis present

## 2021-03-31 DIAGNOSIS — Z79899 Other long term (current) drug therapy: Secondary | ICD-10-CM

## 2021-03-31 DIAGNOSIS — K7460 Unspecified cirrhosis of liver: Secondary | ICD-10-CM | POA: Diagnosis present

## 2021-03-31 DIAGNOSIS — Z833 Family history of diabetes mellitus: Secondary | ICD-10-CM

## 2021-03-31 DIAGNOSIS — I5022 Chronic systolic (congestive) heart failure: Secondary | ICD-10-CM | POA: Diagnosis present

## 2021-03-31 DIAGNOSIS — I2510 Atherosclerotic heart disease of native coronary artery without angina pectoris: Secondary | ICD-10-CM | POA: Diagnosis present

## 2021-03-31 DIAGNOSIS — N182 Chronic kidney disease, stage 2 (mild): Secondary | ICD-10-CM | POA: Diagnosis present

## 2021-03-31 DIAGNOSIS — I4892 Unspecified atrial flutter: Secondary | ICD-10-CM | POA: Diagnosis present

## 2021-03-31 DIAGNOSIS — E785 Hyperlipidemia, unspecified: Secondary | ICD-10-CM | POA: Diagnosis present

## 2021-03-31 DIAGNOSIS — N2581 Secondary hyperparathyroidism of renal origin: Secondary | ICD-10-CM | POA: Diagnosis present

## 2021-03-31 DIAGNOSIS — K760 Fatty (change of) liver, not elsewhere classified: Secondary | ICD-10-CM | POA: Diagnosis present

## 2021-03-31 DIAGNOSIS — M109 Gout, unspecified: Secondary | ICD-10-CM | POA: Diagnosis present

## 2021-03-31 DIAGNOSIS — I252 Old myocardial infarction: Secondary | ICD-10-CM

## 2021-03-31 DIAGNOSIS — D631 Anemia in chronic kidney disease: Secondary | ICD-10-CM | POA: Diagnosis present

## 2021-03-31 DIAGNOSIS — N1411 Contrast-induced nephropathy: Secondary | ICD-10-CM | POA: Diagnosis not present

## 2021-03-31 DIAGNOSIS — M898X9 Other specified disorders of bone, unspecified site: Secondary | ICD-10-CM | POA: Diagnosis present

## 2021-03-31 DIAGNOSIS — I700 Atherosclerosis of aorta: Secondary | ICD-10-CM | POA: Diagnosis present

## 2021-03-31 DIAGNOSIS — K819 Cholecystitis, unspecified: Secondary | ICD-10-CM | POA: Diagnosis present

## 2021-03-31 DIAGNOSIS — R188 Other ascites: Secondary | ICD-10-CM | POA: Diagnosis present

## 2021-03-31 DIAGNOSIS — I739 Peripheral vascular disease, unspecified: Secondary | ICD-10-CM | POA: Diagnosis present

## 2021-03-31 DIAGNOSIS — Z803 Family history of malignant neoplasm of breast: Secondary | ICD-10-CM

## 2021-03-31 DIAGNOSIS — J9811 Atelectasis: Secondary | ICD-10-CM | POA: Diagnosis present

## 2021-03-31 DIAGNOSIS — Z20822 Contact with and (suspected) exposure to covid-19: Secondary | ICD-10-CM | POA: Diagnosis present

## 2021-03-31 DIAGNOSIS — I130 Hypertensive heart and chronic kidney disease with heart failure and stage 1 through stage 4 chronic kidney disease, or unspecified chronic kidney disease: Secondary | ICD-10-CM | POA: Diagnosis present

## 2021-03-31 DIAGNOSIS — K4090 Unilateral inguinal hernia, without obstruction or gangrene, not specified as recurrent: Secondary | ICD-10-CM | POA: Diagnosis present

## 2021-03-31 DIAGNOSIS — I480 Paroxysmal atrial fibrillation: Secondary | ICD-10-CM | POA: Diagnosis present

## 2021-03-31 DIAGNOSIS — F17210 Nicotine dependence, cigarettes, uncomplicated: Secondary | ICD-10-CM | POA: Diagnosis present

## 2021-03-31 DIAGNOSIS — R079 Chest pain, unspecified: Secondary | ICD-10-CM

## 2021-03-31 DIAGNOSIS — N179 Acute kidney failure, unspecified: Secondary | ICD-10-CM | POA: Diagnosis not present

## 2021-03-31 DIAGNOSIS — K8010 Calculus of gallbladder with chronic cholecystitis without obstruction: Secondary | ICD-10-CM | POA: Diagnosis not present

## 2021-03-31 DIAGNOSIS — Z820 Family history of epilepsy and other diseases of the nervous system: Secondary | ICD-10-CM

## 2021-03-31 DIAGNOSIS — Z8249 Family history of ischemic heart disease and other diseases of the circulatory system: Secondary | ICD-10-CM

## 2021-03-31 DIAGNOSIS — Z955 Presence of coronary angioplasty implant and graft: Secondary | ICD-10-CM

## 2021-03-31 DIAGNOSIS — Z7901 Long term (current) use of anticoagulants: Secondary | ICD-10-CM

## 2021-03-31 DIAGNOSIS — T508X5A Adverse effect of diagnostic agents, initial encounter: Secondary | ICD-10-CM | POA: Diagnosis not present

## 2021-03-31 LAB — COMPREHENSIVE METABOLIC PANEL
ALT: 10 U/L (ref 0–44)
AST: 20 U/L (ref 15–41)
Albumin: 4 g/dL (ref 3.5–5.0)
Alkaline Phosphatase: 149 U/L — ABNORMAL HIGH (ref 38–126)
Anion gap: 12 (ref 5–15)
BUN: 18 mg/dL (ref 6–20)
CO2: 30 mmol/L (ref 22–32)
Calcium: 9 mg/dL (ref 8.9–10.3)
Chloride: 98 mmol/L (ref 98–111)
Creatinine, Ser: 1.29 mg/dL — ABNORMAL HIGH (ref 0.61–1.24)
GFR, Estimated: >60 mL/min (ref >60)
Glucose, Bld: 138 mg/dL — ABNORMAL HIGH (ref 70–99)
Potassium: 2.9 mmol/L — ABNORMAL LOW (ref 3.5–5.1)
Sodium: 140 mmol/L (ref 135–145)
Total Bilirubin: 1.4 mg/dL — ABNORMAL HIGH (ref 0.3–1.2)
Total Protein: 7.3 g/dL (ref 6.5–8.1)

## 2021-03-31 LAB — CBC
HCT: 45.8 % (ref 39.0–52.0)
Hemoglobin: 14.8 g/dL (ref 13.0–17.0)
MCH: 32.5 pg (ref 26.0–34.0)
MCHC: 32.3 g/dL (ref 30.0–36.0)
MCV: 100.7 fL — ABNORMAL HIGH (ref 80.0–100.0)
Platelets: 164 10*3/uL (ref 150–400)
RBC: 4.55 MIL/uL (ref 4.22–5.81)
RDW: 16.9 % — ABNORMAL HIGH (ref 11.5–15.5)
WBC: 10.1 10*3/uL (ref 4.0–10.5)
nRBC: 0 % (ref 0.0–0.2)

## 2021-03-31 LAB — LIPASE, BLOOD: Lipase: 30 U/L (ref 11–51)

## 2021-03-31 LAB — RESP PANEL BY RT-PCR (FLU A&B, COVID) ARPGX2
Influenza A by PCR: NEGATIVE
Influenza B by PCR: NEGATIVE
SARS Coronavirus 2 by RT PCR: NEGATIVE

## 2021-03-31 LAB — TROPONIN I (HIGH SENSITIVITY)
Troponin I (High Sensitivity): 30 ng/L — ABNORMAL HIGH (ref <18)
Troponin I (High Sensitivity): 30 ng/L — ABNORMAL HIGH (ref <18)

## 2021-03-31 MED ORDER — POTASSIUM CHLORIDE CRYS ER 20 MEQ PO TBCR
10.0000 meq | EXTENDED_RELEASE_TABLET | Freq: Every day | ORAL | Status: DC
  Administered 2021-03-31 – 2021-04-04 (×4): 10 meq via ORAL
  Filled 2021-03-31 (×5): qty 1

## 2021-03-31 MED ORDER — ACETAMINOPHEN 500 MG PO TABS
1000.0000 mg | ORAL_TABLET | Freq: Four times a day (QID) | ORAL | Status: DC
Start: 2021-04-01 — End: 2021-04-03
  Administered 2021-03-31 – 2021-04-03 (×7): 1000 mg via ORAL
  Filled 2021-03-31 (×8): qty 2

## 2021-03-31 MED ORDER — IOHEXOL 350 MG/ML SOLN
100.0000 mL | Freq: Once | INTRAVENOUS | Status: AC | PRN
  Administered 2021-03-31: 100 mL via INTRAVENOUS

## 2021-03-31 MED ORDER — ATORVASTATIN CALCIUM 20 MG PO TABS
80.0000 mg | ORAL_TABLET | Freq: Every day | ORAL | Status: DC
  Administered 2021-04-01 – 2021-04-11 (×10): 80 mg via ORAL
  Filled 2021-03-31 (×11): qty 4

## 2021-03-31 MED ORDER — ONDANSETRON HCL 4 MG/2ML IJ SOLN
4.0000 mg | Freq: Four times a day (QID) | INTRAMUSCULAR | Status: DC | PRN
  Administered 2021-03-31: 4 mg via INTRAVENOUS
  Filled 2021-03-31: qty 2

## 2021-03-31 MED ORDER — ONDANSETRON 4 MG PO TBDP: 4.0000 mg | ORAL_TABLET | Freq: Four times a day (QID) | ORAL | Status: DC | PRN

## 2021-03-31 MED ORDER — PIPERACILLIN-TAZOBACTAM 3.375 G IVPB
3.3750 g | Freq: Three times a day (TID) | INTRAVENOUS | Status: DC
  Administered 2021-03-31 – 2021-04-07 (×20): 3.375 g via INTRAVENOUS
  Filled 2021-03-31 (×20): qty 50

## 2021-03-31 MED ORDER — COLCHICINE 0.6 MG PO TABS
0.6000 mg | ORAL_TABLET | Freq: Every day | ORAL | Status: DC | PRN
  Filled 2021-03-31: qty 1

## 2021-03-31 MED ORDER — POTASSIUM CHLORIDE IN NACL 40-0.9 MEQ/L-% IV SOLN
INTRAVENOUS | Status: DC
  Filled 2021-03-31 (×6): qty 1000

## 2021-03-31 MED ORDER — ONDANSETRON HCL 4 MG/2ML IJ SOLN
4.0000 mg | Freq: Once | INTRAMUSCULAR | Status: AC
  Administered 2021-03-31: 4 mg via INTRAVENOUS
  Filled 2021-03-31: qty 2

## 2021-03-31 MED ORDER — FENTANYL CITRATE PF 50 MCG/ML IJ SOSY
50.0000 ug | PREFILLED_SYRINGE | Freq: Once | INTRAMUSCULAR | Status: AC
  Administered 2021-03-31: 50 ug via INTRAVENOUS
  Filled 2021-03-31: qty 1

## 2021-03-31 MED ORDER — HYDROMORPHONE HCL 1 MG/ML IJ SOLN
0.5000 mg | INTRAMUSCULAR | Status: DC | PRN
  Administered 2021-03-31 – 2021-04-01 (×5): 0.5 mg via INTRAVENOUS
  Filled 2021-03-31 (×2): qty 0.5
  Filled 2021-03-31 (×2): qty 1
  Filled 2021-03-31: qty 0.5

## 2021-03-31 MED ORDER — ALLOPURINOL 100 MG PO TABS
100.0000 mg | ORAL_TABLET | Freq: Every day | ORAL | Status: DC
  Administered 2021-04-01 – 2021-04-11 (×10): 100 mg via ORAL
  Filled 2021-03-31 (×12): qty 1

## 2021-03-31 MED ORDER — CARVEDILOL 6.25 MG PO TABS
3.1250 mg | ORAL_TABLET | Freq: Two times a day (BID) | ORAL | Status: DC
  Administered 2021-04-01 – 2021-04-11 (×15): 3.125 mg via ORAL
  Filled 2021-03-31 (×18): qty 1

## 2021-03-31 NOTE — ED Provider Notes (Signed)
[facility] Emergency Department Provider Note   ____________________________________________   I have reviewed the triage vital signs and the nursing notes.   HISTORY  Chief Complaint Abdominal Pain   History limited by: Not Limited   HPI Cameron [name] is a 60 y.o. male who presents to the emergency department today because of concerns for abdominal pain.  Patient states the pain is located in his right upper quadrant and epigastric region.  States this started around 2 PM today.  Has been fairly constant since then.  Was accompanied by some nausea.  Patient did feel like if he could vomit he would feel better although he only had some dry heaving.  He denies similar pain in the past.  He denies any fevers recently.  Records reviewed. Per medical record review patient has a history of CAD, CHF.  Past Medical History:  Diagnosis Date   CHF (congestive heart failure) (HCC)    Coronary artery disease    a. 12/17 PCI with DES to pLAD with resdiual disease (RX therapy)   Hyperlipidemia    Hypertension    PAD (peripheral artery disease) (HCC)    STEMI (ST elevation myocardial infarction) (HCC) 2017    Patient Active Problem List   Diagnosis Date Noted   Dry gangrene (HCC)    Cellulitis of left foot 08/17/2020   Pressure injury of skin 07/24/2020   Acute gouty arthritis 07/24/2020   Chronic systolic CHF (congestive heart failure) (HCC) 07/24/2020   Atrial fibrillation, chronic (HCC) 07/24/2020   Iron deficiency anemia 07/24/2020   Leukocytosis 07/24/2020   CKD (chronic kidney disease), stage II    Hypotension    Acute on chronic respiratory failure with hypoxia (HCC)    PVD (peripheral vascular disease) (HCC)    Acute renal failure superimposed on stage 3a chronic kidney disease (HCC)    Macrocytic anemia    Atherosclerosis of native arteries of extremity with rest pain (HCC) 06/19/2020   Acute on chronic systolic CHF (congestive heart failure)  (HCC) 02/17/2020   Hypokalemia 02/17/2020   Hypomagnesemia 02/17/2020   Elevated troponin 02/17/2020   New onset atrial fibrillation (HCC) 02/17/2020   Olecranon bursitis of left elbow 02/17/2020   NSVT (nonsustained ventricular tachycardia) (HCC) 02/17/2020   Tobacco abuse 02/17/2020   Apical mural thrombus 02/17/2020   Gout_left elbow 02/17/2020   Bilateral leg edema 10/19/2018   Weight gain 10/19/2018   Chest pain 02/02/2018   Acute pain of right knee 03/13/2017   Essential hypertension 06/20/2016   Hyperlipidemia 06/20/2016   CAD (coronary artery disease), native coronary artery 06/20/2016   STEMI involving oth coronary artery of anterior wall (HCC) 06/17/2016   STEMI (ST elevation myocardial infarction) (HCC) 06/17/2016    Past Surgical History:  Procedure Laterality Date   CARDIAC CATHETERIZATION N/A 06/17/2016   Procedure: Left Heart Cath and Coronary Angiography;  Surgeon: Runell Gess, MD;  Location: [facility] INVASIVE CV LAB;  Service: Cardiovascular;  Laterality: N/A;   CARDIAC CATHETERIZATION N/A 06/17/2016   Procedure: Coronary Stent Intervention;  Surgeon: Runell Gess, MD;  Location: [facility] INVASIVE CV LAB;  Service: Cardiovascular;  Laterality: N/A;   cardiac stents     CERVICAL SPINE SURGERY     LOWER EXTREMITY ANGIOGRAPHY Left 06/25/2020   Procedure: LOWER EXTREMITY ANGIOGRAPHY;  Surgeon: Annice Needy, MD;  Location: [facility] INVASIVE CV LAB;  Service: Cardiovascular;  Laterality: Left;    Prior to Admission medications   Medication Sig Start Date End Date Taking? Authorizing  Provider  allopurinol (ZYLOPRIM) 100 MG tablet Take 1 tablet (100 mg total) by mouth daily. 07/26/20 07/26/21  Gillis Santa, MD  apixaban (ELIQUIS) 5 MG TABS tablet Take 1 tablet (5 mg total) by mouth 2 (two) times daily. 06/29/20 07/29/20  Alford Highland, MD  ascorbic acid (VITAMIN C) 500 MG tablet Take 1 tablet (500 mg total) by mouth daily. 07/27/20   Gillis Santa, MD  atorvastatin (LIPITOR)  80 MG tablet Take 1 tablet (80 mg total) by mouth daily. 02/21/20   Charise Killian, MD  carvedilol (COREG) 3.125 MG tablet Take 1 tablet (3.125 mg total) by mouth 2 (two) times daily with a meal. 06/29/20   Alford Highland, MD  colchicine 0.6 MG tablet Take 1 tablet (0.6 mg total) by mouth daily as needed. 07/26/20 08/25/20  Gillis Santa, MD  collagenase (SANTYL) ointment Apply topically daily. 08/20/20   Enedina Finner, MD  feeding supplement (ENSURE ENLIVE / ENSURE PLUS) LIQD Take 237 mLs by mouth 2 (two) times daily between meals. 08/19/20   Enedina Finner, MD  folic acid (FOLVITE) 1 MG tablet Take 1 tablet (1 mg total) by mouth daily. 07/27/20   Gillis Santa, MD  furosemide (LASIX) 40 MG tablet Take 0.5 tablets (20 mg total) by mouth 2 (two) times daily. 08/19/20 09/18/20  Enedina Finner, MD  lisinopril (ZESTRIL) 2.5 MG tablet Take 1 tablet (2.5 mg total) by mouth daily. 08/19/20   Enedina Finner, MD  oxyCODONE-acetaminophen (PERCOCET/ROXICET) 5-325 MG tablet Take 1 tablet by mouth every 8 (eight) hours as needed for severe pain. 08/19/20   Enedina Finner, MD  potassium chloride (KLOR-CON) 10 MEQ tablet Take 1 tablet (10 mEq total) by mouth daily. 06/29/20 07/29/20  Alford Highland, MD  vitamin B-12 1000 MCG tablet Take 1 tablet (1,000 mcg total) by mouth daily. 06/30/20   Alford Highland, MD  isosorbide mononitrate (IMDUR) 60 MG 24 hr tablet Take 1 tablet (60 mg total) by mouth daily. 02/04/18 05/30/19  [name], MD    Allergies Patient has no known allergies.  Family History  Problem Relation Age of Onset   CAD Paternal Grandfather    Breast cancer Mother    Parkinson's disease Father    Alzheimer's disease Father    Hypertension Father    Diabetes Father     Social History Social History   Tobacco Use   Smoking status: Every Day    Packs/day: 1.00    Types: Cigarettes   Smokeless tobacco: Never  Vaping Use   Vaping Use: Never used  Substance Use Topics   Alcohol use: Not Currently     Comment: weekends   Drug use: Yes    Frequency: 1.0 times per week    Types: Marijuana    Comment: twice monthly    Review of Systems Constitutional: No fever/chills Eyes: No visual changes. ENT: No sore throat. Cardiovascular: Denies chest pain. Respiratory: Denies shortness of breath. Gastrointestinal: Positive for abdominal pain. Positive for nausea.  Genitourinary: Negative for dysuria. Musculoskeletal: Negative for back pain. Skin: Negative for rash. Neurological: Negative for headaches, focal weakness or numbness.  ____________________________________________   PHYSICAL EXAM:  VITAL SIGNS: ED Triage Vitals  Enc Vitals Group     BP 03/31/21 1904 (!) 164/114     Pulse Rate 03/31/21 1904 (!) 115     Resp 03/31/21 1904 20     Temp 03/31/21 1904 98.5 F (36.9 C)     Temp Source 03/31/21 1904 Oral     SpO2 03/31/21 1904  92 %     Weight 03/31/21 1905 185 lb (83.9 kg)     Height 03/31/21 1905 5\' 7"  (1.702 m)     Head Circumference --      Peak Flow --      Pain Score 03/31/21 1905 9     Pain Loc --      Pain Edu? --      Excl. in GC? --      Constitutional: Alert and oriented.  Eyes: Conjunctivae are normal.  ENT      Head: Normocephalic and atraumatic.      Nose: No congestion/rhinnorhea.      Mouth/Throat: Mucous membranes are moist.      Neck: No stridor. Hematological/Lymphatic/Immunilogical: No cervical lymphadenopathy. Cardiovascular: Normal rate, regular rhythm.  No murmurs, rubs, or gallops.  Respiratory: Normal respiratory effort without tachypnea nor retractions. Breath sounds are clear and equal bilaterally. No wheezes/rales/rhonchi. Gastrointestinal: Soft. Tender to palpation in the epigastric and RUQ. Genitourinary: Deferred Musculoskeletal: Normal range of motion in all extremities. No lower extremity edema. Neurologic:  Normal speech and language. No gross focal neurologic deficits are appreciated.  Skin:  Skin is warm, dry and intact. No rash  noted. Psychiatric: Mood and affect are normal. Speech and behavior are normal. Patient exhibits appropriate insight and judgment.  ____________________________________________    LABS (pertinent positives/negatives)  CBC wbc 10.1, hgb 14.8, plt 164 COVID negative Lipase 30 CMP na 140, k 2.9, glu 138, cr 1.29 Trop hs 30 x 2 ____________________________________________   EKG  I, 04/02/21, attending physician, personally viewed and interpreted this EKG  EKG Time: 1900 Rate: 115 Rhythm: atrial flutter with 2:1 av conduction Axis: normal Intervals: qtc 403 QRS: narrow, q waves v1, v2, v3 ST changes: no st elevation Impression: abnormal ekg ____________________________________________    RADIOLOGY  CXR Stable cardiomegaly with vascular congestions and possible bilateral edema  CT abd/pel Gallstone in neck of gallbladder with associated inflammatory changes.  ____________________________________________   PROCEDURES  Procedures  ____________________________________________   INITIAL IMPRESSION / ASSESSMENT AND PLAN / ED COURSE  Pertinent labs & imaging results that were available during my care of the patient were reviewed by me and considered in my medical decision making (see chart for details).   Patient presents to the emergency department today because of concern for abdominal pain. On exam patient is tender in the RUQ and epigastric region. CT is concerning for cholecystitis. Shortly after the patient received pain medication he did have an episode of hypoxia.  He was placed on some nasal cannula.  I discussed with Dr. Phineas Semen with surgery about the cholecystitis. They will plan on admission.   ____________________________________________   FINAL CLINICAL IMPRESSION(S) / ED DIAGNOSES  Final diagnoses:  Cholecystitis     Note: This dictation was prepared with Dragon dictation. Any transcriptional errors that result from this process are  unintentional     Lady Gary, MD 03/31/21 2231

## 2021-03-31 NOTE — Progress Notes (Signed)
Briefly, this is a 60 y/o M with history notable for CAD/PAD who presented to the ED with upper abdominal pain, n/v.  No fever.  ED evaluation demonstrated large gallstone lodged in gallbladder neck, with associated inflammatory changes on CT, consistent with acute calculous cholecystitis.  Troponin slightly elevated, but may be associated with his chronic cardiac disease and CKD.  On Eliquis.  Will admit for IVF, electrolyte replacement, IV abx, and possible surgery. Will need cardiology involvement for risk stratification and recommendations.  Full H&P to follow.

## 2021-03-31 NOTE — ED Notes (Addendum)
Pt OTF with CT - will attach to cardiac monitor & reassess VS on return.

## 2021-03-31 NOTE — ED Triage Notes (Signed)
Pt with upper abd pain bilateral quadrants that radiates into chest per pt. Pt states he "broke out in a sweat" and had an episode of emesis. Pt states began at 1400 today, denies known fever.

## 2021-04-01 ENCOUNTER — Encounter: Payer: Self-pay | Admitting: General Surgery

## 2021-04-01 DIAGNOSIS — K8010 Calculus of gallbladder with chronic cholecystitis without obstruction: Secondary | ICD-10-CM

## 2021-04-01 LAB — COMPREHENSIVE METABOLIC PANEL
ALT: 7 U/L (ref 0–44)
AST: 16 U/L (ref 15–41)
Albumin: 3.6 g/dL (ref 3.5–5.0)
Alkaline Phosphatase: 125 U/L (ref 38–126)
Anion gap: 10 (ref 5–15)
BUN: 16 mg/dL (ref 6–20)
CO2: 30 mmol/L (ref 22–32)
Calcium: 8.6 mg/dL — ABNORMAL LOW (ref 8.9–10.3)
Chloride: 102 mmol/L (ref 98–111)
Creatinine, Ser: 1.15 mg/dL (ref 0.61–1.24)
GFR, Estimated: >60 mL/min (ref >60)
Glucose, Bld: 114 mg/dL — ABNORMAL HIGH (ref 70–99)
Potassium: 3.7 mmol/L (ref 3.5–5.1)
Sodium: 142 mmol/L (ref 135–145)
Total Bilirubin: 1.6 mg/dL — ABNORMAL HIGH (ref 0.3–1.2)
Total Protein: 6.7 g/dL (ref 6.5–8.1)

## 2021-04-01 LAB — CBC
HCT: 48.8 % (ref 39.0–52.0)
Hemoglobin: 15 g/dL (ref 13.0–17.0)
MCH: 31.2 pg (ref 26.0–34.0)
MCHC: 30.7 g/dL (ref 30.0–36.0)
MCV: 101.5 fL — ABNORMAL HIGH (ref 80.0–100.0)
Platelets: 146 10*3/uL — ABNORMAL LOW (ref 150–400)
RBC: 4.81 MIL/uL (ref 4.22–5.81)
RDW: 17.2 % — ABNORMAL HIGH (ref 11.5–15.5)
WBC: 10.5 10*3/uL (ref 4.0–10.5)
nRBC: 0 % (ref 0.0–0.2)

## 2021-04-01 LAB — HIV ANTIBODY (ROUTINE TESTING W REFLEX): HIV Screen 4th Generation wRfx: NONREACTIVE

## 2021-04-01 LAB — MAGNESIUM: Magnesium: 1.6 mg/dL — ABNORMAL LOW (ref 1.7–2.4)

## 2021-04-01 LAB — PHOSPHORUS: Phosphorus: 4.5 mg/dL (ref 2.5–4.6)

## 2021-04-01 MED ORDER — MAGNESIUM SULFATE 2 GM/50ML IV SOLN
2.0000 g | Freq: Once | INTRAVENOUS | Status: AC
  Administered 2021-04-01: 2 g via INTRAVENOUS
  Filled 2021-04-01: qty 50

## 2021-04-01 MED ORDER — AMIODARONE HCL 200 MG PO TABS
200.0000 mg | ORAL_TABLET | Freq: Two times a day (BID) | ORAL | Status: DC
  Administered 2021-04-01 – 2021-04-11 (×15): 200 mg via ORAL
  Filled 2021-04-01 (×15): qty 1

## 2021-04-01 NOTE — ED Notes (Signed)
Pt desat to low 70s when he removes his oxygen to go to the bathroom. Pt improved when placed back on oxygen.

## 2021-04-01 NOTE — ED Notes (Signed)
Pt ambulates to bathroom, able to void sans complications.  

## 2021-04-01 NOTE — ED Notes (Signed)
Report given to day shift RN - all questions answered.

## 2021-04-01 NOTE — Consult Note (Signed)
[facility] Cardiology  CARDIOLOGY CONSULT NOTE  Patient ID: Cameron [name] MRN: [id number] DOB/AGE: Dec 07, 1960 60 y.o.  Admit date: 03/31/2021 Referring Physician Manus Rudd Primary Physician [facility] Primary Cardiologist [name] Reason for Consultation preoperative cardiovascular assessment  HPI: 60 year old gentleman referred for preoperative cardiovascular assessment.  Patient presented to [facility] ED 03/31/2021 with acute onset of upper abdominal pain.  Abdominal CT revealed large gallstone in the neck of the gallbladder with symptoms consistent with acute cholecystitis.  Patient referred for preoperative cardiovascular assessment for possible cholecystectomy.  The patient has known coronary artery disease, ischemic cardiomyopathy, chronic systolic congestive heart failure and atrial fibrillation/atrial flutter.  Patient is status post non-ST elevation myocardial infarction thousand 6 at which time he underwent bifurcation stent LAD and stent AV groove left circumflex.  The patient was admitted to [facility] with anterior STEMI 06/17/2016 at which time cardiac catheterization revealed acute occlusion LAD, occluded proximal left circumflex, and 75% stenosis of mid RCA with LVEF 25 to 35%.  The patient underwent primary PCI with drug-eluting stent mid LAD.  Most recent 2D echocardiogram 02/17/2020 revealed LVEF 20-25%.  The patient has atrial fibrillation/atrial flutter on Eliquis for stroke prevention and amiodarone for rate and rhythm control.  Patient currently denies chest pain, shortness of breath or heart racing.  ECG reveals atrial flutter at a rate of 115 bpm.  Admission labs revealed borderline elevated high-sensitivity troponin of 30, 30.  Review of systems complete and found to be negative unless listed above     Past Medical History:  Diagnosis Date   CHF (congestive heart failure) (HCC)    Coronary artery disease    a. 12/17 PCI with DES to pLAD with resdiual disease (RX therapy)    Hyperlipidemia    Hypertension    PAD (peripheral artery disease) (HCC)    STEMI (ST elevation myocardial infarction) (HCC) 2017    Past Surgical History:  Procedure Laterality Date   CARDIAC CATHETERIZATION N/A 06/17/2016   Procedure: Left Heart Cath and Coronary Angiography;  Surgeon: Runell Gess, MD;  Location: [facility] INVASIVE CV LAB;  Service: Cardiovascular;  Laterality: N/A;   CARDIAC CATHETERIZATION N/A 06/17/2016   Procedure: Coronary Stent Intervention;  Surgeon: Runell Gess, MD;  Location: [facility] INVASIVE CV LAB;  Service: Cardiovascular;  Laterality: N/A;   cardiac stents     CERVICAL SPINE SURGERY     LOWER EXTREMITY ANGIOGRAPHY Left 06/25/2020   Procedure: LOWER EXTREMITY ANGIOGRAPHY;  Surgeon: Annice Needy, MD;  Location: [facility] INVASIVE CV LAB;  Service: Cardiovascular;  Laterality: Left;    Medications Prior to Admission  Medication Sig Dispense Refill Last Dose   acetaminophen (TYLENOL) 500 MG tablet Take 500 mg by mouth daily.   03/31/2021   amiodarone (PACERONE) 200 MG tablet Take 1 tablet by mouth 2 (two) times daily.   03/31/2021   apixaban (ELIQUIS) 5 MG TABS tablet Take 1 tablet by mouth 2 (two) times daily.   03/31/2021   atorvastatin (LIPITOR) 80 MG tablet Take 1 tablet (80 mg total) by mouth daily.   03/31/2021   furosemide (LASIX) 40 MG tablet Take 0.5 tablets (20 mg total) by mouth 2 (two) times daily. 60 tablet 1 03/31/2021   lisinopril (ZESTRIL) 2.5 MG tablet Take 1 tablet (2.5 mg total) by mouth daily. 30 tablet 0 03/31/2021   nitroGLYCERIN (NITROSTAT) 0.4 MG SL tablet Place under the tongue.   prn at prn   allopurinol (ZYLOPRIM) 100 MG tablet Take 1 tablet (100 mg total) by mouth daily. (Patient  not taking: Reported on 04/01/2021) 30 tablet 11 Not Taking   apixaban (ELIQUIS) 5 MG TABS tablet Take 1 tablet (5 mg total) by mouth 2 (two) times daily. 60 tablet 0    ascorbic acid (VITAMIN C) 500 MG tablet Take 1 tablet (500 mg total) by mouth daily. (Patient not  taking: Reported on 04/01/2021) 30 tablet 0 Not Taking   carvedilol (COREG) 3.125 MG tablet Take 1 tablet (3.125 mg total) by mouth 2 (two) times daily with a meal. (Patient not taking: Reported on 04/01/2021) 60 tablet 0 Not Taking   colchicine 0.6 MG tablet Take 1 tablet (0.6 mg total) by mouth daily as needed. 30 tablet 0    collagenase (SANTYL) ointment Apply topically daily. 15 g 0    feeding supplement (ENSURE ENLIVE / ENSURE PLUS) LIQD Take 237 mLs by mouth 2 (two) times daily between meals. 237 mL 12    folic acid (FOLVITE) 1 MG tablet Take 1 tablet (1 mg total) by mouth daily. (Patient not taking: Reported on 04/01/2021) 30 tablet 0 Not Taking   metolazone (ZAROXOLYN) 2.5 MG tablet Take 2.5 mg by mouth 2 (two) times daily. (Patient not taking: No sig reported)   Not Taking   oxyCODONE-acetaminophen (PERCOCET/ROXICET) 5-325 MG tablet Take 1 tablet by mouth every 8 (eight) hours as needed for severe pain. (Patient not taking: No sig reported) 15 tablet 0 Not Taking   potassium chloride (KLOR-CON) 10 MEQ tablet Take 1 tablet (10 mEq total) by mouth daily. 30 tablet 0    vitamin B-12 1000 MCG tablet Take 1 tablet (1,000 mcg total) by mouth daily. (Patient not taking: Reported on 04/01/2021) 30 tablet 0 Not Taking   Social History   Socioeconomic History   Marital status: Divorced    Spouse name: Not on file   Number of children: Not on file   Years of education: Not on file   Highest education level: Not on file  Occupational History   Not on file  Tobacco Use   Smoking status: Every Day    Packs/day: 1.00    Types: Cigarettes   Smokeless tobacco: Never  Vaping Use   Vaping Use: Never used  Substance and Sexual Activity   Alcohol use: Not Currently    Comment: weekends   Drug use: Yes    Frequency: 1.0 times per week    Types: Marijuana    Comment: twice monthly   Sexual activity: Not on file  Other Topics Concern   Not on file  Social History Narrative   Not on file    Social Determinants of Health   Financial Resource Strain: Not on file  Food Insecurity: Not on file  Transportation Needs: Not on file  Physical Activity: Not on file  Stress: Not on file  Social Connections: Not on file  Intimate Partner Violence: Not on file    Family History  Problem Relation Age of Onset   CAD Paternal Grandfather    Breast cancer Mother    Parkinson's disease Father    Alzheimer's disease Father    Hypertension Father    Diabetes Father       Review of systems complete and found to be negative unless listed above      PHYSICAL EXAM  General: Well developed, well nourished, in no acute distress HEENT:  Normocephalic and atramatic Neck:  No JVD.  Lungs: Clear bilaterally to auscultation and percussion. Heart: HRRR . Normal S1 and S2 without gallops or murmurs.  Abdomen:  Bowel sounds are positive, abdomen soft and non-tender  Msk:  Back normal, normal gait. Normal strength and tone for age. Extremities: No clubbing, cyanosis or edema.   Neuro: Alert and oriented X 3. Psych:  Good affect, responds appropriately  Labs:   Lab Results  Component Value Date   WBC 10.5 04/01/2021   HGB 15.0 04/01/2021   HCT 48.8 04/01/2021   MCV 101.5 (H) 04/01/2021   PLT 146 (L) 04/01/2021    Recent Labs  Lab 04/01/21 0641  NA 142  K 3.7  CL 102  CO2 30  BUN 16  CREATININE 1.15  CALCIUM 8.6*  PROT 6.7  BILITOT 1.6*  ALKPHOS 125  ALT 7  AST 16  GLUCOSE 114*   Lab Results  Component Value Date   CKTOTAL 176 07/24/2020   CKMB < 0.5 (L) 11/20/2011   TROPONINI 0.04 (HH) 02/02/2018    Lab Results  Component Value Date   CHOL 108 02/18/2020   CHOL 146 02/03/2018   CHOL 144 06/19/2016   Lab Results  Component Value Date   HDL 39 (L) 02/18/2020   HDL 33 (L) 02/03/2018   HDL 28 (L) 06/19/2016   Lab Results  Component Value Date   LDLCALC 52 02/18/2020   LDLCALC 71 02/03/2018   LDLCALC 82 06/19/2016   Lab Results  Component Value Date    TRIG 87 02/18/2020   TRIG 212 (H) 02/03/2018   TRIG 171 (H) 06/19/2016   Lab Results  Component Value Date   CHOLHDL 2.8 02/18/2020   CHOLHDL 4.4 02/03/2018   CHOLHDL 5.1 06/19/2016   No results found for: LDLDIRECT    Radiology: CT ABDOMEN PELVIS W CONTRAST  Result Date: 03/31/2021 CLINICAL DATA:  Acute upper abdominal pain. EXAM: CT ABDOMEN AND PELVIS WITH CONTRAST TECHNIQUE: Multidetector CT imaging of the abdomen and pelvis was performed using the standard protocol following bolus administration of intravenous contrast. CONTRAST:  OMNIPAQUE IOHEXOL 350 MG/ML SOLN COMPARISON:  March 09, 2017. FINDINGS: Lower chest: Small right pleural effusion is noted with adjacent subsegmental atelectasis. Hepatobiliary: Large solitary gallstone is noted in the neck of the gallbladder with gallbladder distention and mild surrounding inflammatory changes concerning for cholecystitis. No biliary dilatation is noted. Hepatic steatosis is noted. Pancreas: Unremarkable. No pancreatic ductal dilatation or surrounding inflammatory changes. Spleen: Normal in size without focal abnormality. Adrenals/Urinary Tract: Adrenal glands appear normal. Right renal atrophy is noted. No hydronephrosis or renal obstruction is noted. Two rounded enhancing abnormalities are seen posteriorly in the urinary bladder. Stomach/Bowel: Stomach is within normal limits. Appendix appears normal. No evidence of bowel wall thickening, distention, or inflammatory changes. Vascular/Lymphatic: Aortic atherosclerosis. No enlarged abdominal or pelvic lymph nodes. Reproductive: Prostate is unremarkable. Other: Small right fat containing right inguinal hernia is noted. No ascites is noted. Musculoskeletal: No acute or significant osseous findings. IMPRESSION: Large solitary gallstone is noted in neck of gallbladder which results in mild gallbladder distention with surrounding inflammatory changes, concerning for acute cholecystitis. Hepatic  steatosis. Two rounded small hyperdense or enhancing abnormalities are noted posteriorly in the urinary bladder. It is uncertain if this represents contrast or potentially bladder masses. Bladder ultrasound may be performed for further evaluation or potentially cystoscopy. Small right fat containing inguinal hernia. Small right pleural effusion with adjacent subsegmental atelectasis. Aortic Atherosclerosis (ICD10-I70.0). Electronically Signed   By: Lupita Raider M.D.   On: 03/31/2021 20:50   DG Chest Port 1 View  Result Date: 03/31/2021 CLINICAL DATA:  Chest pain. EXAM: PORTABLE  CHEST 1 VIEW COMPARISON:  July 24, 2020. FINDINGS: Stable cardiomegaly is noted with mild central pulmonary vascular congestion. No consolidative process is noted. Mild bibasilar edema may be present. Bony thorax is unremarkable. IMPRESSION: Stable cardiomegaly with mild central pulmonary vascular congestion and possible bibasilar edema. Electronically Signed   By: Lupita Raider M.D.   On: 03/31/2021 19:31    EKG: Atrial flutter 115 bpm  ASSESSMENT AND PLAN:   1.  Preoperative cardiovascular assessment, prior to cholecystectomy.  Patient at low to moderate risk for cardiovascular complication during surgery in light of underlying complex coronary artery disease, ischemic cardiomyopathy, chronic systolic congestive heart failure, and atrial flutter.  Patient currently denies chest pain or shortness of breath, appears euvolemic, in the absence of chest pain, with borderline elevated high-sensitivity troponin without delta, appears medically optimized. 2.  Coronary artery disease, status post non-STEMI and complex stenting 2006, status post anterior STEMI/06/2016, status post primary PCI with DES LAD, currently without chest pain 3.  Ischemic cardiomyopathy with LVEF 20 to 25% 4.  Chronic systolic congestive heart failure, currently appears euvolemic 5.  Atrial fibrillation/atrial flutter, on Eliquis for stroke prevention  and amiodarone for rate and rhythm control  Recommendations  1.  Agree with overall current therapy 2.  Continue carvedilol, uptitrate as needed 3.  Resume amiodarone 200 mg twice daily 4.  Defer further cardiac diagnostics at this time 5.  Lasix as needed 6.  Proceed with surgery if deemed necessary 7.  Continue carvedilol pre-, peri and postoperatively 8.  Resume Eliquis as soon as possible after surgery  Signed: Marcina Millard MD,PhD, [facility] 04/01/2021, 1:53 PM

## 2021-04-01 NOTE — H&P (Addendum)
[facility] SURGICAL HISTORY & PHYSICAL (cpt [phone])  HISTORY OF PRESENT ILLNESS (HPI):  60 y.o. male presented to [facility] ED yesterday for abdominal pain. Patient reports the acute onset of upper abdominal pain around 2 PM on the day of presentation. He states this is worse in his RUQ and epigastrium. This has been constant since the onset and he did not get any relief from this. Only associated complaint is nausea. No fever, chills, cough, emesis, or bowel changes. No history of similar in the past. No previous intra-abdominal surgeries. He does have a significant history of CAD, STEMI s/p PCI in 2017, CHF with EF of 20-25% in August of 2021. He is on Eliquis which he last took on Sunday at 0800. Work up in the ED revealed a normal WB at 10.1K, renal function appears baseline with sCr - 1.29 (now 1.15), hypokalemia to 2.9 (now resolved ay 3.7), slight hyperbilirubinemia to 1.4 (1.6), and high sensitivity troponin of 30. His did have CT Abdomen/Pelvis which did show a large gallstone in the neck of the gallbladder with some surrounding stranding concerning for cholecystitis.  General surgery is consulted by emergency medicine physician Dr Olga Coaster, MD for evaluation and management or acute cholecystitis.    PAST MEDICAL HISTORY (PMH):  Past Medical History:  Diagnosis Date   CHF (congestive heart failure) (HCC)    Coronary artery disease    a. 12/17 PCI with DES to pLAD with resdiual disease (RX therapy)   Hyperlipidemia    Hypertension    PAD (peripheral artery disease) (HCC)    STEMI (ST elevation myocardial infarction) (HCC) 2017    Reviewed. Otherwise negative.   PAST SURGICAL HISTORY (PSH):  Past Surgical History:  Procedure Laterality Date   CARDIAC CATHETERIZATION N/A 06/17/2016   Procedure: Left Heart Cath and Coronary Angiography;  Surgeon: Runell Gess, MD;  Location: [facility] INVASIVE CV LAB;  Service: Cardiovascular;  Laterality: N/A;   CARDIAC  CATHETERIZATION N/A 06/17/2016   Procedure: Coronary Stent Intervention;  Surgeon: Runell Gess, MD;  Location: [facility] INVASIVE CV LAB;  Service: Cardiovascular;  Laterality: N/A;   cardiac stents     CERVICAL SPINE SURGERY     LOWER EXTREMITY ANGIOGRAPHY Left 06/25/2020   Procedure: LOWER EXTREMITY ANGIOGRAPHY;  Surgeon: Annice Needy, MD;  Location: [facility] INVASIVE CV LAB;  Service: Cardiovascular;  Laterality: Left;    Reviewed. Otherwise negative.   MEDICATIONS:  Prior to Admission medications   Medication Sig Start Date End Date Taking? Authorizing Provider  acetaminophen (TYLENOL) 500 MG tablet Take 500 mg by mouth daily.   Yes [provider]  amiodarone (PACERONE) 200 MG tablet Take 1 tablet by mouth 2 (two) times daily. 03/02/20  Yes [provider]  apixaban (ELIQUIS) 5 MG TABS tablet Take 1 tablet by mouth 2 (two) times daily. 01/30/21  Yes [provider]  atorvastatin (LIPITOR) 80 MG tablet Take 1 tablet (80 mg total) by mouth daily. 02/21/20  Yes Charise Killian, MD  furosemide (LASIX) 40 MG tablet Take 0.5 tablets (20 mg total) by mouth 2 (two) times daily. 08/19/20 04/01/21 Yes Enedina Finner, MD  lisinopril (ZESTRIL) 2.5 MG tablet Take 1 tablet (2.5 mg total) by mouth daily. 08/19/20  Yes Enedina Finner, MD  nitroGLYCERIN (NITROSTAT) 0.4 MG SL tablet Place under the tongue. 06/20/16  Yes [provider]  allopurinol (ZYLOPRIM) 100 MG tablet Take 1 tablet (100 mg total) by mouth daily. Patient not taking: Reported on 04/01/2021 07/26/20 07/26/21  Gillis Santa, MD  apixaban (ELIQUIS) 5 MG TABS tablet Take 1 tablet (5 mg total) by mouth 2 (two) times daily. 06/29/20 07/29/20  Alford Highland, MD  ascorbic acid (VITAMIN C) 500 MG tablet Take 1 tablet (500 mg total) by mouth daily. Patient not taking: Reported on 04/01/2021 07/27/20   Gillis Santa, MD  carvedilol (COREG) 3.125 MG tablet Take 1 tablet (3.125 mg total) by mouth 2 (two) times daily with a  meal. Patient not taking: Reported on 04/01/2021 06/29/20   Alford Highland, MD  colchicine 0.6 MG tablet Take 1 tablet (0.6 mg total) by mouth daily as needed. 07/26/20 08/25/20  Gillis Santa, MD  collagenase (SANTYL) ointment Apply topically daily. 08/20/20   Enedina Finner, MD  feeding supplement (ENSURE ENLIVE / ENSURE PLUS) LIQD Take 237 mLs by mouth 2 (two) times daily between meals. 08/19/20   Enedina Finner, MD  folic acid (FOLVITE) 1 MG tablet Take 1 tablet (1 mg total) by mouth daily. Patient not taking: Reported on 04/01/2021 07/27/20   Gillis Santa, MD  metolazone (ZAROXOLYN) 2.5 MG tablet Take 2.5 mg by mouth 2 (two) times daily. Patient not taking: No sig reported 08/01/20 08/01/21  [provider]  oxyCODONE-acetaminophen (PERCOCET/ROXICET) 5-325 MG tablet Take 1 tablet by mouth every 8 (eight) hours as needed for severe pain. Patient not taking: No sig reported 08/19/20   Enedina Finner, MD  potassium chloride (KLOR-CON) 10 MEQ tablet Take 1 tablet (10 mEq total) by mouth daily. 06/29/20 07/29/20  Alford Highland, MD  vitamin B-12 1000 MCG tablet Take 1 tablet (1,000 mcg total) by mouth daily. Patient not taking: Reported on 04/01/2021 06/30/20   Alford Highland, MD  isosorbide mononitrate (IMDUR) 60 MG 24 hr tablet Take 1 tablet (60 mg total) by mouth daily. 02/04/18 05/30/19  [name], MD     ALLERGIES:  No Known Allergies   SOCIAL HISTORY:  Social History   Socioeconomic History   Marital status: Divorced    Spouse name: Not on file   Number of children: Not on file   Years of education: Not on file   Highest education level: Not on file  Occupational History   Not on file  Tobacco Use   Smoking status: Every Day    Packs/day: 1.00    Types: Cigarettes   Smokeless tobacco: Never  Vaping Use   Vaping Use: Never used  Substance and Sexual Activity   Alcohol use: Not Currently    Comment: weekends   Drug use: Yes    Frequency: 1.0 times per week    Types: Marijuana     Comment: twice monthly   Sexual activity: Not on file  Other Topics Concern   Not on file  Social History Narrative   Not on file   Social Determinants of Health   Financial Resource Strain: Not on file  Food Insecurity: Not on file  Transportation Needs: Not on file  Physical Activity: Not on file  Stress: Not on file  Social Connections: Not on file  Intimate Partner Violence: Not on file     FAMILY HISTORY:  Family History  Problem Relation Age of Onset   CAD Paternal Grandfather    Breast cancer Mother    Parkinson's disease Father    Alzheimer's disease Father    Hypertension Father    Diabetes Father     Otherwise negative.   REVIEW OF SYSTEMS:  Review of Systems  Constitutional:  Negative for chills and fever.  HENT:  Negative for congestion and sore throat.   Respiratory:  Positive for shortness of breath. Negative for cough.   Cardiovascular:  Negative for chest pain and palpitations.  Gastrointestinal:  Positive for abdominal pain and nausea. Negative for constipation, diarrhea and vomiting.  Genitourinary:  Negative for dysuria and urgency.  All other systems reviewed and are negative.  VITAL SIGNS:  Temp:  [98.5 F (36.9 C)-98.7 F (37.1 C)] 98.6 F (37 C) (09/26 0914) Pulse Rate:  [78-115] 78 (09/26 0914) Resp:  [16-30] 18 (09/26 0914) BP: (107-164)/(72-114) 113/83 (09/26 0914) SpO2:  [80 %-98 %] 97 % (09/26 0914) Weight:  [83.9 kg] 83.9 kg (09/25 1905)     Height: 5\' 7"  (170.2 cm) Weight: 83.9 kg BMI (Calculated): 28.97   PHYSICAL EXAM:  Physical Exam Vitals and nursing note reviewed. Exam conducted with a chaperone present.  Constitutional:      General: He is not in acute distress.    Appearance: He is well-developed. He is obese. He is not ill-appearing.     Interventions: Nasal cannula in place.  Eyes:     General: No scleral icterus.    Extraocular Movements: Extraocular movements intact.  Cardiovascular:     Rate and Rhythm: Normal  rate and regular rhythm.  Pulmonary:     Effort: Pulmonary effort is normal. No respiratory distress.  Abdominal:     General: Abdomen is protuberant. There is no distension.     Palpations: Abdomen is soft.     Tenderness: There is abdominal tenderness in the right upper quadrant and epigastric area. There is no guarding or rebound. Positive signs include Murphy's sign.     Comments: Abdomen is protuberant consistent with his degree of obesity, RUQ/Epigastric tenderness with positive Murphy's Sign, no rebound/guarding   Genitourinary:    Comments: Deferred Skin:    General: Skin is warm and dry.     Coloration: Skin is not jaundiced or pale.  Neurological:     General: No focal deficit present.     Mental Status: He is alert and oriented to person, place, and time.  Psychiatric:        Mood and Affect: Mood normal.        Behavior: Behavior normal.    INTAKE/OUTPUT:  This shift: No intake/output data recorded.  Last 2 shifts: @IOLAST2SHIFTS @  Labs:  CBC Latest Ref Rng & Units 04/01/2021 03/31/2021 08/18/2020  WBC 4.0 - 10.5 K/uL 10.5 10.1 6.0  Hemoglobin 13.0 - 17.0 g/dL 04/02/2021 10/16/2020 11.1(L)  Hematocrit 39.0 - 52.0 % 48.8 45.8 34.2(L)  Platelets 150 - 400 K/uL 146(L) 164 210   CMP Latest Ref Rng & Units 04/01/2021 03/31/2021 08/19/2020  Glucose 70 - 99 mg/dL 04/02/2021) 08/21/2020) 324(M)  BUN 6 - 20 mg/dL 16 18 010(U)  Creatinine 0.61 - 1.24 mg/dL 725(D 66(Y) 4.03)  Sodium 135 - 145 mmol/L 142 140 139  Potassium 3.5 - 5.1 mmol/L 3.7 2.9(L) 3.9  Chloride 98 - 111 mmol/L 102 98 104  CO2 22 - 32 mmol/L 30 30 26   Calcium 8.9 - 10.3 mg/dL 4.74(Q) 9.0 5.95(G)  Total Protein 6.5 - 8.1 g/dL 6.7 7.3 -  Total Bilirubin 0.3 - 1.2 mg/dL ) 3.8(V) -  Alkaline Phos 38 - 126 U/L 125 149(H) -  AST 15 - 41 U/L 16 20 -  ALT 0 - 44 U/L 7 10 -     Imaging studies:   CT Abdomen/Pelvis (03/31/2021) personally reviewed which does show large gallstone in the neck of his  gallbladder with surrounding  changes concerning for cholecystitis, and radiologist report reviewed below:  IMPRESSION: Large solitary gallstone is noted in neck of gallbladder which results in mild gallbladder distention with surrounding inflammatory changes, concerning for acute cholecystitis.   Hepatic steatosis.   Two rounded small hyperdense or enhancing abnormalities are noted posteriorly in the urinary bladder. It is uncertain if this represents contrast or potentially bladder masses. Bladder ultrasound may be performed for further evaluation or potentially cystoscopy.   Small right fat containing inguinal hernia.   Small right pleural effusion with adjacent subsegmental atelectasis.   Aortic Atherosclerosis (ICD10-I70.0).   Assessment/Plan: (ICD-10's: K81.0) 60 y.o. male with upper abdominal pain found to have large gallstone in gallbladder neck with [pericholecystic stranding concerning for acute cholecystitis, complicated by pertinent comorbidities including significant CAD, CHF with Ef of 20-25%.    - Admit to general surgery  - Will plan on cardiology consultation to risk stratify for potential surgical interventions. His cardiac history is concerning. He may need temporization with percutaneous cholecystostomy tube for outpatient optimization. Given the size and location of the stone, he certainly will need cholecystectomy at some point.   - NPO for now; gentle IVF resuscitation - IV Abx (Zosyn)   - Monitor abdominal examination  - Pain control prn; antiemetics prn  - Hold Eliquis for potential interventions; last took on Sunday at 0800  All of the above findings and recommendations were discussed with the patient, and all of his questions were answered to his expressed satisfaction.  -- Lynden Oxford, PA-C [facility] 04/01/2021, 12:51 PM [phone] M-F: 7am - 4pm  I saw and evaluated the patient.  I agree with the above documentation, exam, and plan, which I have edited  where appropriate. Duanne Guess  1:53 PM

## 2021-04-02 DIAGNOSIS — K8010 Calculus of gallbladder with chronic cholecystitis without obstruction: Secondary | ICD-10-CM | POA: Diagnosis not present

## 2021-04-02 LAB — COMPREHENSIVE METABOLIC PANEL
ALT: 8 U/L (ref 0–44)
AST: 15 U/L (ref 15–41)
Albumin: 3.1 g/dL — ABNORMAL LOW (ref 3.5–5.0)
Alkaline Phosphatase: 116 U/L (ref 38–126)
Anion gap: 6 (ref 5–15)
BUN: 23 mg/dL — ABNORMAL HIGH (ref 6–20)
CO2: 27 mmol/L (ref 22–32)
Calcium: 8 mg/dL — ABNORMAL LOW (ref 8.9–10.3)
Chloride: 105 mmol/L (ref 98–111)
Creatinine, Ser: 1.5 mg/dL — ABNORMAL HIGH (ref 0.61–1.24)
GFR, Estimated: 53 mL/min — ABNORMAL LOW (ref >60)
Glucose, Bld: 82 mg/dL (ref 70–99)
Potassium: 4.6 mmol/L (ref 3.5–5.1)
Sodium: 138 mmol/L (ref 135–145)
Total Bilirubin: 2.1 mg/dL — ABNORMAL HIGH (ref 0.3–1.2)
Total Protein: 6 g/dL — ABNORMAL LOW (ref 6.5–8.1)

## 2021-04-02 LAB — CBC
HCT: 41.8 % (ref 39.0–52.0)
Hemoglobin: 12.8 g/dL — ABNORMAL LOW (ref 13.0–17.0)
MCH: 31.7 pg (ref 26.0–34.0)
MCHC: 30.6 g/dL (ref 30.0–36.0)
MCV: 103.5 fL — ABNORMAL HIGH (ref 80.0–100.0)
Platelets: 128 10*3/uL — ABNORMAL LOW (ref 150–400)
RBC: 4.04 MIL/uL — ABNORMAL LOW (ref 4.22–5.81)
RDW: 17.1 % — ABNORMAL HIGH (ref 11.5–15.5)
WBC: 12.9 10*3/uL — ABNORMAL HIGH (ref 4.0–10.5)
nRBC: 0 % (ref 0.0–0.2)

## 2021-04-02 LAB — MAGNESIUM: Magnesium: 1.9 mg/dL (ref 1.7–2.4)

## 2021-04-02 MED ORDER — SODIUM CHLORIDE 0.9 % IV BOLUS
500.0000 mL | Freq: Once | INTRAVENOUS | Status: AC
  Administered 2021-04-02: 500 mL via INTRAVENOUS

## 2021-04-02 MED ORDER — OXYCODONE HCL 5 MG PO TABS
5.0000 mg | ORAL_TABLET | ORAL | Status: DC | PRN
  Administered 2021-04-03: 5 mg via ORAL
  Filled 2021-04-02: qty 1

## 2021-04-02 MED ORDER — INDOCYANINE GREEN 25 MG IV SOLR
7.5000 mg | Freq: Once | INTRAVENOUS | Status: AC
  Administered 2021-04-03: 7.5 mg via INTRAVENOUS
  Filled 2021-04-02: qty 3

## 2021-04-02 NOTE — Progress Notes (Signed)
[facility]  SUBJECTIVE: Patient laying in bed, denies chest pain or shortness of breath   Vitals:   04/02/21 0438 04/02/21 0529 04/02/21 0639 04/02/21 0700  BP: (!) 84/60 (!) 87/75 91/70 104/86  Pulse: 69 (!) 58 78 79  Resp: 18 18 18 18   Temp: 98.2 F (36.8 C) 98 F (36.7 C) 98 F (36.7 C) 98.6 F (37 C)  TempSrc:    Oral  SpO2: 98% 97% 97% 99%  Weight:      Height:         Intake/Output Summary (Last 24 hours) at 04/02/2021 0848 Last data filed at 04/02/2021 0600 Gross per 24 hour  Intake 4263.48 ml  Output --  Net 4263.48 ml      PHYSICAL EXAM  General: Well developed, well nourished, in no acute distress HEENT:  Normocephalic and atramatic Neck:  No JVD.  Lungs: Clear bilaterally to auscultation and percussion. Heart: HRRR . Normal S1 and S2 without gallops or murmurs.  Abdomen: Bowel sounds are positive, abdomen soft and non-tender  Msk:  Back normal, normal gait. Normal strength and tone for age. Extremities: No clubbing, cyanosis or edema.   Neuro: Alert and oriented X 3. Psych:  Good affect, responds appropriately   LABS: Basic Metabolic Panel: Recent Labs    04/01/21 0641 04/02/21 0419  NA 142 138  K 3.7 4.6  CL 102 105  CO2 30 27  GLUCOSE 114* 82  BUN 16 23*  CREATININE 1.15 1.50*  CALCIUM 8.6* 8.0*  MG 1.6* 1.9  PHOS 4.5  --    Liver Function Tests: Recent Labs    04/01/21 0641 04/02/21 0419  AST 16 15  ALT 7 8  ALKPHOS 125 116  BILITOT 1.6* 2.1*  PROT 6.7 6.0*  ALBUMIN 3.6 3.1*   Recent Labs    03/31/21 1904  LIPASE 30   CBC: Recent Labs    04/01/21 0641 04/02/21 0419  WBC 10.5 12.9*  HGB 15.0 12.8*  HCT 48.8 41.8  MCV 101.5* 103.5*  PLT 146* 128*   Cardiac Enzymes: No results for input(s): CKTOTAL, CKMB, CKMBINDEX, TROPONINI in the last 72 hours. BNP: Invalid input(s): POCBNP D-Dimer: No results for input(s): DDIMER in the last 72 hours. Hemoglobin A1C: No results for input(s): HGBA1C in the last 72  hours. Fasting Lipid Panel: No results for input(s): CHOL, HDL, LDLCALC, TRIG, CHOLHDL, LDLDIRECT in the last 72 hours. Thyroid Function Tests: No results for input(s): TSH, T4TOTAL, T3FREE, THYROIDAB in the last 72 hours.  Invalid input(s): FREET3 Anemia Panel: No results for input(s): VITAMINB12, FOLATE, FERRITIN, TIBC, IRON, RETICCTPCT in the last 72 hours.  CT ABDOMEN PELVIS W CONTRAST  Result Date: 03/31/2021 CLINICAL DATA:  Acute upper abdominal pain. EXAM: CT ABDOMEN AND PELVIS WITH CONTRAST TECHNIQUE: Multidetector CT imaging of the abdomen and pelvis was performed using the standard protocol following bolus administration of intravenous contrast. CONTRAST:  04/02/2021 OMNIPAQUE IOHEXOL 350 MG/ML SOLN COMPARISON:  March 09, 2017. FINDINGS: Lower chest: Small right pleural effusion is noted with adjacent subsegmental atelectasis. Hepatobiliary: Large solitary gallstone is noted in the neck of the gallbladder with gallbladder distention and mild surrounding inflammatory changes concerning for cholecystitis. No biliary dilatation is noted. Hepatic steatosis is noted. Pancreas: Unremarkable. No pancreatic ductal dilatation or surrounding inflammatory changes. Spleen: Normal in size without focal abnormality. Adrenals/Urinary Tract: Adrenal glands appear normal. Right renal atrophy is noted. No hydronephrosis or renal obstruction is noted. Two rounded enhancing abnormalities are seen posteriorly in the urinary bladder.  Stomach/Bowel: Stomach is within normal limits. Appendix appears normal. No evidence of bowel wall thickening, distention, or inflammatory changes. Vascular/Lymphatic: Aortic atherosclerosis. No enlarged abdominal or pelvic lymph nodes. Reproductive: Prostate is unremarkable. Other: Small right fat containing right inguinal hernia is noted. No ascites is noted. Musculoskeletal: No acute or significant osseous findings. IMPRESSION: Large solitary gallstone is noted in neck of gallbladder  which results in mild gallbladder distention with surrounding inflammatory changes, concerning for acute cholecystitis. Hepatic steatosis. Two rounded small hyperdense or enhancing abnormalities are noted posteriorly in the urinary bladder. It is uncertain if this represents contrast or potentially bladder masses. Bladder ultrasound may be performed for further evaluation or potentially cystoscopy. Small right fat containing inguinal hernia. Small right pleural effusion with adjacent subsegmental atelectasis. Aortic Atherosclerosis (ICD10-I70.0). Electronically Signed   By: Lupita Raider M.D.   On: 03/31/2021 20:50   DG Chest Port 1 View  Result Date: 03/31/2021 CLINICAL DATA:  Chest pain. EXAM: PORTABLE CHEST 1 VIEW COMPARISON:  July 24, 2020. FINDINGS: Stable cardiomegaly is noted with mild central pulmonary vascular congestion. No consolidative process is noted. Mild bibasilar edema may be present. Bony thorax is unremarkable. IMPRESSION: Stable cardiomegaly with mild central pulmonary vascular congestion and possible bibasilar edema. Electronically Signed   By: Lupita Raider M.D.   On: 03/31/2021 19:31     Echo   TELEMETRY: :  ASSESSMENT AND PLAN:  Active Problems:   Acute calculous cholecystitis    1.  Preoperative cardiovascular assessment, prior to laparoscopic cholecystectomy.  Patient at low to moderate risk for cardiovascular complication during surgery in light of underlying complex coronary artery disease, ischemic cardiomyopathy, chronic systolic congestive heart failure, and atrial flutter.  Patient currently denies chest pain or shortness of breath, appears euvolemic, in the absence of chest pain, with borderline elevated high-sensitivity troponin without delta, appears medically optimized. 2.  Coronary artery disease, status post non-STEMI and complex stenting 2006, status post anterior STEMI/06/2016, status post primary PCI with DES LAD, currently without chest pain 3.   Ischemic cardiomyopathy with LVEF 20 to 25% 4.  Chronic systolic congestive heart failure, currently appears euvolemic 5.  Paroxysmal atrial fibrillation/atrial flutter, on Eliquis for stroke prevention and amiodarone for rate and rhythm control   Recommendations   1.  Agree with overall current therapy 2.  Continue carvedilol, uptitrate as needed 3.  Continue amiodarone 200 mg twice daily 4.  Defer further cardiac diagnostics at this time 5.  Lasix as needed 6.  Proceed with robot-assisted lap cholecystectomy 7.  Continue carvedilol pre-, peri and postoperatively 8.  Resume Eliquis as soon as possible after surgery   Marcina Millard, MD, PhD, [facility] 04/02/2021 8:48 AM

## 2021-04-02 NOTE — Progress Notes (Signed)
Bp-87/75 (map-81), HR-59 - after 2nd bolus ( ). No complaints of lightheadedness or dizziness. No complaints of pain. Kept on oxygen at 3lpm/[place]. Oncall provider informed; awaiting for orders.

## 2021-04-02 NOTE — Progress Notes (Signed)
   04/02/21 0333  Assess: MEWS Score  Temp 98.2 F (36.8 C)  BP (!) 80/62  Pulse Rate 64  Resp 20  Level of Consciousness Alert  SpO2 97 %  O2 Device Nasal Cannula  O2 Flow Rate (L/min) 3 L/min  Assess: MEWS Score  MEWS Temp 0  MEWS Systolic 2  MEWS Pulse 0  MEWS RR 0  MEWS LOC 0  MEWS Score 2  MEWS Score Color Yellow  Assess: if the MEWS score is Yellow or Red  Were vital signs taken at a resting state? Yes  Focused Assessment No change from prior assessment  Does the patient meet 2 or more of the SIRS criteria? No  MEWS guidelines implemented *See Row Information* Yes  Treat  MEWS Interventions Other (Comment) (Dr Maia Plan informed, will give IV bolus)  Pain Scale 0-10  Pain Score 0  Take Vital Signs  Increase Vital Sign Frequency  Yellow: Q 2hr X 2 then Q 4hr X 2, if remains yellow, continue Q 4hrs  Escalate  MEWS: Escalate Yellow: discuss with charge nurse/RN and consider discussing with provider and RRT  Notify: Charge Nurse/RN  Name of Charge Nurse/RN Notified Lennon Alstrom, RN  Date Charge Nurse/RN Notified 04/02/21  Time Charge Nurse/RN Notified 0335  Notify: Provider  Provider Name/Title Dr Maia Plan  Date Provider Notified 04/02/21  Time Provider Notified [phone]  Notification Type Page  Notification Reason Other (Comment) (Bp dropped again)  Provider response See new orders  Date of Provider Response 04/02/21  Time of Provider Response 0340  Document  Patient Outcome Other (Comment) (Bp still low after bolus, 2nd bag of bolus running at the moment as ordered)  Assess: SIRS CRITERIA  SIRS Temperature  0  SIRS Pulse 0  SIRS Respirations  0  SIRS WBC 0  SIRS Score Sum  0   Pt bp 80/62, HR-64, asymptomatic. Turned yellow mews. On oxygen at 3lpm/[place] with sats at 97%. Dr Maia Plan informed and ordered to give 0.9 nacl 500 ml bolus. Bp improved slightly after initial bolus, another bolus ordered, currently ongoing. Will continue to monitor,

## 2021-04-02 NOTE — Progress Notes (Signed)
[facility] SURGICAL PROGRESS NOTE (cpt 860 [id number])  Hospital Day(s): 2.   Interval History: Patient seen and examined. Overnight, he did have an episode of hypotension, improved with IVF bolus. Patient reports he is feeling a little better this morning and abdominal pain improving. He denies fever, chills, nausea, emesis. He did ask to be changed from the dilaudid as this made him very drowsy and foggy. He does have a slight leukocytosis this morning to 12.9K. Slight increase in renal function; sCr - 1.50; UO - unmeasured. Hypomagnesemia resolved now; o/w no significant electrolyte derangements. He continues on Zosyn. Tolerating CLD. Plan for laparoscopic cholecystectomy on Wednesday with Dr Lady Gary.   Review of Systems:  Constitutional: denies fever, chills  HEENT: denies cough or congestion  Respiratory: denies any shortness of breath  Cardiovascular: denies chest pain or palpitations  Gastrointestinal: + abdominal pain (improved), denied N/V, or diarrhea/and bowel function as per interval history Genitourinary: denies burning with urination or urinary frequency  Vital signs in last 24 hours: [min-max] current  Temp:  [97.8 F (36.6 C)-98.7 F (37.1 C)] 98 F (36.7 C) (09/27 0639) Pulse Rate:  [58-106] 78 (09/27 0639) Resp:  [18-20] 18 (09/27 0639) BP: (75-126)/(60-89) 91/70 (09/27 0639) SpO2:  [92 %-100 %] 97 % (09/27 0639)     Height: 5\' 7"  (170.2 cm) Weight: 83.9 kg BMI (Calculated): 28.97   Intake/Output last 2 shifts:  09/26 0701 - 09/27 0700 In: 4263.5 [P.O.:600; I.V.:2514.7; IV Piggyback:1148.8] Out: -    Physical Exam:  Constitutional: alert, cooperative and no distress  HENT: normocephalic without obvious abnormality  Eyes: PERRL, EOM's grossly intact and symmetric  Respiratory: breathing non-labored at rest; on [place] Cardiovascular: regular rate and sinus rhythm  Gastrointestinal: soft, RUQ tenderness improved, and non-distended, no  rebound/guarding Musculoskeletal no edema or wounds, motor and sensation grossly intact, NT    Labs:  CBC Latest Ref Rng & Units 04/02/2021 04/01/2021 03/31/2021  WBC 4.0 - 10.5 K/uL 12.9(H) 10.5 10.1  Hemoglobin 13.0 - 17.0 g/dL 12.8(L) 15.0 14.8  Hematocrit 39.0 - 52.0 % 41.8 48.8 45.8  Platelets 150 - 400 K/uL 128(L) 146(L) 164   CMP Latest Ref Rng & Units 04/02/2021 04/01/2021 03/31/2021  Glucose 70 - 99 mg/dL 82 04/02/2021) 086(P)  BUN 6 - 20 mg/dL 619(J) 16 18  Creatinine 0.61 - 1.24 mg/dL 09(T) 2.67(T 2.45)  Sodium 135 - 145 mmol/L 138 142 140  Potassium 3.5 - 5.1 mmol/L 4.6 3.7 2.9(L)  Chloride 98 - 111 mmol/L 105 102 98  CO2 22 - 32 mmol/L 27 30 30   Calcium 8.9 - 10.3 mg/dL 8.0(L) 8.6(L) 9.0  Total Protein 6.5 - 8.1 g/dL 6.0(L) 6.7 7.3  Total Bilirubin 0.3 - 1.2 mg/dL 2.1(H) 1.6(H) 1.4(H)  Alkaline Phos 38 - 126 U/L 116 125 149(H)  AST 15 - 41 U/L 15 16 20   ALT 0 - 44 U/L 8 7 10     Imaging studies: No new pertinent imaging studies   Assessment/Plan: (ICD-10's: K81.0) 60 y.o. male with upper abdominal pain found to have large gallstone in gallbladder neck with [pericholecystic stranding concerning for acute cholecystitis, complicated by pertinent comorbidities including significant CAD, CHF with EF of 20-25%.   - Plan for robotic assisted laparoscopic cholecystectomy tomorrow (09/28) with Dr pending OR/Anesthesia availability - All risks, benefits, and alternatives to above procedure(s) were discussed with the patient, all of his questions were answered to his expressed satisfaction, patient expresses he wishes to proceed, and informed consent was obtained.    -  Appreciate cardiology assistance and pre-operative clearance - Okay to continue CLD today; NPO at midnight   - Continue IV Abx (Zosyn)              - Monitor abdominal examination             - Pain control prn; antiemetics prn             - Hold Eliquis for potential interventions; last took on Sunday at 0800    All of the above findings and recommendations were discussed with the patient, and the medical team, and all of patient's questions were answered to his expressed satisfaction.  -- Lynden Oxford, PA-C [facility] 04/02/2021, 7:27 AM [phone] M-F: 7am - 4pm

## 2021-04-03 ENCOUNTER — Encounter: Payer: Self-pay | Admitting: General Surgery

## 2021-04-03 ENCOUNTER — Inpatient Hospital Stay: Payer: Commercial Managed Care - PPO | Admitting: Anesthesiology

## 2021-04-03 ENCOUNTER — Encounter
Admission: EM | Disposition: A | Discharge status: Home Health | Payer: Self-pay | Source: Home / Self Care | Attending: Surgery

## 2021-04-03 DIAGNOSIS — K8000 Calculus of gallbladder with acute cholecystitis without obstruction: Secondary | ICD-10-CM | POA: Diagnosis not present

## 2021-04-03 LAB — BASIC METABOLIC PANEL
Anion gap: 11 (ref 5–15)
BUN: 29 mg/dL — ABNORMAL HIGH (ref 6–20)
CO2: 25 mmol/L (ref 22–32)
Calcium: 8.8 mg/dL — ABNORMAL LOW (ref 8.9–10.3)
Chloride: 103 mmol/L (ref 98–111)
Creatinine, Ser: 1.57 mg/dL — ABNORMAL HIGH (ref 0.61–1.24)
GFR, Estimated: 50 mL/min — ABNORMAL LOW (ref >60)
Glucose, Bld: 72 mg/dL (ref 70–99)
Potassium: 4.3 mmol/L (ref 3.5–5.1)
Sodium: 139 mmol/L (ref 135–145)

## 2021-04-03 LAB — CBC
HCT: 43.7 % (ref 39.0–52.0)
Hemoglobin: 13.4 g/dL (ref 13.0–17.0)
MCH: 31.5 pg (ref 26.0–34.0)
MCHC: 30.7 g/dL (ref 30.0–36.0)
MCV: 102.6 fL — ABNORMAL HIGH (ref 80.0–100.0)
Platelets: 143 10*3/uL — ABNORMAL LOW (ref 150–400)
RBC: 4.26 MIL/uL (ref 4.22–5.81)
RDW: 17.1 % — ABNORMAL HIGH (ref 11.5–15.5)
WBC: 8.8 10*3/uL (ref 4.0–10.5)
nRBC: 0 % (ref 0.0–0.2)

## 2021-04-03 SURGERY — CHOLECYSTECTOMY, ROBOT-ASSISTED, LAPAROSCOPIC
Anesthesia: General

## 2021-04-03 MED ORDER — DEXAMETHASONE SODIUM PHOSPHATE 10 MG/ML IJ SOLN
INTRAMUSCULAR | Status: DC | PRN
  Administered 2021-04-03: 5 mg via INTRAVENOUS

## 2021-04-03 MED ORDER — VISTASEAL 10 ML SINGLE DOSE KIT
PACK | CUTANEOUS | Status: DC | PRN
  Administered 2021-04-03: 10 mL via TOPICAL

## 2021-04-03 MED ORDER — PROPOFOL 10 MG/ML IV BOLUS
INTRAVENOUS | Status: DC | PRN
  Administered 2021-04-03: 120 mg via INTRAVENOUS

## 2021-04-03 MED ORDER — OXYCODONE HCL 5 MG PO TABS: 5.0000 mg | ORAL_TABLET | Freq: Once | ORAL | Status: DC | PRN

## 2021-04-03 MED ORDER — SODIUM CHLORIDE 0.9 % IV SOLN: INTRAVENOUS | Status: DC | PRN

## 2021-04-03 MED ORDER — SODIUM CHLORIDE 0.9 % IR SOLN
Status: DC | PRN
  Administered 2021-04-03: 1500 mL

## 2021-04-03 MED ORDER — OXYCODONE HCL 5 MG/5ML PO SOLN: 5.0000 mg | Freq: Once | ORAL | Status: DC | PRN

## 2021-04-03 MED ORDER — FENTANYL CITRATE (PF) 100 MCG/2ML IJ SOLN
INTRAMUSCULAR | Status: AC
  Filled 2021-04-03: qty 2

## 2021-04-03 MED ORDER — ROCURONIUM BROMIDE 100 MG/10ML IV SOLN
INTRAVENOUS | Status: DC | PRN
  Administered 2021-04-03: 30 mg via INTRAVENOUS

## 2021-04-03 MED ORDER — ONDANSETRON HCL 4 MG/2ML IJ SOLN
INTRAMUSCULAR | Status: AC
  Filled 2021-04-03: qty 2

## 2021-04-03 MED ORDER — PHENYLEPHRINE HCL (PRESSORS) 10 MG/ML IV SOLN
INTRAVENOUS | Status: AC
  Filled 2021-04-03: qty 1

## 2021-04-03 MED ORDER — ROCURONIUM BROMIDE 10 MG/ML (PF) SYRINGE
PREFILLED_SYRINGE | INTRAVENOUS | Status: AC
  Filled 2021-04-03: qty 10

## 2021-04-03 MED ORDER — 0.9 % SODIUM CHLORIDE (POUR BTL) OPTIME
TOPICAL | Status: DC | PRN
  Administered 2021-04-03: 10 mL

## 2021-04-03 MED ORDER — OXYCODONE HCL 5 MG PO TABS
10.0000 mg | ORAL_TABLET | ORAL | Status: DC | PRN
  Administered 2021-04-03 – 2021-04-10 (×12): 10 mg via ORAL
  Filled 2021-04-03 (×14): qty 2

## 2021-04-03 MED ORDER — ONDANSETRON HCL 4 MG/2ML IJ SOLN
INTRAMUSCULAR | Status: DC | PRN
  Administered 2021-04-03: 4 mg via INTRAVENOUS

## 2021-04-03 MED ORDER — PROPOFOL 10 MG/ML IV BOLUS
INTRAVENOUS | Status: AC
  Filled 2021-04-03: qty 20

## 2021-04-03 MED ORDER — SODIUM CHLORIDE 0.9 % IV SOLN
INTRAVENOUS | Status: DC | PRN
  Administered 2021-04-03: 45 ug/min via INTRAVENOUS

## 2021-04-03 MED ORDER — SUGAMMADEX SODIUM 200 MG/2ML IV SOLN
INTRAVENOUS | Status: DC | PRN
  Administered 2021-04-03: 200 mg via INTRAVENOUS

## 2021-04-03 MED ORDER — ALBUMIN HUMAN 5 % IV SOLN: INTRAVENOUS | Status: DC | PRN

## 2021-04-03 MED ORDER — SUCCINYLCHOLINE CHLORIDE 200 MG/10ML IV SOSY
PREFILLED_SYRINGE | INTRAVENOUS | Status: DC | PRN
Start: 2021-04-03 — End: 2021-04-03
  Administered 2021-04-03: 160 mg via INTRAVENOUS

## 2021-04-03 MED ORDER — AMIODARONE HCL 200 MG PO TABS: 200.0000 mg | ORAL_TABLET | Freq: Two times a day (BID) | ORAL | Status: DC

## 2021-04-03 MED ORDER — LIDOCAINE HCL (CARDIAC) PF 100 MG/5ML IV SOSY
PREFILLED_SYRINGE | INTRAVENOUS | Status: DC | PRN
  Administered 2021-04-03: 100 mg via INTRAVENOUS

## 2021-04-03 MED ORDER — VISTASEAL 10 ML SINGLE DOSE KIT
PACK | CUTANEOUS | Status: AC
  Filled 2021-04-03: qty 10

## 2021-04-03 MED ORDER — POTASSIUM CHLORIDE IN NACL 20-0.9 MEQ/L-% IV SOLN
INTRAVENOUS | Status: DC
  Filled 2021-04-03 (×4): qty 1000

## 2021-04-03 MED ORDER — PHENYLEPHRINE HCL (PRESSORS) 10 MG/ML IV SOLN
INTRAVENOUS | Status: DC | PRN
  Administered 2021-04-03 (×2): 100 ug via INTRAVENOUS

## 2021-04-03 MED ORDER — SUCCINYLCHOLINE CHLORIDE 200 MG/10ML IV SOSY
PREFILLED_SYRINGE | INTRAVENOUS | Status: AC
  Filled 2021-04-03: qty 10

## 2021-04-03 MED ORDER — LIDOCAINE HCL (PF) 2 % IJ SOLN
INTRAMUSCULAR | Status: AC
  Filled 2021-04-03: qty 5

## 2021-04-03 MED ORDER — DEXAMETHASONE SODIUM PHOSPHATE 10 MG/ML IJ SOLN
INTRAMUSCULAR | Status: AC
  Filled 2021-04-03: qty 1

## 2021-04-03 MED ORDER — MIDAZOLAM HCL 2 MG/2ML IJ SOLN
INTRAMUSCULAR | Status: AC
  Filled 2021-04-03: qty 2

## 2021-04-03 MED ORDER — MIDAZOLAM HCL 2 MG/2ML IJ SOLN
INTRAMUSCULAR | Status: DC | PRN
  Administered 2021-04-03: 1 mg via INTRAVENOUS

## 2021-04-03 MED ORDER — FENTANYL CITRATE (PF) 100 MCG/2ML IJ SOLN
25.0000 ug | INTRAMUSCULAR | Status: DC | PRN
  Administered 2021-04-03 (×2): 25 ug via INTRAVENOUS

## 2021-04-03 MED ORDER — FENTANYL CITRATE (PF) 100 MCG/2ML IJ SOLN
INTRAMUSCULAR | Status: AC
  Administered 2021-04-03: 25 ug via INTRAVENOUS
  Filled 2021-04-03: qty 2

## 2021-04-03 MED ORDER — PROPOFOL 10 MG/ML IV BOLUS
INTRAVENOUS | Status: AC
  Filled 2021-04-03: qty 40

## 2021-04-03 MED ORDER — HYDROCODONE-ACETAMINOPHEN 5-325 MG PO TABS
2.0000 | ORAL_TABLET | Freq: Four times a day (QID) | ORAL | Status: DC | PRN
  Administered 2021-04-03 – 2021-04-11 (×19): 2 via ORAL
  Filled 2021-04-03 (×19): qty 2

## 2021-04-03 MED ORDER — FENTANYL CITRATE (PF) 100 MCG/2ML IJ SOLN
INTRAMUSCULAR | Status: DC | PRN
  Administered 2021-04-03: 25 ug via INTRAVENOUS
  Administered 2021-04-03 (×2): 50 ug via INTRAVENOUS

## 2021-04-03 MED ORDER — LIDOCAINE-EPINEPHRINE 1 %-1:100000 IJ SOLN
INTRAMUSCULAR | Status: DC | PRN
  Administered 2021-04-03: 15 mL
  Administered 2021-04-03: 10 mL

## 2021-04-03 MED ORDER — LIDOCAINE-EPINEPHRINE 1 %-1:100000 IJ SOLN
INTRAMUSCULAR | Status: AC
  Filled 2021-04-03: qty 1

## 2021-04-03 MED ORDER — APIXABAN 5 MG PO TABS
5.0000 mg | ORAL_TABLET | Freq: Two times a day (BID) | ORAL | Status: DC
  Administered 2021-04-04 – 2021-04-11 (×15): 5 mg via ORAL
  Filled 2021-04-03 (×15): qty 1

## 2021-04-03 MED ORDER — BUPIVACAINE HCL (PF) 0.25 % IJ SOLN
INTRAMUSCULAR | Status: AC
  Filled 2021-04-03: qty 30

## 2021-04-03 MED ORDER — ACETAMINOPHEN 10 MG/ML IV SOLN
INTRAVENOUS | Status: DC | PRN
  Administered 2021-04-03: 1000 mg via INTRAVENOUS

## 2021-04-03 MED ORDER — NITROGLYCERIN 0.4 MG SL SUBL: 0.4000 mg | SUBLINGUAL_TABLET | SUBLINGUAL | Status: DC | PRN

## 2021-04-03 MED ORDER — ALBUMIN HUMAN 5 % IV SOLN
INTRAVENOUS | Status: AC
  Filled 2021-04-03: qty 250

## 2021-04-03 MED ORDER — APIXABAN 5 MG PO TABS: 5.0000 mg | ORAL_TABLET | Freq: Two times a day (BID) | ORAL | Status: DC

## 2021-04-03 MED ORDER — ACETAMINOPHEN 10 MG/ML IV SOLN
INTRAVENOUS | Status: AC
  Filled 2021-04-03: qty 100

## 2021-04-03 SURGICAL SUPPLY — 66 items
APPLICATOR VISTASEAL 35 (MISCELLANEOUS) ×2 IMPLANT
BAG INFUSER PRESSURE 100CC (MISCELLANEOUS) ×2 IMPLANT
BLADE SURG SZ11 CARB STEEL (BLADE) ×2 IMPLANT
BULB RESERV EVAC DRAIN JP 100C (MISCELLANEOUS) ×2 IMPLANT
CANNULA REDUC XI 12-8 STAPL (CANNULA) ×1
CANNULA REDUCER 12-8 DVNC XI (CANNULA) ×1 IMPLANT
CHLORAPREP W/TINT 26 (MISCELLANEOUS) ×2 IMPLANT
CLIP LIGATING HEMO O LOK GREEN (MISCELLANEOUS) ×4 IMPLANT
COVER TIP SHEARS 8 DVNC (MISCELLANEOUS) ×1 IMPLANT
COVER TIP SHEARS 8MM DA VINCI (MISCELLANEOUS) ×1
DECANTER SPIKE VIAL GLASS SM (MISCELLANEOUS) ×4 IMPLANT
DEFOGGER SCOPE WARMER CLEARIFY (MISCELLANEOUS) ×2 IMPLANT
DERMABOND ADVANCED (GAUZE/BANDAGES/DRESSINGS) ×1
DERMABOND ADVANCED .7 DNX12 (GAUZE/BANDAGES/DRESSINGS) ×1 IMPLANT
DRAIN CHANNEL JP 15F RND 16 (MISCELLANEOUS) ×2 IMPLANT
DRAPE ARM DVNC X/XI (DISPOSABLE) ×4 IMPLANT
DRAPE COLUMN DVNC XI (DISPOSABLE) ×1 IMPLANT
DRAPE DA VINCI XI ARM (DISPOSABLE) ×4
DRAPE DA VINCI XI COLUMN (DISPOSABLE) ×1
DRSG TEGADERM 4X4.75 (GAUZE/BANDAGES/DRESSINGS) ×4 IMPLANT
DRSG TELFA 4X8 ISLAND PHMB (GAUZE/BANDAGES/DRESSINGS) ×2 IMPLANT
ELECT CAUTERY BLADE TIP 2.5 (TIP) ×2
ELECT REM PT RETURN 9FT ADLT (ELECTROSURGICAL) ×2
ELECTRODE CAUTERY BLDE TIP 2.5 (TIP) ×1 IMPLANT
ELECTRODE REM PT RTRN 9FT ADLT (ELECTROSURGICAL) ×1 IMPLANT
GAUZE 4X4 16PLY ~~LOC~~+RFID DBL (SPONGE) ×2 IMPLANT
GLOVE SURG SYN 6.5 ES PF (GLOVE) ×4 IMPLANT
GLOVE SURG UNDER LTX SZ7 (GLOVE) ×6 IMPLANT
GOWN STRL REUS W/ TWL LRG LVL3 (GOWN DISPOSABLE) ×3 IMPLANT
GOWN STRL REUS W/TWL LRG LVL3 (GOWN DISPOSABLE) ×3
GRASPER SUT TROCAR 14GX15 (MISCELLANEOUS) IMPLANT
IRRIGATOR SUCT 8 DISP DVNC XI (IRRIGATION / IRRIGATOR) ×1 IMPLANT
IRRIGATOR SUCTION 8MM XI DISP (IRRIGATION / IRRIGATOR) ×1
IV NS 1000ML (IV SOLUTION) ×2
IV NS 1000ML BAXH (IV SOLUTION) ×2 IMPLANT
KIT IMAGING PINPOINTPAQ (MISCELLANEOUS) ×2 IMPLANT
KIT PINK PAD W/HEAD ARE REST (MISCELLANEOUS) ×2
KIT PINK PAD W/HEAD ARM REST (MISCELLANEOUS) ×1 IMPLANT
LABEL OR SOLS (LABEL) ×2 IMPLANT
MANIFOLD NEPTUNE II (INSTRUMENTS) ×2 IMPLANT
NEEDLE HYPO 22GX1.5 SAFETY (NEEDLE) ×2 IMPLANT
NEEDLE INSUFFLATION 14GA 120MM (NEEDLE) IMPLANT
NS IRRIG 500ML POUR BTL (IV SOLUTION) ×8 IMPLANT
OBTURATOR OPTICAL STANDARD 8MM (TROCAR) ×1
OBTURATOR OPTICAL STND 8 DVNC (TROCAR) ×1
OBTURATOR OPTICALSTD 8 DVNC (TROCAR) ×1 IMPLANT
PACK LAP CHOLECYSTECTOMY (MISCELLANEOUS) ×2 IMPLANT
PENCIL ELECTRO HAND CTR (MISCELLANEOUS) ×2 IMPLANT
POUCH SPECIMEN RETRIEVAL 10MM (ENDOMECHANICALS) ×2 IMPLANT
SEAL CANN UNIV 5-8 DVNC XI (MISCELLANEOUS) ×3 IMPLANT
SEAL XI 5MM-8MM UNIVERSAL (MISCELLANEOUS) ×3
SET TUBE SMOKE EVAC HIGH FLOW (TUBING) ×2 IMPLANT
SOLUTION ELECTROLUBE (MISCELLANEOUS) ×2 IMPLANT
STAPLER CANNULA SEAL DVNC XI (STAPLE) ×1 IMPLANT
STAPLER CANNULA SEAL XI (STAPLE) ×1
STRIP CLOSURE SKIN 1/2X4 (GAUZE/BANDAGES/DRESSINGS) ×2 IMPLANT
SUT ETHILON 3-0 FS-10 30 BLK (SUTURE) ×2
SUT MNCRL 4-0 (SUTURE) ×1
SUT MNCRL 4-0 27XMFL (SUTURE) ×1
SUT VIC AB 3-0 SH 27 (SUTURE)
SUT VIC AB 3-0 SH 27X BRD (SUTURE) IMPLANT
SUT VICRYL 0 AB UR-6 (SUTURE) IMPLANT
SUTURE EHLN 3-0 FS-10 30 BLK (SUTURE) ×1 IMPLANT
SUTURE MNCRL 4-0 27XMF (SUTURE) ×1 IMPLANT
TROCAR XCEL NON-BLD 5MMX100MML (ENDOMECHANICALS) IMPLANT
WATER STERILE IRR 500ML POUR (IV SOLUTION) ×2 IMPLANT

## 2021-04-03 NOTE — Op Note (Signed)
Operative note  Pre-operative Diagnosis: Acute calculus cholecystitis  Post-operative Diagnosis: Same  Procedure: Robot assisted laparoscopic cholecystectomy with intraoperative ICG cholangiography  Surgeon: Duanne Guess, MD  Anesthesia: GETA  Findings: Cirrhosis of the liver, ascites (roughly 800 to 1000 cc), exceptionally friable tissues that bled with minimal manipulation, markedly inflamed gallbladder containing a large stone, normal biliary anatomy.  Estimated Blood Loss: 500 cc       Specimens: Gallbladder           Complications: none immediately apparent  Procedure In Detail: The patient was seen again in the holding room. The benefits, complications, treatment options, and expected outcomes were discussed with the patient. The risks of bleeding, infection, recurrence of symptoms, failure to resolve symptoms, bile duct damage, bile duct leak, retained common bile duct stone, bowel injury, any of which could require further surgery and/or ERCP, stent, or papillotomy were reviewed with the patient. The likelihood of improving the patient's symptoms with return to their baseline status is good.  The patient and/or family concurred with the proposed plan, giving informed consent.  The patient was taken to operating room, identified and the procedure verified as laparoscopic cholecystectomy.  A time out was held and the above information confirmed.  Prior to the induction of general anesthesia, antibiotic prophylaxis was administered. VTE prophylaxis was in place. General endotracheal anesthesia was then administered and tolerated well. After the induction, the abdomen was prepped with Chloraprep and draped in the sterile fashion. The patient was positioned in the supine position.  Optiview technique was used to enter the abdomen in the left upper quadrant via a standard 5 mm laparoscopic trocar.  Pneumoperitoneum was then created with CO2 and tolerated well without any adverse  changes in the patient's vital signs.  A 12 mm robotic trocar along with three 8-mm robotic trochars were placed under direct vision.  All skin incisions were infiltrated with a local anesthetic agent before making the incision and placing the trocars.   The patient was positioned in 15 degrees of reverse Trendelenburg and tilted 10 degrees to the left.  The robot was brought to the surgical field and docked in the standard fashion.  We made sure all the instrumentation was kept in direct view at all times and that there were no collision between the arms.  I scrubbed out and went to the console.  The gallbladder was identified, the fundus grasped and retracted cephalad.  All tissues bled briskly with minimal manipulation.  Adhesions were lysed bluntly. The infundibulum was grasped and retracted laterally, however this did not expose the peritoneum overlying the triangle of Coelho.  After struggling to achieve exposure, I elected to proceed in a top-down fashion.  The liver was retracted and the gallbladder fundus grasped with the fenestrated bipolar forceps.  The gallbladder was dissected off of the liver bed.  The liver itself was quite friable and also blood with minimal tension or manipulation.  Oozing was treated with electrocautery.  Once I had about half of the gallbladder off of the liver bed, I was able to read grasped at the infundibulum, thereby exposing the peritoneum overlying the triangle of [place]. This was then divided and exposed in a blunt fashion. An extended critical view of the cystic duct and cystic artery was obtained.  The cystic duct was clearly identified and bluntly dissected away from the surrounding tissues, as was the cystic artery.  Using ICG cholangiography we visualized the cystic duct and confirmed that there was no aberrant biliary ductal  anatomy nor any evidence of bile duct injury.  Both cystic duct and cystic artery were clipped and divided. The remaining attachments of  the gallbladder to the liver were divided with the electrocautery. Hemostasis was achieved with the electrocautery. Inspection of the right upper quadrant was performed. No bleeding, bile duct injury or leak, or bowel injury was noted.  There was a large amount of clot in the abdomen at that point.  I aspirated the clot, along with somewhere around 500-1,000 cc of ascites with the robotic suction irrigator.  Some of the clot was too thick to break up and aspirate and it was placed in the Endopouch bag along with the gallbladder.  Vistaseal was applied to the gallbladder fossa and to the omentum that had been adherent to the gallbladder for additional hemostasis.  A drain was placed behind the liver dome and into the gallbladder fossa, bringing it out through the left lower quadrant trocar site.  The robotic instruments were removed and robotic arms were undocked in the standard fashion.    I scrubbed back in.  The gallbladder was removed via the 12 mm trocar site. The 12 mm port site was then closed with interrupted 0 Vicryl sutures.  The remaining 8 mm ports were removed and pneumoperitoneum was released.  Each port site was closed with deep dermal 3-0 Vicryl.  4-0 subcuticular Monocryl was used to close the skin. Dermabond was applied, followed by Steri-Strips.  The drain was sutured in place with a 3-0 nylon suture and covered with a drain sponge.  The patient was then awakened, extubated, and taken to the postanesthesia recovery unit in stable condition.   Sponge, lap, and needle counts were reported to be correct number at closure and at the conclusion of the case.          Duanne Guess, MD FACS

## 2021-04-03 NOTE — Anesthesia Postprocedure Evaluation (Signed)
Anesthesia Post Note  Patient: DYLEN MCELHANNON  Procedure(s) Performed: XI ROBOTIC ASSISTED LAPAROSCOPIC CHOLECYSTECTOMY INDOCYANINE GREEN FLUORESCENCE IMAGING (ICG)  Patient location during evaluation: PACU Anesthesia Type: General Level of consciousness: awake and alert Pain management: pain level controlled Vital Signs Assessment: post-procedure vital signs reviewed and stable Respiratory status: spontaneous breathing, nonlabored ventilation, respiratory function stable and patient connected to nasal cannula oxygen Cardiovascular status: blood pressure returned to baseline and stable Postop Assessment: no apparent nausea or vomiting Anesthetic complications: no   No notable events documented.   Last Vitals:  Vitals:   04/03/21 1145 04/03/21 1200  BP: 115/81 114/78  Pulse: 96 92  Resp: (!) 21 16  Temp: (!) 36 C   SpO2: 94% 95%    Last Pain:  Vitals:   04/03/21 1200  TempSrc:   PainSc: 5                  Cleda Mccreedy [place]

## 2021-04-03 NOTE — Transfer of Care (Signed)
Immediate Anesthesia Transfer of Care Note  Patient: SOU NOHR  Procedure(s) Performed: XI ROBOTIC ASSISTED LAPAROSCOPIC CHOLECYSTECTOMY INDOCYANINE GREEN FLUORESCENCE IMAGING (ICG)  Patient Location: PACU  Anesthesia Type:General  Level of Consciousness: awake and alert   Airway & Oxygen Therapy: Patient Spontanous Breathing and Patient connected to face mask oxygen  Post-op Assessment: Report given to RN and Post -op Vital signs reviewed and stable  Post vital signs: Reviewed and stable  Last Vitals:  Vitals Value Taken Time  BP 119/86 04/03/21 1112  Temp 35.8 C 04/03/21 1112  Pulse 100 04/03/21 1115  Resp 25 04/03/21 1115  SpO2 98 % 04/03/21 1115  Vitals shown include unvalidated device data.  Last Pain:  Vitals:   04/03/21 0700  TempSrc: Oral  PainSc: 0-No pain      Patients Stated Pain Goal: 0 (04/01/21 1900)  Complications: No notable events documented.

## 2021-04-03 NOTE — Anesthesia Procedure Notes (Signed)
Procedure Name: Intubation Date/Time: 04/03/2021 7:45 AM Performed by: Fredderick Phenix, CRNA Pre-anesthesia Checklist: Patient identified, Emergency Drugs available, Suction available and Patient being monitored Patient Re-evaluated:Patient Re-evaluated prior to induction Oxygen Delivery Method: Circle system utilized Preoxygenation: Pre-oxygenation with 100% oxygen Induction Type: IV induction Ventilation: Mask ventilation without difficulty Laryngoscope Size: Mac and 3 Grade View: Grade I Tube type: Oral Tube size: 7.5 mm Number of attempts: 1 Airway Equipment and Method: Stylet and Oral airway Placement Confirmation: ETT inserted through vocal cords under direct vision, positive ETCO2 and breath sounds checked- equal and bilateral Secured at: 21 cm Tube secured with: Tape Dental Injury: Teeth and Oropharynx as per pre-operative assessment

## 2021-04-03 NOTE — Progress Notes (Addendum)
[facility] SURGICAL PROGRESS NOTE  Hospital Day(s): 3.   Post op day(s): Day of Surgery.   Interval History:  Patient seen and examined No acute events or new complaints overnight.  Patient reports abdominal pain continues to improve No fever, chills Leukocytosis resolved this morning; now 8.8k BMP is pending NPO this morning Continues on Zosyn Plan for cholecystectomy this morning   Vital signs in last 24 hours: [min-max] current  Temp:  [97.9 F (36.6 C)-98.6 F (37 C)] 97.9 F (36.6 C) (09/28 0446) Pulse Rate:  [66-96] 96 (09/28 0446) Resp:  [18] 18 (09/28 0446) BP: (90-117)/(66-90) 117/90 (09/28 0446) SpO2:  [91 %-99 %] 94 % (09/28 0446)     Height: 5\' 7"  (170.2 cm) Weight: 83.9 kg BMI (Calculated): 28.97   Intake/Output last 2 shifts:  09/27 0701 - 09/28 0700 In: 1161 [P.O.:1110; IV Piggyback:51] Out: -    Physical Exam:  Constitutional: alert, cooperative and no distress  HENT: normocephalic without obvious abnormality  Eyes: PERRL, EOM's grossly intact and symmetric  Respiratory: breathing non-labored at rest; on [place] Cardiovascular: regular rate and sinus rhythm  Gastrointestinal: soft, RUQ tenderness improved, and non-distended, no rebound/guarding Musculoskeletal no edema or wounds, motor and sensation grossly intact, NT   Labs:  CBC Latest Ref Rng & Units 04/03/2021 04/02/2021 04/01/2021  WBC 4.0 - 10.5 K/uL 8.8 12.9(H) 10.5  Hemoglobin 13.0 - 17.0 g/dL 04/03/2021 12.8(L) 15.0  Hematocrit 39.0 - 52.0 % 43.7 41.8 48.8  Platelets 150 - 400 K/uL 143(L) 128(L) 146(L)   CMP Latest Ref Rng & Units 04/02/2021 04/01/2021 03/31/2021  Glucose 70 - 99 mg/dL 82 04/02/2021) 563(S)  BUN 6 - 20 mg/dL 937(D) 16 18  Creatinine 0.61 - 1.24 mg/dL 42(A) 7.68(T 1.57)  Sodium 135 - 145 mmol/L 138 142 140  Potassium 3.5 - 5.1 mmol/L 4.6 3.7 2.9(L)  Chloride 98 - 111 mmol/L 105 102 98  CO2 22 - 32 mmol/L 27 30 30   Calcium 8.9 - 10.3 mg/dL 8.0(L) 8.6(L) 9.0  Total Protein  6.5 - 8.1 g/dL 6.0(L) 6.7 7.3  Total Bilirubin 0.3 - 1.2 mg/dL 2.1(H) 1.6(H) 1.4(H)  Alkaline Phos 38 - 126 U/L 116 125 149(H)  AST 15 - 41 U/L 15 16 20   ALT 0 - 44 U/L 8 7 10      Imaging studies: No new pertinent imaging studies   Assessment/Plan: (ICD-10's: K81.0) 60 y.o. male with upper abdominal pain found to have large gallstone in gallbladder neck with [pericholecystic stranding concerning for acute cholecystitis, complicated by pertinent comorbidities including significant CAD, CHF with EF of 20-25%.               - Plan for robotic assisted laparoscopic cholecystectomy today (09/28) with Dr pending OR/Anesthesia availability - All risks, benefits, and alternatives to above procedure(s) were discussed with the patient, all of his questions were answered to his expressed satisfaction, patient expresses he wishes to proceed, and informed consent was obtained.               - Appreciate cardiology assistance and pre-operative clearance - NPO for procedure; ADAT after surgery  - Continue IV Abx (Zosyn)              - Monitor abdominal examination             - Pain control prn; antiemetics prn             - Hold Eliquis for surgery; last took on Sunday at 0800;  restart as soon as possible post-operatively    All of the above findings and recommendations were discussed with the patient, and the medical team, and all of patient's questions were answered to his expressed satisfaction.  -- Lynden Oxford, PA-C [facility] 04/03/2021, 6:56 AM [phone] M-F: 7am - 4pm  I saw and evaluated the patient.  I agree with the above documentation, exam, and plan, which I have edited where appropriate. Duanne Guess  7:14 AM

## 2021-04-03 NOTE — Anesthesia Preprocedure Evaluation (Signed)
Anesthesia Evaluation  Patient identified by MRN, date of birth, ID band Patient awake    Reviewed: Allergy & Precautions, NPO status , Patient's Chart, lab work & pertinent test results  History of Anesthesia Complications Negative for: history of anesthetic complications  Airway Mallampati: III  TM Distance: >3 FB Neck ROM: full    Dental  (+) Chipped   Pulmonary COPD, Current Smoker,    Pulmonary exam normal        Cardiovascular hypertension, (-) angina+ CAD, + Past MI, + Peripheral Vascular Disease and +CHF  (-) DOE Normal cardiovascular exam     Neuro/Psych negative neurological ROS  negative psych ROS   GI/Hepatic negative GI ROS, Neg liver ROS,   Endo/Other  negative endocrine ROS  Renal/GU Renal disease     Musculoskeletal  (+) Arthritis ,   Abdominal   Peds  Hematology negative hematology ROS (+)   Anesthesia Other Findings Past Medical History: No date: CHF (congestive heart failure) (HCC) No date: Coronary artery disease     Comment:  a. 12/17 PCI with DES to pLAD with resdiual disease (RX               therapy) No date: Hyperlipidemia No date: Hypertension No date: PAD (peripheral artery disease) (HCC) 2017: STEMI (ST elevation myocardial infarction) (HCC)  Past Surgical History: 06/17/2016: CARDIAC CATHETERIZATION; N/A     Comment:  Procedure: Left Heart Cath and Coronary Angiography;                Surgeon: Runell Gess, MD;  Location: [facility] INVASIVE CV               LAB;  Service: Cardiovascular;  Laterality: N/A; 06/17/2016: CARDIAC CATHETERIZATION; N/A     Comment:  Procedure: Coronary Stent Intervention;  Surgeon:               Runell Gess, MD;  Location: [facility] INVASIVE CV LAB;                Service: Cardiovascular;  Laterality: N/A; No date: cardiac stents No date: CERVICAL SPINE SURGERY 06/25/2020: LOWER EXTREMITY ANGIOGRAPHY; Left     Comment:  Procedure: LOWER EXTREMITY  ANGIOGRAPHY;  Surgeon: Annice Needy, MD;  Location: [facility] INVASIVE CV LAB;  Service:               Cardiovascular;  Laterality: Left;  BMI    Body Mass Index: 29.76 kg/m      Reproductive/Obstetrics negative OB ROS                             Anesthesia Physical Anesthesia Plan  ASA: 4  Anesthesia Plan: General ETT   Post-op Pain Management:    Induction: Intravenous  PONV Risk Score and Plan: Ondansetron, Dexamethasone, Midazolam and Treatment may vary due to age or medical condition  Airway Management Planned: Oral ETT  Additional Equipment:   Intra-op Plan:   Post-operative Plan: Extubation in OR  Informed Consent: I have reviewed the patients History and Physical, chart, labs and discussed the procedure including the risks, benefits and alternatives for the proposed anesthesia with the patient or authorized representative who has indicated his/her understanding and acceptance.     Dental Advisory Given  Plan Discussed with: Anesthesiologist, CRNA and Surgeon  Anesthesia Plan Comments: (Patient consented for risks of anesthesia including but not  limited to:  - adverse reactions to medications - damage to eyes, teeth, lips or other oral mucosa - nerve damage due to positioning  - sore throat or hoarseness - Damage to heart, brain, nerves, lungs, other parts of body or loss of life  Patient voiced understanding.)        Anesthesia Quick Evaluation

## 2021-04-04 LAB — COMPREHENSIVE METABOLIC PANEL
ALT: 14 U/L (ref 0–44)
AST: 35 U/L (ref 15–41)
Albumin: 3 g/dL — ABNORMAL LOW (ref 3.5–5.0)
Alkaline Phosphatase: 105 U/L (ref 38–126)
Anion gap: 6 (ref 5–15)
BUN: 38 mg/dL — ABNORMAL HIGH (ref 6–20)
CO2: 26 mmol/L (ref 22–32)
Calcium: 8.2 mg/dL — ABNORMAL LOW (ref 8.9–10.3)
Chloride: 106 mmol/L (ref 98–111)
Creatinine, Ser: 1.75 mg/dL — ABNORMAL HIGH (ref 0.61–1.24)
GFR, Estimated: 44 mL/min — ABNORMAL LOW (ref >60)
Glucose, Bld: 130 mg/dL — ABNORMAL HIGH (ref 70–99)
Potassium: 5.1 mmol/L (ref 3.5–5.1)
Sodium: 138 mmol/L (ref 135–145)
Total Bilirubin: 1 mg/dL (ref 0.3–1.2)
Total Protein: 5.9 g/dL — ABNORMAL LOW (ref 6.5–8.1)

## 2021-04-04 LAB — CBC
HCT: 37.2 % — ABNORMAL LOW (ref 39.0–52.0)
Hemoglobin: 11.7 g/dL — ABNORMAL LOW (ref 13.0–17.0)
MCH: 32.5 pg (ref 26.0–34.0)
MCHC: 31.5 g/dL (ref 30.0–36.0)
MCV: 103.3 fL — ABNORMAL HIGH (ref 80.0–100.0)
Platelets: 153 10*3/uL (ref 150–400)
RBC: 3.6 MIL/uL — ABNORMAL LOW (ref 4.22–5.81)
RDW: 16.7 % — ABNORMAL HIGH (ref 11.5–15.5)
WBC: 8.1 10*3/uL (ref 4.0–10.5)
nRBC: 0 % (ref 0.0–0.2)

## 2021-04-04 LAB — SURGICAL PATHOLOGY

## 2021-04-04 LAB — MAGNESIUM: Magnesium: 2.1 mg/dL (ref 1.7–2.4)

## 2021-04-04 LAB — PHOSPHORUS: Phosphorus: 5.3 mg/dL — ABNORMAL HIGH (ref 2.5–4.6)

## 2021-04-04 MED ORDER — SODIUM CHLORIDE 0.9 % IV SOLN: INTRAVENOUS | Status: DC

## 2021-04-04 NOTE — Progress Notes (Signed)
[facility]  SUBJECTIVE: Patient laying in bed, watching TV, denies chest pain or shortness of breath   Vitals:   04/03/21 1555 04/03/21 1959 04/03/21 2302 04/04/21 0411  BP: 109/85 116/84 101/73 104/77  Pulse: (!) 104 89  77  Resp: 20 18  18   Temp: 98.2 F (36.8 C)   98.2 F (36.8 C)  TempSrc: Oral     SpO2: 99% 91%  96%  Weight:      Height:         Intake/Output Summary (Last 24 hours) at 04/04/2021 0651 Last data filed at 04/04/2021 0518 Gross per 24 hour  Intake 2226.08 ml  Output 1740 ml  Net 486.08 ml      PHYSICAL EXAM  General: Well developed, well nourished, in no acute distress HEENT:  Normocephalic and atramatic Neck:  No JVD.  Lungs: Clear bilaterally to auscultation and percussion. Heart: HRRR . Normal S1 and S2 without gallops or murmurs.  Abdomen: Bowel sounds are positive, abdomen soft and non-tender  Msk:  Back normal, normal gait. Normal strength and tone for age. Extremities: No clubbing, cyanosis or edema.   Neuro: Alert and oriented X 3. Psych:  Good affect, responds appropriately   LABS: Basic Metabolic Panel: Recent Labs    04/02/21 0419 04/03/21 0604 04/04/21 0406  NA 138 139 138  K 4.6 4.3 5.1  CL 105 103 106  CO2 27 25 26   GLUCOSE 82 72 130*  BUN 23* 29* 38*  CREATININE 1.50* 1.57* 1.75*  CALCIUM 8.0* 8.8* 8.2*  MG 1.9  --  2.1  PHOS  --   --  5.3*   Liver Function Tests: Recent Labs    04/02/21 0419 04/04/21 0406  AST 15 35  ALT 8 14  ALKPHOS 116 105  BILITOT 2.1* 1.0  PROT 6.0* 5.9*  ALBUMIN 3.1* 3.0*   No results for input(s): LIPASE, AMYLASE in the last 72 hours. CBC: Recent Labs    04/03/21 0604 04/04/21 0406  WBC 8.8 8.1  HGB 13.4 11.7*  HCT 43.7 37.2*  MCV 102.6* 103.3*  PLT 143* 153   Cardiac Enzymes: No results for input(s): CKTOTAL, CKMB, CKMBINDEX, TROPONINI in the last 72 hours. BNP: Invalid input(s): POCBNP D-Dimer: No results for input(s): DDIMER in the last 72 hours. Hemoglobin  A1C: No results for input(s): HGBA1C in the last 72 hours. Fasting Lipid Panel: No results for input(s): CHOL, HDL, LDLCALC, TRIG, CHOLHDL, LDLDIRECT in the last 72 hours. Thyroid Function Tests: No results for input(s): TSH, T4TOTAL, T3FREE, THYROIDAB in the last 72 hours.  Invalid input(s): FREET3 Anemia Panel: No results for input(s): VITAMINB12, FOLATE, FERRITIN, TIBC, IRON, RETICCTPCT in the last 72 hours.  No results found.   Echo   TELEMETRY: :  ASSESSMENT AND PLAN:  Active Problems:   Acute calculous cholecystitis    1.   Acute cholecystitis, status post robotic assisted laparoscopic cholecystectomy, without complication 2.  Coronary artery disease, status post non-STEMI and complex stenting 2006, status post anterior STEMI/06/2016, status post primary PCI with DES LAD, without chest pain 3.  Ischemic cardiomyopathy with LVEF 20 to 25% 4.  Chronic systolic congestive heart failure, currently appears euvolemic 5.  Paroxysmal atrial fibrillation/atrial flutter, on Eliquis for stroke prevention and amiodarone for rate and rhythm control   Recommendations   1.  Agree with overall current therapy 2.  Continue carvedilol, uptitrate as needed 3.  Continue amiodarone 200 mg twice daily 4.  Continue Eliquis 5 mg twice daily 5.  Lasix as needed 6.  Defer further cardiac diagnostic 7.  Follow-up with Dr. [name] 1 to 2 weeks  Sign off for now, please call if any questions   Marcina Millard, MD, PhD, [facility] 04/04/2021 6:51 AM

## 2021-04-04 NOTE — TOC Initial Note (Signed)
Transition of Care [facility]) - Initial/Assessment Note    Patient Details  Name: Cameron [name] MRN: [id number] Date of Birth: 25-Jun-1961  Transition of Care [facility]) CM/SW Contact:    Candie Chroman, LCSW Phone Number: 04/04/2021, 3:19 PM  Clinical Narrative:   Readmission prevention screen complete. CSW met with patient. No supports at bedside. CSW introduced role and explained that discharge planning would be discussed. Patient is not active with a PCP at this time. He typically follows up at the [facility] clinic. His previous PCP retired. [facility] does not have appts until around February and [facility] does not have appts until spring. Left voicemail at [facility] If they do not have appointment anytime soon, will call [facility] for new patient appointment. Patient drives himself to appointments. [facility] [place]. No issues obtaining medications. No home health prior to admission. He has a rolling walker, rollator, and crutches at home. Patient was on 3 L oxygen this morning. Per RN, patient has been trying to wean himself off. Will follow for this potential need. No further concerns. CSW encouraged patient to contact CSW as needed. CSW will continue to follow patient for support and facilitate return home when stable.              Expected Discharge Plan: Home/Self Care Barriers to Discharge: Continued Medical Work up   Patient Goals and CMS Choice        Expected Discharge Plan and Services Expected Discharge Plan: Home/Self Care       Living arrangements for the past 2 months: Single Family Home                                      Prior Living Arrangements/Services Living arrangements for the past 2 months: Single Family Home   Patient language and need for interpreter reviewed:: Yes Do you feel safe going back to the place where you live?: Yes      Need for Family Participation in Patient Care: Yes  (Comment) Care giver support system in place?: Yes (comment) Current home services: DME Criminal Activity/Legal Involvement Pertinent to Current Situation/Hospitalization: No - Comment as needed  Activities of Daily Living Home Assistive Devices/Equipment: None ADL Screening (condition at time of admission) Patient's cognitive ability adequate to safely complete daily activities?: Yes Is the patient deaf or have difficulty hearing?: No Does the patient have difficulty seeing, even when wearing glasses/contacts?: No Does the patient have difficulty concentrating, remembering, or making decisions?: No Patient able to express need for assistance with ADLs?: Yes Does the patient have difficulty dressing or bathing?: No Independently performs ADLs?: Yes (appropriate for developmental age) Does the patient have difficulty walking or climbing stairs?: No Weakness of Legs: None Weakness of Arms/Hands: None  Permission Sought/Granted Permission sought to share information with : PCP                Emotional Assessment Appearance:: Appears stated age Attitude/Demeanor/Rapport: Engaged, Gracious Affect (typically observed): Accepting, Appropriate, Calm, Pleasant Orientation: : Oriented to Self, Oriented to Place, Oriented to  Time, Oriented to Situation Alcohol / Substance Use: Not Applicable Psych Involvement: No (comment)  Admission diagnosis:  Cholecystitis [K81.9] Acute calculous cholecystitis [K80.00] Chest pain [R07.9] Patient Active Problem List   Diagnosis Date Noted   Acute calculous cholecystitis 03/31/2021   Dry gangrene (HCC)    Cellulitis of left  foot 08/17/2020   Pressure injury of skin 07/24/2020   Acute gouty arthritis 54/49/2010   Chronic systolic CHF (congestive heart failure) ([place]) 07/24/2020   Atrial fibrillation, chronic ([place]) 07/24/2020   Iron deficiency anemia 07/24/2020   Leukocytosis 07/24/2020   CKD (chronic kidney disease), stage II    Hypotension     Acute on chronic respiratory failure with hypoxia (HCC)    PVD (peripheral vascular disease) ([place])    Acute renal failure superimposed on stage 3a chronic kidney disease (HCC)    Macrocytic anemia    Atherosclerosis of native arteries of extremity with rest pain ([place]) 06/19/2020   Acute on chronic systolic CHF (congestive heart failure) ([place]) 02/17/2020   Hypokalemia 02/17/2020   Hypomagnesemia 02/17/2020   Elevated troponin 02/17/2020   New onset atrial fibrillation ([place]) 02/17/2020   Olecranon bursitis of left elbow 02/17/2020   NSVT (nonsustained ventricular tachycardia) ([place]) 02/17/2020   Tobacco abuse 02/17/2020   Apical mural thrombus 02/17/2020   Gout_left elbow 02/17/2020   Bilateral leg edema 10/19/2018   Weight gain 10/19/2018   Chest pain 02/02/2018   Acute pain of right knee 03/13/2017   Essential hypertension 06/20/2016   Hyperlipidemia 06/20/2016   CAD (coronary artery disease), native coronary artery 06/20/2016   STEMI involving oth coronary artery of anterior wall ([place]) 06/17/2016   STEMI (ST elevation myocardial infarction) ([place]) 06/17/2016   PCP:  [place] [facility]   [id number] [id number]: [phone]: [phone]    Social Determinants of Health (SDOH) Interventions    Readmission Risk Interventions Readmission Risk Prevention Plan 04/04/2021  Transportation Screening Complete  PCP or Specialist Appt within 3-5 Days Complete  Social Work Consult for [place]sel[place] Complete  Palliative Care Screening Not Applicable  Medication Review (Press photographer Complete  Some recent data might be hidden

## 2021-04-04 NOTE — Progress Notes (Addendum)
[facility] SURGICAL PROGRESS NOTE  Hospital Day(s): 4.   Post op day(s): 1 Day Post-Op.   Interval History:  Patient seen and examined No acute events or new complaints overnight.  Patient reports he has abdominal soreness worse with moving around but otherwise doing well No fever, chills, nausea, emesis He remains without leukocytosis; 8.1K Hgb remains stable; some dilution, to 11.7 Slight bump in renal function; sCr - 1.75; UO - 350 ccs + unmeasured (Baseline sCr seems to be 1.1 - 1.4) No significant electrolyte derangements LFTs are in normal ranges Surgical drain with 290 ccs out; serosanguinous He is on heart healthy diet; tolerating well  Vital signs in last 24 hours: [min-max] current  Temp:  [96.4 F (35.8 C)-98.2 F (36.8 C)] 98.2 F (36.8 C) (09/29 0411) Pulse Rate:  [77-104] 77 (09/29 0411) Resp:  [16-22] 18 (09/29 0411) BP: (101-129)/(73-90) 104/77 (09/29 0411) SpO2:  [91 %-100 %] 96 % (09/29 0411)     Height: 5\' 7"  (170.2 cm) Weight: 86.2 kg BMI (Calculated): 29.75   Intake/Output last 2 shifts:  09/28 0701 - 09/29 0700 In: 2226.1 [I.V.:1718.9; IV Piggyback:507.2] Out: 1740 [Urine:350; Drains:290; Blood:500]   Physical Exam:  Constitutional: alert, cooperative and no distress  Respiratory: breathing non-labored at rest, on [place]  Cardiovascular: regular rate and sinus rhythm  Gastrointestinal: Soft, incisional soreness, non-distended, no rebound/guarding. Drain in LLQ with serosanguinous output and reinforced dressing. Integumentary: Laparoscopic incisions are CDI with steri-strips, no erythema or drainage   Labs:  CBC Latest Ref Rng & Units 04/04/2021 04/03/2021 04/02/2021  WBC 4.0 - 10.5 K/uL 8.1 8.8 12.9(H)  Hemoglobin 13.0 - 17.0 g/dL 11.7(L) 13.4 12.8(L)  Hematocrit 39.0 - 52.0 % 37.2(L) 43.7 41.8  Platelets 150 - 400 K/uL 153 143(L) 128(L)   CMP Latest Ref Rng & Units 04/04/2021 04/03/2021 04/02/2021  Glucose 70 - 99 mg/dL 04/04/2021) 72 82   BUN 6 - 20 mg/dL 295(J) 88(C) 16(S)  Creatinine 0.61 - 1.24 mg/dL 06(T) 0.16(W) 1.09(N)  Sodium 135 - 145 mmol/L 138 139 138  Potassium 3.5 - 5.1 mmol/L 5.1 4.3 4.6  Chloride 98 - 111 mmol/L 106 103 105  CO2 22 - 32 mmol/L 26 25 27   Calcium 8.9 - 10.3 mg/dL 8.2(L) 8.8(L) 8.0(L)  Total Protein 6.5 - 8.1 g/dL 5.9(L) - 6.0(L)  Total Bilirubin 0.3 - 1.2 mg/dL 1.0 - 2.1(H)  Alkaline Phos 38 - 126 U/L 105 - 116  AST 15 - 41 U/L 35 - 15  ALT 0 - 44 U/L 14 - 8     Imaging studies: No new pertinent imaging studies   Assessment/Plan:  60 y.o. male with AKI otherwise doing well 1 Day Post-Op s/p robotic assisted laparoscopic cholecystectomy for acute cholecystitis, complicated by pertinent comorbidities including CAD, CHF with EF of 20-25%.   - Continue heart healthy diet   - Continue IVF resuscitation  - Continue IV Abx (Zosyn)              - Monitor abdominal examination  - Monitor renal function; worsening today  - Maintain surgical drain; monitor and record output; he did have ascites and cirrhosis seen intra-operatively so anticipate high output              - Pain control prn; antiemetics prn             - Hold Eliquis for surgery; last took on Sunday at 0800; restarted today.   - Discharge Planning; Given worsening renal function, suspect he  will benefit from additional 24 hours for IVF resuscitation and observation prior to discharge home  All of the above findings and recommendations were discussed with the patient, and the medical team, and all of patient's questions were answered to his expressed satisfaction.  -- Lynden Oxford, PA-C [facility] 04/04/2021, 7:43 AM [phone] M-F: 7am - 4pm  I saw and evaluated the patient.  I agree with the above documentation, exam, and plan, which I have edited where appropriate. Duanne Guess  12:32 PM

## 2021-04-04 NOTE — Progress Notes (Signed)
Mobility Specialist - Progress Note   04/04/21 1600  Mobility  Activity Ambulated in hall  Level of Assistance Standby assist, set-up cues, supervision of patient - no hands on  Assistive Device Other (Comment) (IV Pole)  Distance Ambulated (ft) 140 ft  Mobility Ambulated with assistance in hallway  Mobility Response Tolerated well  Mobility performed by Mobility specialist  $Mobility charge 1 Mobility    Pt lying in bed utilizing RA. ModI to exit bed, extra time needed once standing. Pain 8/10 at incision site with R > L and described as a "sharp, stabbing pain in R side". O2 desat to 83% with activity, pt continued denying SOB. No s/s of distress or fatigue. Pt returned supine with needs in reach.    Filiberto Pinks Mobility Specialist 04/04/21, 4:13 PM

## 2021-04-05 DIAGNOSIS — I4892 Unspecified atrial flutter: Secondary | ICD-10-CM

## 2021-04-05 LAB — BASIC METABOLIC PANEL
Anion gap: 7 (ref 5–15)
BUN: 46 mg/dL — ABNORMAL HIGH (ref 6–20)
CO2: 25 mmol/L (ref 22–32)
Calcium: 8.4 mg/dL — ABNORMAL LOW (ref 8.9–10.3)
Chloride: 108 mmol/L (ref 98–111)
Creatinine, Ser: 2.14 mg/dL — ABNORMAL HIGH (ref 0.61–1.24)
GFR, Estimated: 35 mL/min — ABNORMAL LOW (ref >60)
Glucose, Bld: 116 mg/dL — ABNORMAL HIGH (ref 70–99)
Potassium: 5.7 mmol/L — ABNORMAL HIGH (ref 3.5–5.1)
Sodium: 140 mmol/L (ref 135–145)

## 2021-04-05 MED ORDER — ALBUMIN HUMAN 25 % IV SOLN
25.0000 g | Freq: Once | INTRAVENOUS | Status: AC
  Administered 2021-04-05: 25 g via INTRAVENOUS
  Filled 2021-04-05: qty 100

## 2021-04-05 MED ORDER — INFLUENZA VAC A&B SA ADJ QUAD 0.5 ML IM PRSY
0.5000 mL | PREFILLED_SYRINGE | INTRAMUSCULAR | Status: DC
  Filled 2021-04-05: qty 0.5

## 2021-04-05 MED ORDER — SODIUM ZIRCONIUM CYCLOSILICATE 10 G PO PACK
10.0000 g | PACK | Freq: Once | ORAL | Status: AC
  Administered 2021-04-05: 10 g via ORAL
  Filled 2021-04-05: qty 1

## 2021-04-05 MED ORDER — POLYETHYLENE GLYCOL 3350 17 G PO PACK
17.0000 g | PACK | Freq: Every day | ORAL | Status: DC
  Administered 2021-04-05 – 2021-04-09 (×5): 17 g via ORAL
  Filled 2021-04-05 (×6): qty 1

## 2021-04-05 MED ORDER — ALBUMIN HUMAN 25 % IV SOLN
25.0000 g | Freq: Once | INTRAVENOUS | Status: DC
  Filled 2021-04-05: qty 100

## 2021-04-05 MED ORDER — SODIUM ZIRCONIUM CYCLOSILICATE 10 G PO PACK: 10.0000 g | PACK | Freq: Once | ORAL | Status: DC

## 2021-04-05 MED ORDER — COVID-19 MRNA VAC-TRIS(PFIZER) 30 MCG/0.3ML IM SUSP
0.3000 mL | Freq: Once | INTRAMUSCULAR | Status: AC
Start: 2021-04-06 — End: 2021-04-07
  Filled 2021-04-05: qty 0.3

## 2021-04-05 NOTE — Progress Notes (Addendum)
[facility] SURGICAL PROGRESS NOTE  Hospital Day(s): 5.   Post op day(s): 2 Days Post-Op.   Interval History:  Patient seen and examined No acute events or new complaints overnight.  Patient reports he has abdominal soreness worse with moving, but this is improved compared to yesterday No fever, chills, nausea, emesis Renal function is worsening; sCr - 1.75; UO - 650 ccs + unmeasured (Baseline sCr seems to be 1.1 - 1.4). He does describe his urine as "cherry kool-aid color" Hyperkalemia to 5.7 Surgical drain with 350 ccs out; serosanguinous He is on heart healthy diet; tolerating well  Vital signs in last 24 hours: [min-max] current  Temp:  [97.6 F (36.4 C)-98.6 F (37 C)] 97.6 F (36.4 C) (09/30 0310) Pulse Rate:  [67-91] 89 (09/30 0310) Resp:  [18] 18 (09/30 0310) BP: (97-125)/(69-93) 125/85 (09/30 0310) SpO2:  [93 %-98 %] 93 % (09/30 0310)     Height: 5\' 7"  (170.2 cm) Weight: 86.2 kg BMI (Calculated): 29.75   Intake/Output last 2 shifts:  09/29 0701 - 09/30 0700 In: 2586.7 [P.O.:360; I.V.:2023.9; IV Piggyback:202.8] Out: 1000 [Urine:650; Drains:350]   Physical Exam:  Constitutional: alert, cooperative and no distress  Respiratory: breathing non-labored at rest, on [place]  Cardiovascular: regular rate and sinus rhythm  Gastrointestinal: Soft, incisional soreness, non-distended, no rebound/guarding. Drain in LLQ with serosanguinous output and reinforced dressing. Genitourinary: Urinal at bedside has a small amount of dark "coca=cola" colored urine Integumentary: Laparoscopic incisions are CDI with steri-strips, no erythema or drainage   Labs:  CBC Latest Ref Rng & Units 04/04/2021 04/03/2021 04/02/2021  WBC 4.0 - 10.5 K/uL 8.1 8.8 12.9(H)  Hemoglobin 13.0 - 17.0 g/dL 11.7(L) 13.4 12.8(L)  Hematocrit 39.0 - 52.0 % 37.2(L) 43.7 41.8  Platelets 150 - 400 K/uL 153 143(L) 128(L)   CMP Latest Ref Rng & Units 04/05/2021 04/04/2021 04/03/2021  Glucose 70 - 99 mg/dL  04/05/2021) 622(W) 72  BUN 6 - 20 mg/dL 979(G) 92(J) 19(E)  Creatinine 0.61 - 1.24 mg/dL 17(E) 0.81(K) 4.81(E)  Sodium 135 - 145 mmol/L 140 138 139  Potassium 3.5 - 5.1 mmol/L 5.7(H) 5.1 4.3  Chloride 98 - 111 mmol/L 108 106 103  CO2 22 - 32 mmol/L 25 26 25   Calcium 8.9 - 10.3 mg/dL 5.63(J) ) 4.9(F)  Total Protein 6.5 - 8.1 g/dL - 5.9(L) -  Total Bilirubin 0.3 - 1.2 mg/dL - 1.0 -  Alkaline Phos 38 - 126 U/L - 105 -  AST 15 - 41 U/L - 35 -  ALT 0 - 44 U/L - 14 -     Imaging studies: No new pertinent imaging studies   Assessment/Plan:  60 y.o. male with AKI otherwise doing well 2 Days Post-Op s/p robotic assisted laparoscopic cholecystectomy for acute cholecystitis, complicated by pertinent comorbidities including CAD, CHF with EF of 20-25%.   - Continue heart healthy diet   - Continue IVF resuscitation  - Continue IV Abx (Zosyn)  - Correct hyperkalemia; will get ECG; appreciate pharmacy assistance   - Monitor renal function; worsening today; will ask nephrology input; hold nephrotoxins   - Maintain surgical drain; monitor and record output; he did have ascites and cirrhosis seen intra-operatively so anticipate high output              - Pain control prn; antiemetics prn   - Discharge Planning; Not ready for discharge secondary to worsening renal function   All of the above findings and recommendations were discussed with the patient, and the  medical team, and all of patient's questions were answered to his expressed satisfaction.  -- Lynden Oxford, PA-C [facility] 04/05/2021, 7:59 AM [phone] M-F: 7am - 4pm

## 2021-04-05 NOTE — Consult Note (Signed)
[facility]  CONSULT NOTE    Date: 04/05/2021                  Patient Name:  Cameron [name]  MRN: [id number]  DOB: [birth year]  Age / Sex: 60 y.o., male         PCP: [place]                 Service Requesting Consult: [facility]                 Reason for Consult: Acute kidney injury            History of Present Illness: [name] is a 60 y.o.  male with medical history including CAD, and sCHF with EF 20-25%, , who was admitted to [facility] on 03/31/2021 for Cholecystitis [K81.9] Acute calculous cholecystitis [K80.00] Chest pain [R07.9]  Patient presets to the ED with complaints of abdominal pain that began on same day of ED presentation. He reported nausea, but no vomiting and diarrhea. Denies shortness of breath and cough. Pertinent labs on admission include potassium 2.9, Sodium 140, Creatinine 1.29, Troponin 30, and eGFR >60. CT abd/pelvis with contrast shows large solitary gallstone in neck of gallbladder resulting in gallbladder distention.General surgery consulted and performed on 04/03/21  We have been consulted to manage an acute kidney injury during this admission   Medications: Outpatient medications: Medications Prior to Admission  Medication Sig Dispense Refill Last Dose   acetaminophen (TYLENOL) 500 MG tablet Take 500 mg by mouth daily.   03/31/2021   amiodarone (PACERONE) 200 MG tablet Take 1 tablet by mouth 2 (two) times daily.   03/31/2021   apixaban (ELIQUIS) 5 MG TABS tablet Take 1 tablet by mouth 2 (two) times daily.   03/31/2021   atorvastatin (LIPITOR) 80 MG tablet Take 1 tablet (80 mg total) by mouth daily.   03/31/2021   furosemide (LASIX) 40 MG tablet Take 0.5 tablets (20 mg total) by mouth 2 (two) times daily. 60 tablet 1 03/31/2021   lisinopril (ZESTRIL) 2.5 MG tablet Take 1 tablet (2.5 mg total) by mouth daily. 30 tablet 0 03/31/2021   nitroGLYCERIN (NITROSTAT) 0.4 MG SL tablet Place under the tongue.   prn at prn    allopurinol (ZYLOPRIM) 100 MG tablet Take 1 tablet (100 mg total) by mouth daily. (Patient not taking: Reported on 04/01/2021) 30 tablet 11 Not Taking   apixaban (ELIQUIS) 5 MG TABS tablet Take 1 tablet (5 mg total) by mouth 2 (two) times daily. 60 tablet 0    ascorbic acid (VITAMIN C) 500 MG tablet Take 1 tablet (500 mg total) by mouth daily. (Patient not taking: Reported on 04/01/2021) 30 tablet 0 Not Taking   carvedilol (COREG) 3.125 MG tablet Take 1 tablet (3.125 mg total) by mouth 2 (two) times daily with a meal. (Patient not taking: Reported on 04/01/2021) 60 tablet 0 Not Taking   colchicine 0.6 MG tablet Take 1 tablet (0.6 mg total) by mouth daily as needed. 30 tablet 0    collagenase (SANTYL) ointment Apply topically daily. 15 g 0    feeding supplement (ENSURE ENLIVE / ENSURE PLUS) LIQD Take 237 mLs by mouth 2 (two) times daily between meals. 888 mL 12    folic acid (FOLVITE) 1 MG tablet Take 1 tablet (1 mg total) by mouth daily. (Patient not taking: Reported on 04/01/2021) 30 tablet 0 Not Taking   metolazone (ZAROXOLYN) 2.5 MG tablet Take 2.5 mg  by mouth 2 (two) times daily. (Patient not taking: No sig reported)   Not Taking   oxyCODONE-acetaminophen (PERCOCET/ROXICET) 5-325 MG tablet Take 1 tablet by mouth every 8 (eight) hours as needed for severe pain. (Patient not taking: No sig reported) 15 tablet 0 Not Taking   potassium chloride (KLOR-CON) 10 MEQ tablet Take 1 tablet (10 mEq total) by mouth daily. 30 tablet 0    vitamin B-12 1000 MCG tablet Take 1 tablet (1,000 mcg total) by mouth daily. (Patient not taking: Reported on 04/01/2021) 30 tablet 0 Not Taking    Current medications: Current Facility-Administered Medications  Medication Dose Route Frequency Provider Last Rate Last Admin   0.9 %  sodium chloride infusion   Intravenous Continuous Tylene Fantasia, PA-C 100 mL/hr at 04/05/21 1600 New Bag at 04/05/21 1600   albumin human 25 % solution 25 g  25 g Intravenous Once Pabon, [place] F,  MD       allopurinol (ZYLOPRIM) tablet 100 mg  100 mg Oral Daily Fredirick Maudlin, MD   100 mg at 04/05/21 7356   amiodarone (PACERONE) tablet 200 mg  200 mg Oral BID Fredirick Maudlin, MD   200 mg at 04/05/21 7014   apixaban (ELIQUIS) tablet 5 mg  5 mg Oral BID Fredirick Maudlin, MD   5 mg at 04/05/21 1030   atorvastatin (LIPITOR) tablet 80 mg  80 mg Oral Daily Fredirick Maudlin, MD   80 mg at 04/05/21 1314   carvedilol (COREG) tablet 3.125 mg  3.125 mg Oral BID WC Fredirick Maudlin, MD   3.125 mg at 04/05/21 3888   colchicine tablet 0.6 mg  0.6 mg Oral Daily PRN Fredirick Maudlin, MD       HYDROcodone-acetaminophen (NORCO/VICODIN) 5-325 MG per tablet 2 tablet  2 tablet Oral Q6H PRN Ronny Bacon, MD   2 tablet at 04/05/21 1600   nitroGLYCERIN (NITROSTAT) SL tablet 0.4 mg  0.4 mg Sublingual Q5 min PRN Fredirick Maudlin, MD       ondansetron (ZOFRAN-ODT) disintegrating tablet 4 mg  4 mg Oral Q6H PRN Fredirick Maudlin, MD       Or   ondansetron [facility]) injection 4 mg  4 mg Intravenous Q6H PRN Fredirick Maudlin, MD   4 mg at 03/31/21 2257   oxyCODONE (Oxy IR/ROXICODONE) immediate release tablet 10 mg  10 mg Oral Q4H PRN Ronny Bacon, MD   10 mg at 04/05/21 1218   piperacillin-tazobactam (ZOSYN) IVPB 3.375 g  3.375 g Intravenous Raynald Kemp, MD 12.5 mL/hr at 04/05/21 1556 3.375 g at 04/05/21 1556   polyethylene glycol (MIRALAX / GLYCOLAX) packet 17 g  17 g Oral Daily Tylene Fantasia, PA-C   17 g at 04/05/21 7579      Allergies: No Known Allergies    Past Medical History: Past Medical History:  Diagnosis Date   CHF (congestive heart failure) ([place])    Coronary artery disease    a. 12/17 PCI with DES to pLAD with resdiual disease (RX therapy)   Hyperlipidemia    Hypertension    PAD (peripheral artery disease) ([place])    STEMI (ST elevation myocardial infarction) ([place]) 2017     Past Surgical History: Past Surgical History:  Procedure Laterality Date   CARDIAC CATHETERIZATION  N/A 06/17/2016   Procedure: Left Heart Cath and Coronary Angiography;  Surgeon: Lorretta Harp, MD;  Location: [place] [facility];  Service: Cardiovascular;  Laterality: N/A;   CARDIAC CATHETERIZATION N/A 06/17/2016   Procedure: Coronary Stent Intervention;  Surgeon: Lorretta Harp, MD;  Location: [place] [facility];  Service: Cardiovascular;  Laterality: N/A;   cardiac stents     CERVICAL SPINE SURGERY     LOWER EXTREMITY ANGIOGRAPHY Left 06/25/2020   Procedure: LOWER EXTREMITY ANGIOGRAPHY;  Surgeon: Algernon Huxley, MD;  Location: [place] [facility];  Service: Cardiovascular;  Laterality: Left;     Family History: Family History  Problem Relation Age of Onset   CAD Paternal Grandfather    Breast cancer Mother    Parkinson's disease Father    Alzheimer's disease Father    Hypertension Father    Diabetes Father      Social History: Social History   Socioeconomic History   Marital status: Divorced    Spouse name: Not on file   Number of children: Not on file   Years of education: Not on file   Highest education level: Not on file  Occupational History   Not on file  Tobacco Use   Smoking status: Every Day    Packs/day: 1.00    Types: Cigarettes   Smokeless tobacco: Never  Vaping Use   Vaping Use: Never used  Substance and Sexual Activity   Alcohol use: Not Currently    Comment: weekends   Drug use: Yes    Frequency: 1.0 times per week    Types: Marijuana    Comment: twice monthly   Sexual activity: Not on file  Other Topics Concern   Not on file  Social History Narrative   Not on file   Social Determinants of Health   Financial Resource Strain: Not on file  Food Insecurity: Not on file  Transportation Needs: Not on file  Physical Activity: Not on file  Stress: Not on file  Social Connections: Not on file  Intimate Partner Violence: Not on file     Review of Systems: Review of Systems  Gastrointestinal:  Positive for nausea.  Musculoskeletal:   Positive for back pain.  All other systems reviewed and are negative.  Vital Signs: Blood pressure (!) 111/93, pulse 98, temperature 98.9 F (37.2 C), resp. rate 18, height _0  (1.702 m), weight 86.2 kg, SpO2 93 %.  Weight trends: Danley Danker Weights   03/31/21 1905 04/03/21 0700  Weight: 83.9 kg 86.2 kg    Physical Exam: General: NAD, laying in bed  Head: Normocephalic, atraumatic. Moist oral mucosal membranes  Eyes: Anicteric  Neck: Supple, trachea midline  Lungs:  Clear to auscultation, normal effort  Heart: Regular rate and rhythm  Abdomen:  Soft, tender, mild distention  Extremities:  trace peripheral edema.  Neurologic: Nonfocal, moving all four extremities  Skin: No lesions        Lab results: Basic Metabolic Panel: Recent Labs  Lab 04/01/21 0641 04/02/21 0419 04/03/21 0604 04/04/21 0406 04/05/21 0434  NA 142 138 139 138 140  K 3.7 4.6 4.3 5.1 5.7*  CL 102 105 103 106 108  CO2 _1 GLUCOSE 114* 82 72 130* 116*  BUN 16 23* 29* 38* 46*  CREATININE 1.15 1.50* 1.57* 1.75* 2.14*  CALCIUM 8.6* 8.0* 8.8* 8.2* 8.4*  MG 1.6* 1.9  --  2.1  --   PHOS 4.5  --   --  5.3*  --     Liver Function Tests: Recent Labs  Lab 04/01/21 0641 04/02/21 0419 04/04/21 0406  AST 16 15 35  ALT _2 ALKPHOS 125 116 105  BILITOT 1.6* 2.1* 1.0  PROT 6.7  6.0* 5.9*  ALBUMIN 3.6 3.1* 3.0*   Recent Labs  Lab 03/31/21 1904  LIPASE 30   No results for input(s): AMMONIA in the last 168 hours.  CBC: Recent Labs  Lab 03/31/21 1904 04/01/21 0641 04/02/21 0419 04/03/21 0604 04/04/21 0406  WBC 10.1 10.5 12.9* 8.8 8.1  HGB 14.8 15.0 12.8* 13.4 11.7*  HCT 45.8 48.8 41.8 43.7 37.2*  MCV 100.7* 101.5* 103.5* 102.6* 103.3*  PLT 164 146* 128* 143* 153    Cardiac Enzymes: No results for input(s): CKTOTAL, CKMB, CKMBINDEX, TROPONINI in the last 168 hours.  BNP: Invalid input(s): POCBNP  CBG: No results for input(s): GLUCAP in the last 168  hours.  Microbiology: Results for orders placed or performed during the hospital encounter of 03/31/21  Resp Panel by RT-PCR (Flu A&B, Covid) Nasopharyngeal Swab     Status: None   Collection Time: 03/31/21  9:22 PM   Specimen: Nasopharyngeal Swab; Nasopharyngeal(NP) swabs in vial transport medium  Result Value Ref Range Status   SARS Coronavirus 2 by RT PCR NEGATIVE NEGATIVE Final    Comment: (NOTE) SARS-CoV-2 target nucleic acids are NOT DETECTED.  The SARS-CoV-2 RNA is generally detectable in upper respiratory specimens during the acute phase of infection. The lowest concentration of SARS-CoV-2 viral copies this assay can detect is 138 copies/mL. A negative result does not preclude SARS-Cov-2 infection and should not be used as the sole basis for treatment or other patient management decisions. A negative result may occur with  improper specimen collection/handling, submission of specimen other than nasopharyngeal swab, presence of viral mutation(s) within the areas targeted by this assay, and inadequate number of viral copies(<138 copies/mL). A negative result must be combined with clinical observations, patient history, and epidemiological information. The expected result is Negative.  Fact Sheet for Patients:  EntrepreneurPulse.com.au  Fact Sheet for Healthcare Providers:  IncredibleEmployment.be  This test is no t yet approved or cleared by the [place] FDA and  has been authorized for detection and/or diagnosis of SARS-CoV-2 by FDA under an Emergency Use Authorization (EUA). This EUA will remain  in effect (meaning this test can be used) for the duration of the COVID-19 declaration under Section 564(b)(1) of the Act, 21 U.S.C.section 360bbb-3(b)(1), unless the authorization is terminated  or revoked sooner.       Influenza A by PCR NEGATIVE NEGATIVE Final   Influenza B by PCR NEGATIVE NEGATIVE Final    Comment: (NOTE) The  Xpert Xpress SARS-CoV-2/FLU/RSV plus assay is intended as an aid in the diagnosis of influenza from Nasopharyngeal swab specimens and should not be used as a sole basis for treatment. Nasal washings and aspirates are unacceptable for Xpert Xpress SARS-CoV-2/FLU/RSV testing.  Fact Sheet for Patients: EntrepreneurPulse.com.au  Fact Sheet for Healthcare Providers: IncredibleEmployment.be  This test is not yet approved or cleared by the [place] FDA and has been authorized for detection and/or diagnosis of SARS-CoV-2 by FDA under an Emergency Use Authorization (EUA). This EUA will remain in effect (meaning this test can be used) for the duration of the COVID-19 declaration under Section 564(b)(1) of the Act, 21 U.S.C. section 360bbb-3(b)(1), unless the authorization is terminated or revoked.  Performed at [facility], [address] [place], [place] [address]     Coagulation Studies: No results for input(s): LABPROT, INR in the last 72 hours.  Urinalysis: No results for input(s): COLORURINE, LABSPEC, PHURINE, GLUCOSEU, HGBUR, BILIRUBINUR, KETONESUR, PROTEINUR, UROBILINOGEN, NITRITE, LEUKOCYTESUR in the last 72 hours.  Invalid input(s): APPERANCEUR  Imaging: No results found.   Assessment & Plan: Mr. ALANDO COLLERAN is a 60 y.o.  male with medical history including CAD, and sCHF with EF 20-25%, , who was admitted to [facility] on 03/31/2021 for Cholecystitis [K81.9] Acute calculous cholecystitis [K80.00] Chest pain [R07.9]  Acute Kidney Injury with baseline creatinine 1.29 and GFR of >60 on 03/31/21.  Acute kidney injury secondary to IV contrast nephropathy and mild hypotension. IV contrast exposure on 03/31/21, no acute indication for dialysis at this time. Continue IVF until oral nutrition improves. Will continue to monitor. Avoid nephrotoxic agents and therapies, if possible.   2.  Acute cholecystitis s/p laparoscopic  cholecystectomy. Surgery following, continue antibiotics   LOS: 5 Zenna Traister 9/30/20224:50 PM

## 2021-04-05 NOTE — TOC Progression Note (Addendum)
Transition of Care [facility]) - Progression Note    Patient Details  Name: Cameron [name] MRN: [id number] Date of Birth: October 29, 1960  [facility] CM/SW Contact  Liliana Cline, LCSW Phone Number: 04/05/2021, 9:35 AM  Clinical Narrative:   PCP appointment made for [facility] on 04/15/21 at 10 am. Provider Grayling Congress, NP. Added into Epic so it will be included on DC summary.   11:45- Updated patient, he refuses, says he would rather go to the walk in clinic. Attempted call to [facility] to cancel. Unable to reach. Will call again later.   1:15- Cancelled appointment at [facility] per patient request.     Expected Discharge Plan: Home/Self Care Barriers to Discharge: Continued Medical Work up  Expected Discharge Plan and Services Expected Discharge Plan: Home/Self Care       Living arrangements for the past 2 months: Single Family Home                                       Social Determinants of Health (SDOH) Interventions    Readmission Risk Interventions Readmission Risk Prevention Plan 04/04/2021  Transportation Screening Complete  PCP or Specialist Appt within 3-5 Days Complete  Social Work Consult for Recovery Care Planning/Counseling Complete  Palliative Care Screening Not Applicable  Medication Review [employer]) Complete  Some recent data might be hidden

## 2021-04-06 ENCOUNTER — Inpatient Hospital Stay: Payer: Commercial Managed Care - PPO

## 2021-04-06 LAB — COMPREHENSIVE METABOLIC PANEL
ALT: 73 U/L — ABNORMAL HIGH (ref 0–44)
AST: 205 U/L — ABNORMAL HIGH (ref 15–41)
Albumin: 3.4 g/dL — ABNORMAL LOW (ref 3.5–5.0)
Alkaline Phosphatase: 119 U/L (ref 38–126)
Anion gap: 6 (ref 5–15)
BUN: 47 mg/dL — ABNORMAL HIGH (ref 6–20)
CO2: 24 mmol/L (ref 22–32)
Calcium: 8.5 mg/dL — ABNORMAL LOW (ref 8.9–10.3)
Chloride: 106 mmol/L (ref 98–111)
Creatinine, Ser: 2.09 mg/dL — ABNORMAL HIGH (ref 0.61–1.24)
GFR, Estimated: 36 mL/min — ABNORMAL LOW (ref >60)
Glucose, Bld: 90 mg/dL (ref 70–99)
Potassium: 5.1 mmol/L (ref 3.5–5.1)
Sodium: 136 mmol/L (ref 135–145)
Total Bilirubin: 0.9 mg/dL (ref 0.3–1.2)
Total Protein: 6.3 g/dL — ABNORMAL LOW (ref 6.5–8.1)

## 2021-04-06 MED ORDER — ALBUMIN HUMAN 25 % IV SOLN
25.0000 g | Freq: Four times a day (QID) | INTRAVENOUS | Status: AC
Start: 2021-04-06 — End: 2021-04-07
  Administered 2021-04-06 – 2021-04-07 (×4): 25 g via INTRAVENOUS
  Filled 2021-04-06 (×4): qty 100

## 2021-04-06 MED ORDER — FUROSEMIDE 20 MG PO TABS
20.0000 mg | ORAL_TABLET | Freq: Two times a day (BID) | ORAL | Status: DC
  Administered 2021-04-06 – 2021-04-10 (×7): 20 mg via ORAL
  Filled 2021-04-06 (×7): qty 1

## 2021-04-06 MED ORDER — SPIRONOLACTONE 25 MG PO TABS
25.0000 mg | ORAL_TABLET | Freq: Two times a day (BID) | ORAL | Status: DC
  Administered 2021-04-06 – 2021-04-08 (×6): 25 mg via ORAL
  Filled 2021-04-06 (×7): qty 1

## 2021-04-06 MED ORDER — IOHEXOL 9 MG/ML PO SOLN
500.0000 mL | ORAL | Status: AC
  Administered 2021-04-06: 500 mL via ORAL

## 2021-04-06 NOTE — Progress Notes (Signed)
POD #3 Persistent leak of ascites LFT up c/w cirrhosis AVSS Stable creat Feeling ok, taking po  PE NAD Abd: ascites, clear fluid from drain incisions c/d/I  A/p CT scan reviewed small collection likely postop, given can not completely exclude leak we will do HIDA.  We will manage as decompensated cirrhotic w spironolactone, lasix and albumin Trend labs daily No need for surgical intervention

## 2021-04-06 NOTE — Progress Notes (Addendum)
Cameron [name]  MRN: [id number]  DOB/AGE: [birth year] 59 y.o.  Primary Care Physician:[facility]  Admit date: 03/31/2021  Chief Complaint:  Chief Complaint  Patient presents with   Abdominal Pain    S-Pt presented on  03/31/2021 with  Chief Complaint  Patient presents with   Abdominal Pain  . Patient was seen today on the second floor. Patient main complaint in today visit was that he was feeling better than before now that his gall bladder has been removed.  Medications    allopurinol  100 mg Oral Daily   amiodarone  200 mg Oral BID   apixaban  5 mg Oral BID   atorvastatin  80 mg Oral Daily   carvedilol  3.125 mg Oral BID WC   COVID-19 mRNA Vac-TriS (Pfizer)  0.3 mL Intramuscular ONCE-1600   furosemide  20 mg Oral BID   influenza vaccine adjuvanted  0.5 mL Intramuscular Tomorrow-1000   iohexol  500 mL Oral Q1H   polyethylene glycol  17 g Oral Daily   spironolactone  25 mg Oral BID         QHU:TMLYY from the symptoms mentioned above,there are no other symptoms referable to all systems reviewed.  Physical Exam: Vital signs in last 24 hours: Temp:  [97.5 F (36.4 C)-98.9 F (37.2 C)] 97.6 F (36.4 C) (10/01 0809) Pulse Rate:  [94-98] 94 (10/01 0809) Resp:  [16-18] 18 (10/01 0809) BP: (96-111)/(63-93) 101/80 (10/01 0809) SpO2:  [84 %-95 %] 93 % (10/01 0809) Weight change:     Intake/Output from previous day: 09/30 0701 - 10/01 0700 In: 2714.9 [P.O.:240; I.V.:2233.1; IV Piggyback:241.8] Out: 695 [Urine:150; Drains:545] Total I/O In: 120 [P.O.:120] Out: 300 [Urine:300]   Physical Exam:  General: NAD, laying in bed  Head: Normocephalic, atraumatic. Moist oral mucosal membranes  Eyes: Anicteric  Neck: Supple, trachea midline  Lungs:  Clear to auscultation, normal effort  Heart: Regular rate and rhythm  Abdomen:  Soft, tender, mild distention, JP drain in situ   Extremities:  trace peripheral edema.  Neurologic: Nonfocal, moving all four  extremities  Skin: No lesions    Lab Results:  CBC  Recent Labs    04/04/21 0406  WBC 8.1  HGB 11.7*  HCT 37.2*  PLT 153    BMET  Recent Labs    04/05/21 0434 04/06/21 0528  NA 140 136  K 5.7* 5.1  CL 108 106  CO2 25 24  GLUCOSE 116* 90  BUN 46* 47*  CREATININE 2.14* 2.09*  CALCIUM 8.4* 8.5*      Most recent Creatinine trend  Lab Results  Component Value Date   CREATININE 2.09 (H) 04/06/2021   CREATININE 2.14 (H) 04/05/2021   CREATININE 1.75 (H) 04/04/2021      MICRO   Recent Results (from the past 240 hour(s))  Resp Panel by RT-PCR (Flu A&B, Covid) Nasopharyngeal Swab     Status: None   Collection Time: 03/31/21  9:22 PM   Specimen: Nasopharyngeal Swab; Nasopharyngeal(NP) swabs in vial transport medium  Result Value Ref Range Status   SARS Coronavirus 2 by RT PCR NEGATIVE NEGATIVE Final    Comment: (NOTE) SARS-CoV-2 target nucleic acids are NOT DETECTED.  The SARS-CoV-2 RNA is generally detectable in upper respiratory specimens during the acute phase of infection. The lowest concentration of SARS-CoV-2 viral copies this assay can detect is 138 copies/mL. A negative result does not preclude SARS-Cov-2 infection and should not be used as the sole basis for treatment or other  patient management decisions. A negative result may occur with  improper specimen collection/handling, submission of specimen other than nasopharyngeal swab, presence of viral mutation(s) within the areas targeted by this assay, and inadequate number of viral copies(<138 copies/mL). A negative result must be combined with clinical observations, patient history, and epidemiological information. The expected result is Negative.  Fact Sheet for Patients:  BloggerCourse.com  Fact Sheet for Healthcare Providers:  SeriousBroker.it  This test is no t yet approved or cleared by the [place] FDA and  has been authorized for  detection and/or diagnosis of SARS-CoV-2 by FDA under an Emergency Use Authorization (EUA). This EUA will remain  in effect (meaning this test can be used) for the duration of the COVID-19 declaration under Section 564(b)(1) of the Act, 21 U.S.C.section 360bbb-3(b)(1), unless the authorization is terminated  or revoked sooner.       Influenza A by PCR NEGATIVE NEGATIVE Final   Influenza B by PCR NEGATIVE NEGATIVE Final    Comment: (NOTE) The Xpert Xpress SARS-CoV-2/FLU/RSV plus assay is intended as an aid in the diagnosis of influenza from Nasopharyngeal swab specimens and should not be used as a sole basis for treatment. Nasal washings and aspirates are unacceptable for Xpert Xpress SARS-CoV-2/FLU/RSV testing.  Fact Sheet for Patients: BloggerCourse.com  Fact Sheet for Healthcare Providers: SeriousBroker.it  This test is not yet approved or cleared by the [place] FDA and has been authorized for detection and/or diagnosis of SARS-CoV-2 by FDA under an Emergency Use Authorization (EUA). This EUA will remain in effect (meaning this test can be used) for the duration of the COVID-19 declaration under Section 564(b)(1) of the Act, 21 U.S.C. section 360bbb-3(b)(1), unless the authorization is terminated or revoked.  Performed at [facility], [address] [place], [place] [address]          Impression:  [name] is a 60 y.o.  male with medical history including CAD, and sCHF with EF 20-25%, , who was admitted to [facility] on 03/31/2021 for Cholecystitis [K81.9] Acute calculous cholecystitis [K80.00] Chest pain [R07.9]     1)Renal    Acute kidney injury Patient has AKI secondary to multiple reasons Patient did receive IV contrast Patient was hypotensive Patient does have a history of CHF Patient has propensity for acute kidney injury Patient has had AKI in 2015 with a peak creatinine of  2.0,    - then again in 2021 with a peak creatinine of 1.5    - then again in February 2022 with a peak creatinine of 2.3  Patient creatinine is now at around 2.0    2)HTN  Patient blood pressure on the lower side but patient asymptomatic   3)Anemia of chronic disease  CBC Latest Ref Rng & Units 04/04/2021 04/03/2021 04/02/2021  WBC 4.0 - 10.5 K/uL 8.1 8.8 12.9(H)  Hemoglobin 13.0 - 17.0 g/dL 11.7(L) 13.4 12.8(L)  Hematocrit 39.0 - 52.0 % 37.2(L) 43.7 41.8  Platelets 150 - 400 K/uL 153 143(L) 128(L)       HGb at goal (9--11)   4) Secondary hyperparathyroidism -CKD Mineral-Bone Disorder    Lab Results  Component Value Date   CALCIUM 8.5 (L) 04/06/2021   PHOS 5.3 (H) 04/04/2021    Secondary Hyperparathyroidism present Phosphorus at the goal for acute state No need for binders for now   5)Acute cholecystitis Patient is s/p laparoscopic cholecystectomy Surgery is following  6) Electrolytes   BMP Latest Ref Rng & Units 04/06/2021 04/05/2021 04/04/2021  Glucose  70 - 99 mg/dL 90 944(H) 675(F)  BUN 6 - 20 mg/dL 16(B) 84(Y) 65(L)  Creatinine 0.61 - 1.24 mg/dL 9.35(T) 0.17(B) 9.39(Q)  Sodium 135 - 145 mmol/L 136 140 138  Potassium 3.5 - 5.1 mmol/L 5.1 5.7(H) 5.1  Chloride 98 - 111 mmol/L 106 108 106  CO2 22 - 32 mmol/L 24 25 26   Calcium 8.9 - 10.3 mg/dL ) 3.0(S) 9.2(Z)     Sodium Normonatremic   Potassium Hyperkalemia Now better    7)Acid base    Co2 at goal     Plan:   Patient creatinine is stable Patient potassium is better We will continue the current IV fluids      Lisanne Ponce s Rosalita Carey 04/06/2021, 1:14 PM

## 2021-04-07 ENCOUNTER — Inpatient Hospital Stay: Payer: Commercial Managed Care - PPO

## 2021-04-07 LAB — CBC
HCT: 40.8 % (ref 39.0–52.0)
Hemoglobin: 12.6 g/dL — ABNORMAL LOW (ref 13.0–17.0)
MCH: 31.8 pg (ref 26.0–34.0)
MCHC: 30.9 g/dL (ref 30.0–36.0)
MCV: 103 fL — ABNORMAL HIGH (ref 80.0–100.0)
Platelets: 162 10*3/uL (ref 150–400)
RBC: 3.96 MIL/uL — ABNORMAL LOW (ref 4.22–5.81)
RDW: 16.7 % — ABNORMAL HIGH (ref 11.5–15.5)
WBC: 9.7 10*3/uL (ref 4.0–10.5)
nRBC: 1.1 % — ABNORMAL HIGH (ref 0.0–0.2)

## 2021-04-07 LAB — COMPREHENSIVE METABOLIC PANEL
ALT: 68 U/L — ABNORMAL HIGH (ref 0–44)
AST: 109 U/L — ABNORMAL HIGH (ref 15–41)
Albumin: 3.6 g/dL (ref 3.5–5.0)
Alkaline Phosphatase: 126 U/L (ref 38–126)
Anion gap: 10 (ref 5–15)
BUN: 44 mg/dL — ABNORMAL HIGH (ref 6–20)
CO2: 22 mmol/L (ref 22–32)
Calcium: 8.7 mg/dL — ABNORMAL LOW (ref 8.9–10.3)
Chloride: 106 mmol/L (ref 98–111)
Creatinine, Ser: 1.91 mg/dL — ABNORMAL HIGH (ref 0.61–1.24)
GFR, Estimated: 40 mL/min — ABNORMAL LOW (ref >60)
Glucose, Bld: 89 mg/dL (ref 70–99)
Potassium: 4.9 mmol/L (ref 3.5–5.1)
Sodium: 138 mmol/L (ref 135–145)
Total Bilirubin: 1.2 mg/dL (ref 0.3–1.2)
Total Protein: 5.9 g/dL — ABNORMAL LOW (ref 6.5–8.1)

## 2021-04-07 MED ORDER — TECHNETIUM TC 99M MEBROFENIN IV KIT
5.0000 | PACK | Freq: Once | INTRAVENOUS | Status: AC
  Administered 2021-04-07: 5.26 via INTRAVENOUS

## 2021-04-07 NOTE — Progress Notes (Signed)
POD #4 Persistent leak of ascites AVSS Stable creat Feeling ok, taking po HIDA no leak   PE NAD Abd: ascites, clear fluid from drain incisions c/d/I   A/p  We will manage as decompensated cirrhotic w spironolactone, lasix and albumin Trend labs daily No evidence of bile leak No need for surgical intervention

## 2021-04-07 NOTE — Plan of Care (Signed)

## 2021-04-07 NOTE — Progress Notes (Signed)
Cameron [name]  MRN: [id number]  DOB/AGE: 1961/02/11 60 y.o.  Primary Care Physician:[facility]  Admit date: 03/31/2021  Chief Complaint:  Chief Complaint  Patient presents with   Abdominal Pain    S-Pt presented on  03/31/2021 with  Chief Complaint  Patient presents with   Abdominal Pain  . Patient was seen today on the second floor. Patient main concern today visit was he was putting a lot through his drain  Medications    allopurinol  100 mg Oral Daily   amiodarone  200 mg Oral BID   apixaban  5 mg Oral BID   atorvastatin  80 mg Oral Daily   carvedilol  3.125 mg Oral BID WC   furosemide  20 mg Oral BID   influenza vaccine adjuvanted  0.5 mL Intramuscular Tomorrow-1000   polyethylene glycol  17 g Oral Daily   spironolactone  25 mg Oral BID         MPN:TIRWE from the symptoms mentioned above,there are no other symptoms referable to all systems reviewed.  Physical Exam: Vital signs in last 24 hours: Temp:  [97.6 F (36.4 C)-98.7 F (37.1 C)] 98.7 F (37.1 C) (10/02 1540) Pulse Rate:  [90-99] 90 (10/02 1540) Resp:  [16-20] 18 (10/02 1540) BP: (93-103)/(66-73) 103/70 (10/02 1540) SpO2:  [78 %-95 %] 92 % (10/02 1540) Weight change:     Intake/Output from previous day: 10/01 0701 - 10/02 0700 In: 1377.9 [P.O.:120; I.V.:938.2; IV Piggyback:319.7] Out: 1860 [Urine:750; Drains:1110] No intake/output data recorded.   Physical Exam:  General: NAD, laying in bed  Head: Normocephalic, atraumatic. Moist oral mucosal membranes  Eyes: Anicteric  Neck: Supple, trachea midline  Lungs:  Clear to auscultation, normal effort  Heart: Regular rate and rhythm  Abdomen:  Soft, tender, mild distention, JP drain in situ   Extremities:  trace peripheral edema.  Neurologic: Nonfocal, moving all four extremities  Skin: No lesions    Lab Results:  CBC  Recent Labs    04/07/21 0513  WBC 9.7  HGB 12.6*  HCT 40.8  PLT 162    BMET  Recent Labs     04/06/21 0528 04/07/21 0513  NA 136 138  K 5.1 4.9  CL 106 106  CO2 24 22  GLUCOSE 90 89  BUN 47* 44*  CREATININE 2.09* 1.91*  CALCIUM 8.5* 8.7*      Most recent Creatinine trend  Lab Results  Component Value Date   CREATININE 1.91 (H) 04/07/2021   CREATININE 2.09 (H) 04/06/2021   CREATININE 2.14 (H) 04/05/2021      MICRO   Recent Results (from the past 240 hour(s))  Resp Panel by RT-PCR (Flu A&B, Covid) Nasopharyngeal Swab     Status: None   Collection Time: 03/31/21  9:22 PM   Specimen: Nasopharyngeal Swab; Nasopharyngeal(NP) swabs in vial transport medium  Result Value Ref Range Status   SARS Coronavirus 2 by RT PCR NEGATIVE NEGATIVE Final    Comment: (NOTE) SARS-CoV-2 target nucleic acids are NOT DETECTED.  The SARS-CoV-2 RNA is generally detectable in upper respiratory specimens during the acute phase of infection. The lowest concentration of SARS-CoV-2 viral copies this assay can detect is 138 copies/mL. A negative result does not preclude SARS-Cov-2 infection and should not be used as the sole basis for treatment or other patient management decisions. A negative result may occur with  improper specimen collection/handling, submission of specimen other than nasopharyngeal swab, presence of viral mutation(s) within the areas targeted by this assay,  and inadequate number of viral copies(<138 copies/mL). A negative result must be combined with clinical observations, patient history, and epidemiological information. The expected result is Negative.  Fact Sheet for Patients:  BloggerCourse.com  Fact Sheet for Healthcare Providers:  SeriousBroker.it  This test is no t yet approved or cleared by the [place] FDA and  has been authorized for detection and/or diagnosis of SARS-CoV-2 by FDA under an Emergency Use Authorization (EUA). This EUA will remain  in effect (meaning this test can be used) for the  duration of the COVID-19 declaration under Section 564(b)(1) of the Act, 21 U.S.C.section 360bbb-3(b)(1), unless the authorization is terminated  or revoked sooner.       Influenza A by PCR NEGATIVE NEGATIVE Final   Influenza B by PCR NEGATIVE NEGATIVE Final    Comment: (NOTE) The Xpert Xpress SARS-CoV-2/FLU/RSV plus assay is intended as an aid in the diagnosis of influenza from Nasopharyngeal swab specimens and should not be used as a sole basis for treatment. Nasal washings and aspirates are unacceptable for Xpert Xpress SARS-CoV-2/FLU/RSV testing.  Fact Sheet for Patients: BloggerCourse.com  Fact Sheet for Healthcare Providers: SeriousBroker.it  This test is not yet approved or cleared by the [place] FDA and has been authorized for detection and/or diagnosis of SARS-CoV-2 by FDA under an Emergency Use Authorization (EUA). This EUA will remain in effect (meaning this test can be used) for the duration of the COVID-19 declaration under Section 564(b)(1) of the Act, 21 U.S.C. section 360bbb-3(b)(1), unless the authorization is terminated or revoked.  Performed at [facility], [address] [place], [place] [address]          Impression:  Cameron [name] is a 60 y.o.  male with medical history including CAD, and sCHF with EF 20-25%, , who was admitted to [facility] on 03/31/2021 for Cholecystitis [K81.9] Acute calculous cholecystitis [K80.00] Chest pain [R07.9]     1)Renal    Acute kidney injury Patient has AKI secondary to multiple reasons Patient did receive IV contrast Patient was hypotensive Patient does have a history of CHF Patient has propensity for acute kidney injury Patient has had AKI in 2015 with a peak creatinine of 2.0,    - then again in 2021 with a peak creatinine of 1.5    - then again in February 2022 with a peak creatinine of 2.3  Patient creatinine is now at around  1.9    2)HTN  Patient blood pressure on the lower side but patient asymptomatic   3)Anemia of chronic disease  CBC Latest Ref Rng & Units 04/07/2021 04/04/2021 04/03/2021  WBC 4.0 - 10.5 K/uL 9.7 8.1 8.8  Hemoglobin 13.0 - 17.0 g/dL 12.6(L) 11.7(L) 13.4  Hematocrit 39.0 - 52.0 % 40.8 37.2(L) 43.7  Platelets 150 - 400 K/uL 162 153 143(L)       HGb at goal (9--11)   4) Secondary hyperparathyroidism -CKD Mineral-Bone Disorder    Lab Results  Component Value Date   CALCIUM 8.7 (L) 04/07/2021   PHOS 5.3 (H) 04/04/2021    Secondary Hyperparathyroidism present Phosphorus at the goal for acute state No need for binders for now   5)Acute cholecystitis Patient is s/p laparoscopic cholecystectomy Surgery is following  6) Electrolytes   BMP Latest Ref Rng & Units 04/07/2021 04/06/2021 04/05/2021  Glucose 70 - 99 mg/dL 89 90 419(F)  BUN 6 - 20 mg/dL 79(K) 24(O) 97(D)  Creatinine 0.61 - 1.24 mg/dL 5.32(D) 9.24(Q) 6.83(M)  Sodium 135 - 145 mmol/L  138 136 140  Potassium 3.5 - 5.1 mmol/L 4.9 5.1 5.7(H)  Chloride 98 - 111 mmol/L 106 106 108  CO2 22 - 32 mmol/L 22 24 25   Calcium 8.9 - 10.3 mg/dL ) 1.8(E) 9.9(B)     Sodium Normonatremic   Potassium Hyperkalemia Now better    7)Acid base  Co2 at goal     Plan:   Patient creatinine is stable Patient potassium is better We will continue the current treatment      Yatziri Wainwright s Madellyn Denio 04/07/2021, 7:07 PM

## 2021-04-08 LAB — COMPREHENSIVE METABOLIC PANEL
ALT: 43 U/L (ref 0–44)
AST: 38 U/L (ref 15–41)
Albumin: 3.6 g/dL (ref 3.5–5.0)
Alkaline Phosphatase: 95 U/L (ref 38–126)
Anion gap: 7 (ref 5–15)
BUN: 41 mg/dL — ABNORMAL HIGH (ref 6–20)
CO2: 25 mmol/L (ref 22–32)
Calcium: 8.5 mg/dL — ABNORMAL LOW (ref 8.9–10.3)
Chloride: 106 mmol/L (ref 98–111)
Creatinine, Ser: 1.72 mg/dL — ABNORMAL HIGH (ref 0.61–1.24)
GFR, Estimated: 45 mL/min — ABNORMAL LOW (ref >60)
Glucose, Bld: 91 mg/dL (ref 70–99)
Potassium: 4.4 mmol/L (ref 3.5–5.1)
Sodium: 138 mmol/L (ref 135–145)
Total Bilirubin: 1.1 mg/dL (ref 0.3–1.2)
Total Protein: 5.9 g/dL — ABNORMAL LOW (ref 6.5–8.1)

## 2021-04-08 LAB — CBC
HCT: 41.7 % (ref 39.0–52.0)
Hemoglobin: 12.7 g/dL — ABNORMAL LOW (ref 13.0–17.0)
MCH: 31.5 pg (ref 26.0–34.0)
MCHC: 30.5 g/dL (ref 30.0–36.0)
MCV: 103.5 fL — ABNORMAL HIGH (ref 80.0–100.0)
Platelets: 214 10*3/uL (ref 150–400)
RBC: 4.03 MIL/uL — ABNORMAL LOW (ref 4.22–5.81)
RDW: 16.9 % — ABNORMAL HIGH (ref 11.5–15.5)
WBC: 11.3 10*3/uL — ABNORMAL HIGH (ref 4.0–10.5)
nRBC: 1 % — ABNORMAL HIGH (ref 0.0–0.2)

## 2021-04-08 MED ORDER — NYSTATIN 100000 UNIT/GM EX CREA
TOPICAL_CREAM | Freq: Two times a day (BID) | CUTANEOUS | Status: DC
  Administered 2021-04-08: 1 via TOPICAL
  Filled 2021-04-08: qty 15

## 2021-04-08 NOTE — Progress Notes (Signed)
SATURATION QUALIFICATIONS: (This note is used to comply with regulatory documentation for home oxygen)  Patient Saturations on Room Air at Rest = 88%  Patient Saturations on 2 Liters of oxygen while Ambulating = 94%  Please briefly explain why patient needs home oxygen: patient requiring 2L of oxygen to maintain O2 saturations at acceptable level at this time.

## 2021-04-08 NOTE — Progress Notes (Addendum)
Patient continues to have persistent leak from JP drain. Nurse has emptied x3 since start of shift with leakage from around drain saturating bed linens, dressing around drain changed x2 due to saturation 4 split gauze, topped with ABD . Drainage is a light orange color. Patient not complaining of any SOB and states that when he has pain it is sharp stabbing and it is unchanged from previous pain since procedure. Nurse has paged Dr. Everlene Farrier on-call for surgery just to assure that amount of drainage is not something for concern. HIDA scan came back revealing no bile leak. Will continue to monitor.

## 2021-04-08 NOTE — Evaluation (Signed)
Physical Therapy Evaluation Patient Details Name: Cameron [name] MRN: [id number] DOB: Oct 22, 1960 Today's Date: 04/08/2021  History of Present Illness  Pt is a 60 y.o. male presenting to hospital 9/25 with abdominal pain.  S/p laparoscopic cholecystectomy with interaoperative ICG cholangiography 9/28.  Drain removed 04/08/21.  PMH includes CHF, CAD, HLD, htn, PAD, STEMI, L foot cellulitis, a-fib, and c-spine sx.  Clinical Impression  Prior to hospital admission, pt was independent with ambulation; lives alone in 1 level home with 5 STE with railing.  Pt's O2 sats 83-84% on room air upon PT arrival so pt placed on 2 L O2 via nasal cannula (nurse notified) and pt's O2 sats 92% or greater rest of therapy session.  Currently pt is CGA with transfers and ambulation 15 feet with RW.  Ambulation limited d/t lightheadedness (BP 102/76 post ambulation).  After pt sat down in recliner (post ambulation) increased drainage noted L LQ and suddenly drainage started to spray out of LLQ lateral lower trunk area (through mesh underwear); able to stop spray with pt leaning towards R side in chair but when pt shifted back (towards L side) drainage started to spray out of same area again but stopped once pt leaned back towards R side.  Nurse notified immediately and came to assess and reinforced dressings and nurse notified surgery regarding above drainage concerns.  Pt would benefit from skilled PT to address noted impairments and functional limitations (see below for any additional details).  Upon hospital discharge, pt would benefit from HHPT and support from family/friends.    Recommendations for follow up therapy are one component of a multi-disciplinary discharge planning process, led by the attending physician.  Recommendations may be updated based on patient status, additional functional criteria and insurance authorization.  Follow Up Recommendations Home health PT (Intermittent assist)    Equipment  Recommendations  Rolling walker with 5" wheels;3in1 (PT)    Recommendations for Other Services OT consult     Precautions / Restrictions Precautions Precautions: Fall Restrictions Weight Bearing Restrictions: No      Mobility  Bed Mobility               General bed mobility comments: Deferred (pt in recliner beginning/end of session)    Transfers Overall transfer level: Needs assistance Equipment used: Rolling walker (2 wheeled) Transfers: Sit to/from Stand Sit to Stand: Min guard         General transfer comment: mild increased effort to stand from recliner  Ambulation/Gait Ambulation/Gait assistance: Min guard Gait Distance (Feet): 15 Feet Assistive device: Rolling walker (2 wheeled)   Gait velocity: decreased   General Gait Details: L>R LE external rotation; decreased B LE step length/foot clearance  Stairs            Wheelchair Mobility    Modified Rankin (Stroke Patients Only)       Balance Overall balance assessment: Needs assistance Sitting-balance support: No upper extremity supported;Feet supported Sitting balance-Leahy Scale: Good Sitting balance - Comments: steady sitting reaching within BOS   Standing balance support: Single extremity supported Standing balance-Leahy Scale: Fair Standing balance comment: steady standing with at least single UE support                             Pertinent Vitals/Pain Pain Assessment: No/denies pain HR stable and WFL throughout treatment session.    Home Living Family/patient expects to be discharged to:: Private residence Living Arrangements: Alone Available Help at Discharge: Family;Friend(s);Available  PRN/intermittently Type of Home: House Home Access: Stairs to enter Entrance Stairs-Rails:  (one rail in middle of stairs) Entrance Stairs-Number of Steps: 5 Home Layout: One level Home Equipment: Walker - 2 wheels;Crutches;Walker - 4 wheels;Bedside commode      Prior  Function Level of Independence: Independent         Comments: H/o near falls (able to catch self).     Hand Dominance        Extremity/Trunk Assessment   Upper Extremity Assessment Upper Extremity Assessment: Generalized weakness    Lower Extremity Assessment Lower Extremity Assessment: Generalized weakness       Communication   Communication: No difficulties  Cognition Arousal/Alertness: Awake/alert Behavior During Therapy: WFL for tasks assessed/performed Overall Cognitive Status: Within Functional Limits for tasks assessed                                        General Comments General comments (skin integrity, edema, etc.): Increased drainage noted L LQ post ambulation: nurse notified and came to assess.  Nursing cleared pt for participation in physical therapy.  Pt agreeable to PT session.  Pt's son present during session.    Exercises     Assessment/Plan    PT Assessment Patient needs continued PT services  PT Problem List Decreased strength;Decreased activity tolerance;Decreased balance;Decreased mobility;Decreased knowledge of use of DME;Decreased knowledge of precautions;Decreased skin integrity       PT Treatment Interventions DME instruction;Gait training;Stair training;Functional mobility training;Therapeutic activities;Therapeutic exercise;Balance training;Patient/family education    PT Goals (Current goals can be found in the Care Plan section)  Acute Rehab PT Goals Patient Stated Goal: to improve mobility PT Goal Formulation: With patient Time For Goal Achievement: 04/22/21 Potential to Achieve Goals: Good    Frequency Min 2X/week   Barriers to discharge        Co-evaluation               AM-PAC PT "6 Clicks" Mobility  Outcome Measure Help needed turning from your back to your side while in a flat bed without using bedrails?: None Help needed moving from lying on your back to sitting on the side of a flat bed  without using bedrails?: A Little Help needed moving to and from a bed to a chair (including a wheelchair)?: A Little Help needed standing up from a chair using your arms (e.g., wheelchair or bedside chair)?: A Little Help needed to walk in hospital room?: A Little Help needed climbing 3-5 steps with a railing? : A Little 6 Click Score: 19    End of Session Equipment Utilized During Treatment: Gait belt (up high away from incision) Activity Tolerance: Other (comment) (limited d/t lightheadedness with ambulation) Patient left: in chair;with call bell/phone within reach;with chair alarm set;with family/visitor present Nurse Communication: Mobility status;Precautions;Other (comment) (Drainage concerns L LQ) PT Visit Diagnosis: Other abnormalities of gait and mobility (R26.89);Muscle weakness (generalized) (M62.81);Difficulty in walking, not elsewhere classified (R26.2)    Time: 2633-3545 PT Time Calculation (min) (ACUTE ONLY): 53 min   Charges:   PT Evaluation $PT Eval Low Complexity: 1 Low PT Treatments $Therapeutic Exercise: 8-22 mins $Therapeutic Activity: 8-22 mins       Hendricks Limes, PT 04/08/21, 4:18 PM

## 2021-04-08 NOTE — Progress Notes (Signed)
Rash noted to groin area and buttocks per pt. Not itching. Will relay to day shift nurse to notify day provider to assess area. Rash looks similar to yeast.

## 2021-04-08 NOTE — Progress Notes (Signed)
Cameron [name]  MRN: [id number]  DOB/AGE: 1961-01-10 60 y.o.  Primary Care Physician:[facility]  Admit date: 03/31/2021  Chief Complaint:  Chief Complaint  Patient presents with   Abdominal Pain    S-Pt presented on  03/31/2021 with  Chief Complaint  Patient presents with   Abdominal Pain  . Patient seen sitting at bedside Eating breakfast Denies nausea and vomiting Denies shortness of breath   Medications    allopurinol  100 mg Oral Daily   amiodarone  200 mg Oral BID   apixaban  5 mg Oral BID   atorvastatin  80 mg Oral Daily   carvedilol  3.125 mg Oral BID WC   furosemide  20 mg Oral BID   influenza vaccine adjuvanted  0.5 mL Intramuscular Tomorrow-1000   nystatin cream   Topical BID   polyethylene glycol  17 g Oral Daily   spironolactone  25 mg Oral BID         MCN:OBSJG from the symptoms mentioned above,there are no other symptoms referable to all systems reviewed.  Physical Exam: Vital signs in last 24 hours: Temp:  [97.5 F (36.4 C)-98.7 F (37.1 C)] 97.7 F (36.5 C) (10/03 1120) Pulse Rate:  [74-100] 74 (10/03 1120) Resp:  [12-20] 12 (10/03 1120) BP: (89-103)/(62-71) 89/62 (10/03 1120) SpO2:  [86 %-99 %] 99 % (10/03 1120) Weight change:  Last BM Date: 04/07/21 (per pt.)  Intake/Output from previous day: 10/02 0701 - 10/03 0700 In: 260 [P.O.:260] Out: 1550 [Urine:300; Drains:1250] Total I/O In: -  Out: 560 [Urine:350; Drains:210]   Physical Exam:  General: NAD, laying in bed  Head: Normocephalic, atraumatic. Moist oral mucosal membranes  Eyes: Anicteric  Lungs:  Clear to auscultation, normal effort  Heart: Regular rate and rhythm  Abdomen:  Soft, tender, mild distention, JP drain in situ   Extremities:  trace peripheral edema.  Neurologic: Nonfocal, moving all four extremities  Skin: No lesions    Lab Results:  CBC  Recent Labs    04/07/21 0513 04/08/21 0801  WBC 9.7 11.3*  HGB 12.6* 12.7*  HCT 40.8 41.7   PLT 162 214     BMET  Recent Labs    04/07/21 0513 04/08/21 0801  NA 138 138  K 4.9 4.4  CL 106 106  CO2 22 25  GLUCOSE 89 91  BUN 44* 41*  CREATININE 1.91* 1.72*  CALCIUM 8.7* 8.5*       Most recent Creatinine trend  Lab Results  Component Value Date   CREATININE 1.72 (H) 04/08/2021   CREATININE 1.91 (H) 04/07/2021   CREATININE 2.09 (H) 04/06/2021       MICRO   Recent Results (from the past 240 hour(s))  Resp Panel by RT-PCR (Flu A&B, Covid) Nasopharyngeal Swab     Status: None   Collection Time: 03/31/21  9:22 PM   Specimen: Nasopharyngeal Swab; Nasopharyngeal(NP) swabs in vial transport medium  Result Value Ref Range Status   SARS Coronavirus 2 by RT PCR NEGATIVE NEGATIVE Final    Comment: (NOTE) SARS-CoV-2 target nucleic acids are NOT DETECTED.  The SARS-CoV-2 RNA is generally detectable in upper respiratory specimens during the acute phase of infection. The lowest concentration of SARS-CoV-2 viral copies this assay can detect is 138 copies/mL. A negative result does not preclude SARS-Cov-2 infection and should not be used as the sole basis for treatment or other patient management decisions. A negative result may occur with  improper specimen collection/handling, submission of specimen other than nasopharyngeal  swab, presence of viral mutation(s) within the areas targeted by this assay, and inadequate number of viral copies(<138 copies/mL). A negative result must be combined with clinical observations, patient history, and epidemiological information. The expected result is Negative.  Fact Sheet for Patients:  BloggerCourse.com  Fact Sheet for Healthcare Providers:  SeriousBroker.it  This test is no t yet approved or cleared by the [place] FDA and  has been authorized for detection and/or diagnosis of SARS-CoV-2 by FDA under an Emergency Use Authorization (EUA). This EUA will remain  in  effect (meaning this test can be used) for the duration of the COVID-19 declaration under Section 564(b)(1) of the Act, 21 U.S.C.section 360bbb-3(b)(1), unless the authorization is terminated  or revoked sooner.       Influenza A by PCR NEGATIVE NEGATIVE Final   Influenza B by PCR NEGATIVE NEGATIVE Final    Comment: (NOTE) The Xpert Xpress SARS-CoV-2/FLU/RSV plus assay is intended as an aid in the diagnosis of influenza from Nasopharyngeal swab specimens and should not be used as a sole basis for treatment. Nasal washings and aspirates are unacceptable for Xpert Xpress SARS-CoV-2/FLU/RSV testing.  Fact Sheet for Patients: BloggerCourse.com  Fact Sheet for Healthcare Providers: SeriousBroker.it  This test is not yet approved or cleared by the [place] FDA and has been authorized for detection and/or diagnosis of SARS-CoV-2 by FDA under an Emergency Use Authorization (EUA). This EUA will remain in effect (meaning this test can be used) for the duration of the COVID-19 declaration under Section 564(b)(1) of the Act, 21 U.S.C. section 360bbb-3(b)(1), unless the authorization is terminated or revoked.  Performed at [facility], [address] [place], [place] [address]          Impression:  Mr. MUSSA GROESBECK is a 60 y.o.  male with medical history including CAD, and sCHF with EF 20-25%, , who was admitted to [facility] on 03/31/2021 for Cholecystitis [K81.9] Acute calculous cholecystitis [K80.00] Chest pain [R07.9]     1)Renal    Acute kidney injury Patient has AKI secondary to multiple reasons Patient did receive IV contrast on 03/31/21 Patient was hypotensive Patient does have a history of CHF Patient has propensity for acute kidney injury Patient has had AKI in 2015 with a peak creatinine of 2.0,    - then again in 2021 with a peak creatinine of 1.5    - then again in February 2022 with a peak  creatinine of 2.3  Creatinine continues to improve   2)Hypertension Blood pressure remains soft   3)Anemia of chronic disease  CBC Latest Ref Rng & Units 04/08/2021 04/07/2021 04/04/2021  WBC 4.0 - 10.5 K/uL 11.3(H) 9.7 8.1  Hemoglobin 13.0 - 17.0 g/dL 12.7(L) 12.6(L) 11.7(L)  Hematocrit 39.0 - 52.0 % 41.7 40.8 37.2(L)  Platelets 150 - 400 K/uL 214 162 153    Hgb at goal   4) Secondary hyperparathyroidism -CKD Mineral-Bone Disorder  Lab Results  Component Value Date   CALCIUM 8.5 (L) 04/08/2021   PHOS 5.3 (H) 04/04/2021   Calcium and phosphorus not at goal  5)Acute cholecystitis Patient is s/p laparoscopic cholecystectomy Surgery is following    Wendee Beavers 04/08/2021, 2:21 PM

## 2021-04-08 NOTE — Progress Notes (Signed)
[facility] SURGICAL PROGRESS NOTE  Hospital Day(s): 8.   Post op day(s): 5 Days Post-Op.   Interval History:  Patient seen and examined No acute events or new complaints overnight.  Patient reports he does have some lower abdominal tenderness and feels distended; he believe this is secondary to constipation No fever, chills, nausea, emesis  He does have a mild leukocytosis this morning; 11.3K Hgb stable at 12.3 Renal function is improving; scr - 1.72; UO - 300 ccs Surgical drain with 1250 ccs out; serous He is tolerating a regular diet; tolerating   Vital signs in last 24 hours: [min-max] current  Temp:  [97.5 F (36.4 C)-98.7 F (37.1 C)] 98.2 F (36.8 C) (10/03 0800) Pulse Rate:  [81-100] 99 (10/03 0800) Resp:  [16-20] 17 (10/03 0800) BP: (93-103)/(62-71) 96/71 (10/03 0800) SpO2:  [86 %-99 %] 94 % (10/03 0800)     Height: 5\' 7"  (170.2 cm) Weight: 86.2 kg BMI (Calculated): 29.75   Intake/Output last 2 shifts:  10/02 0701 - 10/03 0700 In: 260 [P.O.:260] Out: 1550 [Urine:300; Drains:1250]   Physical Exam:  Constitutional: alert, cooperative and no distress  Respiratory: breathing non-labored at rest, on [place]  Cardiovascular: regular rate and sinus rhythm  Gastrointestinal: Soft, incisional soreness, he does appear distended, no rebound/guarding. Drain in LLQ with serosanguinous output and reinforced dressing. (removed) Integumentary: Laparoscopic incisions are CDI with steri-strips, no erythema or drainage. He does have some intertrigo in his groin/suprapubic region   Labs:  CBC Latest Ref Rng & Units 04/08/2021 04/07/2021 04/04/2021  WBC 4.0 - 10.5 K/uL 11.3(H) 9.7 8.1  Hemoglobin 13.0 - 17.0 g/dL 12.7(L) 12.6(L) 11.7(L)  Hematocrit 39.0 - 52.0 % 41.7 40.8 37.2(L)  Platelets 150 - 400 K/uL 214 162 153   CMP Latest Ref Rng & Units 04/08/2021 04/07/2021 04/06/2021  Glucose 70 - 99 mg/dL 91 89 90  BUN 6 - 20 mg/dL 06/06/2021) 00(B) 70(W)  Creatinine 0.61 - 1.24  mg/dL 88(Q) 9.16(X) 4.50(T)  Sodium 135 - 145 mmol/L 138 138 136  Potassium 3.5 - 5.1 mmol/L 4.4 4.9 5.1  Chloride 98 - 111 mmol/L 106 106 106  CO2 22 - 32 mmol/L 25 22 24   Calcium 8.9 - 10.3 mg/dL 8.88(K) ) 8.0(K)  Total Protein 6.5 - 8.1 g/dL 5.9(L) 5.9(L) 6.3(L)  Total Bilirubin 0.3 - 1.2 mg/dL 1.1 1.2 0.9  Alkaline Phos 38 - 126 U/L 95 126 119  AST 15 - 41 U/L 38 109(H) 205(H)  ALT 0 - 44 U/L 43 68(H) 73(H)     Imaging studies: No new pertinent imaging studies   Assessment/Plan: 60 y.o. male with improving renal function 5 Days Post-Op s/p robotic assisted laparoscopic cholecystectomy for acute cholecystitis, complicated by pertinent comorbidities including CAD, CHF with EF of 20-25%.   - Okay to continue regular diet    - Will remove drain in attempts to minimize losses; occlusive dressing placed. Leave x48 hours minimum, reinforce as needed   - Monitor renal function; improving; appreciate nephrology assistance - Monitor liver function; may need to increase spironolactone pending BP/Renal function   - Monitor abdominal examination; on-going bowel function  - Pain control prn; antiemetics prn  - Mobilization as tolerated  All of the above findings and recommendations were discussed with the patient, and the medical team, and all of patient's questions were answered to his expressed satisfaction.  -- 1.7(H, PA-C [facility] 04/08/2021, 9:11 AM [phone] M-F: 7am - 4pm

## 2021-04-09 LAB — COMPREHENSIVE METABOLIC PANEL
ALT: 34 U/L (ref 0–44)
AST: 25 U/L (ref 15–41)
Albumin: 3.3 g/dL — ABNORMAL LOW (ref 3.5–5.0)
Alkaline Phosphatase: 93 U/L (ref 38–126)
Anion gap: 6 (ref 5–15)
BUN: 37 mg/dL — ABNORMAL HIGH (ref 6–20)
CO2: 25 mmol/L (ref 22–32)
Calcium: 8.4 mg/dL — ABNORMAL LOW (ref 8.9–10.3)
Chloride: 106 mmol/L (ref 98–111)
Creatinine, Ser: 1.66 mg/dL — ABNORMAL HIGH (ref 0.61–1.24)
GFR, Estimated: 47 mL/min — ABNORMAL LOW (ref >60)
Glucose, Bld: 93 mg/dL (ref 70–99)
Potassium: 4 mmol/L (ref 3.5–5.1)
Sodium: 137 mmol/L (ref 135–145)
Total Bilirubin: 0.8 mg/dL (ref 0.3–1.2)
Total Protein: 5.5 g/dL — ABNORMAL LOW (ref 6.5–8.1)

## 2021-04-09 LAB — PROTIME-INR
INR: 1.6 — ABNORMAL HIGH (ref 0.8–1.2)
Prothrombin Time: 18.8 seconds — ABNORMAL HIGH (ref 11.4–15.2)

## 2021-04-09 LAB — AMMONIA: Ammonia: 15 umol/L (ref 9–35)

## 2021-04-09 MED ORDER — SPIRONOLACTONE 25 MG PO TABS
50.0000 mg | ORAL_TABLET | Freq: Two times a day (BID) | ORAL | Status: DC
  Administered 2021-04-09 – 2021-04-11 (×4): 50 mg via ORAL
  Filled 2021-04-09 (×3): qty 2

## 2021-04-09 NOTE — Progress Notes (Signed)
[facility] SURGICAL PROGRESS NOTE  Hospital Day(s): 9.   Post op day(s): 6 Days Post-Op.   Interval History:  Patient seen and examined No acute events or new complaints overnight.  Patient reports he is doing well; abdominal soreness and tightness are improving No fever, chills, nausea, emesis  Renal function is improving; scr - 1.66; UO - 775 ccs Surgical drain removed yesterday (10/03) in attempts to limit fluid loss; RN noting needing to change dressing multiple time He is tolerating a regular diet; tolerating   Vital signs in last 24 hours: [min-max] current  Temp:  [97.4 F (36.3 C)-98.2 F (36.8 C)] 97.7 F (36.5 C) (10/04 0721) Pulse Rate:  [63-100] 76 (10/04 0721) Resp:  [12-20] 18 (10/04 0721) BP: (89-109)/(62-86) 94/68 (10/04 0721) SpO2:  [92 %-100 %] 100 % (10/04 0721)     Height: 5\' 7"  (170.2 cm) Weight: 86.2 kg BMI (Calculated): 29.Cameron   Intake/Output last 2 shifts:  10/03 0701 - 10/04 0700 In: -  Out: 985 [Urine:775; Drains:210]   Physical Exam:  Constitutional: alert, cooperative and no distress  Respiratory: breathing non-labored at rest, on [place]  Cardiovascular: regular rate and sinus rhythm  Gastrointestinal: Soft, incisional soreness, he does appear distended, no rebound/guarding. Surgical drain removed; replaced with urostomy bag which has seemed to do well managing output; serous  Integumentary: Laparoscopic incisions are CDI with steri-strips, no erythema or drainage. He does have some intertrigo in his groin/suprapubic region   Labs:  CBC Latest Ref Rng & Units 04/08/2021 04/07/2021 04/04/2021  WBC 4.0 - 10.5 K/uL 11.3(H) 9.7 8.1  Hemoglobin 13.0 - 17.0 g/dL 12.7(L) 12.6(L) 11.7(L)  Hematocrit 39.0 - 52.0 % 41.7 40.8 37.2(L)  Platelets 150 - 400 K/uL 214 162 153   CMP Latest Ref Rng & Units 04/09/2021 04/08/2021 04/07/2021  Glucose 70 - 99 mg/dL 93 91 89  BUN 6 - 20 mg/dL 06/07/2021) 76(L) 46(T)  Creatinine 0.61 - 1.24 mg/dL 03(T) 4.65(K)  8.12(X)  Sodium 135 - 145 mmol/L 137 138 138  Potassium 3.5 - 5.1 mmol/L 4.0 4.4 4.9  Chloride 98 - 111 mmol/L 106 106 106  CO2 22 - 32 mmol/L 25 25 22   Calcium 8.9 - 10.3 mg/dL 5.17(G) ) 0.1(V)  Total Protein 6.5 - 8.1 g/dL 4.9(S) 5.9(L) 5.9(L)  Total Bilirubin 0.3 - 1.2 mg/dL 0.8 1.1 1.2  Alkaline Phos 38 - 126 U/L 93 95 126  AST 15 - 41 U/L 25 38 109(H)  ALT 0 - 44 U/L 34 43 68(H)     Imaging studies: No new pertinent imaging studies   Assessment/Plan: 60 y.o. [name] with improving renal function 6 Days Post-Op s/p robotic assisted laparoscopic cholecystectomy for acute cholecystitis, complicated by pertinent comorbidities including CAD, CHF with EF of 20-25%.   - Okay to continue regular diet    - Continue urostomy bag over drain site; anticipate significant output; record  - Monitor renal function; improving; appreciate nephrology assistance - Monitor liver function; double spironolactone dose today (50 mg BID)  - Monitor abdominal examination; on-going bowel function  - Pain control prn; antiemetics prn  - Mobilization as tolerated   - Discharge Planning: Pending stabilization of his decompensated cirrhosis; possibly in 24-48 hours   All of the above findings and recommendations were discussed with the patient, and the medical team, and all of patient's questions were answered to his expressed satisfaction.  -- 46, PA-C [facility] 04/09/2021, 7:37 AM [phone] M-F: 7am - 4pm

## 2021-04-09 NOTE — Progress Notes (Signed)
Physical Therapy Treatment Patient Details Name: Cameron [name] MRN: [id number] DOB: [birth year] Today's Date: 04/09/2021   History of Present Illness Pt is a 60 y.o. male presenting to hospital 9/25 with abdominal pain.  S/p laparoscopic cholecystectomy with interaoperative ICG cholangiography 9/28.  Drain removed 04/08/21.  PMH includes CHF, CAD, HLD, htn, PAD, STEMI, L foot cellulitis, a-fib, and c-spine sx.    PT Comments    Pt received supine in bed, agreeable to therapy. He sat up to EOB Mod I and reported mild dizziness upon sitting that cleared within 1 minute. After standing with CGA for safety, no steadying required, PT assessed ambulation without AD. He was able to ambulate a greater distance of 120ft today with no dizziness reported and mild fatigue. Gait deficits include decreased stance time on LLE (pt reported decreased sensation in LLE) and B external rotation of toes. After a seated rest break, pt ended session with 10 STS, no AD. PT challenged pt to use no hands in whic pt replied he cannot and challenged to use 1 hand with pt subsequently using BUE - one hand on arm rest and one hand on pt knee. With decreased strength and steadiness during ambulation, PT will continue to follow. Would benefit from skilled PT to address above deficits and promote optimal return to PLOF.       Recommendations for follow up therapy are one component of a multi-disciplinary discharge planning process, led by the attending physician.  Recommendations may be updated based on patient status, additional functional criteria and insurance authorization.  Follow Up Recommendations  Home health PT (Intermittent assist)     Equipment Recommendations  Rolling walker with 5" wheels;3in1 (PT)    Recommendations for Other Services OT consult     Precautions / Restrictions Precautions Precautions: Fall Restrictions Weight Bearing Restrictions: No     Mobility  Bed Mobility Overal bed mobility:  Modified Independent             General bed mobility comments: Supine>sit Mod I with use of bed features    Transfers Overall transfer level: Needs assistance Equipment used: None Transfers: Sit to/from Stand Sit to Stand: Min guard         General transfer comment: CGA for minimal steadying progressing to SUP - no RW  Ambulation/Gait Ambulation/Gait assistance: Min guard Gait Distance (Feet): 160 Feet Assistive device: None (pushed O2 tank) Gait Pattern/deviations: Step-through pattern;Decreased stance time - left;Decreased step length - right;Narrow base of support Gait velocity: decreased   General Gait Details: L>R LE external rotation; decreased B LE step length/foot clearance. Trialed ambulation w/o AD however pt did push O2 tank. He required CGA for occasional steadying and was advised to use RW upon initial d/c.   Stairs             Wheelchair Mobility    Modified Rankin (Stroke Patients Only)       Balance Overall balance assessment: Needs assistance Sitting-balance support: No upper extremity supported;Feet supported Sitting balance-Leahy Scale: Good Sitting balance - Comments: steady sitting balance during LB dressing   Standing balance support: Single extremity supported;During functional activity Standing balance-Leahy Scale: Poor Standing balance comment: single UE support on O2 tank during ambulation with occasional clear instances of pt requiring steadying assist                            Cognition Arousal/Alertness: Awake/alert Behavior During Therapy: WFL for tasks assessed/performed Overall Cognitive Status:  Within Functional Limits for tasks assessed                                        Exercises Other Exercises Other Exercises: STS x10 reps for LE strength training. Pt reliant on BUE support - PT challenged pt to use no hands in whic pt replied he cannot and challenged to use 1 hand with pt  subsequently using BUE - one hand on arm rest and one hand on pt knee.    General Comments General comments (skin integrity, edema, etc.): 2L O2 at rest and during mobility. SpO2 noted at 91% after ambulation, pt increased to >95% within 1 minute.      Pertinent Vitals/Pain Pain Assessment: No/denies pain    Home Living                      Prior Function            PT Goals (current goals can now be found in the care plan section) Acute Rehab PT Goals Patient Stated Goal: to return to work    Frequency    Min 2X/week      PT Plan      Co-evaluation              AM-PAC PT "6 Clicks" Mobility   Outcome Measure  Help needed turning from your back to your side while in a flat bed without using bedrails?: None Help needed moving from lying on your back to sitting on the side of a flat bed without using bedrails?: A Little Help needed moving to and from a bed to a chair (including a wheelchair)?: A Little Help needed standing up from a chair using your arms (e.g., wheelchair or bedside chair)?: A Little Help needed to walk in hospital room?: A Little Help needed climbing 3-5 steps with a railing? : A Little 6 Click Score: 19    End of Session Equipment Utilized During Treatment: Gait belt (up high away from incision) Activity Tolerance: Patient tolerated treatment well (limited d/t lightheadedness with ambulation) Patient left: in chair;with call bell/phone within reach;with chair alarm set Nurse Communication: Mobility status PT Visit Diagnosis: Other abnormalities of gait and mobility (R26.89);Muscle weakness (generalized) (M62.81);Difficulty in walking, not elsewhere classified (R26.2)     Time: 4818-5631 PT Time Calculation (min) (ACUTE ONLY): 32 min  Charges:  $Gait Training: 8-22 mins $Therapeutic Activity: 8-22 mins                     Basilia Jumbo PT, DPT 04/09/21 5:06 PM [phone]    Lavenia Atlas 04/09/2021, 5:01 PM

## 2021-04-09 NOTE — Progress Notes (Addendum)
[facility]  ROUNDING NOTE   Subjective:   Patient seen sitting at the side of bed Alert and oriented Tolerated meals Denies shortness of breath States he is ready for discharge Patient seen later in the morning sitting in recliner, completing his session with PT/OT  Objective:  Vital signs in last 24 hours:  Temp:  [97.4 F (36.3 C)-97.8 F (36.6 C)] 97.7 F (36.5 C) (10/04 0721) Pulse Rate:  [63-90] 76 (10/04 0721) Resp:  [16-20] 18 (10/04 0721) BP: (94-109)/(68-86) 94/68 (10/04 0721) SpO2:  [94 %-100 %] 100 % (10/04 0721)  Weight change:  Filed Weights   03/31/21 1905 04/03/21 0700  Weight: 83.9 kg 86.2 kg    Intake/Output: I/O last 3 completed shifts: In: 260 [P.O.:260] Out: 1285 [Urine:775; Drains:510]   Intake/Output this shift:  Total I/O In: -  Out: 250 [Urine:250]  Physical Exam: General: NAD, sitting in chair  Head: Normocephalic, atraumatic. Moist oral mucosal membranes  Eyes: Anicteric  Lungs:  Clear to auscultation, normal effort  Heart: Regular rate and rhythm  Abdomen:  Soft, nontender  Extremities:  trace peripheral edema.  Neurologic: Nonfocal, moving all four extremities  Skin: No lesions       Basic Metabolic Panel: Recent Labs  Lab 04/04/21 0406 04/05/21 0434 04/06/21 0528 04/07/21 0513 04/08/21 0801 04/09/21 0522  NA 138 140 136 138 138 137  K 5.1 5.7* 5.1 4.9 4.4 4.0  CL 106 108 106 106 106 106  CO2 26 25 24 22 25 25   GLUCOSE 130* 116* 90 89 91 93  BUN 38* 46* 47* 44* 41* 37*  CREATININE 1.75* 2.14* 2.09* 1.91* 1.72* 1.66*  CALCIUM 8.2* 8.4* 8.5* 8.7* 8.5* 8.4*  MG 2.1  --   --   --   --   --   PHOS 5.3*  --   --   --   --   --     Liver Function Tests: Recent Labs  Lab 04/04/21 0406 04/06/21 0528 04/07/21 0513 04/08/21 0801 04/09/21 0522  AST 35 205* 109* 38 25  ALT 14 73* 68* 43 34  ALKPHOS 105 119 126 95 93  BILITOT 1.0 0.9 1.2 1.1 0.8  PROT 5.9* 6.3* 5.9* 5.9* 5.5*  ALBUMIN 3.0* 3.4* 3.6 3.6  3.3*   No results for input(s): LIPASE, AMYLASE in the last 168 hours. Recent Labs  Lab 04/09/21 1253  AMMONIA 15    CBC: Recent Labs  Lab 04/03/21 0604 04/04/21 0406 04/07/21 0513 04/08/21 0801  WBC 8.8 8.1 9.7 11.3*  HGB 13.4 11.7* 12.6* 12.7*  HCT 43.7 37.2* 40.8 41.7  MCV 102.6* 103.3* 103.0* 103.5*  PLT 143* 153 162 214    Cardiac Enzymes: No results for input(s): CKTOTAL, CKMB, CKMBINDEX, TROPONINI in the last 168 hours.  BNP: Invalid input(s): POCBNP  CBG: No results for input(s): GLUCAP in the last 168 hours.  Microbiology: Results for orders placed or performed during the hospital encounter of 03/31/21  Resp Panel by RT-PCR (Flu A&B, Covid) Nasopharyngeal Swab     Status: None   Collection Time: 03/31/21  9:22 PM   Specimen: Nasopharyngeal Swab; Nasopharyngeal(NP) swabs in vial transport medium  Result Value Ref Range Status   SARS Coronavirus 2 by RT PCR NEGATIVE NEGATIVE Final    Comment: (NOTE) SARS-CoV-2 target nucleic acids are NOT DETECTED.  The SARS-CoV-2 RNA is generally detectable in upper respiratory specimens during the acute phase of infection. The lowest concentration of SARS-CoV-2 viral copies this assay can detect  is 138 copies/mL. A negative result does not preclude SARS-Cov-2 infection and should not be used as the sole basis for treatment or other patient management decisions. A negative result may occur with  improper specimen collection/handling, submission of specimen other than nasopharyngeal swab, presence of viral mutation(s) within the areas targeted by this assay, and inadequate number of viral copies(<138 copies/mL). A negative result must be combined with clinical observations, patient history, and epidemiological information. The expected result is Negative.  Fact Sheet for Patients:  BloggerCourse.com  Fact Sheet for Healthcare Providers:  SeriousBroker.it  This test is  no t yet approved or cleared by the [place] FDA and  has been authorized for detection and/or diagnosis of SARS-CoV-2 by FDA under an Emergency Use Authorization (EUA). This EUA will remain  in effect (meaning this test can be used) for the duration of the COVID-19 declaration under Section 564(b)(1) of the Act, 21 U.S.C.section 360bbb-3(b)(1), unless the authorization is terminated  or revoked sooner.       Influenza A by PCR NEGATIVE NEGATIVE Final   Influenza B by PCR NEGATIVE NEGATIVE Final    Comment: (NOTE) The Xpert Xpress SARS-CoV-2/FLU/RSV plus assay is intended as an aid in the diagnosis of influenza from Nasopharyngeal swab specimens and should not be used as a sole basis for treatment. Nasal washings and aspirates are unacceptable for Xpert Xpress SARS-CoV-2/FLU/RSV testing.  Fact Sheet for Patients: BloggerCourse.com  Fact Sheet for Healthcare Providers: SeriousBroker.it  This test is not yet approved or cleared by the [place] FDA and has been authorized for detection and/or diagnosis of SARS-CoV-2 by FDA under an Emergency Use Authorization (EUA). This EUA will remain in effect (meaning this test can be used) for the duration of the COVID-19 declaration under Section 564(b)(1) of the Act, 21 U.S.C. section 360bbb-3(b)(1), unless the authorization is terminated or revoked.  Performed at [facility], [address] [place], [place] [address]     Coagulation Studies: Recent Labs    04/09/21 0522  LABPROT 18.8*  INR 1.6*    Urinalysis: No results for input(s): COLORURINE, LABSPEC, PHURINE, GLUCOSEU, HGBUR, BILIRUBINUR, KETONESUR, PROTEINUR, UROBILINOGEN, NITRITE, LEUKOCYTESUR in the last 72 hours.  Invalid input(s): APPERANCEUR    Imaging: No results found.   Medications:     allopurinol  100 mg Oral Daily   amiodarone  200 mg Oral BID   apixaban  5 mg Oral BID    atorvastatin  80 mg Oral Daily   carvedilol  3.125 mg Oral BID WC   furosemide  20 mg Oral BID   influenza vaccine adjuvanted  0.5 mL Intramuscular Tomorrow-1000   nystatin cream   Topical BID   polyethylene glycol  17 g Oral Daily   spironolactone  50 mg Oral BID   colchicine, HYDROcodone-acetaminophen, nitroGLYCERIN, ondansetron **OR** ondansetron (ZOFRAN) IV, oxyCODONE  Assessment/ Plan:  Cameron [name] is a 60 y.o.  male with medical history including CAD, and sCHF with EF 20-25%, , who was admitted to [facility] on 03/31/2021 for Cholecystitis [K81.9] Acute calculous cholecystitis [K80.00] Chest pain [R07.9]   Acute Kidney Injury with baseline creatinine 1.15 on 04/01/21.  Acute kidney injury complicated by CHF.  Acute kidney injury secondary to hypotension and possible contrast nephropathy IV contrast exposure on 03/31/21 No acute indication for dialysis. Creatinine improved. UOP 775 ml in last 24 hours. Encouraged patient to increase oral nutrition. Will schedule a follow up appt with our office 1-2 weeks after discharge.  Lab Results  Component Value Date   CREATININE 1.66 (H) 04/09/2021   CREATININE 1.72 (H) 04/08/2021   CREATININE 1.91 (H) 04/07/2021    Intake/Output Summary (Last 24 hours) at 04/09/2021 1404 Last data filed at 04/09/2021 1300 Gross per 24 hour  Intake --  Output 675 ml  Net -675 ml   2. Anemia of chronic disease  Lab Results  Component Value Date   HGB 12.7 (L) 04/08/2021   Hgb at target  3. Secondary Hyperparathyroidism:  Lab Results  Component Value Date   CALCIUM 8.4 (L) 04/09/2021   PHOS 5.3 (H) 04/04/2021   Bone minerals not at goal  4. Acute cholecystitis Patient is s/p laparoscopic cholecystectomy on 04/03/21 Surgery is following  5. Hypotension Currently on Carvedilol, furosemide and spironolactone. Bp 94/68   LOS: 9 Chamara Dyck 10/4/20222:04 PM

## 2021-04-09 NOTE — Evaluation (Signed)
Occupational Therapy Evaluation Patient Details Name: Cameron [name] MRN: [id number] DOB: [birth year] Today's Date: 04/09/2021   History of Present Illness Pt is a 60 y.o. male presenting to hospital 9/25 with abdominal pain.  S/p laparoscopic cholecystectomy with interaoperative ICG cholangiography 9/28.  Drain removed 04/08/21.  PMH includes CHF, CAD, HLD, htn, PAD, STEMI, L foot cellulitis, a-fib, and c-spine sx.   Clinical Impression   Pt seen for OT evaluation this date. At baseline, pt works, is independent in all ADLs/IADLs, does not use AD for functional mobility, and lives in a 1-story home alone. Pt does not use supplemental O2 at baseline. Upon arrival to room, pt sitting EOB soiled in fluid, with ostomy bag (originally placed by RN to collect drainage at L LQ) dislodged; RN informed and replaced immediately. Pt assisted with changing clothing/linen; required SUPERVISION/SET-UP for standing UB dressing, MIN GUARD for sit>stand LB dressing, and MIN GUARD for LB bathing d/t decreased balance, strength, and activity tolerance. Pt also noted to have BM during session, requiring MIN GUARD for toilet transfer/hygiene and verbal cues for using grab bar instead of sink counter for UE support. Pt would benefit from additional skilled OT services to maximize return to PLOF and minimize risk of future falls, injury, caregiver burden, and readmission. Upon discharge, recommend HHOT services.         Recommendations for follow up therapy are one component of a multi-disciplinary discharge planning process, led by the attending physician.  Recommendations may be updated based on patient status, additional functional criteria and insurance authorization.   Follow Up Recommendations  Home health OT;Supervision - Intermittent    Equipment Recommendations  3 in 1 bedside commode;Tub/shower seat;Other (comment) (2ww)       Precautions / Restrictions Precautions Precautions: Fall Restrictions Weight  Bearing Restrictions: No      Mobility Bed Mobility               General bed mobility comments: not assessed; pt sitting at EOB upon arrival and sitting in recliner at end of session    Transfers Overall transfer level: Needs assistance Equipment used: Rolling walker (2 wheeled) Transfers: Sit to/from Stand Sit to Stand: Min guard         General transfer comment: Requires increased effort to stand from toilet (d/t low toilet height)    Balance Overall balance assessment: Needs assistance Sitting-balance support: No upper extremity supported;Feet supported Sitting balance-Leahy Scale: Good Sitting balance - Comments: steady sitting balance during LB dressing   Standing balance support: Single extremity supported;During functional activity Standing balance-Leahy Scale: Fair Standing balance comment: single UE support, required MIN GUARD for standing balance during sit>stand LB bathing/peri-care                           ADL either performed or assessed with clinical judgement   ADL Overall ADL's : Needs assistance/impaired     Grooming: Wash/dry hands;Supervision/safety;Standing       Lower Body Bathing: Min guard;Sit to/from stand   Upper Body Dressing : Supervision/safety;Set up;Standing Upper Body Dressing Details (indicate cue type and reason): to don/doff hospital gown Lower Body Dressing: Min guard;Sit to/from stand Lower Body Dressing Details (indicate cue type and reason): to don/doff socks and underwear Toilet Transfer: Min guard;Ambulation;Regular Toilet;Grab bars Toilet Transfer Details (indicate cue type and reason): Verbal cues for using grab bar instead of sink counter Toileting- Clothing Manipulation and Hygiene: Min guard;Sitting/lateral lean       Functional  mobility during ADLs: Min guard;Rolling walker        Pertinent Vitals/Pain Pain Assessment: No/denies pain        Extremity/Trunk Assessment Upper Extremity  Assessment Upper Extremity Assessment: Overall WFL for tasks assessed (Grossly at least 4-/5 in all movements)   Lower Extremity Assessment Lower Extremity Assessment: Generalized weakness       Communication Communication Communication: No difficulties   Cognition Arousal/Alertness: Awake/alert Behavior During Therapy: WFL for tasks assessed/performed Overall Cognitive Status: Within Functional Limits for tasks assessed                                     General Comments  Pt appeared to be soiled in fluid upon arrival. Ostomy bag originally placed to collect drainage at L LQ appeared to be dislodged. RN informed and replaced immediately    Exercises Other Exercises Other Exercises: Educated pt on role of OT, POC, and discharge recommendations. Pt verbalized understanding Other Exercises: Educated pt on falls prevention strategies including managing RW around environmental obstacles, using grabs bars instead of wet sink counters for external support during toilet transfers, and using supplemental O2 at all times, including walk to/from bathroom. Pt verbalized understanding however continued to require verbal cues to implement education into ADLs/functional mobility        Home Living Family/patient expects to be discharged to:: Private residence Living Arrangements: Alone Available Help at Discharge: Family;Friend(s);Available PRN/intermittently Type of Home: House Home Access: Stairs to enter [employer] of Steps: 5 Entrance Stairs-Rails:  (one rail in middle of stairs) Home Layout: One level     Bathroom Shower/Tub: Walk-in shower         Home Equipment: Environmental consultant - 2 wheels;Crutches;Walker - 4 wheels;Bedside commode          Prior Functioning/Environment Level of Independence: Independent        Comments: independent with ADLs, IADLs, and functional mobility (without AD). H/o near falls (able to catch self).        OT Problem List:  Decreased strength;Decreased activity tolerance;Impaired balance (sitting and/or standing);Decreased knowledge of use of DME or AE      OT Treatment/Interventions: Self-care/ADL training;Therapeutic exercise;Energy conservation;DME and/or AE instruction;Therapeutic activities;Patient/family education;Balance training    OT Goals(Current goals can be found in the care plan section) Acute Rehab OT Goals Patient Stated Goal: to return to work OT Goal Formulation: With patient Time For Goal Achievement: 04/23/21 Potential to Achieve Goals: Fair ADL Goals Pt Will Perform Grooming: with modified independence;standing Pt Will Perform Lower Body Bathing: with modified independence;sit to/from stand Pt Will Transfer to Toilet: with modified independence;ambulating;bedside commode  OT Frequency: Min 1X/week    AM-PAC OT "6 Clicks" Daily Activity     Outcome Measure Help from another person eating meals?: None Help from another person taking care of personal grooming?: A Little Help from another person toileting, which includes using toliet, bedpan, or urinal?: A Little Help from another person bathing (including washing, rinsing, drying)?: A Little Help from another person to put on and taking off regular upper body clothing?: None Help from another person to put on and taking off regular lower body clothing?: A Little 6 Click Score: 20   End of Session Equipment Utilized During Treatment: Rolling walker Nurse Communication: Mobility status;Other (comment) (drainage at L LQ)  Activity Tolerance: Patient tolerated treatment well Patient left: in chair;with call bell/phone within reach;with chair alarm set;Other (comment) (with providers  from nephrology)  OT Visit Diagnosis: Unsteadiness on feet (R26.81);Muscle weakness (generalized) (M62.81)                Time: 4827-0786 OT Time Calculation (min): 48 min Charges:  OT General Charges $OT Visit: 1 Visit OT Evaluation $OT Eval Moderate  Complexity: 1 Mod OT Treatments $Self Care/Home Management : 38-52 mins  Matthew Folks, OTR/L [facility] [phone]

## 2021-04-10 ENCOUNTER — Encounter: Payer: Self-pay | Admitting: General Surgery

## 2021-04-10 LAB — COMPREHENSIVE METABOLIC PANEL
ALT: 29 U/L (ref 0–44)
AST: 26 U/L (ref 15–41)
Albumin: 3.3 g/dL — ABNORMAL LOW (ref 3.5–5.0)
Alkaline Phosphatase: 109 U/L (ref 38–126)
Anion gap: 5 (ref 5–15)
BUN: 29 mg/dL — ABNORMAL HIGH (ref 6–20)
CO2: 27 mmol/L (ref 22–32)
Calcium: 8.4 mg/dL — ABNORMAL LOW (ref 8.9–10.3)
Chloride: 108 mmol/L (ref 98–111)
Creatinine, Ser: 1.21 mg/dL (ref 0.61–1.24)
GFR, Estimated: >60 mL/min (ref >60)
Glucose, Bld: 87 mg/dL (ref 70–99)
Potassium: 4 mmol/L (ref 3.5–5.1)
Sodium: 140 mmol/L (ref 135–145)
Total Bilirubin: 1 mg/dL (ref 0.3–1.2)
Total Protein: 5.6 g/dL — ABNORMAL LOW (ref 6.5–8.1)

## 2021-04-10 LAB — PROTIME-INR
INR: 1.4 — ABNORMAL HIGH (ref 0.8–1.2)
Prothrombin Time: 17.2 seconds — ABNORMAL HIGH (ref 11.4–15.2)

## 2021-04-10 MED ORDER — FUROSEMIDE 20 MG PO TABS
20.0000 mg | ORAL_TABLET | Freq: Two times a day (BID) | ORAL | Status: DC
  Administered 2021-04-10 – 2021-04-11 (×2): 20 mg via ORAL
  Filled 2021-04-10 (×2): qty 1

## 2021-04-10 MED ORDER — FUROSEMIDE 40 MG PO TABS: 40.0000 mg | ORAL_TABLET | Freq: Two times a day (BID) | ORAL | Status: DC

## 2021-04-10 MED ORDER — ALBUMIN HUMAN 25 % IV SOLN
25.0000 g | Freq: Four times a day (QID) | INTRAVENOUS | Status: AC
Start: 2021-04-10 — End: 2021-04-11
  Administered 2021-04-10 – 2021-04-11 (×3): 25 g via INTRAVENOUS
  Filled 2021-04-10 (×3): qty 100

## 2021-04-10 NOTE — Progress Notes (Signed)
Pt. Output from d/c drain. 0900 am=100 ml, 1630= .

## 2021-04-10 NOTE — Progress Notes (Signed)
Mobility Specialist - Progress Note   04/10/21 1441  Mobility  Activity Ambulated in hall  Level of Assistance Modified independent, requires aide device or extra time  Assistive Device Front wheel walker  Distance Ambulated (ft) 280 ft  Mobility Ambulated with assistance in hallway  Mobility Response Tolerated well  Mobility performed by Mobility specialist  $Mobility charge 1 Mobility    O2 while resting on RA = 98% O2 while AMB on RA = 86% O2 while AMB on 2L = 93%   Pt ambulated in hallway with RW. BP prior to activity 107/73. No dizziness or complaints.    Filiberto Pinks Mobility Specialist 04/10/21, 2:49 PM

## 2021-04-10 NOTE — Progress Notes (Addendum)
[facility] SURGICAL PROGRESS NOTE  Hospital Day(s): 10.   Post op day(s): 7 Days Post-Op.   Interval History:  Patient seen and examined No acute events or new complaints overnight.  Patient reports his biggest issue is significant edema of his bilateral lower extremities; he is on only 1/2 his normal dose of lasix No abdominal pain, fever, chills, nausea, emesis  Renal function has normalized this morning; sCr - 1.22; UO - 1000 ccs + unmeasured No significant electrolyte derangements Drain output not recorded but her reports emptying his urostomy bag (which is draining previous drain site) about 3x in last 24 hours He is tolerating a regular diet; tolerating     Vital signs in last 24 hours: [min-max] current  Temp:  [97.5 F (36.4 C)-98.4 F (36.9 C)] 98.4 F (36.9 C) (10/05 0819) Pulse Rate:  [67-86] 84 (10/05 0819) Resp:  [16-20] 17 (10/05 0751) BP: (86-121)/(54-85) 102/77 (10/05 0819) SpO2:  [93 %-100 %] 93 % (10/05 0819)     Height: 5\' 7"  (170.2 cm) Weight: 86.2 kg BMI (Calculated): 29.75   Intake/Output last 2 shifts:  10/04 0701 - 10/05 0700 In: 240 [P.O.:240] Out: 900 [Urine:900]   Physical Exam:  Constitutional: alert, cooperative and no distress  Respiratory: breathing non-labored at rest, on [place]  Cardiovascular: regular rate and sinus rhythm  Gastrointestinal: Soft, incisional soreness, he does appear distended, no rebound/guarding. Surgical drain removed; replaced with urostomy bag which has seemed to do well managing output; serous  Musculoskeletal: He does have significant bilateral pitting edema to the bilateral lower extremities to above the knee, no calf pain, Negative Homans' sign Integumentary: Laparoscopic incisions are CDI with steri-strips, no erythema or drainage. He does have some intertrigo in his groin/suprapubic region   Labs:  CBC Latest Ref Rng & Units 04/08/2021 04/07/2021 04/04/2021  WBC 4.0 - 10.5 K/uL 11.3(H) 9.7 8.1   Hemoglobin 13.0 - 17.0 g/dL 12.7(L) 12.6(L) 11.7(L)  Hematocrit 39.0 - 52.0 % 41.7 40.8 37.2(L)  Platelets 150 - 400 K/uL 214 162 153   CMP Latest Ref Rng & Units 04/10/2021 04/09/2021 04/08/2021  Glucose 70 - 99 mg/dL 87 93 91  BUN 6 - 20 mg/dL 06/08/2021) 58(I) 50(Y)  Creatinine 0.61 - 1.24 mg/dL 77(A 1.28) 7.86(V)  Sodium 135 - 145 mmol/L 140 137 138  Potassium 3.5 - 5.1 mmol/L 4.0 4.0 4.4  Chloride 98 - 111 mmol/L 108 106 106  CO2 22 - 32 mmol/L 27 25 25   Calcium 8.9 - 10.3 mg/dL 6.72(C) ) 9.4(B)  Total Protein 6.5 - 8.1 g/dL 0.9(G) 2.8(Z) 5.9(L)  Total Bilirubin 0.3 - 1.2 mg/dL 1.0 0.8 1.1  Alkaline Phos 38 - 126 U/L 109 93 95  AST 15 - 41 U/L 26 25 38  ALT 0 - 44 U/L 29 34 43     Imaging studies: No new pertinent imaging studies   Assessment/Plan:  60 y.o. male with improved renal function 7 Days Post-Op s/p robotic assisted laparoscopic cholecystectomy for acute cholecystitis, complicated by pertinent comorbidities including CAD, CHF with EF of 20-25%.   - Okay to continue regular diet               - Continue urostomy bag over drain site; anticipate significant output; record  - ? Increase lasix to home dose (40 mg BID) for peripheral edema now that renal function has normalized              - Monitor renal function; improving; appreciate nephrology assistance -  Monitor liver function; double spironolactone dose today (50 mg BID)             - Monitor abdominal examination; on-going bowel function             - Pain control prn; antiemetics prn             - Mobilization as tolerated; worked with therapies; recommending home health; orders placed this morning                - Discharge Planning: ? Ready for DC tomorrow morning   All of the above findings and recommendations were discussed with the patient, and the medical team, and all of patient's questions were answered to his expressed satisfaction.  -- Lynden Oxford, PA-C [facility] 04/10/2021,  9:23 AM [phone] M-F: 7am - 4pm

## 2021-04-10 NOTE — Plan of Care (Signed)

## 2021-04-10 NOTE — Progress Notes (Addendum)
[facility]  ROUNDING NOTE   Subjective:   Patient seen laying in bed Alert  Denies pain and discomfort Denies shortness of breath  Objective:  Vital signs in last 24 hours:  Temp:  [97.5 F (36.4 C)-98.4 F (36.9 C)] 98.2 F (36.8 C) (10/05 1100) Pulse Rate:  [67-86] 86 (10/05 1327) Resp:  [16-20] 17 (10/05 1100) BP: (84-121)/(53-85) 91/66 (10/05 1327) SpO2:  [93 %-100 %] 93 % (10/05 0819)  Weight change:  Filed Weights   03/31/21 1905 04/03/21 0700  Weight: 83.9 kg 86.2 kg    Intake/Output: I/O last 3 completed shifts: In: 240 [P.O.:240] Out: 1325 [Urine:1325]   Intake/Output this shift:  Total I/O In: 340 [P.O.:340] Out: 1000 [Urine:1000]  Physical Exam: General: NAD, laying in bed  Head: Normocephalic, atraumatic. Moist oral mucosal membranes  Eyes: Anicteric  Lungs:  Clear to auscultation, normal effort  Heart: Regular rate and rhythm  Abdomen:  Soft, nontender  Extremities:  1+ peripheral edema.  Neurologic: Nonfocal, moving all four extremities  Skin: No lesions       Basic Metabolic Panel: Recent Labs  Lab 04/04/21 0406 04/05/21 0434 04/06/21 0528 04/07/21 0513 04/08/21 0801 04/09/21 0522 04/10/21 0357  NA 138   < > 136 138 138 137 140  K 5.1   < > 5.1 4.9 4.4 4.0 4.0  CL 106   < > 106 106 106 106 108  CO2 26   < > 24 22 25 25 27   GLUCOSE 130*   < > 90 89 91 93 87  BUN 38*   < > 47* 44* 41* 37* 29*  CREATININE 1.75*   < > 2.09* 1.91* 1.72* 1.66* 1.21  CALCIUM 8.2*   < > 8.5* 8.7* 8.5* 8.4* 8.4*  MG 2.1  --   --   --   --   --   --   PHOS 5.3*  --   --   --   --   --   --    < > = values in this interval not displayed.     Liver Function Tests: Recent Labs  Lab 04/06/21 0528 04/07/21 0513 04/08/21 0801 04/09/21 0522 04/10/21 0357  AST 205* 109* 38 25 26  ALT 73* 68* 43 34 29  ALKPHOS 119 126 95 93 109  BILITOT 0.9 1.2 1.1 0.8 1.0  PROT 6.3* 5.9* 5.9* 5.5* 5.6*  ALBUMIN 3.4* 3.6 3.6 3.3* 3.3*    No results  for input(s): LIPASE, AMYLASE in the last 168 hours. Recent Labs  Lab 04/09/21 1253  AMMONIA 15     CBC: Recent Labs  Lab 04/04/21 0406 04/07/21 0513 04/08/21 0801  WBC 8.1 9.7 11.3*  HGB 11.7* 12.6* 12.7*  HCT 37.2* 40.8 41.7  MCV 103.3* 103.0* 103.5*  PLT 153 162 214     Cardiac Enzymes: No results for input(s): CKTOTAL, CKMB, CKMBINDEX, TROPONINI in the last 168 hours.  BNP: Invalid input(s): POCBNP  CBG: No results for input(s): GLUCAP in the last 168 hours.  Microbiology: Results for orders placed or performed during the hospital encounter of 03/31/21  Resp Panel by RT-PCR (Flu A&B, Covid) Nasopharyngeal Swab     Status: None   Collection Time: 03/31/21  9:22 PM   Specimen: Nasopharyngeal Swab; Nasopharyngeal(NP) swabs in vial transport medium  Result Value Ref Range Status   SARS Coronavirus 2 by RT PCR NEGATIVE NEGATIVE Final    Comment: (NOTE) SARS-CoV-2 target nucleic acids are NOT DETECTED.  The SARS-CoV-2  RNA is generally detectable in upper respiratory specimens during the acute phase of infection. The lowest concentration of SARS-CoV-2 viral copies this assay can detect is 138 copies/mL. A negative result does not preclude SARS-Cov-2 infection and should not be used as the sole basis for treatment or other patient management decisions. A negative result may occur with  improper specimen collection/handling, submission of specimen other than nasopharyngeal swab, presence of viral mutation(s) within the areas targeted by this assay, and inadequate number of viral copies(<138 copies/mL). A negative result must be combined with clinical observations, patient history, and epidemiological information. The expected result is Negative.  Fact Sheet for Patients:  BloggerCourse.com  Fact Sheet for Healthcare Providers:  SeriousBroker.it  This test is no t yet approved or cleared by the [place] FDA  and  has been authorized for detection and/or diagnosis of SARS-CoV-2 by FDA under an Emergency Use Authorization (EUA). This EUA will remain  in effect (meaning this test can be used) for the duration of the COVID-19 declaration under Section 564(b)(1) of the Act, 21 U.S.C.section 360bbb-3(b)(1), unless the authorization is terminated  or revoked sooner.       Influenza A by PCR NEGATIVE NEGATIVE Final   Influenza B by PCR NEGATIVE NEGATIVE Final    Comment: (NOTE) The Xpert Xpress SARS-CoV-2/FLU/RSV plus assay is intended as an aid in the diagnosis of influenza from Nasopharyngeal swab specimens and should not be used as a sole basis for treatment. Nasal washings and aspirates are unacceptable for Xpert Xpress SARS-CoV-2/FLU/RSV testing.  Fact Sheet for Patients: BloggerCourse.com  Fact Sheet for Healthcare Providers: SeriousBroker.it  This test is not yet approved or cleared by the [place] FDA and has been authorized for detection and/or diagnosis of SARS-CoV-2 by FDA under an Emergency Use Authorization (EUA). This EUA will remain in effect (meaning this test can be used) for the duration of the COVID-19 declaration under Section 564(b)(1) of the Act, 21 U.S.C. section 360bbb-3(b)(1), unless the authorization is terminated or revoked.  Performed at [facility], [address] [place], [place] [address]     Coagulation Studies: Recent Labs    04/09/21 0522 04/10/21 0357  LABPROT 18.8* 17.2*  INR 1.6* 1.4*     Urinalysis: No results for input(s): COLORURINE, LABSPEC, PHURINE, GLUCOSEU, HGBUR, BILIRUBINUR, KETONESUR, PROTEINUR, UROBILINOGEN, NITRITE, LEUKOCYTESUR in the last 72 hours.  Invalid input(s): APPERANCEUR    Imaging: No results found.   Medications:     allopurinol  100 mg Oral Daily   amiodarone  200 mg Oral BID   apixaban  5 mg Oral BID   atorvastatin  80 mg Oral Daily    carvedilol  3.125 mg Oral BID WC   furosemide  20 mg Oral BID   influenza vaccine adjuvanted  0.5 mL Intramuscular Tomorrow-1000   nystatin cream   Topical BID   polyethylene glycol  17 g Oral Daily   spironolactone  50 mg Oral BID   colchicine, HYDROcodone-acetaminophen, nitroGLYCERIN, ondansetron **OR** ondansetron (ZOFRAN) IV, oxyCODONE  Assessment/ Plan:  Cameron [name] is a 60 y.o.  male with medical history including CAD, and sCHF with EF 20-25%, , who was admitted to [facility] on 03/31/2021 for Cholecystitis [K81.9] Acute calculous cholecystitis [K80.00] Chest pain [R07.9]   Acute Kidney Injury with baseline creatinine 1.15 on 04/01/21.  Acute kidney injury complicated by CHF.  Acute kidney injury secondary to hypotension and possible contrast nephropathy IV contrast exposure on 03/31/21 No acute indication for dialysis. Creatinine at  baseline. UOP 900 ml in last 24 hours. Patient will follow up with our office at discharge.  Lab Results  Component Value Date   CREATININE 1.21 04/10/2021   CREATININE 1.66 (H) 04/09/2021   CREATININE 1.72 (H) 04/08/2021    Intake/Output Summary (Last 24 hours) at 04/10/2021 1347 Last data filed at 04/10/2021 1041 Gross per 24 hour  Intake 580 ml  Output 1650 ml  Net -1070 ml    2. Anemia of chronic disease  Lab Results  Component Value Date   HGB 12.7 (L) 04/08/2021   Hgb at goal  3. Secondary Hyperparathyroidism:  Lab Results  Component Value Date   CALCIUM 8.4 (L) 04/10/2021   PHOS 5.3 (H) 04/04/2021   Bone minerals not at goal  4. Acute cholecystitis Patient is s/p laparoscopic cholecystectomy on 04/03/21 Surgery is following  5. Hypotension Currently on Carvedilol, furosemide and spironolactone. Bp 91/66. Will evaluate need to restart Lisinopril at outpatient appt.   Due to renal recovery, we will sign off at this time   LOS: 10 Analese Sovine 10/5/20221:47 PM

## 2021-04-10 NOTE — Progress Notes (Signed)
Pt seen and examined. I agree w Cameron [name]. Adjusting lasix based on BP. Cirrhosis and ascites is his main problem. Plan for DC in am

## 2021-04-11 LAB — COMPREHENSIVE METABOLIC PANEL
ALT: 21 U/L (ref 0–44)
AST: 20 U/L (ref 15–41)
Albumin: 3.5 g/dL (ref 3.5–5.0)
Alkaline Phosphatase: 82 U/L (ref 38–126)
Anion gap: 6 (ref 5–15)
BUN: 25 mg/dL — ABNORMAL HIGH (ref 6–20)
CO2: 25 mmol/L (ref 22–32)
Calcium: 8.4 mg/dL — ABNORMAL LOW (ref 8.9–10.3)
Chloride: 109 mmol/L (ref 98–111)
Creatinine, Ser: 1.3 mg/dL — ABNORMAL HIGH (ref 0.61–1.24)
GFR, Estimated: >60 mL/min (ref >60)
Glucose, Bld: 101 mg/dL — ABNORMAL HIGH (ref 70–99)
Potassium: 4 mmol/L (ref 3.5–5.1)
Sodium: 140 mmol/L (ref 135–145)
Total Bilirubin: 0.9 mg/dL (ref 0.3–1.2)
Total Protein: 5.6 g/dL — ABNORMAL LOW (ref 6.5–8.1)

## 2021-04-11 LAB — PROTIME-INR
INR: 1.6 — ABNORMAL HIGH (ref 0.8–1.2)
Prothrombin Time: 19.4 seconds — ABNORMAL HIGH (ref 11.4–15.2)

## 2021-04-11 MED ORDER — OXYCODONE HCL 5 MG PO TABS: 5.0000 mg | ORAL_TABLET | ORAL | 0 refills | Status: DC | PRN

## 2021-04-11 MED ORDER — SPIRONOLACTONE 25 MG PO TABS: 25.0000 mg | ORAL_TABLET | Freq: Two times a day (BID) | ORAL | 0 refills | Status: DC

## 2021-04-11 MED ORDER — FUROSEMIDE 20 MG PO TABS: 20.0000 mg | ORAL_TABLET | Freq: Two times a day (BID) | ORAL | 1 refills | Status: DC

## 2021-04-11 MED ORDER — SPIRONOLACTONE 25 MG PO TABS: 25.0000 mg | ORAL_TABLET | Freq: Two times a day (BID) | ORAL | Status: DC

## 2021-04-11 NOTE — Discharge Instructions (Signed)

## 2021-04-11 NOTE — Progress Notes (Signed)
AVS given and explained to patient, wound/drainage care education provided.

## 2021-04-11 NOTE — TOC Transition Note (Signed)
Transition of Care [facility]) - CM/SW Discharge Note   Patient Details  Name: Cameron [name] MRN: [id number] Date of Birth: 31-Oct-1960  [facility]) CM/SW Contact:  Chapman Fitch, RN Phone Number: 04/11/2021, 3:38 PM   Clinical Narrative:     Patient to discharge today Patient states that a friend will be picking him today  I was unable to secure home health services due to patients payor.  He was notified and in agreement  Patient has Equipment: Dan Humphreys - 2 wheels;Crutches;Walker - 4 wheels;Bedside commode in the home  Patient agreeable for Remote Health PCP  Cindra Eves has accepted the referral and patient will have new patient appointment tomorrow between 9 and 930.  Patient aware     Barriers to Discharge: Continued Medical Work up   Patient Goals and CMS Choice        Discharge Placement                       Discharge Plan and Services                                     Social Determinants of Health (SDOH) Interventions     Readmission Risk Interventions Readmission Risk Prevention Plan 04/04/2021  Transportation Screening Complete  PCP or Specialist Appt within 3-5 Days Complete  Social Work Consult for Recovery Care Planning/Counseling Complete  Palliative Care Screening Not Applicable  Medication Review Oceanographer) Complete  Some recent data might be hidden

## 2021-04-11 NOTE — Plan of Care (Signed)

## 2021-04-12 NOTE — Discharge Summary (Signed)
Patient ID: Cameron [name] MRN: [id number] DOB/AGE: March 12, 1961 60 y.o.  Admit date: 03/31/2021 Discharge date: 04/12/2021   Discharge Diagnoses:  Active Problems:   Acute calculous cholecystitis   Procedures:robotic lap cholecystectomy  Hospital Course:  60 yo admitted with findings consistent with acute cholecystitis , he was admitted to the hospital placed on IV antibiotics and given his multiple perioperative risk factors and heart issues we ask cardiology to see him preoperatively.  He does have a history of A. fib and congestive heart failure.  They deemed him that he was appropriate to undergo general anesthetic, he  was taken promptly to the operating room for an uneventful robotic laparoscopic cholecystectomy.,  Intraoperatively there was significant ascites and cirrhosis.  Patient  ascites was the main driving factor that The patient in hospital.  He also developed acute kidney injury and he was supported appropriately.  Nephrology was consulted and and that they adjusted appropriate medications.  He was resumed his anticoagulation without any issues.  He was also placed on spironolactone and Lasix to control the ascites.  We removed the drain and brought he continued to leak some ascites we decided to place a colostomy bag over the small incision site.. At The time of discharge the patient was ambulating,  pain was controlled.  His vital signs were stable and she was afebrile.   physical exam at discharge showed a pt  in no acute distress.  Awake and alert.  Abdomen: Soft incisions healing well without infection or peritonitis.  Extremities well-perfused and no edema.  Condition of the patient the time of discharge was stable. Have also arrange for a consultation with GI/hepatology to manage long-term cirrhosis and ascites.   Consults: Cardiology, nephrology  Disposition: Discharge disposition: 01-Home or Self Care       Discharge Instructions     Call MD for:  difficulty  breathing, headache or visual disturbances   Complete by: As directed    Call MD for:  extreme fatigue   Complete by: As directed    Call MD for:  hives   Complete by: As directed    Call MD for:  persistant dizziness or light-headedness   Complete by: As directed    Call MD for:  persistant nausea and vomiting   Complete by: As directed    Call MD for:  redness, tenderness, or signs of infection (pain, swelling, redness, odor or green/yellow discharge around incision site)   Complete by: As directed    Call MD for:  severe uncontrolled pain   Complete by: As directed    Call MD for:  temperature >100.4   Complete by: As directed    Diet - low sodium heart healthy   Complete by: As directed    Discharge instructions   Complete by: As directed    Change ostomy bag prn   Increase activity slowly   Complete by: As directed    Lifting restrictions   Complete by: As directed    20 lbs x 6 wks      Allergies as of 04/11/2021   No Known Allergies      Medication List     STOP taking these medications    allopurinol 100 MG tablet Commonly known as: Zyloprim   ascorbic acid 500 MG tablet Commonly known as: VITAMIN C   cyanocobalamin 1000 MCG tablet   folic acid 1 MG tablet Commonly known as: FOLVITE   lisinopril 2.5 MG tablet Commonly known as: ZESTRIL   metolazone  2.5 MG tablet Commonly known as: ZAROXOLYN   oxyCODONE-acetaminophen 5-325 MG tablet Commonly known as: PERCOCET/ROXICET   potassium chloride 10 MEQ tablet Commonly known as: KLOR-CON       TAKE these medications    acetaminophen 500 MG tablet Commonly known as: TYLENOL Take 500 mg by mouth daily.   amiodarone 200 MG tablet Commonly known as: PACERONE Take 1 tablet by mouth 2 (two) times daily.   apixaban 5 MG Tabs tablet Commonly known as: ELIQUIS Take 1 tablet (5 mg total) by mouth 2 (two) times daily.   apixaban 5 MG Tabs tablet Commonly known as: ELIQUIS Take 1 tablet by mouth 2  (two) times daily.   atorvastatin 80 MG tablet Commonly known as: LIPITOR Take 1 tablet (80 mg total) by mouth daily.   carvedilol 3.125 MG tablet Commonly known as: COREG Take 1 tablet (3.125 mg total) by mouth 2 (two) times daily with a meal.   colchicine 0.6 MG tablet Take 1 tablet (0.6 mg total) by mouth daily as needed.   collagenase ointment Commonly known as: SANTYL Apply topically daily.   feeding supplement Liqd Take 237 mLs by mouth 2 (two) times daily between meals.   furosemide 20 MG tablet Commonly known as: Lasix Take 1 tablet (20 mg total) by mouth 2 (two) times daily. What changed: medication strength   nitroGLYCERIN 0.4 MG SL tablet Commonly known as: NITROSTAT Place under the tongue.   oxyCODONE 5 MG immediate release tablet Commonly known as: Roxicodone Take 1 tablet (5 mg total) by mouth every 4 (four) hours as needed for moderate pain.   spironolactone 25 MG tablet Commonly known as: ALDACTONE Take 1 tablet (25 mg total) by mouth 2 (two) times daily.        Follow-up Information     Miki Kins, FNP. Go on 04/15/2021.   Specialty: Family Medicine Why: PCP appointment made for [facility] on 04/15/21 at 10 am   Provider Grayling Congress, NP Contact information: [address] [place] [address] [phone]         Lamar Blinks, MD Follow up in 1 week(s).   Specialty: Cardiology Contact information: [address] [facility] [place] [address] [phone]         Jaynie Collins, DO Follow up in 1 week(s).   Why: cirrhosis and ascites rx Contact information: [address] [facility] [place] [address] [phone]                  Sterling Big, MD FACS

## 2021-04-24 ENCOUNTER — Other Ambulatory Visit: Payer: Self-pay

## 2021-04-24 ENCOUNTER — Ambulatory Visit (INDEPENDENT_AMBULATORY_CARE_PROVIDER_SITE_OTHER): Payer: Commercial Managed Care - PPO | Admitting: Physician Assistant

## 2021-04-24 ENCOUNTER — Encounter: Payer: Self-pay | Admitting: Physician Assistant

## 2021-04-24 VITALS — BP 124/80 | HR 75 | Temp 98.2°F | Ht 67.0 in | Wt 193.2 lb

## 2021-04-24 DIAGNOSIS — Z09 Encounter for follow-up examination after completed treatment for conditions other than malignant neoplasm: Secondary | ICD-10-CM

## 2021-04-24 DIAGNOSIS — K8000 Calculus of gallbladder with acute cholecystitis without obstruction: Secondary | ICD-10-CM

## 2021-04-24 MED ORDER — OXYCODONE HCL 5 MG PO TABS: 5.0000 mg | ORAL_TABLET | ORAL | 0 refills | Status: DC | PRN

## 2021-04-24 MED ORDER — OXYCODONE HCL 5 MG PO TABS: 5.0000 mg | ORAL_TABLET | ORAL | 0 refills | Status: AC | PRN

## 2021-04-24 NOTE — Progress Notes (Signed)
[facility] POST-OP OFFICE VISIT  04/24/2021  HPI: Cameron [name] is a 60 y.o. male who is s/p laparoscopic cholecystectomy on 09/28 with Dr Lady Gary for acute calculous cholecystitis. Post-operative course was complicated by AKI requiring nephrology consultation and decompensated cirrhosis with high output drain. He was discharged home on 10/06 Lasix (20 mg BID) and spironolactone (25 mg BID). Ascites was being managed with urostomy bag.   Today, he reports that he is doing much better since discharge home. He has some abdominal soreness with movement but otherwise he is pain free. No fever, chills, nausea, emesis. He reports that his abdominal drainage has stopped and he has removed his urostomy bag. He is keeping the site covered with superficial dressings but there remains no output. He is due to see Gi in December for cirrhosis symptom management. Set to follow up with cardiology on Monday of next week   Vital signs: BP 124/80   Pulse 75   Temp 98.2 F (36.8 C) (Oral)   Ht 5\' 7"  (1.702 m)   Wt 193 lb 3.2 oz (87.6 kg)   SpO2 95%   BMI 30.26 kg/m    Physical Exam: Constitutional: Well appearing male, NAD Abdomen: Soft, non-tender, non-distended, no rebound/guarding. Previous drain site in the left abdomen is healing, no drianage Skin: Laparoscopic incisions are healed well MSK: He does have pitting edema to the level of the knee bilaterally but this is improved from the last time I saw him  Assessment/Plan: This is a 60 y.o. male who is s/p laparoscopic cholecystectomy on 09/28 complicated by decompensated cirrhosis and, resolved, AKI   - Pain control; refill oxycodone. We are very limited in alternatives given his liver disease and AKI in the hospital  - Reviewed wound care  - Reviewed lifting restrictions; work note given   - Follow up with GI as scheduled  - Follow up with cardiologist as scheduled  - I will be happy to see him in the interim as needed; He  understands to call with questions/concerns  -- 10/28, PA-C [facility] 04/24/2021, 3:54 PM [phone] M-F: 7am - 4pm

## 2021-04-24 NOTE — Patient Instructions (Addendum)
If you have any concerns or questions, please feel free to call our office.    GENERAL POST-OPERATIVE PATIENT INSTRUCTIONS   WOUND CARE INSTRUCTIONS:  Keep a dry clean dressing on the wound if there is drainage. The initial bandage may be removed after 24 hours.  Once the wound has quit draining you may leave it open to air.  If clothing rubs against the wound or causes irritation and the wound is not draining you may cover it with a dry dressing during the daytime.  Try to keep the wound dry and avoid ointments on the wound unless directed to do so.  If the wound becomes bright red and painful or starts to drain infected material that is not clear, please contact your physician immediately.  If the wound is mildly pink and has a thick firm ridge underneath it, this is normal, and is referred to as a healing ridge.  This will resolve over the next 4-6 weeks.  BATHING: You may shower if you have been informed of this by your surgeon. However, Please do not submerge in a tub, hot tub, or pool until incisions are completely sealed or have been told by your surgeon that you may do so.  DIET:  You may eat any foods that you can tolerate.  It is a good idea to eat a high fiber diet and take in plenty of fluids to prevent constipation.  If you do become constipated you may want to take a mild laxative or take ducolax tablets on a daily basis until your bowel habits are regular.  Constipation can be very uncomfortable, along with straining, after recent surgery.  ACTIVITY:  You are encouraged to cough and deep breath or use your incentive spirometer if you were given one, every 15-30 minutes when awake.  This will help prevent respiratory complications and low grade fevers post-operatively if you had a general anesthetic.  You may want to hug a pillow when coughing and sneezing to add additional support to the surgical area, if you had abdominal or chest surgery, which will decrease pain during these times.   You are encouraged to walk and engage in light activity for the next two weeks.  You should not lift more than 10-15 pounds, until 05/01/2021 as it could put you at increased risk for complications.  Twenty pounds is roughly equivalent to a plastic bag of groceries. At that time- Listen to your body when lifting, if you have pain when lifting, stop and then try again in a few days. Soreness after doing exercises or activities of daily living is normal as you get back in to your normal routine.  MEDICATIONS:  Try to take narcotic medications and anti-inflammatory medications, such as tylenol, ibuprofen, naprosyn, etc., with food.  This will minimize stomach upset from the medication.  Should you develop nausea and vomiting from the pain medication, or develop a rash, please discontinue the medication and contact your physician.  You should not drive, make important decisions, or operate machinery when taking narcotic pain medication.  SUNBLOCK Use sun block to incision area over the next year if this area will be exposed to sun. This helps decrease scarring and will allow you avoid a permanent darkened area over your incision.  05/01/2021     Gallbladder Eating Plan  If you have a gallbladder condition, you may have trouble digesting fats. Eating a low-fat diet can help reduce your symptoms, and may be helpful before and after having surgery to remove  your gallbladder (cholecystectomy). Your health care provider may recommend that you work with a diet and nutrition specialist (dietitian) to help you reduce the amount of fat in your diet. What are tips for following this plan? General guidelines Limit your fat intake to less than 30% of your total daily calories. If you eat around 1,800 calories each day, this is less than 60 grams (g) of fat per day. Fat is an important part of a healthy diet. Eating a low-fat diet can make it hard to maintain a healthy body weight. Ask your dietitian how much fat,  calories, and other nutrients you need each day. Eat small, frequent meals throughout the day instead of three large meals. Drink at least 8-10 cups of fluid a day. Drink enough fluid to keep your urine clear or pale yellow. Limit alcohol intake to no more than 1 drink a day for nonpregnant women and 2 drinks a day for men. One drink equals 12 oz of beer, 5 oz of wine, or 1 oz of hard liquor. Reading food labels  Check Nutrition Facts on food labels for the amount of fat per serving. Choose foods with less than 3 grams of fat per serving. Shopping Choose nonfat and low-fat healthy foods. Look for the words "nonfat," "low fat," or "fat free." Avoid buying processed or prepackaged foods. Cooking Cook using low-fat methods, such as baking, broiling, grilling, or boiling. Cook with small amounts of healthy fats, such as olive oil, grapeseed oil, canola oil, or sunflower oil. What foods are recommended? All fresh, frozen, or canned fruits and vegetables. Whole grains. Low-fat or non-fat (skim) milk and yogurt. Lean meat, skinless poultry, fish, eggs, and beans. Low-fat protein supplement powders or drinks. Spices and herbs. What foods are not recommended? High-fat foods. These include baked goods, fast food, fatty cuts of meat, ice cream, french toast, sweet rolls, pizza, cheese bread, foods covered with butter, creamy sauces, or cheese. Fried foods. These include french fries, tempura, battered fish, breaded chicken, fried breads, and sweets. Foods with strong odors. Foods that cause bloating and gas. Summary A low-fat diet can be helpful if you have a gallbladder condition, or before and after gallbladder surgery. Limit your fat intake to less than 30% of your total daily calories. This is about 60 g of fat if you eat 1,800 calories each day. Eat small, frequent meals throughout the day instead of three large meals. This information is not intended to replace advice given to you by your  health care provider. Make sure you discuss any questions you have with your health care provider. Document Revised: 02/09/2020 Document Reviewed: 02/09/2020 Elsevier Patient Education  2022 [employer].

## 2021-04-30 ENCOUNTER — Encounter (HOSPITAL_COMMUNITY): Payer: Self-pay | Admitting: Radiology

## 2021-05-29 ENCOUNTER — Other Ambulatory Visit: Payer: Self-pay

## 2021-05-29 ENCOUNTER — Other Ambulatory Visit
Admission: RE | Admit: 2021-05-29 | Discharge: 2021-05-29 | Disposition: A | Discharge status: Home / Self Care | Payer: Commercial Managed Care - PPO | Attending: Surgery | Admitting: Surgery

## 2021-05-29 ENCOUNTER — Ambulatory Visit (INDEPENDENT_AMBULATORY_CARE_PROVIDER_SITE_OTHER): Payer: Commercial Managed Care - PPO | Admitting: Surgery

## 2021-05-29 ENCOUNTER — Encounter: Payer: Self-pay | Admitting: Surgery

## 2021-05-29 VITALS — BP 110/75 | HR 80 | Temp 97.8°F | Ht 67.0 in | Wt 179.6 lb

## 2021-05-29 DIAGNOSIS — K8000 Calculus of gallbladder with acute cholecystitis without obstruction: Secondary | ICD-10-CM

## 2021-05-29 DIAGNOSIS — Z09 Encounter for follow-up examination after completed treatment for conditions other than malignant neoplasm: Secondary | ICD-10-CM

## 2021-05-29 DIAGNOSIS — K432 Incisional hernia without obstruction or gangrene: Secondary | ICD-10-CM

## 2021-05-29 LAB — COMPREHENSIVE METABOLIC PANEL
ALT: 24 U/L (ref 0–44)
AST: 26 U/L (ref 15–41)
Albumin: 4 g/dL (ref 3.5–5.0)
Alkaline Phosphatase: 113 U/L (ref 38–126)
Anion gap: 7 (ref 5–15)
BUN: 40 mg/dL — ABNORMAL HIGH (ref 6–20)
CO2: 25 mmol/L (ref 22–32)
Calcium: 9.1 mg/dL (ref 8.9–10.3)
Chloride: 107 mmol/L (ref 98–111)
Creatinine, Ser: 1.61 mg/dL — ABNORMAL HIGH (ref 0.61–1.24)
GFR, Estimated: 49 mL/min — ABNORMAL LOW (ref >60)
Glucose, Bld: 101 mg/dL — ABNORMAL HIGH (ref 70–99)
Potassium: 4.5 mmol/L (ref 3.5–5.1)
Sodium: 139 mmol/L (ref 135–145)
Total Bilirubin: 0.7 mg/dL (ref 0.3–1.2)
Total Protein: 7.6 g/dL (ref 6.5–8.1)

## 2021-05-29 LAB — CBC
HCT: 42.6 % (ref 39.0–52.0)
Hemoglobin: 13.2 g/dL (ref 13.0–17.0)
MCH: 30.4 pg (ref 26.0–34.0)
MCHC: 31 g/dL (ref 30.0–36.0)
MCV: 98.2 fL (ref 80.0–100.0)
Platelets: 170 10*3/uL (ref 150–400)
RBC: 4.34 MIL/uL (ref 4.22–5.81)
RDW: 15.7 % — ABNORMAL HIGH (ref 11.5–15.5)
WBC: 5.7 10*3/uL (ref 4.0–10.5)
nRBC: 0 % (ref 0.0–0.2)

## 2021-05-29 LAB — PROTIME-INR
INR: 1.1 (ref 0.8–1.2)
Prothrombin Time: 14.6 seconds (ref 11.4–15.2)

## 2021-05-29 NOTE — Patient Instructions (Addendum)
Your CT is scheduled for June 20, 2021 at the [facility] @ 8 am (arrive at 7:45 am). Nothing to eat or drink 4 hours prior. Pick up contrast between now and the day before at any [facility]. Drink 1st bottle at 6 am and the 2nd bottle 7 am on the morning of CT.   Please have your labs done at [facility]. You do not need an appointment.   If you have any concerns or questions, please feel free to call our office.  Hernia, Adult   A hernia happens when an organ or tissue inside your body pushes out through a weak spot in the muscles of your belly (abdomen). This makes a bulge. The bulge may be: In a scar from a surgery that was done in your belly (incisional hernia). Near your belly button (umbilical hernia). In your groin (inguinal hernia). Your groin is the area where your leg meets your lower belly. If you are a male, this type could also be in your scrotum. In your upper thigh (femoral hernia). Inside your belly (hiatal hernia). This happens when your stomach slides above the muscle between your belly and your chest (diaphragm). What are the causes? This condition may be caused by: Lifting heavy things. Coughing over a long period of time. Having trouble pooping (constipation). Trouble pooping can lead to straining. A cut from surgery in your belly. A physical problem that is present at birth. Being very overweight. Smoking. Too much fluid in your belly. A testicle that has not moved down into the scrotum, in males. What are the signs or symptoms? The main symptom is a bulge in the area of the hernia, but a bulge may not always be seen. It may grow bigger or be easier to see when you cough or strain (such as when lifting something heavy). A hernia that can be pushed back into the belly rarely causes pain. A hernia that cannot be pushed back into the belly may lose its blood supply. This may cause: Pain. Fever. A feeling like you may vomit, and  vomiting. Swelling. Trouble pooping. How is this treated? A hernia that is small and painless may not need to be treated. A hernia that is large or painful may be treated with surgery. Surgery to treat a hernia involves pushing the bulge back into place and repairing the weak area of the muscle or belly. Follow these instructions at home: Activity Avoid straining the muscles near your hernia. This can happen when you: Lift something heavy. Poop (have a bowel movement). Do not lift anything that is heavier than 10 lb (4.5 kg), or the limit that you are told. When you lift something heavy, use your leg muscles. Do not use your back muscles to lift. Prevent trouble pooping If told by your doctor, take steps to prevent trouble pooping. You may need to: Drink enough fluid to keep your pee (urine) pale yellow. Take medicines. You will be told what medicines to take. Eat foods that are high in fiber. These include beans, whole grains, and fresh fruits and vegetables. Limit foods that are high in fat and sugar. These include fried or sweet foods. General instructions When you cough, try to cough gently. You may try to push your hernia back in by gently pressing on it when you are lying down. Do not try to force the bulge back in if it will not go in easily. If you are overweight, work with your doctor to  lose weight safely. Do not smoke or use any products that contain nicotine or tobacco. If you need help quitting, ask your doctor. If you will be having surgery, watch your hernia for changes in shape, size, or color. Tell your doctor if you see any changes. Take over-the-counter and prescription medicines only as told by your doctor. Keep all follow-up visits. Contact a doctor if: You get new pain, swelling, or redness near your hernia. You poop fewer times in a week than normal. You have trouble pooping. You have poop that is more dry than normal. You have poop that is harder or larger than  normal. Get help right away if: You have a fever or chills. You have belly pain that gets worse. You feel like you may vomit, or you vomit. Your hernia cannot be pushed in by gently pressing on it when you are lying down. Your hernia: Changes in shape or size. Changes color. Feels hard, or it hurts when you touch it. These symptoms may be an emergency. Get help right away. Call your local emergency services (911 in the [place] Do not wait to see if the symptoms will go away. Do not drive yourself to the hospital. Summary A hernia happens when an organ or tissue inside your body pushes out through a weak spot in the belly muscles. This creates a bulge. If your hernia is small and it does not hurt, you may not need treatment. If your hernia is large or it hurts, you may need surgery. If you will be having surgery, watch your hernia for changes in shape, size, or color. Tell your doctor about any changes. This information is not intended to replace advice given to you by your health care provider. Make sure you discuss any questions you have with your health care provider. Document Revised: 01/30/2020 Document Reviewed: 01/30/2020 Elsevier Patient Education  2022 [employer].

## 2021-05-31 ENCOUNTER — Encounter: Payer: Self-pay | Admitting: Surgery

## 2021-05-31 NOTE — Progress Notes (Signed)
Cameron [name] is a 60 year old male with cirrhosis and heart failure had a robotic cholecystectomy by Dr. Lady Gary close to 2 months ago.  He developed significant hepatic acute injury following general anesthetic.  He has recovered and has diuresed very well.  Now commenced with a new bulge to the left of the abdominal wall.  No fevers or chills  PE NAD, chronically ill Abd: Soft nontender incisions healing well.  There is evidence of presumed chronically incarcerated left incisional hernia.  No peritonitis.  A/P we will obtain CT scan of the abdomen pelvis for presumed incisional hernia.  We will also obtain recent labs to include a new CMP and CBC to monitor his liver function.  RTC once work-up is completed

## 2021-06-03 ENCOUNTER — Telehealth: Payer: Self-pay

## 2021-06-03 NOTE — Telephone Encounter (Signed)
Spoke with patient regarding labs- patient reminded to keep follow up with Nephrologist.

## 2021-06-20 ENCOUNTER — Ambulatory Visit
Admission: RE | Admit: 2021-06-20 | Discharge: 2021-06-20 | Disposition: A | Discharge status: Home / Self Care | Payer: Commercial Managed Care - PPO | Source: Ambulatory Visit | Attending: Surgery | Admitting: Surgery

## 2021-06-20 ENCOUNTER — Other Ambulatory Visit: Payer: Self-pay

## 2021-06-20 DIAGNOSIS — K432 Incisional hernia without obstruction or gangrene: Secondary | ICD-10-CM | POA: Insufficient documentation

## 2021-06-20 MED ORDER — IOHEXOL 300 MG/ML  SOLN
75.0000 mL | Freq: Once | INTRAMUSCULAR | Status: AC | PRN
  Administered 2021-06-20: 75 mL via INTRAVENOUS

## 2021-06-21 ENCOUNTER — Telehealth: Payer: Self-pay

## 2021-06-21 NOTE — Telephone Encounter (Signed)
Received call from [facility]- please see CT results- Dr.Pabon notified. Patient scheduled 06/27/21 @ 9:30 am.   Left detailed message for patient letting him know CT scan confirms that he has hernia and no other surprises per Dr.Pabon-keep scheduled appointment listed above.

## 2021-06-25 ENCOUNTER — Other Ambulatory Visit: Payer: Self-pay | Admitting: Gastroenterology

## 2021-06-25 DIAGNOSIS — K7030 Alcoholic cirrhosis of liver without ascites: Secondary | ICD-10-CM

## 2021-06-26 ENCOUNTER — Ambulatory Visit: Payer: Commercial Managed Care - PPO | Admitting: Surgery

## 2021-07-07 ENCOUNTER — Emergency Department
Admission: EM | Admit: 2021-07-07 | Discharge: 2021-07-07 | Disposition: A | Discharge status: Home / Self Care | Payer: Commercial Managed Care - PPO | Attending: Emergency Medicine | Admitting: Emergency Medicine

## 2021-07-07 ENCOUNTER — Encounter: Payer: Self-pay | Admitting: Emergency Medicine

## 2021-07-07 ENCOUNTER — Emergency Department: Payer: Commercial Managed Care - PPO

## 2021-07-07 ENCOUNTER — Other Ambulatory Visit: Payer: Self-pay

## 2021-07-07 DIAGNOSIS — Z7901 Long term (current) use of anticoagulants: Secondary | ICD-10-CM | POA: Diagnosis not present

## 2021-07-07 DIAGNOSIS — Z720 Tobacco use: Secondary | ICD-10-CM

## 2021-07-07 DIAGNOSIS — R079 Chest pain, unspecified: Secondary | ICD-10-CM | POA: Diagnosis present

## 2021-07-07 DIAGNOSIS — I110 Hypertensive heart disease with heart failure: Secondary | ICD-10-CM | POA: Insufficient documentation

## 2021-07-07 DIAGNOSIS — F17200 Nicotine dependence, unspecified, uncomplicated: Secondary | ICD-10-CM | POA: Diagnosis not present

## 2021-07-07 DIAGNOSIS — I2510 Atherosclerotic heart disease of native coronary artery without angina pectoris: Secondary | ICD-10-CM | POA: Diagnosis not present

## 2021-07-07 DIAGNOSIS — I509 Heart failure, unspecified: Secondary | ICD-10-CM | POA: Diagnosis not present

## 2021-07-07 LAB — TROPONIN I (HIGH SENSITIVITY)
Troponin I (High Sensitivity): 27 ng/L — ABNORMAL HIGH (ref <18)
Troponin I (High Sensitivity): 25 ng/L — ABNORMAL HIGH (ref <18)

## 2021-07-07 LAB — CBC
HCT: 42.8 % (ref 39.0–52.0)
Hemoglobin: 13.6 g/dL (ref 13.0–17.0)
MCH: 31.6 pg (ref 26.0–34.0)
MCHC: 31.8 g/dL (ref 30.0–36.0)
MCV: 99.5 fL (ref 80.0–100.0)
Platelets: 155 10*3/uL (ref 150–400)
RBC: 4.3 MIL/uL (ref 4.22–5.81)
RDW: 16.6 % — ABNORMAL HIGH (ref 11.5–15.5)
WBC: 8.1 10*3/uL (ref 4.0–10.5)
nRBC: 0 % (ref 0.0–0.2)

## 2021-07-07 LAB — HEPATIC FUNCTION PANEL
ALT: 8 U/L (ref 0–44)
AST: 15 U/L (ref 15–41)
Albumin: 3.6 g/dL (ref 3.5–5.0)
Alkaline Phosphatase: 112 U/L (ref 38–126)
Bilirubin, Direct: 0.3 mg/dL — ABNORMAL HIGH (ref 0.0–0.2)
Indirect Bilirubin: 1.5 mg/dL — ABNORMAL HIGH (ref 0.3–0.9)
Total Bilirubin: 1.8 mg/dL — ABNORMAL HIGH (ref 0.3–1.2)
Total Protein: 7 g/dL (ref 6.5–8.1)

## 2021-07-07 LAB — BASIC METABOLIC PANEL
Anion gap: 9 (ref 5–15)
BUN: 20 mg/dL (ref 6–20)
CO2: 28 mmol/L (ref 22–32)
Calcium: 8.7 mg/dL — ABNORMAL LOW (ref 8.9–10.3)
Chloride: 96 mmol/L — ABNORMAL LOW (ref 98–111)
Creatinine, Ser: 1.49 mg/dL — ABNORMAL HIGH (ref 0.61–1.24)
GFR, Estimated: 54 mL/min — ABNORMAL LOW (ref >60)
Glucose, Bld: 122 mg/dL — ABNORMAL HIGH (ref 70–99)
Potassium: 3.7 mmol/L (ref 3.5–5.1)
Sodium: 133 mmol/L — ABNORMAL LOW (ref 135–145)

## 2021-07-07 LAB — LIPASE, BLOOD: Lipase: 30 U/L (ref 11–51)

## 2021-07-07 LAB — BRAIN NATRIURETIC PEPTIDE: B Natriuretic Peptide: 1173.2 pg/mL — ABNORMAL HIGH (ref 0.0–100.0)

## 2021-07-07 MED ORDER — NITROGLYCERIN 0.4 MG SL SUBL
0.4000 mg | SUBLINGUAL_TABLET | SUBLINGUAL | Status: DC | PRN
  Administered 2021-07-07: 0.4 mg via SUBLINGUAL
  Filled 2021-07-07: qty 1

## 2021-07-07 MED ORDER — PANTOPRAZOLE SODIUM 40 MG PO TBEC
40.0000 mg | DELAYED_RELEASE_TABLET | Freq: Once | ORAL | Status: AC
  Administered 2021-07-07: 40 mg via ORAL
  Filled 2021-07-07: qty 1

## 2021-07-07 MED ORDER — SUCRALFATE 1 G PO TABS
1.0000 g | ORAL_TABLET | Freq: Once | ORAL | Status: AC
  Administered 2021-07-07: 1 g via ORAL
  Filled 2021-07-07: qty 1

## 2021-07-07 MED ORDER — ALUM & MAG HYDROXIDE-SIMETH 200-200-20 MG/5ML PO SUSP
15.0000 mL | Freq: Once | ORAL | Status: AC
  Administered 2021-07-07: 15 mL via ORAL
  Filled 2021-07-07: qty 30

## 2021-07-07 MED ORDER — PANTOPRAZOLE SODIUM 40 MG PO TBEC: 40.0000 mg | DELAYED_RELEASE_TABLET | Freq: Every day | ORAL | 0 refills | Status: DC

## 2021-07-07 MED ORDER — SUCRALFATE 1 G PO TABS: 1.0000 g | ORAL_TABLET | Freq: Three times a day (TID) | ORAL | 0 refills | Status: DC

## 2021-07-07 MED ORDER — ACETAMINOPHEN 500 MG PO TABS
1000.0000 mg | ORAL_TABLET | Freq: Once | ORAL | Status: AC
  Administered 2021-07-07: 1000 mg via ORAL
  Filled 2021-07-07: qty 2

## 2021-07-07 NOTE — ED Provider Notes (Signed)
[facility] Provider Note    Event Date/Time   First MD Initiated Contact with Patient 07/07/21 1146     (approximate)   History   Chest Pain   HPI  Cameron [name] is a 61 y.o. male with past medical history of tobacco abuse, EtOH abuse drinking about 1 pint of wine per day, CAD status post STEMI with PCI and DES to P LAD, known left atrial thrombus currently anticoagulated on Eliquis, HTN, HDL, CHF (on 20 mg of Lasix daily with most recent echo in August of last year showing EF less than 20%), and PAD who presents for assessment of chest pain.  Patient states he noticed that earlier this morning after initially falling asleep.  He described as a sharp and worsened with movement of the upper extremities or twisting.  He says he thinks it is related to possibly some reflux he is having.  He also notes he was carrying a lot of firewood yesterday.  He denies any cough, shortness of breath, fevers, headache, earache, sore throat, vomiting, diarrhea, burning with urination, rash or extremity pain.  He has not taken any of his medicine this morning but otherwise is compliant with all his medications.      Physical Exam  Triage Vital Signs: ED Triage Vitals  Enc Vitals Group     BP 07/07/21 1030 112/76     Pulse Rate 07/07/21 1030 81     Resp 07/07/21 1030 20     Temp 07/07/21 1030 98.5 F (36.9 C)     Temp Source 07/07/21 1030 Oral     SpO2 07/07/21 1030 98 %     Weight 07/07/21 1029 185 lb (83.9 kg)     Height 07/07/21 1029 5\' 7"  (1.702 m)     Head Circumference --      Peak Flow --      Pain Score 07/07/21 1028 8     Pain Loc --      Pain Edu? --      Excl. in GC? --     Most recent vital signs: Vitals:   07/07/21 1215 07/07/21 1238  BP: (!) 99/57 108/79  Pulse: 80 (!) 102  Resp: 18 18  Temp:    SpO2: 98% 99%     General: Awake, no distress.  CV:  Good peripheral perfusion.  2+ radial pulses.  Slight systolic murmur. Resp:  Normal effort.   Clear bilaterally. Abd:  No distention.  Other:  There is some tenderness palpation over the costochondral joints on the left.  Patient's pain is reproducible rotation of the bilateral upper extremities.  There is no overlying skin changes over the patient's chest.   ED Results / Procedures / Treatments  Labs (all labs ordered are listed, but only abnormal results are displayed) Labs Reviewed  BASIC METABOLIC PANEL - Abnormal; Notable for the following components:      Result Value   Sodium 133 (*)    Chloride 96 (*)    Glucose, Bld 122 (*)    Creatinine, Ser 1.49 (*)    Calcium 8.7 (*)    GFR, Estimated 54 (*)    All other components within normal limits  CBC - Abnormal; Notable for the following components:   RDW 16.6 (*)    All other components within normal limits  BRAIN NATRIURETIC PEPTIDE - Abnormal; Notable for the following components:   B Natriuretic Peptide 1,173.2 (*)    All other components within normal limits  HEPATIC FUNCTION PANEL - Abnormal; Notable for the following components:   Total Bilirubin 1.8 (*)    Bilirubin, Direct 0.3 (*)    Indirect Bilirubin 1.5 (*)    All other components within normal limits  TROPONIN I (HIGH SENSITIVITY) - Abnormal; Notable for the following components:   Troponin I (High Sensitivity) 27 (*)    All other components within normal limits  TROPONIN I (HIGH SENSITIVITY) - Abnormal; Notable for the following components:   Troponin I (High Sensitivity) 25 (*)    All other components within normal limits  LIPASE, BLOOD     EKG  A flutter with variable block with a ventricular rate of 77 and some nonspecific changes in inferior and anterior leads.   RADIOLOGY Chest x-ray reviewed by myself shows cardiac megaly with pulmonary vascular congestion.  No focal consolidation, effusion, edema, pneumothorax or other clear acute process.  I also reviewed patient's radiology report which confirmed these  findings.    PROCEDURES:  Critical Care performed: No  Procedures    MEDICATIONS ORDERED IN ED: Medications  nitroGLYCERIN (NITROSTAT) SL tablet 0.4 mg (0.4 mg Sublingual Given 07/07/21 1206)  acetaminophen (TYLENOL) tablet 1,000 mg (1,000 mg Oral Given 07/07/21 1206)  alum & mag hydroxide-simeth (MAALOX/MYLANTA) 200-200-20 MG/5ML suspension 15 mL (15 mLs Oral Given 07/07/21 1206)  sucralfate (CARAFATE) tablet 1 g (1 g Oral Given 07/07/21 1206)  pantoprazole (PROTONIX) EC tablet 40 mg (40 mg Oral Given 07/07/21 1206)     IMPRESSION / MDM / ASSESSMENT AND PLAN / ED COURSE  I reviewed the triage vital signs and the nursing notes.                              Differential diagnosis includes, but is not limited to, ACS, pneumonia, bronchitis, gastritis and esophagitis, pancreatitis, anemia, metabolic derangements with a lower suspicion for PE at this time given patient states he is compliant with his Eliquis.  In addition I have low suspicion for dissection at this time given pain is very reproducible and seems to have, however hours and is not described as ripping or tearing.  Chest x-ray shows some mild pulmonary congestion but no evidence of pneumonia, pneumothorax or other clear acute thoracic process.  Patient denies any fever or cough and is not hypoxic and given absence of leukocytosis I have low suspicion for acute bacterial process.  Troponins minimally elevated 2725 which is down from patient's most recently check troponins 3 months ago whenever 30 and 30.  Given otherwise appropriately rate controlled flutter with downtrending troponins and patient stating he is feeling much better after GI cocktail for low suspicion for primary cardiac etiology today.  While he does have an elevated BNP at 1173 this is lower than it was last checked at 1973 months ago.  In addition patient is not tachycardic hypoxic or tachypneic on my initial assessment and given he is denying any shortness of breath do  not believe he has significant acute heart failure exacerbation.  I think he is stable from this perspective for outpatient follow-up.  Lipase is not consistent with pancreatitis.  Hepatic function panel and absence of tenderness in the right upper quadrant are not suggestive of atypical presentation for cholecystitis.  Given patient states he is feeling much better after GI cocktail and some Tylenol states he is actually feeling better ambulating around his room otherwise reassuring exam work-up I think he is stable for discharge I have  a low suspicion for immediate life-threatening process.  Counseled on tobacco abuse and cessation as well as moderation of his drinking.  Will write Rx for short course of Carafate and Protonix.  Discharged in stable condition.       FINAL CLINICAL IMPRESSION(S) / ED DIAGNOSES   Final diagnoses:  Chest pain, unspecified type  Anticoagulated  Tobacco abuse     Rx / DC Orders   ED Discharge Orders          Ordered    pantoprazole (PROTONIX) 40 MG tablet  Daily        07/07/21 1306    sucralfate (CARAFATE) 1 g tablet  3 times daily with meals & bedtime        07/07/21 1306             Note:  This document was prepared using Dragon voice recognition software and may include unintentional dictation errors.   Gilles Chiquito, MD 07/07/21 [phone]

## 2021-07-07 NOTE — ED Notes (Signed)
Pt reports he still has a "rumbling " feeling in left chest

## 2021-07-07 NOTE — ED Triage Notes (Signed)
Pt reports awoke this am with sharp pain to his left chest. Pt reports when he rubs it the pain is burning. Pt reports significant cardiac hx as well. Pt denies pain that radiates or SOB or nausea

## 2021-10-15 IMAGING — DX DG CHEST 1V PORT
1 series · 1 of 1 positions shown · non-contrast
Comparison: One-view chest x-ray 02/17/2020

CLINICAL DATA: Shortness of breath.

EXAM:
PORTABLE CHEST 1 VIEW

[chest ap]
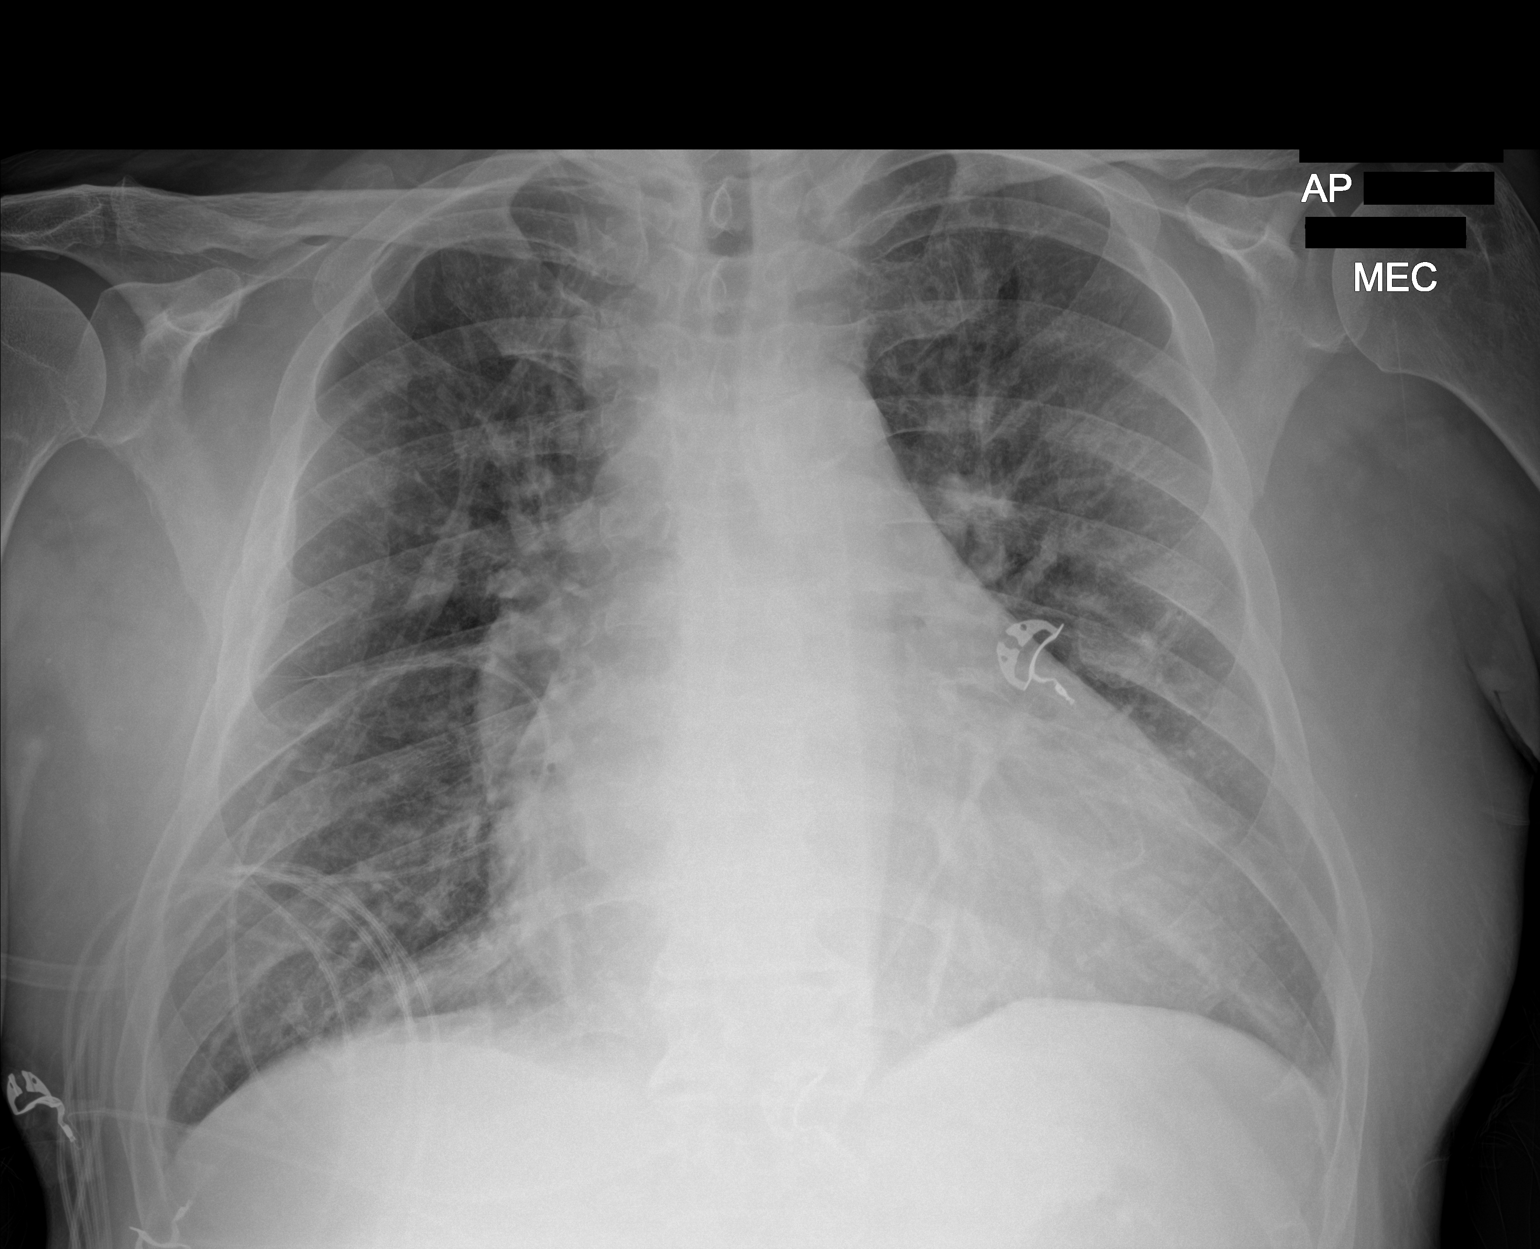

[1 of 1 positions shown; findings below may reference images not displayed]

FINDINGS: Heart is enlarged. Mild to moderate pulmonary vascular congestion is
present. No significant edema is present. Mild atelectasis is
present at the bases.
IMPRESSION: Cardiomegaly and mild to moderate pulmonary vascular congestion
without frank edema.

## 2021-12-24 ENCOUNTER — Ambulatory Visit: Admission: RE | Admit: 2021-12-24 | Payer: Commercial Managed Care - PPO | Source: Ambulatory Visit

## 2022-06-27 ENCOUNTER — Emergency Department: Payer: BLUE CROSS/BLUE SHIELD

## 2022-06-27 ENCOUNTER — Other Ambulatory Visit: Payer: Self-pay

## 2022-06-27 ENCOUNTER — Emergency Department
Admission: EM | Admit: 2022-06-27 | Discharge: 2022-06-27 | Disposition: A | Discharge status: Home / Self Care | Payer: BLUE CROSS/BLUE SHIELD | Attending: Emergency Medicine | Admitting: Emergency Medicine

## 2022-06-27 DIAGNOSIS — M79672 Pain in left foot: Secondary | ICD-10-CM | POA: Insufficient documentation

## 2022-06-27 MED ORDER — INDOMETHACIN 50 MG PO CAPS: 50.0000 mg | ORAL_CAPSULE | Freq: Three times a day (TID) | ORAL | 0 refills | Status: DC

## 2022-06-27 MED ORDER — PREDNISONE 20 MG PO TABS: 40.0000 mg | ORAL_TABLET | Freq: Every day | ORAL | 0 refills | Status: AC

## 2022-06-27 MED ORDER — SULFAMETHOXAZOLE-TRIMETHOPRIM 800-160 MG PO TABS: 1.0000 | ORAL_TABLET | Freq: Two times a day (BID) | ORAL | 0 refills | Status: AC

## 2022-06-27 MED ORDER — INDOMETHACIN 50 MG PO CAPS: 50.0000 mg | ORAL_CAPSULE | Freq: Three times a day (TID) | ORAL | 0 refills | Status: AC | PRN

## 2022-06-27 NOTE — ED Notes (Signed)
E signature pad not working. Pt educated on discharge instructions and verbalized understanding.  

## 2022-06-27 NOTE — Discharge Instructions (Signed)
Please seek medical attention for any high fevers, chest pain, shortness of breath, change in behavior, persistent vomiting, bloody stool or any other new or concerning symptoms.  

## 2022-06-27 NOTE — ED Triage Notes (Signed)
Pt to ED via POV from home. Pt reports left foot pain due to gout flare up. Pt reports pain has not been controlled. Pt drove here but called ER secretary for assistance out of truck.

## 2022-06-27 NOTE — ED Provider Notes (Signed)
[facility] Provider Note    Event Date/Time   First MD Initiated Contact with Patient 06/27/22 1625     (approximate)   History   Left foot pain   HPI  Cameron [name] is a 61 y.o. male who presents to the emergency department today with continued left foot pain.  The pain started about a week ago when the patient woke up.  He does have a history of gout so thought that might be what it was initially.  Went to walk-in clinic where he was given prescription for steroids, allopurinol and narcotic pain medication.  Even taking the steroids and pain medication he states he has not noticed any improvement.  In fact he feels like it is gotten worse.  He denies any trauma to his foot.  Says that his last gout attack was a few months ago in his left knee.  Typically a course of prednisone will help his symptoms.     Physical Exam   Triage Vital Signs: ED Triage Vitals  Enc Vitals Group     BP 06/27/22 1601 120/72     Pulse Rate 06/27/22 1601 (!) 55     Resp 06/27/22 1601 16     Temp 06/27/22 1601 98.5 F (36.9 C)     Temp src --      SpO2 06/27/22 1601 94 %     Weight --      Height --      Head Circumference --      Peak Flow --      Pain Score 06/27/22 1603 10     Pain Loc --      Pain Edu? --      Excl. in GC? --     Most recent vital signs: Vitals:   06/27/22 1601  BP: 120/72  Pulse: (!) 55  Resp: 16  Temp: 98.5 F (36.9 C)  SpO2: 94%   General: Awake, alert, oriented. CV:  Good peripheral perfusion.  Resp:  Normal effort.  Abd:  No distention.  Other:  Erythema and swelling to the dorsal surface. Tender to palpation of the midfoot.    ED Results / Procedures / Treatments   Labs (all labs ordered are listed, but only abnormal results are displayed) Labs Reviewed - No data to display   EKG  None   RADIOLOGY I independently interpreted and visualized the left foot x-ray. My interpretation: No acute abnormality Radiology  interpretation:  IMPRESSION:  1. No fracture or dislocation of the left foot. Joint spaces are  preserved.  2. Diffuse soft tissue edema about the foot and ankle.      PROCEDURES:  Critical Care performed: No  Procedures   MEDICATIONS ORDERED IN ED: Medications - No data to display   IMPRESSION / MDM / ASSESSMENT AND PLAN / ED COURSE  I reviewed the triage vital signs and the nursing notes.                              Differential diagnosis includes, but is not limited to, gout, infection, fracture.  Patient's presentation is most consistent with acute presentation with potential threat to life or bodily function.  Patient presented to the emergency department today because of concerns for left foot pain.  Patient has history of gout and has been on steroids without any significant relief.  On exam patient does have some erythema and warmth to the  dorsum of his left foot.  His midfoot is tender.  No significant tenderness at the base of the great toe.  Did obtain an x-ray to evaluate for possible fracture.  This was negative.  This time do think gout is possible however would also have some concern for infection.  Discussed with patient.  Will plan on discharging with further steroids, antibiotics and indomethacin. Will give patient podiatry follow up information.  FINAL CLINICAL IMPRESSION(S) / ED DIAGNOSES   Final diagnoses:  Left foot pain    Note:  This document was prepared using Dragon voice recognition software and may include unintentional dictation errors.    Phineas Semen, MD 06/27/22 [phone]

## 2022-07-17 ENCOUNTER — Emergency Department: Payer: BLUE CROSS/BLUE SHIELD

## 2022-07-17 ENCOUNTER — Other Ambulatory Visit: Payer: Self-pay

## 2022-07-17 ENCOUNTER — Inpatient Hospital Stay
Admission: EM | Admit: 2022-07-17 | Discharge: 2022-07-26 | DRG: 291 | Disposition: A | Discharge status: Home Health | Payer: BLUE CROSS/BLUE SHIELD | Attending: Internal Medicine | Admitting: Internal Medicine

## 2022-07-17 ENCOUNTER — Inpatient Hospital Stay: Payer: BLUE CROSS/BLUE SHIELD

## 2022-07-17 DIAGNOSIS — I10 Essential (primary) hypertension: Secondary | ICD-10-CM | POA: Diagnosis not present

## 2022-07-17 DIAGNOSIS — D72829 Elevated white blood cell count, unspecified: Secondary | ICD-10-CM | POA: Diagnosis not present

## 2022-07-17 DIAGNOSIS — Z8249 Family history of ischemic heart disease and other diseases of the circulatory system: Secondary | ICD-10-CM

## 2022-07-17 DIAGNOSIS — Z833 Family history of diabetes mellitus: Secondary | ICD-10-CM

## 2022-07-17 DIAGNOSIS — Z79899 Other long term (current) drug therapy: Secondary | ICD-10-CM | POA: Diagnosis not present

## 2022-07-17 DIAGNOSIS — I5043 Acute on chronic combined systolic (congestive) and diastolic (congestive) heart failure: Secondary | ICD-10-CM | POA: Diagnosis present

## 2022-07-17 DIAGNOSIS — M1712 Unilateral primary osteoarthritis, left knee: Secondary | ICD-10-CM | POA: Diagnosis present

## 2022-07-17 DIAGNOSIS — I5023 Acute on chronic systolic (congestive) heart failure: Secondary | ICD-10-CM | POA: Diagnosis present

## 2022-07-17 DIAGNOSIS — T380X5A Adverse effect of glucocorticoids and synthetic analogues, initial encounter: Secondary | ICD-10-CM | POA: Diagnosis present

## 2022-07-17 DIAGNOSIS — M1A9XX1 Chronic gout, unspecified, with tophus (tophi): Secondary | ICD-10-CM | POA: Diagnosis present

## 2022-07-17 DIAGNOSIS — I480 Paroxysmal atrial fibrillation: Secondary | ICD-10-CM | POA: Diagnosis present

## 2022-07-17 DIAGNOSIS — Z7901 Long term (current) use of anticoagulants: Secondary | ICD-10-CM | POA: Diagnosis not present

## 2022-07-17 DIAGNOSIS — I255 Ischemic cardiomyopathy: Secondary | ICD-10-CM | POA: Diagnosis present

## 2022-07-17 DIAGNOSIS — K649 Unspecified hemorrhoids: Secondary | ICD-10-CM | POA: Diagnosis not present

## 2022-07-17 DIAGNOSIS — Z87448 Personal history of other diseases of urinary system: Secondary | ICD-10-CM | POA: Diagnosis not present

## 2022-07-17 DIAGNOSIS — S83242A Other tear of medial meniscus, current injury, left knee, initial encounter: Secondary | ICD-10-CM | POA: Diagnosis present

## 2022-07-17 DIAGNOSIS — K625 Hemorrhage of anus and rectum: Secondary | ICD-10-CM | POA: Diagnosis not present

## 2022-07-17 DIAGNOSIS — I2510 Atherosclerotic heart disease of native coronary artery without angina pectoris: Secondary | ICD-10-CM | POA: Diagnosis present

## 2022-07-17 DIAGNOSIS — K219 Gastro-esophageal reflux disease without esophagitis: Secondary | ICD-10-CM | POA: Diagnosis present

## 2022-07-17 DIAGNOSIS — I739 Peripheral vascular disease, unspecified: Secondary | ICD-10-CM | POA: Diagnosis present

## 2022-07-17 DIAGNOSIS — I5022 Chronic systolic (congestive) heart failure: Secondary | ICD-10-CM

## 2022-07-17 DIAGNOSIS — Z7984 Long term (current) use of oral hypoglycemic drugs: Secondary | ICD-10-CM

## 2022-07-17 DIAGNOSIS — I509 Heart failure, unspecified: Secondary | ICD-10-CM | POA: Diagnosis present

## 2022-07-17 DIAGNOSIS — R509 Fever, unspecified: Secondary | ICD-10-CM | POA: Diagnosis not present

## 2022-07-17 DIAGNOSIS — R262 Difficulty in walking, not elsewhere classified: Secondary | ICD-10-CM | POA: Diagnosis present

## 2022-07-17 DIAGNOSIS — M109 Gout, unspecified: Secondary | ICD-10-CM | POA: Diagnosis not present

## 2022-07-17 DIAGNOSIS — I219 Acute myocardial infarction, unspecified: Secondary | ICD-10-CM | POA: Diagnosis not present

## 2022-07-17 DIAGNOSIS — Z955 Presence of coronary angioplasty implant and graft: Secondary | ICD-10-CM

## 2022-07-17 DIAGNOSIS — F17210 Nicotine dependence, cigarettes, uncomplicated: Secondary | ICD-10-CM | POA: Diagnosis present

## 2022-07-17 DIAGNOSIS — R310 Gross hematuria: Secondary | ICD-10-CM | POA: Diagnosis not present

## 2022-07-17 DIAGNOSIS — Z803 Family history of malignant neoplasm of breast: Secondary | ICD-10-CM

## 2022-07-17 DIAGNOSIS — E876 Hypokalemia: Secondary | ICD-10-CM | POA: Diagnosis present

## 2022-07-17 DIAGNOSIS — Z820 Family history of epilepsy and other diseases of the nervous system: Secondary | ICD-10-CM

## 2022-07-17 DIAGNOSIS — D72828 Other elevated white blood cell count: Secondary | ICD-10-CM | POA: Diagnosis present

## 2022-07-17 DIAGNOSIS — E785 Hyperlipidemia, unspecified: Secondary | ICD-10-CM | POA: Diagnosis present

## 2022-07-17 DIAGNOSIS — Z720 Tobacco use: Secondary | ICD-10-CM | POA: Diagnosis present

## 2022-07-17 DIAGNOSIS — I110 Hypertensive heart disease with heart failure: Secondary | ICD-10-CM | POA: Diagnosis present

## 2022-07-17 DIAGNOSIS — K644 Residual hemorrhoidal skin tags: Secondary | ICD-10-CM | POA: Diagnosis present

## 2022-07-17 DIAGNOSIS — I252 Old myocardial infarction: Secondary | ICD-10-CM

## 2022-07-17 LAB — CBG MONITORING, ED: Glucose-Capillary: 102 mg/dL — ABNORMAL HIGH (ref 70–99)

## 2022-07-17 LAB — URINALYSIS, ROUTINE W REFLEX MICROSCOPIC
Bilirubin Urine: NEGATIVE
Glucose, UA: 150 mg/dL — AB
Ketones, ur: 5 mg/dL — AB
Leukocytes,Ua: NEGATIVE
Nitrite: NEGATIVE
Protein, ur: 100 mg/dL — AB
RBC / HPF: >50 RBC/hpf — ABNORMAL HIGH (ref 0–5)
Specific Gravity, Urine: 1.02 (ref 1.005–1.030)
pH: 5 (ref 5.0–8.0)

## 2022-07-17 LAB — SYNOVIAL CELL COUNT + DIFF, W/ CRYSTALS
Eosinophils-Synovial: 0 %
Lymphocytes-Synovial Fld: 0 %
Monocyte-Macrophage-Synovial Fluid: 1 %
Neutrophil, Synovial: 99 %
WBC, Synovial: 48319 /mm3 — ABNORMAL HIGH (ref 0–200)

## 2022-07-17 LAB — BASIC METABOLIC PANEL
Anion gap: 15 (ref 5–15)
BUN: 13 mg/dL (ref 6–20)
CO2: 25 mmol/L (ref 22–32)
Calcium: 9 mg/dL (ref 8.9–10.3)
Chloride: 96 mmol/L — ABNORMAL LOW (ref 98–111)
Creatinine, Ser: 1.3 mg/dL — ABNORMAL HIGH (ref 0.61–1.24)
GFR, Estimated: >60 mL/min (ref >60)
Glucose, Bld: 97 mg/dL (ref 70–99)
Potassium: 3.3 mmol/L — ABNORMAL LOW (ref 3.5–5.1)
Sodium: 136 mmol/L (ref 135–145)

## 2022-07-17 LAB — CBC
HCT: 41.1 % (ref 39.0–52.0)
Hemoglobin: 12.6 g/dL — ABNORMAL LOW (ref 13.0–17.0)
MCH: 29 pg (ref 26.0–34.0)
MCHC: 30.7 g/dL (ref 30.0–36.0)
MCV: 94.7 fL (ref 80.0–100.0)
Platelets: 270 10*3/uL (ref 150–400)
RBC: 4.34 MIL/uL (ref 4.22–5.81)
RDW: 18.9 % — ABNORMAL HIGH (ref 11.5–15.5)
WBC: 18.2 10*3/uL — ABNORMAL HIGH (ref 4.0–10.5)
nRBC: 0 % (ref 0.0–0.2)

## 2022-07-17 LAB — HEPATIC FUNCTION PANEL
ALT: 8 U/L (ref 0–44)
AST: 19 U/L (ref 15–41)
Albumin: 3.3 g/dL — ABNORMAL LOW (ref 3.5–5.0)
Alkaline Phosphatase: 105 U/L (ref 38–126)
Bilirubin, Direct: 0.5 mg/dL — ABNORMAL HIGH (ref 0.0–0.2)
Indirect Bilirubin: 1.3 mg/dL — ABNORMAL HIGH (ref 0.3–0.9)
Total Bilirubin: 1.8 mg/dL — ABNORMAL HIGH (ref 0.3–1.2)
Total Protein: 8 g/dL (ref 6.5–8.1)

## 2022-07-17 LAB — BRAIN NATRIURETIC PEPTIDE: B Natriuretic Peptide: 1911.6 pg/mL — ABNORMAL HIGH (ref 0.0–100.0)

## 2022-07-17 LAB — LACTIC ACID, PLASMA
Lactic Acid, Venous: 1.6 mmol/L (ref 0.5–1.9)
Lactic Acid, Venous: 2.2 mmol/L (ref 0.5–1.9)

## 2022-07-17 LAB — URIC ACID: Uric Acid, Serum: 8.1 mg/dL (ref 3.7–8.6)

## 2022-07-17 LAB — SEDIMENTATION RATE: Sed Rate: 60 mm/hr — ABNORMAL HIGH (ref 0–20)

## 2022-07-17 LAB — MAGNESIUM: Magnesium: 2.2 mg/dL (ref 1.7–2.4)

## 2022-07-17 MED ORDER — SPIRONOLACTONE 25 MG PO TABS
25.0000 mg | ORAL_TABLET | Freq: Two times a day (BID) | ORAL | Status: DC
  Administered 2022-07-17 – 2022-07-26 (×17): 25 mg via ORAL
  Filled 2022-07-17 (×17): qty 1

## 2022-07-17 MED ORDER — ACETAMINOPHEN 650 MG RE SUPP: 650.0000 mg | Freq: Four times a day (QID) | RECTAL | Status: AC | PRN

## 2022-07-17 MED ORDER — ACETAMINOPHEN 325 MG PO TABS
650.0000 mg | ORAL_TABLET | Freq: Four times a day (QID) | ORAL | Status: AC | PRN
  Administered 2022-07-19 – 2022-07-21 (×4): 650 mg via ORAL
  Filled 2022-07-17 (×4): qty 2

## 2022-07-17 MED ORDER — OXYCODONE-ACETAMINOPHEN 5-325 MG PO TABS
1.0000 | ORAL_TABLET | Freq: Four times a day (QID) | ORAL | Status: AC | PRN
  Administered 2022-07-19 – 2022-07-20 (×4): 1 via ORAL
  Filled 2022-07-17 (×4): qty 1

## 2022-07-17 MED ORDER — APIXABAN 5 MG PO TABS
5.0000 mg | ORAL_TABLET | Freq: Two times a day (BID) | ORAL | Status: DC
  Administered 2022-07-17 – 2022-07-24 (×15): 5 mg via ORAL
  Filled 2022-07-17 (×16): qty 1

## 2022-07-17 MED ORDER — NICOTINE 14 MG/24HR TD PT24: 14.0000 mg | MEDICATED_PATCH | Freq: Every day | TRANSDERMAL | Status: DC | PRN

## 2022-07-17 MED ORDER — KETOROLAC TROMETHAMINE 15 MG/ML IJ SOLN
15.0000 mg | Freq: Four times a day (QID) | INTRAMUSCULAR | Status: AC | PRN
  Administered 2022-07-17: 15 mg via INTRAVENOUS
  Filled 2022-07-17: qty 1

## 2022-07-17 MED ORDER — CARVEDILOL 3.125 MG PO TABS
3.1250 mg | ORAL_TABLET | Freq: Two times a day (BID) | ORAL | Status: DC
  Administered 2022-07-17 – 2022-07-23 (×10): 3.125 mg via ORAL
  Filled 2022-07-17 (×10): qty 1

## 2022-07-17 MED ORDER — GADOBUTROL 1 MMOL/ML IV SOLN
8.0000 mL | Freq: Once | INTRAVENOUS | Status: AC | PRN
  Administered 2022-07-17: 8 mL via INTRAVENOUS

## 2022-07-17 MED ORDER — FUROSEMIDE 10 MG/ML IJ SOLN
20.0000 mg | Freq: Two times a day (BID) | INTRAMUSCULAR | Status: DC
  Administered 2022-07-18: 20 mg via INTRAVENOUS
  Filled 2022-07-17: qty 4

## 2022-07-17 MED ORDER — FUROSEMIDE 10 MG/ML IJ SOLN
40.0000 mg | Freq: Once | INTRAMUSCULAR | Status: AC
  Administered 2022-07-17: 40 mg via INTRAVENOUS
  Filled 2022-07-17: qty 4

## 2022-07-17 MED ORDER — ONDANSETRON HCL 4 MG PO TABS: 4.0000 mg | ORAL_TABLET | Freq: Four times a day (QID) | ORAL | Status: DC | PRN

## 2022-07-17 MED ORDER — ATORVASTATIN CALCIUM 80 MG PO TABS
80.0000 mg | ORAL_TABLET | Freq: Every evening | ORAL | Status: DC
  Administered 2022-07-17 – 2022-07-25 (×9): 80 mg via ORAL
  Filled 2022-07-17 (×9): qty 1

## 2022-07-17 MED ORDER — NITROGLYCERIN 0.4 MG SL SUBL: 0.4000 mg | SUBLINGUAL_TABLET | SUBLINGUAL | Status: DC | PRN

## 2022-07-17 MED ORDER — SENNOSIDES-DOCUSATE SODIUM 8.6-50 MG PO TABS: 1.0000 | ORAL_TABLET | Freq: Every evening | ORAL | Status: DC | PRN

## 2022-07-17 MED ORDER — ONDANSETRON HCL 4 MG/2ML IJ SOLN: 4.0000 mg | Freq: Four times a day (QID) | INTRAMUSCULAR | Status: DC | PRN

## 2022-07-17 MED ORDER — ALLOPURINOL 100 MG PO TABS
100.0000 mg | ORAL_TABLET | Freq: Every day | ORAL | Status: DC
  Administered 2022-07-17 – 2022-07-26 (×10): 100 mg via ORAL
  Filled 2022-07-17 (×11): qty 1

## 2022-07-17 MED ORDER — PANTOPRAZOLE SODIUM 40 MG PO TBEC
40.0000 mg | DELAYED_RELEASE_TABLET | Freq: Every day | ORAL | Status: DC
  Administered 2022-07-17 – 2022-07-26 (×10): 40 mg via ORAL
  Filled 2022-07-17 (×10): qty 1

## 2022-07-17 NOTE — Assessment & Plan Note (Addendum)
Coreg changed to Toprol.  Continue Aldactone.

## 2022-07-17 NOTE — Assessment & Plan Note (Deleted)
-  Strict I's and O's - Complete echo ordered - Status post furosemide 40 mg IV one-time dose per EDP - Ordered furosemide 20 mg IV twice daily, 2 doses for 07/18/2022

## 2022-07-17 NOTE — Assessment & Plan Note (Signed)
-   As needed nicotine patch ordered ?

## 2022-07-17 NOTE — ED Provider Notes (Signed)
[facility] Provider Note    Event Date/Time   First MD Initiated Contact with Patient 07/17/22 1156     (approximate)   History   Leg Swelling and Weakness   HPI  Cameron [name] is a 62 y.o. male paroxysmal A-fib on eliquis, CKD, CHF who comes in with concern for leg swelling.  I reviewed patient's note from 12/28 where patient was given prednisone for gout.  Patient comes in today concerned of bilateral leg swelling patient has had decreased ambulation due to difficulties getting around.  He reports taking gout medicine but is not working.  Patient reports having a gout flare since mid December was been on multiple courses of steroids as well as antibiotics.  He does report that he saw a podiatrist who also thought it was gout but over the past few days he is noticing increasing joint involvement where now it is not only the left ankle but also the left knee and now the right wrist.  The little bit of associated erythema on the wrist.  He reports having gout before but he now has worsening swelling in his bilateral legs as well as some little bit of pain in the right knee as well.  He is got good distal pulses but is having decreased ability to to ambulate.  He denies any falls or hitting his head.  He does report that he takes Lasix but had very little urine output.  He has not been on any steroids since the end of December.  He is currently just finished colchicine with a plan to start allopurinol.     Physical Exam   Triage Vital Signs: ED Triage Vitals  Enc Vitals Group     BP 07/17/22 0931 (!) 141/83     Pulse Rate 07/17/22 0931 (!) 103     Resp 07/17/22 0931 17     Temp 07/17/22 0931 98 F (36.7 C)     Temp Source 07/17/22 0931 Oral     SpO2 07/17/22 0931 97 %     Weight --      Height --      Head Circumference --      Peak Flow --      Pain Score 07/17/22 0932 9     Pain Loc --      Pain Edu? --      Excl. in [place]? --     Most recent vital  signs: Vitals:   07/17/22 0931  BP: (!) 141/83  Pulse: (!) 103  Resp: 17  Temp: 98 F (36.7 C)  SpO2: 97%     General: Awake, no distress.  CV:  Good peripheral perfusion.  Resp:  Normal effort.  Abd:  No distention.  Other:  Swelling noted in bilateral legs 1+ edema.  A little bit of discoloration to the left foot toes but he is reports that is baseline.  Is got distal pulses are palpated bilaterally.  He has some swelling noted to the left ankle of the left knee as well as the right wrist.  He is has decreased range of motion and some redness more on the right wrist.   ED Results / Procedures / Treatments   Labs (all labs ordered are listed, but only abnormal results are displayed) Labs Reviewed  BASIC METABOLIC PANEL - Abnormal; Notable for the following components:      Result Value   Potassium 3.3 (*)    Chloride 96 (*)  Creatinine, Ser 1.30 (*)    All other components within normal limits  CBC - Abnormal; Notable for the following components:   WBC 18.2 (*)    Hemoglobin 12.6 (*)    RDW 18.9 (*)    All other components within normal limits  BRAIN NATRIURETIC PEPTIDE - Abnormal; Notable for the following components:   B Natriuretic Peptide 1,911.6 (*)    All other components within normal limits  URIC ACID  URINALYSIS, ROUTINE W REFLEX MICROSCOPIC  CBG MONITORING, ED     EKG  My interpretation of EKG:  Normal sinus rate of 100 without any ST elevation or T wave inversions, normal intervals  RADIOLOGY I have reviewed the xray personally and interpreted and no effusion noted of the ankle   PROCEDURES:  Critical Care performed: No  Procedures   MEDICATIONS ORDERED IN ED: Medications  acetaminophen (TYLENOL) tablet 650 mg (has no administration in time range)    Or  acetaminophen (TYLENOL) suppository 650 mg (has no administration in time range)  ondansetron (ZOFRAN) tablet 4 mg (has no administration in time range)    Or  ondansetron (ZOFRAN)  injection 4 mg (has no administration in time range)  senna-docusate (Senokot-S) tablet 1 tablet (has no administration in time range)  ketorolac (TORADOL) 15 MG/ML injection 15 mg (has no administration in time range)  nicotine (NICODERM CQ - dosed in mg/24 hours) patch 14 mg (has no administration in time range)  allopurinol (ZYLOPRIM) tablet 100 mg (100 mg Oral Given by Other 07/17/22 1558)  atorvastatin (LIPITOR) tablet 80 mg (has no administration in time range)  carvedilol (COREG) tablet 3.125 mg (has no administration in time range)  nitroGLYCERIN (NITROSTAT) SL tablet 0.4 mg (has no administration in time range)  spironolactone (ALDACTONE) tablet 25 mg (has no administration in time range)  pantoprazole (PROTONIX) EC tablet 40 mg (40 mg Oral Given 07/17/22 1530)  oxyCODONE-acetaminophen (PERCOCET/ROXICET) 5-325 MG per tablet 1 tablet (has no administration in time range)  apixaban (ELIQUIS) tablet 5 mg (has no administration in time range)  furosemide (LASIX) injection 40 mg (40 mg Intravenous Given 07/17/22 1333)     IMPRESSION / MDM / ASSESSMENT AND PLAN / ED COURSE  I reviewed the triage vital signs and the nursing notes.   Patient's presentation is most consistent with acute presentation with potential threat to life or bodily function.   Differential is gout, septic joint, sepsis.  Patient has multiple joint to have redness and erythema.  Seems less likely to be septic joint given the multiple joint involvement but I will add on ESR I will get x-rays to evaluate for a large joint effusion.  I will discuss the case with orthopedics.  He does look fluid overloaded on exam and will be given some IV diuresis.  Chest x-ray concerning for possible pericardial effusion he denies any chest pain or shortness of breath but patient will need to come in for echocardiogram IV diuresis.  His BNP is elevated up from 1 year ago.  His BMP shows creatinine that is around baseline.  CBC shows elevated  white count.  Uric acid levels normal.    2:30 PM discussed with orthopedics who will come by to see patient but given the multiple joint involvement suspect is more likely gout.  They will come by to see patient to see if a steroid injection into the joint will help.  I will discuss the hospital team for admission for concern for CHF and multiple joint gout  The patient is on the cardiac monitor to evaluate for evidence of arrhythmia and/or significant heart rate changes.      FINAL CLINICAL IMPRESSION(S) / ED DIAGNOSES   Final diagnoses:  Acute gout, unspecified cause, unspecified site  Acute on chronic congestive heart failure, unspecified heart failure type (HCC)     Rx / DC Orders   ED Discharge Orders     None        Note:  This document was prepared using Dragon voice recognition software and may include unintentional dictation errors.   Concha Se, MD 07/17/22 [phone]

## 2022-07-17 NOTE — Consult Note (Signed)
ORTHOPAEDIC CONSULTATION  REQUESTING PHYSICIAN: Cox, Amy N, DO  Chief Complaint:   Left knee pain, left ankle pain, right wrist pain  History of Present Illness: Cameron [name] is a 62 y.o. male with a history of A-fib on Eliquis, CKD, CHF who presented with leg swelling and pain.  The patient was recently treated for gout with prednisone and was seen outpatient in the office for left ankle pain and diagnosed with gout by podiatry.  The patient was taking colchicine and supposed to switch to allopurinol and did not start taking the allopurinol as directed.  He presents with increasing polyarthralgia.  Minimal erythema noted over the right wrist with worsening swelling in both legs the left knee and the left ankle.  He denies any fevers or chills at home.  He reports he was having difficulty ambulating due to the pain.  He denies any trauma.  Past Medical History:  Diagnosis Date   CHF (congestive heart failure) ([place])    Coronary artery disease    a. 12/17 PCI with DES to pLAD with resdiual disease (RX therapy)   Hyperlipidemia    Hypertension    PAD (peripheral artery disease) ([place])    STEMI (ST elevation myocardial infarction) ([place]) 2017   Past Surgical History:  Procedure Laterality Date   CARDIAC CATHETERIZATION N/A 06/17/2016   Procedure: Left Heart Cath and Coronary Angiography;  Surgeon: Lorretta Harp, MD;  Location: [facility];  Service: Cardiovascular;  Laterality: N/A;   CARDIAC CATHETERIZATION N/A 06/17/2016   Procedure: Coronary Stent Intervention;  Surgeon: Lorretta Harp, MD;  Location: [facility];  Service: Cardiovascular;  Laterality: N/A;   cardiac stents     CERVICAL SPINE SURGERY     LOWER EXTREMITY ANGIOGRAPHY Left 06/25/2020   Procedure: LOWER EXTREMITY ANGIOGRAPHY;  Surgeon: Algernon Huxley, MD;  Location: [facility];  Service: Cardiovascular;  Laterality: Left;   Social  History   Socioeconomic History   Marital status: Divorced    Spouse name: Not on file   Number of children: Not on file   Years of education: Not on file   Highest education level: Not on file  Occupational History   Not on file  Tobacco Use   Smoking status: Every Day    Packs/day: 1.00    Types: Cigarettes   Smokeless tobacco: Never  Vaping Use   Vaping Use: Never used  Substance and Sexual Activity   Alcohol use: Not Currently    Comment: weekends   Drug use: Yes    Frequency: 1.0 times per week    Types: Marijuana    Comment: twice monthly   Sexual activity: Not on file  Other Topics Concern   Not on file  Social History Narrative   Not on file   Social Determinants of Health   Financial Resource Strain: Not on file  Food Insecurity: Not on file  Transportation Needs: Not on file  Physical Activity: Not on file  Stress: Not on file  Social Connections: Not on file   Family History  Problem Relation Age of Onset   CAD Paternal Grandfather    Breast cancer Mother    Parkinson's disease Father    Alzheimer's disease Father    Hypertension Father    Diabetes Father    No Known Allergies Prior to Admission medications   Medication Sig Start Date End Date Taking? Authorizing Provider  allopurinol (ZYLOPRIM) 100 MG tablet Take 100 mg by mouth daily.  06/23/22 06/23/23 Yes [provider]  atorvastatin (LIPITOR) 80 MG tablet Take 1 tablet (80 mg total) by mouth daily. 02/21/20  Yes Charise Killian, MD  carvedilol (COREG) 3.125 MG tablet Take 1 tablet (3.125 mg total) by mouth 2 (two) times daily with a meal. 06/29/20  Yes Wieting, Richard, MD  colchicine 0.6 MG tablet Take 1 tablet (0.6 mg total) by mouth daily as needed. 07/26/20 07/17/22 Yes Gillis Santa, MD  FARXIGA 10 MG TABS tablet Take 10 mg by mouth daily.   Yes [provider]  oxyCODONE-acetaminophen (PERCOCET/ROXICET) 5-325 MG tablet Take 1 tablet by mouth every 6 (six) hours as needed  for moderate pain. 07/09/22  Yes [provider]  pantoprazole (PROTONIX) 40 MG tablet Take 1 tablet (40 mg total) by mouth daily. 07/07/21 07/17/22 Yes Gilles Chiquito, MD  spironolactone (ALDACTONE) 25 MG tablet Take 1 tablet (25 mg total) by mouth 2 (two) times daily. 04/11/21  Yes Pabon, Diego F, MD  acetaminophen (TYLENOL) 500 MG tablet Take 500 mg by mouth daily.    [provider]  amiodarone (PACERONE) 200 MG tablet Take 1 tablet by mouth 2 (two) times daily. Patient not taking: Reported on 07/17/2022 03/02/20   [provider]  apixaban (ELIQUIS) 5 MG TABS tablet Take 1 tablet by mouth 2 (two) times daily. 01/30/21   [provider]  collagenase (SANTYL) ointment Apply topically daily. 08/20/20   Enedina Finner, MD  doxycycline (VIBRAMYCIN) 100 MG capsule Take 100 mg by mouth 2 (two) times daily. Patient not taking: Reported on 07/17/2022 07/03/22   [provider]  furosemide (LASIX) 20 MG tablet Take 1 tablet (20 mg total) by mouth 2 (two) times daily. 04/11/21 04/11/22  Pabon, Merri Ray, MD  HYDROcodone-acetaminophen (NORCO/VICODIN) 5-325 MG tablet Take 1 tablet by mouth every 6 (six) hours as needed for severe pain. Patient not taking: Reported on 07/17/2022 06/23/22   [provider]  nitroGLYCERIN (NITROSTAT) 0.4 MG SL tablet Place under the tongue. 06/20/16   [provider]  predniSONE (DELTASONE) 5 MG tablet Take 5 mg by mouth daily with breakfast. Patient not taking: Reported on 07/17/2022 07/03/22   [provider]  sucralfate (CARAFATE) 1 g tablet Take 1 tablet (1 g total) by mouth 4 (four) times daily -  with meals and at bedtime for 5 days. 07/07/21 07/12/21  Gilles Chiquito, MD  isosorbide mononitrate (IMDUR) 60 MG 24 hr tablet Take 1 tablet (60 mg total) by mouth daily. 02/04/18 05/30/19  [name], MD   DG Wrist Complete Right  Result Date: 07/17/2022 CLINICAL DATA:  Right wrist pain and swelling. EXAM: RIGHT WRIST -  COMPLETE 3+ VIEW COMPARISON:  None Available. FINDINGS: There is no evidence of acute fracture or dislocation. There is no evidence of arthropathy. Old fifth metacarpal fracture is noted. Soft tissues are unremarkable. IMPRESSION: No acute abnormality seen. Electronically Signed   By: Lupita Raider M.D.   On: 07/17/2022 13:20   DG Knee Complete 4 Views Left  Result Date: 07/17/2022 CLINICAL DATA:  Left knee pain and swelling. EXAM: LEFT KNEE - COMPLETE 4+ VIEW COMPARISON:  None Available. FINDINGS: No evidence of fracture, dislocation, or joint effusion. No evidence of arthropathy or other focal bone abnormality. Multiple vascular stents are noted in the soft tissues. IMPRESSION: No acute abnormality seen in the left knee. Electronically Signed   By: Lupita Raider M.D.   On: 07/17/2022 13:19   DG Ankle Complete Left  Result Date: 07/17/2022 CLINICAL DATA:  Left ankle pain and swelling. EXAM: LEFT ANKLE COMPLETE - 3+ VIEW COMPARISON:  None Available. FINDINGS: There is no evidence of fracture, dislocation, or joint effusion. There is no evidence of arthropathy or other focal bone abnormality. Soft tissues are unremarkable. IMPRESSION: Negative. Electronically Signed   By: Marijo Conception M.D.   On: 07/17/2022 13:17   DG Chest 2 View  Result Date: 07/17/2022 CLINICAL DATA:  Shortness of breath EXAM: CHEST - 2 VIEW COMPARISON:  CXR 07/07/21 FINDINGS: No pleural effusion. No pneumothorax. No focal airspace opacity. No displaced rib fractures. Visualized upper abdomen is unremarkable. Prominent cardiac contours with a possible associated pericardial effusion. IMPRESSION: 1. No focal airspace opacity. 2. Prominent cardiac contours with a possible associated pericardial effusion. Recommend further evaluation with echocardiography. Electronically Signed   By: Marin Roberts M.D.   On: 07/17/2022 12:31    Positive ROS: All other systems have been reviewed and were otherwise negative with the exception of those  mentioned in the HPI and as above.  Physical Exam: General:  Alert, no acute distress Psychiatric:  Patient is competent for consent with normal mood and affect   Cardiovascular:  No pedal edema Respiratory:  No wheezing, non-labored breathing GI:  Abdomen is soft and non-tender Skin:  No lesions in the area of chief complaint Neurologic:  Sensation intact distally Lymphatic:  No axillary or cervical lymphadenopathy  Orthopedic Exam:  Left lower extremity Skin intact with diffuse swelling noted to the leg with 1+ pitting edema.  There are some chronic appearing discoloration to the toes of the foot per patient at baseline. Left knee Some mild tenderness to palpation diffusely around the knee nonfocal No erythema or ecchymosis around the knee There is a moderate effusion palpable to the left knee Knee is stable to exam varus valgus No pain with micromotion of the knee. Pain with large motion of the knee in flexion extension  Left ankle Diffuse swelling around the foot and ankle nonfocal Tenderness to palpation anterior ankle nonspecific No pain with micromotion of the ankle Pain with large motion of the ankle dorsiflexion plantarflexion Able to wiggle toes and sensation intact to light touch over the toes + DP pulse Wrist capillary refill  Right upper extremity Mall amount of erythema over the ulnar aspect of the wrist superficially over the ulnar head no fluctuance to the area tender to palpation at that area No pain with micromotion of the wrist No pain with grip and release AIN/PIN/U/R/M nerve distributions intact + Radial pulse SILT to fingertips   X-rays:  X-rays of the left ankle left knee and right wrist reviewed reports above no evidence of any acute bony injury or fracture.  Minimal effusion noted on any x-ray image.  Assessment: Gout flare  Plan: The patient's presentation with polyarthralgia in the setting of recent gout flare with a period of time off gout  control medications is consistent with the patient's clinical exam at this time.  Given the presence of the left knee effusion, for diagnostic and therapeutic purposes, I discussed a knee arthrocentesis with the patient and the patient elected to move forward with that procedure at this time.  The right wrist and left ankle both also examined like a gout flare as similar to the left knee but without large effusions noted.  I have low clinical suspicion for septic arthritis at this time in any of the joints and the exam and history is more consistent with a flare  of gout.  I will expect a relatively high white count from the left knee arthrocentesis fluid which can be a normal finding in the setting of a gout flare.  At this time further management of his gout will be medical in nature and I recommend follow-up with rheumatology after this hospitalization.  All questions answered and the patient agrees with the above plan.  Procedure Explained the risks and benefits of knee arthrocentesis to the patient including but not limited to infection, damage to nerves and vessels, recurrent swelling, lack of improvement in pain or even worsening of pain, need for additional procedures in the future.  After obtaining verbal consent from the patient to proceed with a left knee arthrocentesis the skin over the lateral knee was prepped and sterile fashion with alcohol and chlorhexidine.  The superficial skin was anesthetized with ethyl chloride prior to placement of an 18-gauge needle in the suprapatellar pouch from a lateral approach.  70 cc of yellow serous fluid with visible clusters of crystals was aspirated from the knee without complication.  A Band-Aid and an Ace wrap were applied to the knee and the patient tolerated the procedure well.  Some of the fluid was sent to the lab for evaluation.    Reinaldo Berber MD  Beeper #:  [phone]  07/17/2022 3:19 PM

## 2022-07-17 NOTE — ED Triage Notes (Addendum)
Pt presents to ED with c/o of bilateral leg swelling. Pt also endorses possible gout flare up. Pt states he hasn't bee able to get around due to swelling and pain. Pt states not taking gout medicine due it  "not working"   Pt seen recently for same.

## 2022-07-17 NOTE — Assessment & Plan Note (Addendum)
Coreg changed over to Toprol.

## 2022-07-17 NOTE — Hospital Course (Addendum)
62 year old male with coronary artery disease, atrial fibrillation, hyperlipidemia, CAD, GERD, who presents emergency department for chief concerns of bilateral lower extremity swelling and multiple joint pain. Admitted with concerns of CHF and possible septic arthritis. Outpatient PCP hide patient has gout therefore received a course of colchicine and thereafter prescribed allopurinol which he has not started. Arthrocentesis was performed by orthopedic which showed elevated WBC and was suspected to be secondary to acute gout. No organism were seen on micro. In the meantime getting IV diuretics for CHF exacerbation   1/17.  With patient having poor range of motion of his left ankle and left knee more than the right ankle will give 2 doses of colchicine and give a dose of Solu-Medrol this afternoon.  Colchicine interacts with Coreg I will switch the patient's Coreg over to Toprol-XL. 1/18.  Better range of motion left knee and left ankle.  Still trying to get a little bit better range of motion.  Patient interested in going home with home health. 1/19.  Patient complains of bleeding from the rectum last night and blood in the urine. 1/20.  With holding Eliquis urine has improved a bit.  Patient will hold Eliquis another day and then can go back on Eliquis as outpatient.  If hematuria returns then can set up appointment with urology.  Hemoglobin stable.  Would recommend setting up outpatient colonoscopy.

## 2022-07-17 NOTE — ED Notes (Signed)
Delayed in administering the atorvastatin as it is out in all Pyxis'. Messaged the Main Pharmacy to send to [facility]

## 2022-07-17 NOTE — Assessment & Plan Note (Signed)
-   Atorvastatin 80 mg daily resumed 

## 2022-07-17 NOTE — Assessment & Plan Note (Addendum)
Likely secondary to gout flare and steroids.

## 2022-07-17 NOTE — H&P (Signed)
History and Physical   Cameron [name] XLK:[id number] DOB: 09-Mar-1961 DOA: 07/17/2022  PCP: [facility]  Outpatient Specialists: Dr. Alberteen Spindle, podiatry Patient coming from: home  I have personally briefly reviewed patient's old medical records in [facility] EMR.  Chief Concern: bilateral leg swelling  HPI: Cameron [name] is a 62 year old male with coronary artery disease, atrial fibrillation, hyperlipidemia, CAD, GERD, who presents emergency department for chief concerns of bilateral lower extremity swelling and multiple joint pain.  Initial vitals in the ED showed temperature of 98, respiration rate of 17, heart rate of 103, blood pressure 141/83, SpO2 of 97% on room air.  Serum sodium is 136, potassium 3.3, chloride 96, bicarb 25, BUN of 13, serum creatinine 1.30, EGFR greater than 60, nonfasting blood glucose 97, WBC 18.2, hemoglobin 12.6, platelets of 270.  BNP was elevated at 1911.6.  Lactic acid was 1.6.  UA was negative for leukocytes and nitrates.  Right wrist, left knee, left ankle x-rays were ordered: Was read as no acute abnormality.  ED treatment: 40 mg IV one-time dose. ---------------------- At bedside patient was able to tell me his name, his age, he knows he is in the hospital and he noticed the current calendar year is 2024.  He reports that he has been having worsening bilateral lower extremity swelling since 06/23/2022.  He endorses persistent multi joint gout like pain with redness and warmth. He states the pain is similar to prior gout flare episodes.  He was advised his PCP, to start allopurinol after completion of prescribed colchicine. He reported that he completed the treatment of colchicine on 07/03/22, however he has not started the allopurinol.  He was waiting for the colchcine to 'get out of my system'. He counseled patient that it has been 14 days and he has not followed the medical recommendations of his provider regarding gout  management. Patient reports that he does not know why he didn't start the allopurinol sooner.  Social history: He lives at home. He endorses daily tobacco use, 1 ppd. He denies etoh use, quitting about 3 weeks ago. He denies recreational drug use, especially IV drug use.  ROS: Constitutional: no weight change, no fever ENT/Mouth: no sore throat, no rhinorrhea Eyes: no eye pain, no vision changes Cardiovascular: no chest pain, no dyspnea,  + edema, no palpitations Respiratory: no cough, no sputum, no wheezing Gastrointestinal: no nausea, no vomiting, no diarrhea, no constipation Genitourinary: no urinary incontinence, no dysuria, no hematuria Musculoskeletal: + arthralgias, no myalgias Skin: no skin lesions, no pruritus, Neuro: + weakness, no loss of consciousness, no syncope Psych: no anxiety, no depression, no decrease appetite Heme/Lymph: no bruising, no bleeding  ED Course: Discussed with emergency medicine provider, patient requiring hospitalization for chief concerns of heart failure exacerbation.  Assessment/Plan  Principal Problem:   Acute exacerbation of CHF (congestive heart failure) (HCC) Active Problems:   Essential hypertension   Hyperlipidemia   CAD (coronary artery disease), native coronary artery   Tobacco abuse   Acute gouty arthritis   Leukocytosis   Assessment and Plan:  * Acute exacerbation of CHF (congestive heart failure) (HCC) - Strict I's and O's - Complete echo ordered - Status post furosemide 40 mg IV one-time dose per EDP - Ordered furosemide 20 mg IV twice daily, 2 doses for 07/18/2022  Leukocytosis - Etiology workup in progress at this time - Differentials include demargination secondary to steroid use on 06/23/2022 versus septic arthritis - MRI of the left ankle and left knee with  and without contrast have been ordered - If MRI is positive for septic arthritis, will consult IR for aspiration and culture and/or orthopedic service  Tobacco  abuse - As needed nicotine patch ordered  CAD (coronary artery disease), native coronary artery - Carvedilol 3.125 mg p.o. twice daily with meals resumed, spironolactone 25 mg p.o. twice daily resumed  Hyperlipidemia - Atorvastatin 80 mg daily resumed  Essential hypertension - Carvedilol 3.125 mg p.o. twice daily, spironolactone 25 mg p.o. twice daily resumed  Chart reviewed.   DVT prophylaxis: Eliquis 5 mg p.o. twice daily Code Status: Full code Diet: Heart healthy Family Communication: No Disposition Plan: Pending clinical course patient Consults called: None at this time Admission status: Telemetry cardiac, inpatient  Past Medical History:  Diagnosis Date   CHF (congestive heart failure) (HCC)    Coronary artery disease    a. 12/17 PCI with DES to pLAD with resdiual disease (RX therapy)   Hyperlipidemia    Hypertension    PAD (peripheral artery disease) (HCC)    STEMI (ST elevation myocardial infarction) (HCC) 2017   Past Surgical History:  Procedure Laterality Date   CARDIAC CATHETERIZATION N/A 06/17/2016   Procedure: Left Heart Cath and Coronary Angiography;  Surgeon: Runell Gess, MD;  Location: [facility] INVASIVE CV LAB;  Service: Cardiovascular;  Laterality: N/A;   CARDIAC CATHETERIZATION N/A 06/17/2016   Procedure: Coronary Stent Intervention;  Surgeon: Runell Gess, MD;  Location: [facility] INVASIVE CV LAB;  Service: Cardiovascular;  Laterality: N/A;   cardiac stents     CERVICAL SPINE SURGERY     LOWER EXTREMITY ANGIOGRAPHY Left 06/25/2020   Procedure: LOWER EXTREMITY ANGIOGRAPHY;  Surgeon: Annice Needy, MD;  Location: [facility] INVASIVE CV LAB;  Service: Cardiovascular;  Laterality: Left;   Social History:  reports that he has been smoking cigarettes. He has been smoking an average of 1 pack per day. He has never used smokeless tobacco. He reports that he does not currently use alcohol. He reports current drug use. Frequency: 1.00 time per week. Drug: Marijuana.  No  Known Allergies Family History  Problem Relation Age of Onset   CAD Paternal Grandfather    Breast cancer Mother    Parkinson's disease Father    Alzheimer's disease Father    Hypertension Father    Diabetes Father    Family history: Family history reviewed and not pertinent  Prior to Admission medications   Medication Sig Start Date End Date Taking? Authorizing Provider  allopurinol (ZYLOPRIM) 100 MG tablet Take 100 mg by mouth daily. 06/23/22 06/23/23 Yes [provider]  atorvastatin (LIPITOR) 80 MG tablet Take 1 tablet (80 mg total) by mouth daily. 02/21/20  Yes Charise Killian, MD  carvedilol (COREG) 3.125 MG tablet Take 1 tablet (3.125 mg total) by mouth 2 (two) times daily with a meal. 06/29/20  Yes Wieting, Richard, MD  colchicine 0.6 MG tablet Take 1 tablet (0.6 mg total) by mouth daily as needed. 07/26/20 07/17/22 Yes Gillis Santa, MD  FARXIGA 10 MG TABS tablet Take 10 mg by mouth daily.   Yes [provider]  oxyCODONE-acetaminophen (PERCOCET/ROXICET) 5-325 MG tablet Take 1 tablet by mouth every 6 (six) hours as needed for moderate pain. 07/09/22  Yes [provider]  pantoprazole (PROTONIX) 40 MG tablet Take 1 tablet (40 mg total) by mouth daily. 07/07/21 07/17/22 Yes Gilles Chiquito, MD  spironolactone (ALDACTONE) 25 MG tablet Take 1 tablet (25 mg total) by mouth 2 (two) times daily. 04/11/21  Yes Pabon,  Diego F, MD  acetaminophen (TYLENOL) 500 MG tablet Take 500 mg by mouth daily.    [provider]  amiodarone (PACERONE) 200 MG tablet Take 1 tablet by mouth 2 (two) times daily. Patient not taking: Reported on 07/17/2022 03/02/20   [provider]  apixaban (ELIQUIS) 5 MG TABS tablet Take 1 tablet by mouth 2 (two) times daily. 01/30/21   [provider]  collagenase (SANTYL) ointment Apply topically daily. 08/20/20   Fritzi Mandes, MD  doxycycline (VIBRAMYCIN) 100 MG capsule Take 100 mg by mouth 2 (two) times daily. Patient not  taking: Reported on 07/17/2022 07/03/22   [provider]  furosemide (LASIX) 20 MG tablet Take 1 tablet (20 mg total) by mouth 2 (two) times daily. 04/11/21 04/11/22  Pabon, Marjory Lies, MD  HYDROcodone-acetaminophen (NORCO/VICODIN) 5-325 MG tablet Take 1 tablet by mouth every 6 (six) hours as needed for severe pain. Patient not taking: Reported on 07/17/2022 06/23/22   [provider]  nitroGLYCERIN (NITROSTAT) 0.4 MG SL tablet Place under the tongue. 06/20/16   [provider]  predniSONE (DELTASONE) 5 MG tablet Take 5 mg by mouth daily with breakfast. Patient not taking: Reported on 07/17/2022 07/03/22   [provider]  sucralfate (CARAFATE) 1 g tablet Take 1 tablet (1 g total) by mouth 4 (four) times daily -  with meals and at bedtime for 5 days. 07/07/21 07/12/21  Lucrezia Starch, MD  isosorbide mononitrate (IMDUR) 60 MG 24 hr tablet Take 1 tablet (60 mg total) by mouth daily. 02/04/18 05/30/19  Demetrios Loll, MD   Physical Exam: Vitals:   07/17/22 0931 07/17/22 1236 07/17/22 1300 07/17/22 1355  BP: (!) 141/83 132/80 127/75 127/75  Pulse: (!) 103 95 98 98  Resp: 17 16  16   Temp: 98 F (36.7 C) 98 F (36.7 C)  98 F (36.7 C)  TempSrc: Oral Oral  Oral  SpO2: 97% 97% 94% 94%   Constitutional: appears age-appropriate, NAD, calm, comfortable Eyes: PERRL, lids and conjunctivae normal ENMT: Mucous membranes are moist. Posterior pharynx clear of any exudate or lesions. Age-appropriate dentition. Hearing appropriate Neck: normal, supple, no masses, no thyromegaly Respiratory: clear to auscultation bilaterally, no wheezing, no crackles. Normal respiratory effort. No accessory muscle use.  Cardiovascular: Regular rate and rhythm, no murmurs / rubs / gallops.  Bilateral lower extremity edema. 2+ pedal pulses. No carotid bruits.  Abdomen: obese abdomen, no tenderness, no masses palpated, no hepatosplenomegaly. Bowel sounds positive.  Musculoskeletal: no clubbing / cyanosis.  No joint deformity upper and lower extremities. Good ROM, no contractures, no atrophy. Normal muscle tone.  Left ankle and left knee are warm with increased redness.  Right wrist skin is warm with increased redness. Skin: no rashes, lesions, ulcers. No induration Neurologic: Sensation intact. Strength 5/5 in all 4.  Psychiatric: Normal judgment and insight. Alert and oriented x 3. Normal mood.   EKG: independently reviewed, showing sinus rhythm with rate of 100, QTc 472  Chest x-ray on Admission: I personally reviewed and I agree with radiologist reading as below.  DG Wrist Complete Right  Result Date: 07/17/2022 CLINICAL DATA:  Right wrist pain and swelling. EXAM: RIGHT WRIST - COMPLETE 3+ VIEW COMPARISON:  None Available. FINDINGS: There is no evidence of acute fracture or dislocation. There is no evidence of arthropathy. Old fifth metacarpal fracture is noted. Soft tissues are unremarkable. IMPRESSION: No acute abnormality seen. Electronically Signed   By: Marijo Conception M.D.   On: 07/17/2022 13:20  DG Knee Complete 4 Views Left  Result Date: 07/17/2022 CLINICAL DATA:  Left knee pain and swelling. EXAM: LEFT KNEE - COMPLETE 4+ VIEW COMPARISON:  None Available. FINDINGS: No evidence of fracture, dislocation, or joint effusion. No evidence of arthropathy or other focal bone abnormality. Multiple vascular stents are noted in the soft tissues. IMPRESSION: No acute abnormality seen in the left knee. Electronically Signed   By: Marijo Conception M.D.   On: 07/17/2022 13:19   DG Ankle Complete Left  Result Date: 07/17/2022 CLINICAL DATA:  Left ankle pain and swelling. EXAM: LEFT ANKLE COMPLETE - 3+ VIEW COMPARISON:  None Available. FINDINGS: There is no evidence of fracture, dislocation, or joint effusion. There is no evidence of arthropathy or other focal bone abnormality. Soft tissues are unremarkable. IMPRESSION: Negative. Electronically Signed   By: Marijo Conception M.D.   On: 07/17/2022 13:17    DG Chest 2 View  Result Date: 07/17/2022 CLINICAL DATA:  Shortness of breath EXAM: CHEST - 2 VIEW COMPARISON:  CXR 07/07/21 FINDINGS: No pleural effusion. No pneumothorax. No focal airspace opacity. No displaced rib fractures. Visualized upper abdomen is unremarkable. Prominent cardiac contours with a possible associated pericardial effusion. IMPRESSION: 1. No focal airspace opacity. 2. Prominent cardiac contours with a possible associated pericardial effusion. Recommend further evaluation with echocardiography. Electronically Signed   By: Marin Roberts M.D.   On: 07/17/2022 12:31    Labs on Admission: I have personally reviewed following labs  CBC: Recent Labs  Lab 07/17/22 0936  WBC 18.2*  HGB 12.6*  HCT 41.1  MCV 94.7  PLT 144   Basic Metabolic Panel: Recent Labs  Lab 07/17/22 0936  NA 136  K 3.3*  CL 96*  CO2 25  GLUCOSE 97  BUN 13  CREATININE 1.30*  CALCIUM 9.0   GFR: CrCl cannot be calculated (Unknown ideal weight.).  Liver Function Tests: Recent Labs  Lab 07/17/22 1202  AST 19  ALT 8  ALKPHOS 105  BILITOT 1.8*  PROT 8.0  ALBUMIN 3.3*   CBG: Recent Labs  Lab 07/17/22 1232  GLUCAP 102*   Urine analysis:    Component Value Date/Time   COLORURINE AMBER (A) 07/17/2022 1233   APPEARANCEUR HAZY (A) 07/17/2022 1233   APPEARANCEUR Hazy 11/20/2011 1343   LABSPEC 1.020 07/17/2022 1233   LABSPEC 1.026 11/20/2011 1343   PHURINE 5.0 07/17/2022 1233   GLUCOSEU 150 (A) 07/17/2022 1233   GLUCOSEU Negative 11/20/2011 1343   HGBUR LARGE (A) 07/17/2022 1233   BILIRUBINUR NEGATIVE 07/17/2022 1233   BILIRUBINUR Negative 11/20/2011 1343   KETONESUR 5 (A) 07/17/2022 1233   PROTEINUR 100 (A) 07/17/2022 1233   NITRITE NEGATIVE 07/17/2022 1233   LEUKOCYTESUR NEGATIVE 07/17/2022 1233   LEUKOCYTESUR Negative 11/20/2011 1343   This document was prepared using Dragon Voice Recognition software and may include unintentional dictation errors.  Dr. Tobie Poet [facility]  If 7PM-7AM, please contact overnight-coverage provider If 7AM-7PM, please contact day coverage provider [email or web address]  07/17/2022, 3:41 PM

## 2022-07-18 ENCOUNTER — Encounter: Payer: Self-pay | Admitting: Internal Medicine

## 2022-07-18 ENCOUNTER — Inpatient Hospital Stay
Admission: EM | Admit: 2022-07-18 | Discharge: 2022-07-18 | Disposition: A | Discharge status: Home / Self Care | Payer: BLUE CROSS/BLUE SHIELD | Source: Home / Self Care | Attending: Internal Medicine | Admitting: Internal Medicine

## 2022-07-18 DIAGNOSIS — I509 Heart failure, unspecified: Secondary | ICD-10-CM

## 2022-07-18 LAB — GLUCOSE, BODY FLUID OTHER: Glucose, Body Fluid Other: 20 mg/dL

## 2022-07-18 LAB — BASIC METABOLIC PANEL
Anion gap: 10 (ref 5–15)
BUN: 15 mg/dL (ref 6–20)
CO2: 29 mmol/L (ref 22–32)
Calcium: 8.2 mg/dL — ABNORMAL LOW (ref 8.9–10.3)
Chloride: 98 mmol/L (ref 98–111)
Creatinine, Ser: 1.24 mg/dL (ref 0.61–1.24)
GFR, Estimated: >60 mL/min (ref >60)
Glucose, Bld: 100 mg/dL — ABNORMAL HIGH (ref 70–99)
Potassium: 3 mmol/L — ABNORMAL LOW (ref 3.5–5.1)
Sodium: 137 mmol/L (ref 135–145)

## 2022-07-18 LAB — CBC
HCT: 34.9 % — ABNORMAL LOW (ref 39.0–52.0)
Hemoglobin: 10.8 g/dL — ABNORMAL LOW (ref 13.0–17.0)
MCH: 29 pg (ref 26.0–34.0)
MCHC: 30.9 g/dL (ref 30.0–36.0)
MCV: 93.8 fL (ref 80.0–100.0)
Platelets: 223 10*3/uL (ref 150–400)
RBC: 3.72 MIL/uL — ABNORMAL LOW (ref 4.22–5.81)
RDW: 18.7 % — ABNORMAL HIGH (ref 11.5–15.5)
WBC: 13.7 10*3/uL — ABNORMAL HIGH (ref 4.0–10.5)
nRBC: 0 % (ref 0.0–0.2)

## 2022-07-18 LAB — PROTEIN, BODY FLUID (OTHER): Total Protein, Body Fluid Other: 4.9 g/dL

## 2022-07-18 MED ORDER — HYDRALAZINE HCL 20 MG/ML IJ SOLN: 10.0000 mg | INTRAMUSCULAR | Status: DC | PRN

## 2022-07-18 MED ORDER — METOPROLOL TARTRATE 5 MG/5ML IV SOLN: 5.0000 mg | INTRAVENOUS | Status: DC | PRN

## 2022-07-18 MED ORDER — FUROSEMIDE 10 MG/ML IJ SOLN
20.0000 mg | Freq: Two times a day (BID) | INTRAMUSCULAR | Status: AC
  Administered 2022-07-19 – 2022-07-20 (×3): 20 mg via INTRAVENOUS
  Filled 2022-07-18 (×3): qty 2

## 2022-07-18 MED ORDER — IPRATROPIUM-ALBUTEROL 0.5-2.5 (3) MG/3ML IN SOLN
3.0000 mL | Freq: Two times a day (BID) | RESPIRATORY_TRACT | Status: DC
  Administered 2022-07-18 – 2022-07-19 (×4): 3 mL via RESPIRATORY_TRACT
  Filled 2022-07-18 (×4): qty 3

## 2022-07-18 MED ORDER — POTASSIUM CHLORIDE CRYS ER 20 MEQ PO TBCR
40.0000 meq | EXTENDED_RELEASE_TABLET | ORAL | Status: AC
  Administered 2022-07-18 (×3): 40 meq via ORAL
  Filled 2022-07-18 (×3): qty 2

## 2022-07-18 MED ORDER — IPRATROPIUM-ALBUTEROL 0.5-2.5 (3) MG/3ML IN SOLN: 3.0000 mL | RESPIRATORY_TRACT | Status: DC | PRN

## 2022-07-18 MED ORDER — GUAIFENESIN 100 MG/5ML PO LIQD: 5.0000 mL | ORAL | Status: DC | PRN

## 2022-07-18 NOTE — Evaluation (Signed)
Occupational Therapy Evaluation Patient Details Name: Cameron [name] MRN: [id number] DOB: 1960-07-20 Today's Date: 07/18/2022   History of Present Illness Cameron [name] is a 62 year old male with coronary artery disease, atrial fibrillation, hyperlipidemia, CAD, GERD, who presents emergency department for chief concerns of bilateral lower extremity swelling and multiple joint pain.   Clinical Impression   Patient presenting with decreased Ind in self care,balance, functional mobility/transfers, endurance, and safety awareness. Patient reports living at home alone with use of RW for mobility but endorses self care and mobility have been increasing in difficulty in the last month. He does not endorse having any assistance at discharge. Pt donning R socks with assistance to bring LE into figure four position and assist to don L sock. Pt stands with +2 assistance and only able to take small side steps to the L along EOB. Pt unable to manage safely at home at this time and SNF recommended after hospital discharge to address functional deficits.  Patient will benefit from acute OT to increase overall independence in the areas of ADLs, functional mobility, and safety awareness in order to safely discharge to next venue of care.       Recommendations for follow up therapy are one component of a multi-disciplinary discharge planning process, led by the attending physician.  Recommendations may be updated based on patient status, additional functional criteria and insurance authorization.   Follow Up Recommendations  Skilled nursing-short term rehab (<3 hours/day)     Assistance Recommended at Discharge Intermittent Supervision/Assistance  Patient can return home with the following A lot of help with walking and/or transfers;A lot of help with bathing/dressing/bathroom;Assistance with cooking/housework;Help with stairs or ramp for entrance;Assist for transportation    Functional Status  Assessment  Patient has had a recent decline in their functional status and demonstrates the ability to make significant improvements in function in a reasonable and predictable amount of time.  Equipment Recommendations  BSC/3in1       Precautions / Restrictions Precautions Precautions: Fall      Mobility Bed Mobility Overal bed mobility: Needs Assistance Bed Mobility: Sit to Supine, Supine to Sit     Supine to sit: Mod assist Sit to supine: Mod assist        Transfers Overall transfer level: Needs assistance Equipment used: 2 person hand held assist, Rolling walker (2 wheels) Transfers: Sit to/from Stand Sit to Stand: +2 physical assistance, Min assist           General transfer comment: side steps with min guard of 2 along EOB with increased effort      Balance Overall balance assessment: Needs assistance Sitting-balance support: Feet supported Sitting balance-Leahy Scale: Good     Standing balance support: Reliant on assistive device for balance, During functional activity, Bilateral upper extremity supported Standing balance-Leahy Scale: Poor                             ADL either performed or assessed with clinical judgement   ADL Overall ADL's : Needs assistance/impaired                     Lower Body Dressing: Minimal assistance;Moderate assistance;Sit to/from stand Lower Body Dressing Details (indicate cue type and reason): to don B socks while seated on EOB                     Vision Patient Visual Report: No change from baseline  Pertinent Vitals/Pain Pain Assessment Pain Assessment: No/denies pain     Hand Dominance Right   Extremity/Trunk Assessment Upper Extremity Assessment Upper Extremity Assessment: RUE deficits/detail;Generalized weakness RUE Deficits / Details: increased pain and pt reports gout in R hand making transfers more difficult   Lower Extremity Assessment Lower Extremity  Assessment: Generalized weakness       Communication Communication Communication: No difficulties   Cognition Arousal/Alertness: Awake/alert Behavior During Therapy: WFL for tasks assessed/performed Overall Cognitive Status: Within Functional Limits for tasks assessed                                                  Home Living Family/patient expects to be discharged to:: Private residence Living Arrangements: Alone   Type of Home: House Home Access: Stairs to enter [employer] of Steps: 5 Entrance Stairs-Rails: Right;Left;Can reach both Home Layout: One level     Bathroom Shower/Tub: Occupational psychologist: Standard     Home Equipment: Conservation officer, nature (2 wheels);Rollator (4 wheels);Crutches          Prior Functioning/Environment Prior Level of Function : Independent/Modified Independent;Driving               ADLs Comments: Pt reports increased difficulty and needing increased time to complete self care tasks and ambulation with AD        OT Problem List: Decreased strength;Pain;Decreased range of motion;Decreased activity tolerance;Decreased safety awareness;Impaired balance (sitting and/or standing);Decreased knowledge of use of DME or AE      OT Treatment/Interventions: Self-care/ADL training;Therapeutic exercise;Therapeutic activities;Energy conservation;Patient/family education;DME and/or AE instruction;Balance training    OT Goals(Current goals can be found in the care plan section) Acute Rehab OT Goals Patient Stated Goal: to go home OT Goal Formulation: With patient Time For Goal Achievement: 08/01/22 Potential to Achieve Goals: Fair ADL Goals Pt Will Perform Grooming: with supervision;standing Pt Will Perform Lower Body Dressing: with supervision;sit to/from stand Pt Will Transfer to Toilet: with supervision;ambulating Pt Will Perform Toileting - Clothing Manipulation and hygiene: with supervision;sit  to/from stand  OT Frequency: Min 2X/week    Co-evaluation PT/OT/SLP Co-Evaluation/Treatment: Yes Reason for Co-Treatment: For patient/therapist safety;To address functional/ADL transfers PT goals addressed during session: Mobility/safety with mobility;Balance OT goals addressed during session: ADL's and self-care      AM-PAC OT "6 Clicks" Daily Activity     Outcome Measure Help from another person eating meals?: None Help from another person taking care of personal grooming?: A Little Help from another person toileting, which includes using toliet, bedpan, or urinal?: A Lot Help from another person bathing (including washing, rinsing, drying)?: A Lot Help from another person to put on and taking off regular upper body clothing?: A Little Help from another person to put on and taking off regular lower body clothing?: A Lot 6 Click Score: 16   End of Session Equipment Utilized During Treatment: Rolling walker (2 wheels) Nurse Communication: Mobility status  Activity Tolerance: Patient tolerated treatment well Patient left: in bed;with call bell/phone within reach;with bed alarm set  OT Visit Diagnosis: Unsteadiness on feet (R26.81);Muscle weakness (generalized) (M62.81);Pain Pain - Right/Left: Right Pain - part of body: Ankle and joints of foot;Knee                Time: 1345-1414 OT Time Calculation (min): 29 min Charges:  OT General Charges $OT Visit: 1  Visit OT Evaluation $OT Eval Moderate Complexity: 1 Mod OT Treatments $Self Care/Home Management : 8-22 mins  Jackquline Denmark, MS, OTR/L , CBIS ascom [phone]  07/18/22, 3:01 PM

## 2022-07-18 NOTE — ED Notes (Signed)
RN doing room air trial on patient. Pt was placed on 2LNC last night due to o2 saturation dropping while sleeping. Pt o2 saturation currently 99% on room air.

## 2022-07-18 NOTE — Discharge Instructions (Addendum)

## 2022-07-18 NOTE — ED Notes (Signed)
Pt o2 saturation dropping to 88% on RA while pt sleeping. Pt placed back on 2LNC.

## 2022-07-18 NOTE — ED Notes (Signed)
Breathing treatment complete. Pt to be taken to room via stretcher. Pt stable at time of departure.

## 2022-07-18 NOTE — ED Notes (Signed)
Pt to be moved after breathing treatment finished

## 2022-07-18 NOTE — Progress Notes (Addendum)
PROGRESS NOTE    Cameron [name]  YHC:[id number] DOB: [birth year] DOA: 07/17/2022 PCP: [facility]   Brief Narrative:  62 year old male with coronary artery disease, atrial fibrillation, hyperlipidemia, CAD, GERD, who presents emergency department for chief concerns of bilateral lower extremity swelling and multiple joint pain.  Admitted with concerns of CHF and possible septic arthritis.  Outpatient PCP hide patient has gout therefore received a course of colchicine and thereafter prescribed allopurinol which he has not started.   Assessment & Plan:  Principal Problem:   Acute exacerbation of CHF (congestive heart failure) (HCC) Active Problems:   Essential hypertension   Hyperlipidemia   CAD (coronary artery disease), native coronary artery   Tobacco abuse   Acute gouty arthritis   Leukocytosis     Assessment and Plan: * Acute exacerbation of CHF (congestive heart failure) (HCC), with reduced ejection fraction, 25% Elevated BNP -Patient with elevated BNP, getting IV diuretics.  Echocardiogram ordered.  Monitor urine output.  Chest x-ray showing possible small pericardial effusion.  Leukocytosis - Etiology is unclear at this time, this could be secondary to steroid use  Multiple joint complaints -X-ray of his left ankle right wrist and left knee is unremarkable.   But MRI of the left ankle shows concerns about contusion/injury, tendinosis with peritendinitis, osteochondral injury of the talar dome, ankle sprain and generalized swelling.  There is no obvious evidence of fracture, dislocation, arthritis or osteomyelitis. MRI of the knee shows mild tear of medial meniscus, ligament sprain, osteoarthritis, small joint effusion.  No obvious evidence of osteomyelitis or fracture Seen by orthopedic who suspecting gout flare. Left knee aspiration shows elevated WBC but no organisms seen on Gram stain, follow cultures.  Hypokalemia - Repletion  Paroxysmal atrial  fibrillation - Coreg, Eliquis  Tobacco abuse - As needed nicotine patch ordered  CAD (coronary artery disease), native coronary artery Ischemic cardiomyopathy - Coreg, Aldactone, statin  Hyperlipidemia - Atorvastatin 80 mg daily resumed  Essential hypertension - Carvedilol 3.125 mg p.o. twice daily, spironolactone 25 mg p.o. twice daily resumed IV as needed    DVT prophylaxis: Eliquis Code Status: Full Family Communication: Called son no answer  Status is: Inpatient Ongoing evaluation for CHF, getting echocardiogram.  IV diuretics.  Subjective: Tells me his left knee feels much better after performing arthrocentesis.  70 cc of yellow fluid was removed. At this time does not have any complaints, denies any shortness of breath at rest but is afraid that he does get exertional shortness of breath.   Examination:  General exam: Appears calm and comfortable  Respiratory system: Bibasilar crackles Cardiovascular system: S1 & S2 heard, RRR. No JVD, murmurs, rubs, gallops or clicks. No pedal edema. Gastrointestinal system: Abdomen is nondistended, soft and nontender. No organomegaly or masses felt. Normal bowel sounds heard. Central nervous system: Alert and oriented. No focal neurological deficits. Extremities: Symmetric 4 x 5 power. Skin: No rashes, lesions or ulcers Psychiatry: Judgement and insight appear normal. Mood & affect appropriate.     Objective: Vitals:   07/18/22 0100 07/18/22 0400 07/18/22 0700 07/18/22 0800  BP: 94/77 (!) 106/59 97/66 111/73  Pulse: 76 69 77 77  Resp: 20 20  18   Temp: 98.1 F (36.7 C) 98 F (36.7 C)  98.1 F (36.7 C)  TempSrc: Oral     SpO2: 92% 95% 99% 100%  Weight:      Height:        Intake/Output Summary (Last 24 hours) at 07/18/2022 0830 Last data filed at  07/18/2022 0400 Gross per 24 hour  Intake --  Output 250 ml  Net -250 ml   Filed Weights   07/17/22 1602  Weight: 83.9 kg     Data Reviewed:   CBC: Recent Labs   Lab 07/17/22 0936 07/18/22 0449  WBC 18.2* 13.7*  HGB 12.6* 10.8*  HCT 41.1 34.9*  MCV 94.7 93.8  PLT 270 223   Basic Metabolic Panel: Recent Labs  Lab 07/17/22 0936 07/17/22 1525 07/18/22 0449  NA 136  --  137  K 3.3*  --  3.0*  CL 96*  --  98  CO2 25  --  29  GLUCOSE 97  --  100*  BUN 13  --  15  CREATININE 1.30*  --  1.24  CALCIUM 9.0  --  8.2*  MG  --  2.2  --    GFR: Estimated Creatinine Clearance: 65.6 mL/min (by C-G formula based on SCr of 1.24 mg/dL). Liver Function Tests: Recent Labs  Lab 07/17/22 1202  AST 19  ALT 8  ALKPHOS 105  BILITOT 1.8*  PROT 8.0  ALBUMIN 3.3*   No results for input(s): "LIPASE", "AMYLASE" in the last 168 hours. No results for input(s): "AMMONIA" in the last 168 hours. Coagulation Profile: No results for input(s): "INR", "PROTIME" in the last 168 hours. Cardiac Enzymes: No results for input(s): "CKTOTAL", "CKMB", "CKMBINDEX", "TROPONINI" in the last 168 hours. BNP (last 3 results) No results for input(s): "PROBNP" in the last 8760 hours. HbA1C: No results for input(s): "HGBA1C" in the last 72 hours. CBG: Recent Labs  Lab 07/17/22 1232  GLUCAP 102*   Lipid Profile: No results for input(s): "CHOL", "HDL", "LDLCALC", "TRIG", "CHOLHDL", "LDLDIRECT" in the last 72 hours. Thyroid Function Tests: No results for input(s): "TSH", "T4TOTAL", "FREET4", "T3FREE", "THYROIDAB" in the last 72 hours. Anemia Panel: No results for input(s): "VITAMINB12", "FOLATE", "FERRITIN", "TIBC", "IRON", "RETICCTPCT" in the last 72 hours. Sepsis Labs: Recent Labs  Lab 07/17/22 1327 07/17/22 1525  LATICACIDVEN 1.6 2.2*    Recent Results (from the past 240 hour(s))  Blood culture (routine x 2)     Status: None (Preliminary result)   Collection Time: 07/17/22  1:27 PM   Specimen: BLOOD  Result Value Ref Range Status   Specimen Description BLOOD  RIGHT ARM  Final   Special Requests   Final    BOTTLES DRAWN AEROBIC AND ANAEROBIC Blood Culture  results may not be optimal due to an excessive volume of blood received in culture bottles   Culture   Final    NO GROWTH < 24 HOURS Performed at [facility], [address], [place], [place] [address]    Report Status PENDING  Incomplete  Blood culture (routine x 2)     Status: None (Preliminary result)   Collection Time: 07/17/22  1:27 PM   Specimen: BLOOD  Result Value Ref Range Status   Specimen Description BLOOD  RIGHT HAND  Final   Special Requests   Final    BOTTLES DRAWN AEROBIC AND ANAEROBIC Blood Culture results may not be optimal due to an inadequate volume of blood received in culture bottles   Culture   Final    NO GROWTH < 24 HOURS Performed at [facility] [address] [place], [place] [address]    Report Status PENDING  Incomplete  Body fluid culture w Gram Stain     Status: None (Preliminary result)   Collection Time: 07/17/22  3:25 PM   Specimen: Synovium;  Body Fluid  Result Value Ref Range Status   Specimen Description   Final    SYNOVIAL Performed at [facility], [address], [place], [place] [address]    Special Requests   Final    [facility] KNEE Performed at [facility], [address] [place], [place] [address]    Gram Stain   Final    MODERATE WBC PRESENT, PREDOMINANTLY PMN NO ORGANISMS SEEN Performed at [facility], [address] [place], [place] [address]    Culture PENDING  Incomplete   Report Status PENDING  Incomplete         Radiology Studies: MR ANKLE LEFT W WO CONTRAST  Result Date: 07/17/2022 CLINICAL DATA:  Ankle pain, chronic inflammatory arthritis suspected EXAM: MRI OF THE LEFT ANKLE WITHOUT AND WITH CONTRAST TECHNIQUE: Multiplanar, multisequence MR imaging of the ankle was performed before and after the administration of intravenous contrast. CONTRAST:  17mL GADAVIST GADOBUTROL 1 MMOL/ML IV SOLN COMPARISON:  None Available. FINDINGS: TENDONS Peroneal: Peroneal longus tendon  intact. Peroneal brevis intact. Fluid tracking along the peroneal tendons suggesting tenosynovitis Posteromedial: Posterior tibial tendon intact. Flexor hallucis longus tendon intact. Flexor digitorum longus tendon intact. Anterior: Tibialis anterior tendon intact with small amount of surrounding fluid suggesting tenosynovitis. Extensor hallucis longus tendon intact Extensor digitorum longus tendon intact. Achilles: Thickening of the Achilles tendon with mild surrounding edema as well as calcaneal edema about its insertion concerning for tendinosis with peritenonitis. No evidence of tear. Plantar Fascia: Intact. LIGAMENTS Lateral: Anterior talofibular ligament intact. Calcaneofibular ligament intact. Posterior talofibular ligament intact with edema of the talus about its insertion. Anterior and posterior tibiofibular ligaments intact. Medial: Deltoid ligament intact. Spring ligament intact. CARTILAGE Ankle Joint: Trace joint effusion. Normal ankle mortise. Osteochondral injury about the lateral aspect of the talar dome Subtalar Joints/Sinus Tarsi: Normal subtalar joints. No subtalar joint effusion. Normal sinus tarsi. Bones: Bone marrow edema of the lateral cuneiform bone and fourth metatarsal base. No fracture or dislocation. Soft Tissue: Generalized soft tissue swelling about the medial and lateral aspect of the ankle and foot. No fluid collection or hematoma no fluid collection or hematoma. Muscles are normal without edema or atrophy. Tarsal tunnel is normal. IMPRESSION: IMPRESSION 1. Bone marrow edema of the lateral cuneiform bone and fourth metatarsal base concerning for bone contusion/stress injury. No evidence of fracture or dislocation. 2. Thickening of the Achilles tendon with mild surrounding edema concerning for tendinosis with peritenonitis. 3. Osteochondral injury about the lateral aspect of the talar dome. 4. Edema of the posterior talofibular ligament about its insertion concerning for sprain. 5.  Tenosynovitis of the peroneal tendons and tibialis anterior tendon. 6. Generalized soft tissue swelling about the ankle and foot without evidence of fluid collection or hematoma. 7. No MR evidence of septic arthritis or osteomyelitis. Electronically Signed   By: Larose Hires D.O.   On: 07/17/2022 21:11   MR KNEE LEFT W WO CONTRAST  Result Date: 07/17/2022 CLINICAL DATA:  Septic arthritis suspected 62 years old male with coronary artery disease. EXAM: MRI OF THE LEFT KNEE WITHOUT AND WITH CONTRAST TECHNIQUE: Multiplanar, multisequence MR imaging of the left knee was performed both before and after administration of intravenous contrast. CONTRAST:  37mL GADAVIST GADOBUTROL 1 MMOL/ML IV SOLN COMPARISON:  Radiographs dated July 17, 2022 FINDINGS: MENISCI Medial: Horizontal oblique undersurface tear of the posterior horn of the medial meniscus with small parameniscal cyst. Lateral: Intact. LIGAMENTS Cruciates: ACL and PCL are intact. Collaterals: Mild edema along the  medial collateral ligament suggesting ligamentous sprain without tear lateral collateral ligament complex is intact. CARTILAGE Patellofemoral: Focal full-thickness cartilage defect of the lateral trochlea with subchondral cyst Medial: Focal cartilage defect with cystic change of the non weight-bearing area of the medial femoral condyle. Lateral:  No chondral defect. JOINT: Small joint effusion. Normal Hoffa's fat-pad. Mild synovial thickening and enhancement. POPLITEAL FOSSA: Popliteus tendon is intact. No Baker's cyst. Vascular graft of the popliteal artery. EXTENSOR MECHANISM: Intact quadriceps tendon. Thickening of the patellar tendon with mild edema. Intact lateral patellar retinaculum. Intact medial patellar retinaculum. Intact MPFL. BONES: No aggressive osseous lesion. No fracture or dislocation. Other: Mild subcutaneous soft tissue edema. No fluid collection or hematoma. Muscles are normal. IMPRESSION: 1. Marrow signal is within normal limits.  No evidence of osteomyelitis or acute osseous abnormality 2. Horizontal oblique undersurface tear of the posterior horn of the medial meniscus with small parameniscal cyst. 3. Mild edema along the medial collateral ligament suggesting ligamentous sprain without evidence of tear. 4. Mild patellofemoral and medial tibiofemoral osteoarthritis. 5. Small joint effusion with mild synovitis. 6. Mild thickening of the patellar tendon with mild edema, may represent mild tendinosis. Electronically Signed   By: Keane Police D.O.   On: 07/17/2022 20:58   DG Wrist Complete Right  Result Date: 07/17/2022 CLINICAL DATA:  Right wrist pain and swelling. EXAM: RIGHT WRIST - COMPLETE 3+ VIEW COMPARISON:  None Available. FINDINGS: There is no evidence of acute fracture or dislocation. There is no evidence of arthropathy. Old fifth metacarpal fracture is noted. Soft tissues are unremarkable. IMPRESSION: No acute abnormality seen. Electronically Signed   By: Marijo Conception M.D.   On: 07/17/2022 13:20   DG Knee Complete 4 Views Left  Result Date: 07/17/2022 CLINICAL DATA:  Left knee pain and swelling. EXAM: LEFT KNEE - COMPLETE 4+ VIEW COMPARISON:  None Available. FINDINGS: No evidence of fracture, dislocation, or joint effusion. No evidence of arthropathy or other focal bone abnormality. Multiple vascular stents are noted in the soft tissues. IMPRESSION: No acute abnormality seen in the left knee. Electronically Signed   By: Marijo Conception M.D.   On: 07/17/2022 13:19   DG Ankle Complete Left  Result Date: 07/17/2022 CLINICAL DATA:  Left ankle pain and swelling. EXAM: LEFT ANKLE COMPLETE - 3+ VIEW COMPARISON:  None Available. FINDINGS: There is no evidence of fracture, dislocation, or joint effusion. There is no evidence of arthropathy or other focal bone abnormality. Soft tissues are unremarkable. IMPRESSION: Negative. Electronically Signed   By: Marijo Conception M.D.   On: 07/17/2022 13:17   DG Chest 2 View  Result  Date: 07/17/2022 CLINICAL DATA:  Shortness of breath EXAM: CHEST - 2 VIEW COMPARISON:  CXR 07/07/21 FINDINGS: No pleural effusion. No pneumothorax. No focal airspace opacity. No displaced rib fractures. Visualized upper abdomen is unremarkable. Prominent cardiac contours with a possible associated pericardial effusion. IMPRESSION: 1. No focal airspace opacity. 2. Prominent cardiac contours with a possible associated pericardial effusion. Recommend further evaluation with echocardiography. Electronically Signed   By: Marin Roberts M.D.   On: 07/17/2022 12:31        Scheduled Meds:  allopurinol  100 mg Oral Daily   apixaban  5 mg Oral BID   atorvastatin  80 mg Oral QPM   carvedilol  3.125 mg Oral BID WC   furosemide  20 mg Intravenous BID   pantoprazole  40 mg Oral Daily   spironolactone  25 mg Oral BID   Continuous Infusions:  LOS: 1 day   Time spent= 35 mins    Guy Toney Arsenio Loader, MD [facility]  If 7PM-7AM, please contact night-coverage  07/18/2022, 8:30 AM

## 2022-07-18 NOTE — ED Notes (Signed)
ECHO at bedside.

## 2022-07-18 NOTE — Progress Notes (Signed)
*  PRELIMINARY RESULTS* Echocardiogram 2D Echocardiogram has been performed.  Cameron [name] 07/18/2022, 8:19 AM

## 2022-07-18 NOTE — ED Notes (Signed)
Pt blood pressure reading low. Per MD Reesa Chew okay to hold both lasix and aldactone.

## 2022-07-18 NOTE — Progress Notes (Signed)
Tele order expired. Aliene Beams NP to see if she would like to renew order.

## 2022-07-18 NOTE — Evaluation (Signed)
Physical Therapy Evaluation Patient Details Name: Cameron [name] MRN: [id number] DOB: Jan 24, 1961 Today's Date: 07/18/2022  History of Present Illness  62 y/o male presented to ED on 07/17/22 for bilateral leg swelling and difficulty ambulating. S/p L knee aspiration on 1/11. PMH: CAD, Afib, HTN  Clinical Impression  Patient admitted with the above. PTA, patient lives alone and was independent up until recently due to pain and has been using RW with limited mobility. Patient presents with weakness, pain, decreased activity tolerance, and impaired balance. Required modA for bed mobility and minA+2 to stand from slightly elevated bed surface. Only able to take sidesteps towards [facility] with RW and min guard +2 for safety. At his current functional level, patient unable to manage safely at home. Patient will benefit from skilled PT services during acute stay to address listed deficits. Recommend SNF at discharge to maximize functional independence and safety prior to returning home.        Recommendations for follow up therapy are one component of a multi-disciplinary discharge planning process, led by the attending physician.  Recommendations may be updated based on patient status, additional functional criteria and insurance authorization.  Follow Up Recommendations Skilled nursing-short term rehab (<3 hours/day) Can patient physically be transported by private vehicle: No    Assistance Recommended at Discharge Frequent or constant Supervision/Assistance  Patient can return home with the following  A lot of help with walking and/or transfers;A little help with bathing/dressing/bathroom;Assistance with cooking/housework;Assist for transportation;Help with stairs or ramp for entrance    Equipment Recommendations BSC/3in1  Recommendations for Other Services       Functional Status Assessment Patient has had a recent decline in their functional status and demonstrates the ability to make significant  improvements in function in a reasonable and predictable amount of time.     Precautions / Restrictions Precautions Precautions: Fall      Mobility  Bed Mobility Overal bed mobility: Needs Assistance Bed Mobility: Sit to Supine, Supine to Sit     Supine to sit: Mod assist Sit to supine: Mod assist        Transfers Overall transfer level: Needs assistance Equipment used: Rolling Morayo Leven (2 wheels) Transfers: Sit to/from Stand Sit to Stand: +2 physical assistance, Min assist                Ambulation/Gait Ambulation/Gait assistance: Min guard, +2 physical assistance Gait Distance (Feet): 4 Feet Assistive device: Rolling Mallarie Voorhies (2 wheels) Gait Pattern/deviations: Step-to pattern Gait velocity: decreased     General Gait Details: sidesteps towards [facility] with cues for sequencing RW  Stairs            Wheelchair Mobility    Modified Rankin (Stroke Patients Only)       Balance Overall balance assessment: Needs assistance Sitting-balance support: Feet supported Sitting balance-Leahy Scale: Good     Standing balance support: Reliant on assistive device for balance, During functional activity, Bilateral upper extremity supported Standing balance-Leahy Scale: Poor                               Pertinent Vitals/Pain Pain Assessment Pain Assessment: Faces Faces Pain Scale: Hurts even more Pain Location: R hand, L knee and ankle Pain Descriptors / Indicators: Guarding, Grimacing Pain Intervention(s): Monitored during session, Limited activity within patient's tolerance, Repositioned    Home Living Family/patient expects to be discharged to:: Private residence Living Arrangements: Alone   Type of Home: House Home Access:  Stairs to enter Entrance Stairs-Rails: Right;Left;Can reach both Entrance Stairs-Number of Steps: 5   Home Layout: One level Home Equipment: Conservation officer, nature (2 wheels);Rollator (4 wheels);Crutches      Prior Function  Prior Level of Function : Independent/Modified Independent;Driving               ADLs Comments: Pt reports increased difficulty and needing increased time to complete self care tasks and ambulation with AD     Hand Dominance   Dominant Hand: Right    Extremity/Trunk Assessment   Upper Extremity Assessment Upper Extremity Assessment: Defer to OT evaluation RUE Deficits / Details: increased pain and pt reports gout in R hand making transfers more difficult    Lower Extremity Assessment Lower Extremity Assessment: LLE deficits/detail;Generalized weakness LLE Deficits / Details: increased pain in knee and ankle. Patient reporting gout flare up    Cervical / Trunk Assessment Cervical / Trunk Assessment: Normal  Communication   Communication: No difficulties  Cognition Arousal/Alertness: Awake/alert Behavior During Therapy: WFL for tasks assessed/performed Overall Cognitive Status: Within Functional Limits for tasks assessed                                          General Comments General comments (skin integrity, edema, etc.): VSS on RA    Exercises     Assessment/Plan    PT Assessment Patient needs continued PT services  PT Problem List Decreased strength;Decreased range of motion;Decreased activity tolerance;Decreased balance;Decreased mobility;Decreased knowledge of precautions       PT Treatment Interventions DME instruction;Gait training;Stair training;Functional mobility training;Therapeutic activities;Therapeutic exercise;Balance training;Patient/family education    PT Goals (Current goals can be found in the Care Plan section)  Acute Rehab PT Goals Patient Stated Goal: to get this gout out of me PT Goal Formulation: With patient Time For Goal Achievement: 08/01/22 Potential to Achieve Goals: Good    Frequency Min 2X/week     Co-evaluation PT/OT/SLP Co-Evaluation/Treatment: Yes Reason for Co-Treatment: For patient/therapist  safety;To address functional/ADL transfers PT goals addressed during session: Mobility/safety with mobility;Balance OT goals addressed during session: ADL's and self-care       AM-PAC PT "6 Clicks" Mobility  Outcome Measure Help needed turning from your back to your side while in a flat bed without using bedrails?: A Lot Help needed moving from lying on your back to sitting on the side of a flat bed without using bedrails?: A Lot Help needed moving to and from a bed to a chair (including a wheelchair)?: A Lot Help needed standing up from a chair using your arms (e.g., wheelchair or bedside chair)?: A Lot Help needed to walk in hospital room?: Total Help needed climbing 3-5 steps with a railing? : Total 6 Click Score: 10    End of Session   Activity Tolerance: Patient tolerated treatment well;Patient limited by pain Patient left: in bed;with call bell/phone within reach;with bed alarm set Nurse Communication: Mobility status PT Visit Diagnosis: Unsteadiness on feet (R26.81);Muscle weakness (generalized) (M62.81);Difficulty in walking, not elsewhere classified (R26.2)    Time: 5621-3086 PT Time Calculation (min) (ACUTE ONLY): 28 min   Charges:   PT Evaluation $PT Eval Moderate Complexity: 1 Mod          Fredderick Swanger A. Gilford Rile PT, DPT [facility] - Acute Rehabilitation Services   Jaquilla Woodroof A Aubrie Lucien 07/18/2022, 3:28 PM

## 2022-07-18 NOTE — Consult Note (Signed)
   Heart Failure Nurse Navigator Note  HFrEF 20 to 25%.  Left ventricular internal cavity is mild to moderately dilated.  Indeterminate diastolic dysfunction.  Mild left atrial enlargement.  Mild to moderate mitral regurgitation.  Echocardiogram performed on this admission results are pending.  He presented to the emergency room with complaint plaints of bilateral leg swelling and joint pain.  BNP 1196.  Lactic acid 1.6.  Chest x-ray revealed questionable pericardial effusion.  Comorbidities:  Neri artery disease Atrial fibrillation Hyperlipidemia and GERD Tobacco abuse  Medications:  Apixaban 5 mg 2 times a day Atorvastatin 80 mg daily Carvedilol 3.125 mg 2 times a day with meals NicoDerm patch 14 mg daily Potassium chloride 40 mEq Spironolactone 25 mg daily  He was given 1 dose of Lasix 40 mg IV then is to start furosemide 20 mg IV 2 times a day.  Labs:  Sodium 137, potassium 3, chloride 98, CO2 29, BUN 15, creatinine 1.24, GFR 60.  Initial meeting with patient in the emergency room.  He had just finished a breathing treatment.  He states that he is feeling a lot better than when he came in.  Discussed heart failure and what it means.  He states that he continues to use salt.  Explained the relationship between salt/sodium and fluids.  Made aware of eating no more than 2000 mg of sodium daily and removing the saltshaker from the table.  Also discussed fluid restriction of 64 ounces.  He states that he drinks approximately 80 ounces of fluids daily, instructed to cut back on what he is taking and and also to be if he drinks sodas to eat drink clear sodas.  States that he has a scale and discussed weighing daily and reporting 2 pound weight gain overnight or total of 5 pounds within the week.  He states that he continues to work full-time.  Also stressed the importance of following up in the outpatient heart failure clinic for which she has an appointment on January 16 at 11  AM.  He has a 19% no-show which is 8 out of 42 appointments.  He had no further questions.  Pricilla Riffle RN [facility]

## 2022-07-18 NOTE — ED Notes (Signed)
Assumed care from Emily, RN. Pt resting comfortably in bed at this time. Pt denies any current needs or questions. Call light with in reach.   

## 2022-07-19 DIAGNOSIS — I509 Heart failure, unspecified: Secondary | ICD-10-CM | POA: Diagnosis not present

## 2022-07-19 LAB — CBC
HCT: 34.4 % — ABNORMAL LOW (ref 39.0–52.0)
Hemoglobin: 10.4 g/dL — ABNORMAL LOW (ref 13.0–17.0)
MCH: 28.7 pg (ref 26.0–34.0)
MCHC: 30.2 g/dL (ref 30.0–36.0)
MCV: 95 fL (ref 80.0–100.0)
Platelets: 279 10*3/uL (ref 150–400)
RBC: 3.62 MIL/uL — ABNORMAL LOW (ref 4.22–5.81)
RDW: 18.8 % — ABNORMAL HIGH (ref 11.5–15.5)
WBC: 13.3 10*3/uL — ABNORMAL HIGH (ref 4.0–10.5)
nRBC: 0 % (ref 0.0–0.2)

## 2022-07-19 LAB — BASIC METABOLIC PANEL
Anion gap: 9 (ref 5–15)
BUN: 19 mg/dL (ref 6–20)
CO2: 26 mmol/L (ref 22–32)
Calcium: 8.4 mg/dL — ABNORMAL LOW (ref 8.9–10.3)
Chloride: 99 mmol/L (ref 98–111)
Creatinine, Ser: 1.3 mg/dL — ABNORMAL HIGH (ref 0.61–1.24)
GFR, Estimated: >60 mL/min (ref >60)
Glucose, Bld: 92 mg/dL (ref 70–99)
Potassium: 4 mmol/L (ref 3.5–5.1)
Sodium: 134 mmol/L — ABNORMAL LOW (ref 135–145)

## 2022-07-19 LAB — ECHOCARDIOGRAM COMPLETE
AR max vel: 2.59 cm2
AV Area VTI: 2.39 cm2
AV Area mean vel: 2.36 cm2
AV Mean grad: 4 mmHg
AV Peak grad: 7.1 mmHg
Ao pk vel: 1.33 m/s
Area-P 1/2: 4.71 cm2
Calc EF: 32.4 %
Height: 67 in
S' Lateral: 4.3 cm
Single Plane A2C EF: 30.8 %
Single Plane A4C EF: 39.8 %
Weight: 2959.46 oz

## 2022-07-19 LAB — BRAIN NATRIURETIC PEPTIDE: B Natriuretic Peptide: 1577.8 pg/mL — ABNORMAL HIGH (ref 0.0–100.0)

## 2022-07-19 LAB — MAGNESIUM: Magnesium: 2.2 mg/dL (ref 1.7–2.4)

## 2022-07-19 NOTE — Plan of Care (Signed)

## 2022-07-19 NOTE — Progress Notes (Signed)
PROGRESS NOTE    Cameron [name]  BTD:[id number] DOB: 01-29-1961 DOA: 07/17/2022 PCP: [facility]   Brief Narrative:  62 year old male with coronary artery disease, atrial fibrillation, hyperlipidemia, CAD, GERD, who presents emergency department for chief concerns of bilateral lower extremity swelling and multiple joint pain.  Admitted with concerns of CHF and possible septic arthritis.  Outpatient PCP hide patient has gout therefore received a course of colchicine and thereafter prescribed allopurinol which he has not started.  Arthrocentesis was performed by orthopedic which showed elevated WBC and was suspected to be secondary to acute gout.  No organism were seen on micro.  In the meantime getting IV diuretics for CHF exacerbation.   Assessment & Plan:  Principal Problem:   Acute exacerbation of CHF (congestive heart failure) (HCC) Active Problems:   Essential hypertension   Hyperlipidemia   CAD (coronary artery disease), native coronary artery   Tobacco abuse   Acute gouty arthritis   Leukocytosis     Assessment and Plan: * Acute exacerbation of CHF (congestive heart failure) (HCC), with reduced ejection fraction, 25% Elevated BNP -Patient with elevated BNP, getting IV diuretics.  Echocardiogram results pending.   Chest x-ray showing possible small pericardial effusion.  Soft BP, therefore okay to hold Coreg and Aldactone if needed  Leukocytosis - Etiology is unclear at this time, this could be secondary to steroid use  Multiple joint complaints -X-ray of his left ankle right wrist and left knee is unremarkable.   But MRI of the left ankle shows concerns about contusion/injury, tendinosis with peritendinitis, osteochondral injury of the talar dome, ankle sprain and generalized swelling.  There is no obvious evidence of fracture, dislocation, arthritis or osteomyelitis. MRI of the knee shows mild tear of medial meniscus, ligament sprain, osteoarthritis, small  joint effusion.  No obvious evidence of osteomyelitis or fracture Orthopedics suspecting acute gout flare. Left knee aspiration shows elevated WBC but no organisms seen on Gram stain, follow cultures.  Discussed with Dr. Renold Don from ID, consistent with inflammatory arthritis.  Low suspicion for infection.  Hypokalemia - Repletion  Paroxysmal atrial fibrillation - Coreg, Eliquis  Tobacco abuse - As needed nicotine patch ordered  CAD (coronary artery disease), native coronary artery Ischemic cardiomyopathy - Coreg, Aldactone, statin  Hyperlipidemia - Atorvastatin 80 mg daily resumed  Essential hypertension - Carvedilol 3.125 mg p.o. twice daily, spironolactone 25 mg p.o. twice daily resumed IV as needed    DVT prophylaxis: Eliquis Code Status: Full Family Communication:   Status is: Inpatient Ongoing evaluation for low-grade fever, getting diuretics.  PT/OT rec SNF  Subjective: Low-grade fever.  No other complaints this morning.  Feels little better   Examination:  Constitutional: Not in acute distress Respiratory: Bibasilar crackles Cardiovascular: Normal sinus rhythm, no rubs Abdomen: Nontender nondistended good bowel sounds Musculoskeletal: Left knee slightly more swollen than the right.  Limited flexion. Skin: No rashes seen Neurologic: CN 2-12 grossly intact.  And nonfocal Psychiatric: Normal judgment and insight. Alert and oriented x 3. Normal mood.  Objective: Vitals:   07/19/22 0000 07/19/22 0425 07/19/22 0809 07/19/22 0822  BP:  94/68 (!) 89/61 (!) 88/61  Pulse:  80 78 78  Resp:  18 18 18   Temp: 99 F (37.2 C) 99.2 F (37.3 C) 98.9 F (37.2 C) 98.4 F (36.9 C)  TempSrc: Oral Oral Oral Oral  SpO2:  94% 95% 91%  Weight:      Height:        Intake/Output Summary (Last 24 hours) at  07/19/2022 0850 Last data filed at 07/19/2022 0800 Gross per 24 hour  Intake 480 ml  Output 1050 ml  Net -570 ml   Filed Weights   07/17/22 1602  Weight: 83.9 kg      Data Reviewed:   CBC: Recent Labs  Lab 07/17/22 0936 07/18/22 0449 07/19/22 0522  WBC 18.2* 13.7* 13.3*  HGB 12.6* 10.8* 10.4*  HCT 41.1 34.9* 34.4*  MCV 94.7 93.8 95.0  PLT 270 223 616   Basic Metabolic Panel: Recent Labs  Lab 07/17/22 0936 07/17/22 1525 07/18/22 0449 07/19/22 0522  NA 136  --  137 134*  K 3.3*  --  3.0* 4.0  CL 96*  --  98 99  CO2 25  --  29 26  GLUCOSE 97  --  100* 92  BUN 13  --  15 19  CREATININE 1.30*  --  1.24 1.30*  CALCIUM 9.0  --  8.2* 8.4*  MG  --  2.2  --  2.2   GFR: Estimated Creatinine Clearance: 62.6 mL/min (A) (by C-G formula based on SCr of 1.3 mg/dL (H)). Liver Function Tests: Recent Labs  Lab 07/17/22 1202  AST 19  ALT 8  ALKPHOS 105  BILITOT 1.8*  PROT 8.0  ALBUMIN 3.3*   No results for input(s): "LIPASE", "AMYLASE" in the last 168 hours. No results for input(s): "AMMONIA" in the last 168 hours. Coagulation Profile: No results for input(s): "INR", "PROTIME" in the last 168 hours. Cardiac Enzymes: No results for input(s): "CKTOTAL", "CKMB", "CKMBINDEX", "TROPONINI" in the last 168 hours. BNP (last 3 results) No results for input(s): "PROBNP" in the last 8760 hours. HbA1C: No results for input(s): "HGBA1C" in the last 72 hours. CBG: Recent Labs  Lab 07/17/22 1232  GLUCAP 102*   Lipid Profile: No results for input(s): "CHOL", "HDL", "LDLCALC", "TRIG", "CHOLHDL", "LDLDIRECT" in the last 72 hours. Thyroid Function Tests: No results for input(s): "TSH", "T4TOTAL", "FREET4", "T3FREE", "THYROIDAB" in the last 72 hours. Anemia Panel: No results for input(s): "VITAMINB12", "FOLATE", "FERRITIN", "TIBC", "IRON", "RETICCTPCT" in the last 72 hours. Sepsis Labs: Recent Labs  Lab 07/17/22 1327 07/17/22 1525  LATICACIDVEN 1.6 2.2*    Recent Results (from the past 240 hour(s))  Blood culture (routine x 2)     Status: None (Preliminary result)   Collection Time: 07/17/22  1:27 PM   Specimen: BLOOD  Result Value  Ref Range Status   Specimen Description BLOOD  RIGHT ARM  Final   Special Requests   Final    BOTTLES DRAWN AEROBIC AND ANAEROBIC Blood Culture results may not be optimal due to an excessive volume of blood received in culture bottles   Culture   Final    NO GROWTH 2 DAYS Performed at [facility], [address] [place], [place] [address]    Report Status PENDING  Incomplete  Blood culture (routine x 2)     Status: None (Preliminary result)   Collection Time: 07/17/22  1:27 PM   Specimen: BLOOD  Result Value Ref Range Status   Specimen Description BLOOD  RIGHT HAND  Final   Special Requests   Final    BOTTLES DRAWN AEROBIC AND ANAEROBIC Blood Culture results may not be optimal due to an inadequate volume of blood received in culture bottles   Culture   Final    NO GROWTH 2 DAYS Performed at [facility], [address], [place], [place] [address]    Report Status PENDING  Incomplete  Body  fluid culture w Gram Stain     Status: None (Preliminary result)   Collection Time: 07/17/22  3:25 PM   Specimen: Synovium; Body Fluid  Result Value Ref Range Status   Specimen Description   Final    SYNOVIAL Performed at [facility], [address] [place], [place] [address]    Special Requests   Final    [facility] KNEE Performed at [facility], [address] [place], [place] [address]    Gram Stain   Final    MODERATE WBC PRESENT, PREDOMINANTLY PMN NO ORGANISMS SEEN    Culture   Final    NO GROWTH 2 DAYS Performed at [facility] [address] [place], [place] [address]    Report Status PENDING  Incomplete         Radiology Studies: MR ANKLE LEFT W WO CONTRAST  Result Date: 07/17/2022 CLINICAL DATA:  Ankle pain, chronic inflammatory arthritis suspected EXAM: MRI OF THE LEFT ANKLE WITHOUT AND WITH CONTRAST TECHNIQUE: Multiplanar, multisequence MR imaging of the ankle was performed before and after the administration of  intravenous contrast. CONTRAST:  44mL GADAVIST GADOBUTROL 1 MMOL/ML IV SOLN COMPARISON:  None Available. FINDINGS: TENDONS Peroneal: Peroneal longus tendon intact. Peroneal brevis intact. Fluid tracking along the peroneal tendons suggesting tenosynovitis Posteromedial: Posterior tibial tendon intact. Flexor hallucis longus tendon intact. Flexor digitorum longus tendon intact. Anterior: Tibialis anterior tendon intact with small amount of surrounding fluid suggesting tenosynovitis. Extensor hallucis longus tendon intact Extensor digitorum longus tendon intact. Achilles: Thickening of the Achilles tendon with mild surrounding edema as well as calcaneal edema about its insertion concerning for tendinosis with peritenonitis. No evidence of tear. Plantar Fascia: Intact. LIGAMENTS Lateral: Anterior talofibular ligament intact. Calcaneofibular ligament intact. Posterior talofibular ligament intact with edema of the talus about its insertion. Anterior and posterior tibiofibular ligaments intact. Medial: Deltoid ligament intact. Spring ligament intact. CARTILAGE Ankle Joint: Trace joint effusion. Normal ankle mortise. Osteochondral injury about the lateral aspect of the talar dome Subtalar Joints/Sinus Tarsi: Normal subtalar joints. No subtalar joint effusion. Normal sinus tarsi. Bones: Bone marrow edema of the lateral cuneiform bone and fourth metatarsal base. No fracture or dislocation. Soft Tissue: Generalized soft tissue swelling about the medial and lateral aspect of the ankle and foot. No fluid collection or hematoma no fluid collection or hematoma. Muscles are normal without edema or atrophy. Tarsal tunnel is normal. IMPRESSION: IMPRESSION 1. Bone marrow edema of the lateral cuneiform bone and fourth metatarsal base concerning for bone contusion/stress injury. No evidence of fracture or dislocation. 2. Thickening of the Achilles tendon with mild surrounding edema concerning for tendinosis with peritenonitis. 3.  Osteochondral injury about the lateral aspect of the talar dome. 4. Edema of the posterior talofibular ligament about its insertion concerning for sprain. 5. Tenosynovitis of the peroneal tendons and tibialis anterior tendon. 6. Generalized soft tissue swelling about the ankle and foot without evidence of fluid collection or hematoma. 7. No MR evidence of septic arthritis or osteomyelitis. Electronically Signed   By: Keane Police D.O.   On: 07/17/2022 21:11   MR KNEE LEFT W WO CONTRAST  Result Date: 07/17/2022 CLINICAL DATA:  Septic arthritis suspected 62 years old male with coronary artery disease. EXAM: MRI OF THE LEFT KNEE WITHOUT AND WITH CONTRAST TECHNIQUE: Multiplanar, multisequence MR imaging of the left knee was performed both before and after administration of intravenous contrast. CONTRAST:  42mL GADAVIST GADOBUTROL 1 MMOL/ML IV SOLN COMPARISON:  Radiographs dated July 17, 2022  FINDINGS: MENISCI Medial: Horizontal oblique undersurface tear of the posterior horn of the medial meniscus with small parameniscal cyst. Lateral: Intact. LIGAMENTS Cruciates: ACL and PCL are intact. Collaterals: Mild edema along the medial collateral ligament suggesting ligamentous sprain without tear lateral collateral ligament complex is intact. CARTILAGE Patellofemoral: Focal full-thickness cartilage defect of the lateral trochlea with subchondral cyst Medial: Focal cartilage defect with cystic change of the non weight-bearing area of the medial femoral condyle. Lateral:  No chondral defect. JOINT: Small joint effusion. Normal Hoffa's fat-pad. Mild synovial thickening and enhancement. POPLITEAL FOSSA: Popliteus tendon is intact. No Baker's cyst. Vascular graft of the popliteal artery. EXTENSOR MECHANISM: Intact quadriceps tendon. Thickening of the patellar tendon with mild edema. Intact lateral patellar retinaculum. Intact medial patellar retinaculum. Intact MPFL. BONES: No aggressive osseous lesion. No fracture or  dislocation. Other: Mild subcutaneous soft tissue edema. No fluid collection or hematoma. Muscles are normal. IMPRESSION: 1. Marrow signal is within normal limits. No evidence of osteomyelitis or acute osseous abnormality 2. Horizontal oblique undersurface tear of the posterior horn of the medial meniscus with small parameniscal cyst. 3. Mild edema along the medial collateral ligament suggesting ligamentous sprain without evidence of tear. 4. Mild patellofemoral and medial tibiofemoral osteoarthritis. 5. Small joint effusion with mild synovitis. 6. Mild thickening of the patellar tendon with mild edema, may represent mild tendinosis. Electronically Signed   By: Larose Hires D.O.   On: 07/17/2022 20:58   DG Wrist Complete Right  Result Date: 07/17/2022 CLINICAL DATA:  Right wrist pain and swelling. EXAM: RIGHT WRIST - COMPLETE 3+ VIEW COMPARISON:  None Available. FINDINGS: There is no evidence of acute fracture or dislocation. There is no evidence of arthropathy. Old fifth metacarpal fracture is noted. Soft tissues are unremarkable. IMPRESSION: No acute abnormality seen. Electronically Signed   By: Lupita Raider M.D.   On: 07/17/2022 13:20   DG Knee Complete 4 Views Left  Result Date: 07/17/2022 CLINICAL DATA:  Left knee pain and swelling. EXAM: LEFT KNEE - COMPLETE 4+ VIEW COMPARISON:  None Available. FINDINGS: No evidence of fracture, dislocation, or joint effusion. No evidence of arthropathy or other focal bone abnormality. Multiple vascular stents are noted in the soft tissues. IMPRESSION: No acute abnormality seen in the left knee. Electronically Signed   By: Lupita Raider M.D.   On: 07/17/2022 13:19   DG Ankle Complete Left  Result Date: 07/17/2022 CLINICAL DATA:  Left ankle pain and swelling. EXAM: LEFT ANKLE COMPLETE - 3+ VIEW COMPARISON:  None Available. FINDINGS: There is no evidence of fracture, dislocation, or joint effusion. There is no evidence of arthropathy or other focal bone  abnormality. Soft tissues are unremarkable. IMPRESSION: Negative. Electronically Signed   By: Lupita Raider M.D.   On: 07/17/2022 13:17   DG Chest 2 View  Result Date: 07/17/2022 CLINICAL DATA:  Shortness of breath EXAM: CHEST - 2 VIEW COMPARISON:  CXR 07/07/21 FINDINGS: No pleural effusion. No pneumothorax. No focal airspace opacity. No displaced rib fractures. Visualized upper abdomen is unremarkable. Prominent cardiac contours with a possible associated pericardial effusion. IMPRESSION: 1. No focal airspace opacity. 2. Prominent cardiac contours with a possible associated pericardial effusion. Recommend further evaluation with echocardiography. Electronically Signed   By: Lorenza Cambridge M.D.   On: 07/17/2022 12:31        Scheduled Meds:  allopurinol  100 mg Oral Daily   apixaban  5 mg Oral BID   atorvastatin  80 mg Oral QPM   carvedilol  3.125 mg Oral BID WC   furosemide  20 mg Intravenous BID   ipratropium-albuterol  3 mL Nebulization BID   pantoprazole  40 mg Oral Daily   spironolactone  25 mg Oral BID   Continuous Infusions:   LOS: 2 days   Time spent= 35 mins    Joeli Fenner Joline Maxcy, MD [facility]  If 7PM-7AM, please contact night-coverage  07/19/2022, 8:50 AM

## 2022-07-19 NOTE — Progress Notes (Signed)
Patient is alert and oriented x4. States that he is independent at home w/o devices but since he has been having joint pain/swelling, he is now limited. Had low grade fevers all night. L-leg is more swollen than right but swelling noted in all lower leg joints. Synovial fluid aspirated and sent for culture. Blood cultures pending as well. Patient has some concerns as to why he has not had abx-will consult with day team. He received prn po pain meds for joint pain. Refused nicotine patch, account password and vaccines. States that his vaccines are UTD. Reports a decreased appetite and a bowel movement on 1/10. Lives alone but states that his grandson Cameron [name] is over all of his affairs. BP is still soft but stable. Denied additional needs.

## 2022-07-20 DIAGNOSIS — I5043 Acute on chronic combined systolic (congestive) and diastolic (congestive) heart failure: Secondary | ICD-10-CM | POA: Diagnosis not present

## 2022-07-20 LAB — BASIC METABOLIC PANEL
Anion gap: 10 (ref 5–15)
BUN: 20 mg/dL (ref 6–20)
CO2: 25 mmol/L (ref 22–32)
Calcium: 8.3 mg/dL — ABNORMAL LOW (ref 8.9–10.3)
Chloride: 100 mmol/L (ref 98–111)
Creatinine, Ser: 1.27 mg/dL — ABNORMAL HIGH (ref 0.61–1.24)
GFR, Estimated: >60 mL/min (ref >60)
Glucose, Bld: 94 mg/dL (ref 70–99)
Potassium: 4.1 mmol/L (ref 3.5–5.1)
Sodium: 135 mmol/L (ref 135–145)

## 2022-07-20 LAB — CBC
HCT: 32.6 % — ABNORMAL LOW (ref 39.0–52.0)
Hemoglobin: 10.1 g/dL — ABNORMAL LOW (ref 13.0–17.0)
MCH: 28.9 pg (ref 26.0–34.0)
MCHC: 31 g/dL (ref 30.0–36.0)
MCV: 93.1 fL (ref 80.0–100.0)
Platelets: 289 10*3/uL (ref 150–400)
RBC: 3.5 MIL/uL — ABNORMAL LOW (ref 4.22–5.81)
RDW: 18.8 % — ABNORMAL HIGH (ref 11.5–15.5)
WBC: 11.2 10*3/uL — ABNORMAL HIGH (ref 4.0–10.5)
nRBC: 0 % (ref 0.0–0.2)

## 2022-07-20 LAB — MAGNESIUM: Magnesium: 2.2 mg/dL (ref 1.7–2.4)

## 2022-07-20 MED ORDER — PREDNISONE 20 MG PO TABS
40.0000 mg | ORAL_TABLET | Freq: Every day | ORAL | Status: DC
  Administered 2022-07-21 – 2022-07-23 (×3): 40 mg via ORAL
  Filled 2022-07-20 (×3): qty 2

## 2022-07-20 NOTE — TOC Progression Note (Signed)
Transition of Care [facility]) - Progression Note    Patient Details  Name: Cameron [name] MRN: [id number] Date of Birth: 02/15/1961  [facility] of [facility]) CM/SW Contact  Cameron Daniel Dorian Pod, RN Phone Number:[phone] 07/20/2022, 2:39 PM  Clinical Narrative:    Spoke with pt today concerning recommendations for SNF. Pt receptive and indicated based upon choice of facilities the [place] and [place] facilities for choice. Pt states he would contact his son Rodman Key concerning this action plan for discharge disposition. Pt reports a good support system and use of a walker when needed. Pt will need EMS upon transfer to the SNF once accepted. FL2/Bedsearch as been started with the reported PASRR# [id number]  No other inquires or needs presented at this time. TOC remains available to assist.        Expected Discharge Plan and Services                                               Social Determinants of Health (SDOH) Interventions SDOH Screenings   Food Insecurity: No Food Insecurity (07/18/2022)  Housing: Low Risk  (07/18/2022)  Transportation Needs: No Transportation Needs (07/18/2022)  Utilities: Not At Risk (07/18/2022)  Tobacco Use: High Risk (07/18/2022)    Readmission Risk Interventions    04/04/2021    3:14 PM  Readmission Risk Prevention Plan  Transportation Screening Complete  PCP or Specialist Appt within 3-5 Days Complete  Social Work Consult for [place] Planning/Counseling Complete  Palliative Care Screening Not Applicable  Medication Review Press photographer) Complete

## 2022-07-20 NOTE — NC FL2 (Signed)
[facility] LEVEL OF CARE FORM     IDENTIFICATION  Patient Name: Cameron [name] Birthdate: [birth year] Sex: male Admission Date (Current Location): 07/17/2022  [facility] [place] Number:  Engineering geologist and Address:  [facility], [address] [place], [place] [address]      Provider Number: [id number]  Attending Physician Name and Address:  Ezekiel Slocumb, DO  Relative Name and Phone Number:       Current Level of Care: SNF Recommended Level of Care: [place] Prior Approval Number:    Date Approved/Denied: 07/20/22 PASRR Number: [id number]  Discharge Plan: SNF    Current Diagnoses: Patient Active Problem List   Diagnosis Date Noted   Acute on chronic combined systolic and diastolic CHF (congestive heart failure) ([place]) 07/17/2022   Acute calculous cholecystitis 03/31/2021   Dry gangrene ([place])    Cellulitis of left foot 08/17/2020   Pressure injury of skin 07/24/2020   Acute gouty arthritis 28/41/3244   Chronic systolic CHF (congestive heart failure) ([place]) 07/24/2020   Atrial fibrillation, chronic ([place]) 07/24/2020   Iron deficiency anemia 07/24/2020   Leukocytosis 07/24/2020   CKD (chronic kidney disease), stage II    Hypotension    Acute on chronic respiratory failure with hypoxia (HCC)    PVD (peripheral vascular disease) ([place])    Acute renal failure superimposed on stage 3a chronic kidney disease ([place])    Macrocytic anemia    Atherosclerosis of native arteries of extremity with rest pain ([place]) 06/19/2020   Acute on chronic systolic CHF (congestive heart failure) ([place]) 02/17/2020   Hypokalemia 02/17/2020   Hypomagnesemia 02/17/2020   Elevated troponin 02/17/2020   New onset atrial fibrillation ([place]) 02/17/2020   Olecranon bursitis of left elbow 02/17/2020   NSVT (nonsustained ventricular tachycardia) ([place]) 02/17/2020   Tobacco abuse 02/17/2020   Apical mural thrombus 02/17/2020   Gout_left  elbow 02/17/2020   Bilateral leg edema 10/19/2018   Weight gain 10/19/2018   Chest pain 02/02/2018   Acute pain of right knee 03/13/2017   Essential hypertension 06/20/2016   Hyperlipidemia 06/20/2016   CAD (coronary artery disease), native coronary artery 06/20/2016   STEMI involving oth coronary artery of anterior wall ([place]) 06/17/2016   STEMI (ST elevation myocardial infarction) ([place]) 06/17/2016    Orientation RESPIRATION BLADDER Height & Weight     Self, Time, Situation, Place  Normal Continent (Urinal) Weight: 83.9 kg Height:  5\' 7"  (170.2 cm)  BEHAVIORAL SYMPTOMS/MOOD NEUROLOGICAL BOWEL NUTRITION STATUS      Continent Diet  AMBULATORY STATUS COMMUNICATION OF NEEDS Skin   Extensive Assist Verbally Normal                       Personal Care Assistance Level of Assistance  Bathing, Dressing, Feeding Bathing Assistance: Limited assistance Feeding assistance: Limited assistance Dressing Assistance: Limited assistance     Functional Limitations Info             SPECIAL CARE FACTORS FREQUENCY  PT (By licensed PT), OT (By licensed OT)     PT Frequency: 5 X Weekly OT Frequency: 5 X Weekly            Contractures      Additional Factors Info                  Current Medications (07/20/2022):  This is the current hospital active medication list Current Facility-Administered Medications  Medication Dose Route Frequency Provider Last Rate  Last Admin   acetaminophen (TYLENOL) tablet 650 mg  650 mg Oral Q6H PRN Cox, Amy N, DO   650 mg at 07/20/22 1153   Or   acetaminophen (TYLENOL) suppository 650 mg  650 mg Rectal Q6H PRN Cox, Amy N, DO       allopurinol (ZYLOPRIM) tablet 100 mg  100 mg Oral Daily Cox, Amy N, DO   100 mg at 07/20/22 0758   apixaban (ELIQUIS) tablet 5 mg  5 mg Oral BID Cox, Amy N, DO   5 mg at 07/20/22 0758   atorvastatin (LIPITOR) tablet 80 mg  80 mg Oral QPM Cox, Amy N, DO   80 mg at 07/19/22 1703   carvedilol (COREG) tablet 3.125 mg   3.125 mg Oral BID WC Amin, Ankit Chirag, MD   3.125 mg at 07/20/22 1151   furosemide (LASIX) injection 20 mg  20 mg Intravenous BID Amin, Ankit Chirag, MD   20 mg at 07/20/22 0759   guaiFENesin (ROBITUSSIN) 100 MG/5ML liquid 5 mL  5 mL Oral Q4H PRN Amin, Ankit Chirag, MD       hydrALAZINE (APRESOLINE) injection 10 mg  10 mg Intravenous Q4H PRN Amin, Ankit Chirag, MD       ipratropium-albuterol (DUONEB) 0.5-2.5 (3) MG/3ML nebulizer solution 3 mL  3 mL Nebulization Q4H PRN Amin, Ankit Chirag, MD       metoprolol tartrate (LOPRESSOR) injection 5 mg  5 mg Intravenous Q4H PRN Amin, Ankit Chirag, MD       nicotine (NICODERM CQ - dosed in mg/24 hours) patch 14 mg  14 mg Transdermal Daily PRN Cox, Amy N, DO       nitroGLYCERIN (NITROSTAT) SL tablet 0.4 mg  0.4 mg Sublingual Q5 min PRN Cox, Amy N, DO       ondansetron (ZOFRAN) tablet 4 mg  4 mg Oral Q6H PRN Cox, Amy N, DO       Or   ondansetron (ZOFRAN) injection 4 mg  4 mg Intravenous Q6H PRN Cox, Amy N, DO       pantoprazole (PROTONIX) EC tablet 40 mg  40 mg Oral Daily Cox, Amy N, DO   40 mg at 07/20/22 0758   [START ON 07/21/2022] predniSONE (DELTASONE) tablet 40 mg  40 mg Oral Q breakfast Nicole Kindred A, DO       senna-docusate (Senokot-S) tablet 1 tablet  1 tablet Oral QHS PRN Cox, Amy N, DO       spironolactone (ALDACTONE) tablet 25 mg  25 mg Oral BID Cox, Amy N, DO   25 mg at 07/20/22 4008     Discharge Medications: Please see discharge summary for a list of discharge medications.  Relevant Imaging Results:  Relevant Lab Results:   Additional Information SS# [id number]  Harriet Masson, RN

## 2022-07-20 NOTE — Progress Notes (Addendum)
PROGRESS NOTE    Cameron [name]  VQQ:[id number] DOB: 1960-11-20 DOA: 07/17/2022 PCP: [facility]   Brief Narrative:  62 year old male with coronary artery disease, atrial fibrillation, hyperlipidemia, CAD, GERD, who presents emergency department for chief concerns of bilateral lower extremity swelling and multiple joint pain.  Admitted with concerns of CHF and possible septic arthritis.  Outpatient PCP hide patient has gout therefore received a course of colchicine and thereafter prescribed allopurinol which he has not started.  Arthrocentesis was performed by orthopedic which showed elevated WBC and was suspected to be secondary to acute gout.  No organism were seen on micro.  In the meantime getting IV diuretics for CHF exacerbation.   Assessment & Plan:  Principal Problem:   Acute on chronic combined systolic and diastolic CHF (congestive heart failure) (HCC) Active Problems:   Essential hypertension   Hyperlipidemia   CAD (coronary artery disease), native coronary artery   Tobacco abuse   Acute gouty arthritis   Leukocytosis     Assessment and Plan: * Acute exacerbation of CHF (congestive heart failure) ([place]), with reduced ejection fraction, 25% Elevated BNP -Patient with elevated BNP, started on IV diuretics.   Net IO Since Admission: -2,730 mL [07/20/22 1306] --Resume IV Lasix for at least one more day --Monitor fluid retention with steroid use --Echocardiogram 1/12 -- EF 40-45% (improved from 20-25% in 2021), grade II diastolic dysfunction    --Chest x-ray showing possible small pericardial effusion.   --Soft BP, therefore okay to hold Coreg and Aldactone if needed  Leukocytosis - Suspect due to inflammation from gout flare  Acute Gout Flare with Polyarthralgia Pt has multiple joints with exquisite tenderness, swelling, erythema and warmth.  Tophi noted on left elbow, right forearm.  Pt reports no improvement with colchicine recently. X-ray of his  left ankle right wrist and left knee unremarkable.   MRI of the left ankle - concerns for contusion/injury, tendinosis with peritendinitis, osteochondral injury of the talar dome, ankle sprain and generalized swelling.  There is no obvious evidence of fracture, dislocation, arthritis or osteomyelitis. MRI of the knee shows mild tear of medial meniscus, ligament sprain, osteoarthritis, small joint effusion.  No obvious evidence of osteomyelitis or fracture --Orthopedics suspecting acute gout flare. --Left knee aspiration shows elevated WBC but no organisms seen on Gram stain, follow cultures.   --Prior attending discussed with Dr. Gale Journey from ID, agreed consistent with inflammatory arthritis.  Low suspicion for infection.   --Start Prednisone 40 mg daily - continue until flare resolves, then taper over 7-10 days --Continue allopurinol  Hypokalemia - Replaced and resolved -- Monitor BMP  Paroxysmal atrial fibrillation - HR controlled - Coreg, Eliquis  Tobacco abuse - As needed nicotine patch ordered  CAD (coronary artery disease), native coronary artery Ischemic cardiomyopathy - Coreg, Aldactone, statin  Hyperlipidemia - Atorvastatin 80 mg daily resumed  Essential hypertension - Carvedilol 3.125 mg p.o. twice daily, spironolactone 25 mg p.o. twice daily resumed IV as needed    DVT prophylaxis: Eliquis Code Status: Full Family Communication:   Status is: Inpatient Ongoing evaluation for low-grade fever, remains of IV diuretics.   Needs further improvement in joint pains from gout flare. PT/OT rec SNF  Subjective: Pt continues to have severe disabling joint pain involving several joints.  He reports doing okay at home until his hands also became painful, had a lot of trouble using his walker.  Breathing improved but still feels like retaining fluid.     Examination:  General exam: awake,  alert, no acute distress HEENT: moist mucus membranes, hearing grossly normal   Respiratory system: CTAB diminished bases, no wheezes, rales or rhonchi, normal respiratory effort. Cardiovascular system: normal S1/S2, RRR, trace lower extremity edema.   Gastrointestinal system: soft, NT, ND, no HSM felt, +bowel sounds. Central nervous system: A&O x3. no gross focal neurologic deficits, normal speech Extremities: exquisitely tender joints on palpation (left knee, ankle, foot, left elbow), right forearm and left elbow tophi noted, erythema of multiple joints which are warm to touch Skin: dry, intact, normal temperature, erythematous joints as above Psychiatry: normal mood, congruent affect, judgement and insight appear normal   Objective: Vitals:   07/20/22 0039 07/20/22 0441 07/20/22 0807 07/20/22 1259  BP: 94/61 105/68 128/80 104/68  Pulse: 79 89 88 87  Resp: 20 20 16 18   Temp: 99.3 F (37.4 C) 98.4 F (36.9 C) 98.4 F (36.9 C) 98.6 F (37 C)  TempSrc: Oral Oral Oral Oral  SpO2: 94% 93% 94% 97%  Weight:      Height:        Intake/Output Summary (Last 24 hours) at 07/20/2022 1306 Last data filed at 07/20/2022 1013 Gross per 24 hour  Intake 480 ml  Output 2300 ml  Net -1820 ml   Filed Weights   07/17/22 1602  Weight: 83.9 kg     Data Reviewed:   CBC: Recent Labs  Lab 07/17/22 0936 07/18/22 0449 07/19/22 0522 07/20/22 0400  WBC 18.2* 13.7* 13.3* 11.2*  HGB 12.6* 10.8* 10.4* 10.1*  HCT 41.1 34.9* 34.4* 32.6*  MCV 94.7 93.8 95.0 93.1  PLT 270 223 279 250   Basic Metabolic Panel: Recent Labs  Lab 07/17/22 0936 07/17/22 1525 07/18/22 0449 07/19/22 0522 07/20/22 0400  NA 136  --  137 134* 135  K 3.3*  --  3.0* 4.0 4.1  CL 96*  --  98 99 100  CO2 25  --  29 26 25   GLUCOSE 97  --  100* 92 94  BUN 13  --  15 19 20   CREATININE 1.30*  --  1.24 1.30* 1.27*  CALCIUM 9.0  --  8.2* 8.4* 8.3*  MG  --  2.2  --  2.2 2.2   GFR: Estimated Creatinine Clearance: 64 mL/min (A) (by C-G formula based on SCr of 1.27 mg/dL (H)). Liver Function  Tests: Recent Labs  Lab 07/17/22 1202  AST 19  ALT 8  ALKPHOS 105  BILITOT 1.8*  PROT 8.0  ALBUMIN 3.3*   No results for input(s): "LIPASE", "AMYLASE" in the last 168 hours. No results for input(s): "AMMONIA" in the last 168 hours. Coagulation Profile: No results for input(s): "INR", "PROTIME" in the last 168 hours. Cardiac Enzymes: No results for input(s): "CKTOTAL", "CKMB", "CKMBINDEX", "TROPONINI" in the last 168 hours. BNP (last 3 results) No results for input(s): "PROBNP" in the last 8760 hours. HbA1C: No results for input(s): "HGBA1C" in the last 72 hours. CBG: Recent Labs  Lab 07/17/22 1232  GLUCAP 102*   Lipid Profile: No results for input(s): "CHOL", "HDL", "LDLCALC", "TRIG", "CHOLHDL", "LDLDIRECT" in the last 72 hours. Thyroid Function Tests: No results for input(s): "TSH", "T4TOTAL", "FREET4", "T3FREE", "THYROIDAB" in the last 72 hours. Anemia Panel: No results for input(s): "VITAMINB12", "FOLATE", "FERRITIN", "TIBC", "IRON", "RETICCTPCT" in the last 72 hours. Sepsis Labs: Recent Labs  Lab 07/17/22 1327 07/17/22 1525  LATICACIDVEN 1.6 2.2*    Recent Results (from the past 240 hour(s))  Blood culture (routine x 2)     Status: None (  Preliminary result)   Collection Time: 07/17/22  1:27 PM   Specimen: BLOOD  Result Value Ref Range Status   Specimen Description BLOOD  RIGHT ARM  Final   Special Requests   Final    BOTTLES DRAWN AEROBIC AND ANAEROBIC Blood Culture results may not be optimal due to an excessive volume of blood received in culture bottles   Culture   Final    NO GROWTH 3 DAYS Performed at [facility], [address] [place], [place] [address]    Report Status PENDING  Incomplete  Blood culture (routine x 2)     Status: None (Preliminary result)   Collection Time: 07/17/22  1:27 PM   Specimen: BLOOD  Result Value Ref Range Status   Specimen Description BLOOD  RIGHT HAND  Final   Special Requests   Final    BOTTLES DRAWN  AEROBIC AND ANAEROBIC Blood Culture results may not be optimal due to an inadequate volume of blood received in culture bottles   Culture   Final    NO GROWTH 3 DAYS Performed at [facility], [address], [place], [place] [address]    Report Status PENDING  Incomplete  Body fluid culture w Gram Stain     Status: None (Preliminary result)   Collection Time: 07/17/22  3:25 PM   Specimen: Synovium; Body Fluid  Result Value Ref Range Status   Specimen Description   Final    SYNOVIAL Performed at [facility], [address] [place], [place] [address]    Special Requests   Final    [facility] KNEE Performed at [facility], [address] [place], [place] [address]    Gram Stain   Final    MODERATE WBC PRESENT, PREDOMINANTLY PMN NO ORGANISMS SEEN    Culture   Final    NO GROWTH 3 DAYS Performed at [facility], [address] [place], [place] [address]    Report Status PENDING  Incomplete         Radiology Studies: No results found.      Scheduled Meds:  allopurinol  100 mg Oral Daily   apixaban  5 mg Oral BID   atorvastatin  80 mg Oral QPM   carvedilol  3.125 mg Oral BID WC   furosemide  20 mg Intravenous BID   pantoprazole  40 mg Oral Daily   [START ON 07/21/2022] predniSONE  40 mg Oral Q breakfast   spironolactone  25 mg Oral BID   Continuous Infusions:   LOS: 3 days   Time spent= 40 mins    Pennie Banter, DO [facility]  If 7PM-7AM, please contact night-coverage  07/20/2022, 1:06 PM

## 2022-07-21 ENCOUNTER — Encounter: Payer: Self-pay | Admitting: *Deleted

## 2022-07-21 DIAGNOSIS — I5043 Acute on chronic combined systolic (congestive) and diastolic (congestive) heart failure: Secondary | ICD-10-CM | POA: Diagnosis not present

## 2022-07-21 LAB — CBC
HCT: 32.9 % — ABNORMAL LOW (ref 39.0–52.0)
Hemoglobin: 10.3 g/dL — ABNORMAL LOW (ref 13.0–17.0)
MCH: 29.2 pg (ref 26.0–34.0)
MCHC: 31.3 g/dL (ref 30.0–36.0)
MCV: 93.2 fL (ref 80.0–100.0)
Platelets: 351 10*3/uL (ref 150–400)
RBC: 3.53 MIL/uL — ABNORMAL LOW (ref 4.22–5.81)
RDW: 18.6 % — ABNORMAL HIGH (ref 11.5–15.5)
WBC: 11.9 10*3/uL — ABNORMAL HIGH (ref 4.0–10.5)
nRBC: 0 % (ref 0.0–0.2)

## 2022-07-21 LAB — BASIC METABOLIC PANEL
Anion gap: 11 (ref 5–15)
BUN: 21 mg/dL — ABNORMAL HIGH (ref 6–20)
CO2: 24 mmol/L (ref 22–32)
Calcium: 8.3 mg/dL — ABNORMAL LOW (ref 8.9–10.3)
Chloride: 100 mmol/L (ref 98–111)
Creatinine, Ser: 1.32 mg/dL — ABNORMAL HIGH (ref 0.61–1.24)
GFR, Estimated: >60 mL/min (ref >60)
Glucose, Bld: 107 mg/dL — ABNORMAL HIGH (ref 70–99)
Potassium: 4.2 mmol/L (ref 3.5–5.1)
Sodium: 135 mmol/L (ref 135–145)

## 2022-07-21 LAB — BODY FLUID CULTURE W GRAM STAIN: Culture: NO GROWTH

## 2022-07-21 LAB — MAGNESIUM: Magnesium: 2.3 mg/dL (ref 1.7–2.4)

## 2022-07-21 MED ORDER — SENNOSIDES-DOCUSATE SODIUM 8.6-50 MG PO TABS
1.0000 | ORAL_TABLET | Freq: Two times a day (BID) | ORAL | Status: DC
  Administered 2022-07-21 – 2022-07-23 (×5): 1 via ORAL
  Filled 2022-07-21 (×8): qty 1

## 2022-07-21 MED ORDER — POLYETHYLENE GLYCOL 3350 17 G PO PACK
17.0000 g | PACK | Freq: Every day | ORAL | Status: DC
  Administered 2022-07-21 – 2022-07-22 (×2): 17 g via ORAL
  Filled 2022-07-21 (×4): qty 1

## 2022-07-21 MED ORDER — OXYCODONE-ACETAMINOPHEN 5-325 MG PO TABS
1.0000 | ORAL_TABLET | Freq: Four times a day (QID) | ORAL | Status: DC | PRN
  Administered 2022-07-21 – 2022-07-26 (×13): 1 via ORAL
  Filled 2022-07-21 (×15): qty 1

## 2022-07-21 MED ORDER — ONDANSETRON 8 MG PO TBDP: 8.0000 mg | ORAL_TABLET | Freq: Three times a day (TID) | ORAL | Status: DC | PRN

## 2022-07-21 MED ORDER — FUROSEMIDE 10 MG/ML IJ SOLN
20.0000 mg | Freq: Two times a day (BID) | INTRAMUSCULAR | Status: DC
  Filled 2022-07-21: qty 2

## 2022-07-21 MED ORDER — FUROSEMIDE 40 MG PO TABS
40.0000 mg | ORAL_TABLET | Freq: Two times a day (BID) | ORAL | Status: DC
  Administered 2022-07-21 – 2022-07-26 (×9): 40 mg via ORAL
  Filled 2022-07-21 (×9): qty 1

## 2022-07-21 NOTE — Progress Notes (Signed)
Physical Therapy Treatment Patient Details Name: Cameron [name] MRN: [id number] DOB: 11/24/1960 Today's Date: 07/21/2022   History of Present Illness 62 y/o male presented to ED on 07/17/22 for bilateral leg swelling and difficulty ambulating. S/p L knee aspiration on 1/11. PMH: CAD, Afib, HTN    PT Comments    Patient continues to be limited by pain in B UE and LE with any attempts at weightbearing. Required extensive time, HOB elevated, and use of bed rails to complete bed mobility with min guard. Able to stand from EOB with modA and RW, constant cueing for hand placement with intermittent follow through. Ablet o take 1 step forward and sidesteps towards [facility] with min guard and RW with cues for utilizing BUE to offweight BLE for advancement but complaining of increased pain with weightbearing. Patient declined further attempts at mobility. Left seated EOB and encouraged patient to perform LAQ and ankle pumps for improving ROM and overall joint mobility for pain management. Continue to recommend SNF for ongoing Physical Therapy.       Recommendations for follow up therapy are one component of a multi-disciplinary discharge planning process, led by the attending physician.  Recommendations may be updated based on patient status, additional functional criteria and insurance authorization.  Follow Up Recommendations  Skilled nursing-short term rehab (<3 hours/day) Can patient physically be transported by private vehicle: No   Assistance Recommended at Discharge Frequent or constant Supervision/Assistance  Patient can return home with the following A lot of help with walking and/or transfers;A little help with bathing/dressing/bathroom;Assistance with cooking/housework;Assist for transportation;Help with stairs or ramp for entrance   Equipment Recommendations  BSC/3in1    Recommendations for Other Services       Precautions / Restrictions Precautions Precautions: Fall Restrictions Weight  Bearing Restrictions: No     Mobility  Bed Mobility Overal bed mobility: Needs Assistance Bed Mobility: Supine to Sit     Supine to sit: Min guard, HOB elevated     General bed mobility comments: extensive time to complete bed mobility with HOB max elevated and use of bed rails. Utilized UEs to assist L LE off bed. Min guard for safety    Transfers Overall transfer level: Needs assistance Equipment used: Rolling Darely Becknell (2 wheels) Transfers: Sit to/from Stand Sit to Stand: Mod assist           General transfer comment: assist to boost up into standing from low bed surface. Constant cueing for hand placement    Ambulation/Gait Ambulation/Gait assistance: Min guard Gait Distance (Feet): 4 Feet Assistive device: Rolling Antwann Preziosi (2 wheels) Gait Pattern/deviations: Step-to pattern Gait velocity: decreased     General Gait Details: min guard for safety. Able to take one step forward then sidesteps towards HOB. Cues for utilizing UEs to offweight LEs for easier advancement. Intermittent follow through. Patient declined further attempts at mobility due to increased pain   Stairs             Wheelchair Mobility    Modified Rankin (Stroke Patients Only)       Balance Overall balance assessment: Needs assistance Sitting-balance support: Feet supported Sitting balance-Leahy Scale: Good     Standing balance support: Reliant on assistive device for balance, During functional activity, Bilateral upper extremity supported Standing balance-Leahy Scale: Poor                              Cognition Arousal/Alertness: Awake/alert Behavior During Therapy: WFL for tasks assessed/performed  Overall Cognitive Status: Within Functional Limits for tasks assessed                                 General Comments: seems to demonstrate poor insight into current deficits as patient asks this therapist about getting on treadmill would help mobility but  currently unable to take more than 2-3 steps        Exercises      General Comments        Pertinent Vitals/Pain Pain Assessment Pain Assessment: Faces Faces Pain Scale: Hurts even more Pain Location: R hand, L knee and ankle Pain Descriptors / Indicators: Guarding, Grimacing Pain Intervention(s): Monitored during session, Repositioned, Limited activity within patient's tolerance    Home Living                          Prior Function            PT Goals (current goals can now be found in the care plan section) Acute Rehab PT Goals Patient Stated Goal: to reduce pain PT Goal Formulation: With patient Time For Goal Achievement: 08/01/22 Potential to Achieve Goals: Good Progress towards PT goals: Progressing toward goals    Frequency    Min 2X/week      PT Plan Current plan remains appropriate    Co-evaluation              AM-PAC PT "6 Clicks" Mobility   Outcome Measure  Help needed turning from your back to your side while in a flat bed without using bedrails?: A Lot Help needed moving from lying on your back to sitting on the side of a flat bed without using bedrails?: A Lot Help needed moving to and from a bed to a chair (including a wheelchair)?: A Lot Help needed standing up from a chair using your arms (e.g., wheelchair or bedside chair)?: A Lot Help needed to walk in hospital room?: Total Help needed climbing 3-5 steps with a railing? : Total 6 Click Score: 10    End of Session   Activity Tolerance: Patient limited by pain Patient left: in bed;with bed alarm set;with call bell/phone within reach (seated EOB) Nurse Communication: Mobility status PT Visit Diagnosis: Unsteadiness on feet (R26.81);Muscle weakness (generalized) (M62.81);Difficulty in walking, not elsewhere classified (R26.2)     Time: 6222-9798 PT Time Calculation (min) (ACUTE ONLY): 25 min  Charges:  $Therapeutic Activity: 23-37 mins                     Shironda Kain A.  [place]ford Rile PT, DPT [facility]    Delores Thelen A Nahomy [place] 07/21/2022, 2:18 PM

## 2022-07-21 NOTE — Progress Notes (Addendum)
PROGRESS NOTE    Cameron [name]  KGM:[id number] DOB: March 08, 1961 DOA: 07/17/2022 PCP: [facility]   Brief Narrative:  62 year old male with coronary artery disease, atrial fibrillation, hyperlipidemia, CAD, GERD, who presents emergency department for chief concerns of bilateral lower extremity swelling and multiple joint pain.  Admitted with concerns of CHF and possible septic arthritis.  Outpatient PCP hide patient has gout therefore received a course of colchicine and thereafter prescribed allopurinol which he has not started.  Arthrocentesis was performed by orthopedic which showed elevated WBC and was suspected to be secondary to acute gout.  No organism were seen on micro.  In the meantime getting IV diuretics for CHF exacerbation.   Assessment & Plan:  Principal Problem:   Acute on chronic combined systolic and diastolic CHF (congestive heart failure) (HCC) Active Problems:   Essential hypertension   Hyperlipidemia   CAD (coronary artery disease), native coronary artery   Tobacco abuse   Acute gouty arthritis   Leukocytosis     Assessment and Plan: * Acute exacerbation of CHF (congestive heart failure) ([place]), with reduced ejection fraction, 25% Elevated BNP -Patient with elevated BNP, started on IV diuretics.   Net IO Since Admission: -3,690 mL [07/21/22 1056] UO yesterday 1650 cc --PO Lasix 40 mg BID today (pt lost IV and requests not to have another for now) (home dose 20 mg BID) Renal function stable, remains with dependent edema. --Monitor fluid retention with steroid use --Echocardiogram 1/12 -- EF 40-45% (improved from 20-25% in 2021), grade II diastolic dysfunction    --Chest x-ray showing possible small pericardial effusion.   --Soft BP, therefore okay to hold Coreg and Aldactone if needed  Leukocytosis - Suspect due to inflammation from gout flare, now on steroids  Acute Gout Flare with Polyarthralgia Pt has multiple joints with exquisite  tenderness, swelling, erythema and warmth.   Tophi noted on left elbow, right forearm.   Pt reports no improvement with colchicine or multiple short courses of prednisone recently. X-ray of his left ankle right wrist and left knee unremarkable.   MRI of the left ankle - concerns for contusion/injury, tendinosis with peritendinitis, osteochondral injury of the talar dome, ankle sprain and generalized swelling.  There is no obvious evidence of fracture, dislocation, arthritis or osteomyelitis. MRI of the knee shows mild tear of medial meniscus, ligament sprain, osteoarthritis, small joint effusion.  No obvious evidence of osteomyelitis or fracture --Orthopedics suspecting acute gout flare. --Left knee aspiration shows elevated WBC but no organisms seen on Gram stain, follow cultures.   --Prior attending discussed with Dr. Gale Journey from ID, agreed consistent with inflammatory arthritis.  Low suspicion for infection.   --Started Prednisone 40 mg daily - continue until flare resolves, then taper over 7-10 days --Continue allopurinol --May need to add NSAID also  Hypokalemia - Replaced and resolved -- Monitor BMP  Paroxysmal atrial fibrillation - HR controlled - Coreg, Eliquis  Tobacco abuse - As needed nicotine patch ordered  CAD (coronary artery disease), native coronary artery Ischemic cardiomyopathy - Coreg, Aldactone, statin  Hyperlipidemia - Atorvastatin 80 mg daily resumed  Essential hypertension - Carvedilol 3.125 mg p.o. twice daily, spironolactone 25 mg p.o. twice daily resumed IV as needed    DVT prophylaxis: Eliquis Code Status: Full Family Communication:   Status is: Inpatient Remains on IV diuretics.   Needs further improvement in volume status and severe gout flare. PT/OT rec SNF   Subjective: Pt started on prednisone yesterday, reports not noticing much improvement in joint  pains, and requests percocet be re-ordered (it had expired). States percocet takes edge of  his pain just enough so he can tolerate it.  Still not able to tolerate use of walker due to upper extremity joint pains.  Still feels like he has fluid retention.  No other acute complaints.    Examination:  General exam: awake, alert, no acute distress HEENT: moist mucus membranes, hearing grossly normal  Respiratory system: on room air, normal respiratory effort. Cardiovascular system: normal S1/S2, RRR, trace lower extremity edema.   Central nervous system: A&O x3. no gross focal neurologic deficits, normal speech Extremities: multiple tender joints on palpation (left knee, ankle, foot, left elbow), right forearm and left elbow tophi noted, erythema of multiple joints but less warm to touch today Skin: dry, intact, normal temperature, erythematous joints as above Psychiatry: normal mood, congruent affect, judgement and insight appear normal   Objective: Vitals:   07/20/22 1935 07/20/22 2349 07/21/22 0408 07/21/22 0857  BP: 102/63 104/70 106/69 118/76  Pulse: 84 83 89 79  Resp: 16 16 14 18   Temp: 98 F (36.7 C) 98.6 F (37 C) 99 F (37.2 C) 98.2 F (36.8 C)  TempSrc:   Oral Oral  SpO2: 94% 95% 95% 96%  Weight:      Height:        Intake/Output Summary (Last 24 hours) at 07/21/2022 1050 Last data filed at 07/21/2022 0942 Gross per 24 hour  Intake 240 ml  Output 1200 ml  Net -960 ml   Filed Weights   07/17/22 1602  Weight: 83.9 kg     Data Reviewed:   CBC: Recent Labs  Lab 07/17/22 0936 07/18/22 0449 07/19/22 0522 07/20/22 0400 07/21/22 0449  WBC 18.2* 13.7* 13.3* 11.2* 11.9*  HGB 12.6* 10.8* 10.4* 10.1* 10.3*  HCT 41.1 34.9* 34.4* 32.6* 32.9*  MCV 94.7 93.8 95.0 93.1 93.2  PLT 270 223 279 289 099   Basic Metabolic Panel: Recent Labs  Lab 07/17/22 0936 07/17/22 1525 07/18/22 0449 07/19/22 0522 07/20/22 0400 07/21/22 0449  NA 136  --  137 134* 135 135  K 3.3*  --  3.0* 4.0 4.1 4.2  CL 96*  --  98 99 100 100  CO2 25  --  29 26 25 24   GLUCOSE 97   --  100* 92 94 107*  BUN 13  --  15 19 20  21*  CREATININE 1.30*  --  1.24 1.30* 1.27* 1.32*  CALCIUM 9.0  --  8.2* 8.4* 8.3* 8.3*  MG  --  2.2  --  2.2 2.2 2.3   GFR: Estimated Creatinine Clearance: 61.6 mL/min (A) (by C-G formula based on SCr of 1.32 mg/dL (H)). Liver Function Tests: Recent Labs  Lab 07/17/22 1202  AST 19  ALT 8  ALKPHOS 105  BILITOT 1.8*  PROT 8.0  ALBUMIN 3.3*   No results for input(s): "LIPASE", "AMYLASE" in the last 168 hours. No results for input(s): "AMMONIA" in the last 168 hours. Coagulation Profile: No results for input(s): "INR", "PROTIME" in the last 168 hours. Cardiac Enzymes: No results for input(s): "CKTOTAL", "CKMB", "CKMBINDEX", "TROPONINI" in the last 168 hours. BNP (last 3 results) No results for input(s): "PROBNP" in the last 8760 hours. HbA1C: No results for input(s): "HGBA1C" in the last 72 hours. CBG: Recent Labs  Lab 07/17/22 1232  GLUCAP 102*   Lipid Profile: No results for input(s): "CHOL", "HDL", "LDLCALC", "TRIG", "CHOLHDL", "LDLDIRECT" in the last 72 hours. Thyroid Function Tests: No results for input(s): "  TSH", "T4TOTAL", "FREET4", "T3FREE", "THYROIDAB" in the last 72 hours. Anemia Panel: No results for input(s): "VITAMINB12", "FOLATE", "FERRITIN", "TIBC", "IRON", "RETICCTPCT" in the last 72 hours. Sepsis Labs: Recent Labs  Lab 07/17/22 1327 07/17/22 1525  LATICACIDVEN 1.6 2.2*    Recent Results (from the past 240 hour(s))  Blood culture (routine x 2)     Status: None (Preliminary result)   Collection Time: 07/17/22  1:27 PM   Specimen: BLOOD  Result Value Ref Range Status   Specimen Description BLOOD  RIGHT ARM  Final   Special Requests   Final    BOTTLES DRAWN AEROBIC AND ANAEROBIC Blood Culture results may not be optimal due to an excessive volume of blood received in culture bottles   Culture   Final    NO GROWTH 4 DAYS Performed at [facility], [address], [place], [place] [address]     Report Status PENDING  Incomplete  Blood culture (routine x 2)     Status: None (Preliminary result)   Collection Time: 07/17/22  1:27 PM   Specimen: BLOOD  Result Value Ref Range Status   Specimen Description BLOOD  RIGHT HAND  Final   Special Requests   Final    BOTTLES DRAWN AEROBIC AND ANAEROBIC Blood Culture results may not be optimal due to an inadequate volume of blood received in culture bottles   Culture   Final    NO GROWTH 4 DAYS Performed at [facility], [address] [place], [place] [address]    Report Status PENDING  Incomplete  Body fluid culture w Gram Stain     Status: None   Collection Time: 07/17/22  3:25 PM   Specimen: Synovium; Body Fluid  Result Value Ref Range Status   Specimen Description   Final    SYNOVIAL Performed at [facility], [address] [place], [place] [address]    Special Requests   Final    [facility] KNEE Performed at [facility], [address] [place], [place] [address]    Gram Stain   Final    MODERATE WBC PRESENT, PREDOMINANTLY PMN NO ORGANISMS SEEN    Culture   Final    NO GROWTH Performed at [facility], [address] [place], [place] [address]    Report Status 07/21/2022 FINAL  Final         Radiology Studies: No results found.      Scheduled Meds:  allopurinol  100 mg Oral Daily   apixaban  5 mg Oral BID   atorvastatin  80 mg Oral QPM   carvedilol  3.125 mg Oral BID WC   pantoprazole  40 mg Oral Daily   polyethylene glycol  17 g Oral Daily   predniSONE  40 mg Oral Q breakfast   senna-docusate  1 tablet Oral BID   spironolactone  25 mg Oral BID   Continuous Infusions:   LOS: 4 days   Time spent 35 mins    Pennie Banter, DO [facility]  If 7PM-7AM, please contact night-coverage  07/21/2022, 10:50 AM

## 2022-07-21 NOTE — Telephone Encounter (Signed)
This encounter was created in error - please disregard.

## 2022-07-22 ENCOUNTER — Encounter: Payer: Commercial Managed Care - PPO | Admitting: Family

## 2022-07-22 DIAGNOSIS — I5043 Acute on chronic combined systolic (congestive) and diastolic (congestive) heart failure: Secondary | ICD-10-CM | POA: Diagnosis not present

## 2022-07-22 DIAGNOSIS — M109 Gout, unspecified: Secondary | ICD-10-CM

## 2022-07-22 DIAGNOSIS — D72829 Elevated white blood cell count, unspecified: Secondary | ICD-10-CM

## 2022-07-22 LAB — BASIC METABOLIC PANEL
Anion gap: 8 (ref 5–15)
BUN: 25 mg/dL — ABNORMAL HIGH (ref 6–20)
CO2: 27 mmol/L (ref 22–32)
Calcium: 8.9 mg/dL (ref 8.9–10.3)
Chloride: 101 mmol/L (ref 98–111)
Creatinine, Ser: 1.19 mg/dL (ref 0.61–1.24)
GFR, Estimated: >60 mL/min (ref >60)
Glucose, Bld: 107 mg/dL — ABNORMAL HIGH (ref 70–99)
Potassium: 4.2 mmol/L (ref 3.5–5.1)
Sodium: 136 mmol/L (ref 135–145)

## 2022-07-22 LAB — CULTURE, BLOOD (ROUTINE X 2)
Culture: NO GROWTH
Culture: NO GROWTH

## 2022-07-22 LAB — MAGNESIUM: Magnesium: 2.4 mg/dL (ref 1.7–2.4)

## 2022-07-22 LAB — CBC
HCT: 34.5 % — ABNORMAL LOW (ref 39.0–52.0)
Hemoglobin: 10.7 g/dL — ABNORMAL LOW (ref 13.0–17.0)
MCH: 28.5 pg (ref 26.0–34.0)
MCHC: 31 g/dL (ref 30.0–36.0)
MCV: 91.8 fL (ref 80.0–100.0)
Platelets: 411 10*3/uL — ABNORMAL HIGH (ref 150–400)
RBC: 3.76 MIL/uL — ABNORMAL LOW (ref 4.22–5.81)
RDW: 18.6 % — ABNORMAL HIGH (ref 11.5–15.5)
WBC: 13.7 10*3/uL — ABNORMAL HIGH (ref 4.0–10.5)
nRBC: 0 % (ref 0.0–0.2)

## 2022-07-22 MED ORDER — LIDOCAINE 4 % EX CREA: TOPICAL_CREAM | Freq: Four times a day (QID) | CUTANEOUS | Status: DC | PRN

## 2022-07-22 MED ORDER — DICLOFENAC SODIUM 1 % EX GEL
4.0000 g | Freq: Four times a day (QID) | CUTANEOUS | Status: DC
  Administered 2022-07-22 – 2022-07-26 (×13): 4 g via TOPICAL
  Filled 2022-07-22: qty 100

## 2022-07-22 NOTE — Progress Notes (Signed)
Occupational Therapy Treatment Patient Details Name: Cameron [name] MRN: [id number] DOB: 1960/08/25 Today's Date: 07/22/2022   History of present illness 62 y/o male presented to ED on 07/17/22 for bilateral leg swelling and difficulty ambulating. S/p L knee aspiration on 1/11. PMH: CAD, Afib, HTN   OT comments  Upon entering the room, pt seated on EOB with RN present in the room and discussing that it has been 5 days since last BM. OT encouraged pt to attempt toileting on BSC. Pt stands with max lifting assistance from standard height bed with use of RW and heavy facilitation of anterior weight shift. Pt taking side steps with RW to [facility] with min A. Pt able to have large BM. He is able to assist with hygiene while seated but needing further assistance for thoroughness. Pt stands from [facility] with mod A and ambulates 6' to recliner chair with min A and use of RW. Pt remains seated in recliner chair with call bell and all needed items within reach.    Recommendations for follow up therapy are one component of a multi-disciplinary discharge planning process, led by the attending physician.  Recommendations may be updated based on patient status, additional functional criteria and insurance authorization.    Follow Up Recommendations  Skilled nursing-short term rehab (<3 hours/day)        Patient can return home with the following  A lot of help with walking and/or transfers;A lot of help with bathing/dressing/bathroom;Assistance with cooking/housework;Help with stairs or ramp for entrance;Assist for transportation   Equipment Recommendations  BSC/3in1       Precautions / Restrictions Precautions Precautions: Fall       Mobility Bed Mobility               General bed mobility comments: seated on EOB with RN    Transfers Overall transfer level: Needs assistance Equipment used: Rolling walker (2 wheels) Transfers: Sit to/from Stand, Bed to chair/wheelchair/BSC Sit to Stand: Mod  assist     Step pivot transfers: Mod assist           Balance Overall balance assessment: Needs assistance Sitting-balance support: Feet supported Sitting balance-Leahy Scale: Good     Standing balance support: Reliant on assistive device for balance, During functional activity, Bilateral upper extremity supported Standing balance-Leahy Scale: Poor                             ADL either performed or assessed with clinical judgement   ADL Overall ADL's : Needs assistance/impaired                         Toilet Transfer: Moderate assistance;Rolling walker (2 wheels);Maximal assistance Toilet Transfer Details (indicate cue type and reason): lifting assistance Toileting- Clothing Manipulation and Hygiene: Moderate assistance;Sit to/from stand               Vision Patient Visual Report: No change from baseline            Cognition Arousal/Alertness: Awake/alert Behavior During Therapy: WFL for tasks assessed/performed Overall Cognitive Status: Within Functional Limits for tasks assessed                                                     Pertinent Vitals/ Pain  Pain Assessment Pain Assessment: Faces Faces Pain Scale: Hurts little more Pain Location: L knee and ankle Pain Descriptors / Indicators: Aching, Discomfort Pain Intervention(s): Monitored during session, Premedicated before session, Repositioned         Frequency  Min 2X/week        Progress Toward Goals  OT Goals(current goals can now be found in the care plan section)  Progress towards OT goals: Progressing toward goals  Acute Rehab OT Goals Patient Stated Goal: to get stronger OT Goal Formulation: With patient Time For Goal Achievement: 08/01/22 Potential to Achieve Goals: [place] Discharge plan remains appropriate;Frequency remains appropriate       AM-PAC OT "6 Clicks" Daily Activity     Outcome Measure   Help from another person  eating meals?: None Help from another person taking care of personal grooming?: A Little Help from another person toileting, which includes using toliet, bedpan, or urinal?: A Lot Help from another person bathing (including washing, rinsing, drying)?: A Lot Help from another person to put on and taking off regular upper body clothing?: A Little Help from another person to put on and taking off regular lower body clothing?: A Lot 6 Click Score: 16    End of Session Equipment Utilized During Treatment: Rolling walker (2 wheels);Other (comment) (BSC)  OT Visit Diagnosis: Unsteadiness on feet (R26.81);Muscle weakness (generalized) (M62.81);Pain Pain - Right/Left: Right Pain - part of body: Ankle and joints of foot;Knee   Activity Tolerance Patient tolerated treatment well   Patient Left with call bell/phone within reach;in chair;with chair alarm set   Nurse Communication Mobility status        Time: 8938-1017 OT Time Calculation (min): 35 min  Charges: OT General Charges $OT Visit: 1 Visit OT Treatments $Self Care/Home Management : 23-37 mins  Darleen Crocker, MS, OTR/L , CBIS ascom [phone]  07/22/22, 12:28 PM

## 2022-07-22 NOTE — Progress Notes (Signed)
PROGRESS NOTE    Cameron [name]  PIR:[id number] DOB: Nov 12, 1960 DOA: 07/17/2022 PCP: [facility]   Brief Narrative:  62 year old male with coronary artery disease, atrial fibrillation, hyperlipidemia, CAD, GERD, who presents emergency department for chief concerns of bilateral lower extremity swelling and multiple joint pain.  Admitted with concerns of CHF and possible septic arthritis.  Outpatient PCP hide patient has gout therefore received a course of colchicine and thereafter prescribed allopurinol which he has not started.  Arthrocentesis was performed by orthopedic which showed elevated WBC and was suspected to be secondary to acute gout.  No organism were seen on micro.  In the meantime getting IV diuretics for CHF exacerbation.   Assessment & Plan:  Principal Problem:   Acute on chronic combined systolic and diastolic CHF (congestive heart failure) (HCC) Active Problems:   Essential hypertension   Hyperlipidemia   CAD (coronary artery disease), native coronary artery   Tobacco abuse   Acute gouty arthritis   Leukocytosis     Assessment and Plan: * Acute exacerbation of CHF (congestive heart failure) ([place]), with reduced ejection fraction, 25% Elevated BNP -Patient with elevated BNP, started on IV diuretics.   Net IO Since Admission: -4,770 mL [07/22/22 1355] UO yesterday 1650 cc --PO Lasix 40 mg BID today (pt lost IV and requests not to have another for now) (home dose 20 mg BID) Renal function stable, remains with dependent edema. --Monitor fluid retention with steroid use --Echocardiogram 1/12 -- EF 40-45% (improved from 20-25% in 2021), grade II diastolic dysfunction    --Chest x-ray showing possible small pericardial effusion.   --Soft BP at times, therefore okay to hold Coreg and Aldactone if needed  Acute Gout Flare with Polyarthralgia Pt has multiple joints with exquisite tenderness, swelling, erythema and warmth.   Tophi noted on left elbow,  right forearm.   Pt reports no improvement with colchicine or multiple short courses of prednisone recently. X-ray of his left ankle right wrist and left knee unremarkable.   MRI of the left ankle - concerns for contusion/injury, tendinosis with peritendinitis, osteochondral injury of the talar dome, ankle sprain and generalized swelling.  There is no obvious evidence of fracture, dislocation, arthritis or osteomyelitis. MRI of the knee shows mild tear of medial meniscus, ligament sprain, osteoarthritis, small joint effusion.  No obvious evidence of osteomyelitis or fracture --Orthopedics suspecting acute gout flare. --Left knee aspiration shows elevated WBC but no organisms seen on Gram stain, follow cultures.   --Prior attending discussed with Dr. Gale Journey from ID, agreed consistent with inflammatory arthritis.  Low suspicion for infection.   --Prednisone 40 mg daily - continue until flare resolves, then taper over 7-10 days --Add topical Voltaren and lidocaine - use whichever helps more w relief --Continue allopurinol --May need to add NSAID also but hoping to avoid given already issue with volume overload, diuresing, prefer not to tax kidneys with NSAID. --Per Dr. Cleda Mccreedy, podiatry - pt already has rheumatology follow up scheduled. Not much else to do from podiatry standpoint. Pt has scheduled follow up with him 1/18  Leukocytosis - Suspect due to inflammation from gout flare, now on steroids  Hypokalemia - Replaced and resolved -- Monitor BMP  Paroxysmal atrial fibrillation - HR controlled - Coreg, Eliquis  Tobacco abuse - As needed nicotine patch ordered  CAD (coronary artery disease), native coronary artery Ischemic cardiomyopathy - Coreg, Aldactone, statin  Hyperlipidemia - Atorvastatin 80 mg daily resumed  Essential hypertension - Carvedilol 3.125 mg p.o. twice daily, spironolactone  25 mg p.o. twice daily resumed IV as needed    DVT prophylaxis: Eliquis Code Status:  Full Family Communication:   Status is: Inpatient Remains on IV diuretics.   Needs further improvement in volume status and severe gout flare. PT/OT rec SNF   Subjective: Pt up in recliner when seen this AM. Reports both knees extremely painful today.  Ongoing pain in elbows hands and other joints as well, but knees today are much worse.  He denies other acute complaints.     Examination:  General exam: awake, alert, no acute distress HEENT: moist mucus membranes, hearing grossly normal  Respiratory system: on room air, normal respiratory effort. Cardiovascular system: normal S1/S2, RRR, lower extremity edema appears resolved Central nervous system: A&O x3. no gross focal neurologic deficits, normal speech Extremities: multiple tender joints on palpation (left knee, ankle, foot, left elbow), right forearm and left elbow tophi noted, erythema of multiple joints but less warm to touch today Skin: dry, intact, normal temperature, erythematous joints as above Psychiatry: normal mood, congruent affect, judgement and insight appear normal   Objective: Vitals:   07/22/22 0009 07/22/22 0517 07/22/22 0756 07/22/22 1128  BP: 98/69 123/88 125/82 125/80  Pulse: 72 72 80 83  Resp: 16 16 18    Temp: 98.2 F (36.8 C) 98 F (36.7 C) 97.6 F (36.4 C) 97.7 F (36.5 C)  TempSrc: Oral Oral Oral Oral  SpO2: 96% 98% 96% 97%  Weight:   84.3 kg   Height:        Intake/Output Summary (Last 24 hours) at 07/22/2022 1355 Last data filed at 07/22/2022 0756 Gross per 24 hour  Intake --  Output 705 ml  Net -705 ml   Filed Weights   07/17/22 1602 07/22/22 0756  Weight: 83.9 kg 84.3 kg     Data Reviewed:   CBC: Recent Labs  Lab 07/18/22 0449 07/19/22 0522 07/20/22 0400 07/21/22 0449 07/22/22 0621  WBC 13.7* 13.3* 11.2* 11.9* 13.7*  HGB 10.8* 10.4* 10.1* 10.3* 10.7*  HCT 34.9* 34.4* 32.6* 32.9* 34.5*  MCV 93.8 95.0 93.1 93.2 91.8  PLT 223 279 289 351 638*   Basic Metabolic  Panel: Recent Labs  Lab 07/17/22 1525 07/18/22 0449 07/19/22 0522 07/20/22 0400 07/21/22 0449 07/22/22 0621  NA  --  137 134* 135 135 136  K  --  3.0* 4.0 4.1 4.2 4.2  CL  --  98 99 100 100 101  CO2  --  29 26 25 24 27   GLUCOSE  --  100* 92 94 107* 107*  BUN  --  15 19 20  21* 25*  CREATININE  --  1.24 1.30* 1.27* 1.32* 1.19  CALCIUM  --  8.2* 8.4* 8.3* 8.3* 8.9  MG 2.2  --  2.2 2.2 2.3 2.4   GFR: Estimated Creatinine Clearance: 68.5 mL/min (by C-G formula based on SCr of 1.19 mg/dL). Liver Function Tests: Recent Labs  Lab 07/17/22 1202  AST 19  ALT 8  ALKPHOS 105  BILITOT 1.8*  PROT 8.0  ALBUMIN 3.3*   No results for input(s): "LIPASE", "AMYLASE" in the last 168 hours. No results for input(s): "AMMONIA" in the last 168 hours. Coagulation Profile: No results for input(s): "INR", "PROTIME" in the last 168 hours. Cardiac Enzymes: No results for input(s): "CKTOTAL", "CKMB", "CKMBINDEX", "TROPONINI" in the last 168 hours. BNP (last 3 results) No results for input(s): "PROBNP" in the last 8760 hours. HbA1C: No results for input(s): "HGBA1C" in the last 72 hours. CBG: Recent Labs  Lab 07/17/22 1232  GLUCAP 102*   Lipid Profile: No results for input(s): "CHOL", "HDL", "LDLCALC", "TRIG", "CHOLHDL", "LDLDIRECT" in the last 72 hours. Thyroid Function Tests: No results for input(s): "TSH", "T4TOTAL", "FREET4", "T3FREE", "THYROIDAB" in the last 72 hours. Anemia Panel: No results for input(s): "VITAMINB12", "FOLATE", "FERRITIN", "TIBC", "IRON", "RETICCTPCT" in the last 72 hours. Sepsis Labs: Recent Labs  Lab 07/17/22 1327 07/17/22 1525  LATICACIDVEN 1.6 2.2*    Recent Results (from the past 240 hour(s))  Blood culture (routine x 2)     Status: None   Collection Time: 07/17/22  1:27 PM   Specimen: BLOOD  Result Value Ref Range Status   Specimen Description BLOOD  RIGHT ARM  Final   Special Requests   Final    BOTTLES DRAWN AEROBIC AND ANAEROBIC Blood Culture  results may not be optimal due to an excessive volume of blood received in culture bottles   Culture   Final    NO GROWTH 5 DAYS Performed at [facility], [address] [place], [place] [address]    Report Status 07/22/2022 FINAL  Final  Blood culture (routine x 2)     Status: None   Collection Time: 07/17/22  1:27 PM   Specimen: BLOOD  Result Value Ref Range Status   Specimen Description BLOOD  RIGHT HAND  Final   Special Requests   Final    BOTTLES DRAWN AEROBIC AND ANAEROBIC Blood Culture results may not be optimal due to an inadequate volume of blood received in culture bottles   Culture   Final    NO GROWTH 5 DAYS Performed at [facility], [address] [place], [place] [address]    Report Status 07/22/2022 FINAL  Final  Body fluid culture w Gram Stain     Status: None   Collection Time: 07/17/22  3:25 PM   Specimen: Synovium; Body Fluid  Result Value Ref Range Status   Specimen Description   Final    SYNOVIAL Performed at [facility], [address] [place], [place] [address]    Special Requests   Final    [facility] KNEE Performed at [facility], [address] [place], [place] [address]    Gram Stain   Final    MODERATE WBC PRESENT, PREDOMINANTLY PMN NO ORGANISMS SEEN    Culture   Final    NO GROWTH Performed at [facility], [address] [place], [place] [address]    Report Status 07/21/2022 FINAL  Final         Radiology Studies: No results found.      Scheduled Meds:  allopurinol  100 mg Oral Daily   apixaban  5 mg Oral BID   atorvastatin  80 mg Oral QPM   carvedilol  3.125 mg Oral BID WC   diclofenac Sodium  4 g Topical QID   furosemide  40 mg Oral BID   pantoprazole  40 mg Oral Daily   polyethylene glycol  17 g Oral Daily   predniSONE  40 mg Oral Q breakfast   senna-docusate  1 tablet Oral BID   spironolactone  25 mg Oral BID   Continuous Infusions:   LOS: 5 days   Time spent 35  mins    Pennie Banter, DO [facility]  If 7PM-7AM, please contact night-coverage  07/22/2022, 1:55 PM

## 2022-07-22 NOTE — Progress Notes (Signed)
Physical Therapy Treatment Patient Details Name: Cameron [name] MRN: [id number] DOB: [birth year] Today's Date: 07/22/2022   History of Present Illness 62 y/o male presented to ED on 07/17/22 for bilateral leg swelling and difficulty ambulating. S/p L knee aspiration on 1/11. PMH: CAD, Afib, HTN    PT Comments    Pt reports he is very tired from sitting up in the recliner earlier today as well as having his first BM in 6 days earlier.  Initially only felt like sleeping but did acknowledge that it is good to keep moving and that his pain is not nearly as severe as it has been so with some encouragement he was ultimately willing to try.  He showed slow, labored but good effort using UEs to ease LEs toward EOB (heavy UE and light direct assist getting back into bed) but good overall effort.  He was able to tolerate some moderate time in standing with heavy walker use.  Pt showed improved tolerance with marching in place, side stepping, weight shifting and we were actually able to do a decent bout of effortful but safe ambulation to the door and back with only CGA,plenty of cuing.  Continue with POC.  Recommendations for follow up therapy are one component of a multi-disciplinary discharge planning process, led by the attending physician.  Recommendations may be updated based on patient status, additional functional criteria and insurance authorization.  Follow Up Recommendations  Skilled nursing-short term rehab (<3 hours/day) Can patient physically be transported by private vehicle: No   Assistance Recommended at Discharge Frequent or constant Supervision/Assistance  Patient can return home with the following A little help with walking and/or transfers;A little help with bathing/dressing/bathroom;Assistance with cooking/housework;Direct supervision/assist for medications management;Assist for transportation;Help with stairs or ramp for entrance   Equipment Recommendations  BSC/3in1     Recommendations for Other Services       Precautions / Restrictions Precautions Precautions: Fall     Mobility  Bed Mobility Overal bed mobility: Needs Assistance Bed Mobility: Supine to Sit, Sit to Supine     Supine to sit: Min guard Sit to supine: Min assist   General bed mobility comments: Pt showed good effort with slow transition from supine to sit but needed UEs holding LEs/socks to get to EOB and sitting,  assist needed to get LEs back into bed/unable to lift LEs on his own    Transfers Overall transfer level: Needs assistance Equipment used: Rolling walker (2 wheels) Transfers: Sit to/from Stand, Bed to chair/wheelchair/BSC Sit to Stand: Min assist           General transfer comment: Pt struggled to get LEs underneath him fully, but with forward lean and heavy UE use was able to rise from standard height bed with only min assist.  Pt highly reliant on the walker to unweight but able to maintain standing w/o direct assist    Ambulation/Gait Ambulation/Gait assistance: Min guard Gait Distance (Feet): 30 Feet Assistive device: Rolling walker (2 wheels)         General Gait Details: Pt was able to do some side stepping along EOB, and then after brief rest break actually amblated to the door and back.  He showed hesitancy with WBing through LEs but ultimately did not have any overt Safety/LOBs issues.  Pt did fatigue quickly with the effort.   Stairs             Wheelchair Mobility    Modified Rankin (Stroke Patients Only)  Balance Overall balance assessment: Needs assistance Sitting-balance support: Feet supported Sitting balance-Leahy Scale: Good     Standing balance support: Reliant on assistive device for balance, During functional activity, Bilateral upper extremity supported Standing balance-Leahy Scale: Fair                              Cognition Arousal/Alertness: Awake/alert Behavior During Therapy: WFL for tasks  assessed/performed Overall Cognitive Status: Within Functional Limits for tasks assessed                                          Exercises Other Exercises Other Exercises: marching in place at EOB in walker, able to lift and maintain SLS for 3-5 seconds for multiple reps.  Definite need of UEs but did not hesitate 2/2 pain, good ability to weight shift.    General Comments        Pertinent Vitals/Pain Pain Assessment Pain Assessment: 0-10 Pain Score: 8  Pain Location: L knee and ankle    Home Living                          Prior Function            PT Goals (current goals can now be found in the care plan section) Progress towards PT goals: Progressing toward goals    Frequency    Min 2X/week      PT Plan Current plan remains appropriate    Co-evaluation              AM-PAC PT "6 Clicks" Mobility   Outcome Measure  Help needed turning from your back to your side while in a flat bed without using bedrails?: None Help needed moving from lying on your back to sitting on the side of a flat bed without using bedrails?: A Little Help needed moving to and from a bed to a chair (including a wheelchair)?: A Lot Help needed standing up from a chair using your arms (e.g., wheelchair or bedside chair)?: A Lot Help needed to walk in hospital room?: A Lot Help needed climbing 3-5 steps with a railing? : Total 6 Click Score: 14    End of Session Equipment Utilized During Treatment: Gait belt Activity Tolerance: Patient limited by pain Patient left: with bed alarm set;with call bell/phone within reach   PT Visit Diagnosis: Unsteadiness on feet (R26.81);Muscle weakness (generalized) (M62.81);Difficulty in walking, not elsewhere classified (R26.2)     Time: 6948-5462 PT Time Calculation (min) (ACUTE ONLY): 25 min  Charges:  $Gait Training: 8-22 mins $Therapeutic Exercise: 8-22 mins                     Kreg Shropshire,  DPT 07/22/2022, 4:59 PM

## 2022-07-22 NOTE — TOC Progression Note (Addendum)
Transition of Care [facility]) - Progression Note    Patient Details  Name: Cameron [name] MRN: [id number] Date of Birth: 01-24-1961  Transition [facility]) CM/SW Contact  Laurena Slimmer, RN Phone Number: 07/22/2022, 12:42 PM  Clinical Narrative:    Called to give patient bed offer for [facility]. He stated he would talk with Dr. Arbutus Ped  before making a decision.  4:20pm Spoke with patient. He inquired about if room at [facility] is private if not he may have to make other arrangements like staying with is sister.  RNCM will contact admissions at [facility] about details of room    4:23pm Message sent to [facility] at [facility] regarding private room availability.       Expected Discharge Plan and Services                                               Social Determinants of Health (SDOH) Interventions SDOH Screenings   Food Insecurity: No Food Insecurity (07/18/2022)  Housing: Low Risk  (07/18/2022)  Transportation Needs: No Transportation Needs (07/18/2022)  Utilities: Not At Risk (07/18/2022)  Tobacco Use: High Risk (07/18/2022)    Readmission Risk Interventions    04/04/2021    3:14 PM  Readmission Risk Prevention Plan  Transportation Screening Complete  PCP or Specialist Appt within 3-5 Days Complete  Social Work Consult for [place] Planning/Counseling Complete  Palliative Care Screening Not Applicable  Medication Review Press photographer) Complete

## 2022-07-23 DIAGNOSIS — E785 Hyperlipidemia, unspecified: Secondary | ICD-10-CM | POA: Diagnosis not present

## 2022-07-23 DIAGNOSIS — D72829 Elevated white blood cell count, unspecified: Secondary | ICD-10-CM | POA: Diagnosis not present

## 2022-07-23 DIAGNOSIS — I2510 Atherosclerotic heart disease of native coronary artery without angina pectoris: Secondary | ICD-10-CM

## 2022-07-23 DIAGNOSIS — Z720 Tobacco use: Secondary | ICD-10-CM

## 2022-07-23 DIAGNOSIS — I5023 Acute on chronic systolic (congestive) heart failure: Secondary | ICD-10-CM

## 2022-07-23 DIAGNOSIS — M109 Gout, unspecified: Secondary | ICD-10-CM | POA: Diagnosis not present

## 2022-07-23 DIAGNOSIS — I10 Essential (primary) hypertension: Secondary | ICD-10-CM

## 2022-07-23 LAB — BASIC METABOLIC PANEL
Anion gap: 11 (ref 5–15)
BUN: 27 mg/dL — ABNORMAL HIGH (ref 6–20)
CO2: 23 mmol/L (ref 22–32)
Calcium: 9 mg/dL (ref 8.9–10.3)
Chloride: 102 mmol/L (ref 98–111)
Creatinine, Ser: 1.15 mg/dL (ref 0.61–1.24)
GFR, Estimated: >60 mL/min (ref >60)
Glucose, Bld: 111 mg/dL — ABNORMAL HIGH (ref 70–99)
Potassium: 4.7 mmol/L (ref 3.5–5.1)
Sodium: 136 mmol/L (ref 135–145)

## 2022-07-23 LAB — CBC
HCT: 36.6 % — ABNORMAL LOW (ref 39.0–52.0)
Hemoglobin: 11.1 g/dL — ABNORMAL LOW (ref 13.0–17.0)
MCH: 28 pg (ref 26.0–34.0)
MCHC: 30.3 g/dL (ref 30.0–36.0)
MCV: 92.4 fL (ref 80.0–100.0)
Platelets: 430 10*3/uL — ABNORMAL HIGH (ref 150–400)
RBC: 3.96 MIL/uL — ABNORMAL LOW (ref 4.22–5.81)
RDW: 18.6 % — ABNORMAL HIGH (ref 11.5–15.5)
WBC: 14.4 10*3/uL — ABNORMAL HIGH (ref 4.0–10.5)
nRBC: 0 % (ref 0.0–0.2)

## 2022-07-23 MED ORDER — COLCHICINE 0.6 MG PO TABS: 0.6000 mg | ORAL_TABLET | Freq: Once | ORAL | Status: DC

## 2022-07-23 MED ORDER — COLCHICINE 0.6 MG PO TABS: 0.6000 mg | ORAL_TABLET | ORAL | Status: DC

## 2022-07-23 MED ORDER — METHYLPREDNISOLONE SODIUM SUCC 40 MG IJ SOLR
40.0000 mg | Freq: Every day | INTRAMUSCULAR | Status: DC
  Administered 2022-07-23 – 2022-07-26 (×4): 40 mg via INTRAVENOUS
  Filled 2022-07-23 (×4): qty 1

## 2022-07-23 MED ORDER — COLCHICINE 0.6 MG PO TABS: 0.6000 mg | ORAL_TABLET | Freq: Every day | ORAL | Status: DC

## 2022-07-23 MED ORDER — METOPROLOL SUCCINATE ER 25 MG PO TB24
25.0000 mg | ORAL_TABLET | Freq: Every day | ORAL | Status: DC
  Administered 2022-07-23 – 2022-07-25 (×3): 25 mg via ORAL
  Filled 2022-07-23 (×3): qty 1

## 2022-07-23 MED ORDER — COLCHICINE 0.6 MG PO TABS
0.6000 mg | ORAL_TABLET | Freq: Once | ORAL | Status: AC
  Administered 2022-07-23: 0.6 mg via ORAL
  Filled 2022-07-23: qty 1

## 2022-07-23 MED ORDER — COLCHICINE 0.6 MG PO TABS
0.6000 mg | ORAL_TABLET | Freq: Every day | ORAL | Status: DC
  Administered 2022-07-24 – 2022-07-26 (×3): 0.6 mg via ORAL
  Filled 2022-07-23 (×3): qty 1

## 2022-07-23 MED ORDER — ACETAMINOPHEN 325 MG PO TABS
650.0000 mg | ORAL_TABLET | Freq: Four times a day (QID) | ORAL | Status: DC | PRN
  Administered 2022-07-24: 650 mg via ORAL
  Filled 2022-07-23 (×2): qty 2

## 2022-07-23 NOTE — Progress Notes (Signed)
Progress Note   Patient: Cameron [name] NOM:[id number] DOB: 04/04/1961 DOA: 07/17/2022     6 DOS: the patient was seen and examined on 07/23/2022   Brief hospital course: 62 year old male with coronary artery disease, atrial fibrillation, hyperlipidemia, CAD, GERD, who presents emergency department for chief concerns of bilateral lower extremity swelling and multiple joint pain. Admitted with concerns of CHF and possible septic arthritis. Outpatient PCP hide patient has gout therefore received a course of colchicine and thereafter prescribed allopurinol which he has not started. Arthrocentesis was performed by orthopedic which showed elevated WBC and was suspected to be secondary to acute gout. No organism were seen on micro. In the meantime getting IV diuretics for CHF exacerbation   1/17.  With patient having poor range of motion of his left ankle and left knee more than the right ankle will give 2 doses of colchicine and give a dose of Solu-Medrol this afternoon.  Colchicine interacts with Coreg I will switch the patient's Coreg over to Toprol-XL.  Assessment and Plan: * Acute gouty arthritis 2 doses of colchicine today.  Switch prednisone to Solu-Medrol.  Reevaluate tomorrow.  Continue working with physical therapy.  Patient had a left knee aspiration which showed intracellular monosodium urate crystals.  Acute on chronic systolic CHF (congestive heart failure) (HCC) Continue p.o. Lasix.  Last echocardiogram shows an EF of 40%.  Change Coreg secondary to interaction with colchicine over to Toprol-XL.  Continue spironolactone.  Leukocytosis Likely secondary to gout flare and steroids.  Tobacco abuse - As needed nicotine patch ordered  CAD (coronary artery disease), native coronary artery Coreg changed over to Toprol.  Hyperlipidemia - Atorvastatin 80 mg daily resumed  Essential hypertension Coreg changed to Toprol.  Continue Aldactone.        Subjective: Patient with pain in  knees and ankles and poor range of motion on the left knee and ankle more than the right ankle.  Physical Exam: Vitals:   07/23/22 0741 07/23/22 1041 07/23/22 1111 07/23/22 1131  BP: 116/83   126/79  Pulse: 78   82  Resp: 16 17 18 14   Temp: 97.9 F (36.6 C)   98.1 F (36.7 C)  TempSrc: Oral   Oral  SpO2: 99%   97%  Weight:      Height:       Physical Exam HENT:     Head: Normocephalic.     Mouth/Throat:     Pharynx: No oropharyngeal exudate.  Eyes:     General: Lids are normal.     Conjunctiva/sclera: Conjunctivae normal.  Cardiovascular:     Rate and Rhythm: Normal rate and regular rhythm.     Heart sounds: Normal heart sounds, S1 normal and S2 normal.  Pulmonary:     Breath sounds: No decreased breath sounds, wheezing, rhonchi or rales.  Abdominal:     Palpations: Abdomen is soft.     Tenderness: There is no abdominal tenderness.  Musculoskeletal:     Left knee: Swelling present. Decreased range of motion. Tenderness present.     Right lower leg: Swelling present.     Left lower leg: Swelling present.     Right ankle: Swelling present. Decreased range of motion.     Left ankle: Swelling present. Tenderness present. Decreased range of motion.  Skin:    General: Skin is warm.     Findings: No rash.  Neurological:     Mental Status: He is alert and oriented to person, place, and time.  Data Reviewed: Creatinine 1.15, white blood cell count 14.4, hemoglobin 11.1, platelet count 430  Family Communication: Left message for son  Disposition: Status is: Inpatient Remains inpatient appropriate because: Still very poor range of motion left knee would like to improve range of motion with medications.  Starting colchicine and Solu-Medrol today  Planned Discharge Destination: Home with Home Health versus rehab depending on clinical course    Time spent: 28 minutes  Author: Loletha Grayer, MD 07/23/2022 3:12 PM  For on call review [email or web address].

## 2022-07-23 NOTE — TOC Progression Note (Addendum)
Transition of Care [facility]) - Progression Note    Patient Details  Name: Cameron [name] MRN: [id number] Date of Birth: 1961-04-09  Transition of Care [facility]) CM/SW Contact  Laurena Slimmer, RN Phone Number: 07/23/2022, 12:40 PM  Clinical Narrative:    Patient currently has one bed offer for [facility] with Lavella Lemons in admissions. Private bed can not be offered at this time. Patient will be moved to first of list for private room. Spoke with patient. He is not agreeable to being put on list for private bed. He is hopeful therapy will be optimized and he will go home with [facility]. If disposition changes to [facility] patient does not have a choice of [place] agencies.          Expected Discharge Plan and Services                                               Social Determinants of Health (SDOH) Interventions SDOH Screenings   Food Insecurity: No Food Insecurity (07/18/2022)  Housing: Low Risk  (07/18/2022)  Transportation Needs: No Transportation Needs (07/18/2022)  Utilities: Not At Risk (07/18/2022)  Tobacco Use: High Risk (07/18/2022)    Readmission Risk Interventions    04/04/2021    3:14 PM  Readmission Risk Prevention Plan  Transportation Screening Complete  PCP or Specialist Appt within 3-5 Days Complete  Social Work Consult for [place] Planning/Counseling Complete  Palliative Care Screening Not Applicable  Medication Review Press photographer) Complete

## 2022-07-23 NOTE — Assessment & Plan Note (Addendum)
P.o. Lasix twice daily.  Last echocardiogram shows an EF of 40%.  Changed Coreg secondary to interaction with colchicine over to Toprol-XL.  Continue spironolactone.

## 2022-07-23 NOTE — Progress Notes (Signed)
Physical Therapy Treatment Patient Details Name: Cameron [name] MRN: [id number] DOB: [birth year] Today's Date: 07/23/2022   History of Present Illness 62 y/o male presented to ED on 07/17/22 for bilateral leg swelling and difficulty ambulating. S/p L knee aspiration on 1/11. PMH: CAD, Afib, HTN    PT Comments    Pt continues to c/o gout pain but it is much less severe and he was able to tolerate ~100 ft of ambulation into the hallway.  Still struggling to get up from low (standard) seat heights 2/2 knee ROM and pain, pt with improved mobility but still using UEs to assist with moving L LE.  Overall showing consistent improvement.  Continue to POC, hoping to improve enough to be able to return home but pt is still thinking he'll need rehab.   Recommendations for follow up therapy are one component of a multi-disciplinary discharge planning process, led by the attending physician.  Recommendations may be updated based on patient status, additional functional criteria and insurance authorization.  Follow Up Recommendations  Follow physician's recommendations for discharge plan and follow up therapies Can patient physically be transported by private vehicle: Yes   Assistance Recommended at Discharge Frequent or constant Supervision/Assistance  Patient can return home with the following A little help with walking and/or transfers;A little help with bathing/dressing/bathroom;Assistance with cooking/housework;Direct supervision/assist for medications management;Assist for transportation;Help with stairs or ramp for entrance   Equipment Recommendations  BSC/3in1    Recommendations for Other Services       Precautions / Restrictions Precautions Precautions: Fall Restrictions Weight Bearing Restrictions: No     Mobility  Bed Mobility Overal bed mobility: Needs Assistance Bed Mobility: Supine to Sit     Supine to sit: Min guard     General bed mobility comments: better able to move  LEs with less UE use/reliance, still slow and labored    Transfers Overall transfer level: Needs assistance Equipment used: Rolling walker (2 wheels) Transfers: Sit to/from Stand, Bed to chair/wheelchair/BSC Sit to Stand: Min assist, From elevated surface           General transfer comment: Pt struggled to get LEs underneath him fully, but with forward lean and heavy UE use was unable to rise from standard height bed, raised ~2" and gave min assist to rise.  Pt highly reliant on the walker to unweight but able to maintain standing w/o direct assist    Ambulation/Gait Ambulation/Gait assistance: Min guard Gait Distance (Feet): 100 Feet Assistive device: Rolling walker (2 wheels)         General Gait Details: Pt still hesitant to do a lot of WBing on the L (self selects ER/toe out posturing) but with heavy UE use on the walker was able to maintain a slow but safe bout of ambulation out into the hallway and back.  He showed good effort.   Stairs             Wheelchair Mobility    Modified Rankin (Stroke Patients Only)       Balance Overall balance assessment: Needs assistance Sitting-balance support: Feet supported Sitting balance-Leahy Scale: Good     Standing balance support: Reliant on assistive device for balance, During functional activity, Bilateral upper extremity supported Standing balance-Leahy Scale: Fair Standing balance comment: Pt reliant on UEs/walker but able to maintain much more prolonged time in standing today  Cognition Arousal/Alertness: Awake/alert Behavior During Therapy: WFL for tasks assessed/performed Overall Cognitive Status: Within Functional Limits for tasks assessed                                          Exercises Other Exercises Other Exercises: lightly resisted LAQ, h/s curls, ankle DF/PF, seated marching per tolerance 5-10 reps b/l    General Comments General comments  (skin integrity, edema, etc.): Pt still with gout pain, but much better activity tolerance      Pertinent Vitals/Pain Pain Assessment Pain Assessment: 0-10 Pain Score: 7  Pain Location: L knee and b/l ankles    Home Living                          Prior Function            PT Goals (current goals can now be found in the care plan section) Progress towards PT goals: Progressing toward goals    Frequency    Min 2X/week      PT Plan Current plan remains appropriate    Co-evaluation              AM-PAC PT "6 Clicks" Mobility   Outcome Measure  Help needed turning from your back to your side while in a flat bed without using bedrails?: None Help needed moving from lying on your back to sitting on the side of a flat bed without using bedrails?: A Little Help needed moving to and from a bed to a chair (including a wheelchair)?: A Little Help needed standing up from a chair using your arms (e.g., wheelchair or bedside chair)?: A Little Help needed to walk in hospital room?: A Little Help needed climbing 3-5 steps with a railing? : A Lot 6 Click Score: 18    End of Session Equipment Utilized During Treatment: Gait belt Activity Tolerance: Patient limited by pain Patient left: with bed alarm set;with call bell/phone within reach Nurse Communication: Mobility status PT Visit Diagnosis: Unsteadiness on feet (R26.81);Muscle weakness (generalized) (M62.81);Difficulty in walking, not elsewhere classified (R26.2)     Time: 0947-0962 PT Time Calculation (min) (ACUTE ONLY): 32 min  Charges:  $Gait Training: 8-22 mins $Therapeutic Exercise: 8-22 mins                     Kreg Shropshire, DPT 07/23/2022, 12:26 PM

## 2022-07-23 NOTE — Assessment & Plan Note (Addendum)
Continue daily colchicine and Solu-Medrol switched over to prednisone taper.  Patient has a rheumatology follow-up already scheduled.  Patient interested in going home at this point.  Patient had a left knee aspiration which showed intracellular monosodium urate crystals.

## 2022-07-24 DIAGNOSIS — M109 Gout, unspecified: Secondary | ICD-10-CM | POA: Diagnosis not present

## 2022-07-24 DIAGNOSIS — D72829 Elevated white blood cell count, unspecified: Secondary | ICD-10-CM | POA: Diagnosis not present

## 2022-07-24 DIAGNOSIS — E785 Hyperlipidemia, unspecified: Secondary | ICD-10-CM | POA: Diagnosis not present

## 2022-07-24 DIAGNOSIS — I5023 Acute on chronic systolic (congestive) heart failure: Secondary | ICD-10-CM | POA: Diagnosis not present

## 2022-07-24 LAB — BASIC METABOLIC PANEL
Anion gap: 9 (ref 5–15)
BUN: 29 mg/dL — ABNORMAL HIGH (ref 6–20)
CO2: 26 mmol/L (ref 22–32)
Calcium: 8.7 mg/dL — ABNORMAL LOW (ref 8.9–10.3)
Chloride: 101 mmol/L (ref 98–111)
Creatinine, Ser: 1.16 mg/dL (ref 0.61–1.24)
GFR, Estimated: >60 mL/min (ref >60)
Glucose, Bld: 118 mg/dL — ABNORMAL HIGH (ref 70–99)
Potassium: 4.7 mmol/L (ref 3.5–5.1)
Sodium: 136 mmol/L (ref 135–145)

## 2022-07-24 MED ORDER — FUROSEMIDE 10 MG/ML IJ SOLN
40.0000 mg | Freq: Once | INTRAMUSCULAR | Status: AC
  Administered 2022-07-24: 40 mg via INTRAVENOUS
  Filled 2022-07-24: qty 4

## 2022-07-24 NOTE — Progress Notes (Signed)
Progress Note   Patient: Cameron [name] HDQ:[id number] DOB: 1961-04-08 DOA: 07/17/2022     7 DOS: the patient was seen and examined on 07/24/2022   Brief hospital course: 62 year old male with coronary artery disease, atrial fibrillation, hyperlipidemia, CAD, GERD, who presents emergency department for chief concerns of bilateral lower extremity swelling and multiple joint pain. Admitted with concerns of CHF and possible septic arthritis. Outpatient PCP hide patient has gout therefore received a course of colchicine and thereafter prescribed allopurinol which he has not started. Arthrocentesis was performed by orthopedic which showed elevated WBC and was suspected to be secondary to acute gout. No organism were seen on micro. In the meantime getting IV diuretics for CHF exacerbation   1/17.  With patient having poor range of motion of his left ankle and left knee more than the right ankle will give 2 doses of colchicine and give a dose of Solu-Medrol this afternoon.  Colchicine interacts with Coreg I will switch the patient's Coreg over to Toprol-XL. 1/18.  Better range of motion left knee and left ankle.  Still trying to get a little bit better range of motion.  Patient interested in going home with home health.  Assessment and Plan: * Acute gouty arthritis Continue daily colchicine and Solu-Medrol.  Better range of motion today.  Patient now hopeful to go home.  Reevaluate tomorrow.  Continue working with physical therapy.  Patient had a left knee aspiration which showed intracellular monosodium urate crystals.  Acute on chronic systolic CHF (congestive heart failure) (HCC) Dose of IV Lasix this morning and then p.o. this evening.  Last echocardiogram shows an EF of 40%.  Change Coreg secondary to interaction with colchicine over to Toprol-XL.  Continue spironolactone.  Leukocytosis Likely secondary to gout flare and steroids.  Tobacco abuse - As needed nicotine patch ordered  CAD  (coronary artery disease), native coronary artery Coreg changed over to Toprol.  Hyperlipidemia - Atorvastatin 80 mg daily resumed  Essential hypertension Coreg changed to Toprol.  Continue Aldactone.        Subjective: Patient now only with pain if he bends his knee or ankle.  Physical Exam: Vitals:   07/24/22 0741 07/24/22 0853 07/24/22 1306 07/24/22 1638  BP: 129/88  (!) 147/87 131/75  Pulse: 68  66 69  Resp: 16  18 16   Temp: 97.6 F (36.4 C)  97.7 F (36.5 C) (!) 97.4 F (36.3 C)  TempSrc: Oral     SpO2: 98%  100% 100%  Weight:  82.8 kg    Height:       Physical Exam HENT:     Head: Normocephalic.     Mouth/Throat:     Pharynx: No oropharyngeal exudate.  Eyes:     General: Lids are normal.     Conjunctiva/sclera: Conjunctivae normal.  Cardiovascular:     Rate and Rhythm: Normal rate and regular rhythm.     Heart sounds: Normal heart sounds, S1 normal and S2 normal.  Pulmonary:     Breath sounds: No decreased breath sounds, wheezing, rhonchi or rales.  Abdominal:     Palpations: Abdomen is soft.     Tenderness: There is no abdominal tenderness.  Musculoskeletal:     Left knee: Swelling present. Decreased range of motion. Tenderness present.     Right lower leg: Swelling present.     Left lower leg: Swelling present.     Right ankle: Swelling present.     Left ankle: Swelling present. Tenderness present. Decreased range of  motion.     Comments: Better range of motion than yesterday for left knee and left ankle.  Skin:    General: Skin is warm.     Findings: No rash.  Neurological:     Mental Status: He is alert and oriented to person, place, and time.     Data Reviewed: Creatinine 1.16   Disposition: Status is: Inpatient Remains inpatient appropriate because: Would like to get a little better range of motion in his left knee prior to going home hopefully tomorrow.  Planned Discharge Destination: Home with Home Health    Time spent: 28  minutes  Author: Loletha Grayer, MD 07/24/2022 5:46 PM  For on call review [email or web address].

## 2022-07-24 NOTE — TOC Progression Note (Signed)
Transition of Care [facility]) - Progression Note    Patient Details  Name: Cameron [name] MRN: [id number] Date of Birth: 07-30-1960  [facility]) CM/SW Contact  Laurena Slimmer, RN Phone Number: 07/24/2022, 2:52 PM  Clinical Narrative:    Spoke with patient regarding discharge plans. Patient not agreeable to SNF unless they have a private room. Patient advised he did not have a private room  at SNF, and that could not be guaranteed. Patient stated he would likely go home. Patient would not give his choice of [facility] agencies when asked stating he needed to speak with the therapist and MD first.He was advised TOC coordinates [place] and DME. Patient has been advised discharge planning is proactive.         Expected Discharge Plan and Services                                               Social Determinants of Health (SDOH) Interventions SDOH Screenings   Food Insecurity: No Food Insecurity (07/18/2022)  Housing: Low Risk  (07/18/2022)  Transportation Needs: No Transportation Needs (07/18/2022)  Utilities: Not At Risk (07/18/2022)  Tobacco Use: High Risk (07/18/2022)    Readmission Risk Interventions    04/04/2021    3:14 PM  Readmission Risk Prevention Plan  Transportation Screening Complete  PCP or Specialist Appt within 3-5 Days Complete  Social Work Consult for [place] Planning/Counseling Complete  Palliative Care Screening Not Applicable  Medication Review Press photographer) Complete

## 2022-07-24 NOTE — Progress Notes (Signed)
Mobility Specialist - Progress Note     07/24/22 1036  Mobility  Activity Ambulated with assistance in hallway;Stood at bedside  Level of Assistance Standby assist, set-up cues, supervision of patient - no hands on  Assistive Device Front wheel walker  Distance Ambulated (ft) 160 ft  Activity Response Tolerated well  Mobility Referral Yes  $Mobility charge 1 Mobility   Pt resting in bed on RA upon entry. Pt STS and ambulates in hallway around NS SBA with RW. Pt returned to bed and left with needs in reach.   Loma Sender Mobility Specialist 07/24/22, 10:41 AM

## 2022-07-25 ENCOUNTER — Inpatient Hospital Stay: Payer: BLUE CROSS/BLUE SHIELD

## 2022-07-25 DIAGNOSIS — R310 Gross hematuria: Secondary | ICD-10-CM

## 2022-07-25 DIAGNOSIS — K649 Unspecified hemorrhoids: Secondary | ICD-10-CM

## 2022-07-25 LAB — BASIC METABOLIC PANEL
Anion gap: 6 (ref 5–15)
BUN: 34 mg/dL — ABNORMAL HIGH (ref 6–20)
CO2: 28 mmol/L (ref 22–32)
Calcium: 8.6 mg/dL — ABNORMAL LOW (ref 8.9–10.3)
Chloride: 103 mmol/L (ref 98–111)
Creatinine, Ser: 1.2 mg/dL (ref 0.61–1.24)
GFR, Estimated: >60 mL/min (ref >60)
Glucose, Bld: 95 mg/dL (ref 70–99)
Potassium: 3.9 mmol/L (ref 3.5–5.1)
Sodium: 137 mmol/L (ref 135–145)

## 2022-07-25 LAB — URINALYSIS, COMPLETE (UACMP) WITH MICROSCOPIC
RBC / HPF: >50 RBC/hpf — ABNORMAL HIGH (ref 0–5)
Specific Gravity, Urine: 1.017 (ref 1.005–1.030)
Squamous Epithelial / HPF: NONE SEEN /HPF (ref 0–5)
WBC, UA: >50 WBC/hpf — ABNORMAL HIGH (ref 0–5)

## 2022-07-25 LAB — OCCULT BLOOD X 1 CARD TO LAB, STOOL: Fecal Occult Bld: NEGATIVE

## 2022-07-25 LAB — HEMOGLOBIN: Hemoglobin: 11.7 g/dL — ABNORMAL LOW (ref 13.0–17.0)

## 2022-07-25 MED ORDER — SODIUM CHLORIDE 0.9 % IV SOLN
1.0000 g | INTRAVENOUS | Status: DC
  Administered 2022-07-25: 1 g via INTRAVENOUS
  Filled 2022-07-25: qty 10
  Filled 2022-07-25: qty 1

## 2022-07-25 NOTE — Progress Notes (Signed)
Physical Therapy Treatment Patient Details Name: Cameron [name] MRN: [id number] DOB: 1961-01-03 Today's Date: 07/25/2022   History of Present Illness 62 y/o male presented to ED on 07/17/22 for bilateral leg swelling and difficulty ambulating. S/p L knee aspiration on 1/11. PMH: CAD, Afib, HTN    PT Comments    Pt received upright sitting EoB. Reports gout pain continues to improve. Pt is able to stand to RW at supervision with safe hand placement beginning to ambulate with step through step lengths although limited in getting one foot past the other still needing education and VC's for LLE placement and neutral hip position to limit toeing out leading to increased tripping hazard. Pt able to correct completing 320' at supervision level. Encouraged pt to trial steps with author as pt reports having stairs to enter/exit his home but he defers attempts despite education and encouragement however I do believe with rail support pt could safely perform at current functional capacity. Pt returned to sitting EoB educated on safe d/c home with [facility] PT services. Also educated on using RW compared to 4WW right now due to need for UE support which would lead to increased falls risk with 4WW. Pt does not seem to have concerns d/c'ing home as long as his gout pain is under control. Pt left EOB with all needs in reach.     Recommendations for follow up therapy are one component of a multi-disciplinary discharge planning process, led by the attending physician.  Recommendations may be updated based on patient status, additional functional criteria and insurance authorization.  Follow Up Recommendations  Home health PT Can patient physically be transported by private vehicle: Yes   Assistance Recommended at Discharge Intermittent Supervision/Assistance  Patient can return home with the following A little help with walking and/or transfers;A little help with bathing/dressing/bathroom;Assistance with  cooking/housework;Direct supervision/assist for medications management;Assist for transportation;Help with stairs or ramp for entrance   Equipment Recommendations  BSC/3in1    Recommendations for Other Services       Precautions / Restrictions Precautions Precautions: Fall Restrictions Weight Bearing Restrictions: No     Mobility  Bed Mobility               General bed mobility comments: NT. Sitting EOB upon entry and exit Patient Response: Cooperative  Transfers Overall transfer level: Needs assistance Equipment used: Rolling walker (2 wheels) Transfers: Sit to/from Stand Sit to Stand: Supervision           General transfer comment: able to stand from bedside at lowered surface at supervision level.    Ambulation/Gait Ambulation/Gait assistance: Supervision Gait Distance (Feet): 320 Feet Assistive device: Rolling walker (2 wheels) Gait Pattern/deviations: Step-through pattern, Decreased step length - right, Decreased step length - left       General Gait Details: Progressing to step through gait but limited step through steplengths. Remains with LLE externally rotated outside BOS of RW but able to correct after VC's and education on increased tripping hazard. Still WB through BUE's but reports not as much as prior sessions.   Stairs             Wheelchair Mobility    Modified Rankin (Stroke Patients Only)       Balance Overall balance assessment: Needs assistance Sitting-balance support: Feet supported Sitting balance-Leahy Scale: Good     Standing balance support: Reliant on assistive device for balance, During functional activity, Bilateral upper extremity supported Standing balance-Leahy Scale: Fair Standing balance comment: can stand at bedside without UE  support on RW                            Cognition Arousal/Alertness: Awake/alert Behavior During Therapy: [facility] for tasks assessed/performed Overall Cognitive Status:  Within Functional Limits for tasks assessed                                          Exercises Other Exercises Other Exercises: Educated pt on trialing steps to see if pt can safely enter/exit home since he is moving better. Pt declines efforts saying he is capable of completing steps with rail support    General Comments        Pertinent Vitals/Pain Pain Assessment Pain Assessment: Faces Faces Pain Scale: Hurts a little bit Pain Location: L knee and b/l ankles Pain Descriptors / Indicators: Aching, Discomfort Pain Intervention(s): Limited activity within patient's tolerance, Monitored during session, Repositioned    Home Living                          Prior Function            PT Goals (current goals can now be found in the care plan section) Acute Rehab PT Goals Patient Stated Goal: to reduce pain PT Goal Formulation: With patient Time For Goal Achievement: 08/01/22 Potential to Achieve Goals: Good Progress towards PT goals: Progressing toward goals    Frequency    Min 2X/week      PT Plan Discharge plan needs to be updated    Co-evaluation              AM-PAC PT "6 Clicks" Mobility   Outcome Measure  Help needed turning from your back to your side while in a flat bed without using bedrails?: None Help needed moving from lying on your back to sitting on the side of a flat bed without using bedrails?: A Little Help needed moving to and from a bed to a chair (including a wheelchair)?: A Little Help needed standing up from a chair using your arms (e.g., wheelchair or bedside chair)?: A Little Help needed to walk in hospital room?: A Little Help needed climbing 3-5 steps with a railing? : A Little 6 Click Score: 19    End of Session Equipment Utilized During Treatment: Gait belt Activity Tolerance: Patient tolerated treatment well Patient left: with call bell/phone within reach (seated EOB) Nurse Communication: Mobility  status PT Visit Diagnosis: Unsteadiness on feet (R26.81);Muscle weakness (generalized) (M62.81);Difficulty in walking, not elsewhere classified (R26.2)     Time: 3976-7341 PT Time Calculation (min) (ACUTE ONLY): 17 min  Charges:  $Gait Training: 8-22 mins                     Salem Caster. Fairly IV, PT, DPT Physical Therapist- [facility]  07/25/2022, 10:52 AM

## 2022-07-25 NOTE — Assessment & Plan Note (Addendum)
Add on urine culture just in case infection.  Empiric Rocephin given yesterday will give a few more days of Keflex upon going home.  Holding Eliquis for another day or so then can restart.  If hematuria recurs can set up appointment with urology.  Patient was guaiac negative and no gross blood with rectal exam.  CT renal stone protocol negative.  Hemoglobin higher at 12.0.

## 2022-07-25 NOTE — TOC Progression Note (Signed)
Transition of Care [facility]) - Progression Note    Patient Details  Name: Cameron [name] MRN: [id number] Date of Birth: 1961/03/18  Transition of Care [facility]) CM/SW Contact  Laurena Slimmer, RN Phone Number: 07/25/2022, 12:56 PM  Clinical Narrative:    Spoke with patient regarding recommendation for [facility] PT. Patient is not agreeable to [facility]. "I don't think I'll need it."        Expected Discharge Plan and Services                                               Social Determinants of Health (SDOH) Interventions SDOH Screenings   Food Insecurity: No Food Insecurity (07/18/2022)  Housing: Low Risk  (07/18/2022)  Transportation Needs: No Transportation Needs (07/18/2022)  Utilities: Not At Risk (07/18/2022)  Tobacco Use: High Risk (07/18/2022)    Readmission Risk Interventions    04/04/2021    3:14 PM  Readmission Risk Prevention Plan  Transportation Screening Complete  PCP or Specialist Appt within 3-5 Days Complete  Social Work Consult for [place] Planning/Counseling Complete  Palliative Care Screening Not Applicable  Medication Review Press photographer) Complete

## 2022-07-25 NOTE — Progress Notes (Signed)
Mobility Specialist - Progress Note    07/25/22 1610  Mobility  Activity Ambulated independently in hallway;Stood at bedside  Level of Assistance Modified independent, requires aide device or extra time  Assistive Device Front wheel walker  Distance Ambulated (ft) 320 ft  Activity Response Tolerated well  Mobility Referral Yes  $Mobility charge 1 Mobility   Pt resting in bed on RA upon entry. Pt STS and ambulates to RW in hallway around NS. Pt returned to bed and left with needs in reach.   Loma Sender Mobility Specialist 07/25/22, 4:12 PM

## 2022-07-25 NOTE — Progress Notes (Signed)
Occupational Therapy Treatment Patient Details Name: Cameron [name] MRN: [id number] DOB: 1961-06-21 Today's Date: 07/25/2022   History of present illness 61 y/o male presented to ED on 07/17/22 for bilateral leg swelling and difficulty ambulating. S/p L knee aspiration on 1/11. PMH: CAD, Afib, HTN   OT comments  Upon entering the room, pt supine in bed with no c/o pain and agreeable to OT intervention. Pt reports feeling much better. Pt performs bed mobility without assistance and stands multiple times from bed without assistance. Pt ambulates with RW in room and to bathroom and demonstrates toilet transfers at mod I level. Pt also discussing home set up once more with therapist and pt has all needed equipment. Pt is mod I with all tasks throughout session with use of RW. Pt does not need further skilled OT intervention at this time. OT to complete order.    Recommendations for follow up therapy are one component of a multi-disciplinary discharge planning process, led by the attending physician.  Recommendations may be updated based on patient status, additional functional criteria and insurance authorization.    Follow Up Recommendations  No OT follow up     Assistance Recommended at Discharge None     Equipment Recommendations  None recommended by OT       Precautions / Restrictions Precautions Precautions: Fall Restrictions Weight Bearing Restrictions: No       Mobility Bed Mobility Overal bed mobility: Modified Independent Bed Mobility: Supine to Sit, Sit to Supine                Transfers Overall transfer level: Modified independent Equipment used: Rolling walker (2 wheels) Transfers: Sit to/from Stand Sit to Stand: Modified independent (Device/Increase time)                 Balance Overall balance assessment: Needs assistance Sitting-balance support: Feet supported Sitting balance-Leahy Scale: Good     Standing balance support: Reliant on assistive  device for balance, During functional activity, Bilateral upper extremity supported Standing balance-Leahy Scale: Good                             ADL either performed or assessed with clinical judgement   ADL Overall ADL's : Modified independent     Grooming: Wash/dry hands;Standing                   Toilet Transfer: Modified Independent;Regular Toilet;Rolling walker (2 wheels)   Toileting- Clothing Manipulation and Hygiene: Modified independent;Sit to/from stand       Functional mobility during ADLs: Modified independent;Rolling walker (2 wheels)       Vision Patient Visual Report: No change from baseline            Cognition Arousal/Alertness: Awake/alert Behavior During Therapy: WFL for tasks assessed/performed Overall Cognitive Status: Within Functional Limits for tasks assessed                                                     Pertinent Vitals/ Pain       Pain Assessment Pain Assessment: No/denies pain      Progress Toward Goals  OT Goals(current goals can now be found in the care plan section)  Progress towards OT goals: Progressing toward goals  Acute Rehab OT Goals Patient Stated Goal:  to go home OT Goal Formulation: With patient Time For Goal Achievement: 08/01/22 Potential to Achieve Goals: [place] Discharge plan remains appropriate;Frequency remains appropriate       AM-PAC OT "6 Clicks" Daily Activity     Outcome Measure   Help from another person eating meals?: None Help from another person taking care of personal grooming?: None Help from another person toileting, which includes using toliet, bedpan, or urinal?: None Help from another person bathing (including washing, rinsing, drying)?: None Help from another person to put on and taking off regular upper body clothing?: None Help from another person to put on and taking off regular lower body clothing?: None 6 Click Score: 24    End of  Session Equipment Utilized During Treatment: Rolling walker (2 wheels)  OT Visit Diagnosis: Unsteadiness on feet (R26.81);Muscle weakness (generalized) (M62.81)   Activity Tolerance Patient tolerated treatment well   Patient Left with call bell/phone within reach;in chair;with chair alarm set   Nurse Communication Mobility status        Time: 1048-1100 OT Time Calculation (min): 12 min  Charges: OT General Charges $OT Visit: 1 Visit OT Treatments $Self Care/Home Management : 8-22 mins  Darleen Crocker, MS, OTR/L , CBIS ascom [phone]  07/25/22, 12:25 PM

## 2022-07-25 NOTE — Progress Notes (Addendum)
Progress Note   Patient: Cameron [name] OJJ:[id number] DOB: [birth year] DOA: 07/17/2022     8 DOS: the patient was seen and examined on 07/25/2022   Brief hospital course: 62 year old male with coronary artery disease, atrial fibrillation, hyperlipidemia, CAD, GERD, who presents emergency department for chief concerns of bilateral lower extremity swelling and multiple joint pain. Admitted with concerns of CHF and possible septic arthritis. Outpatient PCP hide patient has gout therefore received a course of colchicine and thereafter prescribed allopurinol which he has not started. Arthrocentesis was performed by orthopedic which showed elevated WBC and was suspected to be secondary to acute gout. No organism were seen on micro. In the meantime getting IV diuretics for CHF exacerbation   1/17.  With patient having poor range of motion of his left ankle and left knee more than the right ankle will give 2 doses of colchicine and give a dose of Solu-Medrol this afternoon.  Colchicine interacts with Coreg I will switch the patient's Coreg over to Toprol-XL. 1/18.  Better range of motion left knee and left ankle.  Still trying to get a little bit better range of motion.  Patient interested in going home with home health. 1/19.  Patient complains of bleeding from the rectum last night and blood in the urine.  Assessment and Plan: * Acute gouty arthritis Continue daily colchicine and Solu-Medrol.  Patient interested in going home at this point.  Patient had a left knee aspiration which showed intracellular monosodium urate crystals.  Gross hematuria Add on urine culture just in case infection.  Empiric Rocephin for now.  Holding Eliquis.  Patient was guaiac negative and no gross blood with rectal exam.  Will get a CT renal protocol to rule out stone  Acute on chronic systolic CHF (congestive heart failure) (HCC) P.o. Lasix twice daily.  Last echocardiogram shows an EF of 40%.  Change Coreg secondary to  interaction with colchicine over to Toprol-XL.  Continue spironolactone.  Leukocytosis Likely secondary to gout flare and steroids.  Tobacco abuse - As needed nicotine patch ordered  CAD (coronary artery disease), native coronary artery Coreg changed over to Toprol.  Hyperlipidemia - Atorvastatin 80 mg daily resumed  Essential hypertension Coreg changed to Toprol.  Continue Aldactone.        Subjective: Patient's knee is feeling better.  This morning he states he had bleeding from his rectum last night and blood in his urine.  We held this morning's Eliquis dose.  Rectal exam did not show any gross blood and guaiac test was negative.  Patient does have external hemorrhoids.  Hematuria on urine analysis.  Admitted with gout.  Physical Exam: Vitals:   07/24/22 2345 07/25/22 0405 07/25/22 0744 07/25/22 1246  BP: 117/72 131/87 128/83 (!) 141/87  Pulse: 64 62 66 64  Resp: 16 18 16 16   Temp: 97.6 F (36.4 C) 97.6 F (36.4 C) 97.7 F (36.5 C) 98.2 F (36.8 C)  TempSrc: Oral Oral Oral Oral  SpO2: 97% 100% 99% 99%  Weight:      Height:       Physical Exam HENT:     Head: Normocephalic.     Mouth/Throat:     Pharynx: No oropharyngeal exudate.  Eyes:     General: Lids are normal.     Conjunctiva/sclera: Conjunctivae normal.  Cardiovascular:     Rate and Rhythm: Normal rate and regular rhythm.     Heart sounds: Normal heart sounds, S1 normal and S2 normal.  Pulmonary:  Breath sounds: No decreased breath sounds, wheezing, rhonchi or rales.  Abdominal:     Palpations: Abdomen is soft.     Tenderness: There is no abdominal tenderness.     Comments: Rectal exam no gross blood seen.  Does have some external hemorrhoids.  Musculoskeletal:     Left knee: Swelling present. Decreased range of motion. Tenderness present.     Right lower leg: Swelling present.     Left lower leg: Swelling present.     Right ankle: Swelling present.     Left ankle: Swelling present.  Tenderness present. Decreased range of motion.     Comments: Better range of motion than yesterday for left knee and left ankle.  Skin:    General: Skin is warm.     Findings: No rash.  Neurological:     Mental Status: He is alert and oriented to person, place, and time.     Data Reviewed: Hemoglobin 11.7, creatinine 1.2, guaiac negative Urinalysis positive for blood  Disposition: Status is: Inpatient Remains inpatient appropriate because: Holding Eliquis.  Will start antibiotic for possible urine infection.  CT renal protocol to rule out stone.  Planned Discharge Destination: Home    Time spent: 28 minutes  Author: Loletha Grayer, MD 07/25/2022 2:57 PM  For on call review [email or web address].

## 2022-07-26 DIAGNOSIS — I219 Acute myocardial infarction, unspecified: Secondary | ICD-10-CM

## 2022-07-26 DIAGNOSIS — K649 Unspecified hemorrhoids: Secondary | ICD-10-CM

## 2022-07-26 LAB — BASIC METABOLIC PANEL
Anion gap: 9 (ref 5–15)
BUN: 34 mg/dL — ABNORMAL HIGH (ref 6–20)
CO2: 26 mmol/L (ref 22–32)
Calcium: 8.6 mg/dL — ABNORMAL LOW (ref 8.9–10.3)
Chloride: 102 mmol/L (ref 98–111)
Creatinine, Ser: 1.1 mg/dL (ref 0.61–1.24)
GFR, Estimated: >60 mL/min (ref >60)
Glucose, Bld: 95 mg/dL (ref 70–99)
Potassium: 4.1 mmol/L (ref 3.5–5.1)
Sodium: 137 mmol/L (ref 135–145)

## 2022-07-26 LAB — HEMOGLOBIN: Hemoglobin: 12 g/dL — ABNORMAL LOW (ref 13.0–17.0)

## 2022-07-26 MED ORDER — CEPHALEXIN 500 MG PO CAPS: 500.0000 mg | ORAL_CAPSULE | Freq: Three times a day (TID) | ORAL | 0 refills | Status: AC

## 2022-07-26 MED ORDER — FUROSEMIDE 40 MG PO TABS: 40.0000 mg | ORAL_TABLET | Freq: Two times a day (BID) | ORAL | 0 refills | Status: DC

## 2022-07-26 MED ORDER — PREDNISONE 10 MG PO TABS: ORAL_TABLET | ORAL | 0 refills | Status: DC

## 2022-07-26 MED ORDER — COLCHICINE 0.6 MG PO TABS: 0.6000 mg | ORAL_TABLET | Freq: Every day | ORAL | 0 refills | Status: DC

## 2022-07-26 MED ORDER — OXYCODONE-ACETAMINOPHEN 5-325 MG PO TABS: 1.0000 | ORAL_TABLET | Freq: Four times a day (QID) | ORAL | 0 refills | Status: DC | PRN

## 2022-07-26 MED ORDER — PANTOPRAZOLE SODIUM 40 MG PO TBEC: 40.0000 mg | DELAYED_RELEASE_TABLET | Freq: Every day | ORAL | 0 refills | Status: DC

## 2022-07-26 MED ORDER — METOPROLOL SUCCINATE ER 25 MG PO TB24
12.5000 mg | ORAL_TABLET | Freq: Every day | ORAL | 0 refills | Status: AC
Start: 2022-07-26 — End: ?

## 2022-07-26 MED ORDER — NICOTINE 14 MG/24HR TD PT24: MEDICATED_PATCH | TRANSDERMAL | 0 refills | Status: DC

## 2022-07-26 MED ORDER — HYDROCORTISONE ACETATE 25 MG RE SUPP: RECTAL | 0 refills | Status: DC

## 2022-07-26 NOTE — Discharge Summary (Signed)
Physician Discharge Summary   Patient: Cameron [name] MRN: [id number] DOB: [birth year]  Admit date:     07/17/2022  Discharge date: 07/26/22  Discharge Physician: Alford Highland   PCP: None  Recommendations at discharge:   Refer to new PCP CHF clinic Has appointment with rheumatologist If hematuria continues given the name of a urologist Given the name of gastroenterologist for colonoscopy  Discharge Diagnoses: Principal Problem:   Acute gouty arthritis Active Problems:   Gross hematuria   Acute on chronic systolic CHF (congestive heart failure) (HCC)   Essential hypertension   Hyperlipidemia   CAD (coronary artery disease), native coronary artery   Tobacco abuse   Mural thrombus of atrium (HCC)   Leukocytosis   Hemorrhoids    Hospital Course: 62 year old male with coronary artery disease, atrial fibrillation, hyperlipidemia, CAD, GERD, who presents emergency department for chief concerns of bilateral lower extremity swelling and multiple joint pain. Admitted with concerns of CHF and possible septic arthritis. Outpatient PCP hide patient has gout therefore received a course of colchicine and thereafter prescribed allopurinol which he has not started. Arthrocentesis was performed by orthopedic which showed elevated WBC and was suspected to be secondary to acute gout. No organism were seen on micro. In the meantime getting IV diuretics for CHF exacerbation   1/17.  With patient having poor range of motion of his left ankle and left knee more than the right ankle will give 2 doses of colchicine and give a dose of Solu-Medrol this afternoon.  Colchicine interacts with Coreg I will switch the patient's Coreg over to Toprol-XL. 1/18.  Better range of motion left knee and left ankle.  Still trying to get a little bit better range of motion.  Patient interested in going home with home health. 1/19.  Patient complains of bleeding from the rectum last night and blood in the  urine. 1/20.  With holding Eliquis urine has improved a bit.  Patient will hold Eliquis another day and then can go back on Eliquis as outpatient.  If hematuria returns then can set up appointment with urology.  Hemoglobin stable.  Would recommend setting up outpatient colonoscopy.  Assessment and Plan: * Acute gouty arthritis Continue daily colchicine and Solu-Medrol switched over to prednisone taper.  Patient has a rheumatology follow-up already scheduled.  Patient interested in going home at this point.  Patient had a left knee aspiration which showed intracellular monosodium urate crystals.  Gross hematuria Add on urine culture just in case infection.  Empiric Rocephin given yesterday will give a few more days of Keflex upon going home.  Holding Eliquis for another day or so then can restart.  If hematuria recurs can set up appointment with urology.  Patient was guaiac negative and no gross blood with rectal exam.  CT renal stone protocol negative.  Hemoglobin higher at 12.0.  Acute on chronic systolic CHF (congestive heart failure) (HCC) P.o. Lasix twice daily.  Last echocardiogram shows an EF of 40%.  Changed Coreg secondary to interaction with colchicine over to Toprol-XL.  Continue spironolactone.  Hemorrhoids Anusol suppository as needed.  Patient was guaiac positive here and hemoglobin stable at 12.  Would recommend outpatient screening colonoscopy anyway for him.  Leukocytosis Likely secondary to gout flare and steroids.  Mural thrombus of atrium (HCC) This was diagnosed back in 2017.  Echocardiograms after that did not show the mural thrombus.  I am okay with holding the Eliquis for a few days and then restarting as outpatient.  If hematuria  continues will need urology appointment as outpatient.  Recommend screening colonoscopy for him anyway.  Tobacco abuse - As needed nicotine patch ordered  CAD (coronary artery disease), native coronary artery Coreg changed over to  Toprol.  Hyperlipidemia - Atorvastatin 80 mg daily resumed  Essential hypertension Coreg changed to Toprol.  Continue Aldactone.         Consultants: None Procedures performed: None Disposition: Home health Diet recommendation:  Cardiac diet DISCHARGE MEDICATION: Allergies as of 07/26/2022   No Known Allergies      Medication List     STOP taking these medications    amiodarone 200 MG tablet Commonly known as: PACERONE   apixaban 5 MG Tabs tablet Commonly known as: ELIQUIS   carvedilol 3.125 MG tablet Commonly known as: COREG   collagenase 250 UNIT/GM ointment Commonly known as: SANTYL   doxycycline 100 MG capsule Commonly known as: VIBRAMYCIN   HYDROcodone-acetaminophen 5-325 MG tablet Commonly known as: NORCO/VICODIN   sucralfate 1 g tablet Commonly known as: Carafate       TAKE these medications    acetaminophen 500 MG tablet Commonly known as: TYLENOL Take 500 mg by mouth daily.   allopurinol 100 MG tablet Commonly known as: ZYLOPRIM Take 100 mg by mouth daily.   atorvastatin 80 MG tablet Commonly known as: LIPITOR Take 1 tablet (80 mg total) by mouth daily.   cephALEXin 500 MG capsule Commonly known as: KEFLEX Take 1 capsule (500 mg total) by mouth 3 (three) times daily for 4 days.   colchicine 0.6 MG tablet Take 1 tablet (0.6 mg total) by mouth daily. Start taking on: July 27, 2022 What changed:  when to take this reasons to take this   Farxiga 10 MG Tabs tablet Generic drug: dapagliflozin propanediol Take 10 mg by mouth daily.   furosemide 40 MG tablet Commonly known as: LASIX Take 1 tablet (40 mg total) by mouth 2 (two) times daily. What changed:  medication strength how much to take   hydrocortisone 25 MG suppository Commonly known as: ANUSOL-HC One suppository twice a day as needed for hemorrhoids.  (Generic okay to substitute)   metoprolol succinate 25 MG 24 hr tablet Commonly known as: TOPROL-XL Take 0.5  tablets (12.5 mg total) by mouth at bedtime.   nicotine 14 mg/24hr patch Commonly known as: NICODERM CQ - dosed in mg/24 hours One 14 mg patch chest wall daily as needed for nicotine craving.  (Can substitute generic)   nitroGLYCERIN 0.4 MG SL tablet Commonly known as: NITROSTAT Place under the tongue.   oxyCODONE-acetaminophen 5-325 MG tablet Commonly known as: PERCOCET/ROXICET Take 1 tablet by mouth every 6 (six) hours as needed for moderate pain.   pantoprazole 40 MG tablet Commonly known as: Protonix Take 1 tablet (40 mg total) by mouth daily.   predniSONE 10 MG tablet Commonly known as: DELTASONE 4 tabs po day 1; 3 tabs po day2,3,4; 2 tabs po day5,6,7; 1 tab po day8,9,10; 1/2 tab po day11,12,13,14 What changed:  medication strength how much to take how to take this when to take this additional instructions   spironolactone 25 MG tablet Commonly known as: ALDACTONE Take 1 tablet (25 mg total) by mouth 2 (two) times daily.        Follow-up Information     Enid Baas, MD Follow up.   Specialty: Internal Medicine Why: call to set up new appointment Contact information: [address] [place] [address] [phone]         Linus Galas,  DPM Follow up in 2 week(s).   Specialty: Podiatry Contact information: [place] [address] [phone]         Jonathon Bellows, MD Follow up.   Specialty: Gastroenterology Why: call to set up outpatient colonoscopy Contact information: [place] [address] [phone]         Abbie Sons, MD Follow up.   Specialty: Urology Why: call if any further blood in the urine Contact information: [place] [address] [phone]                Discharge Exam: Danley Danker Weights   07/22/22 0756 07/24/22 0853 07/25/22 2044  Weight: 84.3 kg 82.8 kg 84.2 kg   Physical Exam HENT:     Head: Normocephalic.      Mouth/Throat:     Pharynx: No oropharyngeal exudate.  Eyes:     General: Lids are normal.     Conjunctiva/sclera: Conjunctivae normal.  Cardiovascular:     Rate and Rhythm: Normal rate and regular rhythm.     Heart sounds: Normal heart sounds, S1 normal and S2 normal.  Pulmonary:     Breath sounds: No decreased breath sounds, wheezing, rhonchi or rales.  Abdominal:     Palpations: Abdomen is soft.     Tenderness: There is no abdominal tenderness.  Musculoskeletal:     Left knee: Swelling present. Decreased range of motion.     Right lower leg: Swelling present.     Left lower leg: Swelling present.     Right ankle: Swelling present.     Left ankle: Swelling present. Tenderness present.  Skin:    General: Skin is warm.     Findings: No rash.  Neurological:     Mental Status: He is alert and oriented to person, place, and time.      Condition at discharge: serious  The results of significant diagnostics from this hospitalization (including imaging, microbiology, ancillary and laboratory) are listed below for reference.   Imaging Studies: CT RENAL STONE STUDY  Result Date: 07/25/2022 CLINICAL DATA:  Abdominal pain and flank pain EXAM: CT ABDOMEN AND PELVIS WITHOUT CONTRAST TECHNIQUE: Multidetector CT imaging of the abdomen and pelvis was performed following the standard protocol without IV contrast. RADIATION DOSE REDUCTION: This exam was performed according to the departmental dose-optimization program which includes automated exposure control, adjustment of the mA and/or kV according to patient size and/or use of iterative reconstruction technique. COMPARISON:  None Available. FINDINGS: Lower chest: Lung bases are clear. Hepatobiliary: No focal hepatic lesion. Postcholecystectomy. No biliary duct dilatation. Common bile duct is normal. Pancreas: Pancreas is normal. No ductal dilatation. No pancreatic inflammation. Spleen: Normal spleen Adrenals/urinary tract: Adrenal glands normal.  RIGHT kidney is small compared to the LEFT. No nephrolithiasis. Vascular calcifications in the renal hila. No ureterolithiasis or obstructive uropathy. No bladder calculi. Stomach/Bowel: Stomach, small bowel, appendix, and cecum are normal. Multiple diverticula of the descending colon and sigmoid colon without acute inflammation. Vascular/Lymphatic: Abdominal aorta is normal caliber with atherosclerotic calcification. There is no retroperitoneal or periportal lymphadenopathy. No pelvic lymphadenopathy. Reproductive: Unremarkable Other: LEFT abdominal wall hernia at presumed prior laparoscopic port site. Musculoskeletal: No aggressive osseous lesion. IMPRESSION: 1. No nephrolithiasis, ureterolithiasis, or obstructive uropathy. 2. No bladder calculi. 3. LEFT colon diverticulosis without diverticulitis. 4.  Aortic Atherosclerosis (ICD10-I70.0). Electronically Signed   By: Suzy Bouchard M.D.   On: 07/25/2022 15:32   ECHOCARDIOGRAM COMPLETE  Result Date: 07/19/2022  ECHOCARDIOGRAM REPORT   Patient Name:   BENCE TRAPP Date of Exam: 07/18/2022 Medical Rec #:  [id number]         Height:       67.0 in Accession #:    [id number]        Weight:       185.0 lb Date of Birth:  [birth year]         BSA:          1.956 m Patient Age:    61 years          BP:           106/59 mmHg Patient Gender: M                 HR:           69 bpm. Exam Location:  [facility] Procedure: 2D Echo, Color Doppler and Cardiac Doppler Indications:     Dyspnea R06.00  History:         Patient has prior history of Echocardiogram examinations, most                  recent 02/18/2020. CHF, CAD; Risk Factors:Hypertension. STEMI.  Sonographer:     Cristela Blue Referring Phys:  [id number] AMY N COX Diagnosing Phys: Alwyn Pea MD  Sonographer Comments: Suboptimal apical window. IMPRESSIONS  1. Left ventricular ejection fraction, by estimation, is 40 to 45%. The left ventricle has mildly decreased function. The left ventricle demonstrates global  hypokinesis. The left ventricular internal cavity size was moderately to severely dilated. Left ventricular diastolic parameters are consistent with Grade II diastolic dysfunction (pseudonormalization).  2. Right ventricular systolic function is low normal. The right ventricular size is mildly enlarged. Mildly increased right ventricular wall thickness.  3. Left atrial size was moderately dilated.  4. Right atrial size was mild to moderately dilated.  5. The mitral valve is grossly normal. Trivial mitral valve regurgitation.  6. The aortic valve is grossly normal. Aortic valve regurgitation is not visualized. FINDINGS  Left Ventricle: Left ventricular ejection fraction, by estimation, is 40 to 45%. The left ventricle has mildly decreased function. The left ventricle demonstrates global hypokinesis. The left ventricular internal cavity size was moderately to severely dilated. There is no left ventricular hypertrophy. Left ventricular diastolic parameters are consistent with Grade II diastolic dysfunction (pseudonormalization). Right Ventricle: The right ventricular size is mildly enlarged. Mildly increased right ventricular wall thickness. Right ventricular systolic function is low normal. Left Atrium: Left atrial size was moderately dilated. Right Atrium: Right atrial size was mild to moderately dilated. Pericardium: There is no evidence of pericardial effusion. Mitral Valve: The mitral valve is grossly normal. Trivial mitral valve regurgitation. Tricuspid Valve: The tricuspid valve is normal in structure. Tricuspid valve regurgitation is mild. Aortic Valve: The aortic valve is grossly normal. Aortic valve regurgitation is not visualized. Aortic valve mean gradient measures 4.0 mmHg. Aortic valve peak gradient measures 7.1 mmHg. Aortic valve area, by VTI measures 2.39 cm. Pulmonic Valve: The pulmonic valve was grossly normal. Pulmonic valve regurgitation is not visualized. Aorta: The ascending aorta was not well  visualized. IAS/Shunts: No atrial level shunt detected by color flow Doppler.  LEFT VENTRICLE PLAX 2D LVIDd:         5.50 cm      Diastology LVIDs:         4.30 cm      LV e' medial:    4.79 cm/s LV PW:  1.10 cm      LV E/e' medial:  21.7 LV IVS:        1.00 cm      LV e' lateral:   10.30 cm/s LVOT diam:     2.10 cm      LV E/e' lateral: 10.1 LV SV:         56 LV SV Index:   29 LVOT Area:     3.46 cm  LV Volumes (MOD) LV vol d, MOD A2C: 156.0 ml LV vol d, MOD A4C: 171.0 ml LV vol s, MOD A2C: 108.0 ml LV vol s, MOD A4C: 103.0 ml LV SV MOD A2C:     48.0 ml LV SV MOD A4C:     171.0 ml LV SV MOD BP:      53.1 ml RIGHT VENTRICLE RV Basal diam:  3.50 cm RV Mid diam:    3.00 cm RV S prime:     7.51 cm/s TAPSE (M-mode): 2.0 cm LEFT ATRIUM              Index        RIGHT ATRIUM           Index LA diam:        4.50 cm  2.30 cm/m   RA Area:     21.70 cm LA Vol (A2C):   109.0 ml 55.72 ml/m  RA Volume:   63.70 ml  32.56 ml/m LA Vol (A4C):   78.7 ml  40.23 ml/m LA Biplane Vol: 95.4 ml  48.77 ml/m  AORTIC VALVE AV Area (Vmax):    2.59 cm AV Area (Vmean):   2.36 cm AV Area (VTI):     2.39 cm AV Vmax:           133.00 cm/s AV Vmean:          89.800 cm/s AV VTI:            0.236 m AV Peak Grad:      7.1 mmHg AV Mean Grad:      4.0 mmHg LVOT Vmax:         99.60 cm/s LVOT Vmean:        61.300 cm/s LVOT VTI:          0.163 m LVOT/AV VTI ratio: 0.69  AORTA Ao Root diam: 3.20 cm MITRAL VALVE                TRICUSPID VALVE MV Area (PHT): 4.71 cm     TR Peak grad:   36.7 mmHg MV Decel Time: 161 msec     TR Vmax:        303.00 cm/s MV E velocity: 104.00 cm/s MV A velocity: 61.30 cm/s   SHUNTS MV E/A ratio:  1.70         Systemic VTI:  0.16 m                             Systemic Diam: 2.10 cm Alwyn Pea MD Electronically signed by Alwyn Pea MD Signature Date/Time: 07/19/2022/9:37:47 PM    Final    MR ANKLE LEFT W WO CONTRAST  Result Date: 07/17/2022 CLINICAL DATA:  Ankle pain, chronic inflammatory arthritis  suspected EXAM: MRI OF THE LEFT ANKLE WITHOUT AND WITH CONTRAST TECHNIQUE: Multiplanar, multisequence MR imaging of the ankle was performed before and after the administration of intravenous contrast. CONTRAST:  61mL GADAVIST GADOBUTROL 1 MMOL/ML IV SOLN COMPARISON:  None Available. FINDINGS: TENDONS Peroneal: Peroneal longus tendon intact. Peroneal brevis intact. Fluid tracking along the peroneal tendons suggesting tenosynovitis Posteromedial: Posterior tibial tendon intact. Flexor hallucis longus tendon intact. Flexor digitorum longus tendon intact. Anterior: Tibialis anterior tendon intact with small amount of surrounding fluid suggesting tenosynovitis. Extensor hallucis longus tendon intact Extensor digitorum longus tendon intact. Achilles: Thickening of the Achilles tendon with mild surrounding edema as well as calcaneal edema about its insertion concerning for tendinosis with peritenonitis. No evidence of tear. Plantar Fascia: Intact. LIGAMENTS Lateral: Anterior talofibular ligament intact. Calcaneofibular ligament intact. Posterior talofibular ligament intact with edema of the talus about its insertion. Anterior and posterior tibiofibular ligaments intact. Medial: Deltoid ligament intact. Spring ligament intact. CARTILAGE Ankle Joint: Trace joint effusion. Normal ankle mortise. Osteochondral injury about the lateral aspect of the talar dome Subtalar Joints/Sinus Tarsi: Normal subtalar joints. No subtalar joint effusion. Normal sinus tarsi. Bones: Bone marrow edema of the lateral cuneiform bone and fourth metatarsal base. No fracture or dislocation. Soft Tissue: Generalized soft tissue swelling about the medial and lateral aspect of the ankle and foot. No fluid collection or hematoma no fluid collection or hematoma. Muscles are normal without edema or atrophy. Tarsal tunnel is normal. IMPRESSION: IMPRESSION 1. Bone marrow edema of the lateral cuneiform bone and fourth metatarsal base concerning for bone  contusion/stress injury. No evidence of fracture or dislocation. 2. Thickening of the Achilles tendon with mild surrounding edema concerning for tendinosis with peritenonitis. 3. Osteochondral injury about the lateral aspect of the talar dome. 4. Edema of the posterior talofibular ligament about its insertion concerning for sprain. 5. Tenosynovitis of the peroneal tendons and tibialis anterior tendon. 6. Generalized soft tissue swelling about the ankle and foot without evidence of fluid collection or hematoma. 7. No MR evidence of septic arthritis or osteomyelitis. Electronically Signed   By: Larose HiresImran  Ahmed D.O.   On: 07/17/2022 21:11   MR KNEE LEFT W WO CONTRAST  Result Date: 07/17/2022 CLINICAL DATA:  Septic arthritis suspected 62 years old male with coronary artery disease. EXAM: MRI OF THE LEFT KNEE WITHOUT AND WITH CONTRAST TECHNIQUE: Multiplanar, multisequence MR imaging of the left knee was performed both before and after administration of intravenous contrast. CONTRAST:  8mL GADAVIST GADOBUTROL 1 MMOL/ML IV SOLN COMPARISON:  Radiographs dated July 17, 2022 FINDINGS: MENISCI Medial: Horizontal oblique undersurface tear of the posterior horn of the medial meniscus with small parameniscal cyst. Lateral: Intact. LIGAMENTS Cruciates: ACL and PCL are intact. Collaterals: Mild edema along the medial collateral ligament suggesting ligamentous sprain without tear lateral collateral ligament complex is intact. CARTILAGE Patellofemoral: Focal full-thickness cartilage defect of the lateral trochlea with subchondral cyst Medial: Focal cartilage defect with cystic change of the non weight-bearing area of the medial femoral condyle. Lateral:  No chondral defect. JOINT: Small joint effusion. Normal Hoffa's fat-pad. Mild synovial thickening and enhancement. POPLITEAL FOSSA: Popliteus tendon is intact. No Baker's cyst. Vascular graft of the popliteal artery. EXTENSOR MECHANISM: Intact quadriceps tendon. Thickening of the  patellar tendon with mild edema. Intact lateral patellar retinaculum. Intact medial patellar retinaculum. Intact MPFL. BONES: No aggressive osseous lesion. No fracture or dislocation. Other: Mild subcutaneous soft tissue edema. No fluid collection or hematoma. Muscles are normal. IMPRESSION: 1. Marrow signal is within normal limits. No evidence of osteomyelitis or acute osseous abnormality 2. Horizontal oblique undersurface tear of the posterior horn of the medial meniscus with small parameniscal cyst. 3. Mild edema along the medial collateral ligament suggesting ligamentous sprain without evidence of tear.  4. Mild patellofemoral and medial tibiofemoral osteoarthritis. 5. Small joint effusion with mild synovitis. 6. Mild thickening of the patellar tendon with mild edema, may represent mild tendinosis. Electronically Signed   By: Larose HiresImran  Ahmed D.O.   On: 07/17/2022 20:58   DG Wrist Complete Right  Result Date: 07/17/2022 CLINICAL DATA:  Right wrist pain and swelling. EXAM: RIGHT WRIST - COMPLETE 3+ VIEW COMPARISON:  None Available. FINDINGS: There is no evidence of acute fracture or dislocation. There is no evidence of arthropathy. Old fifth metacarpal fracture is noted. Soft tissues are unremarkable. IMPRESSION: No acute abnormality seen. Electronically Signed   By: Lupita RaiderJames  Green Jr M.D.   On: 07/17/2022 13:20   DG Knee Complete 4 Views Left  Result Date: 07/17/2022 CLINICAL DATA:  Left knee pain and swelling. EXAM: LEFT KNEE - COMPLETE 4+ VIEW COMPARISON:  None Available. FINDINGS: No evidence of fracture, dislocation, or joint effusion. No evidence of arthropathy or other focal bone abnormality. Multiple vascular stents are noted in the soft tissues. IMPRESSION: No acute abnormality seen in the left knee. Electronically Signed   By: Lupita RaiderJames  Green Jr M.D.   On: 07/17/2022 13:19   DG Ankle Complete Left  Result Date: 07/17/2022 CLINICAL DATA:  Left ankle pain and swelling. EXAM: LEFT ANKLE COMPLETE - 3+  VIEW COMPARISON:  None Available. FINDINGS: There is no evidence of fracture, dislocation, or joint effusion. There is no evidence of arthropathy or other focal bone abnormality. Soft tissues are unremarkable. IMPRESSION: Negative. Electronically Signed   By: Lupita RaiderJames  Green Jr M.D.   On: 07/17/2022 13:17   DG Chest 2 View  Result Date: 07/17/2022 CLINICAL DATA:  Shortness of breath EXAM: CHEST - 2 VIEW COMPARISON:  CXR 07/07/21 FINDINGS: No pleural effusion. No pneumothorax. No focal airspace opacity. No displaced rib fractures. Visualized upper abdomen is unremarkable. Prominent cardiac contours with a possible associated pericardial effusion. IMPRESSION: 1. No focal airspace opacity. 2. Prominent cardiac contours with a possible associated pericardial effusion. Recommend further evaluation with echocardiography. Electronically Signed   By: Lorenza CambridgeHemant  Desai M.D.   On: 07/17/2022 12:31   DG Foot Complete Left  Result Date: 06/27/2022 CLINICAL DATA:  Pain and swelling EXAM: LEFT FOOT - COMPLETE 3+ VIEW COMPARISON:  08/17/2020 FINDINGS: No fracture or dislocation of the left foot. Joint spaces are preserved. Small plantar and Achilles calcaneal spurs. Diffuse soft tissue edema about the foot and ankle. Vascular calcinosis. IMPRESSION: 1. No fracture or dislocation of the left foot. Joint spaces are preserved. 2. Diffuse soft tissue edema about the foot and ankle. Electronically Signed   By: Jearld LeschAlex D Bibbey M.D.   On: 06/27/2022 17:27    Microbiology: Results for orders placed or performed during the hospital encounter of 07/17/22  Blood culture (routine x 2)     Status: None   Collection Time: 07/17/22  1:27 PM   Specimen: BLOOD  Result Value Ref Range Status   Specimen Description BLOOD  RIGHT ARM  Final   Special Requests   Final    BOTTLES DRAWN AEROBIC AND ANAEROBIC Blood Culture results may not be optimal due to an excessive volume of blood received in culture bottles   Culture   Final    NO GROWTH 5  DAYS Performed at [facility], [address] [place], [place] [id number]    Report Status 07/22/2022 FINAL  Final  Blood culture (routine x 2)     Status: None   Collection Time: 07/17/22  1:27 PM  Specimen: BLOOD  Result Value Ref Range Status   Specimen Description BLOOD  RIGHT HAND  Final   Special Requests   Final    BOTTLES DRAWN AEROBIC AND ANAEROBIC Blood Culture results may not be optimal due to an inadequate volume of blood received in culture bottles   Culture   Final    NO GROWTH 5 DAYS Performed at [facility], [address] [place], [place] [address]    Report Status 07/22/2022 FINAL  Final  Body fluid culture w Gram Stain     Status: None   Collection Time: 07/17/22  3:25 PM   Specimen: Synovium; Body Fluid  Result Value Ref Range Status   Specimen Description   Final    SYNOVIAL Performed at [facility], [address] [place], [place] [address]    Special Requests   Final    [facility] KNEE Performed at [facility], [address] [place], [place] [address]    Gram Stain   Final    MODERATE WBC PRESENT, PREDOMINANTLY PMN NO ORGANISMS SEEN    Culture   Final    NO GROWTH Performed at [facility], [place] [address] [place], [place] [address]    Report Status 07/21/2022 FINAL  Final    Labs: CBC: Recent Labs  Lab 07/20/22 0400 07/21/22 0449 07/22/22 0621 07/23/22 0423 07/25/22 0937 07/26/22 0747  WBC 11.2* 11.9* 13.7* 14.4*  --   --   HGB 10.1* 10.3* 10.7* 11.1* 11.7* 12.0*  HCT 32.6* 32.9* 34.5* 36.6*  --   --   MCV 93.1 93.2 91.8 92.4  --   --   PLT 289 351 411* 430*  --   --    Basic Metabolic Panel: Recent Labs  Lab 07/20/22 0400 07/21/22 0449 07/22/22 0621 07/23/22 0423 07/24/22 0634 07/25/22 0513 07/26/22 0747  NA 135 135 136 136 136 137 137  K 4.1 4.2 4.2 4.7 4.7 3.9 4.1  CL 100 100 101 102 101 103 102  CO2 25 24 27 23 26 28 26   GLUCOSE 94 107* 107* 111* 118* 95 95  BUN 20 21*  25* 27* 29* 34* 34*  CREATININE 1.27* 1.32* 1.19 1.15 1.16 1.20 1.10  CALCIUM 8.3* 8.3* 8.9 9.0 8.7* 8.6* 8.6*  MG 2.2 2.3 2.4  --   --   --   --      Discharge time spent: greater than 30 minutes.  Signed: Loletha Grayer, MD [facility] 07/26/2022

## 2022-07-26 NOTE — Assessment & Plan Note (Signed)
Anusol suppository as needed.  Patient was guaiac positive here and hemoglobin stable at 12.  Would recommend outpatient screening colonoscopy anyway for him.

## 2022-07-26 NOTE — Assessment & Plan Note (Signed)
This was diagnosed back in 2017.  Echocardiograms after that did not show the mural thrombus.  I am okay with holding the Eliquis for a few days and then restarting as outpatient.  If hematuria continues will need urology appointment as outpatient.  Recommend screening colonoscopy for him anyway.

## 2022-08-04 ENCOUNTER — Other Ambulatory Visit (INDEPENDENT_AMBULATORY_CARE_PROVIDER_SITE_OTHER): Payer: Self-pay | Admitting: Nurse Practitioner

## 2022-08-04 DIAGNOSIS — I739 Peripheral vascular disease, unspecified: Secondary | ICD-10-CM

## 2022-08-04 DIAGNOSIS — M79672 Pain in left foot: Secondary | ICD-10-CM

## 2022-08-05 ENCOUNTER — Encounter (INDEPENDENT_AMBULATORY_CARE_PROVIDER_SITE_OTHER): Payer: Commercial Managed Care - PPO

## 2022-08-05 ENCOUNTER — Encounter (INDEPENDENT_AMBULATORY_CARE_PROVIDER_SITE_OTHER): Payer: Commercial Managed Care - PPO | Admitting: Vascular Surgery

## 2022-08-06 NOTE — Progress Notes (Deleted)
   Patient ID: Cameron [name], male    DOB: 08/30/1960, 61 y.o.   MRN: [id number]  HPI  Cameron [name] is a 62 y/o male with a history of   Echo 07/18/22 showed an EF of 40-45% along with moderate LAE/ RAE and mod/ severely dilated left ventricle.   LHC done 06/17/16: Prox Cx to Mid Cx lesion, 100 %stenosed. Ost LAD to Mid LAD lesion, 100 %stenosed. Ost 1st Diag to 1st Diag lesion, 100 %stenosed. Ost RCA to Prox RCA lesion, 60 %stenosed. Mid RCA lesion, 50 %stenosed. Dist RCA lesion, 75 %stenosed. There is moderate to severe left ventricular systolic dysfunction. LV end diastolic pressure is mildly elevated. The left ventricular ejection fraction is 25-35% by visual estimate. Successful proximal LAD PCI and drug-eluting stenting in the setting of an anterior STEMI   Admitted 07/17/22 due to ankle pain/ edema. Diuresed and treated for gout. Was in the ED 06/27/22 due to left foot pain.   He presents today for his initial visit with a chief complaint of   Review of Systems    Physical Exam     Assessment & Plan:  1: Chronic heart failure with mildly reduced ejection fraction- - NYHA class - GDMT of  - BNP 07/19/22 was 1577.8  2: HTN- - BP - saw PCP Cameron [name]) 07/03/22 - BMP 07/26/22 showed sodium 137, potassium 4.1, creatinine 1.10 & GFR >60  3: CAD- - saw cardiology Cameron [name]) 01/28/22 - LDL 02/18/20 was 52  4: Tobacco use-

## 2022-08-07 ENCOUNTER — Encounter: Payer: Self-pay | Admitting: Family

## 2022-08-07 ENCOUNTER — Telehealth: Payer: Self-pay | Admitting: Family

## 2022-08-07 NOTE — Telephone Encounter (Signed)
Patient did not show for his initial [facility] appointment on 08/07/22. Will attempt to reschedule.

## 2022-08-12 ENCOUNTER — Encounter: Payer: BLUE CROSS/BLUE SHIELD | Admitting: Family

## 2022-10-22 ENCOUNTER — Ambulatory Visit (INDEPENDENT_AMBULATORY_CARE_PROVIDER_SITE_OTHER): Payer: Self-pay | Admitting: Nurse Practitioner

## 2022-10-22 ENCOUNTER — Encounter (INDEPENDENT_AMBULATORY_CARE_PROVIDER_SITE_OTHER): Payer: Self-pay

## 2023-03-10 ENCOUNTER — Other Ambulatory Visit: Payer: Self-pay

## 2023-03-10 ENCOUNTER — Inpatient Hospital Stay: Payer: BLUE CROSS/BLUE SHIELD

## 2023-03-10 ENCOUNTER — Encounter: Payer: Self-pay | Admitting: Emergency Medicine

## 2023-03-10 ENCOUNTER — Inpatient Hospital Stay
Admission: EM | Admit: 2023-03-10 | Discharge: 2023-03-12 | DRG: 271 | Disposition: A | Discharge status: Home / Self Care | Payer: BLUE CROSS/BLUE SHIELD | Source: Ambulatory Visit | Attending: Osteopathic Medicine | Admitting: Osteopathic Medicine

## 2023-03-10 DIAGNOSIS — I10 Essential (primary) hypertension: Secondary | ICD-10-CM | POA: Diagnosis present

## 2023-03-10 DIAGNOSIS — N182 Chronic kidney disease, stage 2 (mild): Secondary | ICD-10-CM

## 2023-03-10 DIAGNOSIS — I5022 Chronic systolic (congestive) heart failure: Secondary | ICD-10-CM | POA: Diagnosis present

## 2023-03-10 DIAGNOSIS — F1290 Cannabis use, unspecified, uncomplicated: Secondary | ICD-10-CM | POA: Diagnosis not present

## 2023-03-10 DIAGNOSIS — R6889 Other general symptoms and signs: Secondary | ICD-10-CM

## 2023-03-10 DIAGNOSIS — Z7982 Long term (current) use of aspirin: Secondary | ICD-10-CM

## 2023-03-10 DIAGNOSIS — Z8679 Personal history of other diseases of the circulatory system: Secondary | ICD-10-CM | POA: Diagnosis not present

## 2023-03-10 DIAGNOSIS — Z7901 Long term (current) use of anticoagulants: Secondary | ICD-10-CM | POA: Diagnosis not present

## 2023-03-10 DIAGNOSIS — Z955 Presence of coronary angioplasty implant and graft: Secondary | ICD-10-CM | POA: Diagnosis not present

## 2023-03-10 DIAGNOSIS — I482 Chronic atrial fibrillation, unspecified: Secondary | ICD-10-CM | POA: Diagnosis not present

## 2023-03-10 DIAGNOSIS — I4891 Unspecified atrial fibrillation: Secondary | ICD-10-CM | POA: Diagnosis present

## 2023-03-10 DIAGNOSIS — E785 Hyperlipidemia, unspecified: Secondary | ICD-10-CM | POA: Diagnosis present

## 2023-03-10 DIAGNOSIS — Z820 Family history of epilepsy and other diseases of the nervous system: Secondary | ICD-10-CM | POA: Diagnosis not present

## 2023-03-10 DIAGNOSIS — I70222 Atherosclerosis of native arteries of extremities with rest pain, left leg: Principal | ICD-10-CM | POA: Diagnosis present

## 2023-03-10 DIAGNOSIS — Z8249 Family history of ischemic heart disease and other diseases of the circulatory system: Secondary | ICD-10-CM | POA: Diagnosis not present

## 2023-03-10 DIAGNOSIS — I5020 Unspecified systolic (congestive) heart failure: Secondary | ICD-10-CM

## 2023-03-10 DIAGNOSIS — F17210 Nicotine dependence, cigarettes, uncomplicated: Secondary | ICD-10-CM

## 2023-03-10 DIAGNOSIS — Z716 Tobacco abuse counseling: Secondary | ICD-10-CM

## 2023-03-10 DIAGNOSIS — Z803 Family history of malignant neoplasm of breast: Secondary | ICD-10-CM

## 2023-03-10 DIAGNOSIS — Z833 Family history of diabetes mellitus: Secondary | ICD-10-CM

## 2023-03-10 DIAGNOSIS — I130 Hypertensive heart and chronic kidney disease with heart failure and stage 1 through stage 4 chronic kidney disease, or unspecified chronic kidney disease: Secondary | ICD-10-CM | POA: Diagnosis present

## 2023-03-10 DIAGNOSIS — Z79899 Other long term (current) drug therapy: Secondary | ICD-10-CM

## 2023-03-10 DIAGNOSIS — M79672 Pain in left foot: Secondary | ICD-10-CM

## 2023-03-10 DIAGNOSIS — I743 Embolism and thrombosis of arteries of the lower extremities: Secondary | ICD-10-CM | POA: Diagnosis present

## 2023-03-10 DIAGNOSIS — I70201 Unspecified atherosclerosis of native arteries of extremities, right leg: Secondary | ICD-10-CM | POA: Diagnosis not present

## 2023-03-10 DIAGNOSIS — I2510 Atherosclerotic heart disease of native coronary artery without angina pectoris: Secondary | ICD-10-CM | POA: Diagnosis present

## 2023-03-10 DIAGNOSIS — I252 Old myocardial infarction: Secondary | ICD-10-CM

## 2023-03-10 DIAGNOSIS — Z7984 Long term (current) use of oral hypoglycemic drugs: Secondary | ICD-10-CM

## 2023-03-10 DIAGNOSIS — I998 Other disorder of circulatory system: Secondary | ICD-10-CM | POA: Diagnosis present

## 2023-03-10 DIAGNOSIS — Z720 Tobacco use: Secondary | ICD-10-CM | POA: Diagnosis present

## 2023-03-10 DIAGNOSIS — E876 Hypokalemia: Secondary | ICD-10-CM | POA: Diagnosis present

## 2023-03-10 DIAGNOSIS — M109 Gout, unspecified: Secondary | ICD-10-CM | POA: Diagnosis present

## 2023-03-10 DIAGNOSIS — T82856A Stenosis of peripheral vascular stent, initial encounter: Secondary | ICD-10-CM | POA: Diagnosis not present

## 2023-03-10 DIAGNOSIS — T82868A Thrombosis of vascular prosthetic devices, implants and grafts, initial encounter: Secondary | ICD-10-CM | POA: Diagnosis not present

## 2023-03-10 DIAGNOSIS — Z9889 Other specified postprocedural states: Secondary | ICD-10-CM | POA: Diagnosis not present

## 2023-03-10 LAB — COMPREHENSIVE METABOLIC PANEL
ALT: 14 U/L (ref 0–44)
AST: 24 U/L (ref 15–41)
Albumin: 4.3 g/dL (ref 3.5–5.0)
Alkaline Phosphatase: 91 U/L (ref 38–126)
Anion gap: 10 (ref 5–15)
BUN: 21 mg/dL (ref 8–23)
CO2: 26 mmol/L (ref 22–32)
Calcium: 8.9 mg/dL (ref 8.9–10.3)
Chloride: 102 mmol/L (ref 98–111)
Creatinine, Ser: 1.28 mg/dL — ABNORMAL HIGH (ref 0.61–1.24)
GFR, Estimated: >60 mL/min (ref >60)
Glucose, Bld: 96 mg/dL (ref 70–99)
Potassium: 2.5 mmol/L — CL (ref 3.5–5.1)
Sodium: 138 mmol/L (ref 135–145)
Total Bilirubin: 0.2 mg/dL — ABNORMAL LOW (ref 0.3–1.2)
Total Protein: 7.2 g/dL (ref 6.5–8.1)

## 2023-03-10 LAB — BASIC METABOLIC PANEL
Anion gap: 7 (ref 5–15)
BUN: 18 mg/dL (ref 8–23)
CO2: 29 mmol/L (ref 22–32)
Calcium: 8.6 mg/dL — ABNORMAL LOW (ref 8.9–10.3)
Chloride: 102 mmol/L (ref 98–111)
Creatinine, Ser: 1.27 mg/dL — ABNORMAL HIGH (ref 0.61–1.24)
GFR, Estimated: >60 mL/min (ref >60)
Glucose, Bld: 101 mg/dL — ABNORMAL HIGH (ref 70–99)
Potassium: 3.2 mmol/L — ABNORMAL LOW (ref 3.5–5.1)
Sodium: 138 mmol/L (ref 135–145)

## 2023-03-10 LAB — CBC
HCT: 38.8 % — ABNORMAL LOW (ref 39.0–52.0)
Hemoglobin: 11.9 g/dL — ABNORMAL LOW (ref 13.0–17.0)
MCH: 28.8 pg (ref 26.0–34.0)
MCHC: 30.7 g/dL (ref 30.0–36.0)
MCV: 93.9 fL (ref 80.0–100.0)
Platelets: 161 10*3/uL (ref 150–400)
RBC: 4.13 MIL/uL — ABNORMAL LOW (ref 4.22–5.81)
RDW: 16.6 % — ABNORMAL HIGH (ref 11.5–15.5)
WBC: 6.8 10*3/uL (ref 4.0–10.5)
nRBC: 0 % (ref 0.0–0.2)

## 2023-03-10 LAB — PROTIME-INR
INR: 1 (ref 0.8–1.2)
Prothrombin Time: 13.2 s (ref 11.4–15.2)

## 2023-03-10 LAB — APTT
aPTT: 31 s (ref 24–36)
aPTT: 74 s — ABNORMAL HIGH (ref 24–36)
aPTT: 74 s — ABNORMAL HIGH (ref 24–36)

## 2023-03-10 LAB — HEPARIN LEVEL (UNFRACTIONATED)
Heparin Unfractionated: 0.57 [IU]/mL (ref 0.30–0.70)
Heparin Unfractionated: 0.57 [IU]/mL (ref 0.30–0.70)
Heparin Unfractionated: 0.39 [IU]/mL (ref 0.30–0.70)

## 2023-03-10 LAB — MAGNESIUM: Magnesium: 1.8 mg/dL (ref 1.7–2.4)

## 2023-03-10 MED ORDER — POTASSIUM CHLORIDE CRYS ER 20 MEQ PO TBCR
40.0000 meq | EXTENDED_RELEASE_TABLET | ORAL | Status: AC
  Administered 2023-03-10 (×2): 40 meq via ORAL
  Filled 2023-03-10 (×2): qty 2

## 2023-03-10 MED ORDER — INFLUENZA VIRUS VACC SPLIT PF (FLUZONE) 0.5 ML IM SUSY: 0.5000 mL | PREFILLED_SYRINGE | INTRAMUSCULAR | Status: DC

## 2023-03-10 MED ORDER — METHYLPREDNISOLONE SODIUM SUCC 125 MG IJ SOLR: 125.0000 mg | Freq: Once | INTRAMUSCULAR | Status: DC | PRN

## 2023-03-10 MED ORDER — ACETAMINOPHEN 325 MG PO TABS: 650.0000 mg | ORAL_TABLET | Freq: Four times a day (QID) | ORAL | Status: DC | PRN

## 2023-03-10 MED ORDER — ONDANSETRON HCL 4 MG/2ML IJ SOLN
4.0000 mg | Freq: Once | INTRAMUSCULAR | Status: AC
  Administered 2023-03-10: 4 mg via INTRAVENOUS
  Filled 2023-03-10: qty 2

## 2023-03-10 MED ORDER — NITROGLYCERIN 0.4 MG SL SUBL: 0.4000 mg | SUBLINGUAL_TABLET | SUBLINGUAL | Status: DC | PRN

## 2023-03-10 MED ORDER — FAMOTIDINE 20 MG PO TABS: 40.0000 mg | ORAL_TABLET | Freq: Once | ORAL | Status: DC | PRN

## 2023-03-10 MED ORDER — NICOTINE 21 MG/24HR TD PT24
21.0000 mg | MEDICATED_PATCH | Freq: Every day | TRANSDERMAL | Status: DC
  Filled 2023-03-10 (×3): qty 1

## 2023-03-10 MED ORDER — MORPHINE SULFATE (PF) 4 MG/ML IV SOLN
4.0000 mg | INTRAVENOUS | Status: DC | PRN
  Administered 2023-03-10: 4 mg via INTRAVENOUS
  Filled 2023-03-10: qty 1

## 2023-03-10 MED ORDER — DIPHENHYDRAMINE HCL 50 MG/ML IJ SOLN: 50.0000 mg | Freq: Once | INTRAMUSCULAR | Status: DC | PRN

## 2023-03-10 MED ORDER — FENTANYL CITRATE PF 50 MCG/ML IJ SOSY: 12.5000 ug | PREFILLED_SYRINGE | Freq: Once | INTRAMUSCULAR | Status: DC | PRN

## 2023-03-10 MED ORDER — HYDROMORPHONE HCL 1 MG/ML IJ SOLN
0.5000 mg | INTRAMUSCULAR | Status: DC | PRN
  Administered 2023-03-10 – 2023-03-12 (×7): 0.5 mg via INTRAVENOUS
  Filled 2023-03-10 (×7): qty 0.5

## 2023-03-10 MED ORDER — MORPHINE SULFATE (PF) 4 MG/ML IV SOLN
4.0000 mg | Freq: Once | INTRAVENOUS | Status: AC
  Administered 2023-03-10: 4 mg via INTRAVENOUS
  Filled 2023-03-10: qty 1

## 2023-03-10 MED ORDER — ATORVASTATIN CALCIUM 20 MG PO TABS
80.0000 mg | ORAL_TABLET | Freq: Every day | ORAL | Status: DC
  Administered 2023-03-10 – 2023-03-12 (×3): 80 mg via ORAL
  Filled 2023-03-10 (×3): qty 4

## 2023-03-10 MED ORDER — COLCHICINE 0.6 MG PO TABS
0.6000 mg | ORAL_TABLET | Freq: Every day | ORAL | Status: DC
  Administered 2023-03-10 – 2023-03-12 (×3): 0.6 mg via ORAL
  Filled 2023-03-10 (×3): qty 1

## 2023-03-10 MED ORDER — FUROSEMIDE 40 MG PO TABS: 40.0000 mg | ORAL_TABLET | Freq: Two times a day (BID) | ORAL | Status: DC

## 2023-03-10 MED ORDER — HEPARIN BOLUS VIA INFUSION: 2000.0000 [IU] | Freq: Once | INTRAVENOUS | Status: DC

## 2023-03-10 MED ORDER — HEPARIN (PORCINE) 25000 UT/250ML-% IV SOLN
1200.0000 [IU]/h | INTRAVENOUS | Status: DC
  Administered 2023-03-10 – 2023-03-11 (×2): 1200 [IU]/h via INTRAVENOUS
  Filled 2023-03-10 (×2): qty 250

## 2023-03-10 MED ORDER — PANTOPRAZOLE SODIUM 40 MG PO TBEC
40.0000 mg | DELAYED_RELEASE_TABLET | Freq: Every day | ORAL | Status: DC
  Administered 2023-03-10 – 2023-03-12 (×3): 40 mg via ORAL
  Filled 2023-03-10 (×3): qty 1

## 2023-03-10 MED ORDER — ALLOPURINOL 100 MG PO TABS
100.0000 mg | ORAL_TABLET | Freq: Every day | ORAL | Status: DC
  Administered 2023-03-10 – 2023-03-12 (×3): 100 mg via ORAL
  Filled 2023-03-10 (×4): qty 1

## 2023-03-10 MED ORDER — SPIRONOLACTONE 12.5 MG HALF TABLET
12.5000 mg | ORAL_TABLET | Freq: Every day | ORAL | Status: DC
  Administered 2023-03-11 – 2023-03-12 (×2): 12.5 mg via ORAL
  Filled 2023-03-10 (×3): qty 1

## 2023-03-10 MED ORDER — POTASSIUM CHLORIDE CRYS ER 20 MEQ PO TBCR
40.0000 meq | EXTENDED_RELEASE_TABLET | Freq: Once | ORAL | Status: AC
  Administered 2023-03-10: 40 meq via ORAL

## 2023-03-10 MED ORDER — SODIUM CHLORIDE 0.9 % IV BOLUS
500.0000 mL | Freq: Once | INTRAVENOUS | Status: AC
  Administered 2023-03-10: 500 mL via INTRAVENOUS

## 2023-03-10 MED ORDER — MIDAZOLAM HCL 2 MG/ML PO SYRP: 8.0000 mg | ORAL_SOLUTION | Freq: Once | ORAL | Status: DC | PRN

## 2023-03-10 MED ORDER — ONDANSETRON HCL 4 MG/2ML IJ SOLN: 4.0000 mg | Freq: Four times a day (QID) | INTRAMUSCULAR | Status: DC | PRN

## 2023-03-10 MED ORDER — METOPROLOL SUCCINATE ER 25 MG PO TB24
12.5000 mg | ORAL_TABLET | Freq: Every day | ORAL | Status: DC
  Administered 2023-03-11: 12.5 mg via ORAL
  Filled 2023-03-10 (×2): qty 1

## 2023-03-10 MED ORDER — DAPAGLIFLOZIN PROPANEDIOL 10 MG PO TABS
10.0000 mg | ORAL_TABLET | Freq: Every day | ORAL | Status: DC
  Administered 2023-03-11 – 2023-03-12 (×2): 10 mg via ORAL
  Filled 2023-03-10 (×3): qty 1

## 2023-03-10 MED ORDER — HYDROMORPHONE HCL 1 MG/ML IJ SOLN: 1.0000 mg | Freq: Once | INTRAMUSCULAR | Status: DC | PRN

## 2023-03-10 MED ORDER — SODIUM CHLORIDE 0.9 % IV SOLN: INTRAVENOUS | Status: DC

## 2023-03-10 MED ORDER — CEFAZOLIN SODIUM-DEXTROSE 2-4 GM/100ML-% IV SOLN
2.0000 g | INTRAVENOUS | Status: AC
  Administered 2023-03-11: 2 g via INTRAVENOUS

## 2023-03-10 NOTE — Assessment & Plan Note (Signed)
BP stable Titrate home regimen 

## 2023-03-10 NOTE — Assessment & Plan Note (Signed)
Worsening left lower extremity foot pain burning over 1 to 2 weeks with concern for limb ischemia Noted prior history of left lower extremity peripheral artery stenting in 2021 Case reviewed with Dr. Wyn Quaker plans for angiography Heparin drip Pain control Follow-up formal vascular surgery recommendations

## 2023-03-10 NOTE — Assessment & Plan Note (Addendum)
2D echo January 2024 with a EF of 40 to 45% and grade 2 diastolic dysfunction Appears fairly euvolemic Monitor volume status Cont home regimen including metoprolol, aldactone  Hold lasix in setting of hypokalemia for now

## 2023-03-10 NOTE — Consult Note (Signed)
Pharmacy Consult Note - Anticoagulation  Pharmacy Consult for heparin Indication:  PAD  PATIENT MEASUREMENTS: Height: 5\' 7"  (170.2 cm) Weight: 82.6 kg (182 lb) IBW/kg (Calculated) : 66.1 HEPARIN DW (KG): 82.6  VITAL SIGNS: Temp: 97.2 F (36.2 C) (09/03 1616) Temp Source: Axillary (09/03 1200) BP: 98/55 (09/03 1616) Pulse Rate: 57 (09/03 1616)  Recent Labs    03/10/23 0916 03/10/23 1651  HGB 11.9*  --   HCT 38.8*  --   PLT 161  --   APTT 31 74*  LABPROT 13.2  --   INR 1.0  --   HEPARINUNFRC 0.57 0.57  CREATININE 1.28*  --     Estimated Creatinine Clearance: 62.3 mL/min (A) (by C-G formula based on SCr of 1.28 mg/dL (H)).  PAST MEDICAL HISTORY: Past Medical History:  Diagnosis Date   CHF (congestive heart failure) (HCC)    Coronary artery disease    a. 12/17 PCI with DES to pLAD with resdiual disease (RX therapy)   Hyperlipidemia    Hypertension    PAD (peripheral artery disease) (HCC)    STEMI (ST elevation myocardial infarction) (HCC) 2017    ASSESSMENT: 62 y.o. male with PMH PAD s/p revasc in 2021 and possible left AKA or BKA, CAD, HLD, HFmrEF (EF 40-45%), gout  is presenting with ischemic leg pain, endorsing redness and swelling. ABI performed on both sides, yielding 0.5-0.9 range, consistent with moderate PAD. Vascular team has been consulted and they are planning to operate on patient tomorrow, 9/4.   Patient is on chronic anticoagulation (apixaban) per chart review. Last dose of apixaban was 9/2 at 2300. Pharmacy has been consulted to initiate and manage heparin intravenous infusion.  Pertinent medications: Apixaban 5 mg PO BID (last dose 9/2 @ 2300)  Goal(s) of therapy: Heparin level 0.3 - 0.7 units/mL aPTT 66 - 102 seconds Monitor platelets by anticoagulation protocol: Yes   Baseline anticoagulation labs: Recent Labs    03/10/23 0916 03/10/23 1651  APTT 31 74*  INR 1.0  --   HGB 11.9*  --   PLT 161  --      Date Time aPTT//HL Rate/Comment 9/3 1651 74 s//0.57 Therapeutic and correlating  PLAN: Continue heparin infusion at 1200 units/hour Check confirmation anti-Xa level in 6 hours Monitor by anti-Xa level going forward given that there is not false elevation from PTA apixaban Continue to monitor CBC daily while on heparin infusion.  Will M. Dareen Piano, PharmD Clinical Pharmacist 03/10/2023 5:43 PM

## 2023-03-10 NOTE — Assessment & Plan Note (Signed)
Baseline CAD with NSTEMI and complex stenting 2006 and anterior STEMI/06/2016-s/p PCI with DES LAD  No active CP  Cont home regimen including statin

## 2023-03-10 NOTE — Assessment & Plan Note (Signed)
Stable  Cont allopurinol and colchicine

## 2023-03-10 NOTE — Assessment & Plan Note (Signed)
?   Statin.

## 2023-03-10 NOTE — Assessment & Plan Note (Addendum)
Potassium 2.5 Check mag level Replete Hold offending agents

## 2023-03-10 NOTE — H&P (Addendum)
History and Physical    Patient: Cameron [name] UJW:[id number] DOB: 16-Feb-1961 DOA: 03/10/2023 DOS: the patient was seen and examined on 03/10/2023 PCP: Pcp, No  Patient coming from: Home  Chief Complaint:  Chief Complaint  Patient presents with   Foot Pain   HPI: Cameron [name] is a 62 y.o. male with medical history significant of CAD, HFrEF, hypertension hyperlipidemia, PAD,gout presenting w/ limb ischemia. Pt reports worsening LLE pain over past 1-2 weeks. History of peripheral artery disease status post stenting of left lower extremity in 2021.  Patient reports overall stable disease since stenting.  On Eliquis in the setting of atrial fibrillation per report.  Patient does report missing some doses at times.  +1 pack/day smoker.  No chest pain or shortness of breath.  No nausea or vomiting.  Has had worsening burning pain as well as cool toes.  Pain is worse with ambulation and has not become present at rest. Presented to the ER afebrile, hemodynamic stable.  White count 6.8, hemoglobin 11.9, platelets 161, creatinine 1.28, potassium 2.5. ABIs pending.  Review of Systems: As mentioned in the history of present illness. All other systems reviewed and are negative. Past Medical History:  Diagnosis Date   CHF (congestive heart failure) (HCC)    Coronary artery disease    a. 12/17 PCI with DES to pLAD with resdiual disease (RX therapy)   Hyperlipidemia    Hypertension    PAD (peripheral artery disease) (HCC)    STEMI (ST elevation myocardial infarction) (HCC) 2017   Past Surgical History:  Procedure Laterality Date   CARDIAC CATHETERIZATION N/A 06/17/2016   Procedure: Left Heart Cath and Coronary Angiography;  Surgeon: Runell Gess, MD;  Location: [facility];  Service: Cardiovascular;  Laterality: N/A;   CARDIAC CATHETERIZATION N/A 06/17/2016   Procedure: Coronary Stent Intervention;  Surgeon: Runell Gess, MD;  Location: [facility];  Service:  Cardiovascular;  Laterality: N/A;   cardiac stents     CERVICAL SPINE SURGERY     LOWER EXTREMITY ANGIOGRAPHY Left 06/25/2020   Procedure: LOWER EXTREMITY ANGIOGRAPHY;  Surgeon: Annice Needy, MD;  Location: [facility] INVASIVE CV LAB;  Service: Cardiovascular;  Laterality: Left;   Social History:  reports that he has been smoking cigarettes. He has never used smokeless tobacco. He reports that he does not currently use alcohol. He reports current drug use. Frequency: 1.00 time per week. Drug: Marijuana.  No Known Allergies  Family History  Problem Relation Age of Onset   CAD Paternal Grandfather    Breast cancer Mother    Parkinson's disease Father    Alzheimer's disease Father    Hypertension Father    Diabetes Father     Prior to Admission medications   Medication Sig Start Date End Date Taking? Authorizing Provider  acetaminophen (TYLENOL) 500 MG tablet Take 500 mg by mouth daily.   Yes [provider]  allopurinol (ZYLOPRIM) 100 MG tablet Take 100 mg by mouth daily. 06/23/22 06/23/23 Yes [provider]  atorvastatin (LIPITOR) 80 MG tablet Take 1 tablet (80 mg total) by mouth daily. Patient taking differently: Take 40 mg by mouth daily. 02/21/20  Yes Charise Killian, MD  colchicine 0.6 MG tablet Take 1 tablet (0.6 mg total) by mouth daily. 07/27/22  Yes Wieting, Richard, MD  ELIQUIS 5 MG TABS tablet Take 1 tablet by mouth 2 (two) times daily. 12/22/22  Yes [provider]  FARXIGA 10 MG TABS tablet Take 10 mg  by mouth daily.   Yes [provider]  furosemide (LASIX) 40 MG tablet Take 1 tablet (40 mg total) by mouth 2 (two) times daily. 07/26/22  Yes Wieting, Richard, MD  hydrocortisone (ANUSOL-HC) 25 MG suppository One suppository twice a day as needed for hemorrhoids.  (Generic okay to substitute) 07/26/22  Yes Wieting, Richard, MD  ibuprofen (ADVIL) 200 MG tablet Take 200 mg by mouth every 6 (six) hours as needed for headache or mild pain.   Yes  [provider]  metoprolol succinate (TOPROL-XL) 25 MG 24 hr tablet Take 0.5 tablets (12.5 mg total) by mouth at bedtime. 07/26/22  Yes Wieting, Richard, MD  nitroGLYCERIN (NITROSTAT) 0.4 MG SL tablet Place under the tongue. 06/20/16  Yes [provider]  nicotine (NICODERM CQ - DOSED IN MG/24 HOURS) 14 mg/24hr patch One 14 mg patch chest wall daily as needed for nicotine craving.  (Can substitute generic) Patient not taking: Reported on 03/10/2023 07/26/22   Alford Highland, MD  pantoprazole (PROTONIX) 40 MG tablet Take 1 tablet (40 mg total) by mouth daily. 07/26/22 08/25/22  Alford Highland, MD  spironolactone (ALDACTONE) 25 MG tablet Take 1 tablet (25 mg total) by mouth 2 (two) times daily. Patient not taking: Reported on 03/10/2023 04/11/21   Sterling Big F, MD  isosorbide mononitrate (IMDUR) 60 MG 24 hr tablet Take 1 tablet (60 mg total) by mouth daily. 02/04/18 05/30/19  [name], MD    Physical Exam: Vitals:   03/10/23 0856 03/10/23 0857  BP: 122/71   Pulse: 73   Resp: 18   Temp: 97.7 F (36.5 C)   TempSrc: Oral   SpO2: 98%   Weight:  82.6 kg  Height:  5\' 7"  (1.702 m)   Physical Exam Constitutional:      Appearance: He is normal weight.  HENT:     Head: Normocephalic.     Nose: Nose normal.     Mouth/Throat:     Mouth: Mucous membranes are moist.  Cardiovascular:     Rate and Rhythm: Normal rate and regular rhythm.  Pulmonary:     Effort: Pulmonary effort is normal.  Abdominal:     General: Bowel sounds are normal.  Musculoskeletal:        General: Normal range of motion.     Cervical back: Normal range of motion.     Comments: + cool toes diffusely in LLE   Skin:    Comments: Mild to moderate discoloration on toes in LLE   Neurological:     General: No focal deficit present.     Data Reviewed:  There are no new results to review at this time. Lab Results  Component Value Date   WBC 6.8 03/10/2023   HGB 11.9 (L) 03/10/2023   HCT 38.8 (L)  03/10/2023   MCV 93.9 03/10/2023   PLT 161 03/10/2023   Last metabolic panel Lab Results  Component Value Date   GLUCOSE 96 03/10/2023   NA 138 03/10/2023   K 2.5 (LL) 03/10/2023   CL 102 03/10/2023   CO2 26 03/10/2023   BUN 21 03/10/2023   CREATININE 1.28 (H) 03/10/2023   GFRNONAA >60 03/10/2023   CALCIUM 8.9 03/10/2023   PHOS 5.3 (H) 04/04/2021   PROT 7.2 03/10/2023   ALBUMIN 4.3 03/10/2023   BILITOT 0.2 (L) 03/10/2023   ALKPHOS 91 03/10/2023   AST 24 03/10/2023   ALT 14 03/10/2023   ANIONGAP 10 03/10/2023    Assessment and Plan: * Limb ischemia Worsening  left lower extremity foot pain burning over 1 to 2 weeks with concern for limb ischemia Noted prior history of left lower extremity peripheral artery stenting in 2021 Case reviewed with Dr. Wyn Quaker plans for angiography Heparin drip Pain control Follow-up formal vascular surgery recommendations  HFrEF (heart failure with reduced ejection fraction) (HCC) 2D echo January 2024 with a EF of 40 to 45% and grade 2 diastolic dysfunction Appears fairly euvolemic Monitor volume status Cont home regimen including metoprolol, aldactone  Hold lasix in setting of hypokalemia for now   CKD (chronic kidney disease), stage II Creatinine 1.7 with GFR greater than 60 Monitor  Gout_left elbow Stable  Cont allopurinol and colchicine   Tobacco abuse 1 pack/day smoker Discussed cessation at length Nicotine patch  New onset atrial fibrillation (HCC) Rate controlled at present Continue home regimen including beta-blocker On heparin drip in the setting of limb ischemia   Hypokalemia Potassium 2.5 Check mag level Replete Hold offending agents     CAD (coronary artery disease), native coronary artery Baseline CAD with NSTEMI and complex stenting 2006 and anterior STEMI/06/2016-s/p PCI with DES LAD  No active CP  Cont home regimen including statin   Hyperlipidemia Statin  Essential hypertension BP stable Titrate home  regimen      Advance Care Planning:   Code Status: Prior   Consults: Vascular surgery   Family Communication: No family at the bedside   Severity of Illness: The appropriate patient status for this patient is INPATIENT. Inpatient status is judged to be reasonable and necessary in order to provide the required intensity of service to ensure the patient's safety. The patient's presenting symptoms, physical exam findings, and initial radiographic and laboratory data in the context of their chronic comorbidities is felt to place them at high risk for further clinical deterioration. Furthermore, it is not anticipated that the patient will be medically stable for discharge from the hospital within 2 midnights of admission.   * I certify that at the point of admission it is my clinical judgment that the patient will require inpatient hospital care spanning beyond 2 midnights from the point of admission due to high intensity of service, high risk for further deterioration and high frequency of surveillance required.*  Author: Floydene Flock, MD 03/10/2023 11:15 AM  For on call review [email or web address].

## 2023-03-10 NOTE — H&P (View-Only) (Signed)
Hospital Consult    Reason for Consult:  Left Lower Extremity Ischemia.  Requesting Physician:  Dr Jene Every MD MRN #:  [id number]  History of Present Illness: This is a 61 y.o. male with history of CHF CAD hyperlipidemia peripheral arterial disease status post revascularization of left lower extremity in 2021 per review of records who presents with complaints of increasing foot pain, sensation of cold toes worsening over the last couple of weeks.   Past Medical History:  Diagnosis Date   CHF (congestive heart failure) (HCC)    Coronary artery disease    a. 12/17 PCI with DES to pLAD with resdiual disease (RX therapy)   Hyperlipidemia    Hypertension    PAD (peripheral artery disease) (HCC)    STEMI (ST elevation myocardial infarction) (HCC) 2017    Past Surgical History:  Procedure Laterality Date   CARDIAC CATHETERIZATION N/A 06/17/2016   Procedure: Left Heart Cath and Coronary Angiography;  Surgeon: Runell Gess, MD;  Location: [facility];  Service: Cardiovascular;  Laterality: N/A;   CARDIAC CATHETERIZATION N/A 06/17/2016   Procedure: Coronary Stent Intervention;  Surgeon: Runell Gess, MD;  Location: [facility];  Service: Cardiovascular;  Laterality: N/A;   cardiac stents     CERVICAL SPINE SURGERY     LOWER EXTREMITY ANGIOGRAPHY Left 06/25/2020   Procedure: LOWER EXTREMITY ANGIOGRAPHY;  Surgeon: Annice Needy, MD;  Location: [facility] INVASIVE CV LAB;  Service: Cardiovascular;  Laterality: Left;    No Known Allergies  Prior to Admission medications   Medication Sig Start Date End Date Taking? Authorizing Provider  acetaminophen (TYLENOL) 500 MG tablet Take 500 mg by mouth daily.    [provider]  allopurinol (ZYLOPRIM) 100 MG tablet Take 100 mg by mouth daily. 06/23/22 06/23/23  [provider]  atorvastatin (LIPITOR) 80 MG tablet Take 1 tablet (80 mg total) by mouth daily. 02/21/20   Charise Killian, MD  colchicine 0.6 MG  tablet Take 1 tablet (0.6 mg total) by mouth daily. 07/27/22   Wieting, Richard, MD  FARXIGA 10 MG TABS tablet Take 10 mg by mouth daily.    [provider]  furosemide (LASIX) 40 MG tablet Take 1 tablet (40 mg total) by mouth 2 (two) times daily. 07/26/22   Alford Highland, MD  hydrocortisone (ANUSOL-HC) 25 MG suppository One suppository twice a day as needed for hemorrhoids.  (Generic okay to substitute) 07/26/22   Alford Highland, MD  metoprolol succinate (TOPROL-XL) 25 MG 24 hr tablet Take 0.5 tablets (12.5 mg total) by mouth at bedtime. 07/26/22   Alford Highland, MD  nicotine (NICODERM CQ - DOSED IN MG/24 HOURS) 14 mg/24hr patch One 14 mg patch chest wall daily as needed for nicotine craving.  (Can substitute generic) 07/26/22   Alford Highland, MD  nitroGLYCERIN (NITROSTAT) 0.4 MG SL tablet Place under the tongue. 06/20/16   [provider]  pantoprazole (PROTONIX) 40 MG tablet Take 1 tablet (40 mg total) by mouth daily. 07/26/22 08/25/22  Alford Highland, MD  spironolactone (ALDACTONE) 25 MG tablet Take 1 tablet (25 mg total) by mouth 2 (two) times daily. 04/11/21   Pabon, Diego F, MD  isosorbide mononitrate (IMDUR) 60 MG 24 hr tablet Take 1 tablet (60 mg total) by mouth daily. 02/04/18 05/30/19  [name], MD    Social History   Socioeconomic History   Marital status: Divorced    Spouse name: Not on file   Number of children: Not on file  Years of education: Not on file   Highest education level: Not on file  Occupational History   Not on file  Tobacco Use   Smoking status: Every Day    Current packs/day: 1.00    Types: Cigarettes   Smokeless tobacco: Never  Vaping Use   Vaping status: Never Used  Substance and Sexual Activity   Alcohol use: Not Currently    Comment: weekends   Drug use: Yes    Frequency: 1.0 times per week    Types: Marijuana    Comment: twice monthly   Sexual activity: Not Currently  Other Topics Concern   Not on file  Social History  Narrative   Not on file   Social Determinants of Health   Financial Resource Strain: Not on file  Food Insecurity: No Food Insecurity (07/18/2022)   Hunger Vital Sign    Worried About Running Out of Food in the Last Year: Never true    Ran Out of Food in the Last Year: Never true  Transportation Needs: No Transportation Needs (07/18/2022)   PRAPARE - Administrator, Civil Service (Medical): No    Lack of Transportation (Non-Medical): No  Physical Activity: Not on file  Stress: Not on file  Social Connections: Not on file  Intimate Partner Violence: Not At Risk (07/18/2022)   Humiliation, Afraid, Rape, and Kick questionnaire    Fear of Current or Ex-Partner: No    Emotionally Abused: No    Physically Abused: No    Sexually Abused: No     Family History  Problem Relation Age of Onset   CAD Paternal Grandfather    Breast cancer Mother    Parkinson's disease Father    Alzheimer's disease Father    Hypertension Father    Diabetes Father     ROS: Otherwise negative unless mentioned in HPI  Physical Examination  Vitals:   03/10/23 0856  BP: 122/71  Pulse: 73  Resp: 18  Temp: 97.7 F (36.5 C)  SpO2: 98%   Body mass index is 28.51 kg/m.  General:  WDWN in NAD Gait: Not observed HENT: WNL, normocephalic Pulmonary: normal non-labored breathing, without Rales, rhonchi,  wheezing Cardiac: regular, without  Murmurs, rubs or gallops; without carotid bruits Abdomen: Positive bowel sounds, soft, NT/ND, no masses Skin: without rashes Vascular Exam/Pulses: right lower extremity warm with palpable pulses. Left lower extremity is cool to touch at the foot and toes are purple in color with no capillary refill. Unable to obtain doppler PT or DP pulses.  Extremities: with ischemic changes, without Gangrene , with cellulitis; without open wounds;  Musculoskeletal: no muscle wasting or atrophy  Neurologic: A&O X 3;  No focal weakness or paresthesias are detected; speech is  fluent/normal Psychiatric:  The pt has Normal affect. Lymph:  Unremarkable  CBC    Component Value Date/Time   WBC 14.4 (H) 07/23/2022 0423   RBC 3.96 (L) 07/23/2022 0423   HGB 12.0 (L) 07/26/2022 0747   HGB 10.6 (L) 03/26/2014 1232   HCT 36.6 (L) 07/23/2022 0423   HCT 32.1 (L) 03/26/2014 1232   PLT 430 (H) 07/23/2022 0423   PLT 137 (L) 03/26/2014 1232   MCV 92.4 07/23/2022 0423   MCV 106 (H) 03/26/2014 1232   MCH 28.0 07/23/2022 0423   MCHC 30.3 07/23/2022 0423   RDW 18.6 (H) 07/23/2022 0423   RDW 15.1 (H) 03/26/2014 1232   LYMPHSABS 0.9 08/17/2020 1837   LYMPHSABS 0.4 (L) 03/26/2014 1232   MONOABS  0.5 08/17/2020 1837   MONOABS 0.3 03/26/2014 1232   EOSABS 0.1 08/17/2020 1837   EOSABS 0.0 03/26/2014 1232   BASOSABS 0.1 08/17/2020 1837   BASOSABS 0.0 03/26/2014 1232    BMET    Component Value Date/Time   NA 137 07/26/2022 0747   NA 139 03/28/2014 0823   K 4.1 07/26/2022 0747   K 3.4 (L) 03/28/2014 0823   CL 102 07/26/2022 0747   CL 111 (H) 03/28/2014 0823   CO2 26 07/26/2022 0747   CO2 22 03/28/2014 0823   GLUCOSE 95 07/26/2022 0747   GLUCOSE 106 (H) 03/28/2014 0823   BUN 34 (H) 07/26/2022 0747   BUN 8 03/28/2014 0823   CREATININE 1.10 07/26/2022 0747   CREATININE 0.97 03/28/2014 0823   CALCIUM 8.6 (L) 07/26/2022 0747   CALCIUM 7.8 (L) 03/28/2014 0823   GFRNONAA >60 07/26/2022 0747   GFRNONAA >60 03/28/2014 0823   GFRAA >60 02/21/2020 0630   GFRAA >60 03/28/2014 0823    COAGS: Lab Results  Component Value Date   INR 1.1 05/29/2021   INR 1.6 (H) 04/11/2021   INR 1.4 (H) 04/10/2021     Non-Invasive Vascular Imaging:   Ultra Sound with ABI's ordered.   Statin:  Yes.   Beta Blocker:  Yes.   Aspirin:  No. ACEI:  No. ARB:  No. CCB use:  No Other antiplatelets/anticoagulants:  No.    ASSESSMENT/PLAN: This is a 62 y.o. male who presents to [facility] emergency department with complaints of increasing left foot pain and cold toes worsening over the last  month. No open sores or gangrene to note.   Vascular Surgery plans on taking the patient to the vascular lab tomorrow 03/11/23 for left lower extremity angiogram with possible intervention. I discussed in detail with the patient the procedure, benefits, risks and complications. He verbalizes his understanding and would like to proceed. I answered all the patients questions today. Patient will be made NPO after midnight tonight for procedure tomorrow.    -I discussed in detail the plan with Dr Festus Barren MD and he agrees with the plan.    Marcie Bal [facility] 03/10/2023 9:20 AM

## 2023-03-10 NOTE — ED Notes (Signed)
Pt went to u/s

## 2023-03-10 NOTE — ED Notes (Signed)
Pt to ultrasound then to room 30. U/S requested to start the heparin drip when they are finished, which will take about 20 minutes.

## 2023-03-10 NOTE — Assessment & Plan Note (Signed)
Rate controlled at present Continue home regimen including beta-blocker On heparin drip in the setting of limb ischemia

## 2023-03-10 NOTE — ED Notes (Signed)
Pt has ready bed upstairs so will not go to cpod. U/S will bring pt back to room 41.

## 2023-03-10 NOTE — Consult Note (Signed)
Pharmacy Consult Note - Anticoagulation  Pharmacy Consult for heparin Indication:  PAD  PATIENT MEASUREMENTS: Height: 5\' 7"  (170.2 cm) Weight: 82.6 kg (182 lb) IBW/kg (Calculated) : 66.1 HEPARIN DW (KG): 82.6  VITAL SIGNS: Temp: 98 F (36.7 C) (09/03 1132) Temp Source: Oral (09/03 1132) BP: 120/70 (09/03 1132) Pulse Rate: 70 (09/03 1132)  Recent Labs    03/10/23 0916  HGB 11.9*  HCT 38.8*  PLT 161  APTT 31  LABPROT 13.2  INR 1.0  HEPARINUNFRC 0.57  CREATININE 1.28*    Estimated Creatinine Clearance: 62.3 mL/min (A) (by C-G formula based on SCr of 1.28 mg/dL (H)).  PAST MEDICAL HISTORY: Past Medical History:  Diagnosis Date   CHF (congestive heart failure) (HCC)    Coronary artery disease    a. 12/17 PCI with DES to pLAD with resdiual disease (RX therapy)   Hyperlipidemia    Hypertension    PAD (peripheral artery disease) (HCC)    STEMI (ST elevation myocardial infarction) (HCC) 2017    ASSESSMENT: 62 y.o. male with PMH PAD s/p revasc in 2021 and possible left AKA or BKA, CAD, HLD, HFmrEF (EF 40-45%), gout  is presenting with ischemic leg pain, endorsing redness and swelling. ABI performed on both sides, yielding 0.5-0.9 range, consistent with moderate PAD. Vascular team has been consulted and they are planning to operate on patient tomorrow, 9/4.   Patient is on chronic anticoagulation (apixaban) per chart review. Last dose of apixaban was 9/2 at 2300. Pharmacy has been consulted to initiate and manage heparin intravenous infusion.  Pertinent medications: Apixaban 5 mg PO BID (last dose 9/2 @ 2300)  Goal(s) of therapy: Heparin level 0.3 - 0.7 units/mL aPTT 66 - 102 seconds Monitor platelets by anticoagulation protocol: Yes   Baseline anticoagulation labs: Recent Labs    03/10/23 0916  APTT 31  INR 1.0  HGB 11.9*  PLT 161    Date Time aPTT/HL Rate/Comment    PLAN: Start heparin infusion at 1200 units/hour, with no bolus. Check aPTT in 6  hours. Continue to titrate by aPTT until heparin level and aPTT correlate and/or apixaban washes out, then titrate by heparin level alone. Check heparin level with next AM labs. Continue to monitor CBC daily while on heparin infusion.  Will M. Dareen Piano, PharmD Clinical Pharmacist 03/10/2023 11:44 AM

## 2023-03-10 NOTE — Consult Note (Signed)
Hospital Consult    Reason for Consult:  Left Lower Extremity Ischemia.  Requesting Physician:  Dr Jene Every MD MRN #:  [id number]  History of Present Illness: This is a 61 y.o. male with history of CHF CAD hyperlipidemia peripheral arterial disease status post revascularization of left lower extremity in 2021 per review of records who presents with complaints of increasing foot pain, sensation of cold toes worsening over the last couple of weeks.   Past Medical History:  Diagnosis Date   CHF (congestive heart failure) (HCC)    Coronary artery disease    a. 12/17 PCI with DES to pLAD with resdiual disease (RX therapy)   Hyperlipidemia    Hypertension    PAD (peripheral artery disease) (HCC)    STEMI (ST elevation myocardial infarction) (HCC) 2017    Past Surgical History:  Procedure Laterality Date   CARDIAC CATHETERIZATION N/A 06/17/2016   Procedure: Left Heart Cath and Coronary Angiography;  Surgeon: Runell Gess, MD;  Location: [facility];  Service: Cardiovascular;  Laterality: N/A;   CARDIAC CATHETERIZATION N/A 06/17/2016   Procedure: Coronary Stent Intervention;  Surgeon: Runell Gess, MD;  Location: [facility];  Service: Cardiovascular;  Laterality: N/A;   cardiac stents     CERVICAL SPINE SURGERY     LOWER EXTREMITY ANGIOGRAPHY Left 06/25/2020   Procedure: LOWER EXTREMITY ANGIOGRAPHY;  Surgeon: Annice Needy, MD;  Location: [facility] INVASIVE CV LAB;  Service: Cardiovascular;  Laterality: Left;    No Known Allergies  Prior to Admission medications   Medication Sig Start Date End Date Taking? Authorizing Provider  acetaminophen (TYLENOL) 500 MG tablet Take 500 mg by mouth daily.    [provider]  allopurinol (ZYLOPRIM) 100 MG tablet Take 100 mg by mouth daily. 06/23/22 06/23/23  [provider]  atorvastatin (LIPITOR) 80 MG tablet Take 1 tablet (80 mg total) by mouth daily. 02/21/20   Charise Killian, MD  colchicine 0.6 MG  tablet Take 1 tablet (0.6 mg total) by mouth daily. 07/27/22   Wieting, Richard, MD  FARXIGA 10 MG TABS tablet Take 10 mg by mouth daily.    [provider]  furosemide (LASIX) 40 MG tablet Take 1 tablet (40 mg total) by mouth 2 (two) times daily. 07/26/22   Alford Highland, MD  hydrocortisone (ANUSOL-HC) 25 MG suppository One suppository twice a day as needed for hemorrhoids.  (Generic okay to substitute) 07/26/22   Alford Highland, MD  metoprolol succinate (TOPROL-XL) 25 MG 24 hr tablet Take 0.5 tablets (12.5 mg total) by mouth at bedtime. 07/26/22   Alford Highland, MD  nicotine (NICODERM CQ - DOSED IN MG/24 HOURS) 14 mg/24hr patch One 14 mg patch chest wall daily as needed for nicotine craving.  (Can substitute generic) 07/26/22   Alford Highland, MD  nitroGLYCERIN (NITROSTAT) 0.4 MG SL tablet Place under the tongue. 06/20/16   [provider]  pantoprazole (PROTONIX) 40 MG tablet Take 1 tablet (40 mg total) by mouth daily. 07/26/22 08/25/22  Alford Highland, MD  spironolactone (ALDACTONE) 25 MG tablet Take 1 tablet (25 mg total) by mouth 2 (two) times daily. 04/11/21   Pabon, Diego F, MD  isosorbide mononitrate (IMDUR) 60 MG 24 hr tablet Take 1 tablet (60 mg total) by mouth daily. 02/04/18 05/30/19  [name], MD    Social History   Socioeconomic History   Marital status: Divorced    Spouse name: Not on file   Number of children: Not on file  Years of education: Not on file   Highest education level: Not on file  Occupational History   Not on file  Tobacco Use   Smoking status: Every Day    Current packs/day: 1.00    Types: Cigarettes   Smokeless tobacco: Never  Vaping Use   Vaping status: Never Used  Substance and Sexual Activity   Alcohol use: Not Currently    Comment: weekends   Drug use: Yes    Frequency: 1.0 times per week    Types: Marijuana    Comment: twice monthly   Sexual activity: Not Currently  Other Topics Concern   Not on file  Social History  Narrative   Not on file   Social Determinants of Health   Financial Resource Strain: Not on file  Food Insecurity: No Food Insecurity (07/18/2022)   Hunger Vital Sign    Worried About Running Out of Food in the Last Year: Never true    Ran Out of Food in the Last Year: Never true  Transportation Needs: No Transportation Needs (07/18/2022)   PRAPARE - Administrator, Civil Service (Medical): No    Lack of Transportation (Non-Medical): No  Physical Activity: Not on file  Stress: Not on file  Social Connections: Not on file  Intimate Partner Violence: Not At Risk (07/18/2022)   Humiliation, Afraid, Rape, and Kick questionnaire    Fear of Current or Ex-Partner: No    Emotionally Abused: No    Physically Abused: No    Sexually Abused: No     Family History  Problem Relation Age of Onset   CAD Paternal Grandfather    Breast cancer Mother    Parkinson's disease Father    Alzheimer's disease Father    Hypertension Father    Diabetes Father     ROS: Otherwise negative unless mentioned in HPI  Physical Examination  Vitals:   03/10/23 0856  BP: 122/71  Pulse: 73  Resp: 18  Temp: 97.7 F (36.5 C)  SpO2: 98%   Body mass index is 28.51 kg/m.  General:  WDWN in NAD Gait: Not observed HENT: WNL, normocephalic Pulmonary: normal non-labored breathing, without Rales, rhonchi,  wheezing Cardiac: regular, without  Murmurs, rubs or gallops; without carotid bruits Abdomen: Positive bowel sounds, soft, NT/ND, no masses Skin: without rashes Vascular Exam/Pulses: right lower extremity warm with palpable pulses. Left lower extremity is cool to touch at the foot and toes are purple in color with no capillary refill. Unable to obtain doppler PT or DP pulses.  Extremities: with ischemic changes, without Gangrene , with cellulitis; without open wounds;  Musculoskeletal: no muscle wasting or atrophy  Neurologic: A&O X 3;  No focal weakness or paresthesias are detected; speech is  fluent/normal Psychiatric:  The pt has Normal affect. Lymph:  Unremarkable  CBC    Component Value Date/Time   WBC 14.4 (H) 07/23/2022 0423   RBC 3.96 (L) 07/23/2022 0423   HGB 12.0 (L) 07/26/2022 0747   HGB 10.6 (L) 03/26/2014 1232   HCT 36.6 (L) 07/23/2022 0423   HCT 32.1 (L) 03/26/2014 1232   PLT 430 (H) 07/23/2022 0423   PLT 137 (L) 03/26/2014 1232   MCV 92.4 07/23/2022 0423   MCV 106 (H) 03/26/2014 1232   MCH 28.0 07/23/2022 0423   MCHC 30.3 07/23/2022 0423   RDW 18.6 (H) 07/23/2022 0423   RDW 15.1 (H) 03/26/2014 1232   LYMPHSABS 0.9 08/17/2020 1837   LYMPHSABS 0.4 (L) 03/26/2014 1232   MONOABS  0.5 08/17/2020 1837   MONOABS 0.3 03/26/2014 1232   EOSABS 0.1 08/17/2020 1837   EOSABS 0.0 03/26/2014 1232   BASOSABS 0.1 08/17/2020 1837   BASOSABS 0.0 03/26/2014 1232    BMET    Component Value Date/Time   NA 137 07/26/2022 0747   NA 139 03/28/2014 0823   K 4.1 07/26/2022 0747   K 3.4 (L) 03/28/2014 0823   CL 102 07/26/2022 0747   CL 111 (H) 03/28/2014 0823   CO2 26 07/26/2022 0747   CO2 22 03/28/2014 0823   GLUCOSE 95 07/26/2022 0747   GLUCOSE 106 (H) 03/28/2014 0823   BUN 34 (H) 07/26/2022 0747   BUN 8 03/28/2014 0823   CREATININE 1.10 07/26/2022 0747   CREATININE 0.97 03/28/2014 0823   CALCIUM 8.6 (L) 07/26/2022 0747   CALCIUM 7.8 (L) 03/28/2014 0823   GFRNONAA >60 07/26/2022 0747   GFRNONAA >60 03/28/2014 0823   GFRAA >60 02/21/2020 0630   GFRAA >60 03/28/2014 0823    COAGS: Lab Results  Component Value Date   INR 1.1 05/29/2021   INR 1.6 (H) 04/11/2021   INR 1.4 (H) 04/10/2021     Non-Invasive Vascular Imaging:   Ultra Sound with ABI's ordered.   Statin:  Yes.   Beta Blocker:  Yes.   Aspirin:  No. ACEI:  No. ARB:  No. CCB use:  No Other antiplatelets/anticoagulants:  No.    ASSESSMENT/PLAN: This is a 62 y.o. male who presents to [facility] emergency department with complaints of increasing left foot pain and cold toes worsening over the last  month. No open sores or gangrene to note.   Vascular Surgery plans on taking the patient to the vascular lab tomorrow 03/11/23 for left lower extremity angiogram with possible intervention. I discussed in detail with the patient the procedure, benefits, risks and complications. He verbalizes his understanding and would like to proceed. I answered all the patients questions today. Patient will be made NPO after midnight tonight for procedure tomorrow.    -I discussed in detail the plan with Dr Festus Barren MD and he agrees with the plan.    Marcie Bal [facility] 03/10/2023 9:20 AM

## 2023-03-10 NOTE — ED Notes (Signed)
Pt to ED for L foot burning pain and coolness since last few weeks. Hx stent to same leg. Hard to walk and stand on foot. Weak pedal pulse. EDP has evaluated.

## 2023-03-10 NOTE — Assessment & Plan Note (Signed)
Creatinine 1.7 with GFR greater than 60 Monitor

## 2023-03-10 NOTE — Assessment & Plan Note (Signed)
1 pack/day smoker Discussed cessation at length Nicotine patch 

## 2023-03-10 NOTE — ED Provider Notes (Signed)
[facility] Provider Note    Event Date/Time   First MD Initiated Contact with Patient 03/10/23 0901     (approximate)   History   Foot Pain   HPI  Cameron [name] is a 62 y.o. male with history of CHF CAD hyperlipidemia peripheral arterial disease status post revascularization of left lower extremity in 2021 per review of records who presents with complaints of increasing foot pain, sensation of cold toes worsening over the last couple of weeks.     Physical Exam   Triage Vital Signs: ED Triage Vitals  Encounter Vitals Group     BP 03/10/23 0856 122/71     Systolic BP Percentile --      Diastolic BP Percentile --      Pulse Rate 03/10/23 0856 73     Resp 03/10/23 0856 18     Temp 03/10/23 0856 97.7 F (36.5 C)     Temp Source 03/10/23 0856 Oral     SpO2 03/10/23 0856 98 %     Weight 03/10/23 0857 82.6 kg (182 lb)     Height 03/10/23 0857 1.702 m (5\' 7" )     Head Circumference --      Peak Flow --      Pain Score 03/10/23 0857 6     Pain Loc --      Pain Education --      Exclude from Growth Chart --     Most recent vital signs: Vitals:   03/10/23 0856  BP: 122/71  Pulse: 73  Resp: 18  Temp: 97.7 F (36.5 C)  SpO2: 98%     General: Awake, no distress.  CV:  Good peripheral perfusion.  Resp:  Normal effort.  Abd:  No distention.  Other:  Left foot: First and second toe mildly cooler than the rest of the foot, 1+ palpable DP pulse, no evidence of infection, tenderness along the plantar of the foot   ED Results / Procedures / Treatments   Labs (all labs ordered are listed, but only abnormal results are displayed) Labs Reviewed  CBC - Abnormal; Notable for the following components:      Result Value   RBC 4.13 (*)    Hemoglobin 11.9 (*)    HCT 38.8 (*)    RDW 16.6 (*)    All other components within normal limits  COMPREHENSIVE METABOLIC PANEL  APTT  PROTIME-INR      EKG     RADIOLOGY     PROCEDURES:  Critical Care performed: yes  CRITICAL CARE Performed by: Jene Every   Total critical care time: 30 minutes  Critical care time was exclusive of separately billable procedures and treating other patients.  Critical care was necessary to treat or prevent imminent or life-threatening deterioration.  Critical care was time spent personally by me on the following activities: development of treatment plan with patient and/or surrogate as well as nursing, discussions with consultants, evaluation of patient's response to treatment, examination of patient, obtaining history from patient or surrogate, ordering and performing treatments and interventions, ordering and review of laboratory studies, ordering and review of radiographic studies, pulse oximetry and re-evaluation of patient's condition.   Procedures   MEDICATIONS ORDERED IN ED: Medications  morphine (PF) 4 MG/ML injection 4 mg (4 mg Intravenous Given 03/10/23 0922)  ondansetron (ZOFRAN) injection 4 mg (4 mg Intravenous Given 03/10/23 1610)     IMPRESSION / MDM / ASSESSMENT AND PLAN / ED COURSE  I reviewed  the triage vital signs and the nursing notes. Patient's presentation is most consistent with acute presentation with potential threat to life or bodily function.  Patient with known PAD presents with complaints of worsening foot pain.  Found to have cool toes on the left foot otherwise foot is warm and decently perfused  Discussed with Dr. Wyn Quaker of vascular surgery, who recommends heparin drip, admission to the hospital for angiogram  Lab work reviewed, patient treated with morphine and Zofran  Will discuss with the hospitalist for admission      FINAL CLINICAL IMPRESSION(S) / ED DIAGNOSES   Final diagnoses:  Ischemia of foot     Rx / DC Orders   ED Discharge Orders     None        Note:  This document was prepared using Dragon voice recognition software  and may include unintentional dictation errors.   Jene Every, MD 03/10/23 1009

## 2023-03-10 NOTE — ED Notes (Signed)
See triage note  Presents with pain to left foot  States he noticed some discomfort 3 weeks ago  Hx of stent to same leg about 3 years ago  Denies any recent injury to foot/leg

## 2023-03-10 NOTE — ED Triage Notes (Signed)
Patient to ED via [facility] from [facility] for lkeft foot pain x3 weeks. Concerned for possible blood clot. Redness and burning per patient. Hx of stent in left leg and gout.

## 2023-03-11 ENCOUNTER — Encounter
Admission: EM | Disposition: A | Discharge status: Home / Self Care | Payer: Self-pay | Source: Ambulatory Visit | Attending: Osteopathic Medicine

## 2023-03-11 DIAGNOSIS — I70201 Unspecified atherosclerosis of native arteries of extremities, right leg: Secondary | ICD-10-CM

## 2023-03-11 DIAGNOSIS — Z9889 Other specified postprocedural states: Secondary | ICD-10-CM | POA: Diagnosis not present

## 2023-03-11 DIAGNOSIS — T82868A Thrombosis of vascular prosthetic devices, implants and grafts, initial encounter: Secondary | ICD-10-CM | POA: Diagnosis not present

## 2023-03-11 DIAGNOSIS — T82856A Stenosis of peripheral vascular stent, initial encounter: Secondary | ICD-10-CM

## 2023-03-11 DIAGNOSIS — I743 Embolism and thrombosis of arteries of the lower extremities: Secondary | ICD-10-CM

## 2023-03-11 DIAGNOSIS — I70222 Atherosclerosis of native arteries of extremities with rest pain, left leg: Principal | ICD-10-CM

## 2023-03-11 DIAGNOSIS — I998 Other disorder of circulatory system: Secondary | ICD-10-CM | POA: Diagnosis not present

## 2023-03-11 HISTORY — PX: LOWER EXTREMITY ANGIOGRAPHY: CATH118251

## 2023-03-11 LAB — BASIC METABOLIC PANEL
Anion gap: 5 (ref 5–15)
BUN: 18 mg/dL (ref 8–23)
CO2: 25 mmol/L (ref 22–32)
Calcium: 8.2 mg/dL — ABNORMAL LOW (ref 8.9–10.3)
Chloride: 107 mmol/L (ref 98–111)
Creatinine, Ser: 1.11 mg/dL (ref 0.61–1.24)
GFR, Estimated: >60 mL/min (ref >60)
Glucose, Bld: 93 mg/dL (ref 70–99)
Potassium: 3.7 mmol/L (ref 3.5–5.1)
Sodium: 137 mmol/L (ref 135–145)

## 2023-03-11 LAB — CBC
HCT: 33.1 % — ABNORMAL LOW (ref 39.0–52.0)
Hemoglobin: 10.2 g/dL — ABNORMAL LOW (ref 13.0–17.0)
MCH: 28.9 pg (ref 26.0–34.0)
MCHC: 30.8 g/dL (ref 30.0–36.0)
MCV: 93.8 fL (ref 80.0–100.0)
Platelets: 144 10*3/uL — ABNORMAL LOW (ref 150–400)
RBC: 3.53 MIL/uL — ABNORMAL LOW (ref 4.22–5.81)
RDW: 16.8 % — ABNORMAL HIGH (ref 11.5–15.5)
WBC: 5.7 10*3/uL (ref 4.0–10.5)
nRBC: 0 % (ref 0.0–0.2)

## 2023-03-11 LAB — MAGNESIUM: Magnesium: 1.9 mg/dL (ref 1.7–2.4)

## 2023-03-11 LAB — HEPARIN LEVEL (UNFRACTIONATED): Heparin Unfractionated: 0.31 [IU]/mL (ref 0.30–0.70)

## 2023-03-11 LAB — PHOSPHORUS: Phosphorus: 3.3 mg/dL (ref 2.5–4.6)

## 2023-03-11 SURGERY — LOWER EXTREMITY ANGIOGRAPHY
Anesthesia: Moderate Sedation | Laterality: Left

## 2023-03-11 MED ORDER — FENTANYL CITRATE (PF) 100 MCG/2ML IJ SOLN
INTRAMUSCULAR | Status: AC
  Filled 2023-03-11: qty 2

## 2023-03-11 MED ORDER — APIXABAN 5 MG PO TABS
5.0000 mg | ORAL_TABLET | Freq: Two times a day (BID) | ORAL | Status: DC
  Administered 2023-03-12: 5 mg via ORAL
  Filled 2023-03-11 (×2): qty 1

## 2023-03-11 MED ORDER — MIDAZOLAM HCL 2 MG/2ML IJ SOLN
INTRAMUSCULAR | Status: DC | PRN
  Administered 2023-03-11: 1 mg via INTRAVENOUS

## 2023-03-11 MED ORDER — LIDOCAINE-EPINEPHRINE (PF) 1 %-1:200000 IJ SOLN
INTRAMUSCULAR | Status: DC | PRN
  Administered 2023-03-11: 10 mL

## 2023-03-11 MED ORDER — MIDAZOLAM HCL 2 MG/2ML IJ SOLN
INTRAMUSCULAR | Status: AC
  Filled 2023-03-11: qty 2

## 2023-03-11 MED ORDER — DIPHENHYDRAMINE HCL 50 MG/ML IJ SOLN
INTRAMUSCULAR | Status: AC
  Filled 2023-03-11: qty 1

## 2023-03-11 MED ORDER — IODIXANOL 320 MG/ML IV SOLN
INTRAVENOUS | Status: DC | PRN
  Administered 2023-03-11: 50 mL

## 2023-03-11 MED ORDER — ASPIRIN 81 MG PO TBEC
81.0000 mg | DELAYED_RELEASE_TABLET | Freq: Every day | ORAL | Status: DC
  Administered 2023-03-12: 81 mg via ORAL
  Filled 2023-03-11: qty 1

## 2023-03-11 MED ORDER — HEPARIN (PORCINE) IN NACL 1000-0.9 UT/500ML-% IV SOLN
INTRAVENOUS | Status: DC | PRN
  Administered 2023-03-11: 1000 mL

## 2023-03-11 MED ORDER — HEPARIN SODIUM (PORCINE) 1000 UNIT/ML IJ SOLN
INTRAMUSCULAR | Status: DC | PRN
  Administered 2023-03-11: 5000 [IU] via INTRAVENOUS

## 2023-03-11 MED ORDER — FENTANYL CITRATE (PF) 100 MCG/2ML IJ SOLN
INTRAMUSCULAR | Status: DC | PRN
  Administered 2023-03-11: 50 ug via INTRAVENOUS

## 2023-03-11 MED ORDER — HEPARIN SODIUM (PORCINE) 1000 UNIT/ML IJ SOLN
INTRAMUSCULAR | Status: AC
  Filled 2023-03-11: qty 10

## 2023-03-11 MED ORDER — DIPHENHYDRAMINE HCL 50 MG/ML IJ SOLN
INTRAMUSCULAR | Status: DC | PRN
  Administered 2023-03-11: 25 mg via INTRAVENOUS

## 2023-03-11 MED ORDER — CEFAZOLIN SODIUM-DEXTROSE 2-4 GM/100ML-% IV SOLN
INTRAVENOUS | Status: AC
  Filled 2023-03-11: qty 100

## 2023-03-11 MED ORDER — SODIUM CHLORIDE 0.9 % IV BOLUS
INTRAVENOUS | Status: DC | PRN
  Administered 2023-03-11: 250 mL via INTRAVENOUS

## 2023-03-11 SURGICAL SUPPLY — 22 items
BALLN LUTONIX 018 5X100X130 (BALLOONS) ×1
BALLN LUTONIX 018 5X80X130 (BALLOONS) ×1
BALLN ULTRVRSE 3X100X150 (BALLOONS) ×1
BALLOON LUTONIX 018 5X100X130 (BALLOONS) IMPLANT
BALLOON LUTONIX 018 5X80X130 (BALLOONS) IMPLANT
BALLOON ULTRVRSE 3X100X150 (BALLOONS) IMPLANT
CATH ANGIO 5F PIGTAIL 65CM (CATHETERS) IMPLANT
CATH BEACON 5 .038 100 VERT TP (CATHETERS) IMPLANT
CATH ROTAREX 135 6FR (CATHETERS) IMPLANT
COVER PROBE ULTRASOUND 5X96 (MISCELLANEOUS) IMPLANT
DEVICE STARCLOSE SE CLOSURE (Vascular Products) IMPLANT
GLIDEWIRE ADV .035X260CM (WIRE) IMPLANT
KIT ENCORE 26 ADVANTAGE (KITS) IMPLANT
PACK ANGIOGRAPHY (CUSTOM PROCEDURE TRAY) ×1 IMPLANT
SHEATH ANL2 6FRX45 HC (SHEATH) IMPLANT
SHEATH BRITE TIP 5FRX11 (SHEATH) IMPLANT
STENT VIABAHN 6X100X120 (Permanent Stent) IMPLANT
STENT VIABAHN 6X7.5X120 (Permanent Stent) IMPLANT
SYR MEDRAD MARK 7 150ML (SYRINGE) IMPLANT
TUBING CONTRAST HIGH PRESS 72 (TUBING) IMPLANT
WIRE G V18X300CM (WIRE) IMPLANT
WIRE GUIDERIGHT .035X150 (WIRE) IMPLANT

## 2023-03-11 NOTE — Interval H&P Note (Signed)
History and Physical Interval Note:  03/11/2023 10:11 AM  Cameron [name]  has presented today for surgery, with the diagnosis of PAD.  The various methods of treatment have been discussed with the patient and family. After consideration of risks, benefits and other options for treatment, the patient has consented to  Procedure(s): Lower Extremity Angiography (Left) as a surgical intervention.  The patient's history has been reviewed, patient examined, no change in status, stable for surgery.  I have reviewed the patient's chart and labs.  Questions were answered to the patient's satisfaction.     Festus Barren

## 2023-03-11 NOTE — Progress Notes (Signed)
PROGRESS NOTE    SELDEN ROMO   ZOX:[id number] DOB: [birth year]  DOA: 03/10/2023 Date of Service: 03/11/23 PCP: Oneita Hurt, No     Brief Narrative / Hospital Course:  Cameron [name] is a 62 y.o. male with medical history significant of CAD, HFrEF, hypertension hyperlipidemia, PAD, gout presenting w/ worsening burning pain LLE as well as cool toes. Pain is worse with ambulation.  History of peripheral artery disease status post stenting of left lower extremity in 2021. Patient reports overall stable disease since stenting. On Eliquis in the setting of atrial fibrillation per report. Patient does report missing some doses at times.  09/03: To ED. Admitted w/ limb ischemia. Started heparin gtt. Vascular consulted.  09/04: angiography, thrombectomy, balloon angioplasty and stenting today   Consultants:  Vascular Surgery   Procedures: 03/11/23 LLE angio      ASSESSMENT & PLAN:   Principal Problem:   Ischemia of foot Active Problems:   Essential hypertension   Hyperlipidemia   CAD (coronary artery disease), native coronary artery   Hypokalemia   New onset atrial fibrillation (HCC)   Tobacco abuse   Gout_left elbow   CKD (chronic kidney disease), stage II   HFrEF (heart failure with reduced ejection fraction) (HCC)   Limb ischemia  Limb ischemia Peripheral Arterial Disease s/p previous stenting LLE 2021 S/p angiography  Heparin drip per vascular  Pain control vascular surgery following    HFrEF (heart failure with reduced ejection fraction) (HCC) 2D echo January 2024 with a EF of 40 to 45% and grade 2 diastolic dysfunction Appears euvolemic Monitor volume status Cont home regimen including metoprolol, aldactone  Hold lasix in setting of hypokalemia for now    CKD (chronic kidney disease), stage II Creatinine 1.7 with GFR greater than 60 Monitor   Gout - left elbow Stable  Cont allopurinol and colchicine    Tobacco abuse 1 pack/day smoker cessation has been  discussed at length Nicotine patch   Atrial fibrillation  Rate controlled at present Continue home regimen including beta-blocker On heparin drip in the setting of limb ischemia Plan resume Eliquis per vascular     Hypokalemia Potassium 2.5 Check mag level Replete Hold offending agents (lasix) for now     CAD (coronary artery disease), native coronary artery Baseline CAD with NSTEMI and complex stenting 2006 and anterior STEMI/06/2016-s/p PCI with DES LAD  No active CP  Cont home regimen including statin    Hyperlipidemia Statin   Essential hypertension BP stable Titrate home regimen         DVT prophylaxis: on heparin gtt at this time for ischemia  IV fluids: no continuous IV fluids  Nutrition: cardiac diet  Central lines / invasive devices: none  Code Status: FULL CODE PRESUMED  ACP documentation reviewed: 03/11/23 none on file in [facility]  Current Admission Status: inpatient LOS: 1 TOC needs: pending procedure will consider PT/OT eval, no needs from [facility] at this time  Barriers to discharge / significant pending items: angiography today, pend further vascular recs based on this, anticipate here another few days              Subjective / Brief ROS:  Patient reports doing fine, seen following his angio, reports he's tired.  Denies CP/SOB.  Pain controlled.  Denies new weakness.  Reports no concerns w/ urination/defecation.   Family Communication: none at this time     Objective Findings:  Vitals:   03/11/23 1215 03/11/23 1230 03/11/23 1259 03/11/23 1335  BP: 91/67 Marland Kitchen)  88/60 97/64 103/60  Pulse: 73 71 68 62  Resp: 20 16 14 16   Temp:   98.2 F (36.8 C) 99 F (37.2 C)  TempSrc:    Oral  SpO2: 96% 96% 98% 98%  Weight:      Height:        Intake/Output Summary (Last 24 hours) at 03/11/2023 1600 Last data filed at 03/10/2023 2200 Gross per 24 hour  Intake 515.36 ml  Output --  Net 515.36 ml   Filed Weights   03/10/23 0857 03/11/23 0905   Weight: 82.6 kg 82.6 kg    Examination:  Physical Exam Constitutional:      General: He is not in acute distress.    Appearance: Normal appearance. He is not ill-appearing.  Cardiovascular:     Rate and Rhythm: Normal rate and regular rhythm.  Pulmonary:     Effort: Pulmonary effort is normal.     Breath sounds: Normal breath sounds.  Abdominal:     General: Abdomen is flat.     Palpations: Abdomen is soft.  Musculoskeletal:     Right lower leg: No edema.     Left lower leg: No edema.  Skin:    General: Skin is warm.  Neurological:     Mental Status: He is alert.  Psychiatric:        Mood and Affect: Mood normal.        Behavior: Behavior normal.          Scheduled Medications:   allopurinol  100 mg Oral Daily   [START ON 03/12/2023] apixaban  5 mg Oral BID   aspirin EC  81 mg Oral Daily   atorvastatin  80 mg Oral Daily   colchicine  0.6 mg Oral Daily   dapagliflozin propanediol  10 mg Oral Daily   influenza vac split trivalent PF  0.5 mL Intramuscular Tomorrow-1000   metoprolol succinate  12.5 mg Oral QHS   nicotine  21 mg Transdermal Daily   pantoprazole  40 mg Oral Daily   spironolactone  12.5 mg Oral Daily    Continuous Infusions:  heparin 1,200 Units/hr (03/11/23 1230)    PRN Medications:  acetaminophen, HYDROmorphone (DILAUDID) injection, nitroGLYCERIN  Antimicrobials from admission:  Anti-infectives (From admission, onward)    Start     Dose/Rate Route Frequency Ordered Stop   03/11/23 0000  ceFAZolin (ANCEF) IVPB 2g/100 mL premix        2 g 200 mL/hr over 30 Minutes Intravenous 30 min pre-op 03/10/23 1831 03/11/23 1318           Data Reviewed:  I have personally reviewed the following...  CBC: Recent Labs  Lab 03/10/23 0916 03/11/23 0716  WBC 6.8 5.7  HGB 11.9* 10.2*  HCT 38.8* 33.1*  MCV 93.9 93.8  PLT 161 144*   Basic Metabolic Panel: Recent Labs  Lab 03/10/23 0916 03/10/23 1651 03/11/23 0716  NA 138 138 137  K 2.5*  3.2* 3.7  CL 102 102 107  CO2 26 29 25   GLUCOSE 96 101* 93  BUN 21 18 18   CREATININE 1.28* 1.27* 1.11  CALCIUM 8.9 8.6* 8.2*  MG 1.8  --  1.9  PHOS  --   --  3.3   GFR: Estimated Creatinine Clearance: 71.9 mL/min (by C-G formula based on SCr of 1.11 mg/dL). Liver Function Tests: Recent Labs  Lab 03/10/23 0916  AST 24  ALT 14  ALKPHOS 91  BILITOT 0.2*  PROT 7.2  ALBUMIN 4.3   No  results for input(s): "LIPASE", "AMYLASE" in the last 168 hours. No results for input(s): "AMMONIA" in the last 168 hours. Coagulation Profile: Recent Labs  Lab 03/10/23 0916  INR 1.0   Cardiac Enzymes: No results for input(s): "CKTOTAL", "CKMB", "CKMBINDEX", "TROPONINI" in the last 168 hours. BNP (last 3 results) No results for input(s): "PROBNP" in the last 8760 hours. HbA1C: No results for input(s): "HGBA1C" in the last 72 hours. CBG: No results for input(s): "GLUCAP" in the last 168 hours. Lipid Profile: No results for input(s): "CHOL", "HDL", "LDLCALC", "TRIG", "CHOLHDL", "LDLDIRECT" in the last 72 hours. Thyroid Function Tests: No results for input(s): "TSH", "T4TOTAL", "FREET4", "T3FREE", "THYROIDAB" in the last 72 hours. Anemia Panel: No results for input(s): "VITAMINB12", "FOLATE", "FERRITIN", "TIBC", "IRON", "RETICCTPCT" in the last 72 hours. Most Recent Urinalysis On File:     Component Value Date/Time   COLORURINE RED (A) 07/25/2022 1140   APPEARANCEUR CLOUDY (A) 07/25/2022 1140   APPEARANCEUR Hazy 11/20/2011 1343   LABSPEC 1.017 07/25/2022 1140   LABSPEC 1.026 11/20/2011 1343   PHURINE  07/25/2022 1140    TEST NOT REPORTED DUE TO COLOR INTERFERENCE OF URINE PIGMENT   GLUCOSEU (A) 07/25/2022 1140    TEST NOT REPORTED DUE TO COLOR INTERFERENCE OF URINE PIGMENT   GLUCOSEU Negative 11/20/2011 1343   HGBUR (A) 07/25/2022 1140    TEST NOT REPORTED DUE TO COLOR INTERFERENCE OF URINE PIGMENT   BILIRUBINUR (A) 07/25/2022 1140    TEST NOT REPORTED DUE TO COLOR INTERFERENCE OF  URINE PIGMENT   BILIRUBINUR Negative 11/20/2011 1343   KETONESUR (A) 07/25/2022 1140    TEST NOT REPORTED DUE TO COLOR INTERFERENCE OF URINE PIGMENT   PROTEINUR (A) 07/25/2022 1140    TEST NOT REPORTED DUE TO COLOR INTERFERENCE OF URINE PIGMENT   NITRITE (A) 07/25/2022 1140    TEST NOT REPORTED DUE TO COLOR INTERFERENCE OF URINE PIGMENT   LEUKOCYTESUR (A) 07/25/2022 1140    TEST NOT REPORTED DUE TO COLOR INTERFERENCE OF URINE PIGMENT   LEUKOCYTESUR Negative 11/20/2011 1343   Sepsis Labs: @LABRCNTIP (procalcitonin:4,lacticidven:4) Microbiology: No results found for this or any previous visit (from the past 240 hour(s)).    Radiology Studies last 3 days: PERIPHERAL VASCULAR CATHETERIZATION  Result Date: 03/11/2023 See surgical note for result.  US ARTERIAL ABI (SCREENING LOWER EXTREMITY)  Result Date: 03/10/2023 CLINICAL DATA:  Hypertension Hyperlipidemia CAD Current smoker Foot ischemia EXAM: NONINVASIVE PHYSIOLOGIC VASCULAR STUDY OF BILATERAL LOWER EXTREMITIES TECHNIQUE: Evaluation of both lower extremities were performed at rest, including calculation of ankle-brachial indices with single level pressure measurements and doppler recording. COMPARISON:  None available. FINDINGS: Right ABI:  0.93 Left ABI:  0.51 Right Lower Extremity: Posterior tibial and dorsalis pedis artery waveforms are monophasic. Left Lower Extremity: Posterior tibial dorsalis pedis artery waveforms are monophasic. 0.5-0.79 Moderate PAD IMPRESSION: Findings consistent with moderate left and mild right lower extremity arterial occlusive disease. Electronically Signed   By: Acquanetta Belling M.D.   On: 03/10/2023 11:33             LOS: 1 day       Sunnie Nielsen, DO [facility] 03/11/2023, 4:00 PM    Dictation software may have been used to generate the above note. Typos may occur and escape review in typed/dictated notes. Please contact Dr Lyn Hollingshead directly for clarity if needed.  Staff may  message me via secure chat in Epic  but this may not receive an immediate response,  please page me for urgent matters!  If  7PM-7AM, please contact night coverage [email or web address]

## 2023-03-11 NOTE — Op Note (Signed)
[facility]  Percutaneous Study/Intervention Procedural Note   Date of Surgery:  03/11/2023  Surgeon(s):Jerret Mcbane    Assistants:none  Pre-operative Diagnosis: PAD with rest pain left lower extremity with somewhat acute to subacute on chronic ischemia  Post-operative diagnosis:  Same  Procedure(s) Performed:             1.  Ultrasound guidance for vascular access right femoral artery             2.  Catheter placement into left femoral artery from right femoral approach             3.  Aortogram and selective left lower extremity angiogram             4.  Mechanical thrombectomy of the left SFA, popliteal artery, and tibioperoneal trunk with the Rota Rex device             5.  Stent placement to the left proximal SFA with 6 mm diameter by 7.5 cm length Viabahn stent  6.  Stent placement to the left popliteal artery with 6 mm diameter by 10 cm length Viabahn stent  7.  Angioplasty of the left tibioperoneal trunk and proximal peroneal artery with 3 mm diameter by 10 cm length balloon             8.  StarClose closure device right femoral artery  EBL: 100 cc  Contrast: 50 cc  Fluoro Time: 7.4 minutes  Moderate Conscious Sedation Time: approximately 39 minutes using 1 mg of Versed and 50 mcg of Fentanyl              Indications:  Patient is a 62 y.o.male with a history of PAD with intervention a few years ago with no subsequent follow-up. The patient is brought in for angiography for further evaluation and potential treatment.  Due to the limb threatening nature of the situation, angiogram was performed for attempted limb salvage. The patient is aware that if the procedure fails, amputation would be expected.  The patient also understands that even with successful revascularization, amputation may still be required due to the severity of the situation.  Risks and benefits are discussed and informed consent is obtained.   Procedure:  The patient was identified and  appropriate procedural time out was performed.  The patient was then placed supine on the table and prepped and draped in the usual sterile fashion. Moderate conscious sedation was administered during a face to face encounter with the patient throughout the procedure with my supervision of the RN administering medicines and monitoring the patient's vital signs, pulse oximetry, telemetry and mental status throughout from the start of the procedure until the patient was taken to the recovery room. Ultrasound was used to evaluate the right common femoral artery.  It was patent .  A digital ultrasound image was acquired.  A Seldinger needle was used to access the right common femoral artery under direct ultrasound guidance and a permanent image was performed.  A 0.035 J wire was advanced without resistance and a 5Fr sheath was placed.  Pigtail catheter was placed into the aorta and an AP aortogram was performed. This demonstrated normal renal arteries and normal aorta.  The left iliac stent was patent and there did not appear to be any significant stenosis on the left side.  There is at least borderline 50% stenosis in the right common and external iliac arteries.  I then crossed the aortic bifurcation and advanced to the left  femoral head. Selective left lower extremity angiogram was then performed. This demonstrated flush occlusion of the left SFA with multiple previous placed stents that were all occluded.  There was also significant disease in the profunda femoris artery that appeared to be 70% or greater.  There was reconstitution of the distal popliteal artery with a small anterior tibial artery that appeared to be patent distally and then the peroneal artery was actually larger but had a high-grade stenosis of greater than 80% in the tibioperoneal trunk and most proximal portion of the peroneal artery.  The posterior tibial artery was chronically occluded without distal reconstitution identified. It was felt that  it was in the patient's best interest to proceed with intervention after these images to avoid a second procedure and a larger amount of contrast and fluoroscopy based off of the findings from the initial angiogram. The patient was systemically heparinized and a 6 [place] Ansell sheath was then placed over the [employer] wire. I then used a Kumpe catheter and the advantage wire to easily cross the occlusion and then the TP trunk stenosis, confirming intraluminal flow in the peroneal artery below the disease.  I then placed a V18 wire.  I began with mechanical thrombectomy with 2 passes with the Rota Rex device in the left SFA, popliteal artery, and down into the tibioperoneal trunk.  A large volume of thrombus was removed with residual hyperplastic stenosis and thrombus at the proximal edge of the previously placed stent in the SFA as well as at the distal edge of the previously placed stent in the popliteal artery.  The degree of stenosis was greater than 70% in both locations and there remained the disease in the tibioperoneal trunk and proximal peroneal artery.  I elected to primarily cover the areas in the SFA and popliteal arteries due to the associated thrombus.  A 6 mm diameter by 7.5 cm length Viabahn stent was placed proximally in the SFA and a 6 mm diameter by 10 cm length Viabahn stent was placed distally in the popliteal artery.  These were postdilated with 5 mm Lutonix drug-coated balloons with excellent angiographic completion result and less than 10% residual stenosis in both locations.  A 3 mm diameter by 10 cm length angioplasty was then used to address the proximal peroneal artery and tibioperoneal trunk.  It was inflated to 10 atm for 1 minute.  Completion imaging showed significant improvement with about a 20-25% residual stenosis in the tibioperoneal trunk that did not appear flow-limiting I elected to terminate the procedure. The sheath was removed and StarClose closure device was deployed  in the right femoral artery with excellent hemostatic result. The patient was taken to the recovery room in stable condition having tolerated the procedure well.  Findings:               Aortogram:  This demonstrated normal renal arteries and normal aorta.  The left iliac stent was patent and there did not appear to be any significant stenosis on the left side.  There is at least borderline 50% stenosis in the right common and external iliac arteries.             Left lower Extremity:  This demonstrated flush occlusion of the left SFA with multiple previous placed stents that were all occluded.  There was also significant disease in the profunda femoris artery that appeared to be 70% or greater.  There was reconstitution of the distal popliteal artery with a small anterior tibial artery  that appeared to be patent distally and then the peroneal artery was actually larger but had a high-grade stenosis of greater than 80% in the tibioperoneal trunk and most proximal portion of the peroneal artery.  The posterior tibial artery was chronically occluded without distal reconstitution identified.   Disposition: Patient was taken to the recovery room in stable condition having tolerated the procedure well.  Complications: None  Festus Barren 03/11/2023 12:03 PM   This note was created with Dragon Medical transcription system. Any errors in dictation are purely unintentional.

## 2023-03-11 NOTE — Hospital Course (Addendum)
Cameron [name] is a 62 y.o. male with medical history significant of CAD, HFrEF, hypertension hyperlipidemia, PAD, gout presenting w/ worsening burning pain LLE as well as cool toes. Pain is worse with ambulation.  History of peripheral artery disease status post stenting of left lower extremity in 2021. Patient reports overall stable disease since stenting. On Eliquis in the setting of atrial fibrillation per report. Patient does report missing some doses at times.  09/03: To ED. Admitted w/ limb ischemia. Started heparin gtt. Vascular consulted.  09/04: angiography, thrombectomy, balloon angioplasty and stenting today   Consultants:  Vascular Surgery   Procedures: 03/11/23 LLE angio      ASSESSMENT & PLAN:   Principal Problem:   Ischemia of foot Active Problems:   Essential hypertension   Hyperlipidemia   CAD (coronary artery disease), native coronary artery   Hypokalemia   New onset atrial fibrillation (HCC)   Tobacco abuse   Gout_left elbow   CKD (chronic kidney disease), stage II   HFrEF (heart failure with reduced ejection fraction) (HCC)   Limb ischemia  Limb ischemia Peripheral Arterial Disease s/p previous stenting LLE 2021 S/p angiography  Heparin drip per vascular  Pain control vascular surgery following    HFrEF (heart failure with reduced ejection fraction) (HCC) 2D echo January 2024 with a EF of 40 to 45% and grade 2 diastolic dysfunction Appears euvolemic Monitor volume status Cont home regimen including metoprolol, aldactone  Hold lasix in setting of hypokalemia for now    CKD (chronic kidney disease), stage II Creatinine 1.7 with GFR greater than 60 Monitor   Gout - left elbow Stable  Cont allopurinol and colchicine    Tobacco abuse 1 pack/day smoker cessation has been discussed at length Nicotine patch   Atrial fibrillation  Rate controlled at present Continue home regimen including beta-blocker On heparin drip in the setting of limb  ischemia Plan resume Eliquis per vascular     Hypokalemia Potassium 2.5 Check mag level Replete Hold offending agents (lasix) for now     CAD (coronary artery disease), native coronary artery Baseline CAD with NSTEMI and complex stenting 2006 and anterior STEMI/06/2016-s/p PCI with DES LAD  No active CP  Cont home regimen including statin    Hyperlipidemia Statin   Essential hypertension BP stable Titrate home regimen         DVT prophylaxis: on heparin gtt at this time for ischemia  IV fluids: no continuous IV fluids  Nutrition: cardiac diet  Central lines / invasive devices: none  Code Status: FULL CODE PRESUMED  ACP documentation reviewed: 03/11/23 none on file in [facility]  Current Admission Status: inpatient LOS: 1 TOC needs: pending procedure will consider PT/OT eval, no needs from [facility] at this time  Barriers to discharge / significant pending items: angiography today, pend further vascular recs based on this, anticipate here another few days

## 2023-03-11 NOTE — Consult Note (Addendum)
Pharmacy Consult Note - Anticoagulation  Pharmacy Consult for heparin Indication:  PAD  PATIENT MEASUREMENTS: Height: 5\' 7"  (170.2 cm) Weight: 82.6 kg (182 lb) IBW/kg (Calculated) : 66.1 HEPARIN DW (KG): 82.6  VITAL SIGNS: Temp: 97.8 F (36.6 C) (09/03 2034) Temp Source: Oral (09/03 2034) BP: 105/64 (09/03 2034) Pulse Rate: 69 (09/03 2034)  Recent Labs    03/10/23 0916 03/10/23 1651 03/10/23 2253  HGB 11.9*  --   --   HCT 38.8*  --   --   PLT 161  --   --   APTT 31 74* 74*  LABPROT 13.2  --   --   INR 1.0  --   --   HEPARINUNFRC 0.57 0.57 0.39  CREATININE 1.28* 1.27*  --     Estimated Creatinine Clearance: 62.8 mL/min (A) (by C-G formula based on SCr of 1.27 mg/dL (H)).  PAST MEDICAL HISTORY: Past Medical History:  Diagnosis Date   CHF (congestive heart failure) (HCC)    Coronary artery disease    a. 12/17 PCI with DES to pLAD with resdiual disease (RX therapy)   Hyperlipidemia    Hypertension    PAD (peripheral artery disease) (HCC)    STEMI (ST elevation myocardial infarction) (HCC) 2017    ASSESSMENT: 62 y.o. male with PMH PAD s/p revasc in 2021 and possible left AKA or BKA, CAD, HLD, HFmrEF (EF 40-45%), gout  is presenting with ischemic leg pain, endorsing redness and swelling. ABI performed on both sides, yielding 0.5-0.9 range, consistent with moderate PAD. Vascular team has been consulted and they are planning to operate on patient tomorrow, 9/4.   Patient is on chronic anticoagulation (apixaban) per chart review. Last dose of apixaban was 9/2 at 2300. Pharmacy has been consulted to initiate and manage heparin intravenous infusion.  Pertinent medications: Apixaban 5 mg PO BID (last dose 9/2 @ 2300)  Goal(s) of therapy: Heparin level 0.3 - 0.7 units/mL aPTT 66 - 102 seconds Monitor platelets by anticoagulation protocol: Yes   Baseline anticoagulation labs: Recent Labs    03/10/23 0916 03/10/23 1651 03/10/23 2253  APTT 31 74* 74*  INR 1.0  --    --   HGB 11.9*  --   --   PLT 161  --   --     Date Time aPTT//HL Rate/Comment 9/3 1651 74 s//0.57 Therapeutic and correlating 9/3 2253 74 /0.39 Therapeutic & Correlating x 2 9/4 0716 ---/0.31  Therapeutic x 3  PLAN: Continue heparin infusion at 1200 units/hour Recheck HL daily w/ AM labs while therapeutic Continue to monitor CBC daily while on heparin infusion.  Bettey Costa, PharmD Clinical Pharmacist 03/11/2023 8:20 AM

## 2023-03-11 NOTE — Progress Notes (Signed)
Progress Note    03/11/2023 11:44 AM Day of Surgery  Subjective:  [name] is a 62 yo male with a medical history significant of CAD, HFrEF, hypertension hyperlipidemia, PAD,gout presenting w/ limb ischemia. Pt reports worsening LLE pain over past 1-2 weeks. History of peripheral artery disease status post stenting of left lower extremity in 2021.  Patient reports overall stable disease since stenting.  On Eliquis in the setting of atrial fibrillation per report.  Patient does report missing some doses at times.  +1 pack/day smoker.  No chest pain or shortness of breath.  No nausea or vomiting.  Has had worsening burning pain as well as cool toes.  Pain is worse with ambulation and has not become present at rest. Patient endorses being NPO after midnight for procedure later today.    Vitals:   03/11/23 1134 03/11/23 1143  BP:    Pulse:    Resp:    Temp:    SpO2: 96% 98%   Physical Exam: Cardiac:  RRR, Normal S1, S2. No murmurs Lungs:  Clear throughout on auscultation. No rales, rhonchi or wheezing  Incisions:  None Extremities:  Right lower extremity warm to touch with palpable pulses. Good capillary refill. Left lower extremity cool to touch from mid calf to toes. Unable to palpate or doppler pulses. Toes without capillary refill and purple.  Abdomen:  Positive bowel sounds throughout, soft, non tender and non distended.  Neurologic: AAOX4, follows commands and answers all questions appropriately.   CBC    Component Value Date/Time   WBC 5.7 03/11/2023 0716   RBC 3.53 (L) 03/11/2023 0716   HGB 10.2 (L) 03/11/2023 0716   HGB 10.6 (L) 03/26/2014 1232   HCT 33.1 (L) 03/11/2023 0716   HCT 32.1 (L) 03/26/2014 1232   PLT 144 (L) 03/11/2023 0716   PLT 137 (L) 03/26/2014 1232   MCV 93.8 03/11/2023 0716   MCV 106 (H) 03/26/2014 1232   MCH 28.9 03/11/2023 0716   MCHC 30.8 03/11/2023 0716   RDW 16.8 (H) 03/11/2023 0716   RDW 15.1 (H) 03/26/2014 1232   LYMPHSABS 0.9  08/17/2020 1837   LYMPHSABS 0.4 (L) 03/26/2014 1232   MONOABS 0.5 08/17/2020 1837   MONOABS 0.3 03/26/2014 1232   EOSABS 0.1 08/17/2020 1837   EOSABS 0.0 03/26/2014 1232   BASOSABS 0.1 08/17/2020 1837   BASOSABS 0.0 03/26/2014 1232    BMET    Component Value Date/Time   NA 137 03/11/2023 0716   NA 139 03/28/2014 0823   K 3.7 03/11/2023 0716   K 3.4 (L) 03/28/2014 0823   CL 107 03/11/2023 0716   CL 111 (H) 03/28/2014 0823   CO2 25 03/11/2023 0716   CO2 22 03/28/2014 0823   GLUCOSE 93 03/11/2023 0716   GLUCOSE 106 (H) 03/28/2014 0823   BUN 18 03/11/2023 0716   BUN 8 03/28/2014 0823   CREATININE 1.11 03/11/2023 0716   CREATININE 0.97 03/28/2014 0823   CALCIUM 8.2 (L) 03/11/2023 0716   CALCIUM 7.8 (L) 03/28/2014 0823   GFRNONAA >60 03/11/2023 0716   GFRNONAA >60 03/28/2014 0823   GFRAA >60 02/21/2020 0630   GFRAA >60 03/28/2014 0823    INR    Component Value Date/Time   INR 1.0 03/10/2023 0916     Intake/Output Summary (Last 24 hours) at 03/11/2023 1144 Last data filed at 03/10/2023 2200 Gross per 24 hour  Intake 515.36 ml  Output --  Net 515.36 ml     Assessment/Plan:  62  y.o. male admitted for left lower extremity ischemia.   Day of Surgery   PLAN:  Left Lower extremity angiogram with possible intervention planned for later today.    DVT prophylaxis:  Heparin Infusion    Marcie Bal [facility] 03/11/2023 11:44 AM

## 2023-03-11 NOTE — Consult Note (Signed)
PHARMACY CONSULT NOTE - ELECTROLYTES  Pharmacy Consult for Electrolyte Monitoring and Replacement   Recent Labs: Height: 5\' 7"  (170.2 cm) Weight: 82.6 kg (182 lb) IBW/kg (Calculated) : 66.1 Estimated Creatinine Clearance: 71.9 mL/min (by C-G formula based on SCr of 1.11 mg/dL). Potassium (mmol/L)  Date Value  03/11/2023 3.7  03/28/2014 3.4 (L)   Magnesium (mg/dL)  Date Value  28/41/3244 1.9  03/25/2014 1.9   Calcium (mg/dL)  Date Value  07/09/7251 8.2 (L)   Calcium, Total (mg/dL)  Date Value  66/44/0347 7.8 (L)   Albumin (g/dL)  Date Value  42/59/5638 4.3  03/24/2014 2.9 (L)   Phosphorus (mg/dL)  Date Value  75/64/3329 3.3   Sodium (mmol/L)  Date Value  03/11/2023 137  03/28/2014 139    Assessment  Cameron [name] is a 62 y.o. male presenting with foot pain. PMH significant for CAD, HFrEF, PAD, gout. Pharmacy has been consulted to monitor and replace electrolytes.  Diet: NPO MIVF: NS @ 75 mL/hr Pertinent medications: spironolactone 12.5mg  daily  Goal of Therapy: Electrolytes WNL  Plan:  No replacement currently indicated Check BMP, Mg, Phos with AM labs  Thank you for allowing pharmacy to be a part of this patient's care.  Bettey Costa, PharmD Clinical Pharmacist 03/11/2023 8:21 AM

## 2023-03-12 ENCOUNTER — Encounter: Payer: Self-pay | Admitting: Vascular Surgery

## 2023-03-12 DIAGNOSIS — I998 Other disorder of circulatory system: Secondary | ICD-10-CM | POA: Diagnosis not present

## 2023-03-12 LAB — BASIC METABOLIC PANEL
Anion gap: 5 (ref 5–15)
BUN: 14 mg/dL (ref 8–23)
CO2: 26 mmol/L (ref 22–32)
Calcium: 8.7 mg/dL — ABNORMAL LOW (ref 8.9–10.3)
Chloride: 108 mmol/L (ref 98–111)
Creatinine, Ser: 1.11 mg/dL (ref 0.61–1.24)
GFR, Estimated: >60 mL/min (ref >60)
Glucose, Bld: 93 mg/dL (ref 70–99)
Potassium: 4 mmol/L (ref 3.5–5.1)
Sodium: 139 mmol/L (ref 135–145)

## 2023-03-12 LAB — CBC
HCT: 32.8 % — ABNORMAL LOW (ref 39.0–52.0)
Hemoglobin: 9.9 g/dL — ABNORMAL LOW (ref 13.0–17.0)
MCH: 28.6 pg (ref 26.0–34.0)
MCHC: 30.2 g/dL (ref 30.0–36.0)
MCV: 94.8 fL (ref 80.0–100.0)
Platelets: 148 10*3/uL — ABNORMAL LOW (ref 150–400)
RBC: 3.46 MIL/uL — ABNORMAL LOW (ref 4.22–5.81)
RDW: 16.6 % — ABNORMAL HIGH (ref 11.5–15.5)
WBC: 6.1 10*3/uL (ref 4.0–10.5)
nRBC: 0 % (ref 0.0–0.2)

## 2023-03-12 MED ORDER — NICOTINE 14 MG/24HR TD PT24: MEDICATED_PATCH | TRANSDERMAL | 0 refills | Status: DC

## 2023-03-12 MED ORDER — ASPIRIN 81 MG PO TBEC: 81.0000 mg | DELAYED_RELEASE_TABLET | Freq: Every day | ORAL | 12 refills | Status: AC

## 2023-03-12 MED ORDER — ACETAMINOPHEN 325 MG PO TABS: 650.0000 mg | ORAL_TABLET | Freq: Four times a day (QID) | ORAL | Status: AC | PRN

## 2023-03-12 MED ORDER — SPIRONOLACTONE 25 MG PO TABS
12.5000 mg | ORAL_TABLET | Freq: Every day | ORAL | 0 refills | Status: AC
Start: 2023-03-13 — End: ?

## 2023-03-12 MED ORDER — OXYCODONE HCL 5 MG PO TABS: 5.0000 mg | ORAL_TABLET | Freq: Four times a day (QID) | ORAL | 0 refills | Status: DC | PRN

## 2023-03-12 MED ORDER — OXYCODONE HCL 5 MG PO TABS
5.0000 mg | ORAL_TABLET | ORAL | Status: DC | PRN
  Administered 2023-03-12: 5 mg via ORAL
  Filled 2023-03-12: qty 1

## 2023-03-12 NOTE — Progress Notes (Signed)
Progress Note    03/12/2023 3:02 PM 1 Day Post-Op  Subjective:  Cameron [name] is a 62 yo male POD#1 from aortogram with selective left lower extremity angiogram and mechanical thrombectomy of the left SFA, popliteal artery and tibioperoneal trunk with Rota Rex.  Patient also had stent placement to left proximal SFA and to the left popliteal artery.  Patient also had angioplasty to the left tibioperoneal trunk and proximal peroneal artery.  On exam today the patient endorses his left leg feels much better today. Endorses no more resting pain. Endorses feeling abnormal sensations such as tingling that comes and goes. Patient requests to go home today. No complaints overnight and vitals all remain stable.    Vitals:   03/12/23 0207 03/12/23 0818  BP: 121/68 99/67  Pulse: 73 71  Resp: 16 14  Temp: 98.4 F (36.9 C) 98.2 F (36.8 C)  SpO2: 95% 95%   Physical Exam: Cardiac:  RRR, Normal S1, S2. No murmurs appreciated Lungs:  Clear on auscultation, Normal respiratory effort, No rales, rhonchi and wheezing.  Incisions:  Right Groin with dressing clean dry and intact.  Extremities:  Bilateral lower extremities warm to touch, palpable pulses and left toes remain purple but show better capillary refill today.  Abdomen:  Positive bowel sounds throughout, soft, non tender and non distended  Neurologic: AAOX4, follows commands and answers questions appropriately.   CBC    Component Value Date/Time   WBC 6.1 03/12/2023 0601   RBC 3.46 (L) 03/12/2023 0601   HGB 9.9 (L) 03/12/2023 0601   HGB 10.6 (L) 03/26/2014 1232   HCT 32.8 (L) 03/12/2023 0601   HCT 32.1 (L) 03/26/2014 1232   PLT 148 (L) 03/12/2023 0601   PLT 137 (L) 03/26/2014 1232   MCV 94.8 03/12/2023 0601   MCV 106 (H) 03/26/2014 1232   MCH 28.6 03/12/2023 0601   MCHC 30.2 03/12/2023 0601   RDW 16.6 (H) 03/12/2023 0601   RDW 15.1 (H) 03/26/2014 1232   LYMPHSABS 0.9 08/17/2020 1837   LYMPHSABS 0.4 (L) 03/26/2014 1232    MONOABS 0.5 08/17/2020 1837   MONOABS 0.3 03/26/2014 1232   EOSABS 0.1 08/17/2020 1837   EOSABS 0.0 03/26/2014 1232   BASOSABS 0.1 08/17/2020 1837   BASOSABS 0.0 03/26/2014 1232    BMET    Component Value Date/Time   NA 139 03/12/2023 0601   NA 139 03/28/2014 0823   K 4.0 03/12/2023 0601   K 3.4 (L) 03/28/2014 0823   CL 108 03/12/2023 0601   CL 111 (H) 03/28/2014 0823   CO2 26 03/12/2023 0601   CO2 22 03/28/2014 0823   GLUCOSE 93 03/12/2023 0601   GLUCOSE 106 (H) 03/28/2014 0823   BUN 14 03/12/2023 0601   BUN 8 03/28/2014 0823   CREATININE 1.11 03/12/2023 0601   CREATININE 0.97 03/28/2014 0823   CALCIUM 8.7 (L) 03/12/2023 0601   CALCIUM 7.8 (L) 03/28/2014 0823   GFRNONAA >60 03/12/2023 0601   GFRNONAA >60 03/28/2014 0823   GFRAA >60 02/21/2020 0630   GFRAA >60 03/28/2014 0823    INR    Component Value Date/Time   INR 1.0 03/10/2023 0916     Intake/Output Summary (Last 24 hours) at 03/12/2023 1502 Last data filed at 03/12/2023 1445 Gross per 24 hour  Intake 864 ml  Output --  Net 864 ml     Assessment/Plan:  62 y.o. male is s/p Aortogram and selective left lower extremity angiogram with mechanical thrombectomy with angioplasty and stent placement  1 Day Post-Op   PLAN: Convert patient from Heparin Infusion to oral anticoagulation.  Patient was on prior ASA 81 mg daily and Eliquis 5 mg twice daily prior to admission.  Patient to be discharged on these medications as well as his home Lipitor at 80 mg daily. Okay to discharge home today if medically appropriate per vascular surgery. Follow-up with vein and vascular in 3 weeks with left lower extremity ultrasound and ABI.  Patient to be discharged on ASA 81 mg daily, Eliquis 5 mg twice daily, and Lipitor 80 mg daily.  No need for Plavix due to patient being on Eliquis prior.  DVT prophylaxis: Infusion converted to Eliquis 5 mg twice daily.   Marcie Bal [facility] 03/12/2023 3:02 PM

## 2023-03-12 NOTE — Consult Note (Signed)
PHARMACY CONSULT NOTE - ELECTROLYTES  Pharmacy Consult for Electrolyte Monitoring and Replacement   Recent Labs: Height: 5\' 7"  (170.2 cm) Weight: 82.6 kg (182 lb 1.6 oz) IBW/kg (Calculated) : 66.1 Estimated Creatinine Clearance: 71.9 mL/min (by C-G formula based on SCr of 1.11 mg/dL). Potassium (mmol/L)  Date Value  03/12/2023 4.0  03/28/2014 3.4 (L)   Magnesium (mg/dL)  Date Value  16/04/9603 1.9  03/25/2014 1.9   Calcium (mg/dL)  Date Value  54/03/8118 8.7 (L)   Calcium, Total (mg/dL)  Date Value  14/78/2956 7.8 (L)   Albumin (g/dL)  Date Value  21/30/8657 4.3  03/24/2014 2.9 (L)   Phosphorus (mg/dL)  Date Value  84/69/6295 3.3   Sodium (mmol/L)  Date Value  03/12/2023 139  03/28/2014 139    Assessment  Cameron [name] is a 62 y.o. male presenting with foot pain. PMH significant for CAD, HFrEF, PAD, gout. Pharmacy has been consulted to monitor and replace electrolytes.  Diet: NPO MIVF: NS @ 75 mL/hr Pertinent medications: spironolactone 12.5mg  daily  Goal of Therapy: Electrolytes WNL  Plan:  No replacement currently indicated Check BMP, Mg, Phos with AM labs  Thank you for allowing pharmacy to be a part of this patient's care.  Bettey Costa, PharmD Clinical Pharmacist 03/12/2023 10:25 AM

## 2023-03-12 NOTE — Plan of Care (Signed)
  Problem: Health Behavior/Discharge Planning: Goal: Ability to manage health-related needs will improve Outcome: Progressing   Problem: Clinical Measurements: Goal: Will remain free from infection Outcome: Progressing   Problem: Clinical Measurements: Goal: Respiratory complications will improve Outcome: Progressing   Problem: Clinical Measurements: Goal: Cardiovascular complication will be avoided Outcome: Progressing   Problem: Activity: Goal: Risk for activity intolerance will decrease Outcome: Progressing   Problem: Nutrition: Goal: Adequate nutrition will be maintained Outcome: Progressing   Problem: Pain Managment: Goal: General experience of comfort will improve Outcome: Progressing   Problem: Safety: Goal: Ability to remain free from injury will improve Outcome: Progressing

## 2023-03-12 NOTE — Discharge Instructions (Signed)
Some PCP options in [place] area- not a comprehensive list  [facility]- [phone] [place]- [phone] [facility]- [phone] [facility]- [phone] [facility]- [phone] [place]- [phone]  or [facility] [place] [facility] [phone]  

## 2023-03-12 NOTE — TOC CM/SW Note (Addendum)
Transition of Care [facility]) - Inpatient Brief Assessment   Patient Details  Name: Cameron [name] MRN: [id number] Date of Birth: [birth year]  Transition of Care [facility]) CM/SW Contact:    Chapman Fitch, RN Phone Number: 03/12/2023, 2:34 PM   Clinical Narrative:   Transition of Care [facility]) Screening Note   Patient Details  Name: Cameron [name] Date of Birth: 08/09/1960   Transition of Care [facility]) CM/SW Contact:    Chapman Fitch, RN Phone Number: 03/12/2023, 2:34 PM    Transition of Care Department [facility]) has reviewed patient and no TOC needs have been identified at this time. We will continue to monitor patient advancement through interdisciplinary progression rounds. If new patient transition needs arise, please place a TOC consult.   List of local PCP added to AVS  Per MD no toc needs for discharge  Transition of Care Asessment: Insurance and Status: Insurance coverage has been reviewed Patient has primary care physician: Yes     Prior/Current Home Services: No current home services Social Determinants of Health Reivew: SDOH reviewed no interventions necessary Readmission risk has been reviewed: Yes Transition of care needs: no transition of care needs at this time

## 2023-03-12 NOTE — Progress Notes (Signed)
Cameron [name] to be D/C'd Home per MD order.  Discussed prescriptions and follow up appointments with the patient. Prescriptions given to patient, medication list explained in detail. Pt verbalized understanding.  Allergies as of 03/12/2023   No Known Allergies      Medication List     TAKE these medications    acetaminophen 325 MG tablet Commonly known as: TYLENOL Take 2 tablets (650 mg total) by mouth every 6 (six) hours as needed for moderate pain, mild pain, fever or headache. What changed:  medication strength how much to take when to take this reasons to take this   allopurinol 100 MG tablet Commonly known as: ZYLOPRIM Take 100 mg by mouth daily.   aspirin EC 81 MG tablet Take 1 tablet (81 mg total) by mouth daily. Swallow whole. Start taking on: March 13, 2023   atorvastatin 80 MG tablet Commonly known as: LIPITOR Take 1 tablet (80 mg total) by mouth daily. What changed: how much to take   colchicine 0.6 MG tablet Take 1 tablet (0.6 mg total) by mouth daily.   Eliquis 5 MG Tabs tablet Generic drug: apixaban Take 1 tablet by mouth 2 (two) times daily.   Farxiga 10 MG Tabs tablet Generic drug: dapagliflozin propanediol Take 10 mg by mouth daily.   furosemide 40 MG tablet Commonly known as: LASIX Take 1 tablet (40 mg total) by mouth 2 (two) times daily.   hydrocortisone 25 MG suppository Commonly known as: ANUSOL-HC One suppository twice a day as needed for hemorrhoids.  (Generic okay to substitute)   ibuprofen 200 MG tablet Commonly known as: ADVIL Take 200 mg by mouth every 6 (six) hours as needed for headache or mild pain.   metoprolol succinate 25 MG 24 hr tablet Commonly known as: TOPROL-XL Take 0.5 tablets (12.5 mg total) by mouth at bedtime.   nicotine 14 mg/24hr patch Commonly known as: NICODERM CQ - dosed in mg/24 hours One 14 mg patch chest wall daily as needed for nicotine craving.  (Can substitute generic)   nitroGLYCERIN 0.4 MG SL  tablet Commonly known as: NITROSTAT Place under the tongue.   oxyCODONE 5 MG immediate release tablet Commonly known as: Oxy IR/ROXICODONE Take 1 tablet (5 mg total) by mouth every 6 (six) hours as needed for severe pain or moderate pain.   pantoprazole 40 MG tablet Commonly known as: Protonix Take 1 tablet (40 mg total) by mouth daily.   spironolactone 25 MG tablet Commonly known as: ALDACTONE Take 0.5 tablets (12.5 mg total) by mouth daily. Start taking on: March 13, 2023 What changed:  how much to take when to take this        Vitals:   03/12/23 0207 03/12/23 0818  BP: 121/68 99/67  Pulse: 73 71  Resp: 16 14  Temp: 98.4 F (36.9 C) 98.2 F (36.8 C)  SpO2: 95% 95%    Skin clean, dry and intact without evidence of skin break down, no evidence of skin tears noted. IV catheter discontinued intact. Site without signs and symptoms of complications. Dressing and pressure applied. Pt denies pain at this time. No complaints noted.  An After Visit Summary was printed and given to the patient. Patient escorted via WC, and D/C home via private auto.  Cameron [name] C. Jilda Roche

## 2023-03-12 NOTE — Discharge Summary (Signed)
Physician Discharge Summary   Patient: Cameron [name] MRN: [id number]  DOB: [birth year]   Admit:     Date of Admission: 03/10/2023 Admitted from: home   Discharge: Date of discharge: 03/12/23 Disposition: Home Condition at discharge: good  CODE STATUS: FULL CODE     Discharge Physician: Sunnie Nielsen, DO [facility]     PCP: Pcp, No  Recommendations for Outpatient Follow-up:  Follow up with vascular surgery in 3 weeks for ABI and hospital follow-up Follow w/ PCP in next month or so     Discharge Instructions     Discharge patient   Complete by: As directed    Discharge disposition: 01-Home or Self Care   Discharge patient date: 03/12/2023         Discharge Diagnoses: Principal Problem:   Ischemia of foot Active Problems:   Essential hypertension   Hyperlipidemia   CAD (coronary artery disease), native coronary artery   Hypokalemia   New onset atrial fibrillation (HCC)   Tobacco abuse   Gout_left elbow   CKD (chronic kidney disease), stage II   HFrEF (heart failure with reduced ejection fraction) (HCC)   Limb ischemia       Hospital Course: Cameron [name] is a 62 y.o. male with medical history significant of CAD, HFrEF, hypertension hyperlipidemia, PAD, gout presenting w/ worsening burning pain LLE as well as cool toes. Pain is worse with ambulation.  History of peripheral artery disease status post stenting of left lower extremity in 2021. Patient reports overall stable disease since stenting. On Eliquis in the setting of atrial fibrillation per report. Patient does report missing some doses at times.  09/03: To ED. Admitted w/ limb ischemia. Started heparin gtt. Vascular consulted.  09/04: angiography, thrombectomy, balloon angioplasty and stenting today  09/05: off heparin, ok to discharge home on eliquis + ASA and follow w/ Dr Wyn Quaker in office in 3 weeks   Consultants:  Vascular Surgery   Procedures: 03/11/23 LLE  angio      ASSESSMENT & PLAN:  Limb ischemia Peripheral Arterial Disease s/p previous stenting LLE 2021, s/p stenting LLE this admission  Heparin drip --> eliquis + ASA High potency statin  Pain control Follow in 3 weeks w/ vascular surgery office    HFrEF (heart failure with reduced ejection fraction) (HCC) 2D echo January 2024 with a EF of 40 to 45% and grade 2 diastolic dysfunction Appears euvolemic Cont home regimen including metoprolol, aldactone  Restart lasix on discharge    CKD (chronic kidney disease), stage II Creatinine 1.7 with GFR greater than 60 Monitor BMP outpatient    Gout - left elbow Stable  Cont allopurinol and colchicine    Tobacco abuse 1 pack/day smoker cessation has been discussed at length Nicotine patch   Atrial fibrillation  Rate controlled at present Continue home regimen including beta-blocker ASA + Eliquis per vascular     Hypokalemia Potassium 2.5 corrected Follow BMP outpatient    CAD (coronary artery disease), native coronary artery HTN HLD PAD Baseline CAD with NSTEMI and complex stenting 2006 and anterior STEMI/06/2016-s/p PCI with DES LAD  No active CP  Cont home regimen including statin - pt has been taking half dose, will need full dose / high potency                   Discharge Instructions  Allergies as of 03/12/2023   No Known Allergies      Medication List     TAKE these  medications    acetaminophen 325 MG tablet Commonly known as: TYLENOL Take 2 tablets (650 mg total) by mouth every 6 (six) hours as needed for moderate pain, mild pain, fever or headache. What changed:  medication strength how much to take when to take this reasons to take this   allopurinol 100 MG tablet Commonly known as: ZYLOPRIM Take 100 mg by mouth daily.   aspirin EC 81 MG tablet Take 1 tablet (81 mg total) by mouth daily. Swallow whole. Start taking on: March 13, 2023   atorvastatin 80 MG tablet Commonly  known as: LIPITOR Take 1 tablet (80 mg total) by mouth daily. What changed: how much to take   colchicine 0.6 MG tablet Take 1 tablet (0.6 mg total) by mouth daily.   Eliquis 5 MG Tabs tablet Generic drug: apixaban Take 1 tablet by mouth 2 (two) times daily.   Farxiga 10 MG Tabs tablet Generic drug: dapagliflozin propanediol Take 10 mg by mouth daily.   furosemide 40 MG tablet Commonly known as: LASIX Take 1 tablet (40 mg total) by mouth 2 (two) times daily.   hydrocortisone 25 MG suppository Commonly known as: ANUSOL-HC One suppository twice a day as needed for hemorrhoids.  (Generic okay to substitute)   ibuprofen 200 MG tablet Commonly known as: ADVIL Take 200 mg by mouth every 6 (six) hours as needed for headache or mild pain.   metoprolol succinate 25 MG 24 hr tablet Commonly known as: TOPROL-XL Take 0.5 tablets (12.5 mg total) by mouth at bedtime.   nicotine 14 mg/24hr patch Commonly known as: NICODERM CQ - dosed in mg/24 hours One 14 mg patch chest wall daily as needed for nicotine craving.  (Can substitute generic)   nitroGLYCERIN 0.4 MG SL tablet Commonly known as: NITROSTAT Place under the tongue.   oxyCODONE 5 MG immediate release tablet Commonly known as: Oxy IR/ROXICODONE Take 1 tablet (5 mg total) by mouth every 6 (six) hours as needed for severe pain or moderate pain.   pantoprazole 40 MG tablet Commonly known as: Protonix Take 1 tablet (40 mg total) by mouth daily.   spironolactone 25 MG tablet Commonly known as: ALDACTONE Take 0.5 tablets (12.5 mg total) by mouth daily. Start taking on: March 13, 2023 What changed:  how much to take when to take this         Follow-up Information     Dew, Marlow Baars, MD. Schedule an appointment as soon as possible for a visit in 3 week(s).   Specialties: Vascular Surgery, Radiology, Interventional Cardiology Why: ABI and hospital follow-up Contact information: [address] [place] [address] [phone]                 No Known Allergies   Subjective: pt reports feeling ok this morning, walking well, pain controlled, he is pleased that this foot is finally warm   Discharge Exam: BP 99/67 (BP Location: Left Arm)   Pulse 71   Temp 98.2 F (36.8 C) (Oral)   Resp 14   Ht 5\' 7"  (1.702 m)   Wt 82.6 kg   SpO2 95%   BMI 28.52 kg/m  General: Pt is alert, awake, not in acute distress Cardiovascular: RRR, S1/S2 +, no rubs, no gallops Respiratory: CTA bilaterally, no wheezing, no rhonchi Abdominal: Soft, NT, ND, bowel sounds + Extremities: no edema, no cyanosis     The results of significant diagnostics from this hospitalization (including imaging, microbiology, ancillary and laboratory) are listed  below for reference.     Microbiology: No results found for this or any previous visit (from the past 240 hour(s)).   Labs: BNP (last 3 results) Recent Labs    07/17/22 0936 07/19/22 0522  BNP 1,911.6* 1,577.8*   Basic Metabolic Panel: Recent Labs  Lab 03/10/23 0916 03/10/23 1651 03/11/23 0716 03/12/23 0601  NA 138 138 137 139  K 2.5* 3.2* 3.7 4.0  CL 102 102 107 108  CO2 26 29 25 26   GLUCOSE 96 101* 93 93  BUN 21 18 18 14   CREATININE 1.28* 1.27* 1.11 1.11  CALCIUM 8.9 8.6* 8.2* 8.7*  MG 1.8  --  1.9  --   PHOS  --   --  3.3  --    Liver Function Tests: Recent Labs  Lab 03/10/23 0916  AST 24  ALT 14  ALKPHOS 91  BILITOT 0.2*  PROT 7.2  ALBUMIN 4.3   No results for input(s): "LIPASE", "AMYLASE" in the last 168 hours. No results for input(s): "AMMONIA" in the last 168 hours. CBC: Recent Labs  Lab 03/10/23 0916 03/11/23 0716 03/12/23 0601  WBC 6.8 5.7 6.1  HGB 11.9* 10.2* 9.9*  HCT 38.8* 33.1* 32.8*  MCV 93.9 93.8 94.8  PLT 161 144* 148*   Cardiac Enzymes: No results for input(s): "CKTOTAL", "CKMB", "CKMBINDEX", "TROPONINI" in the last 168 hours. BNP: Invalid input(s): "POCBNP" CBG: No results for  input(s): "GLUCAP" in the last 168 hours. D-Dimer No results for input(s): "DDIMER" in the last 72 hours. Hgb A1c No results for input(s): "HGBA1C" in the last 72 hours. Lipid Profile No results for input(s): "CHOL", "HDL", "LDLCALC", "TRIG", "CHOLHDL", "LDLDIRECT" in the last 72 hours. Thyroid function studies No results for input(s): "TSH", "T4TOTAL", "T3FREE", "THYROIDAB" in the last 72 hours.  Invalid input(s): "FREET3" Anemia work up No results for input(s): "VITAMINB12", "FOLATE", "FERRITIN", "TIBC", "IRON", "RETICCTPCT" in the last 72 hours. Urinalysis    Component Value Date/Time   COLORURINE RED (A) 07/25/2022 1140   APPEARANCEUR CLOUDY (A) 07/25/2022 1140   APPEARANCEUR Hazy 11/20/2011 1343   LABSPEC 1.017 07/25/2022 1140   LABSPEC 1.026 11/20/2011 1343   PHURINE  07/25/2022 1140    TEST NOT REPORTED DUE TO COLOR INTERFERENCE OF URINE PIGMENT   GLUCOSEU (A) 07/25/2022 1140    TEST NOT REPORTED DUE TO COLOR INTERFERENCE OF URINE PIGMENT   GLUCOSEU Negative 11/20/2011 1343   HGBUR (A) 07/25/2022 1140    TEST NOT REPORTED DUE TO COLOR INTERFERENCE OF URINE PIGMENT   BILIRUBINUR (A) 07/25/2022 1140    TEST NOT REPORTED DUE TO COLOR INTERFERENCE OF URINE PIGMENT   BILIRUBINUR Negative 11/20/2011 1343   KETONESUR (A) 07/25/2022 1140    TEST NOT REPORTED DUE TO COLOR INTERFERENCE OF URINE PIGMENT   PROTEINUR (A) 07/25/2022 1140    TEST NOT REPORTED DUE TO COLOR INTERFERENCE OF URINE PIGMENT   NITRITE (A) 07/25/2022 1140    TEST NOT REPORTED DUE TO COLOR INTERFERENCE OF URINE PIGMENT   LEUKOCYTESUR (A) 07/25/2022 1140    TEST NOT REPORTED DUE TO COLOR INTERFERENCE OF URINE PIGMENT   LEUKOCYTESUR Negative 11/20/2011 1343   Sepsis Labs Recent Labs  Lab 03/10/23 0916 03/11/23 0716 03/12/23 0601  WBC 6.8 5.7 6.1   Microbiology No results found for this or any previous visit (from the past 240 hour(s)). Imaging PERIPHERAL VASCULAR CATHETERIZATION  Result Date:  03/11/2023 See surgical note for result.  US ARTERIAL ABI (SCREENING LOWER EXTREMITY)  Result Date: 03/10/2023 CLINICAL DATA:  Hypertension Hyperlipidemia CAD Current smoker Foot ischemia EXAM: NONINVASIVE PHYSIOLOGIC VASCULAR STUDY OF BILATERAL LOWER EXTREMITIES TECHNIQUE: Evaluation of both lower extremities were performed at rest, including calculation of ankle-brachial indices with single level pressure measurements and doppler recording. COMPARISON:  None available. FINDINGS: Right ABI:  0.93 Left ABI:  0.51 Right Lower Extremity: Posterior tibial and dorsalis pedis artery waveforms are monophasic. Left Lower Extremity: Posterior tibial dorsalis pedis artery waveforms are monophasic. 0.5-0.79 Moderate PAD IMPRESSION: Findings consistent with moderate left and mild right lower extremity arterial occlusive disease. Electronically Signed   By: Acquanetta Belling M.D.   On: 03/10/2023 11:33      Time coordinating discharge: over 30 minutes  SIGNED:  Sunnie Nielsen DO [facility]

## 2023-03-16 NOTE — Group Note (Deleted)

## 2023-04-08 ENCOUNTER — Other Ambulatory Visit: Payer: Self-pay

## 2023-04-08 ENCOUNTER — Encounter: Payer: Self-pay | Admitting: Emergency Medicine

## 2023-04-08 ENCOUNTER — Emergency Department
Admission: EM | Admit: 2023-04-08 | Discharge: 2023-04-08 | Disposition: A | Discharge status: Home / Self Care | Payer: BLUE CROSS/BLUE SHIELD | Attending: Emergency Medicine | Admitting: Emergency Medicine

## 2023-04-08 DIAGNOSIS — I10 Essential (primary) hypertension: Secondary | ICD-10-CM | POA: Diagnosis not present

## 2023-04-08 DIAGNOSIS — M109 Gout, unspecified: Secondary | ICD-10-CM | POA: Diagnosis not present

## 2023-04-08 DIAGNOSIS — M2550 Pain in unspecified joint: Secondary | ICD-10-CM | POA: Diagnosis present

## 2023-04-08 MED ORDER — LIDOCAINE HCL (PF) 1 % IJ SOLN
5.0000 mL | Freq: Once | INTRAMUSCULAR | Status: AC
  Administered 2023-04-08: 5 mL
  Filled 2023-04-08: qty 5

## 2023-04-08 MED ORDER — OXYCODONE-ACETAMINOPHEN 5-325 MG PO TABS
1.0000 | ORAL_TABLET | ORAL | 0 refills | Status: DC | PRN
Start: 2023-04-08 — End: 2024-05-26

## 2023-04-08 MED ORDER — KETOROLAC TROMETHAMINE 60 MG/2ML IM SOLN
30.0000 mg | Freq: Once | INTRAMUSCULAR | Status: AC
  Administered 2023-04-08: 30 mg via INTRAMUSCULAR
  Filled 2023-04-08: qty 2

## 2023-04-08 MED ORDER — DEXAMETHASONE SODIUM PHOSPHATE 10 MG/ML IJ SOLN
10.0000 mg | Freq: Once | INTRAMUSCULAR | Status: AC
  Administered 2023-04-08: 10 mg via INTRAMUSCULAR
  Filled 2023-04-08: qty 1

## 2023-04-08 MED ORDER — PREDNISONE 10 MG PO TABS: 10.0000 mg | ORAL_TABLET | Freq: Every day | ORAL | 0 refills | Status: DC

## 2023-04-08 NOTE — ED Notes (Signed)
Patient given meds and discharge instructions including prescriptions x2 with importance of follow up appt as needed. Patient stable and ambulatory with steady even gait on dispo.

## 2023-04-08 NOTE — ED Triage Notes (Signed)
Patient ambulatory to triage with steady gait, without difficulty or distress noted; pt reports pain/swelling to rt elbow, left index finger, and left knee due to gout; currently taking colchicine and allopurinol without relief

## 2023-04-08 NOTE — Discharge Instructions (Addendum)
Please follow-up with your doctor regarding your likely gout flare.  Return to the emergency department for any significant worsening pain, fever, or any other symptom personally concerning to yourself.

## 2023-04-08 NOTE — ED Provider Notes (Addendum)
[facility] Provider Note    Event Date/Time   First MD Initiated Contact with Patient 04/08/23 [phone]     (approximate)  History   Chief Complaint: Joint Pain  HPI  Cameron [name] is a 62 y.o. male with a past medical history of hypertension, hyperlipidemia, gout, presents to the emergency department for joint pain.  According to the patient for the past 10 days or so he has been experiencing a gout flare.  Patient has a history of polyarthropathy due to gout in the past, states this flare started in his left index finger which appears to be improving however he now has swelling of the right elbow and is just begun to experience some pain in the left knee.  Patient states this is very typical of his gout flares to affected multiple joints at once.  He states last gout flare affected both knees and his left elbow.  Patient denies any fever.  Patient completed 5 days of prednisone 4 days ago states he felt like he was trying to get better but now it has got worse once again.  His doctor put him on colchicine 2 days ago which she has been taking but states no relief so he came to the emergency department.  Physical Exam   Triage Vital Signs: ED Triage Vitals  Encounter Vitals Group     BP 04/08/23 0552 124/72     Systolic BP Percentile --      Diastolic BP Percentile --      Pulse Rate 04/08/23 0552 (!) 122     Resp 04/08/23 0552 20     Temp 04/08/23 0552 97.8 F (36.6 C)     Temp src --      SpO2 04/08/23 0552 99 %     Weight 04/08/23 0549 180 lb (81.6 kg)     Height 04/08/23 0549 5\' 7"  (1.702 m)     Head Circumference --      Peak Flow --      Pain Score 04/08/23 0549 8     Pain Loc --      Pain Education --      Exclude from Growth Chart --     Most recent vital signs: Vitals:   04/08/23 0552  BP: 124/72  Pulse: (!) 122  Resp: 20  Temp: 97.8 F (36.6 C)  SpO2: 99%    General: Awake, no distress.  CV:  Good peripheral perfusion.  Regular  rate and rhythm  Resp:  Normal effort.  Equal breath sounds bilaterally.  Abd:  No distention.   Other:  Mild swelling of the left index finger PIP joint, moderate right elbow swelling most consistent with bursitis/bursal inflammation.  Good range of motion in both knees.  No swelling noted to the knees.   ED Results / Procedures / Treatments   MEDICATIONS ORDERED IN ED: Medications  lidocaine (PF) (XYLOCAINE) 1 % injection 5 mL (has no administration in time range)  dexamethasone (DECADRON) injection 10 mg (has no administration in time range)  ketorolac (TORADOL) injection 30 mg (has no administration in time range)     IMPRESSION / MDM / ASSESSMENT AND PLAN / ED COURSE  I reviewed the triage vital signs and the nursing notes.  Patient's presentation is most consistent with acute illness / injury with system symptoms.  Patient presents emergency department for multiple joint pains consistent with likely gout flare.  Patient has a history of gout flares similar to this in  the past.  We will drain the patient's right elbow bursa to help with pain relief, we will dose IM Decadron and Toradol in the emergency department.  Patient states his main concern is he was scheduled to go back to work today and does not believe he can do so so he came to the emergency department.  Will provide a work note for the patient for the next 3 days.  I believe it be prudent to start the patient back on prednisone as this appear to be helping the patient we will put him on a 12-day taper of prednisone as well as pain medication to be used if needed.  Current pulse rate remains in the 80s throughout my evaluation (initial triage rate was 122).  Patient is agreeable to plan of care.  ARTHOCENTESIS Performed by: Minna Antis Consent: Verbal consent obtained. Risks and benefits: risks, benefits and alternatives were discussed Consent given by: patient Required items: required blood products, implants,  devices, and special equipment available Patient identity confirmed: verbally with patient Time out: Immediately prior to procedure a "time out" was called to verify the correct patient, procedure, equipment, support staff and site/side marked as required. Indications: Right elbow bursa swelling and discomfort Joint: Right elbow Local anesthesia used: 1% Xylocaine Preparation: Patient was prepped and draped in the usual sterile fashion. Aspirate appearance: Yellow Aspirate amount: 8 ml Patient tolerance: Patient tolerated the procedure well with no immediate complications.    FINAL CLINICAL IMPRESSION(S) / ED DIAGNOSES   Gout   Note:  This document was prepared using Dragon voice recognition software and may include unintentional dictation errors.   Minna Antis, MD 04/08/23 0600    Minna Antis, MD 04/08/23 [phone]

## 2023-07-21 DIAGNOSIS — E7800 Pure hypercholesterolemia, unspecified: Secondary | ICD-10-CM | POA: Diagnosis not present

## 2023-07-21 DIAGNOSIS — I513 Intracardiac thrombosis, not elsewhere classified: Secondary | ICD-10-CM | POA: Diagnosis not present

## 2023-07-21 DIAGNOSIS — I5022 Chronic systolic (congestive) heart failure: Secondary | ICD-10-CM | POA: Diagnosis not present

## 2023-07-21 DIAGNOSIS — I480 Paroxysmal atrial fibrillation: Secondary | ICD-10-CM | POA: Diagnosis not present

## 2023-07-21 DIAGNOSIS — I25118 Atherosclerotic heart disease of native coronary artery with other forms of angina pectoris: Secondary | ICD-10-CM | POA: Diagnosis not present

## 2023-07-21 DIAGNOSIS — I10 Essential (primary) hypertension: Secondary | ICD-10-CM | POA: Diagnosis not present

## 2024-04-10 LAB — COLOGUARD: COLOGUARD: NEGATIVE

## 2024-04-28 ENCOUNTER — Encounter (INDEPENDENT_AMBULATORY_CARE_PROVIDER_SITE_OTHER)

## 2024-04-28 ENCOUNTER — Encounter (INDEPENDENT_AMBULATORY_CARE_PROVIDER_SITE_OTHER): Admitting: Nurse Practitioner

## 2024-05-02 ENCOUNTER — Other Ambulatory Visit (INDEPENDENT_AMBULATORY_CARE_PROVIDER_SITE_OTHER): Payer: Self-pay | Admitting: Vascular Surgery

## 2024-05-02 DIAGNOSIS — I739 Peripheral vascular disease, unspecified: Secondary | ICD-10-CM

## 2024-05-03 ENCOUNTER — Encounter (INDEPENDENT_AMBULATORY_CARE_PROVIDER_SITE_OTHER): Admitting: Nurse Practitioner

## 2024-05-03 ENCOUNTER — Encounter (INDEPENDENT_AMBULATORY_CARE_PROVIDER_SITE_OTHER)

## 2024-05-21 ENCOUNTER — Encounter: Payer: Self-pay | Admitting: Emergency Medicine

## 2024-05-21 ENCOUNTER — Encounter: Payer: Self-pay | Admitting: Intensive Care

## 2024-05-21 ENCOUNTER — Ambulatory Visit
Admission: EM | Admit: 2024-05-21 | Discharge: 2024-05-21 | Disposition: A | Discharge status: Home / Self Care | Attending: Family Medicine | Admitting: Family Medicine

## 2024-05-21 ENCOUNTER — Emergency Department
Admission: EM | Admit: 2024-05-21 | Discharge: 2024-05-21 | Disposition: A | Discharge status: Home / Self Care | Attending: Emergency Medicine | Admitting: Emergency Medicine

## 2024-05-21 ENCOUNTER — Other Ambulatory Visit: Payer: Self-pay

## 2024-05-21 ENCOUNTER — Emergency Department

## 2024-05-21 DIAGNOSIS — K469 Unspecified abdominal hernia without obstruction or gangrene: Secondary | ICD-10-CM | POA: Diagnosis not present

## 2024-05-21 DIAGNOSIS — R319 Hematuria, unspecified: Secondary | ICD-10-CM

## 2024-05-21 DIAGNOSIS — I509 Heart failure, unspecified: Secondary | ICD-10-CM | POA: Insufficient documentation

## 2024-05-21 DIAGNOSIS — I110 Hypertensive heart disease with heart failure: Secondary | ICD-10-CM | POA: Diagnosis not present

## 2024-05-21 DIAGNOSIS — R310 Gross hematuria: Secondary | ICD-10-CM | POA: Insufficient documentation

## 2024-05-21 DIAGNOSIS — I2510 Atherosclerotic heart disease of native coronary artery without angina pectoris: Secondary | ICD-10-CM | POA: Insufficient documentation

## 2024-05-21 LAB — URINALYSIS, ROUTINE W REFLEX MICROSCOPIC
Bacteria, UA: NONE SEEN
RBC / HPF: >50 RBC/hpf (ref 0–5)
Squamous Epithelial / HPF: 0 /HPF (ref 0–5)

## 2024-05-21 LAB — POCT URINE DIPSTICK
Glucose, UA: 100 mg/dL — AB
Nitrite, UA: NEGATIVE
POC PROTEIN,UA: >=300 — AB
Spec Grav, UA: <=1.005 — AB (ref 1.010–1.025)
Urobilinogen, UA: >=8 U/dL — AB
pH, UA: 8.5 — AB (ref 5.0–8.0)

## 2024-05-21 LAB — HEPATIC FUNCTION PANEL
ALT: 27 U/L (ref 0–44)
AST: 28 U/L (ref 15–41)
Albumin: 4.4 g/dL (ref 3.5–5.0)
Alkaline Phosphatase: 129 U/L — ABNORMAL HIGH (ref 38–126)
Bilirubin, Direct: 0.2 mg/dL (ref 0.0–0.2)
Indirect Bilirubin: 0.2 mg/dL — ABNORMAL LOW (ref 0.3–0.9)
Total Bilirubin: 0.4 mg/dL (ref 0.0–1.2)
Total Protein: 7.5 g/dL (ref 6.5–8.1)

## 2024-05-21 LAB — CBC
HCT: 46.9 % (ref 39.0–52.0)
Hemoglobin: 15.6 g/dL (ref 13.0–17.0)
MCH: 31.2 pg (ref 26.0–34.0)
MCHC: 33.3 g/dL (ref 30.0–36.0)
MCV: 93.8 fL (ref 80.0–100.0)
Platelets: 130 K/uL — ABNORMAL LOW (ref 150–400)
RBC: 5 MIL/uL (ref 4.22–5.81)
RDW: 15.9 % — ABNORMAL HIGH (ref 11.5–15.5)
WBC: 12.2 K/uL — ABNORMAL HIGH (ref 4.0–10.5)
nRBC: 0 % (ref 0.0–0.2)

## 2024-05-21 LAB — BASIC METABOLIC PANEL WITH GFR
Anion gap: 11 (ref 5–15)
BUN: 16 mg/dL (ref 8–23)
CO2: 25 mmol/L (ref 22–32)
Calcium: 9.3 mg/dL (ref 8.9–10.3)
Chloride: 100 mmol/L (ref 98–111)
Creatinine, Ser: 1.38 mg/dL — ABNORMAL HIGH (ref 0.61–1.24)
GFR, Estimated: 58 mL/min — ABNORMAL LOW (ref >60)
Glucose, Bld: 115 mg/dL — ABNORMAL HIGH (ref 70–99)
Potassium: 3.7 mmol/L (ref 3.5–5.1)
Sodium: 137 mmol/L (ref 135–145)

## 2024-05-21 MED ORDER — CIPROFLOXACIN HCL 500 MG PO TABS: 500.0000 mg | ORAL_TABLET | Freq: Two times a day (BID) | ORAL | 0 refills | Status: AC

## 2024-05-21 NOTE — ED Notes (Signed)
 Patient is being discharged from the Urgent Care and sent to the Emergency Department via POV . Per Myla Poisson, NP, patient is in need of higher level of care due to Hematuria. Patient is aware and verbalizes understanding of plan of care.  Vitals:   05/21/24 0820  BP: (!) 142/94  Pulse: 73  Resp: 16  Temp: 98.1 F (36.7 C)  SpO2: 97%

## 2024-05-21 NOTE — ED Provider Notes (Signed)
 [facility] Provider Note    Event Date/Time   First MD Initiated Contact with Patient 05/21/24 1109     (approximate)   History   Hematuria   HPI  Cameron [name] is a 63 y.o. male history of hypertension, STEMI, CAD, CHF, hyperlipidemia, PAD, kidney stone presents emergency department with hematuria.  Patient states started on Thursday.  Patient states that he is getting large clots that are blocking the urethra and is very difficult to urinate.  Does not feel like he is able to empty his bladder.  Denies fever or chills.  Seen in urgent care and sent to the ED.  Similar symptoms in the past and was placed on Cipro  and symptoms resolved      Physical Exam   Triage Vital Signs: ED Triage Vitals [05/21/24 1040]  Encounter Vitals Group     BP 133/84     Girls Systolic BP Percentile      Girls Diastolic BP Percentile      Boys Systolic BP Percentile      Boys Diastolic BP Percentile      Pulse Rate 79     Resp 15     Temp 98.3 F (36.8 C)     Temp Source Oral     SpO2 98 %     Weight 175 lb (79.4 kg)     Height 5' 7 (1.702 m)     Head Circumference      Peak Flow      Pain Score 0     Pain Loc      Pain Education      Exclude from Growth Chart     Most recent vital signs: Vitals:   05/21/24 1040 05/21/24 1130  BP: 133/84   Pulse: 79   Resp: 15   Temp: 98.3 F (36.8 C)   SpO2: 98% 98%     General: Awake, no distress.   CV:  Good peripheral perfusion. Resp:  Normal effort.  Abd:  No distention.  Nontender, hernia noted left lower quadrant, not incarcerated or entrapped Other:      ED Results / Procedures / Treatments   Labs (all labs ordered are listed, but only abnormal results are displayed) Labs Reviewed  URINALYSIS, ROUTINE W REFLEX MICROSCOPIC - Abnormal; Notable for the following components:      Result Value   Color, Urine RED (*)    APPearance TURBID (*)    Glucose, UA   (*)    Value: TEST NOT REPORTED DUE TO  COLOR INTERFERENCE OF URINE PIGMENT   Hgb urine dipstick   (*)    Value: TEST NOT REPORTED DUE TO COLOR INTERFERENCE OF URINE PIGMENT   Bilirubin Urine   (*)    Value: TEST NOT REPORTED DUE TO COLOR INTERFERENCE OF URINE PIGMENT   Ketones, ur   (*)    Value: TEST NOT REPORTED DUE TO COLOR INTERFERENCE OF URINE PIGMENT   Protein, ur   (*)    Value: TEST NOT REPORTED DUE TO COLOR INTERFERENCE OF URINE PIGMENT   Nitrite   (*)    Value: TEST NOT REPORTED DUE TO COLOR INTERFERENCE OF URINE PIGMENT   Leukocytes,Ua   (*)    Value: TEST NOT REPORTED DUE TO COLOR INTERFERENCE OF URINE PIGMENT   All other components within normal limits  BASIC METABOLIC PANEL WITH GFR - Abnormal; Notable for the following components:   Glucose, Bld 115 (*)    Creatinine, Ser  1.38 (*)    GFR, Estimated 58 (*)    All other components within normal limits  CBC - Abnormal; Notable for the following components:   WBC 12.2 (*)    RDW 15.9 (*)    Platelets 130 (*)    All other components within normal limits  HEPATIC FUNCTION PANEL - Abnormal; Notable for the following components:   Alkaline Phosphatase 129 (*)    Indirect Bilirubin 0.2 (*)    All other components within normal limits     EKG     RADIOLOGY CT renal stone, bladder scan    PROCEDURES:   Procedures  Critical Care:  no Chief Complaint  Patient presents with   Hematuria      MEDICATIONS ORDERED IN ED: Medications - No data to display   IMPRESSION / MDM / ASSESSMENT AND PLAN / ED COURSE  I reviewed the triage vital signs and the nursing notes.                              Differential diagnosis includes, but is not limited to, hematuria, kidney stone, UTI, bladder cancer  Patient's presentation is most consistent with acute illness / injury with system symptoms.   Labs, imaging ordered  Labs are reassuring except for the urinalysis, lab was unable to test for most of the UA secondary to it being full of blood.  Greater  than 50 RBCs, no bacteria noted.  CT renal stone study independent review and interpretation for possible mass in the bladder versus blood clot, see report below  1. No acute findings in the abdomen/pelvis. No evidence of  nephrolithiasis or hydronephrosis.  2. Moderate increased density along the dependent portion of the  bladder which may be due to hemorrhagic debris/clot versus mass.  Recommend clinical correlation and consider cystoscopy for further  evaluation if this persists.  3. Minimal colonic diverticulosis without active inflammation.  4. Moderate size ventral hernia left of the umbilicus containing  only peritoneal fat.  5. Aortic atherosclerosis.  6. Avascular necrosis of the femoral heads bilaterally unchanged.    Aortic Atherosclerosis (ICD10-I70.0).     I did offer the patient Foley catheter, we could do irrigation to clear the clots, patient would like to try and urinate again at this time as he had less difficulty earlier when he had a urine sample here.  Second option is to take antibiotics, return if worsening, and follow-up with urology  Patient was able to urinate, however he states he did pass a lot of clots again.  At this time he is opting to go home with antibiotics and follow-up with urology.  Strict instructions to return if he is worsening.  No red flags to warrant admission at this time.  He is in agreement treatment plan.  Discharged stable condition.   FINAL CLINICAL IMPRESSION(S) / ED DIAGNOSES   Final diagnoses:  Gross hematuria     Rx / DC Orders   ED Discharge Orders          Ordered    ciprofloxacin  (CIPRO ) 500 MG tablet  2 times daily        05/21/24 1307             Note:  This document was prepared using Dragon voice recognition software and may include unintentional dictation errors.    Gasper Devere ORN, PA-C 05/21/24 1309    Waymond Lorelle Cummins, MD 05/21/24 [phone]

## 2024-05-21 NOTE — ED Triage Notes (Signed)
 Patient reports blood in urine that started Thursday evening. Reports clots and becoming more difficult to urinate.

## 2024-05-21 NOTE — ED Triage Notes (Addendum)
 Pt c/o hematuria. Started about 2 days ago. He states he was passing blood clots yesterday. He states his flow starts and then stops and he has to push to urinate. Denies pain. He states he had something similar in April and was given an antibiotic (cipro ) and helped within 2 days.   Pt is taking Eliquis .

## 2024-05-21 NOTE — Discharge Instructions (Addendum)
 Please go to the ER for further evaluation of your painless blood /blood clots in your urine

## 2024-05-21 NOTE — ED Provider Notes (Signed)
 [facility]    CSN: [id number] Arrival date & time: 05/21/24  0804      History   Chief Complaint Chief Complaint  Patient presents with   Hematuria    HPI Cameron [name] is a 63 y.o. male with a past medical history of CHF, CAD, hyperlipidemia, hypertension, PAD presenting for hematuria.  Patient reports 2 days of persistent hematuria with clots.  States the clots make it difficult for him to start his urine stream.  He denies any dysuria, fevers, vomiting, flank pain, frequency or urgency.  Denies any history of BPH.  States he has had similar symptoms in the past and was given Cipro  and states that resolved with that treatment but urine culture was negative.  He was instructed to follow-up with urology but has not done so.  He does take Eliquis  but states he did not take it today due to his symptoms.   Hematuria    Past Medical History:  Diagnosis Date   CHF (congestive heart failure) (HCC)    Coronary artery disease    a. 12/17 PCI with DES to pLAD with resdiual disease (RX therapy)   Hyperlipidemia    Hypertension    PAD (peripheral artery disease)    STEMI (ST elevation myocardial infarction) (HCC) 2017    Patient Active Problem List   Diagnosis Date Noted   Ischemia of foot 03/10/2023   HFrEF (heart failure with reduced ejection fraction) (HCC) 03/10/2023   Limb ischemia 03/10/2023   Gross hematuria 07/25/2022   Hemorrhoids 07/25/2022   Acute calculous cholecystitis 03/31/2021   Dry gangrene (HCC)    Cellulitis of left foot 08/17/2020   Pressure injury of skin 07/24/2020   Acute gouty arthritis 07/24/2020   Chronic systolic CHF (congestive heart failure) (HCC) 07/24/2020   Atrial fibrillation, chronic (HCC) 07/24/2020   Iron deficiency anemia 07/24/2020   Leukocytosis 07/24/2020   CKD (chronic kidney disease), stage II    Hypotension    Acute on chronic respiratory failure with hypoxia (HCC)    PVD (peripheral vascular disease)    Acute  renal failure superimposed on stage 3a chronic kidney disease (HCC)    Macrocytic anemia    Atherosclerosis of native arteries of extremity with rest pain (HCC) 06/19/2020   Acute on chronic systolic CHF (congestive heart failure) (HCC) 02/17/2020   Hypokalemia 02/17/2020   Hypomagnesemia 02/17/2020   Elevated troponin 02/17/2020   New onset atrial fibrillation (HCC) 02/17/2020   Olecranon bursitis of left elbow 02/17/2020   NSVT (nonsustained ventricular tachycardia) (HCC) 02/17/2020   Tobacco abuse 02/17/2020   Mural thrombus of atrium 02/17/2020   Gout_left elbow 02/17/2020   Bilateral leg edema 10/19/2018   Weight gain 10/19/2018   Chest pain 02/02/2018   Acute pain of right knee 03/13/2017   Essential hypertension 06/20/2016   Hyperlipidemia 06/20/2016   CAD (coronary artery disease), native coronary artery 06/20/2016   STEMI involving oth coronary artery of anterior wall (HCC) 06/17/2016   STEMI (ST elevation myocardial infarction) (HCC) 06/17/2016    Past Surgical History:  Procedure Laterality Date   CARDIAC CATHETERIZATION N/A 06/17/2016   Procedure: Left Heart Cath and Coronary Angiography;  Surgeon: Dorn JINNY Lesches, MD;  Location: [facility] INVASIVE CV LAB;  Service: Cardiovascular;  Laterality: N/A;   CARDIAC CATHETERIZATION N/A 06/17/2016   Procedure: Coronary Stent Intervention;  Surgeon: Dorn JINNY Lesches, MD;  Location: [facility] INVASIVE CV LAB;  Service: Cardiovascular;  Laterality: N/A;   cardiac stents     CERVICAL  SPINE SURGERY     LOWER EXTREMITY ANGIOGRAPHY Left 06/25/2020   Procedure: LOWER EXTREMITY ANGIOGRAPHY;  Surgeon: Marea Selinda RAMAN, MD;  Location: [facility] INVASIVE CV LAB;  Service: Cardiovascular;  Laterality: Left;   LOWER EXTREMITY ANGIOGRAPHY Left 03/11/2023   Procedure: Lower Extremity Angiography;  Surgeon: Marea Selinda RAMAN, MD;  Location: [facility] INVASIVE CV LAB;  Service: Cardiovascular;  Laterality: Left;       Home Medications    Prior to Admission medications    Medication Sig Start Date End Date Taking? Authorizing Provider  aspirin  EC 81 MG tablet Take 1 tablet (81 mg total) by mouth daily. Swallow whole. 03/13/23  Yes Alexander, Natalie, DO  atorvastatin  (LIPITOR ) 80 MG tablet Take 1 tablet (80 mg total) by mouth daily. Patient taking differently: Take 40 mg by mouth daily. 02/21/20  Yes Trudy Anthony HERO, MD  colchicine  0.6 MG tablet Take 1 tablet (0.6 mg total) by mouth daily. 07/27/22  Yes Wieting, Richard, MD  ELIQUIS  5 MG TABS tablet Take 1 tablet by mouth 2 (two) times daily. 12/22/22  Yes [provider]  furosemide  (LASIX ) 40 MG tablet Take 1 tablet (40 mg total) by mouth 2 (two) times daily. 07/26/22  Yes Wieting, Richard, MD  metoprolol  succinate (TOPROL -XL) 25 MG 24 hr tablet Take 0.5 tablets (12.5 mg total) by mouth at bedtime. 07/26/22  Yes Wieting, Richard, MD  spironolactone  (ALDACTONE ) 25 MG tablet Take 0.5 tablets (12.5 mg total) by mouth daily. 03/13/23  Yes Alexander, Natalie, DO  acetaminophen  (TYLENOL ) 325 MG tablet Take 2 tablets (650 mg total) by mouth every 6 (six) hours as needed for moderate pain, mild pain, fever or headache. 03/12/23   Alexander, Natalie, DO  FARXIGA  10 MG TABS tablet Take 10 mg by mouth daily.    [provider]  hydrocortisone  (ANUSOL -HC) 25 MG suppository One suppository twice a day as needed for hemorrhoids.  (Generic okay to substitute) 07/26/22   Josette Ade, MD  ibuprofen (ADVIL) 200 MG tablet Take 200 mg by mouth every 6 (six) hours as needed for headache or mild pain.    [provider]  nicotine  (NICODERM CQ  - DOSED IN MG/24 HOURS) 14 mg/24hr patch One 14 mg patch chest wall daily as needed for nicotine  craving.  (Can substitute generic) 03/12/23   Alexander, Natalie, DO  nitroGLYCERIN  (NITROSTAT ) 0.4 MG SL tablet Place under the tongue. 06/20/16   [provider]  oxyCODONE  (OXY IR/ROXICODONE ) 5 MG immediate release tablet Take 1 tablet (5 mg total) by mouth every 6  (six) hours as needed for severe pain or moderate pain. 03/12/23   Alexander, Natalie, DO  oxyCODONE -acetaminophen  (PERCOCET) 5-325 MG tablet Take 1 tablet by mouth every 4 (four) hours as needed for severe pain. 04/08/23   Dorothyann Drivers, MD  pantoprazole  (PROTONIX ) 40 MG tablet Take 1 tablet (40 mg total) by mouth daily. 07/26/22 08/25/22  Josette Ade, MD  predniSONE  (DELTASONE ) 10 MG tablet Take 1 tablet (10 mg total) by mouth daily. Day 1-3: take 4 tablets PO daily Day 4-6: take 3 tablets PO daily Day 7-9: take 2 tablets PO daily Day 10-12: take 1 tablet PO daily 04/08/23   Paduchowski, Kevin, MD  isosorbide  mononitrate (IMDUR ) 60 MG 24 hr tablet Take 1 tablet (60 mg total) by mouth daily. 02/04/18 05/30/19  Laurence Bridegroom, MD    Family History Family History  Problem Relation Age of Onset   CAD Paternal Grandfather    Breast cancer Mother    Parkinson's disease  Father    Alzheimer's disease Father    Hypertension Father    Diabetes Father     Social History Social History   Tobacco Use   Smoking status: Every Day    Current packs/day: 1.00    Types: Cigarettes   Smokeless tobacco: Never  Vaping Use   Vaping status: Never Used  Substance Use Topics   Alcohol use: Not Currently    Comment: weekends   Drug use: Yes    Frequency: 1.0 times per week    Types: Marijuana    Comment: twice monthly     Allergies   Patient has no known allergies.   Review of Systems Review of Systems  Genitourinary:  Positive for hematuria.     Physical Exam Triage Vital Signs ED Triage Vitals  Encounter Vitals Group     BP 05/21/24 0820 (!) 142/94     Girls Systolic BP Percentile --      Girls Diastolic BP Percentile --      Boys Systolic BP Percentile --      Boys Diastolic BP Percentile --      Pulse Rate 05/21/24 0820 73     Resp 05/21/24 0820 16     Temp 05/21/24 0820 98.1 F (36.7 C)     Temp Source 05/21/24 0820 Oral     SpO2 05/21/24 0820 97 %     Weight 05/21/24 0817  179 lb 14.3 oz (81.6 kg)     Height 05/21/24 0817 5' 7 (1.702 m)     Head Circumference --      Peak Flow --      Pain Score 05/21/24 0816 0     Pain Loc --      Pain Education --      Exclude from Growth Chart --    No data found.  Updated Vital Signs BP (!) 142/94 (BP Location: Left Arm)   Pulse 73   Temp 98.1 F (36.7 C) (Oral)   Resp 16   Ht 5' 7 (1.702 m)   Wt 179 lb 14.3 oz (81.6 kg)   SpO2 97%   BMI 28.18 kg/m   Visual Acuity Right Eye Distance:   Left Eye Distance:   Bilateral Distance:    Right Eye Near:   Left Eye Near:    Bilateral Near:     Physical Exam Vitals and nursing note reviewed.  Constitutional:      Appearance: Normal appearance.  HENT:     Head: Normocephalic and atraumatic.  Eyes:     Pupils: Pupils are equal, round, and reactive to light.  Cardiovascular:     Rate and Rhythm: Normal rate.  Pulmonary:     Effort: Pulmonary effort is normal.  Abdominal:     Tenderness: There is no right CVA tenderness or left CVA tenderness.  Skin:    General: Skin is warm and dry.  Neurological:     General: No focal deficit present.     Mental Status: He is alert and oriented to person, place, and time.  Psychiatric:        Mood and Affect: Mood normal.        Behavior: Behavior normal.      UC Treatments / Results  Labs (all labs ordered are listed, but only abnormal results are displayed) Labs Reviewed  POCT URINE DIPSTICK - Abnormal; Notable for the following components:      Result Value   Color, UA red (*)    Clarity,  UA turbid (*)    Glucose, UA =100 (*)    Bilirubin, UA large (*)    Ketones, POC UA moderate (40) (*)    Spec Grav, UA <=1.005 (*)    Blood, UA large (*)    pH, UA 8.5 (*)    POC PROTEIN,UA >=300 (*)    Urobilinogen, UA >=8.0 (*)    Leukocytes, UA Large (3+) (*)    All other components within normal limits    EKG   Radiology No results found.  Procedures Procedures (including critical care  time)  Medications Ordered in UC Medications - No data to display  Initial Impression / Assessment and Plan / UC Course  I have reviewed the triage vital signs and the nursing notes.  Pertinent labs & imaging results that were available during my care of the patient were reviewed by me and considered in my medical decision making (see chart for details).     Reviewed exam and sx with patient. UA grossly abnormal. Given symptoms, blood thinning medications, and painless hematuria, advised ER evaluation for further work up and treatment. Pt is in agreement with plan and will go POV to the ER.  Final Clinical Impressions(s) / UC Diagnoses   Final diagnoses:  Hematuria, unspecified type     Discharge Instructions      Please go to the ER for further evaluation of your painless blood /blood clots in your urine     ED Prescriptions   None    PDMP not reviewed this encounter.   Loreda Myla SAUNDERS, NP 05/21/24 [phone]

## 2024-05-21 NOTE — Discharge Instructions (Signed)
 Follow-up with urology, please call them on Monday morning for an appointment.  They may be able to see you in a week or 2.  You need to be rechecked secondary to the number of clots you are passing and the findings on your CT. If you are worsening and cannot pee, return emergency department immediately. Take the antibiotic as prescribed

## 2024-05-25 NOTE — Assessment & Plan Note (Deleted)
 Hx of recurrent GH w/ intravesical clot  - on ASA /Eliquis   - CT Stone - no hydro, ~20g gland Significant CV comorbidity   - Plan for CT Urogram, Cr ~1.2  - Followed by office cystoscopy + cytology

## 2024-05-25 NOTE — Progress Notes (Deleted)
 05/25/24 9:41 AM   Cameron [name] March 22, 1961 [id number]   HPI: 63 y.o. male here for initial evaluation of gross hematuria  ED visit (05/21/24) - gross hematuria with clots   - CT stone = moderate density intravesical material, likely clot burden. No nephrolithiasis, no hydro  - continued clots in ED, unclear PVR or bladder scan  - patient denied catheter, discharged with hydration/volitional voiding  Hx of prior GH in 2024 - attributed to UTI Hx of prior nephrolithiasis  Significant cardiovascular hx: CHF, BLE edema, CAD, A-fib, STEMI, PVD Also limb ischemia, gangrene, CKD   PMH: Past Medical History:  Diagnosis Date   CHF (congestive heart failure) (HCC)    Coronary artery disease    a. 12/17 PCI with DES to pLAD with resdiual disease (RX therapy)   Hyperlipidemia    Hypertension    PAD (peripheral artery disease)    STEMI (ST elevation myocardial infarction) (HCC) 2017    Surgical History: Past Surgical History:  Procedure Laterality Date   CARDIAC CATHETERIZATION N/A 06/17/2016   Procedure: Left Heart Cath and Coronary Angiography;  Surgeon: Dorn JINNY Lesches, MD;  Location: [facility] INVASIVE CV LAB;  Service: Cardiovascular;  Laterality: N/A;   CARDIAC CATHETERIZATION N/A 06/17/2016   Procedure: Coronary Stent Intervention;  Surgeon: Dorn JINNY Lesches, MD;  Location: [facility] INVASIVE CV LAB;  Service: Cardiovascular;  Laterality: N/A;   cardiac stents     CERVICAL SPINE SURGERY     LOWER EXTREMITY ANGIOGRAPHY Left 06/25/2020   Procedure: LOWER EXTREMITY ANGIOGRAPHY;  Surgeon: Marea Selinda RAMAN, MD;  Location: [facility] INVASIVE CV LAB;  Service: Cardiovascular;  Laterality: Left;   LOWER EXTREMITY ANGIOGRAPHY Left 03/11/2023   Procedure: Lower Extremity Angiography;  Surgeon: Marea Selinda RAMAN, MD;  Location: [facility] INVASIVE CV LAB;  Service: Cardiovascular;  Laterality: Left;    Family History: Family History  Problem Relation Age of Onset   CAD Paternal Grandfather    Breast cancer  Mother    Parkinson's disease Father    Alzheimer's disease Father    Hypertension Father    Diabetes Father     Social History:  reports that he has been smoking cigarettes. He has never used smokeless tobacco. He reports that he does not currently use alcohol. He reports current drug use. Frequency: 1.00 time per week. Drug: Marijuana.      Physical Exam: There were no vitals taken for this visit.   Constitutional:  Alert and oriented, No acute distress. Cardiovascular: No clubbing, cyanosis, or edema. Respiratory: Normal respiratory effort, no increased work of breathing. GI: Nondistended GU: *** Skin: No rashes, bruises or suspicious lesions. Neurologic: Grossly intact, no focal deficits, moving all 4 extremities. Psychiatric: Normal mood and affect.  Laboratory Data: Component Ref Range & Units 2 mo ago  PSA (Prostate Specific Antigen), Total <=2.99 ng/mL 0.24    Latest Reference Range & Units 07/23/22 04:23 07/24/22 06:34 07/25/22 05:13 07/26/22 07:47 03/10/23 09:16 03/10/23 16:51 03/11/23 07:16 03/12/23 06:01 05/21/24 10:43  Creatinine 0.61 - 1.24 mg/dL 8.84 8.83 8.79 8.89 8.71 (H) 1.27 (H) 1.11 1.11 1.38 (H)  (H): Data is abnormally high  Pertinent Imaging: I have personally viewed and interpreted the CT Stone (05/21/24) - no hydronephrosis, no renal stones. Bladder has dependent layering debris likely intravesical clot, cannot exclude underlying lesion/mass. Prostate ~20g by CT estimation. .    Assessment & Plan:    Gross hematuria Assessment & Plan: Hx of recurrent GH w/ intravesical clot  - on ASA /Eliquis   -  CT Stone - no hydro, ~20g gland Significant CV comorbidity   - Plan for CT Urogram, Cr ~1.2  - Followed by office cystoscopy + cytology          Penne Skye, MD 05/25/2024  [facility] [address], [address] [place], [place] [address] [phone]

## 2024-05-26 ENCOUNTER — Encounter: Payer: Self-pay | Admitting: Ophthalmology

## 2024-05-26 NOTE — Anesthesia Preprocedure Evaluation (Addendum)
 Anesthesia Evaluation  Patient identified by MRN, date of birth, ID band Patient awake    Reviewed: Allergy & Precautions, H&P , NPO status , Patient's Chart, lab work & pertinent test results  Airway Mallampati: III  TM Distance: >3 FB Neck ROM: Full    Dental no notable dental hx. (+) Caps Multiple capped teeth:   Pulmonary neg pulmonary ROS, Current Smoker   Pulmonary exam normal breath sounds clear to auscultation       Cardiovascular hypertension, + CAD, + Past MI, + Peripheral Vascular Disease and +CHF  negative cardio ROS Normal cardiovascular exam Rhythm:Regular Rate:Normal  1. Left ventricular ejection fraction, by estimation, is 40 to 45%. The  left ventricle has mildly decreased function. The left ventricle  demonstrates global hypokinesis. The left ventricular internal cavity size  was moderately to severely dilated. Left  ventricular diastolic parameters are consistent with Grade II diastolic  dysfunction (pseudonormalization).   2. Right ventricular systolic function is low normal. The right  ventricular size is mildly enlarged. Mildly increased right ventricular  wall thickness.   3. Left atrial size was moderately dilated.   4. Right atrial size was mild to moderately dilated.   5. The mitral valve is grossly normal. Trivial mitral valve  regurgitation.   6. The aortic valve is grossly normal. Aortic valve regurgitation is not  visualized.     Neuro/Psych  PSYCHIATRIC DISORDERS  Depression     Neuromuscular disease negative neurological ROS  negative psych ROS   GI/Hepatic negative GI ROS, Neg liver ROS,,,  Endo/Other  negative endocrine ROS    Renal/GU Renal diseasenegative Renal ROS  negative genitourinary   Musculoskeletal negative musculoskeletal ROS (+) Arthritis ,    Abdominal   Peds negative pediatric ROS (+)  Hematology negative hematology ROS (+) Blood dyscrasia, anemia   Anesthesia  Other Findings Recent possible kidney stone, on antibiotics, gross hematuria 05-21-24  CHF (congestive heart failure) (HCC)  Coronary artery disease 12/17 PCI with DES to pLAD with resdiual disease (RX  therapy) Hyperlipidemia Hypertension PAD (peripheral artery disease) (HCC) 2017: STEMI (ST elevation myocardial infarction)  Medical History  Hypertension Coronary artery disease Hyperlipidemia  STEMI (ST elevation myocardial infarction) (HCC) CHF (congestive heart failure) (HCC) PAD (peripheral artery disease) S/P drug eluting coronary stent placement History of percutaneous coronary intervention Grade II diastolic dysfunction  Global hypokinesis of left ventricle Left atrial dilation  Right atrial dilation     Reproductive/Obstetrics negative OB ROS                              Anesthesia Physical Anesthesia Plan  ASA: 4  Anesthesia Plan: MAC   Post-op Pain Management:    Induction: Intravenous  PONV Risk Score and Plan:   Airway Management Planned: Natural Airway and Nasal Cannula  Additional Equipment:   Intra-op Plan:   Post-operative Plan:   Informed Consent: I have reviewed the patients History and Physical, chart, labs and discussed the procedure including the risks, benefits and alternatives for the proposed anesthesia with the patient or authorized representative who has indicated his/her understanding and acceptance.     Dental Advisory Given  Plan Discussed with: Anesthesiologist, CRNA and Surgeon  Anesthesia Plan Comments: (Patient consented for risks of anesthesia including but not limited to:  - adverse reactions to medications - damage to eyes, teeth, lips or other oral mucosa - nerve damage due to positioning  - sore throat or hoarseness - Damage  to heart, brain, nerves, lungs, other parts of body or loss of life  Patient voiced understanding and assent.)         Anesthesia Quick Evaluation

## 2024-05-26 NOTE — Discharge Instructions (Signed)

## 2024-05-30 ENCOUNTER — Ambulatory Visit
Admission: RE | Admit: 2024-05-30 | Discharge: 2024-05-30 | Disposition: A | Discharge status: Home / Self Care | Attending: Ophthalmology | Admitting: Ophthalmology

## 2024-05-30 ENCOUNTER — Ambulatory Visit: Payer: Self-pay | Admitting: Anesthesiology

## 2024-05-30 ENCOUNTER — Other Ambulatory Visit: Payer: Self-pay

## 2024-05-30 ENCOUNTER — Encounter
Admission: RE | Disposition: A | Discharge status: Home / Self Care | Payer: Self-pay | Source: Home / Self Care | Attending: Ophthalmology

## 2024-05-30 ENCOUNTER — Encounter: Payer: Self-pay | Admitting: Ophthalmology

## 2024-05-30 DIAGNOSIS — H2511 Age-related nuclear cataract, right eye: Secondary | ICD-10-CM | POA: Diagnosis present

## 2024-05-30 DIAGNOSIS — I509 Heart failure, unspecified: Secondary | ICD-10-CM | POA: Diagnosis not present

## 2024-05-30 DIAGNOSIS — I252 Old myocardial infarction: Secondary | ICD-10-CM | POA: Insufficient documentation

## 2024-05-30 DIAGNOSIS — I110 Hypertensive heart disease with heart failure: Secondary | ICD-10-CM | POA: Diagnosis not present

## 2024-05-30 DIAGNOSIS — I2510 Atherosclerotic heart disease of native coronary artery without angina pectoris: Secondary | ICD-10-CM | POA: Diagnosis not present

## 2024-05-30 DIAGNOSIS — Z5941 Food insecurity: Secondary | ICD-10-CM | POA: Insufficient documentation

## 2024-05-30 DIAGNOSIS — H25041 Posterior subcapsular polar age-related cataract, right eye: Secondary | ICD-10-CM | POA: Diagnosis present

## 2024-05-30 DIAGNOSIS — I739 Peripheral vascular disease, unspecified: Secondary | ICD-10-CM | POA: Diagnosis not present

## 2024-05-30 DIAGNOSIS — F17210 Nicotine dependence, cigarettes, uncomplicated: Secondary | ICD-10-CM | POA: Insufficient documentation

## 2024-05-30 HISTORY — DX: Myoneural disorder, unspecified: G70.9

## 2024-05-30 HISTORY — DX: Other ill-defined heart diseases: I51.89

## 2024-05-30 HISTORY — DX: Personal history of urinary calculi: Z87.442

## 2024-05-30 HISTORY — DX: Gout, unspecified: M10.9

## 2024-05-30 HISTORY — DX: Depression, unspecified: F32.A

## 2024-05-30 HISTORY — DX: Unspecified osteoarthritis, unspecified site: M19.90

## 2024-05-30 HISTORY — DX: Cardiomegaly: I51.7

## 2024-05-30 HISTORY — PX: CATARACT EXTRACTION W/PHACO: SHX586

## 2024-05-30 HISTORY — DX: Other psychoactive substance abuse, uncomplicated: F19.10

## 2024-05-30 SURGERY — PHACOEMULSIFICATION, CATARACT, WITH IOL INSERTION
Anesthesia: Monitor Anesthesia Care | Site: Eye | Laterality: Right

## 2024-05-30 MED ORDER — SIGHTPATH DOSE#1 BSS IO SOLN
INTRAOCULAR | Status: DC | PRN
Start: 2024-05-30 — End: 2024-05-30
  Administered 2024-05-30: 15 mL via INTRAOCULAR

## 2024-05-30 MED ORDER — LACTATED RINGERS IV SOLN: INTRAVENOUS | Status: DC

## 2024-05-30 MED ORDER — LIDOCAINE HCL (PF) 2 % IJ SOLN
INTRAMUSCULAR | Status: DC | PRN
  Administered 2024-05-30: 4 mL via INTRAOCULAR

## 2024-05-30 MED ORDER — MOXIFLOXACIN HCL 0.5 % OP SOLN
OPHTHALMIC | Status: DC | PRN
  Administered 2024-05-30: .2 mL via OPHTHALMIC

## 2024-05-30 MED ORDER — TETRACAINE HCL 0.5 % OP SOLN
1.0000 [drp] | OPHTHALMIC | Status: DC | PRN
  Administered 2024-05-30 (×3): 1 [drp] via OPHTHALMIC

## 2024-05-30 MED ORDER — FENTANYL CITRATE (PF) 100 MCG/2ML IJ SOLN
INTRAMUSCULAR | Status: AC
  Filled 2024-05-30: qty 2

## 2024-05-30 MED ORDER — FENTANYL CITRATE (PF) 100 MCG/2ML IJ SOLN
INTRAMUSCULAR | Status: DC | PRN
  Administered 2024-05-30: 50 ug via INTRAVENOUS

## 2024-05-30 MED ORDER — CYCLOPENTOLATE HCL 2 % OP SOLN
OPHTHALMIC | Status: AC
Start: 2024-05-30 — End: 2024-05-30
  Filled 2024-05-30: qty 2

## 2024-05-30 MED ORDER — TETRACAINE HCL 0.5 % OP SOLN
OPHTHALMIC | Status: AC
  Filled 2024-05-30: qty 4

## 2024-05-30 MED ORDER — MIDAZOLAM HCL (PF) 2 MG/2ML IJ SOLN
INTRAMUSCULAR | Status: DC | PRN
  Administered 2024-05-30: 2 mg via INTRAVENOUS

## 2024-05-30 MED ORDER — PHENYLEPHRINE HCL 10 % OP SOLN
1.0000 [drp] | OPHTHALMIC | Status: AC
  Administered 2024-05-30 (×3): 1 [drp] via OPHTHALMIC

## 2024-05-30 MED ORDER — SIGHTPATH DOSE#1 NA HYALUR & NA CHOND-NA HYALUR IO KIT
PACK | INTRAOCULAR | Status: DC | PRN
Start: 2024-05-30 — End: 2024-05-30
  Administered 2024-05-30: 1 via OPHTHALMIC

## 2024-05-30 MED ORDER — CYCLOPENTOLATE HCL 2 % OP SOLN
1.0000 [drp] | OPHTHALMIC | Status: AC
  Administered 2024-05-30 (×3): 1 [drp] via OPHTHALMIC

## 2024-05-30 MED ORDER — MIDAZOLAM HCL 2 MG/2ML IJ SOLN
INTRAMUSCULAR | Status: AC
Start: 2024-05-30 — End: 2024-05-30
  Filled 2024-05-30: qty 2

## 2024-05-30 MED ORDER — PHENYLEPHRINE HCL 10 % OP SOLN
OPHTHALMIC | Status: AC
  Filled 2024-05-30: qty 5

## 2024-05-30 MED ORDER — SIGHTPATH DOSE#1 BSS IO SOLN
INTRAOCULAR | Status: DC | PRN
  Administered 2024-05-30: 63 mL via OPHTHALMIC

## 2024-05-30 SURGICAL SUPPLY — 13 items
CANNULA ANT/CHMB 27G (MISCELLANEOUS) IMPLANT
DISSECTOR HYDRO NUCLEUS 50X22 (MISCELLANEOUS) ×1 IMPLANT
FEE CATARACT SUITE SIGHTPATH (MISCELLANEOUS) ×1 IMPLANT
GLOVE PI ULTRA LF STRL 7.5 (GLOVE) ×1 IMPLANT
GLOVE SURG SYN 6.5 PF PI BL (GLOVE) ×1 IMPLANT
GLOVE SURG SYN 8.5 PF PI BL (GLOVE) ×1 IMPLANT
LENS IOL TECNIS EYHANCE 23.0 (Intraocular Lens) IMPLANT
NDL FILTER BLUNT 18X1 1/2 (NEEDLE) ×1 IMPLANT
NEEDLE FILTER BLUNT 18X1 1/2 (NEEDLE) ×1 IMPLANT
PACK VIT ANT 23G (MISCELLANEOUS) IMPLANT
RING MALYGIN (MISCELLANEOUS) IMPLANT
SUTURE EHLN 10-0 CS-B-6CS-B-6 (SUTURE) IMPLANT
SYR 3ML LL SCALE MARK (SYRINGE) ×1 IMPLANT

## 2024-05-30 NOTE — Op Note (Signed)
 OPERATIVE NOTE  Cameron [name] [id number] 05/30/2024   PREOPERATIVE DIAGNOSIS:  Nuclear sclerotic cataract right eye.  H25.11   POSTOPERATIVE DIAGNOSIS:    Nuclear sclerotic cataract right eye.     PROCEDURE:  Phacoemusification with posterior chamber intraocular lens placement of the right eye   LENS:   Implant Name Type Inv. Item Serial No. Manufacturer Lot No. LRB No. Used Action  LENS IOL TECNIS EYHANCE 23.0 - [id number] Intraocular Lens LENS IOL TECNIS EYHANCE 23.0 [id number] [employer]  Right 1 Implanted       Procedure(s): PHACOEMULSIFICATION, CATARACT, WITH IOL INSERTION 2.49 00:23.5 (Right)  SURGEON:  Adine Novak, MD, MPH  ANESTHESIOLOGIST: Anesthesiologist: Ola Donny BROCKS, MD CRNA: Jahoo, Sonia, CRNA   ANESTHESIA:  Topical with tetracaine  drops augmented with 1% preservative-free intracameral lidocaine .  ESTIMATED BLOOD LOSS: less than 1 mL.   COMPLICATIONS:  None.   DESCRIPTION OF PROCEDURE:  The patient was identified in the holding room and transported to the operating room and placed in the supine position under the operating microscope.  The right eye was identified as the operative eye and it was prepped and draped in the usual sterile ophthalmic fashion.   A 1.0 millimeter clear-corneal paracentesis was made at the 10:30 position. 0.5 ml of preservative-free 1% lidocaine  with epinephrine  was injected into the anterior chamber.  The anterior chamber was filled with viscoelastic.  A 2.4 millimeter keratome was used to make a near-clear corneal incision at the 8:00 position.  A curvilinear capsulorrhexis was made with a cystotome and capsulorrhexis forceps.  Balanced salt solution was used to hydrodissect and hydrodelineate the nucleus.   Phacoemulsification was then used in stop and chop fashion to remove the lens nucleus and epinucleus.  The remaining cortex was then removed using the irrigation and aspiration handpiece. Viscoelastic was then placed into the  capsular bag to distend it for lens placement.  A lens was then injected into the capsular bag.  The remaining viscoelastic was aspirated.   Wounds were hydrated with balanced salt solution.  The anterior chamber was inflated to a physiologic pressure with balanced salt solution.   Intracameral vigamox  0.1 mL undiluted was injected into the eye and a drop placed onto the ocular surface.  No wound leaks were noted.  The patient was taken to the recovery room in stable condition without complications of anesthesia or surgery  Adine Novak 05/30/2024, 12:22 PM

## 2024-05-30 NOTE — Transfer of Care (Signed)
 Immediate Anesthesia Transfer of Care Note  Patient: Cameron [name]  Procedure(s) Performed: PHACOEMULSIFICATION, CATARACT, WITH IOL INSERTION 2.49 00:23.5 (Right: Eye)  Patient Location: PACU  Anesthesia Type: MAC  Level of Consciousness: awake, alert  and patient cooperative  Airway and Oxygen Therapy: Patient Spontanous Breathing and Patient connected to supplemental oxygen  Post-op Assessment: Post-op Vital signs reviewed, Patient's Cardiovascular Status Stable, Respiratory Function Stable, Patent Airway and No signs of Nausea or vomiting  Post-op Vital Signs: Reviewed and stable  Complications: No notable events documented.

## 2024-05-30 NOTE — H&P (Signed)
 [facility]   Primary Care Physician:  Lauran Hails Primary Care Ophthalmologist: Dr. Adine Novak  Pre-Procedure History & Physical: HPI:  Cameron [name] is a 63 y.o. male here for cataract surgery.   Past Medical History:  Diagnosis Date   Arthritis    CHF (congestive heart failure) (HCC)    Coronary artery disease    a. 12/17 PCI with DES to pLAD with resdiual disease (RX therapy)   Depression    Global hypokinesis of left ventricle    Gout    Grade II diastolic dysfunction    History of kidney stones    History of percutaneous coronary intervention 2017   Hyperlipidemia    Hypertension    Left atrial dilation    Neuromuscular disorder (HCC)    leg numb/ can walk by self   PAD (peripheral artery disease)    2 stents in left leg   Right atrial dilation    S/P drug eluting coronary stent placement 2017   STEMI (ST elevation myocardial infarction) (HCC) 2017   Substance abuse (HCC)    alcohol and marijuana    Past Surgical History:  Procedure Laterality Date   CARDIAC CATHETERIZATION N/A 06/17/2016   Procedure: Left Heart Cath and Coronary Angiography;  Surgeon: Dorn JINNY Lesches, MD;  [facility]: [facility];  Service: Cardiovascular;  Laterality: N/A;   CARDIAC CATHETERIZATION N/A 06/17/2016   Procedure: Coronary Stent Intervention;  Surgeon: Dorn JINNY Lesches, MD;  Location: [facility];  Service: Cardiovascular;  Laterality: N/A;   cardiac stents     CERVICAL SPINE SURGERY     LOWER EXTREMITY ANGIOGRAPHY Left 06/25/2020   Procedure: LOWER EXTREMITY ANGIOGRAPHY;  Surgeon: Marea Selinda RAMAN, MD;  Location: [facility] CV [facility];  Service: Cardiovascular;  Laterality: Left;   LOWER EXTREMITY ANGIOGRAPHY Left 03/11/2023   Procedure: Lower Extremity Angiography;  Surgeon: Marea Selinda RAMAN, MD;  Location: [facility] CV [facility];  Service: Cardiovascular;  Laterality: Left;    Prior to Admission medications   Medication Sig Start Date End Date Taking? Authorizing  Provider  acetaminophen  (TYLENOL ) 325 MG tablet Take 2 tablets (650 mg total) by mouth every 6 (six) hours as needed for moderate pain, mild pain, fever or headache. 03/12/23  Yes Alexander, Natalie, DO  allopurinol  (ZYLOPRIM ) 300 MG tablet Take 300 mg by mouth daily.   Yes [provider]  aspirin  EC 81 MG tablet Take 1 tablet (81 mg total) by mouth daily. Swallow whole. 03/13/23  Yes Alexander, Natalie, DO  atorvastatin  (LIPITOR ) 80 MG tablet Take 1 tablet (80 mg total) by mouth daily. Patient taking differently: Take 40 mg by mouth daily. 02/21/20  Yes Trudy Anthony HERO, MD  ciprofloxacin  (CIPRO ) 500 MG tablet Take 1 tablet (500 mg total) by mouth 2 (two) times daily for 10 days. 05/21/24 05/31/24 Yes Fisher, Devere ORN, PA-C  colchicine  0.6 MG tablet Take 1 tablet (0.6 mg total) by mouth daily. 07/27/22  Yes Wieting, Richard, MD  ELIQUIS  5 MG TABS tablet Take 1 tablet by mouth 2 (two) times daily. 12/22/22  Yes [provider]  FARXIGA  10 MG TABS tablet Take 10 mg by mouth daily.   Yes [provider]  furosemide  (LASIX ) 40 MG tablet Take 1 tablet (40 mg total) by mouth 2 (two) times daily. 07/26/22  Yes Wieting, Richard, MD  gabapentin (NEURONTIN) 300 MG capsule Take 300 mg by mouth 3 (three) times daily.   Yes [provider]  ibuprofen (ADVIL) 200  MG tablet Take 200 mg by mouth every 6 (six) hours as needed for headache or mild pain.   Yes [provider]  metoprolol  succinate (TOPROL -XL) 25 MG 24 hr tablet Take 0.5 tablets (12.5 mg total) by mouth at bedtime. 07/26/22  Yes Wieting, Richard, MD  pantoprazole  (PROTONIX ) 40 MG tablet Take 1 tablet (40 mg total) by mouth daily. 07/26/22 05/30/24 Yes Josette Ade, MD  Polyethylene Glycol 4500 POWD 1 application  by Does not apply route as needed.   Yes [provider]  spironolactone  (ALDACTONE ) 25 MG tablet Take 0.5 tablets (12.5 mg total) by mouth daily. 03/13/23  Yes Alexander, Natalie, DO   hydrocortisone  (ANUSOL -HC) 25 MG suppository One suppository twice a day as needed for hemorrhoids.  (Generic okay to substitute) 07/26/22   Josette Ade, MD  nicotine  (NICODERM CQ  - DOSED IN MG/24 HOURS) 14 mg/24hr patch One 14 mg patch chest wall daily as needed for nicotine  craving.  (Can substitute generic) 03/12/23   Alexander, Natalie, DO  nitroGLYCERIN  (NITROSTAT ) 0.4 MG SL tablet Place under the tongue. 06/20/16   [provider]  isosorbide  mononitrate (IMDUR ) 60 MG 24 hr tablet Take 1 tablet (60 mg total) by mouth daily. 02/04/18 05/30/19  Laurence Bridegroom, MD    Allergies as of 05/10/2024   (No Known Allergies)    Family History  Problem Relation Age of Onset   CAD Paternal Grandfather    Breast cancer Mother    Parkinson's disease Father    Alzheimer's disease Father    Hypertension Father    Diabetes Father     Social History   Socioeconomic History   Marital status: Divorced    Spouse name: Not on file   Number of children: Not on file   Years of education: Not on file   Highest education level: Not on file  Occupational History   Not on file  Tobacco Use   Smoking status: Every Day    Current packs/day: 1.00    Types: Cigarettes   Smokeless tobacco: Never  Vaping Use   Vaping status: Never Used  Substance and Sexual Activity   Alcohol use: Not Currently   Drug use: Yes    Frequency: 1.0 times per week    Types: Marijuana    Comment: twice monthly   Sexual activity: Not Currently  Other Topics Concern   Not on file  Social History Narrative   Not on file   Social Drivers of Health   Financial Resource Strain: Low Risk  (03/24/2024)   Received from [facility]   Overall Financial Resource Strain (CARDIA)    Difficulty of Paying Living Expenses: Not hard at all  Food Insecurity: Food Insecurity Present (03/24/2024)   Received from [facility]   Hunger Vital Sign    Within the past 12 months, you worried that  your food would run out before you got the money to buy more.: Never true    Within the past 12 months, the food you bought just didn't last and you didn't have money to get more.: Sometimes true  Transportation Needs: No Transportation Needs (03/24/2024)   Received from [facility] - Transportation    In the past 12 months, has lack of transportation kept you from medical appointments or from getting medications?: No    Lack of Transportation (Non-Medical): No  Physical Activity: Not on file  Stress: Not on file  Social Connections: Not on file  Intimate Partner Violence: Not At Risk (03/10/2023)   Humiliation, Afraid, Rape, and Kick questionnaire    Fear of Current or Ex-Partner: No    Emotionally Abused: No    Physically Abused: No    Sexually Abused: No    Review of Systems: See HPI, otherwise negative ROS  Physical Exam: BP 126/70   Pulse 68   Temp 98.9 F (37.2 C) (Temporal)   Wt 78.5 kg   SpO2 100%   BMI 27.10 kg/m  General:   Alert, cooperative. Head:  Normocephalic and atraumatic. Respiratory:  Normal work of breathing. Cardiovascular:  NAD  Impression/Plan: Cameron [name] is here for cataract surgery.  Risks, benefits, limitations, and alternatives regarding cataract surgery have been reviewed with the patient.  Questions have been answered.  All parties agreeable.   Adine Novak, MD  05/30/2024, 11:58 AM

## 2024-05-30 NOTE — Anesthesia Postprocedure Evaluation (Signed)
 Anesthesia Post Note  Patient: Cameron [name]  Procedure(s) Performed: PHACOEMULSIFICATION, CATARACT, WITH IOL INSERTION 2.49 00:23.5 (Right: Eye)  Patient location during evaluation: PACU Anesthesia Type: MAC Level of consciousness: awake and alert Pain management: pain level controlled Vital Signs Assessment: post-procedure vital signs reviewed and stable Respiratory status: spontaneous breathing, nonlabored ventilation, respiratory function stable and patient connected to nasal cannula oxygen Cardiovascular status: stable and blood pressure returned to baseline Postop Assessment: no apparent nausea or vomiting Anesthetic complications: no   No notable events documented.   Last Vitals:  Vitals:   05/30/24 1224 05/30/24 1228  BP:  98/70  Pulse: 75 65  Resp: 15 14  Temp:    SpO2: 99% 98%    Last Pain:  Vitals:   05/30/24 1228  TempSrc:   PainSc: 0-No pain                 Anil Havard C Nazyia Gaugh

## 2024-06-01 ENCOUNTER — Ambulatory Visit: Admitting: Urology

## 2024-06-01 DIAGNOSIS — R310 Gross hematuria: Secondary | ICD-10-CM

## 2024-06-06 NOTE — Progress Notes (Deleted)
 06/06/24 3:16 PM   Cameron [name] 1961-05-25 [id number]   HPI: 63 y.o. male here for initial evaluation of gross hematuria  ED visit (05/21/24) - gross hematuria with clots   - CT stone = moderate density intravesical material, likely clot burden. No nephrolithiasis, no hydro  - continued clots in ED, unclear PVR or bladder scan  - patient denied catheter, discharged with hydration/volitional voiding  Hx of prior GH in 2024 - attributed to UTI Hx of prior nephrolithiasis  Significant cardiovascular hx: CHF, BLE edema, CAD, A-fib, STEMI, PVD Also limb ischemia, gangrene, CKD   PMH: Past Medical History:  Diagnosis Date   Arthritis    CHF (congestive heart failure) (HCC)    Coronary artery disease    a. 12/17 PCI with DES to pLAD with resdiual disease (RX therapy)   Depression    Global hypokinesis of left ventricle    Gout    Grade II diastolic dysfunction    History of kidney stones    History of percutaneous coronary intervention 2017   Hyperlipidemia    Hypertension    Left atrial dilation    Neuromuscular disorder (HCC)    leg numb/ can walk by self   PAD (peripheral artery disease)    2 stents in left leg   Right atrial dilation    S/P drug eluting coronary stent placement 2017   STEMI (ST elevation myocardial infarction) (HCC) 2017   Substance abuse (HCC)    alcohol and marijuana    Surgical History: Past Surgical History:  Procedure Laterality Date   CARDIAC CATHETERIZATION N/A 06/17/2016   Procedure: Left Heart Cath and Coronary Angiography;  Surgeon: Dorn JINNY Lesches, MD;  [facility]: [facility];  Service: Cardiovascular;  Laterality: N/A;   CARDIAC CATHETERIZATION N/A 06/17/2016   Procedure: Coronary Stent Intervention;  Surgeon: Dorn JINNY Lesches, MD;  Location: [facility];  Service: Cardiovascular;  Laterality: N/A;   cardiac stents     CATARACT EXTRACTION W/PHACO Right 05/30/2024   Procedure: PHACOEMULSIFICATION, CATARACT, WITH  IOL INSERTION 2.49 00:23.5;  Surgeon: Myrna Adine Anes, MD;  Location[facility];  Service: Ophthalmology;  Laterality: Right;   CERVICAL SPINE SURGERY     LOWER EXTREMITY ANGIOGRAPHY Left 06/25/2020   Procedure: LOWER EXTREMITY ANGIOGRAPHY;  Surgeon: Marea Selinda RAMAN, MD;  Location: [facility];  Service: Cardiovascular;  Laterality: Left;   LOWER EXTREMITY ANGIOGRAPHY Left 03/11/2023   Procedure: Lower Extremity Angiography;  Surgeon: Marea Selinda RAMAN, MD;  Location: [facility];  Service: Cardiovascular;  Laterality: Left;    Family History: Family History  Problem Relation Age of Onset   CAD Paternal Grandfather    Breast cancer Mother    Parkinson's disease Father    Alzheimer's disease Father    Hypertension Father    Diabetes Father     Social History:  reports that he has been smoking cigarettes. He has never used smokeless tobacco. He reports that he does not currently use alcohol. He reports current drug use. Frequency: 1.00 time per week. Drug: Marijuana.      Physical Exam: There were no vitals taken for this visit.   Constitutional:  Alert and oriented, No acute distress. Cardiovascular: No clubbing, cyanosis, or edema. Respiratory: Normal respiratory effort, no increased work of breathing. GI: Nondistended GU: *** Skin: No rashes, bruises or suspicious lesions. Neurologic: Grossly intact, no focal deficits, moving all 4 extremities. Psychiatric: Normal mood and affect.  Laboratory Data: Component Ref  Range & Units 2 mo ago  PSA (Prostate Specific Antigen), Total <=2.99 ng/mL 0.24    Latest Reference Range & Units 07/23/22 04:23 07/24/22 06:34 07/25/22 05:13 07/26/22 07:47 03/10/23 09:16 03/10/23 16:51 03/11/23 07:16 03/12/23 06:01 05/21/24 10:43  Creatinine 0.61 - 1.24 mg/dL 8.84 8.83 8.79 8.89 8.71 (H) 1.27 (H) 1.11 1.11 1.38 (H)  (H): Data is abnormally high  Pertinent Imaging: I have personally viewed and interpreted the CT Stone  (05/21/24) - no hydronephrosis, no renal stones. Bladder has dependent layering debris likely intravesical clot, cannot exclude underlying lesion/mass. Prostate ~20g by CT estimation. .    Assessment & Plan:    Gross hematuria Assessment & Plan: Hx of recurrent GH w/ intravesical clot  - on ASA /Eliquis   - CT Stone - no hydro, ~20g gland Significant CV comorbidity   - Plan for CT Urogram, Cr ~1.2  - Followed by office cystoscopy + cytology           Penne Skye, MD 06/06/2024  [facility] [address], [address] [place], [place] [address] [phone]

## 2024-06-06 NOTE — Assessment & Plan Note (Deleted)
 Hx of recurrent GH w/ intravesical clot  - on ASA /Eliquis   - CT Stone - no hydro, ~20g gland Significant CV comorbidity   - Plan for CT Urogram, Cr ~1.2  - Followed by office cystoscopy + cytology

## 2024-06-07 NOTE — Discharge Instructions (Signed)

## 2024-06-10 ENCOUNTER — Ambulatory Visit: Admitting: Urology

## 2024-06-10 DIAGNOSIS — R310 Gross hematuria: Secondary | ICD-10-CM

## 2024-06-13 ENCOUNTER — Other Ambulatory Visit: Payer: Self-pay | Admitting: Orthopedic Surgery

## 2024-06-13 ENCOUNTER — Ambulatory Visit: Payer: Self-pay | Admitting: Anesthesiology

## 2024-06-13 ENCOUNTER — Other Ambulatory Visit: Payer: Self-pay

## 2024-06-13 ENCOUNTER — Encounter: Payer: Self-pay | Admitting: Ophthalmology

## 2024-06-13 ENCOUNTER — Ambulatory Visit
Admission: RE | Admit: 2024-06-13 | Discharge: 2024-06-13 | Disposition: A | Attending: Ophthalmology | Admitting: Ophthalmology

## 2024-06-13 ENCOUNTER — Encounter: Admission: RE | Disposition: A | Payer: Self-pay | Source: Home / Self Care | Attending: Ophthalmology

## 2024-06-13 DIAGNOSIS — S46002A Unspecified injury of muscle(s) and tendon(s) of the rotator cuff of left shoulder, initial encounter: Secondary | ICD-10-CM

## 2024-06-13 DIAGNOSIS — M25312 Other instability, left shoulder: Secondary | ICD-10-CM

## 2024-06-13 HISTORY — PX: CATARACT EXTRACTION W/PHACO: SHX586

## 2024-06-13 SURGERY — PHACOEMULSIFICATION, CATARACT, WITH IOL INSERTION
Anesthesia: Monitor Anesthesia Care | Site: Eye | Laterality: Left

## 2024-06-13 MED ORDER — LACTATED RINGERS IV SOLN: INTRAVENOUS | Status: DC

## 2024-06-13 MED ORDER — FENTANYL CITRATE (PF) 100 MCG/2ML IJ SOLN
INTRAMUSCULAR | Status: DC | PRN
  Administered 2024-06-13 (×2): 50 ug via INTRAVENOUS

## 2024-06-13 MED ORDER — SIGHTPATH DOSE#1 NA HYALUR & NA CHOND-NA HYALUR IO KIT
PACK | INTRAOCULAR | Status: DC | PRN
  Administered 2024-06-13: 1 via OPHTHALMIC

## 2024-06-13 MED ORDER — SIGHTPATH DOSE#1 BSS IO SOLN
INTRAOCULAR | Status: DC | PRN
  Administered 2024-06-13: 15 mL via INTRAOCULAR

## 2024-06-13 MED ORDER — LIDOCAINE HCL (PF) 2 % IJ SOLN
INTRAOCULAR | Status: DC | PRN
  Administered 2024-06-13: 4 mL via INTRAOCULAR

## 2024-06-13 MED ORDER — TETRACAINE HCL 0.5 % OP SOLN
1.0000 [drp] | OPHTHALMIC | Status: DC | PRN
  Administered 2024-06-13 (×3): 1 [drp] via OPHTHALMIC

## 2024-06-13 MED ORDER — PHENYLEPHRINE HCL 10 % OP SOLN
1.0000 [drp] | OPHTHALMIC | Status: AC
  Administered 2024-06-13 (×3): 1 [drp] via OPHTHALMIC

## 2024-06-13 MED ORDER — MIDAZOLAM HCL (PF) 2 MG/2ML IJ SOLN
INTRAMUSCULAR | Status: DC | PRN
  Administered 2024-06-13: 2 mg via INTRAVENOUS

## 2024-06-13 MED ORDER — MIDAZOLAM HCL 2 MG/2ML IJ SOLN
INTRAMUSCULAR | Status: AC
  Filled 2024-06-13: qty 2

## 2024-06-13 MED ORDER — CYCLOPENTOLATE HCL 2 % OP SOLN
1.0000 [drp] | OPHTHALMIC | Status: AC
  Administered 2024-06-13 (×3): 1 [drp] via OPHTHALMIC

## 2024-06-13 MED ORDER — TETRACAINE HCL 0.5 % OP SOLN
OPHTHALMIC | Status: AC
  Filled 2024-06-13: qty 4

## 2024-06-13 MED ORDER — MOXIFLOXACIN HCL 0.5 % OP SOLN
OPHTHALMIC | Status: DC | PRN
  Administered 2024-06-13: .2 mL via OPHTHALMIC

## 2024-06-13 MED ORDER — CYCLOPENTOLATE HCL 2 % OP SOLN
OPHTHALMIC | Status: AC
  Filled 2024-06-13: qty 2

## 2024-06-13 MED ORDER — SIGHTPATH DOSE#1 BSS IO SOLN
INTRAOCULAR | Status: DC | PRN
  Administered 2024-06-13: 83 mL via OPHTHALMIC

## 2024-06-13 MED ORDER — PHENYLEPHRINE HCL 10 % OP SOLN
OPHTHALMIC | Status: AC
  Filled 2024-06-13: qty 5

## 2024-06-13 MED ORDER — FENTANYL CITRATE (PF) 100 MCG/2ML IJ SOLN
INTRAMUSCULAR | Status: AC
  Filled 2024-06-13: qty 2

## 2024-06-13 SURGICAL SUPPLY — 9 items
DISSECTOR HYDRO NUCLEUS 50X22 (MISCELLANEOUS) ×1 IMPLANT
FEE CATARACT SUITE SIGHTPATH (MISCELLANEOUS) ×1 IMPLANT
GLOVE PI ULTRA LF STRL 7.5 (GLOVE) ×1 IMPLANT
GLOVE SURG SYN 6.5 PF PI BL (GLOVE) ×1 IMPLANT
GLOVE SURG SYN 8.5 PF PI BL (GLOVE) ×1 IMPLANT
LENS IOL TECNIS EYHANCE 22.5 (Intraocular Lens) IMPLANT
NDL FILTER BLUNT 18X1 1/2 (NEEDLE) ×1 IMPLANT
NEEDLE FILTER BLUNT 18X1 1/2 (NEEDLE) ×1 IMPLANT
SYR 3ML LL SCALE MARK (SYRINGE) ×1 IMPLANT

## 2024-06-13 NOTE — Transfer of Care (Signed)
 Immediate Anesthesia Transfer of Care Note  Patient: Cameron [name]  Procedure(s) Performed: PHACOEMULSIFICATION, CATARACT, WITH IOL INSERTION 2.52 00:27.8 (Left: Eye)  Patient Location: PACU  Anesthesia Type: MAC  Level of Consciousness: awake, alert  and patient cooperative  Airway and Oxygen Therapy: Patient Spontanous Breathing   Post-op Assessment: Post-op Vital signs reviewed, Patient's Cardiovascular Status Stable, Respiratory Function Stable, Patent Airway and No signs of Nausea or vomiting  Post-op Vital Signs: Reviewed and stable  Complications: No notable events documented.

## 2024-06-13 NOTE — Op Note (Signed)
 OPERATIVE NOTE  Cameron [name] [id number] 06/13/2024   PREOPERATIVE DIAGNOSIS:  Nuclear sclerotic cataract left eye.  H25.12   POSTOPERATIVE DIAGNOSIS:    Nuclear sclerotic cataract left eye.     PROCEDURE:  Phacoemusification with posterior chamber intraocular lens placement of the left eye   LENS:   Implant Name Type Inv. Item Serial No. Manufacturer Lot No. LRB No. Used Action  LENS IOL TECNIS EYHANCE 22.5 - [id number] Intraocular Lens LENS IOL TECNIS EYHANCE 22.5 [id number] [employer]  Left 1 Implanted      Procedure(s): PHACOEMULSIFICATION, CATARACT, WITH IOL INSERTION 2.52 00:27.8 (Left)  SURGEON:  Adine Novak, MD, MPH   ANESTHESIA:  Topical with tetracaine  drops augmented with 1% preservative-free intracameral lidocaine .  ESTIMATED BLOOD LOSS: <1 mL   COMPLICATIONS:  None.   DESCRIPTION OF PROCEDURE:  The patient was identified in the holding room and transported to the operating room and placed in the supine position under the operating microscope.  The left eye was identified as the operative eye and it was prepped and draped in the usual sterile ophthalmic fashion.   A 1.0 millimeter clear-corneal paracentesis was made at the 5:00 position. 0.5 ml of preservative-free 1% lidocaine  with epinephrine  was injected into the anterior chamber.  The anterior chamber was filled with viscoelastic.  A 2.4 millimeter keratome was used to make a near-clear corneal incision at the 2:00 position.  A curvilinear capsulorrhexis was made with a cystotome and capsulorrhexis forceps.  Balanced salt solution was used to hydrodissect and hydrodelineate the nucleus.   Phacoemulsification was then used in stop and chop fashion to remove the lens nucleus and epinucleus.  The remaining cortex was then removed using the irrigation and aspiration handpiece. Viscoelastic was then placed into the capsular bag to distend it for lens placement.  A lens was then injected into the capsular bag.  The  remaining viscoelastic was aspirated.   Wounds were hydrated with balanced salt solution.  The anterior chamber was inflated to a physiologic pressure with balanced salt solution.  Intracameral vigamox  0.1 mL undiltued was injected into the eye and a drop placed onto the ocular surface.  No wound leaks were noted.  The patient was taken to the recovery room in stable condition without complications of anesthesia or surgery  Adine Novak 06/13/2024, 11:17 AM

## 2024-06-13 NOTE — Anesthesia Preprocedure Evaluation (Signed)
 Anesthesia Evaluation  Patient identified by MRN, date of birth, ID band Patient awake    Reviewed: Allergy & Precautions, H&P , NPO status , Patient's Chart, lab work & pertinent test results  Airway Mallampati: III  TM Distance: >3 FB Neck ROM: Full    Dental no notable dental hx. (+) Caps Multiple capped teeth:   Pulmonary neg pulmonary ROS, Current Smoker   Pulmonary exam normal breath sounds clear to auscultation       Cardiovascular hypertension, + CAD, + Past MI, + Peripheral Vascular Disease and +CHF  negative cardio ROS Normal cardiovascular exam Rhythm:Regular Rate:Normal  1. Left ventricular ejection fraction, by estimation, is 40 to 45%. The  left ventricle has mildly decreased function. The left ventricle  demonstrates global hypokinesis. The left ventricular internal cavity size  was moderately to severely dilated. Left  ventricular diastolic parameters are consistent with Grade II diastolic  dysfunction (pseudonormalization).   2. Right ventricular systolic function is low normal. The right  ventricular size is mildly enlarged. Mildly increased right ventricular  wall thickness.   3. Left atrial size was moderately dilated.   4. Right atrial size was mild to moderately dilated.   5. The mitral valve is grossly normal. Trivial mitral valve  regurgitation.   6. The aortic valve is grossly normal. Aortic valve regurgitation is not  visualized.     Neuro/Psych  PSYCHIATRIC DISORDERS  Depression     Neuromuscular disease negative neurological ROS  negative psych ROS   GI/Hepatic negative GI ROS, Neg liver ROS,,,  Endo/Other  negative endocrine ROS    Renal/GU Renal diseasenegative Renal ROS  negative genitourinary   Musculoskeletal negative musculoskeletal ROS (+) Arthritis ,    Abdominal   Peds negative pediatric ROS (+)  Hematology negative hematology ROS (+) Blood dyscrasia, anemia   Anesthesia  Other Findings Recent possible kidney stone, on antibiotics, gross hematuria 05-21-24  CHF (congestive heart failure) (HCC)  Coronary artery disease 12/17 PCI with DES to pLAD with resdiual disease (RX  therapy) Hyperlipidemia Hypertension PAD (peripheral artery disease) (HCC) 2017: STEMI (ST elevation myocardial infarction)  Medical History  Hypertension Coronary artery disease Hyperlipidemia  STEMI (ST elevation myocardial infarction) (HCC) CHF (congestive heart failure) (HCC) PAD (peripheral artery disease) S/P drug eluting coronary stent placement History of percutaneous coronary intervention Grade II diastolic dysfunction  Global hypokinesis of left ventricle Left atrial dilation  Right atrial dilation     Reproductive/Obstetrics negative OB ROS                              Anesthesia Physical Anesthesia Plan  ASA: 4  Anesthesia Plan: MAC   Post-op Pain Management:    Induction: Intravenous  PONV Risk Score and Plan:   Airway Management Planned: Natural Airway and Nasal Cannula  Additional Equipment:   Intra-op Plan:   Post-operative Plan:   Informed Consent: I have reviewed the patients History and Physical, chart, labs and discussed the procedure including the risks, benefits and alternatives for the proposed anesthesia with the patient or authorized representative who has indicated his/her understanding and acceptance.     Dental Advisory Given  Plan Discussed with: Anesthesiologist, CRNA and Surgeon  Anesthesia Plan Comments: (Patient consented for risks of anesthesia including but not limited to:  - adverse reactions to medications - damage to eyes, teeth, lips or other oral mucosa - nerve damage due to positioning  - sore throat or hoarseness - Damage  to heart, brain, nerves, lungs, other parts of body or loss of life  Patient voiced understanding and assent.)         Anesthesia Quick Evaluation

## 2024-06-13 NOTE — H&P (Signed)
 [facility]   Primary Care Physician:  Lauran Hails Primary Care Ophthalmologist: Dr. Adine Novak  Pre-Procedure History & Physical: HPI:  Cameron [name] is a 64 y.o. male here for cataract surgery.   Past Medical History:  Diagnosis Date   Arthritis    CHF (congestive heart failure) (HCC)    Coronary artery disease    a. 12/17 PCI with DES to pLAD with resdiual disease (RX therapy)   Depression    Global hypokinesis of left ventricle    Gout    Grade II diastolic dysfunction    History of kidney stones    History of percutaneous coronary intervention 2017   Hyperlipidemia    Hypertension    Left atrial dilation    Neuromuscular disorder (HCC)    leg numb/ can walk by self   PAD (peripheral artery disease)    2 stents in left leg   Right atrial dilation    S/P drug eluting coronary stent placement 2017   STEMI (ST elevation myocardial infarction) (HCC) 2017   Substance abuse (HCC)    alcohol and marijuana    Past Surgical History:  Procedure Laterality Date   CARDIAC CATHETERIZATION N/A 06/17/2016   Procedure: Left Heart Cath and Coronary Angiography;  Surgeon: Dorn JINNY Lesches, MD;  Location: [facility];  Service: Cardiovascular;  Laterality: N/A;   CARDIAC CATHETERIZATION N/A 06/17/2016   Procedure: Coronary Stent Intervention;  Surgeon: Dorn JINNY Lesches, MD;  Location: [facility];  Service: Cardiovascular;  Laterality: N/A;   cardiac stents     CATARACT EXTRACTION W/PHACO Right 05/30/2024   Procedure: PHACOEMULSIFICATION, CATARACT, WITH IOL INSERTION 2.49 00:23.5;  Surgeon: Novak Adine Anes, MD;  Location: [facility];  Service: Ophthalmology;  Laterality: Right;   CERVICAL SPINE SURGERY     LOWER EXTREMITY ANGIOGRAPHY Left 06/25/2020   Procedure: LOWER EXTREMITY ANGIOGRAPHY;  Surgeon: Marea Selinda RAMAN, MD;  Location: [facility];  Service: Cardiovascular;  Laterality: Left;   LOWER EXTREMITY ANGIOGRAPHY Left 03/11/2023    Procedure: Lower Extremity Angiography;  Surgeon: Marea Selinda RAMAN, MD;  Location: [facility];  Service: Cardiovascular;  Laterality: Left;    Prior to Admission medications   Medication Sig Start Date End Date Taking? Authorizing Provider  acetaminophen  (TYLENOL ) 325 MG tablet Take 2 tablets (650 mg total) by mouth every 6 (six) hours as needed for moderate pain, mild pain, fever or headache. 03/12/23  Yes Alexander, Natalie, DO  allopurinol  (ZYLOPRIM ) 300 MG tablet Take 300 mg by mouth daily.   Yes [provider]  aspirin  EC 81 MG tablet Take 1 tablet (81 mg total) by mouth daily. Swallow whole. 03/13/23  Yes Alexander, Natalie, DO  atorvastatin  (LIPITOR ) 80 MG tablet Take 1 tablet (80 mg total) by mouth daily. Patient taking differently: Take 40 mg by mouth daily. 02/21/20  Yes Trudy Anthony HERO, MD  colchicine  0.6 MG tablet Take 1 tablet (0.6 mg total) by mouth daily. 07/27/22  Yes Wieting, Richard, MD  ELIQUIS  5 MG TABS tablet Take 1 tablet by mouth 2 (two) times daily. 12/22/22  Yes [provider]  FARXIGA  10 MG TABS tablet Take 10 mg by mouth daily.   Yes [provider]  gabapentin (NEURONTIN) 300 MG capsule Take 300 mg by mouth 3 (three) times daily.   Yes [provider]  hydrocortisone  (ANUSOL -HC) 25 MG suppository One suppository twice a day as needed for hemorrhoids.  (Generic okay to substitute) 07/26/22  Yes Josette Ade, MD  ibuprofen (ADVIL) 200 MG tablet Take 200 mg by mouth every 6 (six) hours as needed for headache or mild pain.   Yes [provider]  metoprolol  succinate (TOPROL -XL) 25 MG 24 hr tablet Take 0.5 tablets (12.5 mg total) by mouth at bedtime. 07/26/22  Yes Wieting, Richard, MD  nicotine  (NICODERM CQ  - DOSED IN MG/24 HOURS) 14 mg/24hr patch One 14 mg patch chest wall daily as needed for nicotine  craving.  (Can substitute generic) 03/12/23  Yes Alexander, Natalie, DO  Polyethylene Glycol 4500 POWD 1 application  by Does  not apply route as needed.   Yes [provider]  spironolactone  (ALDACTONE ) 25 MG tablet Take 0.5 tablets (12.5 mg total) by mouth daily. 03/13/23  Yes Alexander, Natalie, DO  furosemide  (LASIX ) 40 MG tablet Take 1 tablet (40 mg total) by mouth 2 (two) times daily. 07/26/22   Josette Ade, MD  nitroGLYCERIN  (NITROSTAT ) 0.4 MG SL tablet Place under the tongue. 06/20/16   [provider]  pantoprazole  (PROTONIX ) 40 MG tablet Take 1 tablet (40 mg total) by mouth daily. 07/26/22 05/30/24  Josette Ade, MD  isosorbide  mononitrate (IMDUR ) 60 MG 24 hr tablet Take 1 tablet (60 mg total) by mouth daily. 02/04/18 05/30/19  Laurence Bridegroom, MD    Allergies as of 05/10/2024   (No Known Allergies)    Family History  Problem Relation Age of Onset   CAD Paternal Grandfather    Breast cancer Mother    Parkinson's disease Father    Alzheimer's disease Father    Hypertension Father    Diabetes Father     Social History   Socioeconomic History   Marital status: Divorced    Spouse name: Not on file   Number of children: Not on file   Years of education: Not on file   Highest education level: Not on file  Occupational History   Not on file  Tobacco Use   Smoking status: Every Day    Current packs/day: 1.00    Types: Cigarettes   Smokeless tobacco: Never  Vaping Use   Vaping status: Never Used  Substance and Sexual Activity   Alcohol use: Not Currently   Drug use: Yes    Frequency: 1.0 times per week    Types: Marijuana    Comment: twice monthly   Sexual activity: Not Currently  Other Topics Concern   Not on file  Social History Narrative   Not on file   Social Drivers of Health   Financial Resource Strain: Low Risk  (03/24/2024)   Received from [facility]   Overall Financial Resource Strain (CARDIA)    Difficulty of Paying Living Expenses: Not hard at all  Food Insecurity: Food Insecurity Present (03/24/2024)   [facility] from [facility]   Hunger Vital Sign    Within the past 12 months, you worried that your food would run out before you got the money to buy more.: Never true    Within the past 12 months, the food you bought just didn't last and you didn't have money to get more.: Sometimes true  Transportation Needs: No Transportation Needs (03/24/2024)   Received from [facility] - Transportation    In the past 12 months, has lack of transportation kept you from medical appointments or from getting medications?: No    Lack of Transportation (Non-Medical): No  Physical Activity: Not on file  Stress: Not on file  Social Connections: Not on file  Intimate Partner Violence: Not At Risk (03/10/2023)   Humiliation, Afraid, Rape, and Kick questionnaire    Fear of Current or Ex-Partner: No    Emotionally Abused: No    Physically Abused: No    Sexually Abused: No    Review of Systems: See HPI, otherwise negative ROS  Physical Exam: BP 122/73   Pulse 84   Temp (!) 97.4 F (36.3 C) (Temporal)   Resp (!) 22   Ht 5' 7 (1.702 m)   Wt 77.4 kg   SpO2 96%   BMI 26.74 kg/m  General:   Alert, cooperative. Head:  Normocephalic and atraumatic. Respiratory:  Normal work of breathing. Cardiovascular:  NAD  Impression/Plan: Cameron [name] is here for cataract surgery.  Risks, benefits, limitations, and alternatives regarding cataract surgery have been reviewed with the patient.  Questions have been answered.  All parties agreeable.   Adine Novak, MD  06/13/2024, 10:45 AM

## 2024-06-13 NOTE — Anesthesia Postprocedure Evaluation (Signed)
 Anesthesia Post Note  Patient: Cameron [name]  Procedure(s) Performed: PHACOEMULSIFICATION, CATARACT, WITH IOL INSERTION 2.52 00:27.8 (Left: Eye)  Patient location during evaluation: PACU Anesthesia Type: MAC Level of consciousness: awake and alert Pain management: pain level controlled Vital Signs Assessment: post-procedure vital signs reviewed and stable Respiratory status: spontaneous breathing, nonlabored ventilation, respiratory function stable and patient connected to nasal cannula oxygen Cardiovascular status: stable and blood pressure returned to baseline Postop Assessment: no apparent nausea or vomiting Anesthetic complications: no   No notable events documented.   Last Vitals:  Vitals:   06/13/24 1117 06/13/24 1121  BP: 112/74 110/75  Pulse: 93 76  Resp: 14 18  Temp: (!) 36.1 C (!) 36.1 C  SpO2: 96% 96%    Last Pain:  Vitals:   06/13/24 1121  TempSrc:   PainSc: 0-No pain                 Prentice Murphy

## 2024-06-14 NOTE — Progress Notes (Unsigned)
 06/14/24 4:29 PM   Cameron [name] 29-Aug-1960 [id number]   HPI: 63 y.o. male here for initial evaluation of gross hematuria  ED visit (05/21/24) - gross hematuria with clots   - CT stone = moderate density intravesical material, likely clot burden. No nephrolithiasis, no hydro  - continued clots in ED, unclear PVR or bladder scan  - patient denied catheter, discharged with hydration/volitional voiding  Hx of prior GH in 2024 - attributed to UTI Hx of prior nephrolithiasis  Significant cardiovascular hx: CHF, BLE edema, CAD, A-fib, STEMI, PVD Also limb ischemia, gangrene, CKD   PMH: Past Medical History:  Diagnosis Date   Arthritis    CHF (congestive heart failure) (HCC)    Coronary artery disease    a. 12/17 PCI with DES to pLAD with resdiual disease (RX therapy)   Depression    Global hypokinesis of left ventricle    Gout    Grade II diastolic dysfunction    History of kidney stones    History of percutaneous coronary intervention 2017   Hyperlipidemia    Hypertension    Left atrial dilation    Neuromuscular disorder (HCC)    leg numb/ can walk by self   PAD (peripheral artery disease)    2 stents in left leg   Right atrial dilation    S/P drug eluting coronary stent placement 2017   STEMI (ST elevation myocardial infarction) (HCC) 2017   Substance abuse (HCC)    alcohol and marijuana    Surgical History: Past Surgical History:  Procedure Laterality Date   CARDIAC CATHETERIZATION N/A 06/17/2016   Procedure: Left Heart Cath and Coronary Angiography;  Surgeon: Dorn JINNY Lesches, MD;  [facility]: [facility];  Service: Cardiovascular;  Laterality: N/A;   CARDIAC CATHETERIZATION N/A 06/17/2016   Procedure: Coronary Stent Intervention;  Surgeon: Dorn JINNY Lesches, MD;  Location: [facility];  Service: Cardiovascular;  Laterality: N/A;   cardiac stents     CATARACT EXTRACTION W/PHACO Right 05/30/2024   Procedure: PHACOEMULSIFICATION, CATARACT, WITH  IOL INSERTION 2.49 00:23.5;  Surgeon: Myrna Adine Anes, MD;  Location: [facility];  Service: Ophthalmology;  Laterality: Right;   CATARACT EXTRACTION W/PHACO Left 06/13/2024   Procedure: PHACOEMULSIFICATION, CATARACT, WITH IOL INSERTION 2.52 00:27.8;  Surgeon: Myrna Adine Anes, MD;  Location: [facility];  Service: Ophthalmology;  Laterality: Left;   CERVICAL SPINE SURGERY     LOWER EXTREMITY ANGIOGRAPHY Left 06/25/2020   Procedure: LOWER EXTREMITY ANGIOGRAPHY;  Surgeon: Marea Selinda RAMAN, MD;  Location: [facility];  Service: Cardiovascular;  Laterality: Left;   LOWER EXTREMITY ANGIOGRAPHY Left 03/11/2023   Procedure: Lower Extremity Angiography;  Surgeon: Marea Selinda RAMAN, MD;  Location: [facility];  Service: Cardiovascular;  Laterality: Left;    Family History: Family History  Problem Relation Age of Onset   CAD Paternal Grandfather    Breast cancer Mother    Parkinson's disease Father    Alzheimer's disease Father    Hypertension Father    Diabetes Father     Social History:  reports that he has been smoking cigarettes. He has never used smokeless tobacco. He reports that he does not currently use alcohol. He reports current drug use. Frequency: 1.00 time per week. Drug: Marijuana.      Physical Exam: There were no vitals taken for this visit.   Constitutional:  Alert and oriented, No acute distress. Cardiovascular: No clubbing, cyanosis, or edema. Respiratory: Normal respiratory effort, no increased  work of breathing. GI: Nondistended GU: *** Skin: No rashes, bruises or suspicious lesions. Neurologic: Grossly intact, no focal deficits, moving all 4 extremities. Psychiatric: Normal mood and affect.  Laboratory Data: Component Ref Range & Units 2 mo ago  PSA (Prostate Specific Antigen), Total <=2.99 ng/mL 0.24    Latest Reference Range & Units 07/23/22 04:23 07/24/22 06:34 07/25/22 05:13 07/26/22 07:47 03/10/23 09:16 03/10/23 16:51 03/11/23  07:16 03/12/23 06:01 05/21/24 10:43  Creatinine 0.61 - 1.24 mg/dL 8.84 8.83 8.79 8.89 8.71 (H) 1.27 (H) 1.11 1.11 1.38 (H)  (H): Data is abnormally high  Pertinent Imaging: I have personally viewed and interpreted the CT Stone (05/21/24) - no hydronephrosis, no renal stones. Bladder has dependent layering debris likely intravesical clot, cannot exclude underlying lesion/mass. Prostate ~20g by CT estimation. .    Assessment & Plan:    Gross hematuria Assessment & Plan: Hx of recurrent GH w/ intravesical clot  - on ASA /Eliquis   - CT Stone - no hydro, ~20g gland Significant CV comorbidity   - Plan for CT Urogram, Cr ~1.2  - Followed by office cystoscopy + cytology            Penne Skye, MD 06/14/2024  [facility] [address] [place], [place] [address] [phone]

## 2024-06-14 NOTE — Assessment & Plan Note (Signed)
 Hx of recurrent GH w/ intravesical clot  - on ASA /Eliquis   - CT Stone - no hydro, ~20g gland Significant CV comorbidity   - Plan for CT Urogram, Cr ~1.2  - Followed by office cystoscopy + cytology

## 2024-06-16 ENCOUNTER — Encounter: Payer: Self-pay | Admitting: Urology

## 2024-06-16 ENCOUNTER — Ambulatory Visit: Admitting: Urology

## 2024-06-16 ENCOUNTER — Other Ambulatory Visit: Payer: Self-pay | Admitting: Orthopedic Surgery

## 2024-06-16 VITALS — BP 128/78 | HR 83 | Ht 67.0 in | Wt 173.4 lb

## 2024-06-16 DIAGNOSIS — R310 Gross hematuria: Secondary | ICD-10-CM

## 2024-06-16 DIAGNOSIS — M25312 Other instability, left shoulder: Secondary | ICD-10-CM

## 2024-06-16 DIAGNOSIS — S46002A Unspecified injury of muscle(s) and tendon(s) of the rotator cuff of left shoulder, initial encounter: Secondary | ICD-10-CM

## 2024-06-16 LAB — MICROSCOPIC EXAMINATION

## 2024-06-16 LAB — URINALYSIS, COMPLETE
Bilirubin, UA: NEGATIVE
Ketones, UA: NEGATIVE
Leukocytes,UA: NEGATIVE
Nitrite, UA: NEGATIVE
Protein,UA: NEGATIVE
Specific Gravity, UA: 1.015 (ref 1.005–1.030)
Urobilinogen, Ur: 0.2 mg/dL (ref 0.2–1.0)
pH, UA: 6 (ref 5.0–7.5)

## 2024-06-16 NOTE — Addendum Note (Signed)
 Addended by: Georgetta Crafton E on: 06/16/2024 09:05 AM   Modules accepted: Orders

## 2024-06-16 NOTE — Patient Instructions (Signed)
 Call to schedule imaging appointment [phone].  Cystoscopy Cystoscopy is a procedure that is used to help diagnose and sometimes treat conditions that affect the lower urinary tract. The lower urinary tract includes the bladder and the urethra. The urethra is the tube that drains urine from the bladder. Cystoscopy is done using a thin, tube-shaped instrument with a light and camera at the end (cystoscope). The cystoscope may be hard or flexible, depending on the goal of the procedure. The cystoscope is inserted through the urethra, into the bladder. Cystoscopy may be recommended if you have: Urinary tract infections that keep coming back. Blood in the urine (hematuria). An inability to control when you urinate (urinary incontinence) or an overactive bladder. Unusual cells found in a urine sample. A blockage in the urethra, such as a urinary stone. Painful urination. An abnormality in the bladder found during an intravenous pyelogram (IVP) or CT scan. What are the risks? Generally, this is a safe procedure. However, problems may occur, including: Infection. Bleeding.  What happens during the procedure?  You will be given one or more of the following: A medicine to numb the area (local anesthetic). The area around the opening of your urethra will be cleaned. The cystoscope will be passed through your urethra into your bladder. Germ-free (sterile) fluid will flow through the cystoscope to fill your bladder. The fluid will stretch your bladder so that your health care provider can clearly examine your bladder walls. Your doctor will look at the urethra and bladder. The cystoscope will be removed The procedure may vary among health care providers  What can I expect after the procedure? After the procedure, it is common to have: Some soreness or pain in your urethra. Urinary symptoms. These include: Mild pain or burning when you urinate. Pain should stop within a few minutes after you  urinate. This may last for up to a few days after the procedure. A small amount of blood in your urine for several days. Feeling like you need to urinate but producing only a small amount of urine. Follow these instructions at home: General instructions Return to your normal activities as told by your health care provider.  Drink plenty of fluids after the procedure. Keep all follow-up visits as told by your health care provider. This is important. Contact a health care provider if you: Have pain that gets worse or does not get better with medicine, especially pain when you urinate lasting longer than 72 hours after the procedure. Have trouble urinating. Get help right away if you: Have blood clots in your urine. Have a fever or chills. Are unable to urinate. Summary Cystoscopy is a procedure that is used to help diagnose and sometimes treat conditions that affect the lower urinary tract. Cystoscopy is done using a thin, tube-shaped instrument with a light and camera at the end. After the procedure, it is common to have some soreness or pain in your urethra. It is normal to have blood in your urine after the procedure.  If you were prescribed an antibiotic medicine, take it as told by your health care provider.  This information is not intended to replace advice given to you by your health care provider. Make sure you discuss any questions you have with your health care provider. Document Revised: 06/15/2018 Document Reviewed: 06/15/2018 Elsevier Patient Education  2020 [employer].

## 2024-06-22 ENCOUNTER — Other Ambulatory Visit (INDEPENDENT_AMBULATORY_CARE_PROVIDER_SITE_OTHER)

## 2024-06-22 ENCOUNTER — Ambulatory Visit (INDEPENDENT_AMBULATORY_CARE_PROVIDER_SITE_OTHER): Admitting: Nurse Practitioner

## 2024-06-22 ENCOUNTER — Other Ambulatory Visit (INDEPENDENT_AMBULATORY_CARE_PROVIDER_SITE_OTHER): Payer: Self-pay | Admitting: Nurse Practitioner

## 2024-06-22 ENCOUNTER — Encounter (INDEPENDENT_AMBULATORY_CARE_PROVIDER_SITE_OTHER): Payer: Self-pay | Admitting: Nurse Practitioner

## 2024-06-22 VITALS — BP 102/64 | HR 76 | Resp 16 | Ht 67.0 in | Wt 176.6 lb

## 2024-06-22 DIAGNOSIS — Z9889 Other specified postprocedural states: Secondary | ICD-10-CM

## 2024-06-22 DIAGNOSIS — I739 Peripheral vascular disease, unspecified: Secondary | ICD-10-CM

## 2024-06-22 DIAGNOSIS — Z720 Tobacco use: Secondary | ICD-10-CM

## 2024-06-22 DIAGNOSIS — I10 Essential (primary) hypertension: Secondary | ICD-10-CM | POA: Diagnosis not present

## 2024-06-22 DIAGNOSIS — I70222 Atherosclerosis of native arteries of extremities with rest pain, left leg: Secondary | ICD-10-CM | POA: Diagnosis not present

## 2024-06-22 DIAGNOSIS — R6889 Other general symptoms and signs: Secondary | ICD-10-CM

## 2024-06-22 MED ORDER — OXYCODONE HCL 5 MG PO TABS: 5.0000 mg | ORAL_TABLET | Freq: Three times a day (TID) | ORAL | 0 refills | Status: DC | PRN

## 2024-06-22 NOTE — Progress Notes (Signed)
 Subjective:    Patient ID: Cameron [name], male    DOB: [birth year], 63 y.o.   MRN: [id number] Chief Complaint  Patient presents with   New Patient (Initial Visit)    np. ABI + consult. PAD.     HPI  Discussed the use of AI scribe software for clinical note transcription with the patient, who gave verbal consent to proceed.  History of Present Illness Cameron [name] is a 63 year old male with peripheral arterial disease who presents for follow-up of his left lower extremity symptoms.  His left leg symptoms have returned to the same or worse state as before his last intervention in September 2024. He experiences burning and tingling sensations, describing it as 'like it's on fire,' which is constant and worsens at night. These symptoms have been progressively worsening over the past six months.  He has been taking over-the-counter medications such as Advil, Tylenol , and BC arthritis, but they no longer alleviate the pain. He also tried a roll-on gel for neuropathy, which provided temporary relief. He is currently taking gabapentin, which initially helped with numbness, but the burning sensation persists.  He can walk less than 100 feet before experiencing significant claudication. He has been spending most of his time sitting and rubbing his foot, as any activity, including applying heat, exacerbates the pain.  His ankle-brachial index (ABI) was measured at 0.60 in the left leg, compared to a previous measurement of 0.51. He lives alone and relies on [place] or insurance-provided transportation for mobility. He has crutches and walkers at home to assist with movement.  He reports difficulty sleeping due to the burning pain, stating that he wakes up frequently at night. His foot and toes remain cold, despite attempts to warm them, and he can feel a temperature difference from his leg to his foot.    Results Diagnostic Left lower extremity angiogram (03/11/2023): Acute ischemia;  treated with endovascular intervention Right lower extremity ABI (06/22/2024): 0.93 unchanged from 0.93 on 03/10/2023 Left lower extremity ABI (06/22/2024): 0.60 increased from 0.51 on 03/10/2023   Review of Systems  Cardiovascular:        Claudication and rest pain   Neurological:  Positive for numbness.  All other systems reviewed and are negative.      Objective:   Physical Exam Vitals reviewed.  HENT:     Head: Normocephalic.  Cardiovascular:     Rate and Rhythm: Normal rate.     Pulses:          Dorsalis pedis pulses are detected w/ Doppler on the right side and detected w/ Doppler on the left side.       Posterior tibial pulses are detected w/ Doppler on the right side.  Pulmonary:     Effort: Pulmonary effort is normal.  Skin:    General: Skin is dry.     Comments: Left foot cool  Neurological:     Mental Status: He is alert and oriented to person, place, and time.  Psychiatric:        Mood and Affect: Mood normal.        Behavior: Behavior normal.        Thought Content: Thought content normal.        Judgment: Judgment normal.     Physical Exam   BP 102/64   Pulse 76   Resp 16   Ht 5' 7 (1.702 m)   Wt 176 lb 9.6 oz (80.1 kg)   BMI 27.66 kg/m  Past Medical History:  Diagnosis Date   Arthritis    CHF (congestive heart failure) (HCC)    Coronary artery disease    a. 12/17 PCI with DES to pLAD with resdiual disease (RX therapy)   Depression    Global hypokinesis of left ventricle    Gout    Grade II diastolic dysfunction    History of kidney stones    History of percutaneous coronary intervention 2017   Hyperlipidemia    Hypertension    Left atrial dilation    Neuromuscular disorder (HCC)    leg numb/ can walk by self   PAD (peripheral artery disease)    2 stents in left leg   Right atrial dilation    S/P drug eluting coronary stent placement 2017   STEMI (ST elevation myocardial infarction) (HCC) 2017   Substance abuse (HCC)    alcohol  and marijuana    Social History   Socioeconomic History   Marital status: Divorced    Spouse name: Not on file   Number of children: Not on file   Years of education: Not on file   Highest education level: Not on file  Occupational History   Not on file  Tobacco Use   Smoking status: Every Day    Current packs/day: 1.00    Types: Cigarettes   Smokeless tobacco: Never  Vaping Use   Vaping status: Never Used  Substance and Sexual Activity   Alcohol use: Not Currently   Drug use: Yes    Frequency: 1.0 times per week    Types: Marijuana    Comment: twice monthly   Sexual activity: Not Currently  Other Topics Concern   Not on file  Social History Narrative   Not on file   Social Drivers of Health   Tobacco Use: High Risk (06/22/2024)   Patient History    Smoking Tobacco Use: Every Day    Smokeless Tobacco Use: Never    Passive Exposure: Not on file  Financial Resource Strain: Low Risk  (03/24/2024)   Received from [facility]   Overall Financial Resource Strain (CARDIA)    Difficulty of Paying Living Expenses: Not hard at all  Food Insecurity: Food Insecurity Present (03/24/2024)   Received from [facility]   Epic    Within the past 12 months, you worried that your food would run out before you got the money to buy more.: Never true    Within the past 12 months, the food you bought just didn't last and you didn't have money to get more.: Sometimes true  Transportation Needs: No Transportation Needs (03/24/2024)   Received from [facility] - Transportation    In the past 12 months, has lack of transportation kept you from medical appointments or from getting medications?: No    Lack of Transportation (Non-Medical): No  Physical Activity: Not on file  Stress: Not on file  Social Connections: Not on file  Intimate Partner Violence: Not At Risk (03/10/2023)   Humiliation, Afraid, Rape, and Kick questionnaire     Fear of Current or Ex-Partner: No    Emotionally Abused: No    Physically Abused: No    Sexually Abused: No  Depression (PHQ2-9): Not on file  Alcohol Screen: Not on file  Housing: Unknown (03/24/2024)   Received from [facility]   Epic    In the last 12 months, was there a time when you were not able  to pay the mortgage or rent on time?: No    Number of Times Moved in the Last Year: Not on file    At any time in the past 12 months, were you homeless or living in a shelter (including now)?: No  Utilities: Not At Risk (03/24/2024)   Received from [facility]   Epic    In the past 12 months has the electric, gas, oil, or water company threatened to shut off services in your home?: No  Health Literacy: Not on file    Past Surgical History:  Procedure Laterality Date   CARDIAC CATHETERIZATION N/A 06/17/2016   Procedure: Left Heart Cath and Coronary Angiography;  Surgeon: Dorn JINNY Lesches, MD;  Location: [facility] INVASIVE CV LAB;  Service: Cardiovascular;  Laterality: N/A;   CARDIAC CATHETERIZATION N/A 06/17/2016   Procedure: Coronary Stent Intervention;  Surgeon: Dorn JINNY Lesches, MD;  Location: [facility] INVASIVE CV LAB;  Service: Cardiovascular;  Laterality: N/A;   cardiac stents     CATARACT EXTRACTION W/PHACO Right 05/30/2024   Procedure: PHACOEMULSIFICATION, CATARACT, WITH IOL INSERTION 2.49 00:23.5;  Surgeon: Myrna Adine Anes, MD;  Location: [facility];  Service: Ophthalmology;  Laterality: Right;   CATARACT EXTRACTION W/PHACO Left 06/13/2024   Procedure: PHACOEMULSIFICATION, CATARACT, WITH IOL INSERTION 2.52 00:27.8;  Surgeon: Myrna Adine Anes, MD;  Location: [facility];  Service: Ophthalmology;  Laterality: Left;   CERVICAL SPINE SURGERY     LOWER EXTREMITY ANGIOGRAPHY Left 06/25/2020   Procedure: LOWER EXTREMITY ANGIOGRAPHY;  Surgeon: Marea Selinda RAMAN, MD;  Location: [facility] INVASIVE CV LAB;  Service: Cardiovascular;  Laterality: Left;   LOWER  EXTREMITY ANGIOGRAPHY Left 03/11/2023   Procedure: Lower Extremity Angiography;  Surgeon: Marea Selinda RAMAN, MD;  Location: [facility] INVASIVE CV LAB;  Service: Cardiovascular;  Laterality: Left;    Family History  Problem Relation Age of Onset   CAD Paternal Grandfather    Breast cancer Mother    Parkinson's disease Father    Alzheimer's disease Father    Hypertension Father    Diabetes Father     Allergies[1]     Latest Ref Rng & Units 05/21/2024   10:43 AM 03/12/2023    6:01 AM 03/11/2023    7:16 AM  CBC  WBC 4.0 - 10.5 K/uL 12.2  6.1  5.7   Hemoglobin 13.0 - 17.0 g/dL 84.3  9.9  89.7   Hematocrit 39.0 - 52.0 % 46.9  32.8  33.1   Platelets 150 - 400 K/uL 130  148  144       CMP     Component Value Date/Time   NA 137 05/21/2024 1043   NA 139 03/28/2014 0823   K 3.7 05/21/2024 1043   K 3.4 (L) 03/28/2014 0823   CL 100 05/21/2024 1043   CL 111 (H) 03/28/2014 0823   CO2 25 05/21/2024 1043   CO2 22 03/28/2014 0823   GLUCOSE 115 (H) 05/21/2024 1043   GLUCOSE 106 (H) 03/28/2014 0823   BUN 16 05/21/2024 1043   BUN 8 03/28/2014 0823   CREATININE 1.38 (H) 05/21/2024 1043   CREATININE 0.97 03/28/2014 0823   CALCIUM  9.3 05/21/2024 1043   CALCIUM  7.8 (L) 03/28/2014 0823   PROT 7.5 05/21/2024 1043   PROT 7.9 03/24/2014 0850   ALBUMIN  4.4 05/21/2024 1043   ALBUMIN  2.9 (L) 03/24/2014 0850   AST 28 05/21/2024 1043   AST 49 (H) 03/24/2014 0850   ALT 27 05/21/2024 1043   ALT 15 03/24/2014 0850  ALKPHOS 129 (H) 05/21/2024 1043   ALKPHOS 43 (L) 03/24/2014 0850   BILITOT 0.4 05/21/2024 1043   BILITOT 0.5 03/24/2014 0850   GFRNONAA 58 (L) 05/21/2024 1043   GFRNONAA >60 03/28/2014 0823     No results found.     Assessment & Plan:   1. Peripheral arterial disease with history of revascularization (Primary) Peripheral arterial disease of the left lower extremity with claudication and history of revascularization Chronic peripheral arterial disease with worsening claudication and  persistent ischemia. ABI 0.60. Pain management complicated by sedative effects of medications. Concerns about post-procedure mobility. - Continue gabapentin for neuropathic pain. - Discussed potential use of capsaicin cream for symptomatic relief. - Discussed pain management options, including prescription medication for sleep.  Recommend:  The patient has evidence of severe atherosclerotic changes of both lower extremities with rest pain that is associated with preulcerative changes and impending tissue loss of the left foot.  This represents a limb threatening ischemia and places the patient at the risk for left limb loss.  Patient should undergo angiography of the left lower extremity with the hope for intervention for limb salvage.  The risks and benefits as well as the alternative therapies was discussed in detail with the patient.  All questions were answered.  Patient agrees to proceed with left lower extremity angiography.  The patient will follow up with me in the office after the procedure.      2. Essential hypertension Continue antihypertensive medications as already ordered, these medications have been reviewed and there are no changes at this time.  3. Tobacco abuse Smoking cessation was discussed, 3-10 minutes spent on this topic specificallySmoking cessation was discussed, 3-10 minutes spent on this topic specifically   Assessment and Plan Assessment & Plan      Medications Ordered Prior to Encounter[2]  There are no Patient Instructions on file for this visit. No follow-ups on file.   Conna Terada E Shaw Dobek, NP      [1] No Known Allergies [2]  Current Outpatient Medications on File Prior to Visit  Medication Sig Dispense Refill   acetaminophen  (TYLENOL ) 325 MG tablet Take 2 tablets (650 mg total) by mouth every 6 (six) hours as needed for moderate pain, mild pain, fever or headache.     allopurinol  (ZYLOPRIM ) 300 MG tablet Take 300 mg by mouth daily.     aspirin   EC 81 MG tablet Take 1 tablet (81 mg total) by mouth daily. Swallow whole. 30 tablet 12   atorvastatin  (LIPITOR ) 80 MG tablet Take 1 tablet (80 mg total) by mouth daily. (Patient taking differently: Take 40 mg by mouth daily.)     ELIQUIS  5 MG TABS tablet Take 1 tablet by mouth 2 (two) times daily.     gabapentin (NEURONTIN) 300 MG capsule Take 300 mg by mouth 3 (three) times daily.     hydrocortisone  (ANUSOL -HC) 25 MG suppository One suppository twice a day as needed for hemorrhoids.  (Generic okay to substitute) 12 suppository 0   ibuprofen (ADVIL) 200 MG tablet Take 200 mg by mouth every 6 (six) hours as needed for headache or mild pain.     metoprolol  succinate (TOPROL -XL) 25 MG 24 hr tablet Take 12.5 mg by mouth.     nicotine  (NICODERM CQ  - DOSED IN MG/24 HOURS) 14 mg/24hr patch One 14 mg patch chest wall daily as needed for nicotine  craving.  (Can substitute generic) 28 patch 0   nitroGLYCERIN  (NITROSTAT ) 0.4 MG SL tablet Place under the tongue.  pantoprazole  (PROTONIX ) 40 MG tablet Take 1 tablet (40 mg total) by mouth daily. 30 tablet 0   Polyethylene Glycol 4500 POWD 1 application  by Does not apply route as needed.     sertraline (ZOLOFT) 50 MG tablet Take 50 mg by mouth daily.     spironolactone  (ALDACTONE ) 25 MG tablet Take 25 mg by mouth 2 (two) times daily.     colchicine  0.6 MG tablet Take 0.6 mg by mouth.     collagenase  (SANTYL ) 250 UNIT/GM ointment Apply topically.     dapagliflozin  propanediol (FARXIGA ) 10 MG TABS tablet Take 10 mg by mouth daily.     furosemide  (LASIX ) 40 MG tablet Take 40 mg by mouth 2 (two) times daily.     predniSONE  (DELTASONE ) 20 MG tablet Take 20 mg by mouth 2 (two) times daily. (Patient not taking: Reported on 06/22/2024)     [DISCONTINUED] isosorbide  mononitrate (IMDUR ) 60 MG 24 hr tablet Take 1 tablet (60 mg total) by mouth daily. 30 tablet 1   No current facility-administered medications on file prior to visit.

## 2024-06-22 NOTE — H&P (View-Only) (Signed)
 Subjective:    Patient ID: Cameron [name], male    DOB: [birth year], 63 y.o.   MRN: [id number] Chief Complaint  Patient presents with   New Patient (Initial Visit)    np. ABI + consult. PAD.     HPI  Discussed the use of AI scribe software for clinical note transcription with the patient, who gave verbal consent to proceed.  History of Present Illness Cameron [name] is a 63 year old male with peripheral arterial disease who presents for follow-up of his left lower extremity symptoms.  His left leg symptoms have returned to the same or worse state as before his last intervention in September 2024. He experiences burning and tingling sensations, describing it as 'like it's on fire,' which is constant and worsens at night. These symptoms have been progressively worsening over the past six months.  He has been taking over-the-counter medications such as Advil, Tylenol , and BC arthritis, but they no longer alleviate the pain. He also tried a roll-on gel for neuropathy, which provided temporary relief. He is currently taking gabapentin, which initially helped with numbness, but the burning sensation persists.  He can walk less than 100 feet before experiencing significant claudication. He has been spending most of his time sitting and rubbing his foot, as any activity, including applying heat, exacerbates the pain.  His ankle-brachial index (ABI) was measured at 0.60 in the left leg, compared to a previous measurement of 0.51. He lives alone and relies on [place] or insurance-provided transportation for mobility. He has crutches and walkers at home to assist with movement.  He reports difficulty sleeping due to the burning pain, stating that he wakes up frequently at night. His foot and toes remain cold, despite attempts to warm them, and he can feel a temperature difference from his leg to his foot.    Results Diagnostic Left lower extremity angiogram (03/11/2023): Acute ischemia;  treated with endovascular intervention Right lower extremity ABI (06/22/2024): 0.93 unchanged from 0.93 on 03/10/2023 Left lower extremity ABI (06/22/2024): 0.60 increased from 0.51 on 03/10/2023   Review of Systems  Cardiovascular:        Claudication and rest pain   Neurological:  Positive for numbness.  All other systems reviewed and are negative.      Objective:   Physical Exam Vitals reviewed.  HENT:     Head: Normocephalic.  Cardiovascular:     Rate and Rhythm: Normal rate.     Pulses:          Dorsalis pedis pulses are detected w/ Doppler on the right side and detected w/ Doppler on the left side.       Posterior tibial pulses are detected w/ Doppler on the right side.  Pulmonary:     Effort: Pulmonary effort is normal.  Skin:    General: Skin is dry.     Comments: Left foot cool  Neurological:     Mental Status: He is alert and oriented to person, place, and time.  Psychiatric:        Mood and Affect: Mood normal.        Behavior: Behavior normal.        Thought Content: Thought content normal.        Judgment: Judgment normal.     Physical Exam   BP 102/64   Pulse 76   Resp 16   Ht 5' 7 (1.702 m)   Wt 176 lb 9.6 oz (80.1 kg)   BMI 27.66 kg/m  Past Medical History:  Diagnosis Date   Arthritis    CHF (congestive heart failure) (HCC)    Coronary artery disease    a. 12/17 PCI with DES to pLAD with resdiual disease (RX therapy)   Depression    Global hypokinesis of left ventricle    Gout    Grade II diastolic dysfunction    History of kidney stones    History of percutaneous coronary intervention 2017   Hyperlipidemia    Hypertension    Left atrial dilation    Neuromuscular disorder (HCC)    leg numb/ can walk by self   PAD (peripheral artery disease)    2 stents in left leg   Right atrial dilation    S/P drug eluting coronary stent placement 2017   STEMI (ST elevation myocardial infarction) (HCC) 2017   Substance abuse (HCC)    alcohol  and marijuana    Social History   Socioeconomic History   Marital status: Divorced    Spouse name: Not on file   Number of children: Not on file   Years of education: Not on file   Highest education level: Not on file  Occupational History   Not on file  Tobacco Use   Smoking status: Every Day    Current packs/day: 1.00    Types: Cigarettes   Smokeless tobacco: Never  Vaping Use   Vaping status: Never Used  Substance and Sexual Activity   Alcohol use: Not Currently   Drug use: Yes    Frequency: 1.0 times per week    Types: Marijuana    Comment: twice monthly   Sexual activity: Not Currently  Other Topics Concern   Not on file  Social History Narrative   Not on file   Social Drivers of Health   Tobacco Use: High Risk (06/22/2024)   Patient History    Smoking Tobacco Use: Every Day    Smokeless Tobacco Use: Never    Passive Exposure: Not on file  Financial Resource Strain: Low Risk  (03/24/2024)   Received from [facility]   Overall Financial Resource Strain (CARDIA)    Difficulty of Paying Living Expenses: Not hard at all  Food Insecurity: Food Insecurity Present (03/24/2024)   Received from [facility]   Epic    Within the past 12 months, you worried that your food would run out before you got the money to buy more.: Never true    Within the past 12 months, the food you bought just didn't last and you didn't have money to get more.: Sometimes true  Transportation Needs: No Transportation Needs (03/24/2024)   Received from [facility] - Transportation    In the past 12 months, has lack of transportation kept you from medical appointments or from getting medications?: No    Lack of Transportation (Non-Medical): No  Physical Activity: Not on file  Stress: Not on file  Social Connections: Not on file  Intimate Partner Violence: Not At Risk (03/10/2023)   Humiliation, Afraid, Rape, and Kick questionnaire     Fear of Current or Ex-Partner: No    Emotionally Abused: No    Physically Abused: No    Sexually Abused: No  Depression (PHQ2-9): Not on file  Alcohol Screen: Not on file  Housing: Unknown (03/24/2024)   Received from [facility]   Epic    In the last 12 months, was there a time when you were not able  to pay the mortgage or rent on time?: No    Number of Times Moved in the Last Year: Not on file    At any time in the past 12 months, were you homeless or living in a shelter (including now)?: No  Utilities: Not At Risk (03/24/2024)   Received from [facility]   Epic    In the past 12 months has the electric, gas, oil, or water company threatened to shut off services in your home?: No  Health Literacy: Not on file    Past Surgical History:  Procedure Laterality Date   CARDIAC CATHETERIZATION N/A 06/17/2016   Procedure: Left Heart Cath and Coronary Angiography;  Surgeon: Dorn JINNY Lesches, MD;  Location: [facility] INVASIVE CV LAB;  Service: Cardiovascular;  Laterality: N/A;   CARDIAC CATHETERIZATION N/A 06/17/2016   Procedure: Coronary Stent Intervention;  Surgeon: Dorn JINNY Lesches, MD;  Location: [facility] INVASIVE CV LAB;  Service: Cardiovascular;  Laterality: N/A;   cardiac stents     CATARACT EXTRACTION W/PHACO Right 05/30/2024   Procedure: PHACOEMULSIFICATION, CATARACT, WITH IOL INSERTION 2.49 00:23.5;  Surgeon: Myrna Adine Anes, MD;  Location: [facility];  Service: Ophthalmology;  Laterality: Right;   CATARACT EXTRACTION W/PHACO Left 06/13/2024   Procedure: PHACOEMULSIFICATION, CATARACT, WITH IOL INSERTION 2.52 00:27.8;  Surgeon: Myrna Adine Anes, MD;  Location: [facility];  Service: Ophthalmology;  Laterality: Left;   CERVICAL SPINE SURGERY     LOWER EXTREMITY ANGIOGRAPHY Left 06/25/2020   Procedure: LOWER EXTREMITY ANGIOGRAPHY;  Surgeon: Marea Selinda RAMAN, MD;  Location: [facility] INVASIVE CV LAB;  Service: Cardiovascular;  Laterality: Left;   LOWER  EXTREMITY ANGIOGRAPHY Left 03/11/2023   Procedure: Lower Extremity Angiography;  Surgeon: Marea Selinda RAMAN, MD;  Location: [facility] INVASIVE CV LAB;  Service: Cardiovascular;  Laterality: Left;    Family History  Problem Relation Age of Onset   CAD Paternal Grandfather    Breast cancer Mother    Parkinson's disease Father    Alzheimer's disease Father    Hypertension Father    Diabetes Father     Allergies[1]     Latest Ref Rng & Units 05/21/2024   10:43 AM 03/12/2023    6:01 AM 03/11/2023    7:16 AM  CBC  WBC 4.0 - 10.5 K/uL 12.2  6.1  5.7   Hemoglobin 13.0 - 17.0 g/dL 84.3  9.9  89.7   Hematocrit 39.0 - 52.0 % 46.9  32.8  33.1   Platelets 150 - 400 K/uL 130  148  144       CMP     Component Value Date/Time   NA 137 05/21/2024 1043   NA 139 03/28/2014 0823   K 3.7 05/21/2024 1043   K 3.4 (L) 03/28/2014 0823   CL 100 05/21/2024 1043   CL 111 (H) 03/28/2014 0823   CO2 25 05/21/2024 1043   CO2 22 03/28/2014 0823   GLUCOSE 115 (H) 05/21/2024 1043   GLUCOSE 106 (H) 03/28/2014 0823   BUN 16 05/21/2024 1043   BUN 8 03/28/2014 0823   CREATININE 1.38 (H) 05/21/2024 1043   CREATININE 0.97 03/28/2014 0823   CALCIUM  9.3 05/21/2024 1043   CALCIUM  7.8 (L) 03/28/2014 0823   PROT 7.5 05/21/2024 1043   PROT 7.9 03/24/2014 0850   ALBUMIN  4.4 05/21/2024 1043   ALBUMIN  2.9 (L) 03/24/2014 0850   AST 28 05/21/2024 1043   AST 49 (H) 03/24/2014 0850   ALT 27 05/21/2024 1043   ALT 15 03/24/2014 0850  ALKPHOS 129 (H) 05/21/2024 1043   ALKPHOS 43 (L) 03/24/2014 0850   BILITOT 0.4 05/21/2024 1043   BILITOT 0.5 03/24/2014 0850   GFRNONAA 58 (L) 05/21/2024 1043   GFRNONAA >60 03/28/2014 0823     No results found.     Assessment & Plan:   1. Peripheral arterial disease with history of revascularization (Primary) Peripheral arterial disease of the left lower extremity with claudication and history of revascularization Chronic peripheral arterial disease with worsening claudication and  persistent ischemia. ABI 0.60. Pain management complicated by sedative effects of medications. Concerns about post-procedure mobility. - Continue gabapentin for neuropathic pain. - Discussed potential use of capsaicin cream for symptomatic relief. - Discussed pain management options, including prescription medication for sleep.  Recommend:  The patient has evidence of severe atherosclerotic changes of both lower extremities with rest pain that is associated with preulcerative changes and impending tissue loss of the left foot.  This represents a limb threatening ischemia and places the patient at the risk for left limb loss.  Patient should undergo angiography of the left lower extremity with the hope for intervention for limb salvage.  The risks and benefits as well as the alternative therapies was discussed in detail with the patient.  All questions were answered.  Patient agrees to proceed with left lower extremity angiography.  The patient will follow up with me in the office after the procedure.      2. Essential hypertension Continue antihypertensive medications as already ordered, these medications have been reviewed and there are no changes at this time.  3. Tobacco abuse Smoking cessation was discussed, 3-10 minutes spent on this topic specificallySmoking cessation was discussed, 3-10 minutes spent on this topic specifically   Assessment and Plan Assessment & Plan      Medications Ordered Prior to Encounter[2]  There are no Patient Instructions on file for this visit. No follow-ups on file.   Conna Terada E Shaw Dobek, NP      [1] No Known Allergies [2]  Current Outpatient Medications on File Prior to Visit  Medication Sig Dispense Refill   acetaminophen  (TYLENOL ) 325 MG tablet Take 2 tablets (650 mg total) by mouth every 6 (six) hours as needed for moderate pain, mild pain, fever or headache.     allopurinol  (ZYLOPRIM ) 300 MG tablet Take 300 mg by mouth daily.     aspirin   EC 81 MG tablet Take 1 tablet (81 mg total) by mouth daily. Swallow whole. 30 tablet 12   atorvastatin  (LIPITOR ) 80 MG tablet Take 1 tablet (80 mg total) by mouth daily. (Patient taking differently: Take 40 mg by mouth daily.)     ELIQUIS  5 MG TABS tablet Take 1 tablet by mouth 2 (two) times daily.     gabapentin (NEURONTIN) 300 MG capsule Take 300 mg by mouth 3 (three) times daily.     hydrocortisone  (ANUSOL -HC) 25 MG suppository One suppository twice a day as needed for hemorrhoids.  (Generic okay to substitute) 12 suppository 0   ibuprofen (ADVIL) 200 MG tablet Take 200 mg by mouth every 6 (six) hours as needed for headache or mild pain.     metoprolol  succinate (TOPROL -XL) 25 MG 24 hr tablet Take 12.5 mg by mouth.     nicotine  (NICODERM CQ  - DOSED IN MG/24 HOURS) 14 mg/24hr patch One 14 mg patch chest wall daily as needed for nicotine  craving.  (Can substitute generic) 28 patch 0   nitroGLYCERIN  (NITROSTAT ) 0.4 MG SL tablet Place under the tongue.  pantoprazole  (PROTONIX ) 40 MG tablet Take 1 tablet (40 mg total) by mouth daily. 30 tablet 0   Polyethylene Glycol 4500 POWD 1 application  by Does not apply route as needed.     sertraline (ZOLOFT) 50 MG tablet Take 50 mg by mouth daily.     spironolactone  (ALDACTONE ) 25 MG tablet Take 25 mg by mouth 2 (two) times daily.     colchicine  0.6 MG tablet Take 0.6 mg by mouth.     collagenase  (SANTYL ) 250 UNIT/GM ointment Apply topically.     dapagliflozin  propanediol (FARXIGA ) 10 MG TABS tablet Take 10 mg by mouth daily.     furosemide  (LASIX ) 40 MG tablet Take 40 mg by mouth 2 (two) times daily.     predniSONE  (DELTASONE ) 20 MG tablet Take 20 mg by mouth 2 (two) times daily. (Patient not taking: Reported on 06/22/2024)     [DISCONTINUED] isosorbide  mononitrate (IMDUR ) 60 MG 24 hr tablet Take 1 tablet (60 mg total) by mouth daily. 30 tablet 1   No current facility-administered medications on file prior to visit.

## 2024-06-23 ENCOUNTER — Telehealth (INDEPENDENT_AMBULATORY_CARE_PROVIDER_SITE_OTHER): Payer: Self-pay

## 2024-06-23 NOTE — Telephone Encounter (Addendum)
 I spoke with the patient on 06/22/24 regarding being scheduled for a LLE angio with Dr. Marea on 06/27/24. Patient stated he would call back to make sure that the date would be okay. I did advise that being scheduled before the new year may be a problem if this appt was not taken. I called the patient to ask about the procedure and per the patient he wanted to have his procedure in January. Patient was scheduled for 07/11/24 with a 6:45 am arrival time to the [facility]. Pre-procedure instructions will be mailed.

## 2024-06-24 ENCOUNTER — Inpatient Hospital Stay: Admission: RE | Admit: 2024-06-24 | Source: Ambulatory Visit

## 2024-06-27 LAB — VAS US ABI WITH/WO TBI
Left ABI: 0.6
Right ABI: 0.93

## 2024-06-28 ENCOUNTER — Ambulatory Visit
Admission: RE | Admit: 2024-06-28 | Discharge: 2024-06-28 | Disposition: A | Discharge status: Home / Self Care | Source: Ambulatory Visit | Attending: Urology | Admitting: Urology

## 2024-06-28 DIAGNOSIS — R310 Gross hematuria: Secondary | ICD-10-CM | POA: Diagnosis present

## 2024-06-28 MED ORDER — IOHEXOL 300 MG/ML  SOLN
100.0000 mL | Freq: Once | INTRAMUSCULAR | Status: AC | PRN
  Administered 2024-06-28: 100 mL via INTRAVENOUS

## 2024-07-05 ENCOUNTER — Telehealth (INDEPENDENT_AMBULATORY_CARE_PROVIDER_SITE_OTHER): Payer: Self-pay

## 2024-07-05 NOTE — Telephone Encounter (Signed)
 I returned a call to the patient. Patient stated his sister has the flu and he had no one to bring him to his procedure on 07/11/24 with Dr. Marea. Patient then stated he could go through his insurance and have transportation set up. I advised he call his insurance and set that up.

## 2024-07-06 ENCOUNTER — Ambulatory Visit

## 2024-07-11 ENCOUNTER — Other Ambulatory Visit: Payer: Self-pay

## 2024-07-11 ENCOUNTER — Encounter: Payer: Self-pay | Admitting: Vascular Surgery

## 2024-07-11 ENCOUNTER — Encounter
Admission: AD | Disposition: A | Discharge status: Skilled Nursing Facility | Payer: Self-pay | Source: Home / Self Care | Attending: Vascular Surgery

## 2024-07-11 ENCOUNTER — Inpatient Hospital Stay
Admission: AD | Admit: 2024-07-11 | Discharge: 2024-07-20 | DRG: 271 | Disposition: A | Discharge status: Skilled Nursing Facility | Attending: Vascular Surgery | Admitting: Vascular Surgery

## 2024-07-11 DIAGNOSIS — R310 Gross hematuria: Secondary | ICD-10-CM | POA: Diagnosis present

## 2024-07-11 DIAGNOSIS — I2510 Atherosclerotic heart disease of native coronary artery without angina pectoris: Secondary | ICD-10-CM | POA: Diagnosis present

## 2024-07-11 DIAGNOSIS — N131 Hydronephrosis with ureteral stricture, not elsewhere classified: Secondary | ICD-10-CM | POA: Diagnosis present

## 2024-07-11 DIAGNOSIS — I5022 Chronic systolic (congestive) heart failure: Secondary | ICD-10-CM | POA: Diagnosis present

## 2024-07-11 DIAGNOSIS — Z95820 Peripheral vascular angioplasty status with implants and grafts: Secondary | ICD-10-CM | POA: Diagnosis not present

## 2024-07-11 DIAGNOSIS — Z5941 Food insecurity: Secondary | ICD-10-CM

## 2024-07-11 DIAGNOSIS — M109 Gout, unspecified: Secondary | ICD-10-CM | POA: Diagnosis present

## 2024-07-11 DIAGNOSIS — Z961 Presence of intraocular lens: Secondary | ICD-10-CM | POA: Diagnosis present

## 2024-07-11 DIAGNOSIS — Z803 Family history of malignant neoplasm of breast: Secondary | ICD-10-CM | POA: Diagnosis not present

## 2024-07-11 DIAGNOSIS — I252 Old myocardial infarction: Secondary | ICD-10-CM | POA: Diagnosis not present

## 2024-07-11 DIAGNOSIS — I480 Paroxysmal atrial fibrillation: Secondary | ICD-10-CM | POA: Diagnosis present

## 2024-07-11 DIAGNOSIS — N3289 Other specified disorders of bladder: Secondary | ICD-10-CM

## 2024-07-11 DIAGNOSIS — Z9889 Other specified postprocedural states: Secondary | ICD-10-CM

## 2024-07-11 DIAGNOSIS — Z8249 Family history of ischemic heart disease and other diseases of the circulatory system: Secondary | ICD-10-CM

## 2024-07-11 DIAGNOSIS — Z7901 Long term (current) use of anticoagulants: Secondary | ICD-10-CM | POA: Diagnosis not present

## 2024-07-11 DIAGNOSIS — I70222 Atherosclerosis of native arteries of extremities with rest pain, left leg: Secondary | ICD-10-CM | POA: Diagnosis present

## 2024-07-11 DIAGNOSIS — I110 Hypertensive heart disease with heart failure: Secondary | ICD-10-CM | POA: Diagnosis present

## 2024-07-11 DIAGNOSIS — I70229 Atherosclerosis of native arteries of extremities with rest pain, unspecified extremity: Principal | ICD-10-CM | POA: Diagnosis present

## 2024-07-11 DIAGNOSIS — E785 Hyperlipidemia, unspecified: Secondary | ICD-10-CM | POA: Diagnosis present

## 2024-07-11 DIAGNOSIS — T82856A Stenosis of peripheral vascular stent, initial encounter: Secondary | ICD-10-CM | POA: Diagnosis not present

## 2024-07-11 DIAGNOSIS — Z79899 Other long term (current) drug therapy: Secondary | ICD-10-CM | POA: Diagnosis not present

## 2024-07-11 DIAGNOSIS — M79605 Pain in left leg: Secondary | ICD-10-CM | POA: Diagnosis present

## 2024-07-11 DIAGNOSIS — Z955 Presence of coronary angioplasty implant and graft: Secondary | ICD-10-CM | POA: Diagnosis not present

## 2024-07-11 DIAGNOSIS — N329 Bladder disorder, unspecified: Secondary | ICD-10-CM | POA: Diagnosis present

## 2024-07-11 DIAGNOSIS — Z833 Family history of diabetes mellitus: Secondary | ICD-10-CM

## 2024-07-11 DIAGNOSIS — Z7982 Long term (current) use of aspirin: Secondary | ICD-10-CM | POA: Diagnosis not present

## 2024-07-11 DIAGNOSIS — N1330 Unspecified hydronephrosis: Secondary | ICD-10-CM | POA: Diagnosis not present

## 2024-07-11 DIAGNOSIS — F17210 Nicotine dependence, cigarettes, uncomplicated: Secondary | ICD-10-CM | POA: Diagnosis present

## 2024-07-11 DIAGNOSIS — Z604 Social exclusion and rejection: Secondary | ICD-10-CM | POA: Diagnosis present

## 2024-07-11 DIAGNOSIS — Z9842 Cataract extraction status, left eye: Secondary | ICD-10-CM

## 2024-07-11 DIAGNOSIS — Z820 Family history of epilepsy and other diseases of the nervous system: Secondary | ICD-10-CM

## 2024-07-11 DIAGNOSIS — G629 Polyneuropathy, unspecified: Secondary | ICD-10-CM | POA: Diagnosis present

## 2024-07-11 DIAGNOSIS — T82868A Thrombosis of vascular prosthetic devices, implants and grafts, initial encounter: Secondary | ICD-10-CM | POA: Diagnosis not present

## 2024-07-11 DIAGNOSIS — Z5989 Other problems related to housing and economic circumstances: Secondary | ICD-10-CM

## 2024-07-11 DIAGNOSIS — Z9841 Cataract extraction status, right eye: Secondary | ICD-10-CM

## 2024-07-11 DIAGNOSIS — I255 Ischemic cardiomyopathy: Secondary | ICD-10-CM | POA: Diagnosis present

## 2024-07-11 HISTORY — PX: LOWER EXTREMITY INTERVENTION: CATH118252

## 2024-07-11 LAB — COMPREHENSIVE METABOLIC PANEL WITH GFR
ALT: 12 U/L (ref 0–44)
AST: 22 U/L (ref 15–41)
Albumin: 4.1 g/dL (ref 3.5–5.0)
Alkaline Phosphatase: 121 U/L (ref 38–126)
Anion gap: 10 (ref 5–15)
BUN: 17 mg/dL (ref 8–23)
CO2: 28 mmol/L (ref 22–32)
Calcium: 9.8 mg/dL (ref 8.9–10.3)
Chloride: 104 mmol/L (ref 98–111)
Creatinine, Ser: 1.25 mg/dL — ABNORMAL HIGH (ref 0.61–1.24)
GFR, Estimated: >60 mL/min
Glucose, Bld: 101 mg/dL — ABNORMAL HIGH (ref 70–99)
Potassium: 3.9 mmol/L (ref 3.5–5.1)
Sodium: 142 mmol/L (ref 135–145)
Total Bilirubin: 0.4 mg/dL (ref 0.0–1.2)
Total Protein: 7 g/dL (ref 6.5–8.1)

## 2024-07-11 LAB — URINALYSIS, ROUTINE W REFLEX MICROSCOPIC
Bilirubin Urine: NEGATIVE
Glucose, UA: >=500 mg/dL — AB
Ketones, ur: NEGATIVE mg/dL
Leukocytes,Ua: NEGATIVE
Nitrite: NEGATIVE
Protein, ur: 100 mg/dL — AB
Specific Gravity, Urine: 1.028 (ref 1.005–1.030)
Squamous Epithelial / HPF: 0 /HPF (ref 0–5)
pH: 7 (ref 5.0–8.0)

## 2024-07-11 LAB — CBC
HCT: 44.8 % (ref 39.0–52.0)
Hemoglobin: 14.8 g/dL (ref 13.0–17.0)
MCH: 30.8 pg (ref 26.0–34.0)
MCHC: 33 g/dL (ref 30.0–36.0)
MCV: 93.3 fL (ref 80.0–100.0)
Platelets: 118 K/uL — ABNORMAL LOW (ref 150–400)
RBC: 4.8 MIL/uL (ref 4.22–5.81)
RDW: 16.1 % — ABNORMAL HIGH (ref 11.5–15.5)
WBC: 7.8 K/uL (ref 4.0–10.5)
nRBC: 0 % (ref 0.0–0.2)

## 2024-07-11 LAB — BUN: BUN: 16 mg/dL (ref 8–23)

## 2024-07-11 LAB — CREATININE, SERUM
Creatinine, Ser: 1.21 mg/dL (ref 0.61–1.24)
GFR, Estimated: >60 mL/min

## 2024-07-11 LAB — PROTIME-INR
INR: 1 (ref 0.8–1.2)
Prothrombin Time: 13.5 s (ref 11.4–15.2)

## 2024-07-11 LAB — HIV ANTIBODY (ROUTINE TESTING W REFLEX): HIV Screen 4th Generation wRfx: NONREACTIVE

## 2024-07-11 MED ORDER — MAGNESIUM HYDROXIDE 400 MG/5ML PO SUSP: 30.0000 mL | Freq: Every day | ORAL | Status: DC | PRN

## 2024-07-11 MED ORDER — MORPHINE SULFATE (PF) 2 MG/ML IV SOLN
2.0000 mg | INTRAVENOUS | Status: DC | PRN
  Administered 2024-07-12 – 2024-07-15 (×6): 2 mg via INTRAVENOUS
  Filled 2024-07-11 (×6): qty 1

## 2024-07-11 MED ORDER — LIDOCAINE-EPINEPHRINE (PF) 1 %-1:200000 IJ SOLN
INTRAMUSCULAR | Status: DC | PRN
  Administered 2024-07-11: 10 mL

## 2024-07-11 MED ORDER — MEDIHONEY WOUND/BURN DRESSING EX PSTE: 1.0000 | PASTE | Freq: Every day | CUTANEOUS | Status: DC

## 2024-07-11 MED ORDER — IODIXANOL 320 MG/ML IV SOLN
INTRAVENOUS | Status: DC | PRN
  Administered 2024-07-11: 35 mL via INTRA_ARTERIAL

## 2024-07-11 MED ORDER — ONDANSETRON HCL 4 MG/2ML IJ SOLN: 4.0000 mg | Freq: Four times a day (QID) | INTRAMUSCULAR | Status: DC | PRN

## 2024-07-11 MED ORDER — FENTANYL CITRATE (PF) 100 MCG/2ML IJ SOLN
INTRAMUSCULAR | Status: AC
  Filled 2024-07-11: qty 2

## 2024-07-11 MED ORDER — HEPARIN (PORCINE) IN NACL 2000-0.9 UNIT/L-% IV SOLN
INTRAVENOUS | Status: DC | PRN
  Administered 2024-07-11: 1000 mL

## 2024-07-11 MED ORDER — ACETAMINOPHEN 325 MG PO TABS: 650.0000 mg | ORAL_TABLET | Freq: Four times a day (QID) | ORAL | Status: DC | PRN

## 2024-07-11 MED ORDER — FENTANYL CITRATE (PF) 100 MCG/2ML IJ SOLN
INTRAMUSCULAR | Status: DC | PRN
  Administered 2024-07-11: 50 ug via INTRAVENOUS

## 2024-07-11 MED ORDER — GUAIFENESIN-DM 100-10 MG/5ML PO SYRP: 15.0000 mL | ORAL_SOLUTION | ORAL | Status: DC | PRN

## 2024-07-11 MED ORDER — HYDROMORPHONE HCL 1 MG/ML IJ SOLN: 1.0000 mg | Freq: Once | INTRAMUSCULAR | Status: DC | PRN

## 2024-07-11 MED ORDER — LIDOCAINE HCL 1 % IJ SOLN
INTRAMUSCULAR | Status: AC
  Filled 2024-07-11: qty 20

## 2024-07-11 MED ORDER — DIPHENHYDRAMINE HCL 50 MG/ML IJ SOLN: 50.0000 mg | Freq: Once | INTRAMUSCULAR | Status: DC | PRN

## 2024-07-11 MED ORDER — CEFAZOLIN SODIUM-DEXTROSE 2-4 GM/100ML-% IV SOLN
2.0000 g | INTRAVENOUS | Status: AC
  Administered 2024-07-11: 2 g via INTRAVENOUS

## 2024-07-11 MED ORDER — OXYCODONE HCL 5 MG PO TABS
5.0000 mg | ORAL_TABLET | Freq: Three times a day (TID) | ORAL | Status: DC | PRN
  Administered 2024-07-12 – 2024-07-16 (×10): 5 mg via ORAL
  Filled 2024-07-11 (×14): qty 1

## 2024-07-11 MED ORDER — ASPIRIN 81 MG PO TBEC: 81.0000 mg | DELAYED_RELEASE_TABLET | Freq: Every day | ORAL | Status: DC

## 2024-07-11 MED ORDER — HYDRALAZINE HCL 20 MG/ML IJ SOLN: 5.0000 mg | INTRAMUSCULAR | Status: DC | PRN

## 2024-07-11 MED ORDER — PHENOL 1.4 % MT LIQD: 1.0000 | OROMUCOSAL | Status: DC | PRN

## 2024-07-11 MED ORDER — DOCUSATE SODIUM 100 MG PO CAPS
100.0000 mg | ORAL_CAPSULE | Freq: Two times a day (BID) | ORAL | Status: DC
  Administered 2024-07-11 – 2024-07-20 (×18): 100 mg via ORAL
  Filled 2024-07-11 (×18): qty 1

## 2024-07-11 MED ORDER — METOPROLOL SUCCINATE ER 25 MG PO TB24
12.5000 mg | ORAL_TABLET | Freq: Every day | ORAL | Status: DC
  Administered 2024-07-12 – 2024-07-18 (×5): 12.5 mg via ORAL
  Filled 2024-07-11 (×7): qty 1
  Filled 2024-07-11: qty 0.5

## 2024-07-11 MED ORDER — CEFAZOLIN SODIUM-DEXTROSE 2-4 GM/100ML-% IV SOLN
INTRAVENOUS | Status: AC
  Filled 2024-07-11: qty 100

## 2024-07-11 MED ORDER — COLCHICINE 0.6 MG PO TABS
0.6000 mg | ORAL_TABLET | Freq: Every day | ORAL | Status: DC
  Administered 2024-07-12 – 2024-07-20 (×9): 0.6 mg via ORAL
  Filled 2024-07-11 (×9): qty 1

## 2024-07-11 MED ORDER — FAMOTIDINE 20 MG PO TABS: 40.0000 mg | ORAL_TABLET | Freq: Once | ORAL | Status: DC | PRN

## 2024-07-11 MED ORDER — METOPROLOL TARTRATE 5 MG/5ML IV SOLN: 2.0000 mg | INTRAVENOUS | Status: DC | PRN

## 2024-07-11 MED ORDER — ATORVASTATIN CALCIUM 20 MG PO TABS
40.0000 mg | ORAL_TABLET | Freq: Every day | ORAL | Status: DC
  Administered 2024-07-12 – 2024-07-13 (×2): 40 mg via ORAL
  Filled 2024-07-11 (×2): qty 2

## 2024-07-11 MED ORDER — MIDAZOLAM HCL 2 MG/ML PO SYRP: 8.0000 mg | ORAL_SOLUTION | Freq: Once | ORAL | Status: DC | PRN

## 2024-07-11 MED ORDER — HEPARIN SODIUM (PORCINE) 1000 UNIT/ML IJ SOLN
INTRAMUSCULAR | Status: AC
  Filled 2024-07-11: qty 10

## 2024-07-11 MED ORDER — ALUM & MAG HYDROXIDE-SIMETH 200-200-20 MG/5ML PO SUSP: 15.0000 mL | ORAL | Status: DC | PRN

## 2024-07-11 MED ORDER — SODIUM CHLORIDE 0.9 % IV SOLN: INTRAVENOUS | Status: DC

## 2024-07-11 MED ORDER — PANTOPRAZOLE SODIUM 40 MG PO TBEC: 40.0000 mg | DELAYED_RELEASE_TABLET | Freq: Every day | ORAL | Status: DC

## 2024-07-11 MED ORDER — PANTOPRAZOLE SODIUM 40 MG PO TBEC
40.0000 mg | DELAYED_RELEASE_TABLET | Freq: Every day | ORAL | Status: DC
  Administered 2024-07-11 – 2024-07-20 (×10): 40 mg via ORAL
  Filled 2024-07-11 (×10): qty 1

## 2024-07-11 MED ORDER — DAPAGLIFLOZIN PROPANEDIOL 10 MG PO TABS
10.0000 mg | ORAL_TABLET | Freq: Every day | ORAL | Status: DC
  Administered 2024-07-12 – 2024-07-20 (×9): 10 mg via ORAL
  Filled 2024-07-11 (×9): qty 1

## 2024-07-11 MED ORDER — MIDAZOLAM HCL (PF) 2 MG/2ML IJ SOLN
INTRAMUSCULAR | Status: DC | PRN
  Administered 2024-07-11: 2 mg via INTRAVENOUS

## 2024-07-11 MED ORDER — GABAPENTIN 300 MG PO CAPS
300.0000 mg | ORAL_CAPSULE | Freq: Two times a day (BID) | ORAL | Status: DC
  Administered 2024-07-11 – 2024-07-20 (×18): 300 mg via ORAL
  Filled 2024-07-11 (×6): qty 1
  Filled 2024-07-11 (×2): qty 3
  Filled 2024-07-11 (×6): qty 1
  Filled 2024-07-11: qty 3
  Filled 2024-07-11 (×3): qty 1

## 2024-07-11 MED ORDER — LABETALOL HCL 5 MG/ML IV SOLN: 10.0000 mg | INTRAVENOUS | Status: DC | PRN

## 2024-07-11 MED ORDER — MIDAZOLAM HCL 5 MG/5ML IJ SOLN
INTRAMUSCULAR | Status: AC
  Filled 2024-07-11: qty 5

## 2024-07-11 MED ORDER — POTASSIUM CHLORIDE CRYS ER 20 MEQ PO TBCR
20.0000 meq | EXTENDED_RELEASE_TABLET | Freq: Once | ORAL | Status: DC
  Filled 2024-07-11: qty 2

## 2024-07-11 MED ORDER — METHYLPREDNISOLONE SODIUM SUCC 125 MG IJ SOLR: 125.0000 mg | Freq: Once | INTRAMUSCULAR | Status: DC | PRN

## 2024-07-11 NOTE — Consult Note (Signed)
 Patient admitted with critical limb ischemia  He was due to see me next week re: gross hematuria Recent CTU reviewed - appears to have a locally advanced left sided bladder cancer with left UO involvement, left hydronephrosis  Discussed with vascular- they are planning urgent limb bypass on Thu, followed by 3 mo minimum anticoag   Briefly, limb ischemia is prioritized  Would recommend left nephrostomy via IR tube prior to Thu- alleviates obstruction/temporizes situation  Bladder cancer and elective TURBT will need to be postponed  Urology will continue to follow, formal consult note this afternoon

## 2024-07-11 NOTE — Consult Note (Signed)
 "    Chief Complaint: Patient was seen in consultation today for likely malignant left ureteral obstruction with left hydronephrosis, and with consideration for left PCNT placement.  Referring Provider(s): Dr. Penne Skye, MD   Supervising Physician: Karalee Beat  Patient Status: [facility] - In-pt  Patient is Full Code  History of Present Illness: NABOR THOMANN is a 64 y.o. male  with PMHx notable for PAD with limb ischemia, HTN, HLD, CAD s/p PCI, CHF, nephrolithiasis, gout, arthritis, lifetime smoker, and others as below.  Patient is established with  vein and vascular surgery, and is followed for left leg symptoms including paresthesias and pain.  Patient electively underwent LLE angiography this morning with Dr. Bard.  This was concerning for critical limb ischemia, and patient was admitted in anticipation of repeat stent placement on 1/7.  Plan includes DAPT therapy for no less than 3 months s/p stent placement.  Patient also recently established with Urology, Dr. Skye, with concerns for gross hematuria.  He recently underwent CT hematuria on 06/28/2024, which is concerning for posterior bladder wall mass, with left UO obstruction, and left hydronephrosis.  Unfortunately, with imminent initiation of DAPT for no less than 3 months, Dr. Skye, has deferred plans for TURBT, and recommended left nephrostomy tube placement as a stabilizing measure to preserve kidney function in the interim.  Interventional Radiology was requested for left nephrostomy tube placement. Request was reviewed and approved by Dr. Karalee. Patient is scheduled for same in IR tomorrow   Patient is alert and laying in bed, calm.  He is recovering post angiography in [facility]. Patient is currently without any significant complaints, aside from chronic left leg paresthesias. Patient denies any fevers, headache, chest pain, SOB, cough, abdominal pain, nausea, vomiting or bleeding.      Past Medical History:  Diagnosis Date   Arthritis    CHF (congestive heart failure) (HCC)    Coronary artery disease    a. 12/17 PCI with DES to pLAD with resdiual disease (RX therapy)   Depression    Global hypokinesis of left ventricle    Gout    Grade II diastolic dysfunction    History of kidney stones    History of percutaneous coronary intervention 2017   Hyperlipidemia    Hypertension    Left atrial dilation    Neuromuscular disorder (HCC)    leg numb/ can walk by self   PAD (peripheral artery disease)    2 stents in left leg   Right atrial dilation    S/P drug eluting coronary stent placement 2017   STEMI (ST elevation myocardial infarction) (HCC) 2017   Substance abuse (HCC)    alcohol and marijuana    Past Surgical History:  Procedure Laterality Date   CARDIAC CATHETERIZATION N/A 06/17/2016   Procedure: Left Heart Cath and Coronary Angiography;  Surgeon: Dorn JINNY Lesches, MD;  Location: [facility];  Service: Cardiovascular;  Laterality: N/A;   CARDIAC CATHETERIZATION N/A 06/17/2016   Procedure: Coronary Stent Intervention;  Surgeon: Dorn JINNY Lesches, MD;  Location: [facility] CV [facility];  Service: Cardiovascular;  Laterality: N/A;   cardiac stents     CATARACT EXTRACTION W/PHACO Right 05/30/2024   Procedure: PHACOEMULSIFICATION, CATARACT, WITH IOL INSERTION 2.49 00:23.5;  Surgeon: Myrna Adine Anes, MD;  Location: [facility];  Service: Ophthalmology;  Laterality: Right;   CATARACT EXTRACTION W/PHACO Left 06/13/2024   Procedure: PHACOEMULSIFICATION, CATARACT, WITH IOL INSERTION 2.52 00:27.8;  Surgeon: Myrna Adine Anes, MD;  Location: [facility];  Service: Ophthalmology;  Laterality: Left;   CERVICAL SPINE SURGERY     LOWER EXTREMITY ANGIOGRAPHY Left 06/25/2020   Procedure: LOWER EXTREMITY ANGIOGRAPHY;  Surgeon: Marea Selinda RAMAN, MD;  Location: [facility] INVASIVE CV LAB;  Service: Cardiovascular;  Laterality: Left;   LOWER EXTREMITY ANGIOGRAPHY  Left 03/11/2023   Procedure: Lower Extremity Angiography;  Surgeon: Marea Selinda RAMAN, MD;  Location: [facility] INVASIVE CV LAB;  Service: Cardiovascular;  Laterality: Left;   LOWER EXTREMITY INTERVENTION Left 07/11/2024   Procedure: Lower Extremity Angiography;  Surgeon: Marea Selinda RAMAN, MD;  Location: [facility] INVASIVE CV LAB;  Service: Cardiovascular;  Laterality: Left;    Allergies: Patient has no known allergies.  Medications: Prior to Admission medications  Medication Sig Start Date End Date Taking? Authorizing Provider  allopurinol  (ZYLOPRIM ) 300 MG tablet Take 300 mg by mouth daily.   Yes [provider]  aspirin  EC 81 MG tablet Take 1 tablet (81 mg total) by mouth daily. Swallow whole. 03/13/23  Yes Alexander, Natalie, DO  atorvastatin  (LIPITOR ) 80 MG tablet Take 1 tablet (80 mg total) by mouth daily. Patient taking differently: Take 40 mg by mouth daily. 02/21/20  Yes Trudy Anthony HERO, MD  dapagliflozin  propanediol (FARXIGA ) 10 MG TABS tablet Take 10 mg by mouth daily. 03/24/24  Yes [provider]  furosemide  (LASIX ) 40 MG tablet Take 40 mg by mouth 2 (two) times daily. 06/06/24  Yes [provider]  gabapentin  (NEURONTIN ) 300 MG capsule Take 300 mg by mouth 3 (three) times daily.   Yes [provider]  metoprolol  succinate (TOPROL -XL) 25 MG 24 hr tablet Take 12.5 mg by mouth. 03/24/24  Yes [provider]  spironolactone  (ALDACTONE ) 25 MG tablet Take 25 mg by mouth 2 (two) times daily. 03/24/24  Yes [provider]  acetaminophen  (TYLENOL ) 325 MG tablet Take 2 tablets (650 mg total) by mouth every 6 (six) hours as needed for moderate pain, mild pain, fever or headache. 03/12/23   Alexander, Natalie, DO  colchicine  0.6 MG tablet Take 0.6 mg by mouth. 03/24/24   [provider]  collagenase  (SANTYL ) 250 UNIT/GM ointment Apply topically. 08/20/20   [provider]  ELIQUIS  5 MG TABS tablet Take 1 tablet by mouth 2 (two) times daily. 12/22/22    [provider]  hydrocortisone  (ANUSOL -HC) 25 MG suppository One suppository twice a day as needed for hemorrhoids.  (Generic okay to substitute) 07/26/22   Josette Ade, MD  ibuprofen (ADVIL) 200 MG tablet Take 200 mg by mouth every 6 (six) hours as needed for headache or mild pain.    [provider]  nicotine  (NICODERM CQ  - DOSED IN MG/24 HOURS) 14 mg/24hr patch One 14 mg patch chest wall daily as needed for nicotine  craving.  (Can substitute generic) 03/12/23   Alexander, Natalie, DO  nitroGLYCERIN  (NITROSTAT ) 0.4 MG SL tablet Place under the tongue. 06/20/16   [provider]  oxyCODONE  (OXY IR/ROXICODONE ) 5 MG immediate release tablet Take 1 tablet (5 mg total) by mouth every 8 (eight) hours as needed for severe pain (pain score 7-10). Patient not taking: Reported on 07/11/2024 06/22/24   Brown, Fallon E, NP  pantoprazole  (PROTONIX ) 40 MG tablet Take 1 tablet (40 mg total) by mouth daily. 07/26/22 06/22/24  Josette Ade, MD  Polyethylene Glycol 4500 POWD 1 application  by Does not apply route as needed.    [provider]  predniSONE  (DELTASONE ) 20 MG tablet Take 20 mg by mouth 2 (two) times daily. Patient not  taking: Reported on 06/22/2024 05/03/24   [provider]  sertraline (ZOLOFT) 50 MG tablet Take 50 mg by mouth daily. Patient not taking: Reported on 07/11/2024 05/26/24 05/26/25  [provider]  isosorbide  mononitrate (IMDUR ) 60 MG 24 hr tablet Take 1 tablet (60 mg total) by mouth daily. 02/04/18 05/30/19  Laurence Bridegroom, MD     Family History  Problem Relation Age of Onset   CAD Paternal Grandfather    Breast cancer Mother    Parkinson's disease Father    Alzheimer's disease Father    Hypertension Father    Diabetes Father     Social History   Socioeconomic History   Marital status: Divorced    Spouse name: Not on file   Number of children: Not on file   Years of education: Not on file   Highest education level: Not on  file  Occupational History   Not on file  Tobacco Use   Smoking status: Every Day    Current packs/day: 1.00    Types: Cigarettes   Smokeless tobacco: Never  Vaping Use   Vaping status: Never Used  Substance and Sexual Activity   Alcohol use: Not Currently   Drug use: Yes    Frequency: 1.0 times per week    Types: Marijuana    Comment: twice monthly   Sexual activity: Not Currently  Other Topics Concern   Not on file  Social History Narrative   Not on file   Social Drivers of Health   Tobacco Use: High Risk (07/11/2024)   Patient History    Smoking Tobacco Use: Every Day    Smokeless Tobacco Use: Never    Passive Exposure: Not on file  Financial Resource Strain: Low Risk  (03/24/2024)   Received from [facility]   Overall Financial Resource Strain (CARDIA)    Difficulty of Paying Living Expenses: Not hard at all  Food Insecurity: Food Insecurity Present (07/11/2024)   Epic    Worried About Programme Researcher, Broadcasting/film/video in the Last Year: Often true    Ran Out of Food in the Last Year: Never true  Transportation Needs: No Transportation Needs (07/11/2024)   Epic    Lack of Transportation (Medical): No    Lack of Transportation (Non-Medical): No  Physical Activity: Not on file  Stress: Not on file  Social Connections: Socially Isolated (07/11/2024)   Social Connection and Isolation Panel    Frequency of Communication with Friends and Family: Never    Frequency of Social Gatherings with Friends and Family: Never    Attends Religious Services: Never    Database Administrator or Organizations: No    Attends Banker Meetings: Never    Marital Status: Divorced  Depression (PHQ2-9): Not on file  Alcohol Screen: Not on file  Housing: Low Risk (07/11/2024)   Epic    Unable to Pay for Housing in the Last Year: No    Number of Times Moved in the Last Year: 1    Homeless in the Last Year: No  Utilities: At Risk (07/11/2024)   Epic    Threatened with loss of  utilities: Yes  Health Literacy: Not on file     Review of Systems: A 12 point ROS discussed and pertinent positives are indicated in the HPI above.  All other systems are negative.  Vital Signs: BP 99/63   Pulse 83   Temp 98.6 F (37 C)   Resp 16   Ht 5' 7 (  1.702 m)   Wt 170 lb (77.1 kg)   SpO2 97%   BMI 26.63 kg/m   Advance Care Plan: The advanced care place/surrogate decision maker was discussed at the time of visit and the patient did not wish to discuss or was not able to name a surrogate decision maker or provide an advance care plan.  Physical Exam Vitals reviewed.  Constitutional:      General: He is not in acute distress.    Appearance: Normal appearance.  HENT:     Mouth/Throat:     Mouth: Mucous membranes are moist.  Cardiovascular:     Rate and Rhythm: Normal rate and regular rhythm.  Pulmonary:     Effort: Pulmonary effort is normal.     Breath sounds: Normal breath sounds.  Musculoskeletal:        General: Normal range of motion.     Cervical back: Normal range of motion.     Comments: No CVA tenderness to palpation, bilaterally.  Skin:    General: Skin is warm and dry.  Neurological:     Mental Status: He is alert and oriented to person, place, and time.  Psychiatric:        Mood and Affect: Mood normal.        Behavior: Behavior normal.        Thought Content: Thought content normal.        Judgment: Judgment normal.     Imaging: PERIPHERAL VASCULAR CATHETERIZATION Result Date: 07/11/2024 See surgical note for result.  CT HEMATURIA WORKUP Result Date: 07/02/2024 CLINICAL DATA:  Gross hematuria * Tracking Code: BO * EXAM: CT ABDOMEN AND PELVIS WITHOUT AND WITH CONTRAST TECHNIQUE: Multidetector CT imaging of the abdomen and pelvis was performed following the standard protocol before and following the bolus administration of intravenous contrast. RADIATION DOSE REDUCTION: This exam was performed according to the departmental dose-optimization  program which includes automated exposure control, adjustment of the mA and/or kV according to patient size and/or use of iterative reconstruction technique. CONTRAST:  OMNIPAQUE  IOHEXOL  300 MG/ML  SOLN COMPARISON:  05/21/2024 FINDINGS: Lower chest: No acute abnormality.  Coronary artery calcifications. Hepatobiliary: No solid liver abnormality is seen. Cholecystectomy. No biliary ductal dilatation. Pancreas: Unremarkable. No pancreatic ductal dilatation or surrounding inflammatory changes. Spleen: Normal in size without significant abnormality. Adrenals/Urinary Tract: Adrenal glands are unremarkable. Mild left hydronephrosis and hydroureter. Small nonobstructive bilateral renal calculi and or renal vascular calcifications. Contrast enhancing endoluminal mass of the posterior left aspect of the bladder overlying and obstructing the ureterovesicular junction measuring 3.8 x 2.0 cm (series 8, image 74). Stomach/Bowel: Stomach is within normal limits. Appendix appears normal. Sigmoid diverticulosis. Mild, long segment wall thickening of the descending and sigmoid colon (series 8, image 68). Vascular/Lymphatic: Aortic atherosclerosis. Unchanged prominent retroperitoneal lymph nodes, index aortocaval node measuring up to 1.0 x 0.9 cm (series 8, image 37). Reproductive: No mass or other significant abnormality. Other: Small fat containing right inguinal hernia. Fat containing parastomal hernia in the left lower quadrant (series 8, image 57). No ascites. Musculoskeletal: No acute or significant osseous findings. IMPRESSION: 1. Contrast enhancing endoluminal mass of the posterior left aspect of the bladder overlying and obstructing the ureterovesicular junction measuring 3.8 x 2.0 cm. Findings are consistent with bladder malignancy. 2. Mild left hydronephrosis and hydroureter. 3. Unchanged prominent retroperitoneal lymph nodes, index aortocaval node measuring up to 1.0 x 0.9 cm. No definite evidence of lymphadenopathy  or metastatic disease in the abdomen or pelvis. Consider PET-CT for metabolic  characterization. 4. Small nonobstructive bilateral renal calculi and/or renal vascular calcifications. 5. Sigmoid diverticulosis. Mild, long segment wall thickening of the descending and sigmoid colon, suggestive of colitis, possibly reflecting chronic sequelae of prior diverticulitis. Appearance is similar to prior examination. 6. Coronary artery disease. Aortic Atherosclerosis (ICD10-I70.0). Electronically Signed   By: Marolyn JONETTA Jaksch M.D.   On: 07/02/2024 07:15   VAS US  LOWER EXTREMITY ARTERIAL DUPLEX Result Date: 06/27/2024 LOWER EXTREMITY ARTERIAL DUPLEX STUDY Patient Name:  BRODE SCULLEY  Date of Exam:   06/22/2024 Medical Rec #: [id number]          Accession #:    [id number] Date of Birth: 1961-03-14          Patient Gender: M Patient Age:   39 years Exam Location:  [facility] Procedure:      VAS US  LOWER EXTREMITY ARTERIAL DUPLEX Referring Phys: ORVIN DARING --------------------------------------------------------------------------------  Indications: Peripheral artery disease.  Vascular Interventions: 06/25/20: Left EIA & SFA/popliteal stents with TP trunk,                         peroneal & ATA angioplasties;                         03/11/23: Left SFA/popliteal thrombectomy/PTA/stent with                         TP trunk & peroneal angioplasties;SABRA Current ABI:            Right=0.93 & Left=0.60 Performing Technologist: Elsie Churn RT, RDMS, RVT  Examination Guidelines: A complete evaluation includes B-mode imaging, spectral Doppler, color Doppler, and power Doppler as needed of all accessible portions of each vessel. Bilateral testing is considered an integral part of a complete examination. Limited examinations for reoccurring indications may be performed as noted  +--------------+--------+-----+--------+-------------------+--------------+ LEFT          PSV cm/sRatioStenosisWaveform           Comments        +--------------+--------+-----+--------+-------------------+--------------+ CFA Mid       36                   hyperemic                         +--------------+--------+-----+--------+-------------------+--------------+ DFA           124                  hyperemic                         +--------------+--------+-----+--------+-------------------+--------------+ SFA Prox                   occluded                                  +--------------+--------+-----+--------+-------------------+--------------+ SFA Mid                    occluded                                  +--------------+--------+-----+--------+-------------------+--------------+ SFA Distal                 occluded                                  +--------------+--------+-----+--------+-------------------+--------------+  POP Prox                   occluded                                  +--------------+--------+-----+--------+-------------------+--------------+ POP Distal                 occluded                                  +--------------+--------+-----+--------+-------------------+--------------+ ATA Distal    17                   dampened monophasic               +--------------+--------+-----+--------+-------------------+--------------+ PTA Mid/Distal             occluded                                  +--------------+--------+-----+--------+-------------------+--------------+ PTA Distal    10                   dampened monophasicvia collateral +--------------+--------+-----+--------+-------------------+--------------+ PERO Distal   25                   dampened monophasic               +--------------+--------+-----+--------+-------------------+--------------+  Summary: Left: Occlusion of the left SFA/popliteal artery stent and mid/distal PTA.  See table(s) above for measurements and observations. Electronically signed by Selinda Gu MD on 06/27/2024 at  7:54:41 AM.    Final    VAS US  ABI WITH/WO TBI Result Date: 06/27/2024  LOWER EXTREMITY DOPPLER STUDY Patient Name:  ROWLAND ERICSSON  Date of Exam:   06/22/2024 Medical Rec #: [id number]          Accession #:    [id number] Date of Birth: December 16, 1960          Patient Gender: M Patient Age:   73 years Exam Location:  [facility] Procedure:      VAS US  ABI WITH/WO TBI Referring Phys: SELINDA DEW --------------------------------------------------------------------------------  Indications: Peripheral artery disease.  Vascular Interventions: 06/25/20: Left EIA & SFA/popliteal stents with TP trunk,                         peroneal & ATA angioplasties;                         03/11/23: Left SFA/popliteal thrombectomy/PTA/stent with                         TP trunk & peroneal angioplasties;SABRA Performing Technologist: Elsie Churn RT, RDMS, RVT  Examination Guidelines: A complete evaluation includes at minimum, Doppler waveform signals and systolic blood pressure reading at the level of bilateral brachial, anterior tibial, and posterior tibial arteries, when vessel segments are accessible. Bilateral testing is considered an integral part of a complete examination. Photoelectric Plethysmograph (PPG) waveforms and toe systolic pressure readings are included as required and additional duplex testing as needed. Limited examinations for reoccurring indications may be performed as noted.  ABI Findings: +---------+------------------+-----+--------+-------+ Right    Rt Pressure (mmHg)IndexWaveformComment +---------+------------------+-----+--------+-------+ Brachial 92                                     +---------+------------------+-----+--------+-------+  PTA      73                0.79 biphasic        +---------+------------------+-----+--------+-------+ DP       86                0.93 biphasic        +---------+------------------+-----+--------+-------+ Great Toe57                0.62  Abnormal        +---------+------------------+-----+--------+-------+ +---------+------------------+-----+-------------------+--------------+ Left     Lt Pressure (mmHg)IndexWaveform           Comment        +---------+------------------+-----+-------------------+--------------+ Brachial 89                                                       +---------+------------------+-----+-------------------+--------------+ PTA      55                0.60 dampened monophasicvia collateral +---------+------------------+-----+-------------------+--------------+ PERO     50                0.54 dampened monophasic               +---------+------------------+-----+-------------------+--------------+ DP       46                0.50 dampened monophasic               +---------+------------------+-----+-------------------+--------------+ Great Toe33                0.36 Abnormal                          +---------+------------------+-----+-------------------+--------------+ +-------+-----------+-----------+------------+------------+ ABI/TBIToday's ABIToday's TBIPrevious ABIPrevious TBI +-------+-----------+-----------+------------+------------+ Right  0.93       0.62       0.93        NA           +-------+-----------+-----------+------------+------------+ Left   0.60       0.36       0.51        NA           +-------+-----------+-----------+------------+------------+ TOES Findings: +----------+---------------+---------------+-------+ Right ToesPressure (mmHg)Waveform       Comment +----------+---------------+---------------+-------+ 1st Digit                Abnormal               +----------+---------------+---------------+-------+ 2nd Digit                mildly abnormal        +----------+---------------+---------------+-------+ 3rd Digit                mildly abnormal        +----------+---------------+---------------+-------+ 4th Digit                 mildly abnormal        +----------+---------------+---------------+-------+ 5th Digit                Normal                 +----------+---------------+---------------+-------+  +---------+---------------+-----------------+-------+ Left ToesPressure (mmHg)Waveform         Comment +---------+---------------+-----------------+-------+ 1st Digit  severely dampened        +---------+---------------+-----------------+-------+ 2nd Digit               severely dampened        +---------+---------------+-----------------+-------+ 3rd Digit               severely dampened        +---------+---------------+-----------------+-------+ 4th Digit               severely dampened        +---------+---------------+-----------------+-------+ 5th Digit               severely dampened        +---------+---------------+-----------------+-------+  Bilateral ABIs appear essentially unchanged compared to prior study on 03/10/23 done at [facility] prior to last intervention.  Summary: Right: Resting right ankle-brachial index indicates mild right lower extremity arterial disease. The right toe-brachial index is abnormal.  Left: Resting left ankle-brachial index indicates moderate left lower extremity arterial disease. The left toe-brachial index is abnormal.  *See table(s) above for measurements and observations.  Electronically signed by Selinda Gu MD on 06/27/2024 at 7:54:32 AM.    Final     Labs:  CBC: Recent Labs    05/21/24 1043 07/11/24 1744  WBC 12.2* 7.8  HGB 15.6 14.8  HCT 46.9 44.8  PLT 130* 118*    COAGS: Recent Labs    07/11/24 1155  INR 1.0    BMP: Recent Labs    05/21/24 1043 07/11/24 0734 07/11/24 1744  NA 137  --  142  K 3.7  --  3.9  CL 100  --  104  CO2 25  --  28  GLUCOSE 115*  --  101*  BUN 16 16 17   CALCIUM  9.3  --  9.8  CREATININE 1.38* 1.21 1.25*  GFRNONAA 58* >60 >60    LIVER FUNCTION TESTS: Recent Labs    05/21/24 1043  07/11/24 1744  BILITOT 0.4 0.4  AST 28 22  ALT 27 12  ALKPHOS 129* 121  PROT 7.5 7.0  ALBUMIN  4.4 4.1    TUMOR MARKERS: No results for input(s): AFPTM, CEA, CA199, CHROMGRNA in the last 8760 hours.  Assessment and Plan: Patient is recently established with Urology, Dr. Georganne, with concerns for gross hematuria.  He recently underwent CT hematuria on 06/28/2024, which is concerning for posterior bladder wall mass, with left UO obstruction, and left hydronephrosis.  Unfortunately, with imminent initiation of DAPT for no less than 3 months, Dr. Georganne, has deferred plans for TURBT and ureteral stent placement, and recommended left nephrostomy tube placement as a stabilizing measure to preserve kidney function in the interim.   Patient will tentatively present for left nephrostomy tube placement in IR tomorrow.  Patient will be made n.p.o. at midnight in anticipation of moderate sedation. As patient underwent sedation earlier today, patient's son provided consent for procedure.  Patient remains in agreement with procedure as well. All labs and medications are within acceptable parameters.  Creatinine 1.25, Hgb 10.6, platelets 118, INR 1.0. Aspirin  has not been given, and is held. No known allergies listed on file.  Risks and benefits of left PCN placement was discussed with the patient including, but not limited to, infection, bleeding, significant bleeding causing loss or decrease in renal function or damage to adjacent structures.   All questions were answered, and patient and his son are both agreeable to proceed.  Consent signed and in chart.    Thank you for allowing our service to participate in  Reyes JONELLE Bohr 's care.  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

## 2024-07-11 NOTE — Interval H&P Note (Signed)
 History and Physical Interval Note:  07/11/2024 8:09 AM  Cameron [name]  has presented today for surgery, with the diagnosis of LLE Angio   ANESTHESIA    ASO w rest pain.  The various methods of treatment have been discussed with the patient and family. After consideration of risks, benefits and other options for treatment, the patient has consented to  Procedures: Lower Extremity Angiography (Left) as a surgical intervention.  The patient's history has been reviewed, patient examined, no change in status, stable for surgery.  I have reviewed the patient's chart and labs.  Questions were answered to the patient's satisfaction.     Hajra Port

## 2024-07-11 NOTE — Consult Note (Signed)
 "   Urology Consult  I have been asked to see the patient by Dr. Marea, for evaluation and management of bladder tumor.  Chief Complaint: gross hematuria  History of Present Illness: Cameron [name] is a 64 y.o. year old male with PMH CAD, CHF, and PAD with a recent history of gross hematuria admitted today after LLE angiogram with Dr. Marea during which he was found to have limb ischemia.  He is scheduled for LLE bypass on Wednesday, and is anticipated to be on anticoagulation for at least 3 months postoperatively.   He established care with Dr. Georganne last month for evaluation of his gross hematuria.  A CT urogram shows a large left posterior bladder mass presumably involving the UO with left hydroureteronephrosis and stable but prominent retroperitoneal lymph nodes.  Patient was scheduled for outpatient cystoscopy and CT results next week.  Creatinine has remained largely at baseline, 1.21 this morning.  No UTIs.  He reports no flank pain.  He lives alone.  He ate lunch after his angiogram today.  Past Medical History:  Diagnosis Date   Arthritis    CHF (congestive heart failure) (HCC)    Coronary artery disease    a. 12/17 PCI with DES to pLAD with resdiual disease (RX therapy)   Depression    Global hypokinesis of left ventricle    Gout    Grade II diastolic dysfunction    History of kidney stones    History of percutaneous coronary intervention 2017   Hyperlipidemia    Hypertension    Left atrial dilation    Neuromuscular disorder (HCC)    leg numb/ can walk by self   PAD (peripheral artery disease)    2 stents in left leg   Right atrial dilation    S/P drug eluting coronary stent placement 2017   STEMI (ST elevation myocardial infarction) (HCC) 2017   Substance abuse (HCC)    alcohol and marijuana    Past Surgical History:  Procedure Laterality Date   CARDIAC CATHETERIZATION N/A 06/17/2016   Procedure: Left Heart Cath and Coronary Angiography;  Surgeon: Dorn JINNY Lesches, MD;  Location: [facility];  Service: Cardiovascular;  Laterality: N/A;   CARDIAC CATHETERIZATION N/A 06/17/2016   Procedure: Coronary Stent Intervention;  Surgeon: Dorn JINNY Lesches, MD;  Location: [facility];  Service: Cardiovascular;  Laterality: N/A;   cardiac stents     CATARACT EXTRACTION W/PHACO Right 05/30/2024   Procedure: PHACOEMULSIFICATION, CATARACT, WITH IOL INSERTION 2.49 00:23.5;  Surgeon: Myrna Adine Anes, MD;  [facility]: [facility];  Service: Ophthalmology;  Laterality: Right;   CATARACT EXTRACTION W/PHACO Left 06/13/2024   Procedure: PHACOEMULSIFICATION, CATARACT, WITH IOL INSERTION 2.52 00:27.8;  Surgeon: Myrna Adine Anes, MD;  Location: [facility];  Service: Ophthalmology;  Laterality: Left;   CERVICAL SPINE SURGERY     LOWER EXTREMITY ANGIOGRAPHY Left 06/25/2020   Procedure: LOWER EXTREMITY ANGIOGRAPHY;  Surgeon: Marea Selinda RAMAN, MD;  Location: [facility];  Service: Cardiovascular;  Laterality: Left;   LOWER EXTREMITY ANGIOGRAPHY Left 03/11/2023   Procedure: Lower Extremity Angiography;  Surgeon: Marea Selinda RAMAN, MD;  Location: [facility];  Service: Cardiovascular;  Laterality: Left;    Home Medications:  Active Medications[1]  Allergies: Allergies[2]  Family History  Problem Relation Age of Onset   CAD Paternal Grandfather    Breast cancer Mother    Parkinson's disease Father    Alzheimer's disease Father    Hypertension Father  Diabetes Father     Social History:  reports that he has been smoking cigarettes. He has never used smokeless tobacco. He reports that he does not currently use alcohol. He reports current drug use. Frequency: 1.00 time per week. Drug: Marijuana.  ROS: A complete review of systems was performed.  All systems are negative except for pertinent findings as noted.  Physical Exam:  Vital signs in last 24 hours: Temp:  [98 F (36.7 C)] 98 F (36.7 C) (01/05 0729) Pulse Rate:  [0-84]  79 (01/05 1200) Resp:  [8-16] 11 (01/05 1200) BP: (84-130)/(59-82) 99/68 (01/05 1200) SpO2:  [90 %-100 %] 94 % (01/05 1200) Weight:  [77.1 kg] 77.1 kg (01/05 0729) Constitutional:  Alert and oriented, no acute distress HEENT: [place] AT, moist mucus membranes Cardiovascular: No clubbing, cyanosis, or edema Respiratory: Normal respiratory effort Skin: No rashes, bruises or suspicious lesions Neurologic: Grossly intact, no focal deficits, moving all 4 extremities Psychiatric: Normal mood and affect  Laboratory Data:  Recent Labs    07/11/24 0734  BUN 16  CREATININE 1.21   Recent Labs    07/11/24 1155  INR 1.0   Urinalysis    Component Value Date/Time   COLORURINE RED (A) 05/21/2024 1130   APPEARANCEUR Clear 06/16/2024 0844   LABSPEC  05/21/2024 1130    TEST NOT REPORTED DUE TO COLOR INTERFERENCE OF URINE PIGMENT   LABSPEC 1.026 11/20/2011 1343   PHURINE  05/21/2024 1130    TEST NOT REPORTED DUE TO COLOR INTERFERENCE OF URINE PIGMENT   GLUCOSEU 1+ (A) 06/16/2024 0844   GLUCOSEU Negative 11/20/2011 1343   HGBUR (A) 05/21/2024 1130    TEST NOT REPORTED DUE TO COLOR INTERFERENCE OF URINE PIGMENT   BILIRUBINUR Negative 06/16/2024 0844   BILIRUBINUR Negative 11/20/2011 1343   KETONESUR (A) 05/21/2024 1130    TEST NOT REPORTED DUE TO COLOR INTERFERENCE OF URINE PIGMENT   KETONESUR moderate (40) (A) 05/21/2024 0928   PROTEINUR Negative 06/16/2024 0844   PROTEINUR (A) 05/21/2024 1130    TEST NOT REPORTED DUE TO COLOR INTERFERENCE OF URINE PIGMENT   UROBILINOGEN >=8.0 (A) 05/21/2024 0928   NITRITE Negative 06/16/2024 0844   NITRITE (A) 05/21/2024 1130    TEST NOT REPORTED DUE TO COLOR INTERFERENCE OF URINE PIGMENT   LEUKOCYTESUR Negative 06/16/2024 0844   LEUKOCYTESUR (A) 05/21/2024 1130    TEST NOT REPORTED DUE TO COLOR INTERFERENCE OF URINE PIGMENT   LEUKOCYTESUR Negative 11/20/2011 1343   Results for orders placed or performed in visit on 06/16/24  Microscopic Examination      Status: Abnormal   Collection Time: 06/16/24  8:44 AM   Urine  Result Value Ref Range Status   WBC, UA 6-10 (A) 0 - 5 /hpf Final   RBC, Urine 11-30 (A) 0 - 2 /hpf Final   Epithelial Cells (non renal) 0-10 0 - 10 /hpf Final   Casts Present (A) None seen /lpf Final   Cast Type Hyaline casts N/A Final   Bacteria, UA Few None seen/Few Final   Radiologic Imaging: PERIPHERAL VASCULAR CATHETERIZATION Result Date: 07/11/2024 See surgical note for result.   Assessment & Plan:  64 y.o. male with PMH CAD, CHF, and PAD currently admitted with LLE ischemia scheduled for bypass with Dr. Marea on Wednesday, also with a recent history of gross hematuria with CT findings of a large left posterior bladder wall soft tissue mass with left hydroureteronephrosis.  I had a very frank conversation with the patient at the bedside today.  I explained that his imaging findings are concerning for bladder cancer with involvement of the left UO.  We also discussed that his limb ischemia and anticipated 3+ months of anticoagulation further complicate his clinical picture.  He is at risk for urinary bleeding at baseline due to this tumor, and any additional urologic intervention will increase this risk.  Ultimately, I have recommended left nephrostomy tube placement in advance of LLE bypass later this week to protect his renal function and relieve his hydroureteronephrosis in advance of his postop anticoagulation course.  We discussed that we will have to defer further urologic intervention until he is safely able to stop blood thinners.  The nephrostomy tube is a temporizing measure to allow his team to prioritize attempts at limb salvage.  He expressed understanding.  He did have concerns today regarding nephrostomy tube management, since he lives alone.  IR plans to communicate with his son regarding management needs.  As of this afternoon, patient is in agreement with proceeding with nephrostomy tube  tomorrow.  Recommendations: - N.p.o. at midnight, hold anticoagulation in advance of left nephrostomy tube placement with IR tomorrow - Outpatient follow-up with Dr. Georganne for surgical planning, though anticipate no TURBT for at least 3 months until he can safely stop anticoagulation - He is at risk for clinically significant hematuria with presumed bladder mass, please contact urology if he develops significant gross hematuria following LLE bypass  Thank you for involving me in this patient's care, please page with any further questions or concerns.  Kaycee Mcgaugh, PA-C 07/11/2024 1:02 PM       [1]  Current Meds  Medication Sig   allopurinol  (ZYLOPRIM ) 300 MG tablet Take 300 mg by mouth daily.   aspirin  EC 81 MG tablet Take 1 tablet (81 mg total) by mouth daily. Swallow whole.   atorvastatin  (LIPITOR ) 80 MG tablet Take 1 tablet (80 mg total) by mouth daily. (Patient taking differently: Take 40 mg by mouth daily.)   dapagliflozin  propanediol (FARXIGA ) 10 MG TABS tablet Take 10 mg by mouth daily.   furosemide  (LASIX ) 40 MG tablet Take 40 mg by mouth 2 (two) times daily.   gabapentin  (NEURONTIN ) 300 MG capsule Take 300 mg by mouth 3 (three) times daily.   metoprolol  succinate (TOPROL -XL) 25 MG 24 hr tablet Take 12.5 mg by mouth.   spironolactone  (ALDACTONE ) 25 MG tablet Take 25 mg by mouth 2 (two) times daily.  [2] No Known Allergies  "

## 2024-07-11 NOTE — Consult Note (Signed)
 " [facility] CLINIC CARDIOLOGY CONSULT NOTE       Patient ID: Cameron [name] MRN: [id number] DOB/AGE: 08/28/1960 64 y.o.  Admit date: 07/11/2024 Referring Physician Dr. Selinda Gu Primary Physician Lars Rea BROCKS, NP  Primary Cardiologist Dr. Dewane Reason for Consultation POC  HPI: Cameron [name] is a 64 y.o. male  with a past medical history of chronic systolic CHF with apical akinesis with an EF of 20%, coronary artery disease with STEMI in 2017 with PCI and stent to LAD, paroxysmal atrial fibrillation, NSVT, apical mural thrombus, hypertension, hyperlipidemia who presented on 07/11/2024 for outpatient peripheral vascular catheterization. Found to have worsening of his PAD and needs bypass.  Cardiology was consulted for pre-operative evaluation.   Patient underwent outpatient peripheral vascular catheterization which revealed worsening of his PAD. Planning for bypass on Wednesday. Labs today notable for creatinine 1.21, GFR >60. Prior EKG from 07/2022 reviewed - NSR. At the time of my evaluation he is resting comfortably in bed. Denies any chest pain, SOB, palpitations. States that he has been doing well from a cardiac perspective recently with no anginal symptoms or issues with activity aside from pain in his foot. Has been tolerating his medications well without any issues. Reports mild occasional episodes of dizziness that are very short in duration. Denies any syncope.   Review of systems complete and found to be negative unless listed above    Past Medical History:  Diagnosis Date   Arthritis    CHF (congestive heart failure) (HCC)    Coronary artery disease    a. 12/17 PCI with DES to pLAD with resdiual disease (RX therapy)   Depression    Global hypokinesis of left ventricle    Gout    Grade II diastolic dysfunction    History of kidney stones    History of percutaneous coronary intervention 2017   Hyperlipidemia    Hypertension    Left atrial dilation    Neuromuscular  disorder (HCC)    leg numb/ can walk by self   PAD (peripheral artery disease)    2 stents in left leg   Right atrial dilation    S/P drug eluting coronary stent placement 2017   STEMI (ST elevation myocardial infarction) (HCC) 2017   Substance abuse (HCC)    alcohol and marijuana    Past Surgical History:  Procedure Laterality Date   CARDIAC CATHETERIZATION N/A 06/17/2016   Procedure: Left Heart Cath and Coronary Angiography;  Surgeon: Dorn JINNY Lesches, MD;  Location: [facility] INVASIVE CV LAB;  Service: Cardiovascular;  Laterality: N/A;   CARDIAC CATHETERIZATION N/A 06/17/2016   Procedure: Coronary Stent Intervention;  Surgeon: Dorn JINNY Lesches, MD;  Location: [facility] INVASIVE CV LAB;  Service: Cardiovascular;  Laterality: N/A;   cardiac stents     CATARACT EXTRACTION W/PHACO Right 05/30/2024   Procedure: PHACOEMULSIFICATION, CATARACT, WITH IOL INSERTION 2.49 00:23.5;  Surgeon: Myrna Adine Anes, MD;  Location: [facility];  Service: Ophthalmology;  Laterality: Right;   CATARACT EXTRACTION W/PHACO Left 06/13/2024   Procedure: PHACOEMULSIFICATION, CATARACT, WITH IOL INSERTION 2.52 00:27.8;  Surgeon: Myrna Adine Anes, MD;  Location: [facility];  Service: Ophthalmology;  Laterality: Left;   CERVICAL SPINE SURGERY     LOWER EXTREMITY ANGIOGRAPHY Left 06/25/2020   Procedure: LOWER EXTREMITY ANGIOGRAPHY;  Surgeon: Gu Selinda RAMAN, MD;  Location: [facility] INVASIVE CV LAB;  Service: Cardiovascular;  Laterality: Left;   LOWER EXTREMITY ANGIOGRAPHY Left 03/11/2023   Procedure: Lower Extremity Angiography;  Surgeon: Gu Selinda RAMAN, MD;  Location: [facility] INVASIVE CV LAB;  Service: Cardiovascular;  Laterality: Left;    Medications Prior to Admission  Medication Sig Dispense Refill Last Dose/Taking   allopurinol  (ZYLOPRIM ) 300 MG tablet Take 300 mg by mouth daily.   07/11/2024 Morning   aspirin  EC 81 MG tablet Take 1 tablet (81 mg total) by mouth daily. Swallow whole. 30 tablet 12 07/10/2024   atorvastatin   (LIPITOR ) 80 MG tablet Take 1 tablet (80 mg total) by mouth daily. (Patient taking differently: Take 40 mg by mouth daily.)   07/11/2024 Morning   dapagliflozin  propanediol (FARXIGA ) 10 MG TABS tablet Take 10 mg by mouth daily.   07/11/2024 Morning   furosemide  (LASIX ) 40 MG tablet Take 40 mg by mouth 2 (two) times daily.   07/11/2024 Morning   gabapentin  (NEURONTIN ) 300 MG capsule Take 300 mg by mouth 3 (three) times daily.   07/10/2024   metoprolol  succinate (TOPROL -XL) 25 MG 24 hr tablet Take 12.5 mg by mouth.   07/11/2024 Morning   spironolactone  (ALDACTONE ) 25 MG tablet Take 25 mg by mouth 2 (two) times daily.   07/11/2024 Morning   acetaminophen  (TYLENOL ) 325 MG tablet Take 2 tablets (650 mg total) by mouth every 6 (six) hours as needed for moderate pain, mild pain, fever or headache.      colchicine  0.6 MG tablet Take 0.6 mg by mouth.      collagenase  (SANTYL ) 250 UNIT/GM ointment Apply topically.      ELIQUIS  5 MG TABS tablet Take 1 tablet by mouth 2 (two) times daily.   07/07/2024   hydrocortisone  (ANUSOL -HC) 25 MG suppository One suppository twice a day as needed for hemorrhoids.  (Generic okay to substitute) 12 suppository 0    ibuprofen (ADVIL) 200 MG tablet Take 200 mg by mouth every 6 (six) hours as needed for headache or mild pain.      nicotine  (NICODERM CQ  - DOSED IN MG/24 HOURS) 14 mg/24hr patch One 14 mg patch chest wall daily as needed for nicotine  craving.  (Can substitute generic) 28 patch 0    nitroGLYCERIN  (NITROSTAT ) 0.4 MG SL tablet Place under the tongue.      oxyCODONE  (OXY IR/ROXICODONE ) 5 MG immediate release tablet Take 1 tablet (5 mg total) by mouth every 8 (eight) hours as needed for severe pain (pain score 7-10). (Patient not taking: Reported on 07/11/2024) 20 tablet 0 Completed Course   pantoprazole  (PROTONIX ) 40 MG tablet Take 1 tablet (40 mg total) by mouth daily. 30 tablet 0    Polyethylene Glycol 4500 POWD 1 application  by Does not apply route as needed.      predniSONE   (DELTASONE ) 20 MG tablet Take 20 mg by mouth 2 (two) times daily. (Patient not taking: Reported on 06/22/2024)      sertraline (ZOLOFT) 50 MG tablet Take 50 mg by mouth daily. (Patient not taking: Reported on 07/11/2024)   Not Taking   Social History   Socioeconomic History   Marital status: Divorced    Spouse name: Not on file   Number of children: Not on file   Years of education: Not on file   Highest education level: Not on file  Occupational History   Not on file  Tobacco Use   Smoking status: Every Day    Current packs/day: 1.00    Types: Cigarettes   Smokeless tobacco: Never  Vaping Use   Vaping status: Never Used  Substance and Sexual Activity   Alcohol use: Not Currently   Drug use: Yes  Frequency: 1.0 times per week    Types: Marijuana    Comment: twice monthly   Sexual activity: Not Currently  Other Topics Concern   Not on file  Social History Narrative   Not on file   Social Drivers of Health   Tobacco Use: High Risk (07/11/2024)   Patient History    Smoking Tobacco Use: Every Day    Smokeless Tobacco Use: Never    Passive Exposure: Not on file  Financial Resource Strain: Low Risk  (03/24/2024)   Received from [facility]   Overall Financial Resource Strain (CARDIA)    Difficulty of Paying Living Expenses: Not hard at all  Food Insecurity: Food Insecurity Present (03/24/2024)   Received from [facility]   Epic    Within the past 12 months, you worried that your food would run out before you got the money to buy more.: Never true    Within the past 12 months, the food you bought just didn't last and you didn't have money to get more.: Sometimes true  Transportation Needs: No Transportation Needs (03/24/2024)   Received from [facility] - Transportation    In the past 12 months, has lack of transportation kept you from medical appointments or from getting medications?: No    Lack of Transportation  (Non-Medical): No  Physical Activity: Not on file  Stress: Not on file  Social Connections: Not on file  Intimate Partner Violence: Not At Risk (03/10/2023)   Humiliation, Afraid, Rape, and Kick questionnaire    Fear of Current or Ex-Partner: No    Emotionally Abused: No    Physically Abused: No    Sexually Abused: No  Depression (PHQ2-9): Not on file  Alcohol Screen: Not on file  Housing: Unknown (03/24/2024)   Received from [facility]   Epic    In the last 12 months, was there a time when you were not able to pay the mortgage or rent on time?: No    Number of Times Moved in the Last Year: Not on file    At any time in the past 12 months, were you homeless or living in a shelter (including now)?: No  Utilities: Not At Risk (03/24/2024)   Received from [facility]   Epic    In the past 12 months has the electric, gas, oil, or water company threatened to shut off services in your home?: No  Health Literacy: Not on file    Family History  Problem Relation Age of Onset   CAD Paternal Grandfather    Breast cancer Mother    Parkinson's disease Father    Alzheimer's disease Father    Hypertension Father    Diabetes Father      Vitals:   07/11/24 0945 07/11/24 1000 07/11/24 1015 07/11/24 1030  BP: (!) 84/64 (!) 99/59 91/66 117/71  Pulse: 77 75 74 74  Resp: (!) 8 14 14 11   Temp:      TempSrc:      SpO2: 94% 91% 91% 97%  Weight:      Height:        PHYSICAL EXAM General: Well appearing male, well nourished, in no acute distress. HEENT: Normocephalic and atraumatic. Neck: No JVD.  Lungs: Normal respiratory effort on room air. Clear bilaterally to auscultation. No wheezes, crackles, rhonchi.  Heart: HRRR. Normal S1 and S2 without gallops or murmurs.  Abdomen: Non-distended appearing.  Msk: Normal strength  and tone for age. Extremities: Warm and well perfused. No clubbing, cyanosis. No edema.  Neuro: Alert and oriented X 3. Psych: Answers  questions appropriately.   Labs: Basic Metabolic Panel: Recent Labs    07/11/24 0734  BUN 16  CREATININE 1.21   Liver Function Tests: No results for input(s): AST, ALT, ALKPHOS, BILITOT, PROT, ALBUMIN  in the last 72 hours. No results for input(s): LIPASE, AMYLASE in the last 72 hours. CBC: No results for input(s): WBC, NEUTROABS, HGB, HCT, MCV, PLT in the last 72 hours. Cardiac Enzymes: No results for input(s): CKTOTAL, CKMB, CKMBINDEX, TROPONINIHS in the last 72 hours. BNP: No results for input(s): BNP in the last 72 hours. D-Dimer: No results for input(s): DDIMER in the last 72 hours. Hemoglobin A1C: No results for input(s): HGBA1C in the last 72 hours. Fasting Lipid Panel: No results for input(s): CHOL, HDL, LDLCALC, TRIG, CHOLHDL, LDLDIRECT in the last 72 hours. Thyroid  Function Tests: No results for input(s): TSH, T4TOTAL, T3FREE, THYROIDAB in the last 72 hours.  Invalid input(s): FREET3 Anemia Panel: No results for input(s): VITAMINB12, FOLATE, FERRITIN, TIBC, IRON, RETICCTPCT in the last 72 hours.   Radiology: PERIPHERAL VASCULAR CATHETERIZATION Result Date: 07/11/2024 See surgical note for result.  CT HEMATURIA WORKUP Result Date: 07/02/2024 CLINICAL DATA:  Gross hematuria * Tracking Code: BO * EXAM: CT ABDOMEN AND PELVIS WITHOUT AND WITH CONTRAST TECHNIQUE: Multidetector CT imaging of the abdomen and pelvis was performed following the standard protocol before and following the bolus administration of intravenous contrast. RADIATION DOSE REDUCTION: This exam was performed according to the departmental dose-optimization program which includes automated exposure control, adjustment of the mA and/or kV according to patient size and/or use of iterative reconstruction technique. CONTRAST:  OMNIPAQUE  IOHEXOL  300 MG/ML  SOLN COMPARISON:  05/21/2024 FINDINGS: Lower chest: No acute abnormality.   Coronary artery calcifications. Hepatobiliary: No solid liver abnormality is seen. Cholecystectomy. No biliary ductal dilatation. Pancreas: Unremarkable. No pancreatic ductal dilatation or surrounding inflammatory changes. Spleen: Normal in size without significant abnormality. Adrenals/Urinary Tract: Adrenal glands are unremarkable. Mild left hydronephrosis and hydroureter. Small nonobstructive bilateral renal calculi and or renal vascular calcifications. Contrast enhancing endoluminal mass of the posterior left aspect of the bladder overlying and obstructing the ureterovesicular junction measuring 3.8 x 2.0 cm (series 8, image 74). Stomach/Bowel: Stomach is within normal limits. Appendix appears normal. Sigmoid diverticulosis. Mild, long segment wall thickening of the descending and sigmoid colon (series 8, image 68). Vascular/Lymphatic: Aortic atherosclerosis. Unchanged prominent retroperitoneal lymph nodes, index aortocaval node measuring up to 1.0 x 0.9 cm (series 8, image 37). Reproductive: No mass or other significant abnormality. Other: Small fat containing right inguinal hernia. Fat containing parastomal hernia in the left lower quadrant (series 8, image 57). No ascites. Musculoskeletal: No acute or significant osseous findings. IMPRESSION: 1. Contrast enhancing endoluminal mass of the posterior left aspect of the bladder overlying and obstructing the ureterovesicular junction measuring 3.8 x 2.0 cm. Findings are consistent with bladder malignancy. 2. Mild left hydronephrosis and hydroureter. 3. Unchanged prominent retroperitoneal lymph nodes, index aortocaval node measuring up to 1.0 x 0.9 cm. No definite evidence of lymphadenopathy or metastatic disease in the abdomen or pelvis. Consider PET-CT for metabolic characterization. 4. Small nonobstructive bilateral renal calculi and/or renal vascular calcifications. 5. Sigmoid diverticulosis. Mild, long segment wall thickening of the descending and sigmoid  colon, suggestive of colitis, possibly reflecting chronic sequelae of prior diverticulitis. Appearance is similar to prior examination. 6. Coronary artery disease. Aortic Atherosclerosis (ICD10-I70.0). Electronically Signed  By: Marolyn JONETTA Jaksch M.D.   On: 07/02/2024 07:15   VAS US  LOWER EXTREMITY ARTERIAL DUPLEX Result Date: 06/27/2024 LOWER EXTREMITY ARTERIAL DUPLEX STUDY Patient Name:  Cameron [name]  Date of Exam:   06/22/2024 Medical Rec #: [id number]          Accession #:    [id number] Date of Birth: 02-12-1961          Patient Gender: M Patient Age:   59 years [facility]am [facility] Procedure:      VAS US  LOWER EXTREMITY ARTERIAL DUPLEX Referring Phys: ORVIN DARING --------------------------------------------------------------------------------  Indications: Peripheral artery disease.  Vascular Interventions: 06/25/20: Left EIA & SFA/popliteal stents with TP trunk,                         peroneal & ATA angioplasties;                         03/11/23: Left SFA/popliteal thrombectomy/PTA/stent with                         TP trunk & peroneal angioplasties;SABRA Current ABI:            Right=0.93 & Left=0.60 Performing Technologist: Elsie Churn RT, RDMS, RVT  Examination Guidelines: A complete evaluation includes B-mode imaging, spectral Doppler, color Doppler, and power Doppler as needed of all accessible portions of each vessel. Bilateral testing is considered an integral part of a complete examination. Limited examinations for reoccurring indications may be performed as noted  +--------------+--------+-----+--------+-------------------+--------------+ LEFT          PSV cm/sRatioStenosisWaveform           Comments       +--------------+--------+-----+--------+-------------------+--------------+ CFA Mid       36                   hyperemic                         +--------------+--------+-----+--------+-------------------+--------------+ DFA           124                   hyperemic                         +--------------+--------+-----+--------+-------------------+--------------+ SFA Prox                   occluded                                  +--------------+--------+-----+--------+-------------------+--------------+ SFA Mid                    occluded                                  +--------------+--------+-----+--------+-------------------+--------------+ SFA Distal                 occluded                                  +--------------+--------+-----+--------+-------------------+--------------+ POP Prox  occluded                                  +--------------+--------+-----+--------+-------------------+--------------+ POP Distal                 occluded                                  +--------------+--------+-----+--------+-------------------+--------------+ ATA Distal    17                   dampened monophasic               +--------------+--------+-----+--------+-------------------+--------------+ PTA Mid/Distal             occluded                                  +--------------+--------+-----+--------+-------------------+--------------+ PTA Distal    10                   dampened monophasicvia collateral +--------------+--------+-----+--------+-------------------+--------------+ PERO Distal   25                   dampened monophasic               +--------------+--------+-----+--------+-------------------+--------------+  Summary: Left: Occlusion of the left SFA/popliteal artery stent and mid/distal PTA.  See table(s) above for measurements and observations. Electronically signed by Selinda Gu MD on 06/27/2024 at 7:54:41 AM.    Final    VAS US  ABI WITH/WO TBI Result Date: 06/27/2024  LOWER EXTREMITY DOPPLER STUDY Patient Name:  Cameron [name]  Date of Exam:   06/22/2024 Medical Rec #: [id number]          Accession #:    [id number] Date of Birth: 25-Jan-1961          Patient  Gender: M Patient Age:   45 years Exam Location:  [facility] Procedure:      VAS US  ABI WITH/WO TBI Referring Phys: SELINDA DEW --------------------------------------------------------------------------------  Indications: Peripheral artery disease.  Vascular Interventions: 06/25/20: Left EIA & SFA/popliteal stents with TP trunk,                         peroneal & ATA angioplasties;                         03/11/23: Left SFA/popliteal thrombectomy/PTA/stent with                         TP trunk & peroneal angioplasties;SABRA Performing Technologist: Elsie Churn RT, RDMS, RVT  Examination Guidelines: A complete evaluation includes at minimum, Doppler waveform signals and systolic blood pressure reading at the level of bilateral brachial, anterior tibial, and posterior tibial arteries, when vessel segments are accessible. Bilateral testing is considered an integral part of a complete examination. Photoelectric Plethysmograph (PPG) waveforms and toe systolic pressure readings are included as required and additional duplex testing as needed. Limited examinations for reoccurring indications may be performed as noted.  ABI Findings: +---------+------------------+-----+--------+-------+ Right    Rt Pressure (mmHg)IndexWaveformComment +---------+------------------+-----+--------+-------+ Brachial 92                                     +---------+------------------+-----+--------+-------+  PTA      73                0.79 biphasic        +---------+------------------+-----+--------+-------+ DP       86                0.93 biphasic        +---------+------------------+-----+--------+-------+ Great Toe57                0.62 Abnormal        +---------+------------------+-----+--------+-------+ +---------+------------------+-----+-------------------+--------------+ Left     Lt Pressure (mmHg)IndexWaveform           Comment         +---------+------------------+-----+-------------------+--------------+ Brachial 89                                                       +---------+------------------+-----+-------------------+--------------+ PTA      55                0.60 dampened monophasicvia collateral +---------+------------------+-----+-------------------+--------------+ PERO     50                0.54 dampened monophasic               +---------+------------------+-----+-------------------+--------------+ DP       46                0.50 dampened monophasic               +---------+------------------+-----+-------------------+--------------+ Great Toe33                0.36 Abnormal                          +---------+------------------+-----+-------------------+--------------+ +-------+-----------+-----------+------------+------------+ ABI/TBIToday's ABIToday's TBIPrevious ABIPrevious TBI +-------+-----------+-----------+------------+------------+ Right  0.93       0.62       0.93        NA           +-------+-----------+-----------+------------+------------+ Left   0.60       0.36       0.51        NA           +-------+-----------+-----------+------------+------------+ TOES Findings: +----------+---------------+---------------+-------+ Right ToesPressure (mmHg)Waveform       Comment +----------+---------------+---------------+-------+ 1st Digit                Abnormal               +----------+---------------+---------------+-------+ 2nd Digit                mildly abnormal        +----------+---------------+---------------+-------+ 3rd Digit                mildly abnormal        +----------+---------------+---------------+-------+ 4th Digit                mildly abnormal        +----------+---------------+---------------+-------+ 5th Digit                Normal                 +----------+---------------+---------------+-------+   +---------+---------------+-----------------+-------+ Left ToesPressure (mmHg)Waveform         Comment +---------+---------------+-----------------+-------+ 1st Digit  severely dampened        +---------+---------------+-----------------+-------+ 2nd Digit               severely dampened        +---------+---------------+-----------------+-------+ 3rd Digit               severely dampened        +---------+---------------+-----------------+-------+ 4th Digit               severely dampened        +---------+---------------+-----------------+-------+ 5th Digit               severely dampened        +---------+---------------+-----------------+-------+  Bilateral ABIs appear essentially unchanged compared to prior study on 03/10/23 done at [facility] prior to last intervention.  Summary: Right: Resting right ankle-brachial index indicates mild right lower extremity arterial disease. The right toe-brachial index is abnormal.  Left: Resting left ankle-brachial index indicates moderate left lower extremity arterial disease. The left toe-brachial index is abnormal.  *See table(s) above for measurements and observations.  Electronically signed by Selinda Gu MD on 06/27/2024 at 7:54:32 AM.    Final     ECHO 03/29/2024: SEVERE LEFT VENTRICULAR SYSTOLIC DYSFUNCTION WITH NO LVH  ESTIMATED EF: 20%  NORMAL LA PRESSURES WITH DIASTOLIC DYSFUNCTION (GRADE 1)  NORMAL RIGHT VENTRICULAR SYSTOLIC FUNCTION  VALVULAR REGURGITATION: No AR, MILD MR, MILD PR, MILD TR  ESTIMATED RVSP: 34 mmHg (Normal)  NO VALVULAR STENOSIS   ECHO 05/2021: SEVERE LV SYSTOLIC DYSFUNCTION (See above)  MODERATE RV SYSTOLIC DYSFUNCTION (See above)  MODERATE VALVULAR REGURGITATION (See above)  NO VALVULAR STENOSIS  ESTIMATED LVEF 20%  Aortic: NORMAL GRADIENTS  Mitral: MILD - MODERATE MR  Tricuspid: MODERATE TR (3.49m/s)  Pulmonic: MODERATE PI  MODERATE LAE  MILD RAE  MILD LVE  MODERATE RVE  MODERATELY  DILATED MAIN PULMONARY ARTERY MEASURING 2.6cm   TELEMETRY (personally reviewed): sinus rhythm rate 80s  EKG 07/17/2022 (personally reviewed): NSR rate 100 bpm  Data reviewed by me 07/11/2024: last 24h vitals tele labs imaging I/O vascular notes  Principal Problem:   Atherosclerosis of artery of extremity with rest pain [facility])    ASSESSMENT AND PLAN:  Cameron [name] is a 65 y.o. male  with a past medical history of chronic systolic CHF with apical akinesis with an EF of 20%, coronary artery disease with STEMI in 2017 with PCI and stent to LAD, paroxysmal atrial fibrillation, NSVT, apical mural thrombus, hypertension, hyperlipidemia who presented on 07/11/2024 for outpatient peripheral vascular catheterization. Found to have worsening of his PAD and needs bypass.  Cardiology was consulted for pre-operative evaluation.   # Preoperative cardiovascular evaluation # Peripheral vascular disease # Chronic HFrEF # Coronary artery disease # Hx apical mural thrombus Patient presented for outpatient peripheral vascular cath and was found to have worsening of PAD. Vascular surgery planning for bypass later this week. He has known hx of systolic heart failure which has been stable for many years. Also has hx of CAD with no symptoms of chest pain/angina now or at all recently.  -Given his cardiac history he will be at elevated but not prohibitive risk for proceeding with surgery. He is optimized from a cardiac perspective and no further workup is indicated.  -Would recommend continuing home GDMT as able perioperatively. Home farxiga  and metoprolol  have been reordered.  -Home eliquis  currently held, recommend resuming when able post-operatively. -Continue aspirin  81 mg daily and atorvastatin  40 mg daily.   This patient's plan of care was  discussed and created with Dr. Florencio and he is in agreement.  Signed: Danita Bloch, PA-C  07/11/2024, 11:11 AM [facility]      "

## 2024-07-11 NOTE — Op Note (Signed)
 [name]  Percutaneous Study/Intervention Procedural Note   Date of Surgery: 07/11/2024  Surgeon(s):Cameron [name]    Assistants:none  Pre-operative Diagnosis: PAD with rest pain LLE  Post-operative diagnosis:  Same  Procedure(s) Performed:             1.  Ultrasound guidance for vascular access right femoral artery             2.  Catheter placement into left common femoral artery from right femoral approach             3.  Aortogram and selective left lower extremity angiogram             4.  StarClose closure device right femoral artery  EBL: 5 cc  Contrast: 35 cc  Fluoro Time: 0.8 minutes  Moderate Conscious Sedation Time: approximately 28 minutes using 2 mg of Versed  and 50 mcg of Fentanyl               Indications:  Patient is a 63 y.o.male with recurrent rest pain of the left lower extremity. The patient has noninvasive study showing markedly reduced ABI with likely occlusion of previous interventions by noninvasive studies. The patient is brought in for angiography for further evaluation and potential treatment.  Due to the limb threatening nature of the situation, angiogram was performed for attempted limb salvage. The patient is aware that if the procedure fails, amputation would be expected.  The patient also understands that even with successful revascularization, amputation may still be required due to the severity of the situation.  Risks and benefits are discussed and informed consent is obtained.   Procedure:  The patient was identified and appropriate procedural time out was performed.  The patient was then placed supine on the table and prepped and draped in the usual sterile fashion. Moderate conscious sedation was administered during a face to face encounter with the patient throughout the procedure with my supervision of the RN administering medicines and monitoring the patient's vital signs, pulse oximetry, telemetry and mental status throughout  from the start of the procedure until the patient was taken to the recovery room. Ultrasound was used to evaluate the right common femoral artery.  It was patent .  A digital ultrasound image was acquired.  A Seldinger needle was used to access the right common femoral artery under direct ultrasound guidance and a permanent image was performed.  A 0.035 J wire was advanced without resistance and a 5Fr sheath was placed.  Pigtail catheter was placed into the aorta and an AP aortogram was performed. This demonstrated normal renal arteries and normal aorta and iliac segments without significant stenosis after previous left iliac stenting. I then crossed the aortic bifurcation and advanced to the left femoral head. Selective left lower extremity angiogram was then performed. This demonstrated heavy disease of the left common femoral artery creating a greater than 60% calcific stenosis.  The previously placed SFA stents were occluded.  The proximal profunda femoris artery was occluded with the SFA stents likely at the origin of the profunda femoris artery creating hyperplasia and causing occlusion centimeter to this artery.  The SFA and popliteal stents were all thrombosed and occluded.  The only runoff distally was the peroneal artery and the tibioperoneal trunk appeared heavily diseased and calcific at the level of reconstitution and creating significant stenosis in this location of greater than 70%. It was felt that the patient would clearly need operative therapy to address these issues.  The best  option would likely be a femoral to distal bypass as well as a separate distinct profunda femoris endarterectomy to address this separate lesion.  I did do a quick ultrasound on his saphenous vein which appeared small and likely not going to be usable for bypass unfortunately so consideration for some sort of hybrid procedure will also be given.  I elected to terminate the procedure. The sheath was removed and StarClose  closure device was deployed in the right femoral artery with excellent hemostatic result. The patient was taken to the recovery room in stable condition having tolerated the procedure well.  Findings:               Aortogram:  This demonstrated normal renal arteries and normal aorta and iliac segments without significant stenosis after previous left iliac stenting.             Left Lower Extremity:  This demonstrated heavy disease of the left common femoral artery creating a greater than 60% calcific stenosis.  The previously placed SFA stents were occluded.  The proximal profunda femoris artery was occluded with the SFA stents likely at the origin of the profunda femoris artery creating hyperplasia and causing occlusion centimeter to this artery.  The SFA and popliteal stents were all thrombosed and occluded.  The only runoff distally was the peroneal artery and the tibioperoneal trunk appeared heavily diseased and calcific at the level of reconstitution and creating significant stenosis in this location of greater than 70%   Disposition: Patient was taken to the recovery room in stable condition having tolerated the procedure well.  Complications: None  Cameron [name] 07/11/2024 9:17 AM   This note was created with Dragon Medical transcription system. Any errors in dictation are purely unintentional.

## 2024-07-12 ENCOUNTER — Inpatient Hospital Stay: Admitting: Radiology

## 2024-07-12 DIAGNOSIS — N3289 Other specified disorders of bladder: Secondary | ICD-10-CM | POA: Diagnosis not present

## 2024-07-12 DIAGNOSIS — I70222 Atherosclerosis of native arteries of extremities with rest pain, left leg: Secondary | ICD-10-CM | POA: Diagnosis not present

## 2024-07-12 DIAGNOSIS — N1330 Unspecified hydronephrosis: Secondary | ICD-10-CM | POA: Diagnosis not present

## 2024-07-12 HISTORY — PX: IR NEPHROSTOMY PLACEMENT LEFT: IMG6063

## 2024-07-12 LAB — CBC
HCT: 41.9 % (ref 39.0–52.0)
Hemoglobin: 13.7 g/dL (ref 13.0–17.0)
MCH: 30.5 pg (ref 26.0–34.0)
MCHC: 32.7 g/dL (ref 30.0–36.0)
MCV: 93.3 fL (ref 80.0–100.0)
Platelets: 119 K/uL — ABNORMAL LOW (ref 150–400)
RBC: 4.49 MIL/uL (ref 4.22–5.81)
RDW: 15.9 % — ABNORMAL HIGH (ref 11.5–15.5)
WBC: 6.9 K/uL (ref 4.0–10.5)
nRBC: 0 % (ref 0.0–0.2)

## 2024-07-12 LAB — BASIC METABOLIC PANEL WITH GFR
Anion gap: 10 (ref 5–15)
BUN: 17 mg/dL (ref 8–23)
CO2: 24 mmol/L (ref 22–32)
Calcium: 9.2 mg/dL (ref 8.9–10.3)
Chloride: 105 mmol/L (ref 98–111)
Creatinine, Ser: 1.17 mg/dL (ref 0.61–1.24)
GFR, Estimated: >60 mL/min
Glucose, Bld: 94 mg/dL (ref 70–99)
Potassium: 4.5 mmol/L (ref 3.5–5.1)
Sodium: 140 mmol/L (ref 135–145)

## 2024-07-12 LAB — ABO/RH: ABO/RH(D): O POS

## 2024-07-12 MED ORDER — LIDOCAINE HCL 1 % IJ SOLN
10.0000 mL | Freq: Once | INTRAMUSCULAR | Status: AC
  Administered 2024-07-12: 20 mL via INTRADERMAL
  Filled 2024-07-12: qty 10

## 2024-07-12 MED ORDER — FENTANYL CITRATE (PF) 100 MCG/2ML IJ SOLN
INTRAMUSCULAR | Status: AC | PRN
  Administered 2024-07-12 (×2): 25 ug via INTRAVENOUS
  Administered 2024-07-12: 50 ug via INTRAVENOUS

## 2024-07-12 MED ORDER — SODIUM CHLORIDE 0.9 % IV SOLN
2.0000 g | INTRAVENOUS | Status: DC
  Filled 2024-07-12 (×2): qty 20

## 2024-07-12 MED ORDER — SODIUM CHLORIDE 0.9 % IV SOLN
INTRAVENOUS | Status: AC | PRN
  Administered 2024-07-12: 2 g via INTRAVENOUS

## 2024-07-12 MED ORDER — LIDOCAINE HCL 1 % IJ SOLN
INTRAMUSCULAR | Status: AC
  Filled 2024-07-12: qty 20

## 2024-07-12 MED ORDER — FENTANYL CITRATE (PF) 100 MCG/2ML IJ SOLN
INTRAMUSCULAR | Status: AC
  Filled 2024-07-12: qty 2

## 2024-07-12 MED ORDER — IOHEXOL 300 MG/ML  SOLN
15.0000 mL | Freq: Once | INTRAMUSCULAR | Status: AC | PRN
  Administered 2024-07-12: 15 mL

## 2024-07-12 MED ORDER — MIDAZOLAM HCL (PF) 2 MG/2ML IJ SOLN
INTRAMUSCULAR | Status: AC | PRN
  Administered 2024-07-12: .5 mg via INTRAVENOUS
  Administered 2024-07-12: 1 mg via INTRAVENOUS
  Administered 2024-07-12: .5 mg via INTRAVENOUS

## 2024-07-12 MED ORDER — MIDAZOLAM HCL 2 MG/2ML IJ SOLN
INTRAMUSCULAR | Status: AC
  Filled 2024-07-12: qty 2

## 2024-07-12 NOTE — TOC CM/SW Note (Signed)
 Transition of Care [facility]) - Inpatient Brief Assessment   Patient Details  Name: Cameron [name] MRN: [id number] Date of Birth: 1961/04/20  Transition of Care [facility]) CM/SW Contact:    Corean ONEIDA Haddock, RN Phone Number: 07/12/2024, 10:41 AM   Clinical Narrative:  Transition of Care Department [facility]) has reviewed patient and no TOC needs have been identified at this time.  If new patient transition needs arise, please place a TOC consult.  Per SDOH Food and Utility resources added to AVS   Transition of Care Asessment: Insurance and Status: Insurance coverage has been reviewed Patient has primary care physician: Yes     Prior/Current Home Services: No current home services Social Drivers of Health Review: SDOH reviewed interventions complete Readmission risk has been reviewed: Yes Transition of care needs: no transition of care needs at this time

## 2024-07-12 NOTE — Progress Notes (Signed)
 " Progress Note    07/12/2024 5:18 PM 1 Day Post-Op  Subjective:  Cameron [name] is a 64 yo male who is now POD #1 from:  Date of Surgery: 07/11/2024   Surgeon(s):DEW,JASON     Assistants:none   Pre-operative Diagnosis: PAD with rest pain LLE   Post-operative diagnosis:  Same   Procedure(s) Performed:             1.  Ultrasound guidance for vascular access right femoral artery             2.  Catheter placement into left common femoral artery from right femoral approach             3.  Aortogram and selective left lower extremity angiogram             4.  StarClose closure device right femoral artery   EBL: 5 cc   Contrast: 35 cc   Fluoro Time: 0.8 minutes   Moderate Conscious Sedation Time: approximately 28 minutes using 2 mg of Versed  and 50 mcg of Fentanyl   Patient resting comfortably this morning.  No complaints overnight.  Vitals are remained stable.  Continues to endorse rest pain of the left lower extremity as expected.  Surgery scheduled for tomorrow.   Vitals:   07/12/24 1040 07/12/24 1101  BP: 115/77 (!) 134/90  Pulse: 63 80  Resp: 19 18  Temp:  97.8 F (36.6 C)  SpO2: 93% 96%   Physical Exam: Cardiac:  RRR, normal S1 and S2.  No murmurs appreciated. Lungs: Lungs clear on auscultation throughout.  No rales rhonchi or wheezing.  Nonlabored breathing. Incisions: Right groin puncture site from angiogram.  Dressing clean dry and intact no hematoma seroma or infection to note. Extremities: All extremities are warm to touch with palpable pulses except left lower extremity cooler than his right.  Unable to palpate DP and PT pulse but does have a palpable popliteal and femoral pulse.  No open sores to bilateral lower extremities.  No gangrene to bilateral lower extremities. Abdomen: Positive bowel sounds throughout, soft, nontender nondistended. Neurologic: Alert and oriented x 3, answers all questions and follows commands.  CBC    Component Value Date/Time    WBC 6.9 07/12/2024 0509   RBC 4.49 07/12/2024 0509   HGB 13.7 07/12/2024 0509   HGB 10.6 (L) 03/26/2014 1232   HCT 41.9 07/12/2024 0509   HCT 32.1 (L) 03/26/2014 1232   PLT 119 (L) 07/12/2024 0509   PLT 137 (L) 03/26/2014 1232   MCV 93.3 07/12/2024 0509   MCV 106 (H) 03/26/2014 1232   MCH 30.5 07/12/2024 0509   MCHC 32.7 07/12/2024 0509   RDW 15.9 (H) 07/12/2024 0509   RDW 15.1 (H) 03/26/2014 1232   LYMPHSABS 0.9 08/17/2020 1837   LYMPHSABS 0.4 (L) 03/26/2014 1232   MONOABS 0.5 08/17/2020 1837   MONOABS 0.3 03/26/2014 1232   EOSABS 0.1 08/17/2020 1837   EOSABS 0.0 03/26/2014 1232   BASOSABS 0.1 08/17/2020 1837   BASOSABS 0.0 03/26/2014 1232    BMET    Component Value Date/Time   NA 140 07/12/2024 0509   NA 139 03/28/2014 0823   K 4.5 07/12/2024 0509   K 3.4 (L) 03/28/2014 0823   CL 105 07/12/2024 0509   CL 111 (H) 03/28/2014 0823   CO2 24 07/12/2024 0509   CO2 22 03/28/2014 0823   GLUCOSE 94 07/12/2024 0509   GLUCOSE 106 (H) 03/28/2014 0823   BUN 17 07/12/2024 0509   BUN  8 03/28/2014 0823   CREATININE 1.17 07/12/2024 0509   CREATININE 0.97 03/28/2014 0823   CALCIUM  9.2 07/12/2024 0509   CALCIUM  7.8 (L) 03/28/2014 0823   GFRNONAA >60 07/12/2024 0509   GFRNONAA >60 03/28/2014 0823   GFRAA >60 02/21/2020 0630   GFRAA >60 03/28/2014 0823    INR    Component Value Date/Time   INR 1.0 07/11/2024 1155     Intake/Output Summary (Last 24 hours) at 07/12/2024 1718 Last data filed at 07/12/2024 1355 Gross per 24 hour  Intake 240 ml  Output 75 ml  Net 165 ml     Assessment/Plan:  64 y.o. male is s/p see above 1 Day Post-Op   Plan Vascular surgery plans on taking the patient to the operating room tomorrow for left lower extremity femoral endarterectomy as well as left lower extremity femoral bypass grafting to distal artery.  I discussed again with the patient in detail the procedure, benefits, risk, complications.  Patient verbalized understanding wishes to  proceed.  Patient be made n.p.o. after midnight tonight for procedure tomorrow.  I answered all the patient's questions.  Patient was typed and screened for 2 units of blood if needed for the procedure.  Cell Saver was ordered for the operation as well.  Patient's last BUN and creatinine were 17 and 1.17.  Patient's last hemoglobin was 13.7 and his hematocrit was 41.9 with platelets of 119.  Patient is not on any anticoagulation at this time due to nephrostomy tube placement today by interventional radiology.  Patient does not need to be anticoagulated tonight prior procedure tomorrow per Dr. Selinda Gu MD.   DVT prophylaxis: None post nephrostomy tube placement.   Gwendlyn JONELLE [facility] 07/12/2024 5:18 PM   "

## 2024-07-12 NOTE — Progress Notes (Signed)
 Urology Inpatient Progress Note  Subjective: No acute events overnight. He is afebrile, VSS. Left nephrostomy tube placed by IR this morning.  It is draining pink urine as of midday. He reports some appropriate complicated emotions about his current medical situation but ultimately relies on his faith to get through this.  He has some general questions about nephrostomy tube management and understands that the plan for his urologic care is still developing.  Anti-infectives: Anti-infectives (From admission, onward)    Start     Dose/Rate Route Frequency Ordered Stop   07/12/24 0930  cefTRIAXone  (ROCEPHIN ) 2 g in sodium chloride  0.9 % 100 mL IVPB        over 30 Minutes  Continuous PRN 07/12/24 0930 07/12/24 0930   07/12/24 0912  cefTRIAXone  (ROCEPHIN ) 2 g in sodium chloride  0.9 % 100 mL IVPB        2 g 200 mL/hr over 30 Minutes Intravenous 30 min pre-op 07/12/24 0912     07/11/24 0720  ceFAZolin  (ANCEF ) IVPB 2g/100 mL premix        2 g 200 mL/hr over 30 Minutes Intravenous 30 min pre-op 07/11/24 0721 07/11/24 0855       Current Facility-Administered Medications  Medication Dose Route Frequency Provider Last Rate Last Admin   acetaminophen  (TYLENOL ) tablet 650 mg  650 mg Oral Q6H PRN Dew, Jason S, MD       alum & mag hydroxide-simeth (MAALOX/MYLANTA) 200-200-20 MG/5ML suspension 15-30 mL  15-30 mL Oral Q2H PRN Marea, Selinda RAMAN, MD       atorvastatin  (LIPITOR ) tablet 40 mg  40 mg Oral Daily Dew, Jason S, MD   40 mg at 07/12/24 1108   cefTRIAXone  (ROCEPHIN ) 2 g in sodium chloride  0.9 % 100 mL IVPB  2 g Intravenous 30 min Pre-Op Philip Cornet, MD       colchicine  tablet 0.6 mg  0.6 mg Oral Daily Dew, Jason S, MD   0.6 mg at 07/12/24 1108   dapagliflozin  propanediol (FARXIGA ) tablet 10 mg  10 mg Oral Daily Dew, Jason S, MD   10 mg at 07/12/24 1112   docusate sodium  (COLACE) capsule 100 mg  100 mg Oral BID Dew, Jason S, MD   100 mg at 07/12/24 1108   gabapentin  (NEURONTIN ) capsule 300 mg  300 mg  Oral BID Dew, Jason S, MD   300 mg at 07/12/24 1108   guaiFENesin -dextromethorphan  (ROBITUSSIN DM) 100-10 MG/5ML syrup 15 mL  15 mL Oral Q4H PRN Dew, Jason S, MD       hydrALAZINE  (APRESOLINE ) injection 5 mg  5 mg Intravenous Q20 Min PRN Dew, Jason S, MD       labetalol  (NORMODYNE ) injection 10 mg  10 mg Intravenous Q10 min PRN Dew, Selinda RAMAN, MD       magnesium  hydroxide (MILK OF MAGNESIA) suspension 30 mL  30 mL Oral Daily PRN Dew, Jason S, MD       metoprolol  succinate (TOPROL -XL) 24 hr tablet 12.5 mg  12.5 mg Oral Daily Dew, Jason S, MD   12.5 mg at 07/12/24 1108   metoprolol  tartrate (LOPRESSOR ) injection 2-5 mg  2-5 mg Intravenous Q2H PRN Dew, Jason S, MD       morphine  (PF) 2 MG/ML injection 2-5 mg  2-5 mg Intravenous Q1H PRN Dew, Jason S, MD       ondansetron  (ZOFRAN ) injection 4 mg  4 mg Intravenous Q6H PRN Dew, Jason S, MD       oxyCODONE  (Oxy IR/ROXICODONE ) immediate  release tablet 5 mg  5 mg Oral Q8H PRN Dew, Jason S, MD   5 mg at 07/12/24 1108   pantoprazole  (PROTONIX ) EC tablet 40 mg  40 mg Oral Daily Dew, Jason S, MD   40 mg at 07/12/24 1108   phenol (CHLORASEPTIC) mouth spray 1 spray  1 spray Mouth/Throat PRN Marea Selinda RAMAN, MD       potassium chloride  SA (KLOR-CON  M) CR tablet 20-40 mEq  20-40 mEq Oral Once Marea Selinda RAMAN, MD       Objective: Vital signs in last 24 hours: Temp:  [97.7 F (36.5 C)-98.6 F (37 C)] 97.7 F (36.5 C) (01/06 1108) Pulse Rate:  [60-100] 83 (01/06 1108) Resp:  [9-21] 18 (01/06 1108) BP: (88-145)/(63-92) 134/63 (01/06 1108) SpO2:  [88 %-100 %] 98 % (01/06 1108) Weight:  [77.1 kg] 77.1 kg (01/05 1744)  Intake/Output from previous day: No intake/output data recorded. Intake/Output this shift: No intake/output data recorded.  Physical Exam Vitals and nursing note reviewed.  Constitutional:      General: He is not in acute distress.    Appearance: He is not ill-appearing, toxic-appearing or diaphoretic.  HENT:     Head: Normocephalic and  atraumatic.  Pulmonary:     Effort: Pulmonary effort is normal. No respiratory distress.  Skin:    General: Skin is warm and dry.  Neurological:     Mental Status: He is alert and oriented to person, place, and time.  Psychiatric:        Mood and Affect: Mood normal.        Behavior: Behavior normal.    Lab Results:  Recent Labs    07/11/24 1744 07/12/24 0509  WBC 7.8 6.9  HGB 14.8 13.7  HCT 44.8 41.9  PLT 118* 119*   BMET Recent Labs    07/11/24 1744 07/12/24 0509  NA 142 140  K 3.9 4.5  CL 104 105  CO2 28 24  GLUCOSE 101* 94  BUN 17 17  CREATININE 1.25* 1.17  CALCIUM  9.8 9.2   PT/INR Recent Labs    07/11/24 1155  LABPROT 13.5  INR 1.0   Studies/Results: IR NEPHROSTOMY PLACEMENT LEFT Result Date: 07/12/2024 INDICATION: 64 year old with left hydronephrosis secondary to a bladder mass. Request for a percutaneous nephrostomy tube placement. EXAM: PLACEMENT OF LEFT NEPHROSTOMY TUBE WITH ULTRASOUND AND FLUOROSCOPIC GUIDANCE COMPARISON:  CT abdomen pelvis 06/28/2024 MEDICATIONS: Rocephin  2 g; The antibiotic was administered in an appropriate time frame prior to skin puncture. ANESTHESIA/SEDATION: Moderate (conscious) sedation was employed during this procedure. A total of Versed  2 mg and Fentanyl  100 mcg was administered intravenously by the radiology nurse. Total intra-service moderate Sedation Time: 23 minutes. The patient's level of consciousness and vital signs were monitored continuously by radiology nursing throughout the procedure under my direct supervision. CONTRAST:  15 mL Omnipaque  300-administered into the collecting system(s) FLUOROSCOPY: Radiation Exposure Index (as provided by the fluoroscopic device): 4 mGy Kerma COMPLICATIONS: None immediate. PROCEDURE: Informed written consent was obtained from the patient after a thorough discussion of the procedural risks, benefits and alternatives. All questions were addressed. Maximal Sterile Barrier Technique was utilized  including caps, mask, sterile gowns, sterile gloves, sterile drape, hand hygiene and skin antiseptic. A timeout was performed prior to the initiation of the procedure. Patient was placed prone. Left flank was prepped and draped in sterile fashion. Ultrasound was used to identify the left kidney. Ultrasound image was saved for documentation. Skin was anesthetized with 1% lidocaine . Using ultrasound guidance,  a 22 gauge needle was directed into a mildly dilated lower pole calyx. Contrast injection confirmed placement in the renal collecting system. 0.018 wire was advanced into the renal collecting system. Accustick dilator set was placed. The tract was dilated over a superstiff Amplatz wire and a 10 French multipurpose drain was reconstituted in the renal pelvis. Contrast injection confirmed placement in the renal pelvis. Drain was sutured to skin and attached to a gravity bag. Fluoroscopic and ultrasound images were taken and saved for documentation. FINDINGS: Mild to moderate left hydronephrosis. Nephrostomy tube was placed via a lower pole calyx. Nephrostomy tube was reconstituted in the renal pelvis. IMPRESSION: Successful placement of a left percutaneous nephrostomy tube using ultrasound and fluoroscopic guidance. Electronically Signed   By: Juliene Balder M.D.   On: 07/12/2024 12:39   PERIPHERAL VASCULAR CATHETERIZATION Result Date: 07/11/2024 See surgical note for result.  Assessment & Plan: 64 y.o. male with PMH CAD, CHF, and PAD currently admitted with LLE ischemia scheduled for bypass with Dr. Marea tomorrow, also with a recent history of gross hematuria with CT findings of a large left posterior bladder wall soft tissue mass with left hydroureteronephrosis.  Left nephrostomy tube is in place and draining pink urine.  We discussed that this is normal immediately following tube placement.  We reviewed his current urologic status.  We again discussed that his bladder mass is concerning for bladder cancer,  however we are unable to determine how advanced this is until he undergoes TURBT with surgical pathology.  I put nephrostomy tube management instructions in his discharge summary. We will keep plans for outpatient follow up with Dr. Georganne next week, however will defer cystoscopy for now.  Recommendations: -Anticoagulation per primary, vascular teams -Continue left nephrostomy tube -Outpatient follow up with Dr. Georganne on 1/12  Lucie Hones, PA-C 07/12/2024

## 2024-07-12 NOTE — Procedures (Signed)
 Interventional Radiology Procedure:   Indications: Left hydronephrosis and bladder mass  Procedure: Placement of left nephrostomy tube  Findings: Mild to moderate left hydronephrosis.  10 Fr nephrostomy tube placed in lower pole calyx.   Complications: None     EBL: Minimal  Plan: Left nephrostomy tube attached to a gravity bag.   Trellis Guirguis R. Philip, MD  Pager: [phone]

## 2024-07-12 NOTE — Progress Notes (Signed)
 " [facility] CLINIC CARDIOLOGY PROGRESS NOTE       Patient ID: Cameron [name] MRN: [id number] DOB/AGE: May 27, 1961 64 y.o.  Admit date: 07/11/2024 Referring Physician Dr. Selinda Gu Primary Physician Lars Rea BROCKS, NP  Primary Cardiologist Dr. Dewane Reason for Consultation POC  HPI: [name] is a 64 y.o. male  with a past medical history of chronic systolic CHF with apical akinesis with an EF of 20%, coronary artery disease with STEMI in 2017 with PCI and stent to LAD, paroxysmal atrial fibrillation, NSVT, apical mural thrombus, hypertension, hyperlipidemia who presented on 07/11/2024 for outpatient peripheral vascular catheterization. Found to have worsening of his PAD and needs bypass.  Cardiology was consulted for pre-operative evaluation.   Interval history: -Patient seen and examined this AM, resting comfortably in hospital bed.  -States he feels well overall today with no complaints of CP or SOB.  -BP and HR remain stable, no events noted on tele. Has had some low BP readings without any dizziness, some home meds have been held.  Review of systems complete and found to be negative unless listed above    Past Medical History:  Diagnosis Date   Arthritis    CHF (congestive heart failure) (HCC)    Coronary artery disease    a. 12/17 PCI with DES to pLAD with resdiual disease (RX therapy)   Depression    Global hypokinesis of left ventricle    Gout    Grade II diastolic dysfunction    History of kidney stones    History of percutaneous coronary intervention 2017   Hyperlipidemia    Hypertension    Left atrial dilation    Neuromuscular disorder (HCC)    leg numb/ can walk by self   PAD (peripheral artery disease)    2 stents in left leg   Right atrial dilation    S/P drug eluting coronary stent placement 2017   STEMI (ST elevation myocardial infarction) (HCC) 2017   Substance abuse (HCC)    alcohol and marijuana    Past Surgical History:  Procedure Laterality  Date   CARDIAC CATHETERIZATION N/A 06/17/2016   Procedure: Left Heart Cath and Coronary Angiography;  Surgeon: Dorn JINNY Lesches, MD;  Location: [facility];  Service: Cardiovascular;  Laterality: N/A;   CARDIAC CATHETERIZATION N/A 06/17/2016   Procedure: Coronary Stent Intervention;  Surgeon: Dorn JINNY Lesches, MD;  Location: [facility] CV [facility];  Service: Cardiovascular;  Laterality: N/A;   cardiac stents     CATARACT EXTRACTION W/PHACO Right 05/30/2024   Procedure: PHACOEMULSIFICATION, CATARACT, WITH IOL INSERTION 2.49 00:23.5;  Surgeon: Myrna Adine Anes, MD;  Location: [facility];  Service: Ophthalmology;  Laterality: Right;   CATARACT EXTRACTION W/PHACO Left 06/13/2024   Procedure: PHACOEMULSIFICATION, CATARACT, WITH IOL INSERTION 2.52 00:27.8;  Surgeon: Myrna Adine Anes, MD;  Location: [facility];  Service: Ophthalmology;  Laterality: Left;   CERVICAL SPINE SURGERY     LOWER EXTREMITY ANGIOGRAPHY Left 06/25/2020   Procedure: LOWER EXTREMITY ANGIOGRAPHY;  Surgeon: Gu Selinda RAMAN, MD;  Location: [facility] INVASIVE CV LAB;  Service: Cardiovascular;  Laterality: Left;   LOWER EXTREMITY ANGIOGRAPHY Left 03/11/2023   Procedure: Lower Extremity Angiography;  Surgeon: Gu Selinda RAMAN, MD;  Location: [facility] INVASIVE CV LAB;  Service: Cardiovascular;  Laterality: Left;   LOWER EXTREMITY INTERVENTION Left 07/11/2024   Procedure: Lower Extremity Angiography;  Surgeon: Gu Selinda RAMAN, MD;  Location: [facility] INVASIVE CV LAB;  Service: Cardiovascular;  Laterality: Left;    Medications Prior  to Admission  Medication Sig Dispense Refill Last Dose/Taking   allopurinol  (ZYLOPRIM ) 300 MG tablet Take 300 mg by mouth daily.   07/11/2024 Morning   aspirin  EC 81 MG tablet Take 1 tablet (81 mg total) by mouth daily. Swallow whole. 30 tablet 12 07/10/2024   atorvastatin  (LIPITOR ) 80 MG tablet Take 1 tablet (80 mg total) by mouth daily. (Patient taking differently: Take 40 mg by mouth daily.)   07/11/2024 Morning    dapagliflozin  propanediol (FARXIGA ) 10 MG TABS tablet Take 10 mg by mouth daily.   07/11/2024 Morning   furosemide  (LASIX ) 40 MG tablet Take 40 mg by mouth 2 (two) times daily.   07/11/2024 Morning   gabapentin  (NEURONTIN ) 300 MG capsule Take 300 mg by mouth 3 (three) times daily.   07/10/2024   metoprolol  succinate (TOPROL -XL) 25 MG 24 hr tablet Take 12.5 mg by mouth.   07/11/2024 Morning   spironolactone  (ALDACTONE ) 25 MG tablet Take 25 mg by mouth 2 (two) times daily.   07/11/2024 Morning   acetaminophen  (TYLENOL ) 325 MG tablet Take 2 tablets (650 mg total) by mouth every 6 (six) hours as needed for moderate pain, mild pain, fever or headache.      colchicine  0.6 MG tablet Take 0.6 mg by mouth.      collagenase  (SANTYL ) 250 UNIT/GM ointment Apply topically.      ELIQUIS  5 MG TABS tablet Take 1 tablet by mouth 2 (two) times daily.   07/07/2024   hydrocortisone  (ANUSOL -HC) 25 MG suppository One suppository twice a day as needed for hemorrhoids.  (Generic okay to substitute) 12 suppository 0    ibuprofen (ADVIL) 200 MG tablet Take 200 mg by mouth every 6 (six) hours as needed for headache or mild pain.      nicotine  (NICODERM CQ  - DOSED IN MG/24 HOURS) 14 mg/24hr patch One 14 mg patch chest wall daily as needed for nicotine  craving.  (Can substitute generic) 28 patch 0    nitroGLYCERIN  (NITROSTAT ) 0.4 MG SL tablet Place under the tongue.      oxyCODONE  (OXY IR/ROXICODONE ) 5 MG immediate release tablet Take 1 tablet (5 mg total) by mouth every 8 (eight) hours as needed for severe pain (pain score 7-10). (Patient not taking: Reported on 07/11/2024) 20 tablet 0 Completed Course   pantoprazole  (PROTONIX ) 40 MG tablet Take 1 tablet (40 mg total) by mouth daily. 30 tablet 0    Polyethylene Glycol 4500 POWD 1 application  by Does not apply route as needed.      predniSONE  (DELTASONE ) 20 MG tablet Take 20 mg by mouth 2 (two) times daily. (Patient not taking: Reported on 06/22/2024)      sertraline (ZOLOFT) 50 MG tablet  Take 50 mg by mouth daily. (Patient not taking: Reported on 07/11/2024)   Not Taking   Social History   Socioeconomic History   Marital status: Divorced    Spouse name: Not on file   Number of children: Not on file   Years of education: Not on file   Highest education level: Not on file  Occupational History   Not on file  Tobacco Use   Smoking status: Every Day    Current packs/day: 1.00    Types: Cigarettes   Smokeless tobacco: Never  Vaping Use   Vaping status: Never Used  Substance and Sexual Activity   Alcohol use: Not Currently   Drug use: Yes    Frequency: 1.0 times per week    Types: Marijuana    Comment:  twice monthly   Sexual activity: Not Currently  Other Topics Concern   Not on file  Social History Narrative   Not on file   Social Drivers of Health   Tobacco Use: High Risk (07/11/2024)   Patient History    Smoking Tobacco Use: Every Day    Smokeless Tobacco Use: Never    Passive Exposure: Not on file  Financial Resource Strain: Low Risk  (03/24/2024)   Received from [facility]   Overall Financial Resource Strain (CARDIA)    Difficulty of Paying Living Expenses: Not hard at all  Food Insecurity: Food Insecurity Present (07/11/2024)   Epic    Worried About Programme Researcher, Broadcasting/film/video in the Last Year: Often true    Ran Out of Food in the Last Year: Never true  Transportation Needs: No Transportation Needs (07/11/2024)   Epic    Lack of Transportation (Medical): No    Lack of Transportation (Non-Medical): No  Physical Activity: Not on file  Stress: Not on file  Social Connections: Socially Isolated (07/11/2024)   Social Connection and Isolation Panel    Frequency of Communication with Friends and Family: Never    Frequency of Social Gatherings with Friends and Family: Never    Attends Religious Services: Never    Database Administrator or Organizations: No    Attends Banker Meetings: Never    Marital Status: Divorced  Catering Manager  Violence: Not At Risk (07/11/2024)   Epic    Fear of Current or Ex-Partner: No    Emotionally Abused: No    Physically Abused: No    Sexually Abused: No  Depression (PHQ2-9): Not on file  Alcohol Screen: Not on file  Housing: Low Risk (07/11/2024)   Epic    Unable to Pay for Housing in the Last Year: No    Number of Times Moved in the Last Year: 1    Homeless in the Last Year: No  Utilities: At Risk (07/11/2024)   Epic    Threatened with loss of utilities: Yes  Health Literacy: Not on file    Family History  Problem Relation Age of Onset   CAD Paternal Grandfather    Breast cancer Mother    Parkinson's disease Father    Alzheimer's disease Father    Hypertension Father    Diabetes Father      Vitals:   07/11/24 1744 07/11/24 2011 07/12/24 0417 07/12/24 0854  BP: 114/84 99/63 90/73  119/89  Pulse: 74 83 80 78  Resp: 18 16 16 12   Temp: 98.2 F (36.8 C) 98.6 F (37 C) 98 F (36.7 C) 98.2 F (36.8 C)  TempSrc: Oral   Oral  SpO2: 100% 97% 98% 95%  Weight: 77.1 kg     Height: 5' 7 (1.702 m)       PHYSICAL EXAM General: Well appearing male, well nourished, in no acute distress. HEENT: Normocephalic and atraumatic. Neck: No JVD.  Lungs: Normal respiratory effort on room air. Clear bilaterally to auscultation. No wheezes, crackles, rhonchi.  Heart: HRRR. Normal S1 and S2 without gallops or murmurs.  Abdomen: Non-distended appearing.  Msk: Normal strength and tone for age. Extremities: Warm and well perfused. No clubbing, cyanosis. No edema.  Neuro: Alert and oriented X 3. Psych: Answers questions appropriately.   Labs: Basic Metabolic Panel: Recent Labs    07/11/24 1744 07/12/24 0509  NA 142 140  K 3.9 4.5  CL 104 105  CO2 28 24  GLUCOSE 101*  94  BUN 17 17  CREATININE 1.25* 1.17  CALCIUM  9.8 9.2   Liver Function Tests: Recent Labs    07/11/24 1744  AST 22  ALT 12  ALKPHOS 121  BILITOT 0.4  PROT 7.0  ALBUMIN  4.1   No results for input(s): LIPASE,  AMYLASE in the last 72 hours. CBC: Recent Labs    07/11/24 1744 07/12/24 0509  WBC 7.8 6.9  HGB 14.8 13.7  HCT 44.8 41.9  MCV 93.3 93.3  PLT 118* 119*   Cardiac Enzymes: No results for input(s): CKTOTAL, CKMB, CKMBINDEX, TROPONINIHS in the last 72 hours. BNP: No results for input(s): BNP in the last 72 hours. D-Dimer: No results for input(s): DDIMER in the last 72 hours. Hemoglobin A1C: No results for input(s): HGBA1C in the last 72 hours. Fasting Lipid Panel: No results for input(s): CHOL, HDL, LDLCALC, TRIG, CHOLHDL, LDLDIRECT in the last 72 hours. Thyroid  Function Tests: No results for input(s): TSH, T4TOTAL, T3FREE, THYROIDAB in the last 72 hours.  Invalid input(s): FREET3 Anemia Panel: No results for input(s): VITAMINB12, FOLATE, FERRITIN, TIBC, IRON, RETICCTPCT in the last 72 hours.   Radiology: PERIPHERAL VASCULAR CATHETERIZATION Result Date: 07/11/2024 See surgical note for result.  CT HEMATURIA WORKUP Result Date: 07/02/2024 CLINICAL DATA:  Gross hematuria * Tracking Code: BO * EXAM: CT ABDOMEN AND PELVIS WITHOUT AND WITH CONTRAST TECHNIQUE: Multidetector CT imaging of the abdomen and pelvis was performed following the standard protocol before and following the bolus administration of intravenous contrast. RADIATION DOSE REDUCTION: This exam was performed according to the departmental dose-optimization program which includes automated exposure control, adjustment of the mA and/or kV according to patient size and/or use of iterative reconstruction technique. CONTRAST:  OMNIPAQUE  IOHEXOL  300 MG/ML  SOLN COMPARISON:  05/21/2024 FINDINGS: Lower chest: No acute abnormality.  Coronary artery calcifications. Hepatobiliary: No solid liver abnormality is seen. Cholecystectomy. No biliary ductal dilatation. Pancreas: Unremarkable. No pancreatic ductal dilatation or surrounding inflammatory changes. Spleen: Normal in size without  significant abnormality. Adrenals/Urinary Tract: Adrenal glands are unremarkable. Mild left hydronephrosis and hydroureter. Small nonobstructive bilateral renal calculi and or renal vascular calcifications. Contrast enhancing endoluminal mass of the posterior left aspect of the bladder overlying and obstructing the ureterovesicular junction measuring 3.8 x 2.0 cm (series 8, image 74). Stomach/Bowel: Stomach is within normal limits. Appendix appears normal. Sigmoid diverticulosis. Mild, long segment wall thickening of the descending and sigmoid colon (series 8, image 68). Vascular/Lymphatic: Aortic atherosclerosis. Unchanged prominent retroperitoneal lymph nodes, index aortocaval node measuring up to 1.0 x 0.9 cm (series 8, image 37). Reproductive: No mass or other significant abnormality. Other: Small fat containing right inguinal hernia. Fat containing parastomal hernia in the left lower quadrant (series 8, image 57). No ascites. Musculoskeletal: No acute or significant osseous findings. IMPRESSION: 1. Contrast enhancing endoluminal mass of the posterior left aspect of the bladder overlying and obstructing the ureterovesicular junction measuring 3.8 x 2.0 cm. Findings are consistent with bladder malignancy. 2. Mild left hydronephrosis and hydroureter. 3. Unchanged prominent retroperitoneal lymph nodes, index aortocaval node measuring up to 1.0 x 0.9 cm. No definite evidence of lymphadenopathy or metastatic disease in the abdomen or pelvis. Consider PET-CT for metabolic characterization. 4. Small nonobstructive bilateral renal calculi and/or renal vascular calcifications. 5. Sigmoid diverticulosis. Mild, long segment wall thickening of the descending and sigmoid colon, suggestive of colitis, possibly reflecting chronic sequelae of prior diverticulitis. Appearance is similar to prior examination. 6. Coronary artery disease. Aortic Atherosclerosis (ICD10-I70.0). Electronically Signed   By: Alex D  Marlyce M.D.   On:  07/02/2024 07:15   VAS US  LOWER EXTREMITY ARTERIAL DUPLEX Result Date: 06/27/2024 LOWER EXTREMITY ARTERIAL DUPLEX STUDY Patient Name:  MAKANA ROSTAD  Date of Exam:   06/22/2024 Medical Rec #: [id number]          Accession #:    [id number] Date of Birth: September 04, 1960          Patient Gender: M Patient Age:   48 years Exam Location:  [facility] Procedure:      VAS US  LOWER EXTREMITY ARTERIAL DUPLEX Referring Phys: ORVIN DARING --------------------------------------------------------------------------------  Indications: Peripheral artery disease.  Vascular Interventions: 06/25/20: Left EIA & SFA/popliteal stents with TP trunk,                         peroneal & ATA angioplasties;                         03/11/23: Left SFA/popliteal thrombectomy/PTA/stent with                         TP trunk & peroneal angioplasties;SABRA Current ABI:            Right=0.93 & Left=0.60 Performing Technologist: Elsie Churn RT, RDMS, RVT  Examination Guidelines: A complete evaluation includes B-mode imaging, spectral Doppler, color Doppler, and power Doppler as needed of all accessible portions of each vessel. Bilateral testing is considered an integral part of a complete examination. Limited examinations for reoccurring indications may be performed as noted  +--------------+--------+-----+--------+-------------------+--------------+ LEFT          PSV cm/sRatioStenosisWaveform           Comments       +--------------+--------+-----+--------+-------------------+--------------+ CFA Mid       36                   hyperemic                         +--------------+--------+-----+--------+-------------------+--------------+ DFA           124                  hyperemic                         +--------------+--------+-----+--------+-------------------+--------------+ SFA Prox                   occluded                                   +--------------+--------+-----+--------+-------------------+--------------+ SFA Mid                    occluded                                  +--------------+--------+-----+--------+-------------------+--------------+ SFA Distal                 occluded                                  +--------------+--------+-----+--------+-------------------+--------------+ POP Prox                   occluded                                  +--------------+--------+-----+--------+-------------------+--------------+  POP Distal                 occluded                                  +--------------+--------+-----+--------+-------------------+--------------+ ATA Distal    17                   dampened monophasic               +--------------+--------+-----+--------+-------------------+--------------+ PTA Mid/Distal             occluded                                  +--------------+--------+-----+--------+-------------------+--------------+ PTA Distal    10                   dampened monophasicvia collateral +--------------+--------+-----+--------+-------------------+--------------+ PERO Distal   25                   dampened monophasic               +--------------+--------+-----+--------+-------------------+--------------+  Summary: Left: Occlusion of the left SFA/popliteal artery stent and mid/distal PTA.  See table(s) above for measurements and observations. Electronically signed by Selinda Gu MD on 06/27/2024 at 7:54:41 AM.    Final    VAS US  ABI WITH/WO TBI Result Date: 06/27/2024  LOWER EXTREMITY DOPPLER STUDY Patient Name:  DARELL SAPUTO  Date of Exam:   06/22/2024 Medical Rec #: [id number]          Accession #:    [id number] Date of Birth: 1960-09-15          Patient Gender: M Patient Age:   79 years Exam Location:  [facility] Procedure:      VAS US  ABI WITH/WO TBI Referring Phys: SELINDA DEW  --------------------------------------------------------------------------------  Indications: Peripheral artery disease.  Vascular Interventions: 06/25/20: Left EIA & SFA/popliteal stents with TP trunk,                         peroneal & ATA angioplasties;                         03/11/23: Left SFA/popliteal thrombectomy/PTA/stent with                         TP trunk & peroneal angioplasties;SABRA Performing Technologist: Elsie Churn RT, RDMS, RVT  Examination Guidelines: A complete evaluation includes at minimum, Doppler waveform signals and systolic blood pressure reading at the level of bilateral brachial, anterior tibial, and posterior tibial arteries, when vessel segments are accessible. Bilateral testing is considered an integral part of a complete examination. Photoelectric Plethysmograph (PPG) waveforms and toe systolic pressure readings are included as required and additional duplex testing as needed. Limited examinations for reoccurring indications may be performed as noted.  ABI Findings: +---------+------------------+-----+--------+-------+ Right    Rt Pressure (mmHg)IndexWaveformComment +---------+------------------+-----+--------+-------+ Brachial 92                                     +---------+------------------+-----+--------+-------+ PTA      73                0.79 biphasic        +---------+------------------+-----+--------+-------+  DP       86                0.93 biphasic        +---------+------------------+-----+--------+-------+ Great Toe57                0.62 Abnormal        +---------+------------------+-----+--------+-------+ +---------+------------------+-----+-------------------+--------------+ Left     Lt Pressure (mmHg)IndexWaveform           Comment        +---------+------------------+-----+-------------------+--------------+ Brachial 89                                                        +---------+------------------+-----+-------------------+--------------+ PTA      55                0.60 dampened monophasicvia collateral +---------+------------------+-----+-------------------+--------------+ PERO     50                0.54 dampened monophasic               +---------+------------------+-----+-------------------+--------------+ DP       46                0.50 dampened monophasic               +---------+------------------+-----+-------------------+--------------+ Great Toe33                0.36 Abnormal                          +---------+------------------+-----+-------------------+--------------+ +-------+-----------+-----------+------------+------------+ ABI/TBIToday's ABIToday's TBIPrevious ABIPrevious TBI +-------+-----------+-----------+------------+------------+ Right  0.93       0.62       0.93        NA           +-------+-----------+-----------+------------+------------+ Left   0.60       0.36       0.51        NA           +-------+-----------+-----------+------------+------------+ TOES Findings: +----------+---------------+---------------+-------+ Right ToesPressure (mmHg)Waveform       Comment +----------+---------------+---------------+-------+ 1st Digit                Abnormal               +----------+---------------+---------------+-------+ 2nd Digit                mildly abnormal        +----------+---------------+---------------+-------+ 3rd Digit                mildly abnormal        +----------+---------------+---------------+-------+ 4th Digit                mildly abnormal        +----------+---------------+---------------+-------+ 5th Digit                Normal                 +----------+---------------+---------------+-------+  +---------+---------------+-----------------+-------+ Left ToesPressure (mmHg)Waveform         Comment +---------+---------------+-----------------+-------+ 1st Digit                severely dampened        +---------+---------------+-----------------+-------+ 2nd Digit               severely  dampened        +---------+---------------+-----------------+-------+ 3rd Digit               severely dampened        +---------+---------------+-----------------+-------+ 4th Digit               severely dampened        +---------+---------------+-----------------+-------+ 5th Digit               severely dampened        +---------+---------------+-----------------+-------+  Bilateral ABIs appear essentially unchanged compared to prior study on 03/10/23 done at [facility] prior to last intervention.  Summary: Right: Resting right ankle-brachial index indicates mild right lower extremity arterial disease. The right toe-brachial index is abnormal.  Left: Resting left ankle-brachial index indicates moderate left lower extremity arterial disease. The left toe-brachial index is abnormal.  *See table(s) above for measurements and observations.  Electronically signed by Selinda Gu MD on 06/27/2024 at 7:54:32 AM.    Final     ECHO 03/29/2024: SEVERE LEFT VENTRICULAR SYSTOLIC DYSFUNCTION WITH NO LVH  ESTIMATED EF: 20%  NORMAL LA PRESSURES WITH DIASTOLIC DYSFUNCTION (GRADE 1)  NORMAL RIGHT VENTRICULAR SYSTOLIC FUNCTION  VALVULAR REGURGITATION: No AR, MILD MR, MILD PR, MILD TR  ESTIMATED RVSP: 34 mmHg (Normal)  NO VALVULAR STENOSIS   ECHO 05/2021: SEVERE LV SYSTOLIC DYSFUNCTION (See above)  MODERATE RV SYSTOLIC DYSFUNCTION (See above)  MODERATE VALVULAR REGURGITATION (See above)  NO VALVULAR STENOSIS  ESTIMATED LVEF 20%  Aortic: NORMAL GRADIENTS  Mitral: MILD - MODERATE MR  Tricuspid: MODERATE TR (3.72m/s)  Pulmonic: MODERATE PI  MODERATE LAE  MILD RAE  MILD LVE  MODERATE RVE  MODERATELY DILATED MAIN PULMONARY ARTERY MEASURING 2.6cm   TELEMETRY (personally reviewed): sinus rhythm rate 80s  EKG 07/17/2022 (personally reviewed): NSR rate 100 bpm  Data reviewed by  me 07/12/2024: last 24h vitals tele labs imaging I/O vascular notes  Principal Problem:   Atherosclerosis of artery of extremity with rest pain [facility])    ASSESSMENT AND PLAN:  Cameron [name] is a 64 y.o. male  with a past medical history of chronic systolic CHF with apical akinesis with an EF of 20%, coronary artery disease with STEMI in 2017 with PCI and stent to LAD, paroxysmal atrial fibrillation, NSVT, apical mural thrombus, hypertension, hyperlipidemia who presented on 07/11/2024 for outpatient peripheral vascular catheterization. Found to have worsening of his PAD and needs bypass.  Cardiology was consulted for pre-operative evaluation.   # Preoperative cardiovascular evaluation # Peripheral vascular disease # Chronic HFrEF # Coronary artery disease # Hx apical mural thrombus Patient presented for outpatient peripheral vascular cath and was found to have worsening of PAD. Vascular surgery planning for bypass later this week. He has known hx of systolic heart failure which has been stable for many years. Also has hx of CAD with no symptoms of chest pain/angina now or at all recently.  -Given his cardiac history he will be at elevated but not prohibitive risk for proceeding with surgery. He is optimized from a cardiac perspective and no further workup is indicated.  -Would recommend continuing home GDMT as able perioperatively. Home farxiga  and metoprolol  have been reordered.  -Home eliquis  currently held, recommend resuming when able post-operatively. -Continue aspirin  81 mg daily and atorvastatin  40 mg daily.   This patient's plan of care was discussed and created with Dr. Florencio and he is in agreement.  Signed: Danita Bloch, PA-C  07/12/2024, 8:58 AM [facility]      "

## 2024-07-12 NOTE — Anesthesia Preprocedure Evaluation (Addendum)
"                                    Anesthesia Evaluation  Patient identified by MRN, date of birth, ID band Patient awake    Reviewed: Allergy & Precautions, H&P , NPO status , Patient's Chart, lab work & pertinent test results, reviewed documented beta blocker date and time   Airway Mallampati: III  TM Distance: >3 FB Neck ROM: Full    Dental no notable dental hx. (+) Caps Multiple capped teeth:   Pulmonary Current Smoker and Patient abstained from smoking.   Pulmonary exam normal breath sounds clear to auscultation       Cardiovascular hypertension, + CAD, + Past MI (2017), + Cardiac Stents (LAD), + Peripheral Vascular Disease (PAD with limb ischemia) and +CHF (chronic systolic CHF with apical akinesis with an EF of 20%)  Normal cardiovascular exam+ dysrhythmias (pAF) + pacemaker  Rhythm:Regular Rate:Normal  Hx apical mural thrombus  CONCLUSION -------------------------------------------------------------------------------  SEVERE LEFT VENTRICULAR SYSTOLIC DYSFUNCTION WITH NO LVH  ESTIMATED EF: 20%  NORMAL LA PRESSURES WITH DIASTOLIC DYSFUNCTION (GRADE 1)  NORMAL RIGHT VENTRICULAR SYSTOLIC FUNCTION  VALVULAR REGURGITATION: No AR, MILD MR, MILD PR, MILD TR  ESTIMATED RVSP: 34 mmHg (Normal)  NO VALVULAR STENOSIS      Neuro/Psych  PSYCHIATRIC DISORDERS  Depression     Neuromuscular disease negative neurological ROS  negative psych ROS   GI/Hepatic negative GI ROS, Neg liver ROS,,,  Endo/Other  negative endocrine ROS    Renal/GU Renal diseaseS/p Left nephrostomy tube placement 07/13/23  CT hematuria on 06/28/2024, which is concerning for posterior bladder wall mass, with left UO obstruction, and left hydronephrosis.  Unfortunately, with imminent initiation of DAPT for no less than 3 months, Dr. Georganne, has deferred plans for TURBT, and recommended left nephrostomy tube placement as a stabilizing measure to preserve kidney function in the interim.  negative  genitourinary   Musculoskeletal negative musculoskeletal ROS (+) Arthritis ,    Abdominal   Peds negative pediatric ROS (+)  Hematology  (+) Blood dyscrasia thrombocytopenia   Anesthesia Other Findings Cardiology visit with clearance 07/13/2023.  CHF (congestive heart failure) (HCC)  Coronary artery disease 12/17 PCI with DES to pLAD with resdiual disease (RX  therapy) Hyperlipidemia Hypertension PAD (peripheral artery disease) (HCC) 2017: STEMI (ST elevation myocardial infarction)  Medical History  Hypertension Coronary artery disease Hyperlipidemia  STEMI (ST elevation myocardial infarction) (HCC) CHF (congestive heart failure) (HCC) PAD (peripheral artery disease) S/P drug eluting coronary stent placement History of percutaneous coronary intervention Grade II diastolic dysfunction  Global hypokinesis of left ventricle Left atrial dilation  Right atrial dilation     Reproductive/Obstetrics negative OB ROS                              Anesthesia Physical Anesthesia Plan  ASA: 4  Anesthesia Plan: General   Post-op Pain Management: Tylenol  PO (pre-op)* and Ketamine IV*   Induction: Intravenous  PONV Risk Score and Plan:   Airway Management Planned: Oral ETT  Additional Equipment: Arterial line  Intra-op Plan:   Post-operative Plan: Extubation in OR  Informed Consent:      Dental Advisory Given  Plan Discussed with: Anesthesiologist, CRNA and Surgeon  Anesthesia Plan Comments:          Anesthesia Quick Evaluation  "

## 2024-07-12 NOTE — Discharge Instructions (Signed)
 Food Resources  Agency Name: [facility] Agency Address: [address] [place], [place] [address] Phone: [phone] Website: www.[email or web address].org Service(s) Offered: Housing services, self-sufficiency, congregate meal program, weatherization program, Event organiser program, emergency food assistance,  housing counseling, home ownership program, wheels - to work program.  [employer] free for 60 and older at various locations from [employer], Monday-Friday:  [employer], [address] [place], [phone] -[facility], [address], [place] [phone]  -[facility], [address], [place] [phone]  -[address], [address], [place], [phone]  Agency Name: [facility] Address: [phone] [address], [place], [place] [address] Phone: [phone] Website: www.[email or web address] Service(s) Offered: Home delivered hot, frozen, and emergency  meals. Grocery assistance program which matches  volunteers one-on-one with seniors unable to grocery shop  for themselves. Must be 60 years and older; less than 20  hours of in-home aide service, limited or no driving ability;  live alone or with someone with a disability; live in  [place].  Agency Name: [employer] [facility] [employer]) Address: [address] [place], [place] [address] Phone: [phone] Service(s) Offered: Food is served to shut-ins, homeless, elderly, and low income people in the community every Saturday (11:30 am-12:30 pm) and Sunday (12:30 pm-1:30pm). Volunteers also offer help and encouragement in seeking employment,  and spiritual guidance.  Agency Name: [employer] Address: [address] [place], [place] [address] Phone: [phone] Service(s) Offered: Child support services; child welfare services; food stamps; Medicaid; work first family assistance; and aid  with fuel,  rent, food and medicine.  Agency Name: [employer] Address: [address] [place], [place] Phone: [phone] Website: www.dreamalign.com Services Offered: Monday 10:00am-12:00, 8:00pm-9:00pm, and Friday 10:00am-12:00.  Agency Name: [employer] [facility] Address: [address], [place], [place] [address] Phone: [phone] Website: www.[email or web address].org Service(s) Offered: Serves weekday meals, open from 11:30 am- 1:00 pm., and 6:30-7:30pm, Monday-Wednesday-Friday distributes food 3:30-6pm, Monday-Wednesday-Friday.  Agency Name: [facility] Address: [address] [place], [place] Phone: [phone] Website: [email or web address].[email or web address].[email or web address] Services Offered: Distributes food the 4th Saturday of the month, starting at 8:00 am  Agency Name: [facility] Address: [phone] [address] [place], [place] [address] Phone: [phone] Website: [email or web address]://hbc..net Service(s) Offered: Bread of life, weekly food pantry. Open Wednesdays from 10:00am-noon.  Agency Name: [facility] [employer] Address: [address] [place], [place], [place] Phone: [phone] Services Offered: Distributes food 9am-1pm, Monday-Thursday. Call for details.  Agency Name: [facility] Address: [address], [place], [place] [address] Phone: [phone] Website: [email or web address] Service(s) Offered: Games developer. Call for assistance.  Agency Name: [facility] Address: [address] [place], [place] [address] Phone: [phone] Service Offered: Emergency Food Pantry. Call for appointment.  Agency Name: [facility] Address: [address], [place], [place] [address] Phone: [phone] Website: [email or web address] Services Offered: Games developer. Call for details  Agency Name: [facility] Address: [address] [place], [place] Phone:  [phone] Website: [email or web address] Service(s) Offered: Emergency Food Pantry. Call for details.  Agency Name: [employer] representative Address: [address] [place], [place] [address] Phone: [phone] or [phone] Website: [email or web address] Service(s) Offered: Distribute food 9am-11:30 am, Tuesday-Friday, and 1-3:30pm, Monday-Friday. Food pantry Monday-Friday 1pm-3pm, fresh items, Mon.-Wed.-Fri.  Agency Name: [facility] Address: [address] [place], [place] [address] Phone: [phone] Website: www.safealamance.org Services Offered: Distribute food Tues and Sats from 9:00am-noon.  Closed 1st Saturday of each month. Call for details  Agency Name: [employer] Address: [place] [place] [facility] [address] [place] [address] Phone: [phone]  Services Offered: Delivers meals every Thursday   Agency Name: [facility] Agency Address: [address] [place], [place] [address] Phone: [phone] Website: [email or web address].[email or web address] Service(s) Offered: Housing services, self-sufficiency, congregate meal program, and individual development account program.  Agency Name: [employer] [facility] [employer] Address: [address] [place], [place] [address] Phone: [phone] Email: [email or web address] .org Website: [email or web address] Service(s) Offered: Housing the homeless, feeding the hungry, Company secretary, job and education related services.  Agency Name: [facility] Address: [address], [place], [place] [address] Phone: [phone] Email: [email or web address] Service(s) Offered: Counseling, problem pregnancy, advocacy for Hispanics, limited emergency financial assistance.  Agency Name: Department [employer] Social Services Address: [address] [place], [place] [address] Phone: [phone] Website: [email or web address] Service(s) Offered: Child  support services; child welfare services; SNAP; Medicaid; work first family assistance; and aid with fuel,  rent, food and medicine.  Agency Name: [employer] representative Address: [address], [place], [place] [address] Phone: [phone] or [phone] Email: [email or web address] Service(s) Offered: Family services and transient assistance; emergency food, fuel, clothing, limited furniture, utilities; budget counseling, general counseling; give a kid a coat; thrift store; Christmas food and toys. Utility assistance, food pantry, rental  assistance, life sustaining medicine

## 2024-07-12 NOTE — Progress Notes (Signed)
 Patient clinically stable post IR Left nephrostomy tube placement per DR Philip, tolerated well. Denies complaints immediately post procedure. Received Versed  2 mg along with Fentanyl   100 mcg IV for procedure.  Report given to [facility] post procedure/specials/19

## 2024-07-12 NOTE — H&P (View-Only) (Signed)
 " Progress Note    07/12/2024 5:18 PM 1 Day Post-Op  Subjective:  Cameron [name] is a 64 yo male who is now POD #1 from:  Date of Surgery: 07/11/2024   Surgeon(s):DEW,JASON     Assistants:none   Pre-operative Diagnosis: PAD with rest pain LLE   Post-operative diagnosis:  Same   Procedure(s) Performed:             1.  Ultrasound guidance for vascular access right femoral artery             2.  Catheter placement into left common femoral artery from right femoral approach             3.  Aortogram and selective left lower extremity angiogram             4.  StarClose closure device right femoral artery   EBL: 5 cc   Contrast: 35 cc   Fluoro Time: 0.8 minutes   Moderate Conscious Sedation Time: approximately 28 minutes using 2 mg of Versed  and 50 mcg of Fentanyl   Patient resting comfortably this morning.  No complaints overnight.  Vitals are remained stable.  Continues to endorse rest pain of the left lower extremity as expected.  Surgery scheduled for tomorrow.   Vitals:   07/12/24 1040 07/12/24 1101  BP: 115/77 (!) 134/90  Pulse: 63 80  Resp: 19 18  Temp:  97.8 F (36.6 C)  SpO2: 93% 96%   Physical Exam: Cardiac:  RRR, normal S1 and S2.  No murmurs appreciated. Lungs: Lungs clear on auscultation throughout.  No rales rhonchi or wheezing.  Nonlabored breathing. Incisions: Right groin puncture site from angiogram.  Dressing clean dry and intact no hematoma seroma or infection to note. Extremities: All extremities are warm to touch with palpable pulses except left lower extremity cooler than his right.  Unable to palpate DP and PT pulse but does have a palpable popliteal and femoral pulse.  No open sores to bilateral lower extremities.  No gangrene to bilateral lower extremities. Abdomen: Positive bowel sounds throughout, soft, nontender nondistended. Neurologic: Alert and oriented x 3, answers all questions and follows commands.  CBC    Component Value Date/Time    WBC 6.9 07/12/2024 0509   RBC 4.49 07/12/2024 0509   HGB 13.7 07/12/2024 0509   HGB 10.6 (L) 03/26/2014 1232   HCT 41.9 07/12/2024 0509   HCT 32.1 (L) 03/26/2014 1232   PLT 119 (L) 07/12/2024 0509   PLT 137 (L) 03/26/2014 1232   MCV 93.3 07/12/2024 0509   MCV 106 (H) 03/26/2014 1232   MCH 30.5 07/12/2024 0509   MCHC 32.7 07/12/2024 0509   RDW 15.9 (H) 07/12/2024 0509   RDW 15.1 (H) 03/26/2014 1232   LYMPHSABS 0.9 08/17/2020 1837   LYMPHSABS 0.4 (L) 03/26/2014 1232   MONOABS 0.5 08/17/2020 1837   MONOABS 0.3 03/26/2014 1232   EOSABS 0.1 08/17/2020 1837   EOSABS 0.0 03/26/2014 1232   BASOSABS 0.1 08/17/2020 1837   BASOSABS 0.0 03/26/2014 1232    BMET    Component Value Date/Time   NA 140 07/12/2024 0509   NA 139 03/28/2014 0823   K 4.5 07/12/2024 0509   K 3.4 (L) 03/28/2014 0823   CL 105 07/12/2024 0509   CL 111 (H) 03/28/2014 0823   CO2 24 07/12/2024 0509   CO2 22 03/28/2014 0823   GLUCOSE 94 07/12/2024 0509   GLUCOSE 106 (H) 03/28/2014 0823   BUN 17 07/12/2024 0509   BUN  8 03/28/2014 0823   CREATININE 1.17 07/12/2024 0509   CREATININE 0.97 03/28/2014 0823   CALCIUM  9.2 07/12/2024 0509   CALCIUM  7.8 (L) 03/28/2014 0823   GFRNONAA >60 07/12/2024 0509   GFRNONAA >60 03/28/2014 0823   GFRAA >60 02/21/2020 0630   GFRAA >60 03/28/2014 0823    INR    Component Value Date/Time   INR 1.0 07/11/2024 1155     Intake/Output Summary (Last 24 hours) at 07/12/2024 1718 Last data filed at 07/12/2024 1355 Gross per 24 hour  Intake 240 ml  Output 75 ml  Net 165 ml     Assessment/Plan:  64 y.o. male is s/p see above 1 Day Post-Op   Plan Vascular surgery plans on taking the patient to the operating room tomorrow for left lower extremity femoral endarterectomy as well as left lower extremity femoral bypass grafting to distal artery.  I discussed again with the patient in detail the procedure, benefits, risk, complications.  Patient verbalized understanding wishes to  proceed.  Patient be made n.p.o. after midnight tonight for procedure tomorrow.  I answered all the patient's questions.  Patient was typed and screened for 2 units of blood if needed for the procedure.  Cell Saver was ordered for the operation as well.  Patient's last BUN and creatinine were 17 and 1.17.  Patient's last hemoglobin was 13.7 and his hematocrit was 41.9 with platelets of 119.  Patient is not on any anticoagulation at this time due to nephrostomy tube placement today by interventional radiology.  Patient does not need to be anticoagulated tonight prior procedure tomorrow per Dr. Selinda Gu MD.   DVT prophylaxis: None post nephrostomy tube placement.   Gwendlyn JONELLE Shank [facility] 07/12/2024 5:18 PM   "

## 2024-07-12 NOTE — Plan of Care (Signed)
" °  Problem: Health Behavior/Discharge Planning: Goal: Ability to manage health-related needs will improve Outcome: Progressing   Problem: Education: Goal: Knowledge of General Education information will improve Description: Including pain rating scale, medication(s)/side effects and non-pharmacologic comfort measures Outcome: Progressing   Problem: Clinical Measurements: Goal: Ability to maintain clinical measurements within normal limits will improve Outcome: Progressing Goal: Will remain free from infection Outcome: Progressing Goal: Diagnostic test results will improve Outcome: Progressing Goal: Respiratory complications will improve Outcome: Progressing Goal: Cardiovascular complication will be avoided Outcome: Progressing   Problem: Activity: Goal: Risk for activity intolerance will decrease Outcome: Progressing   Problem: Nutrition: Goal: Adequate nutrition will be maintained Outcome: Progressing   Problem: Coping: Goal: Level of anxiety will decrease Outcome: Progressing   Problem: Pain Managment: Goal: General experience of comfort will improve and/or be controlled Outcome: Progressing   "

## 2024-07-12 NOTE — Progress Notes (Signed)
 Mobility Specialist Progress Note:    07/12/24 1629  Mobility  Activity Ambulated with assistance  Level of Assistance Modified independent, requires aide device or extra time  Assistive Device None  Distance Ambulated (ft) 200 ft  Range of Motion/Exercises Active;All extremities  Activity Response Tolerated well  Mobility visit 1 Mobility  Mobility Specialist Start Time (ACUTE ONLY) 1619  Mobility Specialist Stop Time (ACUTE ONLY) 1629  Mobility Specialist Time Calculation (min) (ACUTE ONLY) 10 min   Pt ModI to stand and ambulate with no AD. Tolerated well, asx throughout. Returned to room, all needs met.   Sherrilee Ditty Mobility Specialist Please contact via Special Educational Needs Teacher or  Rehab office at [phone]

## 2024-07-13 ENCOUNTER — Encounter: Admission: AD | Payer: Self-pay | Source: Home / Self Care

## 2024-07-13 ENCOUNTER — Inpatient Hospital Stay: Payer: Self-pay

## 2024-07-13 ENCOUNTER — Encounter: Payer: Self-pay | Admitting: Vascular Surgery

## 2024-07-13 DIAGNOSIS — T82856A Stenosis of peripheral vascular stent, initial encounter: Secondary | ICD-10-CM | POA: Diagnosis not present

## 2024-07-13 DIAGNOSIS — I70222 Atherosclerosis of native arteries of extremities with rest pain, left leg: Secondary | ICD-10-CM | POA: Diagnosis not present

## 2024-07-13 HISTORY — PX: APPLICATION OF CELL SAVER: SHX7529

## 2024-07-13 HISTORY — PX: ENDARTERECTOMY TIBIOPERONEAL: SHX5808

## 2024-07-13 HISTORY — PX: ENDARTERECTOMY FEMORAL: SHX5804

## 2024-07-13 HISTORY — PX: FEMORAL-TIBIAL BYPASS GRAFT: SHX938

## 2024-07-13 LAB — CBC
HCT: 42.2 % (ref 39.0–52.0)
Hemoglobin: 13.5 g/dL (ref 13.0–17.0)
MCH: 30.4 pg (ref 26.0–34.0)
MCHC: 32 g/dL (ref 30.0–36.0)
MCV: 95 fL (ref 80.0–100.0)
Platelets: 116 K/uL — ABNORMAL LOW (ref 150–400)
RBC: 4.44 MIL/uL (ref 4.22–5.81)
RDW: 16.2 % — ABNORMAL HIGH (ref 11.5–15.5)
WBC: 8.3 K/uL (ref 4.0–10.5)
nRBC: 0 % (ref 0.0–0.2)

## 2024-07-13 LAB — BASIC METABOLIC PANEL WITH GFR
Anion gap: 11 (ref 5–15)
BUN: 23 mg/dL (ref 8–23)
CO2: 24 mmol/L (ref 22–32)
Calcium: 9.1 mg/dL (ref 8.9–10.3)
Chloride: 102 mmol/L (ref 98–111)
Creatinine, Ser: 1.44 mg/dL — ABNORMAL HIGH (ref 0.61–1.24)
GFR, Estimated: 55 mL/min — ABNORMAL LOW
Glucose, Bld: 101 mg/dL — ABNORMAL HIGH (ref 70–99)
Potassium: 4.1 mmol/L (ref 3.5–5.1)
Sodium: 137 mmol/L (ref 135–145)

## 2024-07-13 LAB — GLUCOSE, CAPILLARY: Glucose-Capillary: 130 mg/dL — ABNORMAL HIGH (ref 70–99)

## 2024-07-13 MED ORDER — VASHE WOUND IRRIGATION OPTIME
TOPICAL | Status: DC | PRN
  Administered 2024-07-13: 34 [oz_av]

## 2024-07-13 MED ORDER — CHLORHEXIDINE GLUCONATE 4 % EX SOLN: 60.0000 mL | Freq: Once | CUTANEOUS | Status: DC

## 2024-07-13 MED ORDER — LIDOCAINE HCL (CARDIAC) PF 100 MG/5ML IV SOSY
PREFILLED_SYRINGE | INTRAVENOUS | Status: DC | PRN
  Administered 2024-07-13: 100 mg via INTRAVENOUS

## 2024-07-13 MED ORDER — PHENYLEPHRINE 80 MCG/ML (10ML) SYRINGE FOR IV PUSH (FOR BLOOD PRESSURE SUPPORT)
PREFILLED_SYRINGE | INTRAVENOUS | Status: AC
  Filled 2024-07-13: qty 10

## 2024-07-13 MED ORDER — HEMOSTATIC AGENTS (NO CHARGE) OPTIME
TOPICAL | Status: DC | PRN
  Administered 2024-07-13 (×4): 1 via TOPICAL

## 2024-07-13 MED ORDER — GENTAMICIN SULFATE 40 MG/ML IJ SOLN
INTRAMUSCULAR | Status: AC
  Filled 2024-07-13: qty 4

## 2024-07-13 MED ORDER — HEPARIN SODIUM (PORCINE) 1000 UNIT/ML IJ SOLN
INTRAMUSCULAR | Status: DC | PRN
  Administered 2024-07-13: 2000 [IU] via INTRAVENOUS
  Administered 2024-07-13: 6000 [IU] via INTRAVENOUS

## 2024-07-13 MED ORDER — ROCURONIUM BROMIDE 100 MG/10ML IV SOLN
INTRAVENOUS | Status: DC | PRN
  Administered 2024-07-13: 50 mg via INTRAVENOUS
  Administered 2024-07-13 (×2): 20 mg via INTRAVENOUS
  Administered 2024-07-13: 50 mg via INTRAVENOUS
  Administered 2024-07-13: 30 mg via INTRAVENOUS

## 2024-07-13 MED ORDER — LACTATED RINGERS IV SOLN: INTRAVENOUS | Status: DC | PRN

## 2024-07-13 MED ORDER — NOREPINEPHRINE 4 MG/250ML-% IV SOLN
INTRAVENOUS | Status: DC | PRN
  Administered 2024-07-13: 2 ug/min via INTRAVENOUS

## 2024-07-13 MED ORDER — ALBUMIN HUMAN 5 % IV SOLN: INTRAVENOUS | Status: DC | PRN

## 2024-07-13 MED ORDER — ONDANSETRON HCL 4 MG/2ML IJ SOLN
INTRAMUSCULAR | Status: DC | PRN
  Administered 2024-07-13: 4 mg via INTRAVENOUS

## 2024-07-13 MED ORDER — CEFAZOLIN SODIUM-DEXTROSE 2-4 GM/100ML-% IV SOLN
2.0000 g | INTRAVENOUS | Status: AC
  Administered 2024-07-13 (×2): 2 g via INTRAVENOUS

## 2024-07-13 MED ORDER — FENTANYL CITRATE (PF) 100 MCG/2ML IJ SOLN
INTRAMUSCULAR | Status: AC
  Filled 2024-07-13: qty 2

## 2024-07-13 MED ORDER — GENTAMICIN SULFATE 40 MG/ML IJ SOLN
INTRAMUSCULAR | Status: DC | PRN
  Administered 2024-07-13: 160 mg

## 2024-07-13 MED ORDER — FENTANYL CITRATE (PF) 100 MCG/2ML IJ SOLN
25.0000 ug | INTRAMUSCULAR | Status: DC | PRN
  Administered 2024-07-13 (×2): 25 ug via INTRAVENOUS

## 2024-07-13 MED ORDER — PHENYLEPHRINE 80 MCG/ML (10ML) SYRINGE FOR IV PUSH (FOR BLOOD PRESSURE SUPPORT)
PREFILLED_SYRINGE | INTRAVENOUS | Status: DC | PRN
  Administered 2024-07-13 (×4): 160 ug via INTRAVENOUS

## 2024-07-13 MED ORDER — OXYCODONE HCL 5 MG PO TABS: 5.0000 mg | ORAL_TABLET | Freq: Once | ORAL | Status: DC | PRN

## 2024-07-13 MED ORDER — NOREPINEPHRINE BITARTRATE 1 MG/ML IV SOLN
INTRAVENOUS | Status: DC | PRN
  Administered 2024-07-13: .5 mL via INTRAVENOUS
  Administered 2024-07-13 (×2): 1 mL via INTRAVENOUS
  Administered 2024-07-13: .5 mL via INTRAVENOUS

## 2024-07-13 MED ORDER — PROTAMINE SULFATE 10 MG/ML IV SOLN
INTRAVENOUS | Status: AC
  Filled 2024-07-13: qty 25

## 2024-07-13 MED ORDER — PROPOFOL 10 MG/ML IV BOLUS
INTRAVENOUS | Status: DC | PRN
  Administered 2024-07-13: 120 mg via INTRAVENOUS

## 2024-07-13 MED ORDER — SUGAMMADEX SODIUM 200 MG/2ML IV SOLN
INTRAVENOUS | Status: DC | PRN
  Administered 2024-07-13: 200 mg via INTRAVENOUS

## 2024-07-13 MED ORDER — CEFAZOLIN SODIUM-DEXTROSE 2-4 GM/100ML-% IV SOLN
INTRAVENOUS | Status: AC
  Filled 2024-07-13: qty 100

## 2024-07-13 MED ORDER — SODIUM CHLORIDE 0.9 % IV SOLN: INTRAVENOUS | Status: DC | PRN

## 2024-07-13 MED ORDER — ACETAMINOPHEN 10 MG/ML IV SOLN
INTRAVENOUS | Status: AC
  Filled 2024-07-13: qty 100

## 2024-07-13 MED ORDER — HEPARIN 30,000 UNITS/1000 ML (OHS) CELLSAVER SOLUTION
Status: AC
  Filled 2024-07-13: qty 1000

## 2024-07-13 MED ORDER — PROPOFOL 10 MG/ML IV BOLUS
INTRAVENOUS | Status: AC
  Filled 2024-07-13: qty 20

## 2024-07-13 MED ORDER — ASPIRIN 81 MG PO TBEC
81.0000 mg | DELAYED_RELEASE_TABLET | Freq: Every day | ORAL | Status: DC
  Administered 2024-07-13 – 2024-07-20 (×8): 81 mg via ORAL
  Filled 2024-07-13 (×8): qty 1

## 2024-07-13 MED ORDER — ACETAMINOPHEN 10 MG/ML IV SOLN: 1000.0000 mg | Freq: Once | INTRAVENOUS | Status: DC | PRN

## 2024-07-13 MED ORDER — OXYCODONE HCL 5 MG/5ML PO SOLN: 5.0000 mg | Freq: Once | ORAL | Status: DC | PRN

## 2024-07-13 MED ORDER — NITROGLYCERIN IN D5W 200-5 MCG/ML-% IV SOLN
INTRAVENOUS | Status: AC
  Filled 2024-07-13: qty 250

## 2024-07-13 MED ORDER — HEPARIN SODIUM (PORCINE) 5000 UNIT/ML IJ SOLN
INTRAMUSCULAR | Status: AC
  Filled 2024-07-13: qty 1

## 2024-07-13 MED ORDER — FENTANYL CITRATE (PF) 100 MCG/2ML IJ SOLN
INTRAMUSCULAR | Status: DC | PRN
  Administered 2024-07-13 (×2): 50 ug via INTRAVENOUS

## 2024-07-13 MED ORDER — CLOPIDOGREL BISULFATE 75 MG PO TABS
75.0000 mg | ORAL_TABLET | Freq: Every day | ORAL | Status: DC
  Administered 2024-07-13 – 2024-07-15 (×3): 75 mg via ORAL
  Filled 2024-07-13 (×4): qty 1

## 2024-07-13 MED ORDER — NOREPINEPHRINE 4 MG/250ML-% IV SOLN
INTRAVENOUS | Status: AC
  Filled 2024-07-13: qty 250

## 2024-07-13 MED ORDER — HYDROMORPHONE HCL 1 MG/ML IJ SOLN
INTRAMUSCULAR | Status: AC
  Filled 2024-07-13: qty 1

## 2024-07-13 MED ORDER — SODIUM CHLORIDE 0.9 % IV SOLN
INTRAVENOUS | Status: DC | PRN
  Administered 2024-07-13: 500 mL

## 2024-07-13 MED ORDER — HEPARIN 30,000 UNITS/1000 ML (OHS) CELLSAVER SOLUTION
Status: AC | PRN
  Administered 2024-07-13: 1

## 2024-07-13 MED ORDER — ACETAMINOPHEN 10 MG/ML IV SOLN
INTRAVENOUS | Status: DC | PRN
  Administered 2024-07-13: 1000 mg via INTRAVENOUS

## 2024-07-13 MED ORDER — VANCOMYCIN HCL 1000 MG IV SOLR
INTRAVENOUS | Status: AC
  Filled 2024-07-13: qty 20

## 2024-07-13 MED ORDER — SODIUM CHLORIDE 0.9 % IV SOLN: INTRAVENOUS | Status: DC

## 2024-07-13 MED ORDER — HYDROMORPHONE HCL 1 MG/ML IJ SOLN
INTRAMUSCULAR | Status: DC | PRN
  Administered 2024-07-13 (×2): .5 mg via INTRAVENOUS

## 2024-07-13 MED ORDER — EPHEDRINE SULFATE-NACL 50-0.9 MG/10ML-% IV SOSY
PREFILLED_SYRINGE | INTRAVENOUS | Status: DC | PRN
  Administered 2024-07-13 (×6): 5 mg via INTRAVENOUS

## 2024-07-13 MED ORDER — DROPERIDOL 2.5 MG/ML IJ SOLN: 0.6250 mg | Freq: Once | INTRAMUSCULAR | Status: DC | PRN

## 2024-07-13 MED ORDER — VANCOMYCIN HCL 1000 MG IV SOLR
INTRAVENOUS | Status: DC | PRN
  Administered 2024-07-13: 1000 mg

## 2024-07-13 NOTE — Progress Notes (Signed)
 Patient belongings to include cellphone, reading glasses, sunglasses, hat, and white paper with numbers on it taken to patient in ICU Rm 01 and given to him at bedside by this RN.

## 2024-07-13 NOTE — Progress Notes (Signed)
 " [facility] CLINIC CARDIOLOGY PROGRESS NOTE       Patient ID: Cameron [name] MRN: [id number] DOB/AGE: Nov 29, 1960 64 y.o.  Admit date: 07/11/2024 Referring Physician Dr. Selinda Gu Primary Physician Cameron Rea BROCKS, NP  Primary Cardiologist Dr. Dewane Reason for Consultation POC  HPI: Cameron [name] is a 64 y.o. male  with a past medical history of chronic systolic CHF with apical akinesis with an EF of 20%, coronary artery disease with STEMI in 2017 with PCI and stent to LAD, paroxysmal atrial fibrillation, NSVT, apical mural thrombus, hypertension, hyperlipidemia who presented on 07/11/2024 for outpatient peripheral vascular catheterization. Found to have worsening of his PAD and needs bypass.  Cardiology was consulted for pre-operative evaluation.   Interval history: -Patient seen and examined this AM, sitting upright on side of bed.  -States he feels well overall today with no complaints of CP or SOB. Reports feeling a little anxious. -BP and HR remain stable, no events noted on tele. BP remains stable.   Review of systems complete and found to be negative unless listed above    Past Medical History:  Diagnosis Date   Arthritis    CHF (congestive heart failure) (HCC)    Coronary artery disease    a. 12/17 PCI with DES to pLAD with resdiual disease (RX therapy)   Depression    Global hypokinesis of left ventricle    Gout    Grade II diastolic dysfunction    History of kidney stones    History of percutaneous coronary intervention 2017   Hyperlipidemia    Hypertension    Left atrial dilation    Neuromuscular disorder (HCC)    leg numb/ can walk by self   PAD (peripheral artery disease)    2 stents in left leg   Right atrial dilation    S/P drug eluting coronary stent placement 2017   STEMI (ST elevation myocardial infarction) (HCC) 2017   Substance abuse (HCC)    alcohol and marijuana    Past Surgical History:  Procedure Laterality Date   CARDIAC CATHETERIZATION N/A  06/17/2016   Procedure: Left Heart Cath and Coronary Angiography;  Surgeon: Dorn JINNY Lesches, MD;  Location: [facility];  Service: Cardiovascular;  Laterality: N/A;   CARDIAC CATHETERIZATION N/A 06/17/2016   Procedure: Coronary Stent Intervention;  Surgeon: Dorn JINNY Lesches, MD;  Location: [facility];  Service: Cardiovascular;  Laterality: N/A;   cardiac stents     CATARACT EXTRACTION W/PHACO Right 05/30/2024   Procedure: PHACOEMULSIFICATION, CATARACT, WITH IOL INSERTION 2.49 00:23.5;  Surgeon: Myrna Adine Anes, MD;  Location: [facility];  Service: Ophthalmology;  Laterality: Right;   CATARACT EXTRACTION W/PHACO Left 06/13/2024   Procedure: PHACOEMULSIFICATION, CATARACT, WITH IOL INSERTION 2.52 00:27.8;  Surgeon: Myrna Adine Anes, MD;  Location: [facility];  Service: Ophthalmology;  Laterality: Left;   CERVICAL SPINE SURGERY     IR NEPHROSTOMY PLACEMENT LEFT  07/12/2024   LOWER EXTREMITY ANGIOGRAPHY Left 06/25/2020   Procedure: LOWER EXTREMITY ANGIOGRAPHY;  Surgeon: Gu Selinda RAMAN, MD;  Location: [facility] INVASIVE CV LAB;  Service: Cardiovascular;  Laterality: Left;   LOWER EXTREMITY ANGIOGRAPHY Left 03/11/2023   Procedure: Lower Extremity Angiography;  Surgeon: Gu Selinda RAMAN, MD;  Location: [facility] INVAS[facility] CV [facility];  Service: Cardiovascular;  Laterality: Left;   LOWER EXTREMITY INTERVENTION Left 07/11/2024   Procedure: Lower Extremity Angiography;  Surgeon: Gu Selinda RAMAN, MD;  Location: [facility] INVASIVE CV [facility];  Service: Cardiovascular;  Laterality: Left;  Medications Prior to Admission  Medication Sig Dispense Refill Last Dose/Taking   acetaminophen  (TYLENOL ) 325 MG tablet Take 2 tablets (650 mg total) by mouth every 6 (six) hours as needed for moderate pain, mild pain, fever or headache.   Unknown   allopurinol  (ZYLOPRIM ) 300 MG tablet Take 300 mg by mouth daily.   07/11/2024 Morning   aspirin  EC 81 MG tablet Take 1 tablet (81 mg total) by mouth daily. Swallow whole. 30  tablet 12 07/10/2024   atorvastatin  (LIPITOR ) 80 MG tablet Take 1 tablet (80 mg total) by mouth daily. (Patient taking differently: Take 40 mg by mouth daily.)   07/11/2024 Morning   colchicine  0.6 MG tablet Take 0.6 mg by mouth.   Past Week   dapagliflozin  propanediol (FARXIGA ) 10 MG TABS tablet Take 10 mg by mouth daily.   07/11/2024 Morning   ELIQUIS  5 MG TABS tablet Take 1 tablet by mouth 2 (two) times daily.   07/10/2024   furosemide  (LASIX ) 40 MG tablet Take 40 mg by mouth 2 (two) times daily.   07/11/2024 Morning   gabapentin  (NEURONTIN ) 300 MG capsule Take 300 mg by mouth 3 (three) times daily.   07/10/2024   ibuprofen (ADVIL) 200 MG tablet Take 200 mg by mouth every 6 (six) hours as needed for headache or mild pain.   Unknown   metoprolol  succinate (TOPROL -XL) 25 MG 24 hr tablet Take 12.5 mg by mouth.   07/11/2024 Morning   nicotine  (NICODERM CQ  - DOSED IN MG/24 HOURS) 14 mg/24hr patch One 14 mg patch chest wall daily as needed for nicotine  craving.  (Can substitute generic) 28 patch 0 Unknown   nitroGLYCERIN  (NITROSTAT ) 0.4 MG SL tablet Place under the tongue.   Unknown   Polyethylene Glycol 4500 POWD 1 application  by Does not apply route as needed.   Unknown   sertraline (ZOLOFT) 100 MG tablet Take 100 mg by mouth daily.   07/11/2024   spironolactone  (ALDACTONE ) 25 MG tablet Take 25 mg by mouth 2 (two) times daily.   07/11/2024 Morning   collagenase  (SANTYL ) 250 UNIT/GM ointment Apply topically.      hydrocortisone  (ANUSOL -HC) 25 MG suppository One suppository twice a day as needed for hemorrhoids.  (Generic okay to substitute) (Patient not taking: Reported on 07/12/2024) 12 suppository 0 Not Taking   oxyCODONE  (OXY IR/ROXICODONE ) 5 MG immediate release tablet Take 1 tablet (5 mg total) by mouth every 8 (eight) hours as needed for severe pain (pain score 7-10). (Patient not taking: Reported on 07/11/2024) 20 tablet 0 Not Taking   pantoprazole  (PROTONIX ) 40 MG tablet Take 1 tablet (40 mg total) by mouth daily.  30 tablet 0    predniSONE  (DELTASONE ) 20 MG tablet Take 20 mg by mouth 2 (two) times daily. (Patient not taking: Reported on 06/22/2024)   Not Taking   sertraline (ZOLOFT) 50 MG tablet Take 50 mg by mouth daily. (Patient not taking: Reported on 07/11/2024)   Not Taking   Social History   Socioeconomic History   Marital status: Divorced    Spouse name: Not on file   Number of children: Not on file   Years of education: Not on file   Highest education level: Not on file  Occupational History   Not on file  Tobacco Use   Smoking status: Every Day    Current packs/day: 1.00    Types: Cigarettes   Smokeless tobacco: Never  Vaping Use   Vaping status: Never Used  Substance and Sexual Activity  Alcohol use: Not Currently   Drug use: Yes    Frequency: 1.0 times per week    Types: Marijuana    Comment: twice monthly   Sexual activity: Not Currently  Other Topics Concern   Not on file  Social History Narrative   Not on file   Social Drivers of Health   Tobacco Use: High Risk (07/11/2024)   Patient History    Smoking Tobacco Use: Every Day    Smokeless Tobacco Use: Never    Passive Exposure: Not on file  Financial Resource Strain: Low Risk  (03/24/2024)   Received from [facility]   Overall Financial Resource Strain (CARDIA)    Difficulty of Paying Living Expenses: Not hard at all  Food Insecurity: Food Insecurity Present (07/11/2024)   Epic    Worried About Programme Researcher, Broadcasting/film/video in the Last Year: Often true    Ran Out of Food in the Last Year: Never true  Transportation Needs: No Transportation Needs (07/11/2024)   Epic    Lack of Transportation (Medical): No    Lack of Transportation (Non-Medical): No  Physical Activity: Not on file  Stress: Not on file  Social Connections: Socially Isolated (07/11/2024)   Social Connection and Isolation Panel    Frequency of Communication with Friends and Family: Never    Frequency of Social Gatherings with Friends and Family:  Never    Attends Religious Services: Never    Database Administrator or Organizations: No    Attends Banker Meetings: Never    Marital Status: Divorced  Catering Manager Violence: Not At Risk (07/11/2024)   Epic    Fear of Current or Ex-Partner: No    Emotionally Abused: No    Physically Abused: No    Sexually Abused: No  Depression (PHQ2-9): Not on file  Alcohol Screen: Not on file  Housing: Low Risk (07/11/2024)   Epic    Unable to Pay for Housing in the Last Year: No    Number of Times Moved in the Last Year: 1    Homeless in the Last Year: No  Utilities: At Risk (07/11/2024)   Epic    Threatened with loss of utilities: Yes  Health Literacy: Not on file    Family History  Problem Relation Age of Onset   CAD Paternal Grandfather    Breast cancer Mother    Parkinson's disease Father    Alzheimer's disease Father    Hypertension Father    Diabetes Father      Vitals:   07/12/24 2139 07/12/24 2306 07/13/24 0443 07/13/24 0807  BP:  102/66 107/79 96/67  Pulse:  83 80 82  Resp:  20 20 16   Temp:  98.3 F (36.8 C) 98.6 F (37 C) 98.4 F (36.9 C)  TempSrc:  Oral Oral   SpO2:  98% 98% 98%  Weight: 76.9 kg     Height:        PHYSICAL EXAM General: Well appearing male, well nourished, in no acute distress. HEENT: Normocephalic and atraumatic. Neck: No JVD.  Lungs: Normal respiratory effort on room air. Clear bilaterally to auscultation. No wheezes, crackles, rhonchi.  Heart: HRRR. Normal S1 and S2 without gallops or murmurs.  Abdomen: Non-distended appearing.  Msk: Normal strength and tone for age. Extremities: Warm and well perfused. No clubbing, cyanosis. No edema.  Neuro: Alert and oriented X 3. Psych: Answers questions appropriately.   Labs: Basic Metabolic Panel: Recent Labs    07/12/24 0509 07/12/24  2349  NA 140 137  K 4.5 4.1  CL 105 102  CO2 24 24  GLUCOSE 94 101*  BUN 17 23  CREATININE 1.17 1.44*  CALCIUM  9.2 9.1   Liver Function  Tests: Recent Labs    07/11/24 1744  AST 22  ALT 12  ALKPHOS 121  BILITOT 0.4  PROT 7.0  ALBUMIN  4.1   No results for input(s): LIPASE, AMYLASE in the last 72 hours. CBC: Recent Labs    07/12/24 0509 07/12/24 2349  WBC 6.9 8.3  HGB 13.7 13.5  HCT 41.9 42.2  MCV 93.3 95.0  PLT 119* 116*   Cardiac Enzymes: No results for input(s): CKTOTAL, CKMB, CKMBINDEX, TROPONINIHS in the last 72 hours. BNP: No results for input(s): BNP in the last 72 hours. D-Dimer: No results for input(s): DDIMER in the last 72 hours. Hemoglobin A1C: No results for input(s): HGBA1C in the last 72 hours. Fasting Lipid Panel: No results for input(s): CHOL, HDL, LDLCALC, TRIG, CHOLHDL, LDLDIRECT in the last 72 hours. Thyroid  Function Tests: No results for input(s): TSH, T4TOTAL, T3FREE, THYROIDAB in the last 72 hours.  Invalid input(s): FREET3 Anemia Panel: No results for input(s): VITAMINB12, FOLATE, FERRITIN, TIBC, IRON, RETICCTPCT in the last 72 hours.   Radiology: IR NEPHROSTOMY PLACEMENT LEFT Result Date: 07/12/2024 INDICATION: 64 year old with left hydronephrosis secondary to a bladder mass. Request for a percutaneous nephrostomy tube placement. EXAM: PLACEMENT OF LEFT NEPHROSTOMY TUBE WITH ULTRASOUND AND FLUOROSCOPIC GUIDANCE COMPARISON:  CT abdomen pelvis 06/28/2024 MEDICATIONS: Rocephin  2 g; The antibiotic was administered in an appropriate time frame prior to skin puncture. ANESTHESIA/SEDATION: Moderate (conscious) sedation was employed during this procedure. A total of Versed  2 mg and Fentanyl  100 mcg was administered intravenously by the radiology nurse. Total intra-service moderate Sedation Time: 23 minutes. The patient's level of consciousness and vital signs were monitored continuously by radiology nursing throughout the procedure under my direct supervision. CONTRAST:  15 mL Omnipaque  300-administered into the collecting system(s) FLUOROSCOPY:  Radiation Exposure Index (as provided by the fluoroscopic device): 4 mGy Kerma COMPLICATIONS: None immediate. PROCEDURE: Informed written consent was obtained from the patient after a thorough discussion of the procedural risks, benefits and alternatives. All questions were addressed. Maximal Sterile Barrier Technique was utilized including caps, mask, sterile gowns, sterile gloves, sterile drape, hand hygiene and skin antiseptic. A timeout was performed prior to the initiation of the procedure. Patient was placed prone. Left flank was prepped and draped in sterile fashion. Ultrasound was used to identify the left kidney. Ultrasound image was saved for documentation. Skin was anesthetized with 1% lidocaine . Using ultrasound guidance, a 22 gauge needle was directed into a mildly dilated lower pole calyx. Contrast injection confirmed placement in the renal collecting system. 0.018 wire was advanced into the renal collecting system. Accustick dilator set was placed. The tract was dilated over a superstiff Amplatz wire and a 10 French multipurpose drain was reconstituted in the renal pelvis. Contrast injection confirmed placement in the renal pelvis. Drain was sutured to skin and attached to a gravity bag. Fluoroscopic and ultrasound images were taken and saved for documentation. FINDINGS: Mild to moderate left hydronephrosis. Nephrostomy tube was placed via a lower pole calyx. Nephrostomy tube was reconstituted in the renal pelvis. IMPRESSION: Successful placement of a left percutaneous nephrostomy tube using ultrasound and fluoroscopic guidance. Electronically Signed   By: Juliene Balder M.D.   On: 07/12/2024 12:39   PERIPHERAL VASCULAR CATHETERIZATION Result Date: 07/11/2024 See surgical note for result.  CT HEMATURIA WORKUP  Result Date: 07/02/2024 CLINICAL DATA:  Gross hematuria * Tracking Code: BO * EXAM: CT ABDOMEN AND PELVIS WITHOUT AND WITH CONTRAST TECHNIQUE: Multidetector CT imaging of the abdomen and pelvis  was performed following the standard protocol before and following the bolus administration of intravenous contrast. RADIATION DOSE REDUCTION: This exam was performed according to the departmental dose-optimization program which includes automated exposure control, adjustment of the mA and/or kV according to patient size and/or use of iterative reconstruction technique. CONTRAST:  OMNIPAQUE  IOHEXOL  300 MG/ML  SOLN COMPARISON:  05/21/2024 FINDINGS: Lower chest: No acute abnormality.  Coronary artery calcifications. Hepatobiliary: No solid liver abnormality is seen. Cholecystectomy. No biliary ductal dilatation. Pancreas: Unremarkable. No pancreatic ductal dilatation or surrounding inflammatory changes. Spleen: Normal in size without significant abnormality. Adrenals/Urinary Tract: Adrenal glands are unremarkable. Mild left hydronephrosis and hydroureter. Small nonobstructive bilateral renal calculi and or renal vascular calcifications. Contrast enhancing endoluminal mass of the posterior left aspect of the bladder overlying and obstructing the ureterovesicular junction measuring 3.8 x 2.0 cm (series 8, image 74). Stomach/Bowel: Stomach is within normal limits. Appendix appears normal. Sigmoid diverticulosis. Mild, long segment wall thickening of the descending and sigmoid colon (series 8, image 68). Vascular/Lymphatic: Aortic atherosclerosis. Unchanged prominent retroperitoneal lymph nodes, index aortocaval node measuring up to 1.0 x 0.9 cm (series 8, image 37). Reproductive: No mass or other significant abnormality. Other: Small fat containing right inguinal hernia. Fat containing parastomal hernia in the left lower quadrant (series 8, image 57). No ascites. Musculoskeletal: No acute or significant osseous findings. IMPRESSION: 1. Contrast enhancing endoluminal mass of the posterior left aspect of the bladder overlying and obstructing the ureterovesicular junction measuring 3.8 x 2.0 cm. Findings are  consistent with bladder malignancy. 2. Mild left hydronephrosis and hydroureter. 3. Unchanged prominent retroperitoneal lymph nodes, index aortocaval node measuring up to 1.0 x 0.9 cm. No definite evidence of lymphadenopathy or metastatic disease in the abdomen or pelvis. Consider PET-CT for metabolic characterization. 4. Small nonobstructive bilateral renal calculi and/or renal vascular calcifications. 5. Sigmoid diverticulosis. Mild, long segment wall thickening of the descending and sigmoid colon, suggestive of colitis, possibly reflecting chronic sequelae of prior diverticulitis. Appearance is similar to prior examination. 6. Coronary artery disease. Aortic Atherosclerosis (ICD10-I70.0). Electronically Signed   By: Marolyn JONETTA Jaksch M.D.   On: 07/02/2024 07:15   VAS US  LOWER EXTREMITY ARTERIAL DUPLEX Result Date: 06/27/2024 LOWER EXTREMITY ARTERIAL DUPLEX STUDY Patient Name:  JOSECARLOS HARRIOTT  Date of Exam:   06/22/2024 Medical Rec #: [id number]          Accession #:    [id number] Date of Birth: 07/15/1960          Patient Gender: M Patient Age:   70 years Exam Location:  [facility] Procedure:      VAS US  LOWER EXTREMITY ARTERIAL DUPLEX Referring Phys: ORVIN DARING --------------------------------------------------------------------------------  Indications: Peripheral artery disease.  Vascular Interventions: 06/25/20: Left EIA & SFA/popliteal stents with TP trunk,                         peroneal & ATA angioplasties;                         03/11/23: Left SFA/popliteal thrombectomy/PTA/stent with                         TP trunk & peroneal angioplasties;SABRA Current ABI:  Right=0.93 & Left=0.60 Performing Technologist: Elsie Churn RT, RDMS, RVT  Examination Guidelines: A complete evaluation includes B-mode imaging, spectral Doppler, color Doppler, and power Doppler as needed of all accessible portions of each vessel. Bilateral testing is considered an integral part of a complete  examination. Limited examinations for reoccurring indications may be performed as noted  +--------------+--------+-----+--------+-------------------+--------------+ LEFT          PSV cm/sRatioStenosisWaveform           Comments       +--------------+--------+-----+--------+-------------------+--------------+ CFA Mid       36                   hyperemic                         +--------------+--------+-----+--------+-------------------+--------------+ DFA           124                  hyperemic                         +--------------+--------+-----+--------+-------------------+--------------+ SFA Prox                   occluded                                  +--------------+--------+-----+--------+-------------------+--------------+ SFA Mid                    occluded                                  +--------------+--------+-----+--------+-------------------+--------------+ SFA Distal                 occluded                                  +--------------+--------+-----+--------+-------------------+--------------+ POP Prox                   occluded                                  +--------------+--------+-----+--------+-------------------+--------------+ POP Distal                 occluded                                  +--------------+--------+-----+--------+-------------------+--------------+ ATA Distal    17                   dampened monophasic               +--------------+--------+-----+--------+-------------------+--------------+ PTA Mid/Distal             occluded                                  +--------------+--------+-----+--------+-------------------+--------------+ PTA Distal    10                   dampened monophasicvia collateral +--------------+--------+-----+--------+-------------------+--------------+ PERO Distal   25  dampened monophasic                +--------------+--------+-----+--------+-------------------+--------------+  Summary: Left: Occlusion of the left SFA/popliteal artery stent and mid/distal PTA.  See table(s) above for measurements and observations. Electronically signed by Selinda Gu MD on 06/27/2024 at 7:54:41 AM.    Final    VAS US  ABI WITH/WO TBI Result Date: 06/27/2024  LOWER EXTREMITY DOPPLER STUDY Patient Name:  DYMIR NEESON  Date of Exam:   06/22/2024 Medical Rec #: [id number]          Accession #:    [id number] Date of Birth: [birth year]          Patient Gender: M Patient Age:   84 years Exam Location:  [facility] Procedure:      VAS US  ABI WITH/WO TBI Referring Phys: SELINDA DEW --------------------------------------------------------------------------------  Indications: Peripheral artery disease.  Vascular Interventions: 06/25/20: Left EIA & SFA/popliteal stents with TP trunk,                         peroneal & ATA angioplasties;                         03/11/23: Left SFA/popliteal thrombectomy/PTA/stent with                         TP trunk & peroneal angioplasties;SABRA Performing Technologist: Elsie Churn RT, RDMS, RVT  Examination Guidelines: A complete evaluation includes at minimum, Doppler waveform signals and systolic blood pressure reading at the level of bilateral brachial, anterior tibial, and posterior tibial arteries, when vessel segments are accessible. Bilateral testing is considered an integral part of a complete examination. Photoelectric Plethysmograph (PPG) waveforms and toe systolic pressure readings are included as required and additional duplex testing as needed. Limited examinations for reoccurring indications may be performed as noted.  ABI Findings: +---------+------------------+-----+--------+-------+ Right    Rt Pressure (mmHg)IndexWaveformComment +---------+------------------+-----+--------+-------+ Brachial 92                                      +---------+------------------+-----+--------+-------+ PTA      73                0.79 biphasic        +---------+------------------+-----+--------+-------+ DP       86                0.93 biphasic        +---------+------------------+-----+--------+-------+ Great Toe57                0.62 Abnormal        +---------+------------------+-----+--------+-------+ +---------+------------------+-----+-------------------+--------------+ Left     Lt Pressure (mmHg)IndexWaveform           Comment        +---------+------------------+-----+-------------------+--------------+ Brachial 89                                                       +---------+------------------+-----+-------------------+--------------+ PTA      55                0.60 dampened monophasicvia collateral +---------+------------------+-----+-------------------+--------------+ PERO     50  0.54 dampened monophasic               +---------+------------------+-----+-------------------+--------------+ DP       46                0.50 dampened monophasic               +---------+------------------+-----+-------------------+--------------+ Great Toe33                0.36 Abnormal                          +---------+------------------+-----+-------------------+--------------+ +-------+-----------+-----------+------------+------------+ ABI/TBIToday's ABIToday's TBIPrevious ABIPrevious TBI +-------+-----------+-----------+------------+------------+ Right  0.93       0.62       0.93        NA           +-------+-----------+-----------+------------+------------+ Left   0.60       0.36       0.51        NA           +-------+-----------+-----------+------------+------------+ TOES Findings: +----------+---------------+---------------+-------+ Right ToesPressure (mmHg)Waveform       Comment +----------+---------------+---------------+-------+ 1st Digit                Abnormal                +----------+---------------+---------------+-------+ 2nd Digit                mildly abnormal        +----------+---------------+---------------+-------+ 3rd Digit                mildly abnormal        +----------+---------------+---------------+-------+ 4th Digit                mildly abnormal        +----------+---------------+---------------+-------+ 5th Digit                Normal                 +----------+---------------+---------------+-------+  +---------+---------------+-----------------+-------+ Left ToesPressure (mmHg)Waveform         Comment +---------+---------------+-----------------+-------+ 1st Digit               severely dampened        +---------+---------------+-----------------+-------+ 2nd Digit               severely dampened        +---------+---------------+-----------------+-------+ 3rd Digit               severely dampened        +---------+---------------+-----------------+-------+ 4th Digit               severely dampened        +---------+---------------+-----------------+-------+ 5th Digit               severely dampened        +---------+---------------+-----------------+-------+  Bilateral ABIs appear essentially unchanged compared to prior study on 03/10/23 done at [facility] prior to last intervention.  Summary: Right: Resting right ankle-brachial index indicates mild right lower extremity arterial disease. The right toe-brachial index is abnormal.  Left: Resting left ankle-brachial index indicates moderate left lower extremity arterial disease. The left toe-brachial index is abnormal.  *See table(s) above for measurements and observations.  Electronically signed by Selinda Gu MD on 06/27/2024 at 7:54:32 AM.    Final     ECHO 03/29/2024: SEVERE LEFT VENTRICULAR SYSTOLIC DYSFUNCTION WITH NO LVH  ESTIMATED EF: 20%  NORMAL LA PRESSURES WITH DIASTOLIC DYSFUNCTION (  GRADE 1)  NORMAL RIGHT VENTRICULAR SYSTOLIC FUNCTION  VALVULAR  REGURGITATION: No AR, MILD MR, MILD PR, MILD TR  ESTIMATED RVSP: 34 mmHg (Normal)  NO VALVULAR STENOSIS   ECHO 05/2021: SEVERE LV SYSTOLIC DYSFUNCTION (See above)  MODERATE RV SYSTOLIC DYSFUNCTION (See above)  MODERATE VALVULAR REGURGITATION (See above)  NO VALVULAR STENOSIS  ESTIMATED LVEF 20%  Aortic: NORMAL GRADIENTS  Mitral: MILD - MODERATE MR  Tricuspid: MODERATE TR (3.63m/s)  Pulmonic: MODERATE PI  MODERATE LAE  MILD RAE  MILD LVE  MODERATE RVE  MODERATELY DILATED MAIN PULMONARY ARTERY MEASURING 2.6cm   TELEMETRY (personally reviewed): sinus rhythm rate 80s  EKG 07/17/2022 (personally reviewed): NSR rate 100 bpm  Data reviewed by me 07/13/2024: last 24h vitals tele labs imaging I/O vascular notes  Principal Problem:   Atherosclerosis of artery of extremity with rest pain [facility])    ASSESSMENT AND PLAN:  HEVER CASTILLEJA is a 64 y.o. male  with a past medical history of chronic systolic CHF with apical akinesis with an EF of 20%, coronary artery disease with STEMI in 2017 with PCI and stent to LAD, paroxysmal atrial fibrillation, NSVT, apical mural thrombus, hypertension, hyperlipidemia who presented on 07/11/2024 for outpatient peripheral vascular catheterization. Found to have worsening of his PAD and needs bypass.  Cardiology was consulted for pre-operative evaluation.   # Preoperative cardiovascular evaluation # Peripheral vascular disease # Chronic HFrEF # Coronary artery disease # Hx apical mural thrombus Patient presented for outpatient peripheral vascular cath and was found to have worsening of PAD. Vascular surgery planning for bypass later this week. He has known hx of systolic heart failure which has been stable for many years. Also has hx of CAD with no symptoms of chest pain/angina now or at all recently.  -Given his cardiac history he will be at elevated but not prohibitive risk for proceeding with surgery. He is optimized from a cardiac perspective and no further  workup is indicated.  -Would recommend continuing home GDMT as able perioperatively. Home farxiga  and metoprolol  have been reordered.  -Home eliquis  currently held, recommend resuming when able post-operatively. -Continue aspirin  81 mg daily and atorvastatin  40 mg daily.   This patient's plan of care was discussed and created with Dr. Florencio and he is in agreement.  Signed: Danita Bloch, PA-C  07/13/2024, 10:36 AM [facility]      "

## 2024-07-13 NOTE — Op Note (Addendum)
 [facility]    OPERATIVE NOTE   PROCEDURE: left common femoral artery to tibioperoneal trunk/peroneal artery bypass with 6 mm Distaflo pTFE graft Left tibioperoneal trunk/ posterior tibial/ peroneal artery endarterectomy Left common femoral/SFA endarterectomy Separate and distinct left profunda femorus artery endarterectomy and patch angioplasty Placement of antibiotic beads in both wounds.  PRE-OPERATIVE DIAGNOSIS: Atherosclerotic Occlusive Disease with rest pain left leg  POST-OPERATIVE DIAGNOSIS:  Atherosclerotic Occlusive Disease with rest pain left leg  SURGEON: Selinda Gu, MD  ANESTHESIA: general  ESTIMATED BLOOD LOSS: 950 cc  FINDING(S): None   SPECIMEN(S):  left tibioperoneal trunk/peroneal/posterior tibial plaque Left common femoral and superficial femoral artery plaque and stent Left profunda femorus artery plaque  INDICATIONS:   Cameron [name] is a 64 y.o. male who presents with ischemia of the left leg with rest pain. The risk, benefits, and alternative for bypass operations were discussed with the patient.  The patient is aware the risks include but are not limited to: bleeding, infection, myocardial infarction, stroke, limb loss, nerve damage, need for additional procedures in the future, wound complications, and inability to complete the bypass.  The patient is voices understanding of these risks and agreed to proceed.  DESCRIPTION: After informed consent was obtained, the patient was brought back to the operating room and placed in the supine position.  Prior to induction, the patient was given intravenous antibiotics.  After general anesthesia is induced, the patient was prepped and draped in the standard fashion for a femoral to popliteal bypass operation.  Appropriate timeout is called.    Attention was turned to the left groin.  A longitudinal incision was made over the left common femoral artery.  Using blunt dissection and electrocautery, the  artery was dissected circumferentially from the inguinal ligament down to the femoral bifurcation.  The superficial femoral artery, profunda femoral artery, and distal external iliac artery are looped with Silastic vessel loops.  We went down to the first three branches of the profunda femorus artery and controlled these. Circumflex branches were also dissected and controlled with vessel loops as needed.     Attention was then turned to the medial calf.  An longitudinal incision was made over the medial calf.  Using blunt dissection and electrocautery, a plane was developed through the subcutaneous tissue and fascia down to the distal popliteal and tibial origins.  The below knee popliteal artery and tibial trifurcation was was identified. Femoral condyles were noted to allow for tunneling.   The medial incision was extended distally then made in the mid calf and the tibioperoneal trunk, posterior tibial artery, and peroneal artery was identified.They were dissected circumferentially and looped with Silastic vessel loops. This was a tedious dissection and the vessels were extremely calcified. They would require an endarterectomy to allow an acceptable distal target. A tunneler was then passed and a Bard distal flow graft was then pulled through the tunnel. The distal end was approximated to the tibial artery.  The patient was given 6000 units of Heparin  intravenously, and this is allowed to circulate for proximally 5 minutes.  Attention was then turned to the tibial target site.  Arteriotomy was made with a 11-blade and extended with Potts scissors.  This was in the tibioperoneal trunk and taken down to the most proximal portion of the peroneal artery.  The vessel was extremely calcified and not suitable for anastomosis initially and the posterior tibial and peroneal arteries were also heavily diseased.  I began by using the Therapist, nutritional and  creating a plane in the tibioperoneal trunk and then extending  this down into the proximal peroneal artery and an extensive endarterectomy was performed.  I also used a freer elevator and then a small hemostat and performed a fairly extensive eversion endarterectomy on the posterior tibial artery over its first 3 to 4 cm.  Further eversion endarterectomy was also performed on the peroneal artery as far distal as I could obtain.  The plaque was sent off as a specimen.  I then turned my attention to the distal anastomosis with the distal flow graft.  The distal end of the conduit was spatulated to the appropriate length.  The vein was then sewn to the to the artery in an end-to-side configuration with a running stitch of CV 6.  Prior to completing the anastomosis, flushing maneuvers were performed and then flow was established distally.  After inspecting the anastomosis for hemostasis, Surgicel was placed around the suture line.   The external iliac artery, superficial femoral artery, and profunda femoral artery were then clamped along with any circumflex branches. The leg was straightened and final measurements were made. Arteriotomy is then made in the common femoral artery with an 11 blade and extended with Potts scissors. Arteriotomy is extended into the proximal superficial femoral artery. The profunda femoris artery artery was found to be severely atherosclerotic with a large amount of plaque present that could not be addressed in an eversion fashion through the common femoral arteriotomy and would require a separate distinct arteriotomy and endarterectomy for the profunda femoris artery..    Endarterectomy was then performed under direct visualization beginning in the proximal common artery and extending down to the proximal superficial femoral artery where there were previously placed stents.  The stents had created extensive hyperplasia and I removed 2 Viabahn stents from the superficial femoral artery to try to remove the hyperplastic response and ensure that we  would not impair any blood flow to the profunda femoris artery that would be addressed with a separate endarterectomy.  This was required to be able to sew the proximal bypass anastomosis..    The proximal extent of the bypass vein was trimmed to the appropriate shape. The leg was straightened and final measurements were made. The PTFE graft was sewn to the common femoral artery in an end-to-side configuration with a running stitch of Gore CV 6 suture.  Prior to completing the suture line the anastomosis is flushed and subsequently the suture line is completed. Flow was then reestablished to the profunda femoris artery.  The anastomosis is checked for leaks and the pTFE graft is clamped proximally.  It was then allowed to flow distally which allowed us  to interrogate the distal bypass anastomosis.  6-0 Prolene patch sutures were used with pledgets both at the proximal and distal anastomosis and hemostasis was achieved.  I then turned my attention to the profunda femoris artery.  Proximal control was gained in the common femoral artery and the distal profunda femoris branches were controlled with Vesseloops and the main profunda femoris was controlled with a profunda clamp.  An arteriotomy was created with an 11 blade and extended with Potts scissors in the profunda femoris artery.  A significant amount of atherosclerotic plaque was then removed from the profunda femoris artery all the way up to its origin.  This was out beyond the primary branches as well.  The wound was irrigated and then a bovine pericardial patch was cut and beveled to fit the wound.  6-0 Prolene sutures  were started both the proximal and distal endpoints of the profunda femoris artery patch angioplasty site.  The sutures were run medially and laterally to the midportion and the suture line was completed after flushing maneuvers.  Two 6-0 Prolene patch sutures were used for hemostasis and there was an excellent pulse in the profunda femoris  artery after endarterectomy. There was also an excellent pulse in the bypass graft and in the peroneal artery just distal to the bypass graft.  The foot also became pink and moisture was seen in the isolation bag on the foot.  The wounds were then irrigated with Vashe irrigation.  The wounds were then inspected for hemostasis. Bleeding points were controlled with electrocautery, and suture repair of active bleeding points.  Fibrillar and Hemoblast were then placed in the bed of the wounds and over each anastomosis. Once hemostasis was achieved. Gentamicin  and vancomycin  impregnated beads were then placed in both wounds. The groin incision was then closed in multiple layers using both 2-0 and 3-0 Vicryl in interrupted and running fashion. Skin was closed with a staples. The medial calf wound was then closed layers using  3-0 Vicryl, and staples. Celerate was used in both wounds to augment healing and wound closure.  A sterile dressing was applied to each incision.   COMPLICATIONS: None  CONDITION: Stable  Selinda Gu, M.D. [place] [facility] Office: [phone]  07/13/2024, 5:58 PM

## 2024-07-13 NOTE — Transfer of Care (Signed)
 Immediate Anesthesia Transfer of Care Note  Patient: Cameron [name]  Procedure(s) Performed: ENDARTERECTOMY, FEMORAL (Left: Groin) APPLICATION OF CELL SAVER THROMBOENDARTERECTOMY, TIBIOPERONEAL TRUNK (Leg Lower) CREATION, BYPASS, ARTERIAL, FEMORAL TO TIBIAL, USING GRAFT (Left: Leg Upper)  Patient Location: PACU  Anesthesia Type:General  Level of Consciousness: awake, alert , and oriented  Airway & Oxygen Therapy: Patient Spontanous Breathing and Patient connected to face mask oxygen  Post-op Assessment: Report given to RN and Post -op Vital signs reviewed and stable  Post vital signs: Reviewed and stable  Last Vitals:  Vitals Value Taken Time  BP 123/84 07/13/24 17:49  Temp    Pulse 87 07/13/24 17:53  Resp 11 07/13/24 17:53  SpO2 100 % 07/13/24 17:53  Vitals shown include unfiled device data.  Last Pain:  Vitals:   07/13/24 1055  TempSrc: Temporal  PainSc: 4       Patients Stated Pain Goal: 2 (07/11/24 1747)  Complications: No notable events documented.

## 2024-07-13 NOTE — Interval H&P Note (Signed)
 History and Physical Interval Note:  07/13/2024 12:12 PM  Cameron [name]  has presented today for surgery, with the diagnosis of artherosclerosis of artery.  The various methods of treatment have been discussed with the patient and family. After consideration of risks, benefits and other options for treatment, the patient has consented to  Procedures: ENDARTERECTOMY, FEMORAL (Left) BYPASS GRAFT FEMORAL-POPLITEAL ARTERY (Left) APPLICATION OF CELL SAVER (N/A) as a surgical intervention.  The patient's history has been reviewed, patient examined, no change in status, stable for surgery.  I have reviewed the patient's chart and labs.  Questions were answered to the patient's satisfaction.     Chilton Sallade

## 2024-07-13 NOTE — Anesthesia Procedure Notes (Signed)
 Procedure Name: Intubation Date/Time: 07/13/2024 12:47 PM  Performed by: Jestine Palma, CRNAPre-anesthesia Checklist: Patient identified, Emergency Drugs available, Suction available and Patient being monitored Patient Re-evaluated:Patient Re-evaluated prior to induction Oxygen Delivery Method: Circle system utilized Preoxygenation: Pre-oxygenation with 100% oxygen Induction Type: IV induction Ventilation: Mask ventilation without difficulty Laryngoscope Size: Miller and 2 Grade View: Grade I Tube type: Oral Tube size: 7.5 mm Number of attempts: 1 Airway Equipment and Method: Stylet and Oral airway Placement Confirmation: ETT inserted through vocal cords under direct vision, positive ETCO2 and breath sounds checked- equal and bilateral Secured at: 23 cm Tube secured with: Tape Dental Injury: Teeth and Oropharynx as per pre-operative assessment  Comments: Atraumatic intubation

## 2024-07-13 NOTE — Anesthesia Procedure Notes (Signed)
 Arterial Line Insertion Start/End1/01/2025 12:57 PM Performed by: Vicci Camellia Glatter, MD, anesthesiologist  Patient location: Pre-op. Preanesthetic checklist: patient identified, IV checked, site marked, risks and benefits discussed, surgical consent, monitors and equipment checked, pre-op evaluation, timeout performed and anesthesia consent Left, radial was placed Catheter size: 20 G Hand hygiene performed  and maximum sterile barriers used   Attempts: 1 Procedure performed using ultrasound to evaluate access site. Ultrasound Notes:relevant anatomy identified, ultrasound used to visualize needle entry and vessel patent under ultrasound. Following insertion, dressing applied. Post procedure assessment: normal and unchanged  Patient tolerated the procedure well with no immediate complications.

## 2024-07-13 NOTE — OR Nursing (Signed)
 At the end of the procedure, it was noted that the patient had blood tinged urine in both his nephrostomy bag and his indwelling catheter. MD made aware.

## 2024-07-14 ENCOUNTER — Encounter: Payer: Self-pay | Admitting: Vascular Surgery

## 2024-07-14 LAB — BASIC METABOLIC PANEL WITH GFR
Anion gap: 9 (ref 5–15)
BUN: 18 mg/dL (ref 8–23)
CO2: 22 mmol/L (ref 22–32)
Calcium: 8.6 mg/dL — ABNORMAL LOW (ref 8.9–10.3)
Chloride: 106 mmol/L (ref 98–111)
Creatinine, Ser: 1.04 mg/dL (ref 0.61–1.24)
GFR, Estimated: >60 mL/min
Glucose, Bld: 89 mg/dL (ref 70–99)
Potassium: 4.3 mmol/L (ref 3.5–5.1)
Sodium: 137 mmol/L (ref 135–145)

## 2024-07-14 LAB — CBC
HCT: 37 % — ABNORMAL LOW (ref 39.0–52.0)
Hemoglobin: 11.9 g/dL — ABNORMAL LOW (ref 13.0–17.0)
MCH: 30.7 pg (ref 26.0–34.0)
MCHC: 32.2 g/dL (ref 30.0–36.0)
MCV: 95.4 fL (ref 80.0–100.0)
Platelets: 90 K/uL — ABNORMAL LOW (ref 150–400)
RBC: 3.88 MIL/uL — ABNORMAL LOW (ref 4.22–5.81)
RDW: 16.3 % — ABNORMAL HIGH (ref 11.5–15.5)
WBC: 8.5 K/uL (ref 4.0–10.5)
nRBC: 0 % (ref 0.0–0.2)

## 2024-07-14 LAB — BPAM RBC
Blood Product Expiration Date: 202602092359
Blood Product Expiration Date: 202602092359
Unit Type and Rh: 5100
Unit Type and Rh: 5100

## 2024-07-14 LAB — TYPE AND SCREEN
ABO/RH(D): O POS
Antibody Screen: NEGATIVE
Unit division: 0
Unit division: 0

## 2024-07-14 LAB — PREPARE RBC (CROSSMATCH)

## 2024-07-14 MED ORDER — SODIUM CHLORIDE 0.9 % IV BOLUS
1000.0000 mL | Freq: Once | INTRAVENOUS | Status: AC
  Administered 2024-07-14: 1000 mL via INTRAVENOUS

## 2024-07-14 MED ORDER — SODIUM CHLORIDE 0.9 % IV BOLUS
500.0000 mL | Freq: Once | INTRAVENOUS | Status: AC
  Administered 2024-07-14: 500 mL via INTRAVENOUS

## 2024-07-14 MED ORDER — ATORVASTATIN CALCIUM 20 MG PO TABS
80.0000 mg | ORAL_TABLET | Freq: Every day | ORAL | Status: DC
  Administered 2024-07-14 – 2024-07-20 (×7): 80 mg via ORAL
  Filled 2024-07-14 (×6): qty 4
  Filled 2024-07-14: qty 1

## 2024-07-14 MED ORDER — CHLORHEXIDINE GLUCONATE CLOTH 2 % EX PADS: 6.0000 | MEDICATED_PAD | Freq: Every day | CUTANEOUS | Status: DC

## 2024-07-14 MED ORDER — EZETIMIBE 10 MG PO TABS
10.0000 mg | ORAL_TABLET | Freq: Every day | ORAL | Status: DC
  Administered 2024-07-14 – 2024-07-20 (×7): 10 mg via ORAL
  Filled 2024-07-14 (×8): qty 1

## 2024-07-14 NOTE — Plan of Care (Signed)
" °  Problem: Education: Goal: Knowledge of General Education information will improve Description: Including pain rating scale, medication(s)/side effects and non-pharmacologic comfort measures Outcome: Progressing   Problem: Health Behavior/Discharge Planning: Goal: Ability to manage health-related needs will improve Outcome: Progressing   Problem: Clinical Measurements: Goal: Ability to maintain clinical measurements within normal limits will improve Outcome: Progressing Goal: Will remain free from infection Outcome: Progressing Goal: Diagnostic test results will improve Outcome: Progressing Goal: Cardiovascular complication will be avoided Outcome: Progressing   Problem: Activity: Goal: Risk for activity intolerance will decrease Outcome: Progressing   Problem: Nutrition: Goal: Adequate nutrition will be maintained Outcome: Progressing   Problem: Coping: Goal: Level of anxiety will decrease Outcome: Progressing   Problem: Clinical Measurements: Goal: Respiratory complications will improve Outcome: Not Applicable   "

## 2024-07-14 NOTE — Progress Notes (Signed)
 [facility]  Daily Progress Note   Subjective  -   Doing well.  Appropriate postoperative pain.  Has some numbness on the inner thigh as well as the inner lower leg.  The left foot has sensation for the first time in quite some time.  His foot is no longer numb and painful after revascularization.  The foot is warm.  He has worked with physical therapy this morning.  No major events overnight  Objective Vitals:   07/14/24 0900 07/14/24 1000 07/14/24 1100 07/14/24 1200  BP: 105/63 (!) 79/56 (!) 86/60 120/70  Pulse: 64 93 100 75  Resp: 11 13 13 13   Temp:      TempSrc:      SpO2: 98% 94% 97% 97%  Weight:      Height:        Intake/Output Summary (Last 24 hours) at 07/14/2024 1212 Last data filed at 07/14/2024 1100 Gross per 24 hour  Intake 5815.88 ml  Output 3605 ml  Net 2210.88 ml    PULM  CTAB CV  RRR VASC  weakly palpable left posterior tibial pulse.  Left foot is warm with good capillary refill.  Femoral and calf incisions are clean, dry, and intact.  Laboratory CBC    Component Value Date/Time   WBC 8.5 07/14/2024 0500   HGB 11.9 (L) 07/14/2024 0500   HGB 10.6 (L) 03/26/2014 1232   HCT 37.0 (L) 07/14/2024 0500   HCT 32.1 (L) 03/26/2014 1232   PLT 90 (L) 07/14/2024 0500   PLT 137 (L) 03/26/2014 1232    BMET    Component Value Date/Time   NA 137 07/14/2024 0500   NA 139 03/28/2014 0823   K 4.3 07/14/2024 0500   K 3.4 (L) 03/28/2014 0823   CL 106 07/14/2024 0500   CL 111 (H) 03/28/2014 0823   CO2 22 07/14/2024 0500   CO2 22 03/28/2014 0823   GLUCOSE 89 07/14/2024 0500   GLUCOSE 106 (H) 03/28/2014 0823   BUN 18 07/14/2024 0500   BUN 8 03/28/2014 0823   CREATININE 1.04 07/14/2024 0500   CREATININE 0.97 03/28/2014 0823   CALCIUM  8.6 (L) 07/14/2024 0500   CALCIUM  7.8 (L) 03/28/2014 0823   GFRNONAA >60 07/14/2024 0500   GFRNONAA >60 03/28/2014 0823   GFRAA >60 02/21/2020 0630   GFRAA >60 03/28/2014 9176    Assessment/Planning: POD  #1 s/p left femoral to distal bypass, left tibial endarterectomy, common femoral/SFA endarterectomy, and separate and distinct profunda femoris endarterectomy  Doing well.  Foot is warm with sensation returned. Posterior tibial pulses weakly palpable but his peroneal is his dominant runoff Okay to move to the floor Appreciate physical therapy and we will try to increase his activity over the next several days Dual antiplatelet therapy planned going forward   Selinda Gu  07/14/2024, 12:12 PM

## 2024-07-14 NOTE — Progress Notes (Signed)
 " [facility] CLINIC CARDIOLOGY PROGRESS NOTE       Patient ID: LEVII HAIRFIELD MRN: [id number] DOB/AGE: 63/08/1960 63 y.o.  Admit date: 07/11/2024 Referring Physician Dr. Selinda Gu Primary Physician Lars Rea BROCKS, NP  Primary Cardiologist Dr. Dewane Reason for Consultation POC  HPI: Cameron [name] is a 64 y.o. male  with a past medical history of chronic systolic CHF with apical akinesis with an EF of 20%, coronary artery disease with STEMI in 2017 with PCI and stent to LAD, paroxysmal atrial fibrillation, NSVT, apical mural thrombus, hypertension, hyperlipidemia who presented on 07/11/2024 for outpatient peripheral vascular catheterization. Found to have worsening of his PAD and needs bypass.  Cardiology was consulted for pre-operative evaluation.   Interval history: -Patient seen and examined this AM, resting in hospital bed. -States he feels well overall today with no complaints of CP or SOB. Endorses some groin and calf pain post-surgery. -BP and HR remain stable, no events noted on tele. BP remains stable.   Review of systems complete and found to be negative unless listed above    Past Medical History:  Diagnosis Date   Arthritis    CHF (congestive heart failure) (HCC)    Coronary artery disease    a. 12/17 PCI with DES to pLAD with resdiual disease (RX therapy)   Depression    Global hypokinesis of left ventricle    Gout    Grade II diastolic dysfunction    History of kidney stones    History of percutaneous coronary intervention 2017   Hyperlipidemia    Hypertension    Left atrial dilation    Neuromuscular disorder (HCC)    leg numb/ can walk by self   PAD (peripheral artery disease)    2 stents in left leg   Right atrial dilation    S/P drug eluting coronary stent placement 2017   STEMI (ST elevation myocardial infarction) (HCC) 2017   Substance abuse (HCC)    alcohol and marijuana    Past Surgical History:  Procedure Laterality Date   CARDIAC  CATHETERIZATION N/A 06/17/2016   Procedure: Left Heart Cath and Coronary Angiography;  Surgeon: Dorn JINNY Lesches, MD;  Location: [facility] CV [facility];  Service: Cardiovascular;  Laterality: N/A;   CARDIAC CATHETERIZATION N/A 06/17/2016   Procedure: Coronary Stent Intervention;  Surgeon: Dorn JINNY Lesches, MD;  Location: [facility] CV [facility];  Service: Cardiovascular;  Laterality: N/A;   cardiac stents     CATARACT EXTRACTION W/PHACO Right 05/30/2024   Procedure: PHACOEMULSIFICATION, CATARACT, WITH IOL INSERTION 2.49 00:23.5;  Surgeon: Myrna Adine Anes, MD;  Location: [facility];  Service: Ophthalmology;  Laterality: Right;   CATARACT EXTRACTION W/PHACO Left 06/13/2024   Procedure: PHACOEMULSIFICATION, CATARACT, WITH IOL INSERTION 2.52 00:27.8;  Surgeon: Myrna Adine Anes, MD;  Location: [facility];  Service: Ophthalmology;  Laterality: Left;   CERVICAL SPINE SURGERY     IR NEPHROSTOMY PLACEMENT LEFT  07/12/2024   LOWER EXTREMITY ANGIOGRAPHY Left 06/25/2020   Procedure: LOWER EXTREMITY ANGIOGRAPHY;  Surgeon: Gu Selinda RAMAN, MD;  Location: [facility] INVASIVE CV LAB;  Service: Cardiovascular;  Laterality: Left;   LOWER EXTREMITY ANGIOGRAPHY Left 03/11/2023   Procedure: Lower Extremity Angiography;  Surgeon: Gu Selinda RAMAN, MD;  Location: [facility] INVASIVE CV LAB;  Service: Cardiovascular;  Laterality: Left;   LOWER EXTREMITY INTERVENTION Left 07/11/2024   Procedure: Lower Extremity Angiography;  Surgeon: Gu Selinda RAMAN, MD;  Location: [facility] INVASIVE CV LAB;  Service: Cardiovascular;  Laterality: Left;    Medications  Prior to Admission  Medication Sig Dispense Refill Last Dose/Taking   acetaminophen  (TYLENOL ) 325 MG tablet Take 2 tablets (650 mg total) by mouth every 6 (six) hours as needed for moderate pain, mild pain, fever or headache.   Unknown   allopurinol  (ZYLOPRIM ) 300 MG tablet Take 300 mg by mouth daily.   07/11/2024 Morning   aspirin  EC 81 MG tablet Take 1 tablet (81 mg total) by mouth daily.  Swallow whole. 30 tablet 12 07/10/2024   atorvastatin  (LIPITOR ) 80 MG tablet Take 1 tablet (80 mg total) by mouth daily. (Patient taking differently: Take 40 mg by mouth daily.)   07/11/2024 Morning   colchicine  0.6 MG tablet Take 0.6 mg by mouth.   Past Week   dapagliflozin  propanediol (FARXIGA ) 10 MG TABS tablet Take 10 mg by mouth daily.   07/11/2024 Morning   ELIQUIS  5 MG TABS tablet Take 1 tablet by mouth 2 (two) times daily.   07/10/2024   furosemide  (LASIX ) 40 MG tablet Take 40 mg by mouth 2 (two) times daily.   07/11/2024 Morning   gabapentin  (NEURONTIN ) 300 MG capsule Take 300 mg by mouth 3 (three) times daily.   07/10/2024   ibuprofen (ADVIL) 200 MG tablet Take 200 mg by mouth every 6 (six) hours as needed for headache or mild pain.   Unknown   metoprolol  succinate (TOPROL -XL) 25 MG 24 hr tablet Take 12.5 mg by mouth.   07/11/2024 Morning   nicotine  (NICODERM CQ  - DOSED IN MG/24 HOURS) 14 mg/24hr patch One 14 mg patch chest wall daily as needed for nicotine  craving.  (Can substitute generic) 28 patch 0 Unknown   nitroGLYCERIN  (NITROSTAT ) 0.4 MG SL tablet Place under the tongue.   Unknown   Polyethylene Glycol 4500 POWD 1 application  by Does not apply route as needed.   Unknown   sertraline (ZOLOFT) 100 MG tablet Take 100 mg by mouth daily.   07/11/2024   spironolactone  (ALDACTONE ) 25 MG tablet Take 25 mg by mouth 2 (two) times daily.   07/11/2024 Morning   collagenase  (SANTYL ) 250 UNIT/GM ointment Apply topically.      hydrocortisone  (ANUSOL -HC) 25 MG suppository One suppository twice a day as needed for hemorrhoids.  (Generic okay to substitute) (Patient not taking: Reported on 07/12/2024) 12 suppository 0 Not Taking   oxyCODONE  (OXY IR/ROXICODONE ) 5 MG immediate release tablet Take 1 tablet (5 mg total) by mouth every 8 (eight) hours as needed for severe pain (pain score 7-10). (Patient not taking: Reported on 07/11/2024) 20 tablet 0 Not Taking   pantoprazole  (PROTONIX ) 40 MG tablet Take 1 tablet (40 mg  total) by mouth daily. 30 tablet 0    predniSONE  (DELTASONE ) 20 MG tablet Take 20 mg by mouth 2 (two) times daily. (Patient not taking: Reported on 06/22/2024)   Not Taking   sertraline (ZOLOFT) 50 MG tablet Take 50 mg by mouth daily. (Patient not taking: Reported on 07/11/2024)   Not Taking   Social History   Socioeconomic History   Marital status: Divorced    Spouse name: Not on file   Number of children: Not on file   Years of education: Not on file   Highest education level: Not on file  Occupational History   Not on file  Tobacco Use   Smoking status: Every Day    Current packs/day: 1.00    Types: Cigarettes   Smokeless tobacco: Never  Vaping Use   Vaping status: Never Used  Substance and Sexual Activity  Alcohol use: Not Currently   Drug use: Yes    Frequency: 1.0 times per week    Types: Marijuana    Comment: twice monthly   Sexual activity: Not Currently  Other Topics Concern   Not on file  Social History Narrative   Not on file   Social Drivers of Health   Tobacco Use: High Risk (07/13/2024)   Patient History    Smoking Tobacco Use: Every Day    Smokeless Tobacco Use: Never    Passive Exposure: Not on file  Financial Resource Strain: Low Risk  (03/24/2024)   Received from [facility]   Overall Financial Resource Strain (CARDIA)    Difficulty of Paying Living Expenses: Not hard at all  Food Insecurity: Food Insecurity Present (07/11/2024)   Epic    Worried About Programme Researcher, Broadcasting/film/video in the Last Year: Often true    Ran Out of Food in the Last Year: Never true  Transportation Needs: No Transportation Needs (07/11/2024)   Epic    Lack of Transportation (Medical): No    Lack of Transportation (Non-Medical): No  Physical Activity: Not on file  Stress: Not on file  Social Connections: Socially Isolated (07/11/2024)   Social Connection and Isolation Panel    Frequency of Communication with Friends and Family: Never    Frequency of Social Gatherings  with Friends and Family: Never    Attends Religious Services: Never    Database Administrator or Organizations: No    Attends Banker Meetings: Never    Marital Status: Divorced  Catering Manager Violence: Not At Risk (07/11/2024)   Epic    Fear of Current or Ex-Partner: No    Emotionally Abused: No    Physically Abused: No    Sexually Abused: No  Depression (PHQ2-9): Not on file  Alcohol Screen: Not on file  Housing: Low Risk (07/11/2024)   Epic    Unable to Pay for Housing in the Last Year: No    Number of Times Moved in the Last Year: 1    Homeless in the Last Year: No  Utilities: At Risk (07/11/2024)   Epic    Threatened with loss of utilities: Yes  Health Literacy: Not on file    Family History  Problem Relation Age of Onset   CAD Paternal Grandfather    Breast cancer Mother    Parkinson's disease Father    Alzheimer's disease Father    Hypertension Father    Diabetes Father      Vitals:   07/14/24 0530 07/14/24 0600 07/14/24 0720 07/14/24 0730  BP:  (!) 86/66 94/65   Pulse:  83 84 86  Resp: 17 12 12 16   Temp:      TempSrc:      SpO2:  98% 98% 99%  Weight:      Height:        PHYSICAL EXAM General: Well appearing male, well nourished, in no acute distress. HEENT: Normocephalic and atraumatic. Neck: No JVD.  Lungs: Normal respiratory effort on room air. Clear bilaterally to auscultation. No wheezes, crackles, rhonchi.  Heart: HRRR. Normal S1 and S2 without gallops or murmurs.  Abdomen: Non-distended appearing.  Msk: Normal strength and tone for age. Extremities: Warm and well perfused. No clubbing, cyanosis. No edema.  Neuro: Alert and oriented X 3. Psych: Answers questions appropriately.   Labs: Basic Metabolic Panel: Recent Labs    07/12/24 2349 07/14/24 0500  NA 137 137  K 4.1 4.3  CL 102 106  CO2 24 22  GLUCOSE 101* 89  BUN 23 18  CREATININE 1.44* 1.04  CALCIUM  9.1 8.6*   Liver Function Tests: Recent Labs    07/11/24 1744  AST  22  ALT 12  ALKPHOS 121  BILITOT 0.4  PROT 7.0  ALBUMIN  4.1   No results for input(s): LIPASE, AMYLASE in the last 72 hours. CBC: Recent Labs    07/12/24 2349 07/14/24 0500  WBC 8.3 8.5  HGB 13.5 11.9*  HCT 42.2 37.0*  MCV 95.0 95.4  PLT 116* 90*   Cardiac Enzymes: No results for input(s): CKTOTAL, CKMB, CKMBINDEX, TROPONINIHS in the last 72 hours. BNP: No results for input(s): BNP in the last 72 hours. D-Dimer: No results for input(s): DDIMER in the last 72 hours. Hemoglobin A1C: No results for input(s): HGBA1C in the last 72 hours. Fasting Lipid Panel: No results for input(s): CHOL, HDL, LDLCALC, TRIG, CHOLHDL, LDLDIRECT in the last 72 hours. Thyroid  Function Tests: No results for input(s): TSH, T4TOTAL, T3FREE, THYROIDAB in the last 72 hours.  Invalid input(s): FREET3 Anemia Panel: No results for input(s): VITAMINB12, FOLATE, FERRITIN, TIBC, IRON, RETICCTPCT in the last 72 hours.   Radiology: IR NEPHROSTOMY PLACEMENT LEFT Result Date: 07/12/2024 INDICATION: 64 year old with left hydronephrosis secondary to a bladder mass. Request for a percutaneous nephrostomy tube placement. EXAM: PLACEMENT OF LEFT NEPHROSTOMY TUBE WITH ULTRASOUND AND FLUOROSCOPIC GUIDANCE COMPARISON:  CT abdomen pelvis 06/28/2024 MEDICATIONS: Rocephin  2 g; The antibiotic was administered in an appropriate time frame prior to skin puncture. ANESTHESIA/SEDATION: Moderate (conscious) sedation was employed during this procedure. A total of Versed  2 mg and Fentanyl  100 mcg was administered intravenously by the radiology nurse. Total intra-service moderate Sedation Time: 23 minutes. The patient's level of consciousness and vital signs were monitored continuously by radiology nursing throughout the procedure under my direct supervision. CONTRAST:  15 mL Omnipaque  300-administered into the collecting system(s) FLUOROSCOPY: Radiation Exposure Index (as provided by  the fluoroscopic device): 4 mGy Kerma COMPLICATIONS: None immediate. PROCEDURE: Informed written consent was obtained from the patient after a thorough discussion of the procedural risks, benefits and alternatives. All questions were addressed. Maximal Sterile Barrier Technique was utilized including caps, mask, sterile gowns, sterile gloves, sterile drape, hand hygiene and skin antiseptic. A timeout was performed prior to the initiation of the procedure. Patient was placed prone. Left flank was prepped and draped in sterile fashion. Ultrasound was used to identify the left kidney. Ultrasound image was saved for documentation. Skin was anesthetized with 1% lidocaine . Using ultrasound guidance, a 22 gauge needle was directed into a mildly dilated lower pole calyx. Contrast injection confirmed placement in the renal collecting system. 0.018 wire was advanced into the renal collecting system. Accustick dilator set was placed. The tract was dilated over a superstiff Amplatz wire and a 10 French multipurpose drain was reconstituted in the renal pelvis. Contrast injection confirmed placement in the renal pelvis. Drain was sutured to skin and attached to a gravity bag. Fluoroscopic and ultrasound images were taken and saved for documentation. FINDINGS: Mild to moderate left hydronephrosis. Nephrostomy tube was placed via a lower pole calyx. Nephrostomy tube was reconstituted in the renal pelvis. IMPRESSION: Successful placement of a left percutaneous nephrostomy tube using ultrasound and fluoroscopic guidance. Electronically Signed   By: Juliene Balder M.D.   On: 07/12/2024 12:39   PERIPHERAL VASCULAR CATHETERIZATION Result Date: 07/11/2024 See surgical note for result.  CT HEMATURIA WORKUP Result Date: 07/02/2024 CLINICAL DATA:  Gross hematuria * Tracking  Code: BO * EXAM: CT ABDOMEN AND PELVIS WITHOUT AND WITH CONTRAST TECHNIQUE: Multidetector CT imaging of the abdomen and pelvis was performed following the standard  protocol before and following the bolus administration of intravenous contrast. RADIATION DOSE REDUCTION: This exam was performed according to the departmental dose-optimization program which includes automated exposure control, adjustment of the mA and/or kV according to patient size and/or use of iterative reconstruction technique. CONTRAST:  OMNIPAQUE  IOHEXOL  300 MG/ML  SOLN COMPARISON:  05/21/2024 FINDINGS: Lower chest: No acute abnormality.  Coronary artery calcifications. Hepatobiliary: No solid liver abnormality is seen. Cholecystectomy. No biliary ductal dilatation. Pancreas: Unremarkable. No pancreatic ductal dilatation or surrounding inflammatory changes. Spleen: Normal in size without significant abnormality. Adrenals/Urinary Tract: Adrenal glands are unremarkable. Mild left hydronephrosis and hydroureter. Small nonobstructive bilateral renal calculi and or renal vascular calcifications. Contrast enhancing endoluminal mass of the posterior left aspect of the bladder overlying and obstructing the ureterovesicular junction measuring 3.8 x 2.0 cm (series 8, image 74). Stomach/Bowel: Stomach is within normal limits. Appendix appears normal. Sigmoid diverticulosis. Mild, long segment wall thickening of the descending and sigmoid colon (series 8, image 68). Vascular/Lymphatic: Aortic atherosclerosis. Unchanged prominent retroperitoneal lymph nodes, index aortocaval node measuring up to 1.0 x 0.9 cm (series 8, image 37). Reproductive: No mass or other significant abnormality. Other: Small fat containing right inguinal hernia. Fat containing parastomal hernia in the left lower quadrant (series 8, image 57). No ascites. Musculoskeletal: No acute or significant osseous findings. IMPRESSION: 1. Contrast enhancing endoluminal mass of the posterior left aspect of the bladder overlying and obstructing the ureterovesicular junction measuring 3.8 x 2.0 cm. Findings are consistent with bladder malignancy. 2. Mild  left hydronephrosis and hydroureter. 3. Unchanged prominent retroperitoneal lymph nodes, index aortocaval node measuring up to 1.0 x 0.9 cm. No definite evidence of lymphadenopathy or metastatic disease in the abdomen or pelvis. Consider PET-CT for metabolic characterization. 4. Small nonobstructive bilateral renal calculi and/or renal vascular calcifications. 5. Sigmoid diverticulosis. Mild, long segment wall thickening of the descending and sigmoid colon, suggestive of colitis, possibly reflecting chronic sequelae of prior diverticulitis. Appearance is similar to prior examination. 6. Coronary artery disease. Aortic Atherosclerosis (ICD10-I70.0). Electronically Signed   By: Marolyn JONETTA Jaksch M.D.   On: 07/02/2024 07:15   VAS US  LOWER EXTREMITY ARTERIAL DUPLEX Result Date: 06/27/2024 LOWER EXTREMITY ARTERIAL DUPLEX STUDY Patient Name:  AVIGDOR DOLLAR  Date of Exam:   06/22/2024 Medical Rec #: [id number]          Accession #:    [id number] Date of Birth: [birth year]          Patient Gender: M Patient Age:   73 years Exam Location:  [facility] Procedure:      VAS US  LOWER EXTREMITY ARTERIAL DUPLEX Referring Phys: ORVIN DARING --------------------------------------------------------------------------------  Indications: Peripheral artery disease.  Vascular Interventions: 06/25/20: Left EIA & SFA/popliteal stents with TP trunk,                         peroneal & ATA angioplasties;                         03/11/23: Left SFA/popliteal thrombectomy/PTA/stent with                         TP trunk & peroneal angioplasties;SABRA Current ABI:            Right=0.93 & Left=0.60  Performing Technologist: Elsie Churn RT, RDMS, RVT  Examination Guidelines: A complete evaluation includes B-mode imaging, spectral Doppler, color Doppler, and power Doppler as needed of all accessible portions of each vessel. Bilateral testing is considered an integral part of a complete examination. Limited examinations for reoccurring  indications may be performed as noted  +--------------+--------+-----+--------+-------------------+--------------+ LEFT          PSV cm/sRatioStenosisWaveform           Comments       +--------------+--------+-----+--------+-------------------+--------------+ CFA Mid       36                   hyperemic                         +--------------+--------+-----+--------+-------------------+--------------+ DFA           124                  hyperemic                         +--------------+--------+-----+--------+-------------------+--------------+ SFA Prox                   occluded                                  +--------------+--------+-----+--------+-------------------+--------------+ SFA Mid                    occluded                                  +--------------+--------+-----+--------+-------------------+--------------+ SFA Distal                 occluded                                  +--------------+--------+-----+--------+-------------------+--------------+ POP Prox                   occluded                                  +--------------+--------+-----+--------+-------------------+--------------+ POP Distal                 occluded                                  +--------------+--------+-----+--------+-------------------+--------------+ ATA Distal    17                   dampened monophasic               +--------------+--------+-----+--------+-------------------+--------------+ PTA Mid/Distal             occluded                                  +--------------+--------+-----+--------+-------------------+--------------+ PTA Distal    10                   dampened monophasicvia collateral +--------------+--------+-----+--------+-------------------+--------------+ PERO Distal   25  dampened monophasic               +--------------+--------+-----+--------+-------------------+--------------+  Summary:  Left: Occlusion of the left SFA/popliteal artery stent and mid/distal PTA.  See table(s) above for measurements and observations. Electronically signed by Selinda Gu MD on 06/27/2024 at 7:54:41 AM.    Final    VAS US  ABI WITH/WO TBI Result Date: 06/27/2024  LOWER EXTREMITY DOPPLER STUDY Patient Name:  AMADOU KATZENSTEIN  Date of Exam:   06/22/2024 Medical Rec #: [id number]          Accession #:    [id number] Date of Birth: 1961-04-28          Patient Gender: M Patient Age:   62 years Exam Location:  [facility] Procedure:      VAS US  ABI WITH/WO TBI Referring Phys: SELINDA DEW --------------------------------------------------------------------------------  Indications: Peripheral artery disease.  Vascular Interventions: 06/25/20: Left EIA & SFA/popliteal stents with TP trunk,                         peroneal & ATA angioplasties;                         03/11/23: Left SFA/popliteal thrombectomy/PTA/stent with                         TP trunk & peroneal angioplasties;SABRA Performing Technologist: Elsie Churn RT, RDMS, RVT  Examination Guidelines: A complete evaluation includes at minimum, Doppler waveform signals and systolic blood pressure reading at the level of bilateral brachial, anterior tibial, and posterior tibial arteries, when vessel segments are accessible. Bilateral testing is considered an integral part of a complete examination. Photoelectric Plethysmograph (PPG) waveforms and toe systolic pressure readings are included as required and additional duplex testing as needed. Limited examinations for reoccurring indications may be performed as noted.  ABI Findings: +---------+------------------+-----+--------+-------+ Right    Rt Pressure (mmHg)IndexWaveformComment +---------+------------------+-----+--------+-------+ Brachial 92                                     +---------+------------------+-----+--------+-------+ PTA      73                0.79 biphasic         +---------+------------------+-----+--------+-------+ DP       86                0.93 biphasic        +---------+------------------+-----+--------+-------+ Great Toe57                0.62 Abnormal        +---------+------------------+-----+--------+-------+ +---------+------------------+-----+-------------------+--------------+ Left     Lt Pressure (mmHg)IndexWaveform           Comment        +---------+------------------+-----+-------------------+--------------+ Brachial 89                                                       +---------+------------------+-----+-------------------+--------------+ PTA      55                0.60 dampened monophasicvia collateral +---------+------------------+-----+-------------------+--------------+ PERO     50  0.54 dampened monophasic               +---------+------------------+-----+-------------------+--------------+ DP       46                0.50 dampened monophasic               +---------+------------------+-----+-------------------+--------------+ Great Toe33                0.36 Abnormal                          +---------+------------------+-----+-------------------+--------------+ +-------+-----------+-----------+------------+------------+ ABI/TBIToday's ABIToday's TBIPrevious ABIPrevious TBI +-------+-----------+-----------+------------+------------+ Right  0.93       0.62       0.93        NA           +-------+-----------+-----------+------------+------------+ Left   0.60       0.36       0.51        NA           +-------+-----------+-----------+------------+------------+ TOES Findings: +----------+---------------+---------------+-------+ Right ToesPressure (mmHg)Waveform       Comment +----------+---------------+---------------+-------+ 1st Digit                Abnormal               +----------+---------------+---------------+-------+ 2nd Digit                mildly  abnormal        +----------+---------------+---------------+-------+ 3rd Digit                mildly abnormal        +----------+---------------+---------------+-------+ 4th Digit                mildly abnormal        +----------+---------------+---------------+-------+ 5th Digit                Normal                 +----------+---------------+---------------+-------+  +---------+---------------+-----------------+-------+ Left ToesPressure (mmHg)Waveform         Comment +---------+---------------+-----------------+-------+ 1st Digit               severely dampened        +---------+---------------+-----------------+-------+ 2nd Digit               severely dampened        +---------+---------------+-----------------+-------+ 3rd Digit               severely dampened        +---------+---------------+-----------------+-------+ 4th Digit               severely dampened        +---------+---------------+-----------------+-------+ 5th Digit               severely dampened        +---------+---------------+-----------------+-------+  Bilateral ABIs appear essentially unchanged compared to prior study on 03/10/23 done at [facility] prior to last intervention.  Summary: Right: Resting right ankle-brachial index indicates mild right lower extremity arterial disease. The right toe-brachial index is abnormal.  Left: Resting left ankle-brachial index indicates moderate left lower extremity arterial disease. The left toe-brachial index is abnormal.  *See table(s) above for measurements and observations.  Electronically signed by Selinda Gu MD on 06/27/2024 at 7:54:32 AM.    Final     ECHO 03/29/2024: SEVERE LEFT VENTRICULAR SYSTOLIC DYSFUNCTION WITH NO LVH  ESTIMATED EF: 20%  NORMAL LA PRESSURES WITH DIASTOLIC DYSFUNCTION (  GRADE 1)  NORMAL RIGHT VENTRICULAR SYSTOLIC FUNCTION  VALVULAR REGURGITATION: No AR, MILD MR, MILD PR, MILD TR  ESTIMATED RVSP: 34 mmHg (Normal)  NO VALVULAR  STENOSIS   ECHO 05/2021: SEVERE LV SYSTOLIC DYSFUNCTION (See above)  MODERATE RV SYSTOLIC DYSFUNCTION (See above)  MODERATE VALVULAR REGURGITATION (See above)  NO VALVULAR STENOSIS  ESTIMATED LVEF 20%  Aortic: NORMAL GRADIENTS  Mitral: MILD - MODERATE MR  Tricuspid: MODERATE TR (3.28m/s)  Pulmonic: MODERATE PI  MODERATE LAE  MILD RAE  MILD LVE  MODERATE RVE  MODERATELY DILATED MAIN PULMONARY ARTERY MEASURING 2.6cm   TELEMETRY (personally reviewed): sinus rhythm PACs rate 90-100s  EKG 07/17/2022 (personally reviewed): NSR rate 100 bpm  Data reviewed by me 07/14/2024: last 24h vitals tele labs imaging I/O vascular notes  Principal Problem:   Atherosclerosis of artery of extremity with rest pain [facility])    ASSESSMENT AND PLAN:  AYODELE SANGALANG is a 64 y.o. male  with a past medical history of chronic systolic CHF with apical akinesis with an EF of 20%, coronary artery disease with STEMI in 2017 with PCI and stent to LAD, paroxysmal atrial fibrillation, NSVT, apical mural thrombus, hypertension, hyperlipidemia who presented on 07/11/2024 for outpatient peripheral vascular catheterization. Found to have worsening of his PAD and needs bypass.  Cardiology was consulted for pre-operative evaluation.   # Preoperative cardiovascular evaluation # Peripheral vascular disease # Chronic HFrEF # Coronary artery disease # Hx apical mural thrombus Patient presented for outpatient peripheral vascular cath and was found to have worsening of PAD. Vascular surgery planning for bypass later this week. He has known hx of systolic heart failure which has been stable for many years. Also has hx of CAD with no symptoms of chest pain/angina now or at all recently. S/p LE bypass 07/13/2024. -Would recommend continuing home GDMT as able perioperatively. Home farxiga  and metoprolol  have been reordered.  -Home eliquis  currently held, recommend resuming when able post-operatively. -Continue aspirin  81 mg daily and  atorvastatin  40 mg daily.   This patient's plan of care was discussed and created with Dr. Florencio and he is in agreement.  Signed: Danita Bloch, PA-C  07/14/2024, 8:10 AM [facility]      "

## 2024-07-14 NOTE — Plan of Care (Signed)

## 2024-07-14 NOTE — Anesthesia Postprocedure Evaluation (Signed)
"   Anesthesia Post Note  Patient: Cameron [name]  Procedure(s) Performed: ENDARTERECTOMY, FEMORAL (Left: Groin) APPLICATION OF CELL SAVER THROMBOENDARTERECTOMY, TIBIOPERONEAL TRUNK (Leg Lower) CREATION, BYPASS, ARTERIAL, FEMORAL TO TIBIAL, USING GRAFT (Left: Leg Upper)  Patient location during evaluation: PACU Anesthesia Type: General Level of consciousness: awake and alert Pain management: pain level controlled Vital Signs Assessment: post-procedure vital signs reviewed and stable Respiratory status: spontaneous breathing, nonlabored ventilation, respiratory function stable and patient connected to nasal cannula oxygen Cardiovascular status: blood pressure returned to baseline and stable Postop Assessment: no apparent nausea or vomiting Anesthetic complications: no   No notable events documented.   Last Vitals:  Vitals:   07/14/24 0400 07/14/24 0430  BP: (!) 89/70   Pulse: 82 82  Resp: 18 (!) 9  Temp: 36.6 C   SpO2: 96% 100%    Last Pain:  Vitals:   07/14/24 0400  TempSrc: Oral  PainSc:                  Lynwood KANDICE Clause      "

## 2024-07-14 NOTE — Evaluation (Signed)
 Physical Therapy Evaluation Patient Details Name: Cameron [name] MRN: [id number] DOB: [birth year] Today's Date: 07/14/2024  History of Present Illness  64 y.o. male  with a past medical history of chronic systolic CHF with apical akinesis with an EF of 20%, coronary artery disease with STEMI in 2017 with PCI and stent to LAD, paroxysmal atrial fibrillation, NSVT, apical mural thrombus, hypertension, hyperlipidemia who presented on 07/11/2024 for outpatient peripheral vascular catheterization. Now s/p left femoral to distal bypass, left tibial endarterectomy, common femoral/SFA endarterectomy, and separate and distinct profunda femoris endarterectomy.  Clinical Impression  Pt pleasant and motivated for PT and despite pain he was eager to get out of bed and try to get moving.  Cleared for activity with nursing - BP has been low but asymptomatic and remains so t/o the session.  Pt had expected hesitancy with initial L LE WBing but did gradually increase WBing/heel flat and showed increased confidence with increased cuing/gait training.  Pt lives alone but is hoping to be able to go to a child's home at discharge.  Pt will benefit from continued PT to address functional limitations.        If plan is discharge home, recommend the following: A little help with walking and/or transfers;A little help with bathing/dressing/bathroom;Assistance with cooking/housework;Assist for transportation;Help with stairs or ramp for entrance   Can travel by private vehicle   Yes    Equipment Recommendations BSC/3in1 (has walker)  Recommendations for Other Services       Functional Status Assessment Patient has had a recent decline in their functional status and demonstrates the ability to make significant improvements in function in a reasonable and predictable amount of time.     Precautions / Restrictions Precautions Precautions: Fall Recall of Precautions/Restrictions: Intact Restrictions Weight Bearing  Restrictions Per Provider Order: No      Mobility  Bed Mobility Overal bed mobility: Modified Independent             General bed mobility comments: able to get himself up to sitting EOB w/o direct assist, did need bed rails and extra time    Transfers Overall transfer level: Needs assistance Equipment used: Rolling walker (2 wheels) Transfers: Sit to/from Stand Sit to Stand: Min assist           General transfer comment: able to    Ambulation/Gait Ambulation/Gait assistance: Min assist Gait Distance (Feet): 50 Feet Assistive device: Rolling walker (2 wheels)         General Gait Details: Pt with expected reliant on walker and initial hesitancy with L LE WBing but with cuing and easing into it he was able to get heel down and gradually take more weight through it during a solid bout of ambulation.  Stairs            Wheelchair Mobility     Tilt Bed    Modified Rankin (Stroke Patients Only)       Balance Overall balance assessment: Modified Independent                                           Pertinent Vitals/Pain Pain Assessment Pain Assessment: 0-10 Pain Score: 6  Pain Location: L LE (especially knee and calf) Pain Intervention(s): Limited activity within patient's tolerance    Home Living Family/patient expects to be discharged to:: Private residence Living Arrangements: Alone Available Help at Discharge: Available PRN/intermittently  Home Access: Stairs to enter Entrance Stairs-Rails: Can reach both Entrance Stairs-Number of Steps: 4     Home Equipment: [employer] (2 wheels) Additional Comments: Pt hoping to d/c to a child's home    Prior Function Prior Level of Function : Independent/Modified Independent             Mobility Comments: Pt reports only rarely needing walker during gout flares, otherwise active and independent ADLs Comments: Reports able to manage the home w/o assist      Extremity/Trunk Assessment   Upper Extremity Assessment Upper Extremity Assessment: Overall WFL for tasks assessed;Generalized weakness    Lower Extremity Assessment Lower Extremity Assessment: Overall WFL for tasks assessed (pain/swelling limited ROM in L knee/ankle, but functional in this limited range)       Communication   Communication Communication: No apparent difficulties    Cognition Arousal: Alert Behavior During Therapy: WFL for tasks assessed/performed   PT - Cognitive impairments: No apparent impairments                         Following commands: Intact       Cueing Cueing Techniques: Verbal cues     General Comments General comments (skin integrity, edema, etc.): pt with consistently low BPs (90/60s during session today) but no syncopal or other symptoms    Exercises     Assessment/Plan    PT Assessment Patient needs continued PT services  PT Problem List Decreased strength;Decreased range of motion;Decreased activity tolerance;Decreased balance;Decreased safety awareness;Decreased knowledge of use of DME;Decreased mobility;Pain       PT Treatment Interventions DME instruction;Gait training;Stair training;Functional mobility training;Therapeutic activities;Therapeutic exercise;Balance training;Patient/family education    PT Goals (Current goals can be found in the Care Plan section)  Acute Rehab PT Goals Patient Stated Goal: go home PT Goal Formulation: With patient Time For Goal Achievement: 07/27/24 Potential to Achieve Goals: Good    Frequency Min 2X/week     Co-evaluation               AM-PAC PT 6 Clicks Mobility  Outcome Measure Help needed turning from your back to your side while in a flat bed without using bedrails?: None Help needed moving from lying on your back to sitting on the side of a flat bed without using bedrails?: A Little Help needed moving to and from a bed to a chair (including a wheelchair)?: A  Little Help needed standing up from a chair using your arms (e.g., wheelchair or bedside chair)?: A Little Help needed to walk in hospital room?: A Lot Help needed climbing 3-5 steps with a railing? : A Lot 6 Click Score: 17    End of Session Equipment Utilized During Treatment: Gait belt Activity Tolerance: Patient tolerated treatment well;Patient limited by fatigue Patient left: with call bell/phone within reach;in bed Nurse Communication: Mobility status PT Visit Diagnosis: Muscle weakness (generalized) (M62.81);Difficulty in walking, not elsewhere classified (R26.2)    Time: 1000-1035 PT Time Calculation (min) (ACUTE ONLY): 35 min   Charges:   PT Evaluation $PT Eval Low Complexity: 1 Low PT Treatments $Gait Training: 8-22 mins PT General Charges $$ ACUTE PT VISIT: 1 Visit         Carmin JONELLE Deed, DPT 07/14/2024, 1:30 PM

## 2024-07-15 ENCOUNTER — Encounter: Payer: Self-pay | Admitting: Vascular Surgery

## 2024-07-15 LAB — BASIC METABOLIC PANEL WITH GFR
Anion gap: 9 (ref 5–15)
BUN: 17 mg/dL (ref 8–23)
CO2: 23 mmol/L (ref 22–32)
Calcium: 9 mg/dL (ref 8.9–10.3)
Chloride: 106 mmol/L (ref 98–111)
Creatinine, Ser: 1.23 mg/dL (ref 0.61–1.24)
GFR, Estimated: >60 mL/min
Glucose, Bld: 94 mg/dL (ref 70–99)
Potassium: 5.2 mmol/L — ABNORMAL HIGH (ref 3.5–5.1)
Sodium: 138 mmol/L (ref 135–145)

## 2024-07-15 LAB — SURGICAL PATHOLOGY

## 2024-07-15 LAB — CBC
HCT: 38.2 % — ABNORMAL LOW (ref 39.0–52.0)
Hemoglobin: 12.2 g/dL — ABNORMAL LOW (ref 13.0–17.0)
MCH: 30.6 pg (ref 26.0–34.0)
MCHC: 31.9 g/dL (ref 30.0–36.0)
MCV: 95.7 fL (ref 80.0–100.0)
Platelets: 96 K/uL — ABNORMAL LOW (ref 150–400)
RBC: 3.99 MIL/uL — ABNORMAL LOW (ref 4.22–5.81)
RDW: 16.4 % — ABNORMAL HIGH (ref 11.5–15.5)
WBC: 9.7 K/uL (ref 4.0–10.5)
nRBC: 0 % (ref 0.0–0.2)

## 2024-07-15 MED ORDER — APIXABAN 5 MG PO TABS
5.0000 mg | ORAL_TABLET | Freq: Two times a day (BID) | ORAL | Status: DC
  Administered 2024-07-15 – 2024-07-20 (×11): 5 mg via ORAL
  Filled 2024-07-15 (×11): qty 1

## 2024-07-15 NOTE — Progress Notes (Signed)
 " [facility] CLINIC CARDIOLOGY PROGRESS NOTE       Patient ID: Cameron [name] MRN: [id number] DOB/AGE: 11-May-1961 64 y.o.  Admit date: 07/11/2024 Referring Physician Dr. Selinda Gu Primary Physician Lars Rea BROCKS, NP  Primary Cardiologist Dr. Dewane Reason for Consultation POC  HPI: Cameron [name] is a 64 y.o. male  with a past medical history of chronic systolic CHF with apical akinesis with an EF of 20%, coronary artery disease with STEMI in 2017 with PCI and stent to LAD, paroxysmal atrial fibrillation, NSVT, apical mural thrombus, hypertension, hyperlipidemia who presented on 07/11/2024 for outpatient peripheral vascular catheterization. Found to have worsening of his PAD and needs bypass.  Cardiology was consulted for pre-operative evaluation.   Interval history: -Patient seen and examined this AM, sitting upright in bedside chair.  -States he feels well overall today with no complaints of CP or SOB. Continues to endorse some groin and calf pain post-surgery. -BP and HR remain stable.  Review of systems complete and found to be negative unless listed above    Past Medical History:  Diagnosis Date   Arthritis    CHF (congestive heart failure) (HCC)    Coronary artery disease    a. 12/17 PCI with DES to pLAD with resdiual disease (RX therapy)   Depression    Global hypokinesis of left ventricle    Gout    Grade II diastolic dysfunction    History of kidney stones    History of percutaneous coronary intervention 2017   Hyperlipidemia    Hypertension    Left atrial dilation    Neuromuscular disorder (HCC)    leg numb/ can walk by self   PAD (peripheral artery disease)    2 stents in left leg   Right atrial dilation    S/P drug eluting coronary stent placement 2017   STEMI (ST elevation myocardial infarction) (HCC) 2017   Substance abuse (HCC)    alcohol and marijuana    Past Surgical History:  Procedure Laterality Date   APPLICATION OF CELL SAVER N/A 07/13/2024    Procedure: APPLICATION OF CELL SAVER;  Surgeon: Gu Selinda RAMAN, MD;  Location: [facility] ORS;  Service: Vascular;  Laterality: N/A;   CARDIAC CATHETERIZATION N/A 06/17/2016   Procedure: Left Heart Cath and Coronary Angiography;  Surgeon: Dorn JINNY Lesches, MD;  Location: [facility] INVASIVE CV LAB;  Service: Cardiovascular;  Laterality: N/A;   CARDIAC CATHETERIZATION N/A 06/17/2016   Procedure: Coronary Stent Intervention;  Surgeon: Dorn JINNY Lesches, MD;  Location: [facility] INVASIVE CV LAB;  Service: Cardiovascular;  Laterality: N/A;   cardiac stents     CATARACT EXTRACTION W/PHACO Right 05/30/2024   Procedure: PHACOEMULSIFICATION, CATARACT, WITH IOL INSERTION 2.49 00:23.5;  Surgeon: Myrna Adine Anes, MD;  Location: [facility];  Service: Ophthalmology;  Laterality: Right;   CATARACT EXTRACTION W/PHACO Left 06/13/2024   Procedure: PHACOEMULSIFICATION, CATARACT, WITH IOL INSERTION 2.52 00:27.8;  Surgeon: Myrna Adine Anes, MD;  Location: [facility];  Service: Ophthalmology;  Laterality: Left;   CERVICAL SPINE SURGERY     ENDARTERECTOMY FEMORAL Left 07/13/2024   Procedure: ENDARTERECTOMY, FEMORAL;  Surgeon: Gu Selinda RAMAN, MD;  Location: [facility] ORS;  Service: Vascular;  Laterality: Left;   ENDARTERECTOMY TIBIOPERONEAL  07/13/2024   Procedure: THROMBOENDARTERECTOMY, TIBIOPERONEAL TRUNK;  Surgeon: Gu Selinda RAMAN, MD;  Location: [facility] ORS;  Service: Vascular;;   FEMORAL-TIBIAL BYPASS GRAFT Left 07/13/2024   Procedure: CREATION, BYPASS, ARTERIAL, FEMORAL TO TIBIAL, USING GRAFT;  Surgeon: Gu Selinda RAMAN, MD;  Location: [facility]  ORS;  Service: Vascular;  Laterality: Left;   IR NEPHROSTOMY PLACEMENT LEFT  07/12/2024   LOWER EXTREMITY ANGIOGRAPHY Left 06/25/2020   Procedure: LOWER EXTREMITY ANGIOGRAPHY;  Surgeon: Marea Selinda RAMAN, MD;  Location: [facility] INVASIVE CV LAB;  Service: Cardiovascular;  Laterality: Left;   LOWER EXTREMITY ANGIOGRAPHY Left 03/11/2023   Procedure: Lower Extremity Angiography;  Surgeon: Marea Selinda RAMAN, MD;   Location: [facility] INVASIVE CV LAB;  Service: Cardiovascular;  Laterality: Left;   LOWER EXTREMITY INTERVENTION Left 07/11/2024   Procedure: Lower Extremity Angiography;  Surgeon: Marea Selinda RAMAN, MD;  Location: [facility] INVASIVE CV LAB;  Service: Cardiovascular;  Laterality: Left;    Medications Prior to Admission  Medication Sig Dispense Refill Last Dose/Taking   acetaminophen  (TYLENOL ) 325 MG tablet Take 2 tablets (650 mg total) by mouth every 6 (six) hours as needed for moderate pain, mild pain, fever or headache.   Unknown   allopurinol  (ZYLOPRIM ) 300 MG tablet Take 300 mg by mouth daily.   07/11/2024 Morning   aspirin  EC 81 MG tablet Take 1 tablet (81 mg total) by mouth daily. Swallow whole. 30 tablet 12 07/10/2024   atorvastatin  (LIPITOR ) 80 MG tablet Take 1 tablet (80 mg total) by mouth daily. (Patient taking differently: Take 40 mg by mouth daily.)   07/11/2024 Morning   colchicine  0.6 MG tablet Take 0.6 mg by mouth.   Past Week   dapagliflozin  propanediol (FARXIGA ) 10 MG TABS tablet Take 10 mg by mouth daily.   07/11/2024 Morning   ELIQUIS  5 MG TABS tablet Take 1 tablet by mouth 2 (two) times daily.   07/10/2024   furosemide  (LASIX ) 40 MG tablet Take 40 mg by mouth 2 (two) times daily.   07/11/2024 Morning   gabapentin  (NEURONTIN ) 300 MG capsule Take 300 mg by mouth 3 (three) times daily.   07/10/2024   ibuprofen (ADVIL) 200 MG tablet Take 200 mg by mouth every 6 (six) hours as needed for headache or mild pain.   Unknown   metoprolol  succinate (TOPROL -XL) 25 MG 24 hr tablet Take 12.5 mg by mouth.   07/11/2024 Morning   nicotine  (NICODERM CQ  - DOSED IN MG/24 HOURS) 14 mg/24hr patch One 14 mg patch chest wall daily as needed for nicotine  craving.  (Can substitute generic) 28 patch 0 Unknown   nitroGLYCERIN  (NITROSTAT ) 0.4 MG SL tablet Place under the tongue.   Unknown   Polyethylene Glycol 4500 POWD 1 application  by Does not apply route as needed.   Unknown   sertraline (ZOLOFT) 100 MG tablet Take 100 mg by mouth  daily.   07/11/2024   spironolactone  (ALDACTONE ) 25 MG tablet Take 25 mg by mouth 2 (two) times daily.   07/11/2024 Morning   collagenase  (SANTYL ) 250 UNIT/GM ointment Apply topically.      hydrocortisone  (ANUSOL -HC) 25 MG suppository One suppository twice a day as needed for hemorrhoids.  (Generic okay to substitute) (Patient not taking: Reported on 07/12/2024) 12 suppository 0 Not Taking   oxyCODONE  (OXY IR/ROXICODONE ) 5 MG immediate release tablet Take 1 tablet (5 mg total) by mouth every 8 (eight) hours as needed for severe pain (pain score 7-10). (Patient not taking: Reported on 07/11/2024) 20 tablet 0 Not Taking   pantoprazole  (PROTONIX ) 40 MG tablet Take 1 tablet (40 mg total) by mouth daily. 30 tablet 0    predniSONE  (DELTASONE ) 20 MG tablet Take 20 mg by mouth 2 (two) times daily. (Patient not taking: Reported on 06/22/2024)   Not Taking   sertraline (ZOLOFT) 50  MG tablet Take 50 mg by mouth daily. (Patient not taking: Reported on 07/11/2024)   Not Taking   Social History   Socioeconomic History   Marital status: Divorced    Spouse name: Not on file   Number of children: Not on file   Years of education: Not on file   Highest education level: Not on file  Occupational History   Not on file  Tobacco Use   Smoking status: Every Day    Current packs/day: 1.00    Types: Cigarettes   Smokeless tobacco: Never  Vaping Use   Vaping status: Never Used  Substance and Sexual Activity   Alcohol use: Not Currently   Drug use: Yes    Frequency: 1.0 times per week    Types: Marijuana    Comment: twice monthly   Sexual activity: Not Currently  Other Topics Concern   Not on file  Social History Narrative   Not on file   Social Drivers of Health   Tobacco Use: High Risk (07/13/2024)   Patient History    Smoking Tobacco Use: Every Day    Smokeless Tobacco Use: Never    Passive Exposure: Not on file  Financial Resource Strain: Low Risk  (03/24/2024)   Received from [facility]    Overall Financial Resource Strain (CARDIA)    Difficulty of Paying Living Expenses: Not hard at all  Food Insecurity: Food Insecurity Present (07/11/2024)   Epic    Worried About Programme Researcher, Broadcasting/film/video in the Last Year: Often true    Ran Out of Food in the Last Year: Never true  Transportation Needs: No Transportation Needs (07/11/2024)   Epic    Lack of Transportation (Medical): No    Lack of Transportation (Non-Medical): No  Physical Activity: Not on file  Stress: Not on file  Social Connections: Socially Isolated (07/11/2024)   Social Connection and Isolation Panel    Frequency of Communication with Friends and Family: Never    Frequency of Social Gatherings with Friends and Family: Never    Attends Religious Services: Never    Database Administrator or Organizations: No    Attends Banker Meetings: Never    Marital Status: Divorced  Catering Manager Violence: Not At Risk (07/11/2024)   Epic    Fear of Current or Ex-Partner: No    Emotionally Abused: No    Physically Abused: No    Sexually Abused: No  Depression (PHQ2-9): Not on file  Alcohol Screen: Not on file  Housing: Low Risk (07/11/2024)   Epic    Unable to Pay for Housing in the Last Year: No    Number of Times Moved in the Last Year: 1    Homeless in the Last Year: No  Utilities: At Risk (07/11/2024)   Epic    Threatened with loss of utilities: Yes  Health Literacy: Not on file    Family History  Problem Relation Age of Onset   CAD Paternal Grandfather    Breast cancer Mother    Parkinson's disease Father    Alzheimer's disease Father    Hypertension Father    Diabetes Father      Vitals:   07/14/24 2016 07/15/24 0316 07/15/24 0604 07/15/24 0755  BP: 98/75 95/63 105/67 120/77  Pulse: 90 95 79 (!) 102  Resp: 16 17  17   Temp: 98.8 F (37.1 C) 98.7 F (37.1 C)  98.1 F (36.7 C)  TempSrc:      SpO2:  94% 93%  97%  Weight:      Height:        PHYSICAL EXAM General: Well appearing male, well  nourished, in no acute distress. HEENT: Normocephalic and atraumatic. Neck: No JVD.  Lungs: Normal respiratory effort on room air. Clear bilaterally to auscultation. No wheezes, crackles, rhonchi.  Heart: HRRR. Normal S1 and S2 without gallops or murmurs.  Abdomen: Non-distended appearing.  Msk: Normal strength and tone for age. Extremities: Warm and well perfused. No clubbing, cyanosis. No edema.  Neuro: Alert and oriented X 3. Psych: Answers questions appropriately.   Labs: Basic Metabolic Panel: Recent Labs    07/14/24 0500 07/15/24 0449  NA 137 138  K 4.3 5.2*  CL 106 106  CO2 22 23  GLUCOSE 89 94  BUN 18 17  CREATININE 1.04 1.23  CALCIUM  8.6* 9.0   Liver Function Tests: No results for input(s): AST, ALT, ALKPHOS, BILITOT, PROT, ALBUMIN  in the last 72 hours.  No results for input(s): LIPASE, AMYLASE in the last 72 hours. CBC: Recent Labs    07/14/24 0500 07/15/24 0449  WBC 8.5 9.7  HGB 11.9* 12.2*  HCT 37.0* 38.2*  MCV 95.4 95.7  PLT 90* 96*   Cardiac Enzymes: No results for input(s): CKTOTAL, CKMB, CKMBINDEX, TROPONINIHS in the last 72 hours. BNP: No results for input(s): BNP in the last 72 hours. D-Dimer: No results for input(s): DDIMER in the last 72 hours. Hemoglobin A1C: No results for input(s): HGBA1C in the last 72 hours. Fasting Lipid Panel: No results for input(s): CHOL, HDL, LDLCALC, TRIG, CHOLHDL, LDLDIRECT in the last 72 hours. Thyroid  Function Tests: No results for input(s): TSH, T4TOTAL, T3FREE, THYROIDAB in the last 72 hours.  Invalid input(s): FREET3 Anemia Panel: No results for input(s): VITAMINB12, FOLATE, FERRITIN, TIBC, IRON, RETICCTPCT in the last 72 hours.   Radiology: IR NEPHROSTOMY PLACEMENT LEFT Result Date: 07/12/2024 INDICATION: 64 year old with left hydronephrosis secondary to a bladder mass. Request for a percutaneous nephrostomy tube placement. EXAM: PLACEMENT OF  LEFT NEPHROSTOMY TUBE WITH ULTRASOUND AND FLUOROSCOPIC GUIDANCE COMPARISON:  CT abdomen pelvis 06/28/2024 MEDICATIONS: Rocephin  2 g; The antibiotic was administered in an appropriate time frame prior to skin puncture. ANESTHESIA/SEDATION: Moderate (conscious) sedation was employed during this procedure. A total of Versed  2 mg and Fentanyl  100 mcg was administered intravenously by the radiology nurse. Total intra-service moderate Sedation Time: 23 minutes. The patient's level of consciousness and vital signs were monitored continuously by radiology nursing throughout the procedure under my direct supervision. CONTRAST:  15 mL Omnipaque  300-administered into the collecting system(s) FLUOROSCOPY: Radiation Exposure Index (as provided by the fluoroscopic device): 4 mGy Kerma COMPLICATIONS: None immediate. PROCEDURE: Informed written consent was obtained from the patient after a thorough discussion of the procedural risks, benefits and alternatives. All questions were addressed. Maximal Sterile Barrier Technique was utilized including caps, mask, sterile gowns, sterile gloves, sterile drape, hand hygiene and skin antiseptic. A timeout was performed prior to the initiation of the procedure. Patient was placed prone. Left flank was prepped and draped in sterile fashion. Ultrasound was used to identify the left kidney. Ultrasound image was saved for documentation. Skin was anesthetized with 1% lidocaine . Using ultrasound guidance, a 22 gauge needle was directed into a mildly dilated lower pole calyx. Contrast injection confirmed placement in the renal collecting system. 0.018 wire was advanced into the renal collecting system. Accustick dilator set was placed. The tract was dilated over a superstiff Amplatz wire and a 10 French multipurpose drain was reconstituted  in the renal pelvis. Contrast injection confirmed placement in the renal pelvis. Drain was sutured to skin and attached to a gravity bag. Fluoroscopic and  ultrasound images were taken and saved for documentation. FINDINGS: Mild to moderate left hydronephrosis. Nephrostomy tube was placed via a lower pole calyx. Nephrostomy tube was reconstituted in the renal pelvis. IMPRESSION: Successful placement of a left percutaneous nephrostomy tube using ultrasound and fluoroscopic guidance. Electronically Signed   By: Juliene Balder M.D.   On: 07/12/2024 12:39   PERIPHERAL VASCULAR CATHETERIZATION Result Date: 07/11/2024 See surgical note for result.  CT HEMATURIA WORKUP Result Date: 07/02/2024 CLINICAL DATA:  Gross hematuria * Tracking Code: BO * EXAM: CT ABDOMEN AND PELVIS WITHOUT AND WITH CONTRAST TECHNIQUE: Multidetector CT imaging of the abdomen and pelvis was performed following the standard protocol before and following the bolus administration of intravenous contrast. RADIATION DOSE REDUCTION: This exam was performed according to the departmental dose-optimization program which includes automated exposure control, adjustment of the mA and/or kV according to patient size and/or use of iterative reconstruction technique. CONTRAST:  OMNIPAQUE  IOHEXOL  300 MG/ML  SOLN COMPARISON:  05/21/2024 FINDINGS: Lower chest: No acute abnormality.  Coronary artery calcifications. Hepatobiliary: No solid liver abnormality is seen. Cholecystectomy. No biliary ductal dilatation. Pancreas: Unremarkable. No pancreatic ductal dilatation or surrounding inflammatory changes. Spleen: Normal in size without significant abnormality. Adrenals/Urinary Tract: Adrenal glands are unremarkable. Mild left hydronephrosis and hydroureter. Small nonobstructive bilateral renal calculi and or renal vascular calcifications. Contrast enhancing endoluminal mass of the posterior left aspect of the bladder overlying and obstructing the ureterovesicular junction measuring 3.8 x 2.0 cm (series 8, image 74). Stomach/Bowel: Stomach is within normal limits. Appendix appears normal. Sigmoid diverticulosis. Mild,  long segment wall thickening of the descending and sigmoid colon (series 8, image 68). Vascular/Lymphatic: Aortic atherosclerosis. Unchanged prominent retroperitoneal lymph nodes, index aortocaval node measuring up to 1.0 x 0.9 cm (series 8, image 37). Reproductive: No mass or other significant abnormality. Other: Small fat containing right inguinal hernia. Fat containing parastomal hernia in the left lower quadrant (series 8, image 57). No ascites. Musculoskeletal: No acute or significant osseous findings. IMPRESSION: 1. Contrast enhancing endoluminal mass of the posterior left aspect of the bladder overlying and obstructing the ureterovesicular junction measuring 3.8 x 2.0 cm. Findings are consistent with bladder malignancy. 2. Mild left hydronephrosis and hydroureter. 3. Unchanged prominent retroperitoneal lymph nodes, index aortocaval node measuring up to 1.0 x 0.9 cm. No definite evidence of lymphadenopathy or metastatic disease in the abdomen or pelvis. Consider PET-CT for metabolic characterization. 4. Small nonobstructive bilateral renal calculi and/or renal vascular calcifications. 5. Sigmoid diverticulosis. Mild, long segment wall thickening of the descending and sigmoid colon, suggestive of colitis, possibly reflecting chronic sequelae of prior diverticulitis. Appearance is similar to prior examination. 6. Coronary artery disease. Aortic Atherosclerosis (ICD10-I70.0). Electronically Signed   By: Marolyn JONETTA Jaksch M.D.   On: 07/02/2024 07:15   VAS US  LOWER EXTREMITY ARTERIAL DUPLEX Result Date: 06/27/2024 LOWER EXTREMITY ARTERIAL DUPLEX STUDY Patient Name:  Cameron [name]  Date of Exam:   06/22/2024 Medical Rec #: [id number]          Accession #:    [id number] Date of Birth: 1960-11-13          Patient Gender: M Patient Age:   46 years Exam Location:  [facility] Procedure:      VAS US  LOWER EXTREMITY ARTERIAL DUPLEX Referring Phys: ORVIN DARING  --------------------------------------------------------------------------------  Indications: Peripheral artery disease.  Vascular  Interventions: 06/25/20: Left EIA & SFA/popliteal stents with TP trunk,                         peroneal & ATA angioplasties;                         03/11/23: Left SFA/popliteal thrombectomy/PTA/stent with                         TP trunk & peroneal angioplasties;SABRA Current ABI:            Right=0.93 & Left=0.60 Performing Technologist: Elsie Churn RT, RDMS, RVT  Examination Guidelines: A complete evaluation includes B-mode imaging, spectral Doppler, color Doppler, and power Doppler as needed of all accessible portions of each vessel. Bilateral testing is considered an integral part of a complete examination. Limited examinations for reoccurring indications may be performed as noted  +--------------+--------+-----+--------+-------------------+--------------+ LEFT          PSV cm/sRatioStenosisWaveform           Comments       +--------------+--------+-----+--------+-------------------+--------------+ CFA Mid       36                   hyperemic                         +--------------+--------+-----+--------+-------------------+--------------+ DFA           124                  hyperemic                         +--------------+--------+-----+--------+-------------------+--------------+ SFA Prox                   occluded                                  +--------------+--------+-----+--------+-------------------+--------------+ SFA Mid                    occluded                                  +--------------+--------+-----+--------+-------------------+--------------+ SFA Distal                 occluded                                  +--------------+--------+-----+--------+-------------------+--------------+ POP Prox                   occluded                                   +--------------+--------+-----+--------+-------------------+--------------+ POP Distal                 occluded                                  +--------------+--------+-----+--------+-------------------+--------------+ ATA Distal    17  dampened monophasic               +--------------+--------+-----+--------+-------------------+--------------+ PTA Mid/Distal             occluded                                  +--------------+--------+-----+--------+-------------------+--------------+ PTA Distal    10                   dampened monophasicvia collateral +--------------+--------+-----+--------+-------------------+--------------+ PERO Distal   25                   dampened monophasic               +--------------+--------+-----+--------+-------------------+--------------+  Summary: Left: Occlusion of the left SFA/popliteal artery stent and mid/distal PTA.  See table(s) above for measurements and observations. Electronically signed by Selinda Gu MD on 06/27/2024 at 7:54:41 AM.    Final    VAS US  ABI WITH/WO TBI Result Date: 06/27/2024  LOWER EXTREMITY DOPPLER STUDY Patient Name:  Cameron [name]  Date of Exam:   06/22/2024 Medical Rec #: [id number]          Accession #:    [id number] Date of Birth: 19-Dec-1960          Patient Gender: M Patient Age:   19 years Exam Location:  [facility] Procedure:      VAS US  ABI WITH/WO TBI Referring Phys: SELINDA DEW --------------------------------------------------------------------------------  Indications: Peripheral artery disease.  Vascular Interventions: 06/25/20: Left EIA & SFA/popliteal stents with TP trunk,                         peroneal & ATA angioplasties;                         03/11/23: Left SFA/popliteal thrombectomy/PTA/stent with                         TP trunk & peroneal angioplasties;SABRA Performing Technologist: Elsie Churn RT, RDMS, RVT  Examination Guidelines: A complete evaluation includes  at minimum, Doppler waveform signals and systolic blood pressure reading at the level of bilateral brachial, anterior tibial, and posterior tibial arteries, when vessel segments are accessible. Bilateral testing is considered an integral part of a complete examination. Photoelectric Plethysmograph (PPG) waveforms and toe systolic pressure readings are included as required and additional duplex testing as needed. Limited examinations for reoccurring indications may be performed as noted.  ABI Findings: +---------+------------------+-----+--------+-------+ Right    Rt Pressure (mmHg)IndexWaveformComment +---------+------------------+-----+--------+-------+ Brachial 92                                     +---------+------------------+-----+--------+-------+ PTA      73                0.79 biphasic        +---------+------------------+-----+--------+-------+ DP       86                0.93 biphasic        +---------+------------------+-----+--------+-------+ Great Toe57                0.62 Abnormal        +---------+------------------+-----+--------+-------+ +---------+------------------+-----+-------------------+--------------+ Left     Lt Pressure (mmHg)IndexWaveform  Comment        +---------+------------------+-----+-------------------+--------------+ Brachial 89                                                       +---------+------------------+-----+-------------------+--------------+ PTA      55                0.60 dampened monophasicvia collateral +---------+------------------+-----+-------------------+--------------+ PERO     50                0.54 dampened monophasic               +---------+------------------+-----+-------------------+--------------+ DP       46                0.50 dampened monophasic               +---------+------------------+-----+-------------------+--------------+ Great Toe33                0.36 Abnormal                           +---------+------------------+-----+-------------------+--------------+ +-------+-----------+-----------+------------+------------+ ABI/TBIToday's ABIToday's TBIPrevious ABIPrevious TBI +-------+-----------+-----------+------------+------------+ Right  0.93       0.62       0.93        NA           +-------+-----------+-----------+------------+------------+ Left   0.60       0.36       0.51        NA           +-------+-----------+-----------+------------+------------+ TOES Findings: +----------+---------------+---------------+-------+ Right ToesPressure (mmHg)Waveform       Comment +----------+---------------+---------------+-------+ 1st Digit                Abnormal               +----------+---------------+---------------+-------+ 2nd Digit                mildly abnormal        +----------+---------------+---------------+-------+ 3rd Digit                mildly abnormal        +----------+---------------+---------------+-------+ 4th Digit                mildly abnormal        +----------+---------------+---------------+-------+ 5th Digit                Normal                 +----------+---------------+---------------+-------+  +---------+---------------+-----------------+-------+ Left ToesPressure (mmHg)Waveform         Comment +---------+---------------+-----------------+-------+ 1st Digit               severely dampened        +---------+---------------+-----------------+-------+ 2nd Digit               severely dampened        +---------+---------------+-----------------+-------+ 3rd Digit               severely dampened        +---------+---------------+-----------------+-------+ 4th Digit               severely dampened        +---------+---------------+-----------------+-------+ 5th Digit               severely dampened        +---------+---------------+-----------------+-------+  Bilateral ABIs appear essentially  unchanged compared to prior study on 03/10/23 done at [facility] prior to last intervention.  Summary: Right: Resting right ankle-brachial index indicates mild right lower extremity arterial disease. The right toe-brachial index is abnormal.  Left: Resting left ankle-brachial index indicates moderate left lower extremity arterial disease. The left toe-brachial index is abnormal.  *See table(s) above for measurements and observations.  Electronically signed by Selinda Gu MD on 06/27/2024 at 7:54:32 AM.    Final     ECHO 03/29/2024: SEVERE LEFT VENTRICULAR SYSTOLIC DYSFUNCTION WITH NO LVH  ESTIMATED EF: 20%  NORMAL LA PRESSURES WITH DIASTOLIC DYSFUNCTION (GRADE 1)  NORMAL RIGHT VENTRICULAR SYSTOLIC FUNCTION  VALVULAR REGURGITATION: No AR, MILD MR, MILD PR, MILD TR  ESTIMATED RVSP: 34 mmHg (Normal)  NO VALVULAR STENOSIS   ECHO 05/2021: SEVERE LV SYSTOLIC DYSFUNCTION (See above)  MODERATE RV SYSTOLIC DYSFUNCTION (See above)  MODERATE VALVULAR REGURGITATION (See above)  NO VALVULAR STENOSIS  ESTIMATED LVEF 20%  Aortic: NORMAL GRADIENTS  Mitral: MILD - MODERATE MR  Tricuspid: MODERATE TR (3.45m/s)  Pulmonic: MODERATE PI  MODERATE LAE  MILD RAE  MILD LVE  MODERATE RVE  MODERATELY DILATED MAIN PULMONARY ARTERY MEASURING 2.6cm   TELEMETRY (personally reviewed): not on tele  EKG 07/17/2022 (personally reviewed): NSR rate 100 bpm  Data reviewed by me 07/15/2024: last 24h vitals tele labs imaging I/O vascular notes  Principal Problem:   Atherosclerosis of artery of extremity with rest pain [facility])    ASSESSMENT AND PLAN:  Cameron [name] is a 64 y.o. male  with a past medical history of chronic systolic CHF with apical akinesis with an EF of 20%, coronary artery disease with STEMI in 2017 with PCI and stent to LAD, paroxysmal atrial fibrillation, NSVT, apical mural thrombus, hypertension, hyperlipidemia who presented on 07/11/2024 for outpatient peripheral vascular catheterization. Found to have worsening  of his PAD and needs bypass.  Cardiology was consulted for pre-operative evaluation.   # Preoperative cardiovascular evaluation # Peripheral vascular disease # Chronic HFrEF # Coronary artery disease # Hx apical mural thrombus Patient presented for outpatient peripheral vascular cath and was found to have worsening of PAD. Vascular surgery planning for bypass later this week. He has known hx of systolic heart failure which has been stable for many years. Also has hx of CAD with no symptoms of chest pain/angina now or at all recently. S/p LE bypass 07/13/2024. -Continue home farxiga  and metoprolol . BP not likely to tolerate resuming spironolactone . Can consider restarting this outpatient.  -Ok to resume eliquis  today per vascular surgery.  -Continue aspirin  81 mg daily and atorvastatin  40 mg daily.   Cardiology will sign off. Please haiku with questions or re-engage if needed. Follow up with Dr. Custovic in 1-2 weeks.   This patient's plan of care was discussed and created with Dr. Florencio and he is in agreement.  Signed: Danita Bloch, PA-C  07/15/2024, 9:51 AM [facility] Cardiology      "

## 2024-07-15 NOTE — Progress Notes (Signed)
 Physical Therapy Treatment Patient Details Name: Cameron [name] MRN: [id number] DOB: May 24, 1961 Today's Date: 07/15/2024   History of Present Illness 64 y.o. male  with a past medical history of chronic systolic CHF with apical akinesis with an EF of 20%, coronary artery disease with STEMI in 2017 with PCI and stent to LAD, paroxysmal atrial fibrillation, NSVT, apical mural thrombus, hypertension, hyperlipidemia who presented on 07/11/2024 for outpatient peripheral vascular catheterization. Now s/p left femoral to distal bypass, left tibial endarterectomy, common femoral/SFA endarterectomy, and separate and distinct profunda femoris endarterectomy.    PT Comments  Pt in recliner on entry, reports RN provided pain meds early. Pt able to advance AMB today compared to prior session, but does remain quite limited by pain and UE strength/endurance. Pt set up for ADL performance at sink in room, no gross LOB, generally good tolerance while up, although weightbearing on LLE remains painful. We established a plan to execute face shaving later in day. Have asked OT to see pt for helpful tips on less pain restricted LBD.    If plan is discharge home, recommend the following: A little help with walking and/or transfers;A little help with bathing/dressing/bathroom;Assistance with cooking/housework;Assist for transportation;Help with stairs or ramp for entrance   Can travel by private vehicle     Yes  Equipment Recommendations  BSC/3in1    Recommendations for Other Services       Precautions / Restrictions Precautions Precautions: Fall Recall of Precautions/Restrictions: Intact Restrictions Weight Bearing Restrictions Per Provider Order: No     Mobility  Bed Mobility                    Transfers Overall transfer level: Needs assistance Equipment used: Rolling walker (2 wheels) Transfers: Sit to/from Stand Sit to Stand: Supervision           General transfer comment: heavy  UE use, but no imbalance    Ambulation/Gait Ambulation/Gait assistance: Supervision Gait Distance (Feet): 110 Feet Assistive device: Rolling walker (2 wheels) Gait Pattern/deviations: Step-to pattern, Antalgic Gait velocity: 0.59m/s     General Gait Details: pauses for hand relief; heavy UE support on RW for antalgia   Stairs             Wheelchair Mobility     Tilt Bed    Modified Rankin (Stroke Patients Only)       Balance                                            Communication    Cognition Arousal: Alert Behavior During Therapy: WFL for tasks assessed/performed   PT - Cognitive impairments: No apparent impairments                                Cueing    Exercises Other Exercises Other Exercises: standing at sink for ADL performance, 8 minutes, variable UE support 0-2. washes face, hair, head, combs hair, brushes teeth. no frank LOB    General Comments        Pertinent Vitals/Pain Pain Assessment Pain Assessment: 0-10 Pain Score: 6  Pain Location: LLE, groin Pain Descriptors / Indicators: Burning Pain Intervention(s): Limited activity within patient's tolerance, Monitored during session, Premedicated before session    Home Living  Prior Function            PT Goals (current goals can now be found in the care plan section) Acute Rehab PT Goals Patient Stated Goal: go home PT Goal Formulation: With patient Time For Goal Achievement: 07/27/24 Potential to Achieve Goals: Good Progress towards PT goals: Progressing toward goals    Frequency    Min 2X/week      PT Plan      Co-evaluation              AM-PAC PT 6 Clicks Mobility   Outcome Measure  Help needed turning from your back to your side while in a flat bed without using bedrails?: None Help needed moving from lying on your back to sitting on the side of a flat bed without using bedrails?: A  Little Help needed moving to and from a bed to a chair (including a wheelchair)?: A Little Help needed standing up from a chair using your arms (e.g., wheelchair or bedside chair)?: A Little Help needed to walk in hospital room?: A Lot Help needed climbing 3-5 steps with a railing? : A Lot 6 Click Score: 17    End of Session Equipment Utilized During Treatment: Gait belt Activity Tolerance: Patient tolerated treatment well;Patient limited by fatigue Patient left: with call bell/phone within reach;in bed Nurse Communication: Mobility status PT Visit Diagnosis: Muscle weakness (generalized) (M62.81);Difficulty in walking, not elsewhere classified (R26.2)     Time: 1006-1040 PT Time Calculation (min) (ACUTE ONLY): 34 min  Charges:    $Therapeutic Exercise: 8-22 mins $Therapeutic Activity: 8-22 mins PT General Charges $$ ACUTE PT VISIT: 1 Visit                    11:00 AM, 07/15/2024 Peggye JAYSON Linear, PT, DPT Physical Therapist - [facility]  [phone] (ASCOM)     Jamice Carreno C 07/15/2024, 10:56 AM

## 2024-07-15 NOTE — Progress Notes (Signed)
 [facility]  Daily Progress Note   Subjective  -   Has moved out to the floor.  Doing fairly well.  Appropriate postoperative pain which has gotten better in the last 24 hours.  Happy that he can feel his left foot and it is warm.  Objective Vitals:   07/14/24 2016 07/15/24 0316 07/15/24 0604 07/15/24 0755  BP: 98/75 95/63 105/67 120/77  Pulse: 90 95 79 (!) 102  Resp: 16 17  17   Temp: 98.8 F (37.1 C) 98.7 F (37.1 C)  98.1 F (36.7 C)  TempSrc:      SpO2: 94% 93%  97%  Weight:      Height:        Intake/Output Summary (Last 24 hours) at 07/15/2024 0823 Last data filed at 07/15/2024 0555 Gross per 24 hour  Intake 1205.88 ml  Output 2500 ml  Net -1294.12 ml    PULM  CTAB CV  RRR VASC  good capillary refill with warm left foot.  Posterior tibial pulse is weakly palpable.  Laboratory CBC    Component Value Date/Time   WBC 9.7 07/15/2024 0449   HGB 12.2 (L) 07/15/2024 0449   HGB 10.6 (L) 03/26/2014 1232   HCT 38.2 (L) 07/15/2024 0449   HCT 32.1 (L) 03/26/2014 1232   PLT 96 (L) 07/15/2024 0449   PLT 137 (L) 03/26/2014 1232    BMET    Component Value Date/Time   NA 138 07/15/2024 0449   NA 139 03/28/2014 0823   K 5.2 (H) 07/15/2024 0449   K 3.4 (L) 03/28/2014 0823   CL 106 07/15/2024 0449   CL 111 (H) 03/28/2014 0823   CO2 23 07/15/2024 0449   CO2 22 03/28/2014 0823   GLUCOSE 94 07/15/2024 0449   GLUCOSE 106 (H) 03/28/2014 0823   BUN 17 07/15/2024 0449   BUN 8 03/28/2014 0823   CREATININE 1.23 07/15/2024 0449   CREATININE 0.97 03/28/2014 0823   CALCIUM  9.0 07/15/2024 0449   CALCIUM  7.8 (L) 03/28/2014 0823   GFRNONAA >60 07/15/2024 0449   GFRNONAA >60 03/28/2014 0823   GFRAA >60 02/21/2020 0630   GFRAA >60 03/28/2014 0823    Assessment/Planning: POD #2 s/p left fem-distal bypass, multiple endarterectomies  Doing quite well overall Out on the floor and working with physical therapy Able to do some walking with a walker Urine  has cleared and he has nephrostomy tubes in place Okay to increase activity as tolerated but I think he still several days from being ready to discharge home by himself Aspirin /Plavix /statin agent for medical therapy   Selinda Gu  07/15/2024, 8:23 AM

## 2024-07-15 NOTE — Evaluation (Signed)
 Occupational Therapy Evaluation Patient Details Name: Cameron [name] MRN: [id number] DOB: 1960/07/08 Today's Date: 07/15/2024   History of Present Illness   64 y.o. male  with a past medical history of chronic systolic CHF with apical akinesis with an EF of 20%, coronary artery disease with STEMI in 2017 with PCI and stent to LAD, paroxysmal atrial fibrillation, NSVT, apical mural thrombus, hypertension, hyperlipidemia who presented on 07/11/2024 for outpatient peripheral vascular catheterization. Now s/p left femoral to distal bypass, left tibial endarterectomy, common femoral/SFA endarterectomy, and separate and distinct profunda femoris endarterectomy.     Clinical Impressions Patient presenting with decreased Ind in self care,balance, functional mobility, transfers, endurance, and safety awareness. Patient reports living at home alone and being Ind at baseline.Patient currently functioning at supervision overall for functional mobility with use of RW and heavy use of UEs secondary to L LE pain. Pt ambulates to bathroom for self care needs. Pt returning back to bed at end of session. He is able to use R UE to assist L LE to come into figure four position for LB dressing. Pt is able to don/doff L sock without assistance. Pt hopes that he can discharge to his child's home. Patient will benefit from acute OT to increase overall independence in the areas of ADLs, functional mobility, and safety awareness in order to safely discharge.     If plan is discharge home, recommend the following:   A little help with walking and/or transfers;A little help with bathing/dressing/bathroom;Assistance with cooking/housework;Help with stairs or ramp for entrance;Assist for transportation     Functional Status Assessment   Patient has had a recent decline in their functional status and demonstrates the ability to make significant improvements in function in a reasonable and predictable amount of time.      Equipment Recommendations   Other (comment) (defer)      Precautions/Restrictions   Precautions Precautions: Fall Recall of Precautions/Restrictions: Intact Restrictions Weight Bearing Restrictions Per Provider Order: No     Mobility Bed Mobility Overal bed mobility: Needs Assistance Bed Mobility: Supine to Sit, Sit to Supine     Supine to sit: Supervision Sit to supine: Supervision        Transfers Overall transfer level: Needs assistance Equipment used: Rolling walker (2 wheels) Transfers: Sit to/from Stand Sit to Stand: Supervision                  Balance Overall balance assessment: Modified Independent                                         ADL either performed or assessed with clinical judgement   ADL Overall ADL's : Needs assistance/impaired                     Lower Body Dressing: Supervision/safety Lower Body Dressing Details (indicate cue type and reason): Pt able to perform figure four position to don/doff sock on L UE with R UE assisting to bring foot to lap               General ADL Comments: Pt ambulates with RW with supervision 10' to bathroom and stands to empty urinary drain.     Vision Patient Visual Report: No change from baseline              Pertinent Vitals/Pain Pain Assessment Pain Assessment: 0-10 Pain Score: 4  Pain  Location: LLE, groin Pain Descriptors / Indicators: Burning, Discomfort Pain Intervention(s): Limited activity within patient's tolerance, Monitored during session, Repositioned     Extremity/Trunk Assessment Upper Extremity Assessment Upper Extremity Assessment: Generalized weakness   Lower Extremity Assessment Lower Extremity Assessment: Overall WFL for tasks assessed;Generalized weakness       Communication Communication Communication: No apparent difficulties   Cognition Arousal: Alert Behavior During Therapy: WFL for tasks assessed/performed Cognition: No  apparent impairments                               Following commands: Intact       Cueing  General Comments   Cueing Techniques: Verbal cues              Home Living Family/patient expects to be discharged to:: Private residence Living Arrangements: Alone Available Help at Discharge: Available PRN/intermittently Type of Home: House Home Access: Stairs to enter Secretary/administrator of Steps: 4 Entrance Stairs-Rails: Can reach both Home Layout: One level     Bathroom Shower/Tub: Producer, Television/film/video: Standard     Home Equipment: Agricultural Consultant (2 wheels)   Additional Comments: Pt hoping to d/c to a child's home      Prior Functioning/Environment Prior Level of Function : Independent/Modified Independent             Mobility Comments: Pt reports only rarely needing walker during gout flares, otherwise active and independent ADLs Comments: Ind with self care and IADLs    OT Problem List: Decreased strength;Decreased safety awareness;Decreased activity tolerance;Impaired balance (sitting and/or standing)   OT Treatment/Interventions: Self-care/ADL training;Therapeutic activities;Energy conservation;Patient/family education;Balance training      OT Goals(Current goals can be found in the care plan section)   Acute Rehab OT Goals Patient Stated Goal: to decrease pain OT Goal Formulation: With patient Time For Goal Achievement: 07/29/24 Potential to Achieve Goals: Fair ADL Goals Pt Will Perform Grooming: with modified independence Pt Will Perform Lower Body Dressing: with modified independence;sit to/from stand Pt Will Transfer to Toilet: with modified independence;ambulating Pt Will Perform Toileting - Clothing Manipulation and hygiene: with modified independence;sit to/from stand   OT Frequency:  Min 2X/week       AM-PAC OT 6 Clicks Daily Activity     Outcome Measure Help from another person eating meals?: None Help  from another person taking care of personal grooming?: A Little Help from another person toileting, which includes using toliet, bedpan, or urinal?: A Little Help from another person bathing (including washing, rinsing, drying)?: A Little Help from another person to put on and taking off regular upper body clothing?: None Help from another person to put on and taking off regular lower body clothing?: A Little 6 Click Score: 20   End of Session Equipment Utilized During Treatment: Rolling walker (2 wheels) Nurse Communication: Mobility status  Activity Tolerance: Patient tolerated treatment well Patient left: in bed;with call bell/phone within reach;with bed alarm set  OT Visit Diagnosis: Unsteadiness on feet (R26.81);Repeated falls (R29.6);Muscle weakness (generalized) (M62.81)                Time: 1430-1455 OT Time Calculation (min): 25 min Charges:  OT General Charges $OT Visit: 1 Visit OT Evaluation $OT Eval Moderate Complexity: 1 Mod OT Treatments $Self Care/Home Management : 8-22 mins  Izetta Claude, MS, OTR/L , CBIS ascom [phone]  07/15/2024, 4:08 PM

## 2024-07-16 MED ORDER — MORPHINE SULFATE (PF) 2 MG/ML IV SOLN: 1.0000 mg | INTRAVENOUS | Status: DC | PRN

## 2024-07-16 MED ORDER — OXYCODONE HCL 5 MG PO TABS
5.0000 mg | ORAL_TABLET | Freq: Four times a day (QID) | ORAL | Status: DC | PRN
  Administered 2024-07-16 – 2024-07-20 (×14): 5 mg via ORAL
  Filled 2024-07-16 (×13): qty 1

## 2024-07-16 NOTE — Progress Notes (Signed)
 3 Days Post-Op   Subjective/Chief Complaint: OOB at sink independently upon exam. States he is feeling better. Soreness in LEFT groin, calf. States foot feels much better. Concerned about blood in nephrostomy tube.   Objective: Vital signs in last 24 hours: Temp:  [97.8 F (36.6 C)-98.7 F (37.1 C)] 98.2 F (36.8 C) (01/10 0746) Pulse Rate:  [75-87] 82 (01/10 0746) Resp:  [15-20] 17 (01/10 0746) BP: (86-106)/(59-68) 94/65 (01/10 0746) SpO2:  [96 %-99 %] 96 % (01/10 0746) Last BM Date : 07/11/24  Intake/Output from previous day: 01/09 0701 - 01/10 0700 In: 1080 [P.O.:1080] Out: 1435 [Urine:1435] Intake/Output this shift: No intake/output data recorded.  General appearance: alert and no distress Cardio: regular rate and rhythm Extremities: LEFT groin incision- C/D/I, soft, Leg warm, 1+edema, calf soft, incision- C/D/I, foot warm, good cap refill  Lab Results:  Recent Labs    07/14/24 0500 07/15/24 0449  WBC 8.5 9.7  HGB 11.9* 12.2*  HCT 37.0* 38.2*  PLT 90* 96*   BMET Recent Labs    07/14/24 0500 07/15/24 0449  NA 137 138  K 4.3 5.2*  CL 106 106  CO2 22 23  GLUCOSE 89 94  BUN 18 17  CREATININE 1.04 1.23  CALCIUM  8.6* 9.0   PT/INR No results for input(s): LABPROT, INR in the last 72 hours. ABG No results for input(s): PHART, HCO3 in the last 72 hours.  Invalid input(s): PCO2, PO2  Studies/Results: No results found.  Anti-infectives: Anti-infectives (From admission, onward)    Start     Dose/Rate Route Frequency Ordered Stop   07/13/24 1720  gentamicin  (GARAMYCIN ) injection  Status:  Discontinued          As needed 07/13/24 1721 07/13/24 1744   07/13/24 1710  vancomycin  (VANCOCIN ) powder  Status:  Discontinued          As needed 07/13/24 1721 07/13/24 1744   07/13/24 1054  ceFAZolin  (ANCEF ) IVPB 2g/100 mL premix        2 g 200 mL/hr over 30 Minutes Intravenous 30 min pre-op 07/13/24 1054 07/13/24 1605   07/12/24 0930  cefTRIAXone   (ROCEPHIN ) 2 g in sodium chloride  0.9 % 100 mL IVPB        over 30 Minutes  Continuous PRN 07/12/24 0930 07/12/24 0930   07/12/24 0912  cefTRIAXone  (ROCEPHIN ) 2 g in sodium chloride  0.9 % 100 mL IVPB  Status:  Discontinued        2 g 200 mL/hr over 30 Minutes Intravenous 30 min pre-op 07/12/24 0912 07/13/24 1912   07/11/24 0720  ceFAZolin  (ANCEF ) IVPB 2g/100 mL premix        2 g 200 mL/hr over 30 Minutes Intravenous 30 min pre-op 07/11/24 0721 07/11/24 0855       Assessment/Plan: s/p Procedures: ENDARTERECTOMY, FEMORAL (Left) APPLICATION OF CELL SAVER (N/A) THROMBOENDARTERECTOMY, TIBIOPERONEAL TRUNK CREATION, BYPASS, ARTERIAL, FEMORAL TO TIBIAL, USING GRAFT (Left) POD #3  Several questions answered- reassured patient LEFT groin/calf soreness and edema are normal and he is progressing. Will have Urology assess blood in Nephrostomy tube- (Discussed with Dr. Carolee); HgB stable-likely no immediate intervention at this juncture ASA/Eliquis /Statin Continue OOB    LOS: 5 days    Tisa Dakin A 07/16/2024

## 2024-07-16 NOTE — Progress Notes (Signed)
 Urology Inpatient Progress Report  Atherosclerosis of artery of extremity with rest pain (HCC) [I70.229]  Procedures: ENDARTERECTOMY, FEMORAL APPLICATION OF CELL SAVER THROMBOENDARTERECTOMY, TIBIOPERONEAL TRUNK CREATION, BYPASS, ARTERIAL, FEMORAL TO TIBIAL, USING GRAFT  3 Days Post-Op   Intv/Subj: Patient's nephrostomy tube had some bloody drainage today so I was asked to assess.  Patient remains on blood thinner following his bypass procedure.  Hemoglobin stable.  He is voiding without difficulty but does have a little bit of gross hematuria.  No clots.  Principal Problem:   Atherosclerosis of artery of extremity with rest pain (HCC)  Current Facility-Administered Medications  Medication Dose Route Frequency Provider Last Rate Last Admin   acetaminophen  (TYLENOL ) tablet 650 mg  650 mg Oral Q6H PRN Marea, Jason S, MD       alum & mag hydroxide-simeth (MAALOX/MYLANTA) 200-200-20 MG/5ML suspension 15-30 mL  15-30 mL Oral Q2H PRN Marea, Jason S, MD       apixaban  (ELIQUIS ) tablet 5 mg  5 mg Oral BID Hudson, Caralyn, PA-C   5 mg at 07/16/24 1008   aspirin  EC tablet 81 mg  81 mg Oral Daily Dew, Jason S, MD   81 mg at 07/16/24 1008   atorvastatin  (LIPITOR ) tablet 80 mg  80 mg Oral Daily Dew, Jason S, MD   80 mg at 07/16/24 1008   colchicine  tablet 0.6 mg  0.6 mg Oral Daily Dew, Jason S, MD   0.6 mg at 07/16/24 1009   dapagliflozin  propanediol (FARXIGA ) tablet 10 mg  10 mg Oral Daily Dew, Jason S, MD   10 mg at 07/16/24 1017   docusate sodium  (COLACE) capsule 100 mg  100 mg Oral BID Dew, Jason S, MD   100 mg at 07/16/24 1009   ezetimibe  (ZETIA ) tablet 10 mg  10 mg Oral Daily Lenon Elsie HERO, RPH   10 mg at 07/16/24 1009   gabapentin  (NEURONTIN ) capsule 300 mg  300 mg Oral BID Dew, Jason S, MD   300 mg at 07/16/24 1008   guaiFENesin -dextromethorphan  (ROBITUSSIN DM) 100-10 MG/5ML syrup 15 mL  15 mL Oral Q4H PRN Marea Selinda RAMAN, MD       hydrALAZINE  (APRESOLINE ) injection 5 mg  5 mg Intravenous  Q20 Min PRN Dew, Jason S, MD       labetalol  (NORMODYNE ) injection 10 mg  10 mg Intravenous Q10 min PRN Dew, Jason S, MD       magnesium  hydroxide (MILK OF MAGNESIA) suspension 30 mL  30 mL Oral Daily PRN Dew, Jason S, MD       metoprolol  succinate (TOPROL -XL) 24 hr tablet 12.5 mg  12.5 mg Oral Daily Dew, Jason S, MD   12.5 mg at 07/15/24 0941   metoprolol  tartrate (LOPRESSOR ) injection 2-5 mg  2-5 mg Intravenous Q2H PRN Dew, Jason S, MD       morphine  (PF) 2 MG/ML injection 2-5 mg  2-5 mg Intravenous Q1H PRN Dew, Jason S, MD   2 mg at 07/15/24 2105   ondansetron  (ZOFRAN ) injection 4 mg  4 mg Intravenous Q6H PRN Marea Selinda RAMAN, MD       oxyCODONE  (Oxy IR/ROXICODONE ) immediate release tablet 5 mg  5 mg Oral Q8H PRN Dew, Jason S, MD   5 mg at 07/16/24 9188   pantoprazole  (PROTONIX ) EC tablet 40 mg  40 mg Oral Daily Dew, Jason S, MD   40 mg at 07/16/24 1008   phenol (CHLORASEPTIC) mouth spray 1 spray  1 spray Mouth/Throat PRN Dew, Jason S,  MD       potassium chloride  SA (KLOR-CON  M) CR tablet 20-40 mEq  20-40 mEq Oral Once Marea Selinda RAMAN, MD         Objective: Vital: Vitals:   07/15/24 2136 07/15/24 2245 07/16/24 0453 07/16/24 0746  BP: (!) 88/59 95/66 (!) 86/64 94/65  Pulse: 85 75 80 82  Resp: 18  20 17   Temp: 98.4 F (36.9 C)  98.7 F (37.1 C) 98.2 F (36.8 C)  TempSrc:      SpO2: 99%   96%  Weight:      Height:       I/Os: I/O last 3 completed shifts: In: 1320 [P.O.:1320] Out: 2810 [Urine:2810]  Physical Exam:  General: Patient is in no apparent distress Lungs: Normal respiratory effort, chest expands symmetrically. GI: The abdomen is soft and nontender without mass. Left nephrostomy tube in place draining dark red urine Ext: lower extremities symmetric  Lab Results: Recent Labs    07/14/24 0500 07/15/24 0449  WBC 8.5 9.7  HGB 11.9* 12.2*  HCT 37.0* 38.2*   Recent Labs    07/14/24 0500 07/15/24 0449  NA 137 138  K 4.3 5.2*  CL 106 106  CO2 22 23  GLUCOSE 89 94   BUN 18 17  CREATININE 1.04 1.23  CALCIUM  8.6* 9.0   No results for input(s): LABPT, INR in the last 72 hours. No results for input(s): LABURIN in the last 72 hours. Results for orders placed or performed in visit on 06/16/24  Microscopic Examination     Status: Abnormal   Collection Time: 06/16/24  8:44 AM   Urine  Result Value Ref Range Status   WBC, UA 6-10 (A) 0 - 5 /hpf Final   RBC, Urine 11-30 (A) 0 - 2 /hpf Final   Epithelial Cells (non renal) 0-10 0 - 10 /hpf Final   Casts Present (A) None seen /lpf Final   Cast Type Hyaline casts N/A Final   Bacteria, UA Few None seen/Few Final    Studies/Results: No results found.  Assessment: Gross hematuria Left hydronephrosis status post nephrostomy tube placement Bladder tumor  Plan: Anticipate intermittent gross hematuria from the nephrostomy tube from irritation especially in the setting of blood thinners.  If it stops draining, nursing can irrigate/flush with a saline flush.  Anticipate intermittent gross hematuria from his bladder tumor on blood thinner as well.  This is okay as long as he is voiding.  Ultimately needs TURBT but unfortunately need to hold off until blood thinner can be stopped.  I will communicate with Dr. Donette that he is still here and therefore will not make his outpatient appointment Monday.   Sherwood Edison, MD Urology 07/16/2024, 12:34 PM

## 2024-07-17 LAB — COMPREHENSIVE METABOLIC PANEL WITH GFR
ALT: 10 U/L (ref 0–44)
AST: 21 U/L (ref 15–41)
Albumin: 3.8 g/dL (ref 3.5–5.0)
Alkaline Phosphatase: 114 U/L (ref 38–126)
Anion gap: 9 (ref 5–15)
BUN: 27 mg/dL — ABNORMAL HIGH (ref 8–23)
CO2: 23 mmol/L (ref 22–32)
Calcium: 10.3 mg/dL (ref 8.9–10.3)
Chloride: 105 mmol/L (ref 98–111)
Creatinine, Ser: 1.37 mg/dL — ABNORMAL HIGH (ref 0.61–1.24)
GFR, Estimated: 58 mL/min — ABNORMAL LOW
Glucose, Bld: 101 mg/dL — ABNORMAL HIGH (ref 70–99)
Potassium: 4.5 mmol/L (ref 3.5–5.1)
Sodium: 137 mmol/L (ref 135–145)
Total Bilirubin: 0.5 mg/dL (ref 0.0–1.2)
Total Protein: 7 g/dL (ref 6.5–8.1)

## 2024-07-17 LAB — CBC
HCT: 39.6 % (ref 39.0–52.0)
Hemoglobin: 12.8 g/dL — ABNORMAL LOW (ref 13.0–17.0)
MCH: 30.6 pg (ref 26.0–34.0)
MCHC: 32.3 g/dL (ref 30.0–36.0)
MCV: 94.7 fL (ref 80.0–100.0)
Platelets: 132 K/uL — ABNORMAL LOW (ref 150–400)
RBC: 4.18 MIL/uL — ABNORMAL LOW (ref 4.22–5.81)
RDW: 16.5 % — ABNORMAL HIGH (ref 11.5–15.5)
WBC: 8.3 K/uL (ref 4.0–10.5)
nRBC: 0 % (ref 0.0–0.2)

## 2024-07-17 NOTE — TOC Initial Note (Signed)
 Transition of Care [facility]) - Initial/Assessment Note    Patient Details  Name: Cameron [name] MRN: [id number] Date of Birth: March 18, 1961  [facility] CM/SW Contact:    Victory Jackquline RAMAN, RN Phone Number: 07/17/2024, 2:04 PM  Clinical Narrative:                 RNCM spoke to patient, introduced myself and my role and explained that discharge planning would be discussed.PT is recommending STR. Patient is in agreement with STR and would like for me to submit referral's for bed offers and once they come in review them with him. He would like to stay within the [place] and [place] area. Bed Offers Pending. FL2 completed and PASRR# [id number]. RNCM will continue to follow for discharge planning needs.   Expected Discharge Plan: Skilled Nursing Facility Barriers to Discharge: Continued Medical Work up   Patient Goals and CMS Choice            Expected Discharge Plan and Services       Living arrangements for the past 2 months: Single Family Home                                      Prior Living Arrangements/Services Living arrangements for the past 2 months: Single Family Home Lives with:: Self Patient language and need for interpreter reviewed:: Yes Do you feel safe going back to the place where you live?: Yes      Need for Family Participation in Patient Care: Yes (Comment) Care giver support system in place?: Yes (comment)   Criminal Activity/Legal Involvement Pertinent to Current Situation/Hospitalization: No - Comment as needed  Activities of Daily Living   ADL Screening (condition at time of admission) Independently performs ADLs?: Yes (appropriate for developmental age) Is the patient deaf or have difficulty hearing?: No Does the patient have difficulty seeing, even when wearing glasses/contacts?: No Does the patient have difficulty concentrating, remembering, or making decisions?: No  Permission Sought/Granted                  Emotional  Assessment Appearance:: Appears stated age, Well-Groomed Attitude/Demeanor/Rapport: Engaged, Gracious Affect (typically observed): Calm, Accepting, Quiet, Pleasant Orientation: : Oriented to Self, Oriented to Place, Oriented to  Time, Oriented to Situation Alcohol / Substance Use: Not Applicable    Admission diagnosis:  Atherosclerosis of artery of extremity with rest pain (HCC) [I70.229] Patient Active Problem List   Diagnosis Date Noted   Atherosclerosis of artery of extremity with rest pain (HCC) 07/11/2024   Ischemia of foot 03/10/2023   HFrEF (heart failure with reduced ejection fraction) (HCC) 03/10/2023   Limb ischemia 03/10/2023   Gross hematuria 07/25/2022   Hemorrhoids 07/25/2022   Acute calculous cholecystitis 03/31/2021   Dry gangrene (HCC)    Cellulitis of left foot 08/17/2020   Pressure injury of skin 07/24/2020   Acute gouty arthritis 07/24/2020   Chronic systolic CHF (congestive heart failure) (HCC) 07/24/2020   Atrial fibrillation, chronic (HCC) 07/24/2020   Iron deficiency anemia 07/24/2020   Leukocytosis 07/24/2020   CKD (chronic kidney disease), stage II    Hypotension    Acute on chronic respiratory failure with hypoxia (HCC)    PVD (peripheral vascular disease)    Acute renal failure superimposed on stage 3a chronic kidney disease (HCC)    Macrocytic anemia    Atherosclerosis of native arteries of extremity with rest pain (HCC)  06/19/2020   Acute on chronic systolic CHF (congestive heart failure) (HCC) 02/17/2020   Hypokalemia 02/17/2020   Hypomagnesemia 02/17/2020   Elevated troponin 02/17/2020   New onset atrial fibrillation (HCC) 02/17/2020   Olecranon bursitis of left elbow 02/17/2020   NSVT (nonsustained ventricular tachycardia) (HCC) 02/17/2020   Tobacco abuse 02/17/2020   Mural thrombus of atrium 02/17/2020   Gout_left elbow 02/17/2020   Bilateral leg edema 10/19/2018   Weight gain 10/19/2018   Chest pain 02/02/2018   Acute pain of right knee  03/13/2017   Essential hypertension 06/20/2016   Hyperlipidemia 06/20/2016   CAD (coronary artery disease), native coronary artery 06/20/2016   STEMI involving oth coronary artery of anterior wall (HCC) 06/17/2016   STEMI (ST elevation myocardial infarction) (HCC) 06/17/2016   PCP:  Lars Rea BROCKS, NP Pharmacy:   [facility] [address], [place] - [address] [place] [address] Phone: [phone] Fax: [phone]     Social Drivers of Health (SDOH) Social History: SDOH Screenings   Food Insecurity: Food Insecurity Present (07/11/2024)  Housing: Low Risk (07/11/2024)  Transportation Needs: No Transportation Needs (07/11/2024)  Utilities: At Risk (07/11/2024)  Financial Resource Strain: Low Risk  (03/24/2024)   Received from [facility]  Social Connections: Socially Isolated (07/11/2024)  Tobacco Use: High Risk (07/13/2024)   SDOH Interventions: Food Insecurity Interventions: Inpatient TOC, Other (Comment) (resources added to avs) Utilities Interventions: Inpatient TOC, Other (Comment) (resources added to avs) Social Connections Interventions: Intervention Not Indicated, Inpatient TOC   Readmission Risk Interventions     No data to display

## 2024-07-17 NOTE — Progress Notes (Signed)
 Mobility Specialist - Progress Note   07/17/24 1500  Mobility  Activity Ambulated with assistance;Respositioned in chair  Level of Assistance Independent after set-up  Assistive Device Front wheel walker  Distance Ambulated (ft) 25 ft  Range of Motion/Exercises Active  Activity Response Tolerated well  Mobility visit 1 Mobility  Mobility Specialist Start Time (ACUTE ONLY) 1503  Mobility Specialist Stop Time (ACUTE ONLY) 1533  Mobility Specialist Time Calculation (min) (ACUTE ONLY) 30 min   Pt was in the recliner on RA upon entry. Pt agreed to mobility. Pt is able today to STS independently with 2 WW. Pt ambulated well. Pt did state a bit of pain in the LE. After activity pt repositioned in the recliner with needs in reach.  Cameron [name] Mobility Specialist 07/17/2024, 3:59 PM

## 2024-07-17 NOTE — Progress Notes (Signed)
 4 Days Post-Op   Subjective/Chief Complaint: Feeling better this morning. Able to stand and bear more weight on LEFT leg- standing upon exam this morning. States soreness has in groin. Foot feels better.   Objective: Vital signs in last 24 hours: Temp:  [97.9 F (36.6 C)-98.5 F (36.9 C)] 97.9 F (36.6 C) (01/11 0724) Pulse Rate:  [80-89] 81 (01/11 0724) Resp:  [16-20] 20 (01/11 0724) BP: (90-119)/(63-76) 119/76 (01/11 0724) SpO2:  [98 %-100 %] 98 % (01/11 0724) Last BM Date : 07/11/24  Intake/Output from previous day: 01/10 0701 - 01/11 0700 In: 720 [P.O.:720] Out: -  Intake/Output this shift: No intake/output data recorded.  General appearance: alert and no distress Cardio: regular rate and rhythm Extremities: LEFT groin incision- C/D/I, soft, mild swelling and medial thigh erythema, leg warm, calf soft, incision- C/D/I,foot with good cap refill  Lab Results:  Recent Labs    07/15/24 0449  WBC 9.7  HGB 12.2*  HCT 38.2*  PLT 96*   BMET Recent Labs    07/15/24 0449  NA 138  K 5.2*  CL 106  CO2 23  GLUCOSE 94  BUN 17  CREATININE 1.23  CALCIUM  9.0   PT/INR No results for input(s): LABPROT, INR in the last 72 hours. ABG No results for input(s): PHART, HCO3 in the last 72 hours.  Invalid input(s): PCO2, PO2  Studies/Results: No results found.  Anti-infectives: Anti-infectives (From admission, onward)    Start     Dose/Rate Route Frequency Ordered Stop   07/13/24 1720  gentamicin  (GARAMYCIN ) injection  Status:  Discontinued          As needed 07/13/24 1721 07/13/24 1744   07/13/24 1710  vancomycin  (VANCOCIN ) powder  Status:  Discontinued          As needed 07/13/24 1721 07/13/24 1744   07/13/24 1054  ceFAZolin  (ANCEF ) IVPB 2g/100 mL premix        2 g 200 mL/hr over 30 Minutes Intravenous 30 min pre-op 07/13/24 1054 07/13/24 1605   07/12/24 0930  cefTRIAXone  (ROCEPHIN ) 2 g in sodium chloride  0.9 % 100 mL IVPB        over 30 Minutes   Continuous PRN 07/12/24 0930 07/12/24 0930   07/12/24 0912  cefTRIAXone  (ROCEPHIN ) 2 g in sodium chloride  0.9 % 100 mL IVPB  Status:  Discontinued        2 g 200 mL/hr over 30 Minutes Intravenous 30 min pre-op 07/12/24 0912 07/13/24 1912   07/11/24 0720  ceFAZolin  (ANCEF ) IVPB 2g/100 mL premix        2 g 200 mL/hr over 30 Minutes Intravenous 30 min pre-op 07/11/24 0721 07/11/24 0855       Assessment/Plan: s/p Procedures: ENDARTERECTOMY, FEMORAL (Left) APPLICATION OF CELL SAVER (N/A) THROMBOENDARTERECTOMY, TIBIOPERONEAL TRUNK CREATION, BYPASS, ARTERIAL, FEMORAL TO TIBIAL, USING GRAFT (Left) POD #4  Will check CBC/CMP this morning Continue OOB- ambulate as tolerated May need a day or so more in house with PT  LOS: 6 days    Tisa Dakin A 07/17/2024

## 2024-07-17 NOTE — NC FL2 (Signed)
 " [place]  MEDICAID FL2 LEVEL OF CARE FORM     IDENTIFICATION  Patient Name: Cameron [name] Birthdate: 01/22/1961 Sex: male Admission Date (Current Location): 07/11/2024  [facility] and [place] Number:  Chiropodist and Address:         Provider Number: [id number]  Attending Physician Name and Address:  Marea Selinda RAMAN, MD  Relative Name and Phone Number:       Current Level of Care: Hospital Recommended Level of Care: Skilled Nursing Facility Prior Approval Number:    Date Approved/Denied:   PASRR Number: [id number]  Discharge Plan:      Current Diagnoses: Patient Active Problem List   Diagnosis Date Noted   Atherosclerosis of artery of extremity with rest pain (HCC) 07/11/2024   Ischemia of foot 03/10/2023   HFrEF (heart failure with reduced ejection fraction) (HCC) 03/10/2023   Limb ischemia 03/10/2023   Gross hematuria 07/25/2022   Hemorrhoids 07/25/2022   Acute calculous cholecystitis 03/31/2021   Dry gangrene (HCC)    Cellulitis of left foot 08/17/2020   Pressure injury of skin 07/24/2020   Acute gouty arthritis 07/24/2020   Chronic systolic CHF (congestive heart failure) (HCC) 07/24/2020   Atrial fibrillation, chronic (HCC) 07/24/2020   Iron deficiency anemia 07/24/2020   Leukocytosis 07/24/2020   CKD (chronic kidney disease), stage II    Hypotension    Acute on chronic respiratory failure with hypoxia (HCC)    PVD (peripheral vascular disease)    Acute renal failure superimposed on stage 3a chronic kidney disease (HCC)    Macrocytic anemia    Atherosclerosis of native arteries of extremity with rest pain (HCC) 06/19/2020   Acute on chronic systolic CHF (congestive heart failure) (HCC) 02/17/2020   Hypokalemia 02/17/2020   Hypomagnesemia 02/17/2020   Elevated troponin 02/17/2020   New onset atrial fibrillation (HCC) 02/17/2020   Olecranon bursitis of left elbow 02/17/2020   NSVT (nonsustained ventricular tachycardia) (HCC) 02/17/2020    Tobacco abuse 02/17/2020   Mural thrombus of atrium 02/17/2020   Gout_left elbow 02/17/2020   Bilateral leg edema 10/19/2018   Weight gain 10/19/2018   Chest pain 02/02/2018   Acute pain of right knee 03/13/2017   Essential hypertension 06/20/2016   Hyperlipidemia 06/20/2016   CAD (coronary artery disease), native coronary artery 06/20/2016   STEMI involving oth coronary artery of anterior wall (HCC) 06/17/2016   STEMI (ST elevation myocardial infarction) (HCC) 06/17/2016    Orientation RESPIRATION BLADDER Height & Weight     Self, Time, Situation, Place  Normal Continent Weight: 76.9 kg Height:  5' 7 (170.2 cm)  BEHAVIORAL SYMPTOMS/MOOD NEUROLOGICAL BOWEL NUTRITION STATUS      Continent Diet  AMBULATORY STATUS COMMUNICATION OF NEEDS Skin   Limited Assist Verbally Surgical wounds                       Personal Care Assistance Level of Assistance  Dressing     Dressing Assistance: Limited assistance     Functional Limitations Info             SPECIAL CARE FACTORS FREQUENCY  PT (By licensed PT), OT (By licensed OT)     PT Frequency: 3X/Week OT Frequency: 3X/Week            Contractures Contractures Info: Not present    Additional Factors Info  Code Status Code Status Info: Full             Current Medications (07/17/2024):  This is the  current hospital active medication list Current Facility-Administered Medications  Medication Dose Route Frequency Provider Last Rate Last Admin   acetaminophen  (TYLENOL ) tablet 650 mg  650 mg Oral Q6H PRN Dew, Jason S, MD       alum & mag hydroxide-simeth (MAALOX/MYLANTA) 200-200-20 MG/5ML suspension 15-30 mL  15-30 mL Oral Q2H PRN Dew, Jason S, MD       apixaban  (ELIQUIS ) tablet 5 mg  5 mg Oral BID Hudson, Caralyn, PA-C   5 mg at 07/17/24 1012   aspirin  EC tablet 81 mg  81 mg Oral Daily Dew, Jason S, MD   81 mg at 07/17/24 1012   atorvastatin  (LIPITOR ) tablet 80 mg  80 mg Oral Daily Dew, Jason S, MD   80 mg at  07/17/24 1011   colchicine  tablet 0.6 mg  0.6 mg Oral Daily Dew, Jason S, MD   0.6 mg at 07/17/24 1011   dapagliflozin  propanediol (FARXIGA ) tablet 10 mg  10 mg Oral Daily Dew, Jason S, MD   10 mg at 07/17/24 1011   docusate sodium  (COLACE) capsule 100 mg  100 mg Oral BID Dew, Jason S, MD   100 mg at 07/17/24 1012   ezetimibe  (ZETIA ) tablet 10 mg  10 mg Oral Daily Lenon Elsie HERO, RPH   10 mg at 07/17/24 1011   gabapentin  (NEURONTIN ) capsule 300 mg  300 mg Oral BID Dew, Jason S, MD   300 mg at 07/17/24 1012   guaiFENesin -dextromethorphan  (ROBITUSSIN DM) 100-10 MG/5ML syrup 15 mL  15 mL Oral Q4H PRN Dew, Jason S, MD       hydrALAZINE  (APRESOLINE ) injection 5 mg  5 mg Intravenous Q20 Min PRN Dew, Jason S, MD       labetalol  (NORMODYNE ) injection 10 mg  10 mg Intravenous Q10 min PRN Dew, Jason S, MD       magnesium  hydroxide (MILK OF MAGNESIA) suspension 30 mL  30 mL Oral Daily PRN Dew, Jason S, MD       metoprolol  succinate (TOPROL -XL) 24 hr tablet 12.5 mg  12.5 mg Oral Daily Dew, Jason S, MD   12.5 mg at 07/17/24 1012   metoprolol  tartrate (LOPRESSOR ) injection 2-5 mg  2-5 mg Intravenous Q2H PRN Dew, Jason S, MD       morphine  (PF) 2 MG/ML injection 1 mg  1 mg Intravenous Q4H PRN Esco, Miechia A, MD       ondansetron  (ZOFRAN ) injection 4 mg  4 mg Intravenous Q6H PRN Dew, Jason S, MD       oxyCODONE  (Oxy IR/ROXICODONE ) immediate release tablet 5 mg  5 mg Oral Q6H PRN Esco, Miechia A, MD   5 mg at 07/17/24 1228   pantoprazole  (PROTONIX ) EC tablet 40 mg  40 mg Oral Daily Dew, Jason S, MD   40 mg at 07/17/24 1012   phenol (CHLORASEPTIC) mouth spray 1 spray  1 spray Mouth/Throat PRN Marea Selinda RAMAN, MD         Discharge Medications: Please see discharge summary for a list of discharge medications.  Relevant Imaging Results:  Relevant Lab Results:   Additional Information: [id number]    Victory Jackquline RAMAN, RN     "

## 2024-07-17 NOTE — Plan of Care (Signed)
" °  Problem: Education: Goal: Knowledge of General Education information will improve Description: Including pain rating scale, medication(s)/side effects and non-pharmacologic comfort measures Outcome: Progressing   Problem: Health Behavior/Discharge Planning: Goal: Ability to manage health-related needs will improve Outcome: Progressing   Problem: Clinical Measurements: Goal: Ability to maintain clinical measurements within normal limits will improve Outcome: Progressing Goal: Will remain free from infection Outcome: Progressing Goal: Diagnostic test results will improve Outcome: Progressing Goal: Cardiovascular complication will be avoided Outcome: Progressing   Problem: Activity: Goal: Risk for activity intolerance will decrease Outcome: Progressing   Problem: Nutrition: Goal: Adequate nutrition will be maintained Outcome: Progressing   Problem: Coping: Goal: Level of anxiety will decrease Outcome: Progressing   Problem: Elimination: Goal: Will not experience complications related to bowel motility Outcome: Progressing Goal: Will not experience complications related to urinary retention Outcome: Progressing   Problem: Pain Managment: Goal: General experience of comfort will improve and/or be controlled Outcome: Progressing   Problem: Safety: Goal: Ability to remain free from injury will improve Outcome: Progressing   Problem: Skin Integrity: Goal: Risk for impaired skin integrity will decrease Outcome: Progressing   Problem: Education: Goal: Knowledge of the prescribed therapeutic regimen will improve Outcome: Progressing   Problem: Bowel/Gastric: Goal: Gastrointestinal status for postoperative course will improve Outcome: Progressing   Problem: Cardiac: Goal: Ability to maintain an adequate cardiac output Outcome: Progressing Goal: Will show no evidence of cardiac arrhythmias Outcome: Progressing   Problem: Nutritional: Goal: Will attain and maintain  optimal nutritional status Outcome: Progressing   Problem: Neurological: Goal: Will regain or maintain usual level of consciousness Outcome: Progressing   Problem: Clinical Measurements: Goal: Ability to maintain clinical measurements within normal limits Outcome: Progressing Goal: Postoperative complications will be avoided or minimized Outcome: Progressing   Problem: Respiratory: Goal: Will regain and/or maintain adequate ventilation Outcome: Progressing Goal: Respiratory status will improve Outcome: Progressing   Problem: Skin Integrity: Goal: Demonstrates signs of wound healing without infection Outcome: Progressing   Problem: Urinary Elimination: Goal: Will remain free from infection Outcome: Progressing Goal: Ability to achieve and maintain adequate urine output Outcome: Progressing   "

## 2024-07-18 ENCOUNTER — Ambulatory Visit: Admitting: Urology

## 2024-07-18 NOTE — Progress Notes (Signed)
 Mobility Specialist Progress Note:    07/18/24 1342  Mobility  Activity Ambulated with assistance  Level of Assistance Standby assist, set-up cues, supervision of patient - no hands on  Assistive Device Front wheel walker  Distance Ambulated (ft) 200 ft  Range of Motion/Exercises Active;All extremities  Activity Response Tolerated well  Mobility visit 1 Mobility  Mobility Specialist Start Time (ACUTE ONLY) 1321  Mobility Specialist Stop Time (ACUTE ONLY) 1338  Mobility Specialist Time Calculation (min) (ACUTE ONLY) 17 min   Pt received ambulating, agreeable to further mobility. Required SBA to ambulate with RW. Tolerated well, asx throughout. Returned to room, left pt in chair. All needs met.  Sherrilee Ditty Mobility Specialist Please contact via Special Educational Needs Teacher or  Rehab office at [phone]

## 2024-07-18 NOTE — Progress Notes (Signed)
 [facility]  Daily Progress Note   Subjective  -   Feeling better.  Able to stand and bear more weight on the left lower extremity.  Still has some discomfort with extended walking activities  Objective Vitals:   07/17/24 1227 07/18/24 0246 07/18/24 0518 07/18/24 0945  BP: 109/74 94/65 92/61  104/67  Pulse: 77 75 81 73  Resp: 18 18 16 18   Temp: 97.6 F (36.4 C) 97.8 F (36.6 C) 98.4 F (36.9 C) 98 F (36.7 C)  TempSrc: Oral  Oral Oral  SpO2: 99% 99% 98% 100%  Weight:      Height:        Intake/Output Summary (Last 24 hours) at 07/18/2024 1416 Last data filed at 07/17/2024 1856 Gross per 24 hour  Intake 920 ml  Output --  Net 920 ml    PULM  CTAB CV  RRR VASC  good cap refill with warm left foot.  1+ edema left lower extremity  Laboratory CBC    Component Value Date/Time   WBC 8.3 07/17/2024 1036   HGB 12.8 (L) 07/17/2024 1036   HGB 10.6 (L) 03/26/2014 1232   HCT 39.6 07/17/2024 1036   HCT 32.1 (L) 03/26/2014 1232   PLT 132 (L) 07/17/2024 1036   PLT 137 (L) 03/26/2014 1232    BMET    Component Value Date/Time   NA 137 07/17/2024 1036   NA 139 03/28/2014 0823   K 4.5 07/17/2024 1036   K 3.4 (L) 03/28/2014 0823   CL 105 07/17/2024 1036   CL 111 (H) 03/28/2014 0823   CO2 23 07/17/2024 1036   CO2 22 03/28/2014 0823   GLUCOSE 101 (H) 07/17/2024 1036   GLUCOSE 106 (H) 03/28/2014 0823   BUN 27 (H) 07/17/2024 1036   BUN 8 03/28/2014 0823   CREATININE 1.37 (H) 07/17/2024 1036   CREATININE 0.97 03/28/2014 0823   CALCIUM  10.3 07/17/2024 1036   CALCIUM  7.8 (L) 03/28/2014 0823   GFRNONAA 58 (L) 07/17/2024 1036   GFRNONAA >60 03/28/2014 0823   GFRAA >60 02/21/2020 0630   GFRAA >60 03/28/2014 0823    Assessment/Planning: POD #5 s/p left fem-distal bypass, multiple endarterectomies   Continue out of bed and ambulate as tolerated Discussed with patient that swelling is normal especially since he is active and his legs are more  dependent Notified him of follow-up with urology on Friday instead of today SNF bed search currently underway  Cameron [name] Daring  07/18/2024, 2:16 PM

## 2024-07-18 NOTE — Progress Notes (Signed)
 Occupational Therapy Treatment Patient Details Name: Cameron [name] MRN: [id number] DOB: [birth year] Today's Date: 07/18/2024   History of present illness 64 y.o. male  with a past medical history of chronic systolic CHF with apical akinesis with an EF of 20%, coronary artery disease with STEMI in 2017 with PCI and stent to LAD, paroxysmal atrial fibrillation, NSVT, apical mural thrombus, hypertension, hyperlipidemia who presented on 07/11/2024 for outpatient peripheral vascular catheterization. Now s/p left femoral to distal bypass, left tibial endarterectomy, common femoral/SFA endarterectomy, and separate and distinct profunda femoris endarterectomy.   OT comments  Pt is seated in recliner on arrival. Pleasant and agreeable to OT session. He reports mild burning pain to L groin region, but tolerates session well. Pt performed STS from recliner with supervision and ambulated ~10 ft x2 bouts during session using RW with SBA. He emptied urinary drain standing over toilet with CGA/SBA and performed standing ADLs including UB/LB bathing and dressing tasks with occasional unilateral support on sink with Min A for LB management in hard to reach areas, otherwise SBA.  Pt returned to recliner with all needs in place and son present at end of session. He will cont to require skilled acute OT services to maximize his safety and IND to return to PLOF.       If plan is discharge home, recommend the following:  A little help with walking and/or transfers;A little help with bathing/dressing/bathroom;Assistance with cooking/housework;Help with stairs or ramp for entrance;Assist for transportation   Equipment Recommendations  Other (comment) (defer to next venue)    Recommendations for Other Services      Precautions / Restrictions Precautions Precautions: Fall Recall of Precautions/Restrictions: Intact Restrictions Weight Bearing Restrictions Per Provider Order: No       Mobility Bed Mobility                General bed mobility comments: NT up in recliner pre/post session    Transfers Overall transfer level: Needs assistance Equipment used: Rolling walker (2 wheels) Transfers: Sit to/from Stand Sit to Stand: Supervision           General transfer comment: relies on UE use to stand from recliner, occasional unilateral support on grab bar or sink in bathroom during standing tasks     Balance Overall balance assessment: Modified Independent                                         ADL either performed or assessed with clinical judgement   ADL Overall ADL's : Needs assistance/impaired         Upper Body Bathing: Contact guard assist;Standing Upper Body Bathing Details (indicate cue type and reason): at sink in bathroom with unilateral support on sink Lower Body Bathing: Minimal assistance;Sit to/from stand Lower Body Bathing Details (indicate cue type and reason): standing at sink in bathroom with unilateral support and Min A for buttocks and below the knee bathing Upper Body Dressing : Supervision/safety;Standing Upper Body Dressing Details (indicate cue type and reason): doff/donn gowns Lower Body Dressing: Supervision/safety;Sit to/from stand Lower Body Dressing Details (indicate cue type and reason): doffed boxer briefs while standing at sink with unilateral support     Toileting- Clothing Manipulation and Hygiene: Contact guard assist Toileting - Clothing Manipulation Details (indicate cue type and reason): standing over toilet to empty nephrostomy tube into urinal       General ADL Comments: Pt  ambulates with RW with supervision ~20 ft during session to empty urinary drain and perform bathing/dressing tasks standing at sink in bathroom with CGA/SBA.    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[employer], [employer] Communication: No apparent difficulties   Cognition Arousal:  Alert Behavior During Therapy: WFL for tasks assessed/performed                                 Following commands: Intact        Cueing   Cueing Techniques: Verbal cues  Exercises      Shoulder Instructions       General Comments mild burning pain to L groin, tolerated session well despite pain    Pertinent Vitals/ Pain       Pain Assessment Pain Assessment: Faces Faces Pain Scale: Hurts little more Pain Location: LLE, groin Pain Descriptors / Indicators: Burning, Discomfort Pain Intervention(s): Limited activity within patient's tolerance, Monitored during session, Repositioned  Home Living                                          Prior Functioning/Environment              Frequency  Min 2X/week        Progress Toward Goals  OT Goals(current goals can now be found in the care plan section)  Progress towards OT goals: Progressing toward goals  Acute Rehab OT Goals Patient Stated Goal: improve pain and strength OT Goal Formulation: With patient Time For Goal Achievement: 07/29/24 Potential to Achieve Goals: Fair  Plan      Co-evaluation                 AM-PAC OT 6 Clicks Daily Activity     Outcome Measure   Help from another person eating meals?: None Help from another person taking care of personal grooming?: None Help from another person toileting, which includes using toliet, bedpan, or urinal?: A Little Help from another person bathing (including washing, rinsing, drying)?: A Little Help from another person to put on and taking off regular upper body clothing?: A Little Help from another person to put on and taking off regular lower body clothing?: A Little 6 Click Score: 20    End of Session Equipment Utilized During Treatment: Rolling walker (2 wheels)  OT Visit Diagnosis: Unsteadiness on feet (R26.81);Repeated falls (R29.6);Muscle weakness (generalized) (M62.81)   Activity Tolerance Patient  tolerated treatment well   Patient Left with call bell/phone within reach;in chair;with chair alarm set;with family/visitor present   Nurse Communication Mobility status        Time: 8876-8848 OT Time Calculation (min): 28 min  Charges: OT General Charges $OT Visit: 1 Visit OT Treatments $Self Care/Home Management : 23-37 mins  Yunior Jain Chrismon, OTR/L  07/18/2024, 1:31 PM   Brannon Decaire E Chrismon 07/18/2024, 1:29 PM

## 2024-07-18 NOTE — Progress Notes (Signed)
 Mobility Specialist Progress Note:    07/18/24 1626  Mobility  Activity Ambulated with assistance  Level of Assistance Standby assist, set-up cues, supervision of patient - no hands on  Assistive Device Front wheel walker  Distance Ambulated (ft) 200 ft  Range of Motion/Exercises Active;All extremities  Activity Response Tolerated well  Mobility visit 1 Mobility  Mobility Specialist Start Time (ACUTE ONLY) 1604  Mobility Specialist Stop Time (ACUTE ONLY) 1626  Mobility Specialist Time Calculation (min) (ACUTE ONLY) 22 min   Pt received in bed, agreeable to mobility. Required SBA to stand and ambulate with RW. Tolerated well, c/o LLE stiffness during session. Returned to room, left pt sitting EOB with nurse. All needs met.  Sherrilee Ditty Mobility Specialist Please contact via Special Educational Needs Teacher or  Rehab office at [phone]

## 2024-07-18 NOTE — TOC Progression Note (Addendum)
 Transition of Care [facility]) - Progression Note    Patient Details  Name: Cameron [name] MRN: [id number] Date of Birth: 1961/06/11  [facility] CM/SW Contact  Corean ONEIDA Haddock, RN Phone Number: 07/18/2024, 9:28 AM  Clinical Narrative:     No SNF bed offers.  Bed Search extended    347 pm - met with patient at bedside.  Presented bed offers and CMS Medicare.gov Compare Post Acute Care list reviewed with patient Patient accepts bed at [facility].  Accepted bed in HUB and notified Brittany with [facility].  Requested her to initiated auth  Expected Discharge Plan: Skilled Nursing Facility Barriers to Discharge: Continued Medical Work up               Expected Discharge Plan and Services       Living arrangements for the past 2 months: Single Family Home                                       Social Drivers of Health (SDOH) Interventions SDOH Screenings   Food Insecurity: Food Insecurity Present (07/11/2024)  Housing: Low Risk (07/11/2024)  Transportation Needs: No Transportation Needs (07/11/2024)  Utilities: At Risk (07/11/2024)  Financial Resource Strain: Low Risk  (03/24/2024)   Received from [facility]  Social Connections: Socially Isolated (07/11/2024)  Tobacco Use: High Risk (07/13/2024)    Readmission Risk Interventions     No data to display

## 2024-07-18 NOTE — Progress Notes (Signed)
" ° °  07/22/2024 11:14 AM   Cameron [name] 02-07-1961 [id number]  Reason for visit: Follow up bladder mass   HPI: 64 y.o. male, follow up with me today  Admitted 1/5/6 with critical limb ischemia, s/p bypass  - IR PCN placed 07/13/23  - needs TURBT for bladder mass, postponed due to 3 mo anticoag status   Prior HPI: ED visit (05/21/24) - gross hematuria with clots   - CT stone = moderate density intravesical material, likely clot burden. No nephrolithiasis, no hydro  CTU (07/02/24) - 3.8 cm Left lateral bladder wall mass with left hydronephrosis   Has never seen a urologist Denies prior history of GU conditions, GU surgeries, prostatitis, nephrolithiasis Current lifelong smoker, pack a day >40-50 pack years No family history of bladder cancer Today, no urinary complaints, urine has been clear, denies LUTS   Hx of prior GH in 2024 - attributed to UTI Hx of prior nephrolithiasis   Significant cardiovascular hx: CHF, BLE edema, CAD, A-fib, STEMI, PVD Also limb ischemia, gangrene, CKD    Physical Exam: BP 116/71   Pulse 78   Ht 5' 7 (1.702 m)   Wt 173 lb 9.6 oz (78.7 kg)   BMI 27.19 kg/m    General: no acute distress, alert/oriented, conversational  HEENT: equal nondilated pupils CV: regular rate GU: Left PCN dressing c/d/I. Tubing with light pink urine, draining freely.  Lung: unlabored breathing, regular rate and rhythm  Abd: nondistended, nontender with palpation, no palpable masses  MSK: moving all extremities without issue, normal observed motor function   Laboratory Data: N/A  Pertinent Imaging: N/A    Assessment & Plan:    Bladder mass Assessment & Plan: CTU (07/02/24) - 3.8 cm Left lateral bladder wall mass with left hydronephrosis  Plan cystoscopy/TURBT postponed d/t acute limb ischemia/re-vascularization Now on ~87mo anticoagulation  - We need to proceed with TURBT once he is safe to temporarily hold anticoagulation  - Need clearance from Dr.  [facility] vascular surgery on when this is safe  -Explained the importance of resection, confirmation histology--high likelihood of locally advanced bladder cancer - RTC to see me first week Mar 2026 - expect we will discuss pre-op / surgical date near that time frame   Hydronephrosis of left kidney Assessment & Plan: Left hydroureteronephrosis  - secondary to cT3 bladder mass   - s/p IR PCN 07/12/24  - Will remain indefinitely-pending overall bladder cancer diagnosis /plan -Recommend routine exchange within ~2-3 months, ~due ~Feb/Mar 2026 - Will re-refer to IR to ensure he is on schedule  Orders: -     Ambulatory referral to Interventional Radiology       Penne JONELLE Skye, MD  [facility] [address] [place], [place] [address] [phone] "

## 2024-07-18 NOTE — Progress Notes (Addendum)
 Physical Therapy Treatment Patient Details Name: Cameron [name] MRN: [id number] DOB: [birth year] Today's Date: 07/18/2024   History of Present Illness 64 y.o. male  with a past medical history of chronic systolic CHF with apical akinesis with an EF of 20%, coronary artery disease with STEMI in 2017 with PCI and stent to LAD, paroxysmal atrial fibrillation, NSVT, apical mural thrombus, hypertension, hyperlipidemia who presented on 07/11/2024 for outpatient peripheral vascular catheterization. Now s/p left femoral to distal bypass, left tibial endarterectomy, common femoral/SFA endarterectomy, and separate and distinct profunda femoris endarterectomy.    PT Comments  Pt with inc edema noted LE which has been there for the past few days.  Stated MD's are aware.  He is able to get up and complete x 1 lap today with RW and cga/supervision.  Generally steady but does use BUE's heavily to off load LLE with WB.  Palms red after gait.  He keeps LLE externally rotated with little to no knee flexion with steps.  Encouraged to correct but he is challenged due to pain.  Opts to stay in chair where he is able to flex knee about 80 degrees and is encouraged to continue working on knee flexion.    Discussed discharge plan.  Stated he lives alone with little to no outside help and would like to continue with rehab at this time.  If rehab is not available to pt, HHPT and services would be appropriate.  Asked mobility team continue to see pt as able to increase mobility.    If plan is discharge home, recommend the following: A little help with walking and/or transfers;A little help with bathing/dressing/bathroom;Assistance with cooking/housework;Assist for transportation;Help with stairs or ramp for entrance   Can travel by private vehicle        Equipment Recommendations       Recommendations for Other Services       Precautions / Restrictions Precautions Precautions: Fall Recall of Precautions/Restrictions:  Intact Restrictions Weight Bearing Restrictions Per Provider Order: No     Mobility  Bed Mobility Overal bed mobility: Modified Independent       Supine to sit: Used rails       Patient Response: Cooperative  Transfers Overall transfer level: Needs assistance Equipment used: Rolling walker (2 wheels)   Sit to Stand: Supervision                Ambulation/Gait Ambulation/Gait assistance: Contact guard assist Gait Distance (Feet): 200 Feet Assistive device: Rolling walker (2 wheels)         General Gait Details: pauses for hand relief; heavy UE support on RW for antalgia   Stairs             Wheelchair Mobility     Tilt Bed Tilt Bed Patient Response: Cooperative  Modified Rankin (Stroke Patients Only)       Balance Overall balance assessment: Modified Independent                                          Communication Communication Communication: No apparent difficulties  Cognition Arousal: Alert Behavior During Therapy: WFL for tasks assessed/performed   PT - Cognitive impairments: No apparent impairments                       PT - Cognition Comments: talkative Following commands: Intact      Cueing Cueing Techniques:  Verbal cues  Exercises      General Comments        Pertinent Vitals/Pain Pain Assessment Pain Assessment: Faces Faces Pain Scale: Hurts little more Pain Location: LLE, groin Pain Descriptors / Indicators: Burning, Discomfort Pain Intervention(s): Limited activity within patient's tolerance, Monitored during session, Repositioned    Home Living                          Prior Function            PT Goals (current goals can now be found in the care plan section) Progress towards PT goals: Progressing toward goals    Frequency    Min 2X/week      PT Plan      Co-evaluation              AM-PAC PT 6 Clicks Mobility   Outcome Measure  Help needed  turning from your back to your side while in a flat bed without using bedrails?: None Help needed moving from lying on your back to sitting on the side of a flat bed without using bedrails?: A Little Help needed moving to and from a bed to a chair (including a wheelchair)?: A Little Help needed standing up from a chair using your arms (e.g., wheelchair or bedside chair)?: A Little Help needed to walk in hospital room?: A Little Help needed climbing 3-5 steps with a railing? : A Little 6 Click Score: 19    End of Session   Activity Tolerance: Patient tolerated treatment well;Patient limited by fatigue Patient left: in chair;with call bell/phone within reach;with chair alarm set;with family/visitor present Nurse Communication: Mobility status PT Visit Diagnosis: Muscle weakness (generalized) (M62.81);Difficulty in walking, not elsewhere classified (R26.2)     Time: 9140-9087 PT Time Calculation (min) (ACUTE ONLY): 13 min  Charges:    $Gait Training: 8-22 mins PT General Charges $$ ACUTE PT VISIT: 1 Visit                   Lauraine Gills, PTA 07/18/2024, 9:38 AM

## 2024-07-18 NOTE — Plan of Care (Signed)
" °  Problem: Education: Goal: Knowledge of General Education information will improve Description: Including pain rating scale, medication(s)/side effects and non-pharmacologic comfort measures Outcome: Progressing   Problem: Health Behavior/Discharge Planning: Goal: Ability to manage health-related needs will improve Outcome: Progressing   Problem: Clinical Measurements: Goal: Ability to maintain clinical measurements within normal limits will improve Outcome: Progressing Goal: Will remain free from infection Outcome: Progressing Goal: Diagnostic test results will improve Outcome: Progressing Goal: Cardiovascular complication will be avoided Outcome: Progressing   Problem: Activity: Goal: Risk for activity intolerance will decrease Outcome: Progressing   Problem: Nutrition: Goal: Adequate nutrition will be maintained Outcome: Progressing   Problem: Coping: Goal: Level of anxiety will decrease Outcome: Progressing   Problem: Elimination: Goal: Will not experience complications related to bowel motility Outcome: Progressing Goal: Will not experience complications related to urinary retention Outcome: Progressing   Problem: Pain Managment: Goal: General experience of comfort will improve and/or be controlled Outcome: Progressing   Problem: Safety: Goal: Ability to remain free from injury will improve Outcome: Progressing   Problem: Skin Integrity: Goal: Risk for impaired skin integrity will decrease Outcome: Progressing   Problem: Education: Goal: Knowledge of the prescribed therapeutic regimen will improve Outcome: Progressing   Problem: Bowel/Gastric: Goal: Gastrointestinal status for postoperative course will improve Outcome: Progressing   Problem: Cardiac: Goal: Ability to maintain an adequate cardiac output Outcome: Progressing Goal: Will show no evidence of cardiac arrhythmias Outcome: Progressing   Problem: Nutritional: Goal: Will attain and maintain  optimal nutritional status Outcome: Progressing   Problem: Neurological: Goal: Will regain or maintain usual level of consciousness Outcome: Progressing   Problem: Clinical Measurements: Goal: Ability to maintain clinical measurements within normal limits Outcome: Progressing Goal: Postoperative complications will be avoided or minimized Outcome: Progressing   Problem: Respiratory: Goal: Will regain and/or maintain adequate ventilation Outcome: Progressing Goal: Respiratory status will improve Outcome: Progressing   Problem: Skin Integrity: Goal: Demonstrates signs of wound healing without infection Outcome: Progressing   Problem: Urinary Elimination: Goal: Will remain free from infection Outcome: Progressing Goal: Ability to achieve and maintain adequate urine output Outcome: Progressing   "

## 2024-07-19 NOTE — TOC Progression Note (Signed)
 Transition of Care [facility]) - Progression Note    Patient Details  Name: Cameron [name] MRN: [id number] Date of Birth: 12-26-1960  Transition [facility]) CM/SW Contact  Corean ONEIDA Haddock, RN Phone Number: 07/19/2024, 10:50 AM  Clinical Narrative:     [facility] pending for [facility]  Expected Discharge Plan: Skilled Nursing Facility Barriers to Discharge: Continued Medical Work up               Expected Discharge Plan and Services       Living arrangements for the past 2 months: Single Family Home                                       Social Drivers of Health (SDOH) Interventions SDOH Screenings   Food Insecurity: Food Insecurity Present (07/11/2024)  Housing: Low Risk (07/11/2024)  Transportation Needs: No Transportation Needs (07/11/2024)  Utilities: At Risk (07/11/2024)  Financial Resource Strain: Low Risk  (03/24/2024)   Received from [facility]  Social Connections: Socially Isolated (07/11/2024)  Tobacco Use: High Risk (07/13/2024)    Readmission Risk Interventions     No data to display

## 2024-07-19 NOTE — Progress Notes (Signed)
 [facility]  Daily Progress Note   Subjective  -   No major events overnight.  He has noticed more swelling the past 2 to 3 days as his activity level has increased.  He is walking the halls vigorously.  His pain is well-controlled with oral medications at this point  Objective Vitals:   07/19/24 0434 07/19/24 0451 07/19/24 0824 07/19/24 1010  BP: (!) 87/60 95/65 107/70 (!) 109/57  Pulse: 72   70  Resp: 18   16  Temp: 97.7 F (36.5 C)   98 F (36.7 C)  TempSrc:    Oral  SpO2: 96%   98%  Weight:      Height:        Intake/Output Summary (Last 24 hours) at 07/19/2024 1501 Last data filed at 07/19/2024 1347 Gross per 24 hour  Intake 720 ml  Output 500 ml  Net 220 ml    PULM  CTAB CV  RRR VASC  1-2+ left lower extremity edema.  Incisions are both clean, dry, and intact.  Foot is warm with excellent capillary refill.  Laboratory CBC    Component Value Date/Time   WBC 8.3 07/17/2024 1036   HGB 12.8 (L) 07/17/2024 1036   HGB 10.6 (L) 03/26/2014 1232   HCT 39.6 07/17/2024 1036   HCT 32.1 (L) 03/26/2014 1232   PLT 132 (L) 07/17/2024 1036   PLT 137 (L) 03/26/2014 1232    BMET    Component Value Date/Time   NA 137 07/17/2024 1036   NA 139 03/28/2014 0823   K 4.5 07/17/2024 1036   K 3.4 (L) 03/28/2014 0823   CL 105 07/17/2024 1036   CL 111 (H) 03/28/2014 0823   CO2 23 07/17/2024 1036   CO2 22 03/28/2014 0823   GLUCOSE 101 (H) 07/17/2024 1036   GLUCOSE 106 (H) 03/28/2014 0823   BUN 27 (H) 07/17/2024 1036   BUN 8 03/28/2014 0823   CREATININE 1.37 (H) 07/17/2024 1036   CREATININE 0.97 03/28/2014 0823   CALCIUM  10.3 07/17/2024 1036   CALCIUM  7.8 (L) 03/28/2014 0823   GFRNONAA 58 (L) 07/17/2024 1036   GFRNONAA >60 03/28/2014 0823   GFRAA >60 02/21/2020 0630   GFRAA >60 03/28/2014 9176    Assessment/Planning: POD #6 s/p left femoral to distal bypass, left common femoral, superficial femoral, and separate distinct profunda femoris  endarterectomy as well as tibial endarterectomies  Doing quite well. Swelling is worse which is not surprising giving increased activity and more dependency. Feet are warm and well-perfused and he has feeling back in his left foot I would try to elevate the leg is much as possible when he is not ambulating Ambulation and activity are great. He lives at home alone, so going to rehab for some time is likely helpful particularly with his nephrostomy tube in place Searching for placement at this point   Selinda Gu  07/19/2024, 3:01 PM

## 2024-07-19 NOTE — Progress Notes (Signed)
 Mobility Specialist Progress Note:    07/19/24 0824  Therapy Vitals  BP 107/70  Patient Position (if appropriate) Sitting  Mobility  Activity Ambulated with assistance  Level of Assistance Standby assist, set-up cues, supervision of patient - no hands on  Assistive Device Front wheel walker  Distance Ambulated (ft) 100 ft  Range of Motion/Exercises Active;All extremities  Activity Response Tolerated well  Mobility visit 1 Mobility  Mobility Specialist Start Time (ACUTE ONLY) 0820  Mobility Specialist Stop Time (ACUTE ONLY) [id number]  Mobility Specialist Time Calculation (min) (ACUTE ONLY) 13 min   Pt received in bed, agreeable to mobility. BP taken prior to mobility d/t lower BP this morning. Required SBA to stand and ambulate with RW. Tolerated well, c/o increased pain in left calf today. Returned to room, left pt sitting EOB. Notified RN of pain, all needs met.  Sherrilee Ditty Mobility Specialist Please contact via Special Educational Needs Teacher or  Rehab office at [phone]

## 2024-07-20 ENCOUNTER — Other Ambulatory Visit: Payer: Self-pay

## 2024-07-20 ENCOUNTER — Ambulatory Visit: Attending: Orthopedic Surgery

## 2024-07-20 MED ORDER — EZETIMIBE 10 MG PO TABS
10.0000 mg | ORAL_TABLET | Freq: Every day | ORAL | 6 refills | Status: DC
  Filled 2024-07-20: qty 30, 30d supply, fill #0

## 2024-07-20 MED ORDER — OXYCODONE HCL 5 MG PO TABS: 5.0000 mg | ORAL_TABLET | Freq: Four times a day (QID) | ORAL | 0 refills | Status: AC | PRN

## 2024-07-20 NOTE — Discharge Summary (Signed)
 " [facility] VASCULAR & VEIN SPECIALISTS    Discharge Summary    Patient ID:  Cameron [name] MRN: [id number] DOB/AGE: [birth year] 63 y.o.  Admit date: 07/11/2024 Discharge date: 07/20/2024 Date of Surgery: 07/13/2024 Surgeon: Surgeon(s): Marea Selinda RAMAN, MD  Admission Diagnosis: Atherosclerosis of artery of extremity with rest pain [facility]) [I70.229]  Discharge Diagnoses:  Atherosclerosis of artery of extremity with rest pain [facility]) [I70.229]  Secondary Diagnoses: Past Medical History:  Diagnosis Date   Arthritis    CHF (congestive heart failure) (HCC)    Coronary artery disease    a. 12/17 PCI with DES to pLAD with resdiual disease (RX therapy)   Depression    Global hypokinesis of left ventricle    Gout    Grade II diastolic dysfunction    History of kidney stones    History of percutaneous coronary intervention 2017   Hyperlipidemia    Hypertension    Left atrial dilation    Neuromuscular disorder (HCC)    leg numb/ can walk by self   PAD (peripheral artery disease)    2 stents in left leg   Right atrial dilation    S/P drug eluting coronary stent placement 2017   STEMI (ST elevation myocardial infarction) (HCC) 2017   Substance abuse (HCC)    alcohol and marijuana    Procedures: ENDARTERECTOMY, FEMORAL APPLICATION OF CELL SAVER THROMBOENDARTERECTOMY, TIBIOPERONEAL TRUNK CREATION, BYPASS, ARTERIAL, FEMORAL TO TIBIAL, USING GRAFT  Discharged Condition: good  HPI:  Cameron [name] is a 64 year old male that presented to [facility] on 07/11/2024 to undergo angiogram for extensive left lower extremity rest pain.  It was found that he had significant disease at that time and he was scheduled for endarterectomy.  To complicate the underlying situation he was found to have gross hematuria prior to his intervention.  He had been established with urology and a CT urogram showed a large left posterior bladder mass.  Because of his upcoming vascular  intervention he would require at least 3 months of anticoagulation.  In order to help preserve his kidney function he had placement of a left nephrostomy tube.  It is anticipated that he will maintain his nephrostomy tube until he can hold anticoagulation to possibly undergo a TURBT.  Since his intervention he has done fairly well.  He notes that the pain and discomfort he was having in his left leg have improved however he has been having some swelling.  The swelling is worsening as he has increased his activity.  However the wounds themselves are clean dry and intact without any significant dehiscence.  Overall he is progressing as expected.  Hospital Course:  Cameron [name] is a 64 y.o. male is S/P Left femoral to distal bypass, left common femoral, superficial femoral, and separate distinct profunda femoris endarterectomy as well as tibial endarterectomies  Extubated: POD # 0 Physical Exam:  Alert notes x3, no acute distress Face: Symmetrical.  Tongue is midline. Neck: Trachea is midline.  No swelling or bruising. Cardiovascular: Regular rate and rhythm Pulmonary: Clear to auscultation bilaterally Abdomen: Soft, nontender, nondistended Right groin access: Clean dry and intact.  No swelling or drainage noted Left groin access: Clean dry and intact.  No swelling or drainage noted Left lower extremity: Thigh soft.  Calf soft.  Extremities warm distally toes.  Hard to palpate pedal pulses however the foot is warm is her good capillary refill.LLE Edema noted  Right lower extremity: Thigh soft.  Calf soft.  Extremities warm distally  toes.  Hard to palpate pedal pulses however the foot is warm is her good capillary refill. Neurological: No deficits noted   Post-op wounds:  clean, dry, intact or healing well  Pt. Ambulating, voiding and taking PO diet without difficulty. Pt pain controlled with PO pain meds.  Labs:  As below  Complications: none  Consults:  Treatment Team:  Georganne Penne SAUNDERS, MD  Significant Diagnostic Studies: CBC Lab Results  Component Value Date   WBC 8.3 07/17/2024   HGB 12.8 (L) 07/17/2024   HCT 39.6 07/17/2024   MCV 94.7 07/17/2024   PLT 132 (L) 07/17/2024    BMET    Component Value Date/Time   NA 137 07/17/2024 1036   NA 139 03/28/2014 0823   K 4.5 07/17/2024 1036   K 3.4 (L) 03/28/2014 0823   CL 105 07/17/2024 1036   CL 111 (H) 03/28/2014 0823   CO2 23 07/17/2024 1036   CO2 22 03/28/2014 0823   GLUCOSE 101 (H) 07/17/2024 1036   GLUCOSE 106 (H) 03/28/2014 0823   BUN 27 (H) 07/17/2024 1036   BUN 8 03/28/2014 0823   CREATININE 1.37 (H) 07/17/2024 1036   CREATININE 0.97 03/28/2014 0823   CALCIUM  10.3 07/17/2024 1036   CALCIUM  7.8 (L) 03/28/2014 0823   GFRNONAA 58 (L) 07/17/2024 1036   GFRNONAA >60 03/28/2014 0823   GFRAA >60 02/21/2020 0630   GFRAA >60 03/28/2014 0823   COAG Lab Results  Component Value Date   INR 1.0 07/11/2024   INR 1.0 03/10/2023   INR 1.1 05/29/2021     Disposition:  Discharge to :Skilled nursing facility  Allergies as of 07/20/2024   No Known Allergies      Medication List     TAKE these medications    acetaminophen  325 MG tablet Commonly known as: TYLENOL  Take 2 tablets (650 mg total) by mouth every 6 (six) hours as needed for moderate pain, mild pain, fever or headache.   allopurinol  300 MG tablet Commonly known as: ZYLOPRIM  Take 300 mg by mouth daily.   aspirin  EC 81 MG tablet Take 1 tablet (81 mg total) by mouth daily. Swallow whole.   atorvastatin  80 MG tablet Commonly known as: LIPITOR  Take 1 tablet (80 mg total) by mouth daily. What changed: how much to take   colchicine  0.6 MG tablet Take 0.6 mg by mouth.   collagenase  250 UNIT/GM ointment Commonly known as: SANTYL  Apply topically.   dapagliflozin  propanediol 10 MG Tabs tablet Commonly known as: FARXIGA  Take 10 mg by mouth daily.   Eliquis  5 MG Tabs tablet Generic drug: apixaban  Take 1 tablet by mouth 2  (two) times daily.   ezetimibe  10 MG tablet Commonly known as: ZETIA  Take 1 tablet (10 mg total) by mouth daily. Start taking on: July 21, 2024   furosemide  40 MG tablet Commonly known as: LASIX  Take 40 mg by mouth 2 (two) times daily.   gabapentin  300 MG capsule Commonly known as: NEURONTIN  Take 300 mg by mouth 3 (three) times daily.   hydrocortisone  25 MG suppository Commonly known as: ANUSOL -HC One suppository twice a day as needed for hemorrhoids.  (Generic okay to substitute)   ibuprofen 200 MG tablet Commonly known as: ADVIL Take 200 mg by mouth every 6 (six) hours as needed for headache or mild pain.   metoprolol  succinate 25 MG 24 hr tablet Commonly known as: TOPROL -XL Take 12.5 mg by mouth.   nicotine  14 mg/24hr patch Commonly known as: NICODERM CQ  - dosed  in mg/24 hours One 14 mg patch chest wall daily as needed for nicotine  craving.  (Can substitute generic)   nitroGLYCERIN  0.4 MG SL tablet Commonly known as: NITROSTAT  Place under the tongue.   oxyCODONE  5 MG immediate release tablet Commonly known as: Oxy IR/ROXICODONE  Take 1 tablet (5 mg total) by mouth every 6 (six) hours as needed. What changed:  when to take this reasons to take this   pantoprazole  40 MG tablet Commonly known as: Protonix  Take 1 tablet (40 mg total) by mouth daily.   Polyethylene Glycol 4500 Powd 1 application  by Does not apply route as needed.   predniSONE  20 MG tablet Commonly known as: DELTASONE  Take 20 mg by mouth 2 (two) times daily.   sertraline 100 MG tablet Commonly known as: ZOLOFT Take 100 mg by mouth daily. What changed: Another medication with the same name was removed. Continue taking this medication, and follow the directions you see here.   spironolactone  25 MG tablet Commonly known as: ALDACTONE  Take 25 mg by mouth 2 (two) times daily.       Verbal and written Discharge instructions given to the patient. Wound care per Discharge AVS  Contact  information for follow-up providers     Custovic, Sabina, DO. Go in 1 week(s).   Specialty: Cardiology Contact information: [address] [place] [address] [phone]         Delores Orvin BRAVO, NP Follow up in 2 week(s).   Specialty: Vascular Surgery Why: in 2-3 weeks with ABI and wound check Contact information: [address] [place] [address] [phone]         Georganne Penne SAUNDERS, MD Follow up.   Specialty: Urology Why: He has appt at 10:45 on 07/22/24 Contact information: [address] [place] [address] [phone]              Contact information for after-discharge care     Destination     [facility]   Service: Skilled Nursing Contact information: [address] [place]  [address] [phone]                     Signed: Orvin BRAVO Delores, NP  07/20/2024, 2:05 PM   "

## 2024-07-20 NOTE — TOC Progression Note (Signed)
 Transition of Care [facility]) - Progression Note    Patient Details  Name: Cameron [name] MRN: [id number] Date of Birth: October 03, 1960  Transition of Care [facility]) CM/SW Contact  Corean ONEIDA Haddock, RN Phone Number: 07/20/2024, 9:12 AM  Clinical Narrative:     Insurance auth pending for [facility]  Expected Discharge Plan: Skilled Nursing Facility Barriers to Discharge: Continued Medical Work up               Expected Discharge Plan and Services       Living arrangements for the past 2 months: Single Family Home                                       Social Drivers of Health (SDOH) Interventions SDOH Screenings   Food Insecurity: Food Insecurity Present (07/11/2024)  Housing: Low Risk (07/11/2024)  Transportation Needs: No Transportation Needs (07/11/2024)  Utilities: At Risk (07/11/2024)  Financial Resource Strain: Low Risk  (03/24/2024)   Received from [facility]  Social Connections: Socially Isolated (07/11/2024)  Tobacco Use: High Risk (07/13/2024)    Readmission Risk Interventions     No data to display

## 2024-07-20 NOTE — TOC Transition Note (Signed)
 Transition of Care [facility]) - Discharge Note   Patient Details  Name: Cameron [name] MRN: [id number] Date of Birth: 05-08-1961  Transition of Care [facility]) CM/SW Contact:  Corean ONEIDA Haddock, RN Phone Number: 07/20/2024, 2:52 PM   Clinical Narrative:     Shara was approved for [facility]   Patient will DC to: [facility] Anticipated DC date: 07/20/2024  Family notified: Patient states that he will update family Transport by: [facility] Ref #[id number]  Per MD patient ready for DC to . RN, patient, , and facility notified of DC. Discharge Summary sent to facility. RN given number for report. DC packet on chart.   TOC signing off.    Final next level of care: Skilled Nursing Facility Barriers to Discharge: Continued Medical Work up   Patient Goals and CMS Choice            Discharge Placement                       Discharge Plan and Services Additional resources added to the After Visit Summary for                                       Social Drivers of Health (SDOH) Interventions SDOH Screenings   Food Insecurity: Food Insecurity Present (07/11/2024)  Housing: Low Risk (07/11/2024)  Transportation Needs: No Transportation Needs (07/11/2024)  Utilities: At Risk (07/11/2024)  Financial Resource Strain: Low Risk  (03/24/2024)   Received from [facility]  Social Connections: Socially Isolated (07/11/2024)  Tobacco Use: High Risk (07/13/2024)     Readmission Risk Interventions     No data to display

## 2024-07-20 NOTE — Progress Notes (Signed)
 Physical Therapy Treatment Patient Details Name: Cameron [name] MRN: [id number] DOB: 01/25/1961 Today's Date: 07/20/2024   History of Present Illness 64 y.o. male  with a past medical history of chronic systolic CHF with apical akinesis with an EF of 20%, coronary artery disease with STEMI in 2017 with PCI and stent to LAD, paroxysmal atrial fibrillation, NSVT, apical mural thrombus, hypertension, hyperlipidemia who presented on 07/11/2024 for outpatient peripheral vascular catheterization. Now s/p left femoral to distal bypass, left tibial endarterectomy, common femoral/SFA endarterectomy, and separate and distinct profunda femoris endarterectomy.    PT Comments  Pt seen for PT tx with pt received ambulating in room with RW. Pt is able to ambulate to stairs & abck with RW & mod I, gait pattern as noted below. Pt negotiated 5 steps with B rails & supervision with compensatory pattern to simulate home setup.  Pt making steady progress with mobility, just anxious re: nephrostomy tube.   If plan is discharge home, recommend the following: Assistance with cooking/housework;Assist for transportation;Help with stairs or ramp for entrance   Can travel by private vehicle     Yes  Equipment Recommendations  BSC/3in1    Recommendations for Other Services       Precautions / Restrictions Precautions Precautions: Fall Restrictions Weight Bearing Restrictions Per Provider Order: No     Mobility  Bed Mobility               General bed mobility comments: not tested, pt received standing in room, left sitting EOB    Transfers                   General transfer comment: not tested, pt received standing in room, left sitting EOB    Ambulation/Gait Ambulation/Gait assistance: Modified independent (Device/Increase time) Gait Distance (Feet):  (300 ft\) Assistive device: Rolling walker (2 wheels) Gait Pattern/deviations: Decreased step length - right, Decreased step length -  left, Decreased stride length, Decreased dorsiflexion - left Gait velocity: decreased     General Gait Details: Pt with L foot externally rotated, dragging foot, able to correct with cuing & clears foot but still somewhat externally rotated, decreased LLE dorisflexion.   Stairs Stairs: Yes Stairs assistance: Supervision Stair Management: Two rails, Step to pattern Number of Stairs: 5 (6) General stair comments: educated on compensatory pattern   Wheelchair Mobility     Tilt Bed    Modified Rankin (Stroke Patients Only)       Balance     Sitting balance-Leahy Scale: Normal     Standing balance support: Bilateral upper extremity supported, Reliant on assistive device for balance Standing balance-Leahy Scale: Good                              Communication Communication Communication: No apparent difficulties  Cognition Arousal: Alert Behavior During Therapy: Anxious, WFL for tasks assessed/performed   PT - Cognitive impairments: No apparent impairments                       PT - Cognition Comments: anxious about d/c home alone 2/2 nephrostomy tube Following commands: Intact      Cueing Cueing Techniques: Verbal cues  Exercises      General Comments General comments (skin integrity, edema, etc.): Educated pt on use of walker bag to transport items PRN.      Pertinent Vitals/Pain Pain Assessment Pain Score: 8  Pain Location: LLE Pain Descriptors /  Indicators: Discomfort Pain Intervention(s): Monitored during session    Home Living                          Prior Function            PT Goals (current goals can now be found in the care plan section) Acute Rehab PT Goals Patient Stated Goal: go to rehab PT Goal Formulation: With patient Time For Goal Achievement: 07/27/24 Potential to Achieve Goals: Fair Progress towards PT goals: Progressing toward goals    Frequency    Min 2X/week      PT Plan       Co-evaluation              AM-PAC PT 6 Clicks Mobility   Outcome Measure  Help needed turning from your back to your side while in a flat bed without using bedrails?: None Help needed moving from lying on your back to sitting on the side of a flat bed without using bedrails?: None Help needed moving to and from a bed to a chair (including a wheelchair)?: None Help needed standing up from a chair using your arms (e.g., wheelchair or bedside chair)?: None Help needed to walk in hospital room?: None Help needed climbing 3-5 steps with a railing? : A Little 6 Click Score: 23    End of Session   Activity Tolerance: Patient tolerated treatment well Patient left: in bed;with call bell/phone within reach Nurse Communication: Mobility status PT Visit Diagnosis: Muscle weakness (generalized) (M62.81);Other abnormalities of gait and mobility (R26.89);Pain Pain - Right/Left: Left Pain - part of body: Leg     Time: 1340-1355 PT Time Calculation (min) (ACUTE ONLY): 15 min  Charges:    $Therapeutic Activity: 8-22 mins PT General Charges $$ ACUTE PT VISIT: 1 Visit                     Richerd Pinal, PT, DPT 07/20/2024, 2:01 PM    Richerd CHRISTELLA Pinal 07/20/2024, 2:00 PM

## 2024-07-20 NOTE — Progress Notes (Signed)
 Mobility Specialist Progress Note:    07/20/24 0925  Mobility  Activity Ambulated with assistance  Level of Assistance Standby assist, set-up cues, supervision of patient - no hands on  Assistive Device Front wheel walker  Distance Ambulated (ft) 160 ft  Range of Motion/Exercises Active;All extremities  Activity Response Tolerated well  Mobility visit 1 Mobility  Mobility Specialist Start Time (ACUTE ONLY) 0900  Mobility Specialist Stop Time (ACUTE ONLY) 0925  Mobility Specialist Time Calculation (min) (ACUTE ONLY) 25 min   Pt received in bed, agreeable to mobility. Required SBA to stand and ambulate with RW. Tolerated well, c/o pain in left calf rated 6/10. Returned to room, left pt supine with LLE elevated. All needs met.  Sherrilee Ditty Mobility Specialist Please contact via Special Educational Needs Teacher or  Rehab office at [phone]

## 2024-07-20 NOTE — Plan of Care (Signed)
" °  Problem: Education: Goal: Knowledge of General Education information will improve Description: Including pain rating scale, medication(s)/side effects and non-pharmacologic comfort measures Outcome: Progressing   Problem: Health Behavior/Discharge Planning: Goal: Ability to manage health-related needs will improve Outcome: Progressing   Problem: Clinical Measurements: Goal: Ability to maintain clinical measurements within normal limits will improve Outcome: Progressing Goal: Will remain free from infection Outcome: Progressing Goal: Diagnostic test results will improve Outcome: Progressing Goal: Cardiovascular complication will be avoided Outcome: Progressing   Problem: Activity: Goal: Risk for activity intolerance will decrease Outcome: Progressing   Problem: Nutrition: Goal: Adequate nutrition will be maintained Outcome: Progressing   Problem: Coping: Goal: Level of anxiety will decrease Outcome: Progressing   Problem: Elimination: Goal: Will not experience complications related to bowel motility Outcome: Progressing Goal: Will not experience complications related to urinary retention Outcome: Progressing   Problem: Pain Managment: Goal: General experience of comfort will improve and/or be controlled Outcome: Progressing   Problem: Safety: Goal: Ability to remain free from injury will improve Outcome: Progressing   Problem: Skin Integrity: Goal: Risk for impaired skin integrity will decrease Outcome: Progressing   Problem: Education: Goal: Knowledge of the prescribed therapeutic regimen will improve Outcome: Progressing   Problem: Bowel/Gastric: Goal: Gastrointestinal status for postoperative course will improve Outcome: Progressing   Problem: Cardiac: Goal: Ability to maintain an adequate cardiac output Outcome: Progressing Goal: Will show no evidence of cardiac arrhythmias Outcome: Progressing   Problem: Nutritional: Goal: Will attain and maintain  optimal nutritional status Outcome: Progressing   Problem: Neurological: Goal: Will regain or maintain usual level of consciousness Outcome: Progressing   Problem: Clinical Measurements: Goal: Ability to maintain clinical measurements within normal limits Outcome: Progressing Goal: Postoperative complications will be avoided or minimized Outcome: Progressing   Problem: Respiratory: Goal: Will regain and/or maintain adequate ventilation Outcome: Progressing Goal: Respiratory status will improve Outcome: Progressing   Problem: Skin Integrity: Goal: Demonstrates signs of wound healing without infection Outcome: Progressing   Problem: Urinary Elimination: Goal: Will remain free from infection Outcome: Progressing Goal: Ability to achieve and maintain adequate urine output Outcome: Progressing   "

## 2024-07-20 NOTE — Progress Notes (Signed)
 Occupational Therapy Treatment Patient Details Name: Cameron [name] MRN: [id number] DOB: [birth year] Today's Date: 07/20/2024   History of present illness 64 y.o. male  with a past medical history of chronic systolic CHF with apical akinesis with an EF of 20%, coronary artery disease with STEMI in 2017 with PCI and stent to LAD, paroxysmal atrial fibrillation, NSVT, apical mural thrombus, hypertension, hyperlipidemia who presented on 07/11/2024 for outpatient peripheral vascular catheterization. Now s/p left femoral to distal bypass, left tibial endarterectomy, common femoral/SFA endarterectomy, and separate and distinct profunda femoris endarterectomy.   OT comments  Patient seen for OT treatment on this date. Upon arrival to room patient resting in bed, patient to be discharged today to SNF, OT present to assess and assist with ADL in prep for discharge, agreeable to treatment. Patient performed bed mobility with mod I, performed dressing tasks with set up A; instructed on compensatory techniques for LB dressing due to LLE pain with good return demo; min A needed to don L shoe due to swelling in LLE, patient tolerated well.  Patient ended treatment sitting EOB with NT present to assess vitals.  Patient making good progress toward goals, will continue to follow POC. Discharge recommendation remains appropriate.        If plan is discharge home, recommend the following:  A little help with walking and/or transfers;A little help with bathing/dressing/bathroom;Assistance with cooking/housework;Help with stairs or ramp for entrance;Assist for transportation   Equipment Recommendations       Recommendations for Other Services      Precautions / Restrictions Precautions Precautions: Fall Recall of Precautions/Restrictions: Intact Restrictions Weight Bearing Restrictions Per Provider Order: No       Mobility Bed Mobility Overal bed mobility: Modified Independent             General  bed mobility comments: transitioned from supine to EOB without A    Transfers Overall transfer level: Needs assistance Equipment used: Rolling walker (2 wheels) Transfers: Sit to/from Stand Sit to Stand: Supervision                 Balance Overall balance assessment: Modified Independent   Sitting balance-Leahy Scale: Normal     Standing balance support: Bilateral upper extremity supported, Reliant on assistive device for balance Standing balance-Leahy Scale: Good                             ADL either performed or assessed with clinical judgement   ADL Overall ADL's : Needs assistance/impaired                                       General ADL Comments: able to perform all parts of dressing, increased difficulty with LB dressing (specifically donning L shoe)    Extremity/Trunk Assessment Upper Extremity Assessment Upper Extremity Assessment: Overall WFL for tasks assessed            Vision       Perception     Praxis     Communication Communication Communication: No apparent difficulties   Cognition Arousal: Alert Behavior During Therapy: WFL for tasks assessed/performed Cognition: No apparent impairments                               Following commands: Intact  Cueing   Cueing Techniques: Verbal cues  Exercises      Shoulder Instructions       General Comments Educated pt on use of walker bag to transport items PRN.    Pertinent Vitals/ Pain       Pain Assessment Pain Assessment: 0-10 Pain Score: 5  Pain Location: LLE Pain Descriptors / Indicators: Discomfort Pain Intervention(s): Monitored during session  Home Living                                          Prior Functioning/Environment              Frequency  Min 2X/week        Progress Toward Goals  OT Goals(current goals can now be found in the care plan section)  Progress towards OT goals:  Progressing toward goals  Acute Rehab OT Goals Patient Stated Goal: to get stronger OT Goal Formulation: With patient Time For Goal Achievement: 07/29/24 Potential to Achieve Goals: Fair ADL Goals Pt Will Perform Grooming: with modified independence Pt Will Perform Lower Body Dressing: with modified independence;sit to/from stand Pt Will Transfer to Toilet: with modified independence;ambulating Pt Will Perform Toileting - Clothing Manipulation and hygiene: with modified independence;sit to/from stand  Plan      Co-evaluation                 AM-PAC OT 6 Clicks Daily Activity     Outcome Measure   Help from another person eating meals?: None Help from another person taking care of personal grooming?: None Help from another person toileting, which includes using toliet, bedpan, or urinal?: A Little Help from another person bathing (including washing, rinsing, drying)?: A Little Help from another person to put on and taking off regular upper body clothing?: A Little Help from another person to put on and taking off regular lower body clothing?: A Little 6 Click Score: 20    End of Session Equipment Utilized During Treatment: Rolling walker (2 wheels)  OT Visit Diagnosis: Unsteadiness on feet (R26.81);Repeated falls (R29.6);Muscle weakness (generalized) (M62.81)   Activity Tolerance Patient tolerated treatment well   Patient Left in bed;with nursing/sitter in room   Nurse Communication Mobility status        Time: 8563-8548 OT Time Calculation (min): 15 min  Charges: OT General Charges $OT Visit: 1 Visit OT Treatments $Self Care/Home Management : 8-22 mins  Rogers Clause, OT/L MSOT, 07/20/2024

## 2024-07-20 NOTE — Plan of Care (Signed)
" °  Problem: Education: Goal: Knowledge of General Education information will improve Description: Including pain rating scale, medication(s)/side effects and non-pharmacologic comfort measures Outcome: Adequate for Discharge   Problem: Health Behavior/Discharge Planning: Goal: Ability to manage health-related needs will improve Outcome: Adequate for Discharge   Problem: Clinical Measurements: Goal: Ability to maintain clinical measurements within normal limits will improve Outcome: Adequate for Discharge Goal: Will remain free from infection Outcome: Adequate for Discharge Goal: Diagnostic test results will improve Outcome: Adequate for Discharge Goal: Cardiovascular complication will be avoided Outcome: Adequate for Discharge   Problem: Activity: Goal: Risk for activity intolerance will decrease Outcome: Adequate for Discharge   Problem: Nutrition: Goal: Adequate nutrition will be maintained Outcome: Adequate for Discharge   Problem: Coping: Goal: Level of anxiety will decrease Outcome: Adequate for Discharge   Problem: Elimination: Goal: Will not experience complications related to bowel motility Outcome: Adequate for Discharge Goal: Will not experience complications related to urinary retention Outcome: Adequate for Discharge   Problem: Pain Managment: Goal: General experience of comfort will improve and/or be controlled Outcome: Adequate for Discharge   Problem: Safety: Goal: Ability to remain free from injury will improve Outcome: Adequate for Discharge   Problem: Skin Integrity: Goal: Risk for impaired skin integrity will decrease Outcome: Adequate for Discharge   Problem: Education: Goal: Knowledge of the prescribed therapeutic regimen will improve Outcome: Adequate for Discharge   Problem: Bowel/Gastric: Goal: Gastrointestinal status for postoperative course will improve Outcome: Adequate for Discharge   Problem: Cardiac: Goal: Ability to maintain an  adequate cardiac output Outcome: Adequate for Discharge Goal: Will show no evidence of cardiac arrhythmias Outcome: Adequate for Discharge   Problem: Nutritional: Goal: Will attain and maintain optimal nutritional status Outcome: Adequate for Discharge   Problem: Neurological: Goal: Will regain or maintain usual level of consciousness Outcome: Adequate for Discharge   Problem: Clinical Measurements: Goal: Ability to maintain clinical measurements within normal limits Outcome: Adequate for Discharge Goal: Postoperative complications will be avoided or minimized Outcome: Adequate for Discharge   Problem: Respiratory: Goal: Will regain and/or maintain adequate ventilation Outcome: Adequate for Discharge Goal: Respiratory status will improve Outcome: Adequate for Discharge   Problem: Skin Integrity: Goal: Demonstrates signs of wound healing without infection Outcome: Adequate for Discharge   Problem: Urinary Elimination: Goal: Will remain free from infection Outcome: Adequate for Discharge Goal: Ability to achieve and maintain adequate urine output Outcome: Adequate for Discharge   "

## 2024-07-22 ENCOUNTER — Other Ambulatory Visit: Payer: Self-pay

## 2024-07-22 ENCOUNTER — Ambulatory Visit: Admitting: Urology

## 2024-07-22 ENCOUNTER — Encounter: Payer: Self-pay | Admitting: Urology

## 2024-07-22 VITALS — BP 116/71 | HR 78 | Ht 67.0 in | Wt 173.6 lb

## 2024-07-22 DIAGNOSIS — N1330 Unspecified hydronephrosis: Secondary | ICD-10-CM

## 2024-07-22 DIAGNOSIS — N3289 Other specified disorders of bladder: Secondary | ICD-10-CM | POA: Diagnosis not present

## 2024-07-22 NOTE — Assessment & Plan Note (Signed)
 Left hydroureteronephrosis  - secondary to cT3 bladder mass   - s/p IR PCN 07/12/24  - Will remain indefinitely-pending overall bladder cancer diagnosis /plan -Recommend routine exchange within ~2-3 months, ~due ~Feb/Mar 2026 - Will re-refer to IR to ensure he is on schedule

## 2024-07-22 NOTE — Assessment & Plan Note (Signed)
 CTU (07/02/24) - 3.8 cm Left lateral bladder wall mass with left hydronephrosis  Plan cystoscopy/TURBT postponed d/t acute limb ischemia/re-vascularization Now on ~90mo anticoagulation  - We need to proceed with TURBT once he is safe to temporarily hold anticoagulation  - Need clearance from Dr. Marea vascular surgery on when this is safe  -Explained the importance of resection, confirmation histology--high likelihood of locally advanced bladder cancer - RTC to see me first week Mar 2026 - expect we will discuss pre-op / surgical date near that time frame

## 2024-07-22 NOTE — Progress Notes (Signed)
 Orders for Neph Tube Exchange sent to IR.

## 2024-08-04 ENCOUNTER — Emergency Department
Admission: EM | Admit: 2024-08-04 | Discharge: 2024-08-05 | Disposition: A | Discharge status: Home / Self Care | Attending: Emergency Medicine | Admitting: Emergency Medicine

## 2024-08-04 ENCOUNTER — Other Ambulatory Visit: Payer: Self-pay

## 2024-08-04 DIAGNOSIS — I110 Hypertensive heart disease with heart failure: Secondary | ICD-10-CM | POA: Diagnosis not present

## 2024-08-04 DIAGNOSIS — D72829 Elevated white blood cell count, unspecified: Secondary | ICD-10-CM | POA: Diagnosis not present

## 2024-08-04 DIAGNOSIS — I2510 Atherosclerotic heart disease of native coronary artery without angina pectoris: Secondary | ICD-10-CM | POA: Insufficient documentation

## 2024-08-04 DIAGNOSIS — R944 Abnormal results of kidney function studies: Secondary | ICD-10-CM | POA: Insufficient documentation

## 2024-08-04 DIAGNOSIS — L03116 Cellulitis of left lower limb: Secondary | ICD-10-CM | POA: Insufficient documentation

## 2024-08-04 DIAGNOSIS — L7634 Postprocedural seroma of skin and subcutaneous tissue following other procedure: Secondary | ICD-10-CM | POA: Diagnosis present

## 2024-08-04 DIAGNOSIS — I509 Heart failure, unspecified: Secondary | ICD-10-CM | POA: Insufficient documentation

## 2024-08-04 LAB — BASIC METABOLIC PANEL WITH GFR
Anion gap: 14 (ref 5–15)
BUN: 49 mg/dL — ABNORMAL HIGH (ref 8–23)
CO2: 25 mmol/L (ref 22–32)
Calcium: 8.9 mg/dL (ref 8.9–10.3)
Chloride: 99 mmol/L (ref 98–111)
Creatinine, Ser: 1.91 mg/dL — ABNORMAL HIGH (ref 0.61–1.24)
GFR, Estimated: 39 mL/min — ABNORMAL LOW
Glucose, Bld: 119 mg/dL — ABNORMAL HIGH (ref 70–99)
Potassium: 3.6 mmol/L (ref 3.5–5.1)
Sodium: 138 mmol/L (ref 135–145)

## 2024-08-04 LAB — CBC
HCT: 41.5 % (ref 39.0–52.0)
Hemoglobin: 13.3 g/dL (ref 13.0–17.0)
MCH: 30 pg (ref 26.0–34.0)
MCHC: 32 g/dL (ref 30.0–36.0)
MCV: 93.5 fL (ref 80.0–100.0)
Platelets: 189 10*3/uL (ref 150–400)
RBC: 4.44 MIL/uL (ref 4.22–5.81)
RDW: 16.6 % — ABNORMAL HIGH (ref 11.5–15.5)
WBC: 26.4 10*3/uL — ABNORMAL HIGH (ref 4.0–10.5)
nRBC: 0 % (ref 0.0–0.2)

## 2024-08-04 NOTE — ED Triage Notes (Addendum)
 Pt comes with c/o abnormal labs. Pt states they told him he had some labs that were abnormal and might have infection in his blood. Pt not sure what labs. Pt was just admitted to hospital and dc to rehab on 14th. Pt just got home yesterday from rehab. They did labs at rehab.

## 2024-08-04 NOTE — Progress Notes (Signed)
 During TCC call with med reconcillation call was disconnected.  CM attempted 2 additional calls to patient as indicated.  Requested a return call and left CM tele # for same.   Devere FELIX Shona, BSN, RN, CCM [employer] [facility] Office Phone    [phone]

## 2024-08-05 ENCOUNTER — Emergency Department

## 2024-08-05 MED ORDER — SULFAMETHOXAZOLE-TRIMETHOPRIM 800-160 MG PO TABS
1.0000 | ORAL_TABLET | Freq: Once | ORAL | Status: AC
  Administered 2024-08-05: 1 via ORAL
  Filled 2024-08-05: qty 1

## 2024-08-05 MED ORDER — SULFAMETHOXAZOLE-TRIMETHOPRIM 800-160 MG PO TABS: 1.0000 | ORAL_TABLET | Freq: Two times a day (BID) | ORAL | 0 refills | Status: AC

## 2024-08-05 MED ORDER — CEPHALEXIN 500 MG PO CAPS: 500.0000 mg | ORAL_CAPSULE | Freq: Three times a day (TID) | ORAL | 0 refills | Status: AC

## 2024-08-05 MED ORDER — CEPHALEXIN 500 MG PO CAPS
500.0000 mg | ORAL_CAPSULE | Freq: Once | ORAL | Status: AC
  Administered 2024-08-05: 500 mg via ORAL
  Filled 2024-08-05: qty 1

## 2024-08-05 NOTE — ED Provider Notes (Signed)
 "  [facility] Provider Note    Event Date/Time   First MD Initiated Contact with Patient 08/04/24 2304     (approximate)   History   Chief Complaint: abnormal labs   HPI  Cameron [name] is a 64 y.o. male with a history of CHF peripheral artery disease with recent left femoral artery bypass performed by Dr. Marea 3 weeks ago who comes ED for evaluation due to being told that outpatient labs were abnormal that were performed when he was discharged from rehab facility yesterday.  He denies any pain fever chills night sweats.  Eating and drinking normally, no vomiting or diarrhea.  No dizziness or palpitations.  States that he feels just fine.  He does note that since the surgery, his left lower leg incision has had copious clear drainage which is saturated bandages.  He has had to frequently change the dressing.  Denies any thick purulent discharge.        Past Medical History:  Diagnosis Date   Arthritis    CHF (congestive heart failure) (HCC)    Coronary artery disease    a. 12/17 PCI with DES to pLAD with resdiual disease (RX therapy)   Depression    Global hypokinesis of left ventricle    Gout    Grade II diastolic dysfunction    History of kidney stones    History of percutaneous coronary intervention 2017   Hyperlipidemia    Hypertension    Left atrial dilation    Neuromuscular disorder (HCC)    leg numb/ can walk by self   PAD (peripheral artery disease)    2 stents in left leg   Right atrial dilation    S/P drug eluting coronary stent placement 2017   STEMI (ST elevation myocardial infarction) (HCC) 2017   Substance abuse (HCC)    alcohol and marijuana    Current Outpatient Rx   Order #: [id number] Class: Normal   Order #: [id number] Class: Normal   Order #: [id number] Class: OTC   Order #: [id number] Class: Historical Med   Order #: [id number] Class: Normal   Order #: [id number] Class: No Print   Order #: [id number] Class: Historical Med    Order #: [id number] Class: Historical Med   Order #: [id number] Class: Historical Med   Order #: [id number] Class: Historical Med   Order #: [id number] Class: Historical Med   Order #: [id number] Class: Historical Med   Order #: [id number] Class: Historical Med   Order #: [id number] Class: Historical Med   Order #: [id number] Class: Print   Order #: [id number] Class: Historical Med   Order #: [id number] Class: Historical Med   Order #: [id number] Class: Historical Med    Past Surgical History:  Procedure Laterality Date   APPLICATION OF CELL SAVER N/A 07/13/2024   Procedure: APPLICATION OF CELL SAVER;  Surgeon: Marea Selinda RAMAN, MD;  Location: [facility] ORS;  Service: Vascular;  Laterality: N/A;   CARDIAC CATHETERIZATION N/A 06/17/2016   Procedure: Left Heart Cath and Coronary Angiography;  Surgeon: Dorn JINNY Lesches, MD;  Location: [facility] INVASIVE CV LAB;  Service: Cardiovascular;  Laterality: N/A;   CARDIAC CATHETERIZATION N/A 06/17/2016   Procedure: Coronary Stent Intervention;  Surgeon: Dorn JINNY Lesches, MD;  Location: [facility] INVASIVE CV LAB;  Service: Cardiovascular;  Laterality: N/A;   cardiac stents     CATARACT EXTRACTION W/PHACO Right 05/30/2024   Procedure: PHACOEMULSIFICATION, CATARACT, WITH IOL INSERTION 2.49 00:23.5;  Surgeon: Myrna Adine Anes, MD;  Location: [facility];  Service: Ophthalmology;  Laterality: Right;   CATARACT EXTRACTION W/PHACO Left  06/13/2024   Procedure: PHACOEMULSIFICATION, CATARACT, WITH IOL INSERTION 2.52 00:27.8;  Surgeon: Myrna Adine Anes, MD;  Location: [facility];  Service: Ophthalmology;  Laterality: Left;   CERVICAL SPINE SURGERY     ENDARTERECTOMY FEMORAL Left 07/13/2024   Procedure: ENDARTERECTOMY, FEMORAL;  Surgeon: Marea Selinda RAMAN, MD;  Location: [facility] OR[facility];  Service: Vascular;  Laterality: Left;   ENDARTERECTOMY TIBIOPERONEAL  07/13/2024   Procedure: THROMBOENDARTERECTOMY, TIBIOPERONEAL TRUNK;  Surgeon: Marea Selinda RAMAN, MD;  Location: [facility] ORS;  Service: Vascular;;    FEMORAL-TIBIAL BYPASS GRAFT Left 07/13/2024   Procedure: CREATION, BYPASS, ARTERIAL, FEMORAL TO TIBIAL, USING GRAFT;  Surgeon: Marea Selinda RAMAN, MD;  Location: [facility] OR[facility];  Service: Vascular;  Laterality: Left;   IR NEPHROSTOMY PLACEMENT LEFT  07/12/2024   LOWER EXTREMITY ANGIOGRAPHY Left 06/25/2020   Procedure: LOWER EXTREMITY ANGIOGRAPHY;  Surgeon: Marea Selinda RAMAN, MD;  Location: [facility] INVASIVE CV LAB;  Service: Cardiovascular;  Laterality: Left;   LOWER EXTREMITY ANGIOGRAPHY Left 03/11/2023   Procedure: Lower Extremity Angiography;  Surgeon: Marea Selinda RAMAN, MD;  Location: [facility] INVASIVE CV LAB;  Service: Cardiovascular;  Laterality: Left;   LOWER EXTREMITY INTERVENTION Left 07/11/2024   Procedure: Lower Extremity Angiography;  Surgeon: Marea Selinda RAMAN, MD;  Location: [facility] INVASIVE CV LAB;  Service: Cardiovascular;  Laterality: Left;    Physical Exam   Triage Vital Signs: ED Triage Vitals  Encounter Vitals Group     BP 08/04/24 1749 100/72     Girls Systolic BP Percentile --      Girls Diastolic BP Percentile --      Boys Systolic BP Percentile --      Boys Diastolic BP Percentile --      Pulse Rate 08/04/24 1749 83     Resp 08/04/24 1749 18     Temp 08/04/24 1749 98 F (36.7 C)     Temp Source 08/04/24 1749 Oral     SpO2 08/04/24 1749 98 %     Weight 08/04/24 1750 170 lb (77.1 kg)     Height 08/04/24 1750 5' 7 (1.702 m)     Head Circumference --      Peak Flow --      Pain Score 08/04/24 1753 0     Pain Loc --      Pain Education --      Exclude from Growth Chart --     Most recent vital signs: Vitals:   08/04/24 1749 08/04/24 2258  BP: 100/72 90/75  Pulse: 83 90  Resp: 18 18  Temp: 98 F (36.7 C) 98 F (36.7 C)  SpO2: 98% 98%    General: Awake, no distress.  CV:  Good peripheral perfusion.  Palpable left popliteal and dorsalis pedis pulses.  Left foot is warm, intact capillary refill Resp:  Normal effort.  Abd:  No distention.  Other:  Symmetric calf circumference.  Surgical  incision over the left upper anterior thigh is clean, healing well, noninflamed.  No tenderness or drainage.  Surgical incision over the left lower leg has some mild erythema along the incision margins.  No tenderness or induration, no purulent discharge.  Appears to be healing.  No crepitus or streaking.   ED Results / Procedures / Treatments   Labs (all labs ordered are listed, but only abnormal results are displayed) Labs Reviewed  CBC - Abnormal; Notable for the following components:      Result Value   WBC 26.4 (*)    RDW 16.6 (*)    All  other components within normal limits  BASIC METABOLIC PANEL WITH GFR - Abnormal; Notable for the following components:   Glucose, Bld 119 (*)    BUN 49 (*)    Creatinine, Ser 1.91 (*)    GFR, Estimated 39 (*)    All other components within normal limits  CULTURE, BLOOD (ROUTINE X 2)  CULTURE, BLOOD (ROUTINE X 2)     EKG    RADIOLOGY Ultrasound left lower extremity interpreted by me negative for DVT.  Radiology report reviewed   PROCEDURES:  Procedures   MEDICATIONS ORDERED IN ED: Medications  cephALEXin  (KEFLEX ) capsule 500 mg (500 mg Oral Given 08/05/24 0203)  sulfamethoxazole -trimethoprim  (BACTRIM  DS) 800-160 MG per tablet 1 tablet (1 tablet Oral Given 08/05/24 0203)     IMPRESSION / MDM / ASSESSMENT AND PLAN / ED COURSE  I reviewed the triage vital signs and the nursing notes.  DDx: Cellulitis, seroma, hematoma, AKI, electrolyte derangement  Patient's presentation is most consistent with acute presentation with potential threat to life or bodily function.  Patient presents for evaluation after being told that outpatient labs were abnormal.  Creatinine is mildly elevated from baseline, which I think is due to dehydration.  He is given IV fluids.  He does have a marked leukocytosis of 26,000.  He has normal vital signs, he is calm and nontoxic, he is not septic.  Blood cultures sent.  Suspect a mild cellulitis or infected  seroma, will start Bactrim  and Keflex , stable to follow-up with Dr. Marea in clinic.       FINAL CLINICAL IMPRESSION(S) / ED DIAGNOSES   Final diagnoses:  Left leg cellulitis  Postoperative seroma of subcutaneous tissue after non-dermatologic procedure     Rx / DC Orders   ED Discharge Orders          Ordered    cephALEXin  (KEFLEX ) 500 MG capsule  3 times daily        08/05/24 0305    sulfamethoxazole -trimethoprim  (BACTRIM  DS) 800-160 MG tablet  2 times daily        08/05/24 0305             Note:  This document was prepared using Dragon voice recognition software and may include unintentional dictation errors.   Viviann Pastor, MD 08/05/24 [phone]  "

## 2024-08-09 ENCOUNTER — Other Ambulatory Visit (INDEPENDENT_AMBULATORY_CARE_PROVIDER_SITE_OTHER): Payer: Self-pay | Admitting: Vascular Surgery

## 2024-08-09 DIAGNOSIS — Z9889 Other specified postprocedural states: Secondary | ICD-10-CM

## 2024-08-10 ENCOUNTER — Other Ambulatory Visit: Payer: Self-pay

## 2024-08-10 ENCOUNTER — Emergency Department

## 2024-08-10 ENCOUNTER — Inpatient Hospital Stay: Admission: EM | Admit: 2024-08-10 | Source: Home / Self Care | Attending: Family Medicine | Admitting: Family Medicine

## 2024-08-10 ENCOUNTER — Ambulatory Visit (INDEPENDENT_AMBULATORY_CARE_PROVIDER_SITE_OTHER): Admitting: Nurse Practitioner

## 2024-08-10 ENCOUNTER — Other Ambulatory Visit (INDEPENDENT_AMBULATORY_CARE_PROVIDER_SITE_OTHER)

## 2024-08-10 ENCOUNTER — Encounter (INDEPENDENT_AMBULATORY_CARE_PROVIDER_SITE_OTHER): Payer: Self-pay | Admitting: Nurse Practitioner

## 2024-08-10 VITALS — BP 101/69 | HR 83 | Ht 67.0 in | Wt 169.6 lb

## 2024-08-10 DIAGNOSIS — I2510 Atherosclerotic heart disease of native coronary artery without angina pectoris: Secondary | ICD-10-CM | POA: Diagnosis not present

## 2024-08-10 DIAGNOSIS — A419 Sepsis, unspecified organism: Secondary | ICD-10-CM | POA: Diagnosis not present

## 2024-08-10 DIAGNOSIS — N189 Chronic kidney disease, unspecified: Secondary | ICD-10-CM

## 2024-08-10 DIAGNOSIS — I509 Heart failure, unspecified: Secondary | ICD-10-CM

## 2024-08-10 DIAGNOSIS — E785 Hyperlipidemia, unspecified: Secondary | ICD-10-CM

## 2024-08-10 DIAGNOSIS — I480 Paroxysmal atrial fibrillation: Secondary | ICD-10-CM | POA: Diagnosis not present

## 2024-08-10 DIAGNOSIS — L0390 Cellulitis, unspecified: Secondary | ICD-10-CM

## 2024-08-10 DIAGNOSIS — L7682 Other postprocedural complications of skin and subcutaneous tissue: Secondary | ICD-10-CM

## 2024-08-10 DIAGNOSIS — Z9889 Other specified postprocedural states: Secondary | ICD-10-CM | POA: Diagnosis not present

## 2024-08-10 DIAGNOSIS — I739 Peripheral vascular disease, unspecified: Secondary | ICD-10-CM

## 2024-08-10 DIAGNOSIS — T8149XA Infection following a procedure, other surgical site, initial encounter: Secondary | ICD-10-CM

## 2024-08-10 DIAGNOSIS — L03116 Cellulitis of left lower limb: Secondary | ICD-10-CM

## 2024-08-10 DIAGNOSIS — F32A Depression, unspecified: Secondary | ICD-10-CM

## 2024-08-10 LAB — CULTURE, BLOOD (ROUTINE X 2)
Culture: NO GROWTH
Culture: NO GROWTH
Special Requests: ADEQUATE
Special Requests: ADEQUATE

## 2024-08-10 LAB — CBC WITH DIFFERENTIAL/PLATELET
Abs Immature Granulocytes: 0.13 10*3/uL — ABNORMAL HIGH (ref 0.00–0.07)
Basophils Absolute: 0.1 10*3/uL (ref 0.0–0.1)
Basophils Relative: 1 %
Eosinophils Absolute: 0.4 10*3/uL (ref 0.0–0.5)
Eosinophils Relative: 3 %
HCT: 37.5 % — ABNORMAL LOW (ref 39.0–52.0)
Hemoglobin: 12.1 g/dL — ABNORMAL LOW (ref 13.0–17.0)
Immature Granulocytes: 1 %
Lymphocytes Relative: 12 %
Lymphs Abs: 1.5 10*3/uL (ref 0.7–4.0)
MCH: 30.1 pg (ref 26.0–34.0)
MCHC: 32.3 g/dL (ref 30.0–36.0)
MCV: 93.3 fL (ref 80.0–100.0)
Monocytes Absolute: 0.7 10*3/uL (ref 0.1–1.0)
Monocytes Relative: 6 %
Neutro Abs: 9.7 10*3/uL — ABNORMAL HIGH (ref 1.7–7.7)
Neutrophils Relative %: 77 %
Platelets: 96 10*3/uL — ABNORMAL LOW (ref 150–400)
RBC: 4.02 MIL/uL — ABNORMAL LOW (ref 4.22–5.81)
RDW: 17.8 % — ABNORMAL HIGH (ref 11.5–15.5)
WBC: 12.4 10*3/uL — ABNORMAL HIGH (ref 4.0–10.5)
nRBC: 0 % (ref 0.0–0.2)

## 2024-08-10 LAB — BASIC METABOLIC PANEL WITH GFR
Anion gap: 13 (ref 5–15)
BUN: 18 mg/dL (ref 8–23)
CO2: 23 mmol/L (ref 22–32)
Calcium: 9 mg/dL (ref 8.9–10.3)
Chloride: 100 mmol/L (ref 98–111)
Creatinine, Ser: 1.51 mg/dL — ABNORMAL HIGH (ref 0.61–1.24)
GFR, Estimated: 52 mL/min — ABNORMAL LOW
Glucose, Bld: 117 mg/dL — ABNORMAL HIGH (ref 70–99)
Potassium: 4.4 mmol/L (ref 3.5–5.1)
Sodium: 136 mmol/L (ref 135–145)

## 2024-08-10 LAB — LACTIC ACID, PLASMA: Lactic Acid, Venous: 2 mmol/L (ref 0.5–1.9)

## 2024-08-10 MED ORDER — SODIUM CHLORIDE 0.9 % IV SOLN
2.0000 g | Freq: Once | INTRAVENOUS | Status: AC
  Administered 2024-08-10: 2 g via INTRAVENOUS
  Filled 2024-08-10: qty 20

## 2024-08-10 MED ORDER — LACTATED RINGERS IV BOLUS (SEPSIS): 500.0000 mL | Freq: Once | INTRAVENOUS | Status: DC

## 2024-08-10 MED ORDER — LACTATED RINGERS IV BOLUS (SEPSIS): 1000.0000 mL | Freq: Once | INTRAVENOUS | Status: DC

## 2024-08-10 MED ORDER — LACTATED RINGERS IV BOLUS (SEPSIS)
1000.0000 mL | Freq: Once | INTRAVENOUS | Status: AC
  Administered 2024-08-10: 1000 mL via INTRAVENOUS

## 2024-08-10 MED ORDER — MORPHINE SULFATE (PF) 4 MG/ML IV SOLN: 4.0000 mg | Freq: Once | INTRAVENOUS | Status: AC

## 2024-08-10 MED ORDER — ONDANSETRON HCL 4 MG/2ML IJ SOLN
4.0000 mg | INTRAMUSCULAR | Status: AC
  Administered 2024-08-11: 4 mg via INTRAVENOUS
  Filled 2024-08-10: qty 2

## 2024-08-10 MED ORDER — VANCOMYCIN HCL IN DEXTROSE 1-5 GM/200ML-% IV SOLN
1000.0000 mg | Freq: Once | INTRAVENOUS | Status: AC
  Administered 2024-08-11: 1000 mg via INTRAVENOUS
  Filled 2024-08-10: qty 200

## 2024-08-10 NOTE — ED Provider Notes (Signed)
 "  [facility] Provider Note    Event Date/Time   First MD Initiated Contact with Patient 08/10/24 2258     (approximate)   History   Wound Check   HPI Cameron [name] is a 64 y.o. male who presents with a wound infection about 1 month after having a vascular surgery bypass graft in his left leg.  His surgeon is Dr. Marea.  He has been struggling with wound infection of the left lower leg since the surgery and spent about 2 weeks in a rehab facility.  He has been home about a week.  He came back to the emergency department within the last week, 1 day after leaving the rehab facility, and was started on 2 antibiotics (Keflex  and Bactrim ) but did not meet criteria for admission at that time.  He has not improved after being on the antibiotics and has developed increasing pain, redness, and swelling.  He said that part of the surgical wound has opened and is draining material.  He went to vascular surgery clinic today and was seen by Ms. Orvin Daring, Vascular NP.  She took cultures and sent him to the emergency department with plan for admission with IV antibiotics and possible surgical debridement.  Patient reports no fever, nausea, vomiting, chest pain, nor shortness of breath.  Worsening pain, some swelling.     Physical Exam   Triage Vital Signs: ED Triage Vitals  Encounter Vitals Group     BP 08/10/24 1913 124/70     Girls Systolic BP Percentile --      Girls Diastolic BP Percentile --      Boys Systolic BP Percentile --      Boys Diastolic BP Percentile --      Pulse Rate 08/10/24 1913 94     Resp 08/10/24 1913 18     Temp 08/10/24 1914 98.8 F (37.1 C)     Temp Source 08/10/24 1914 Oral     SpO2 08/10/24 1913 100 %     Weight 08/10/24 1911 77.1 kg (170 lb)     Height 08/10/24 1911 1.702 m (5' 7)     Head Circumference --      Peak Flow --      Pain Score 08/10/24 1911 8     Pain Loc --      Pain Education --      Exclude from Growth Chart --      Most recent vital signs: Vitals:   08/10/24 2335 08/11/24 0040  BP:  (!) 110/52  Pulse:  87  Resp:  20  Temp: 97.9 F (36.6 C)   SpO2:  100%    General: Awake, no distress.  CV:  Good peripheral perfusion.  Borderline tachycardia, regular rhythm. Resp:  Normal effort. Speaking easily and comfortably, no accessory muscle usage nor intercostal retractions.   Abd:  No distention.  Other:  Obvious cellulitis of left lower extremity extending above the knee on the medial side accounting for more than 50% involvement of his lower extremity.  There is some wound dehiscence on the inferior edge of the wound and staples are still otherwise in place.  Wound cultures were already taken at the vascular surgery visit.     ED Results / Procedures / Treatments   Labs (all labs ordered are listed, but only abnormal results are displayed) Labs Reviewed  CBC WITH DIFFERENTIAL/PLATELET - Abnormal; Notable for the following components:      Result Value  WBC 12.4 (*)    RBC 4.02 (*)    Hemoglobin 12.1 (*)    HCT 37.5 (*)    RDW 17.8 (*)    Platelets 96 (*)    Neutro Abs 9.7 (*)    Abs Immature Granulocytes 0.13 (*)    All other components within normal limits  BASIC METABOLIC PANEL WITH GFR - Abnormal; Notable for the following components:   Glucose, Bld 117 (*)    Creatinine, Ser 1.51 (*)    GFR, Estimated 52 (*)    All other components within normal limits  LACTIC ACID, PLASMA - Abnormal; Notable for the following components:   Lactic Acid, Venous 2.0 (*)    All other components within normal limits  CULTURE, BLOOD (ROUTINE X 2)  CULTURE, BLOOD (ROUTINE X 2)  PROTIME-INR  URINALYSIS, W/ REFLEX TO CULTURE (INFECTION SUSPECTED)     EKG  ED ECG REPORT I, Darleene Dome, the attending physician, personally viewed and interpreted this ECG.  Date: 08/11/2024 EKG Time: 1:49 AM Rate: 74 Rhythm: normal sinus rhythm QRS Axis: normal Intervals: normal ST/T Wave abnormalities:  normal Narrative Interpretation: no evidence of acute ischemia    RADIOLOGY I independently viewed and interpreted the patient's chest x-ray and I see no evidence of pneumonia or pulmonary edema.  I also read the radiologist's report, which confirmed no acute findings.   PROCEDURES:  Critical Care performed: Yes, see critical care procedure note(s)  .Critical Care  Performed by: Dome Darleene, MD Authorized by: Dome Darleene, MD   Critical care provider statement:    Critical care time (minutes):  30   Critical care time was exclusive of:  Separately billable procedures and treating other patients   Critical care was necessary to treat or prevent imminent or life-threatening deterioration of the following conditions:  Sepsis   Critical care was time spent personally by me on the following activities:  Development of treatment plan with patient or surrogate, evaluation of patient's response to treatment, examination of patient, obtaining history from patient or surrogate, ordering and performing treatments and interventions, ordering and review of laboratory studies, ordering and review of radiographic studies, pulse oximetry, re-evaluation of patient's condition and review of old charts     IMPRESSION / MDM / ASSESSMENT AND PLAN / ED COURSE  I reviewed the triage vital signs and the nursing notes.                              Differential diagnosis includes, but is not limited to, sepsis, wound infection, cellulitis, less likely DVT  Patient's presentation is most consistent with acute presentation with potential threat to life or bodily function.  Labs/studies ordered (see ED course for additional labs and studies that may have been added later): Blood cultures x 2, urinalysis, BMP, lactic acid, CBC with differential, pro time-INR, chest x-ray, EKG  Interventions/Medications given:  Medications  vancomycin  (VANCOCIN ) IVPB 1000 mg/200 mL premix (1,000 mg Intravenous New  Bag/Given 08/11/24 0018)  morphine  (PF) 4 MG/ML injection 4 mg (has no administration in time range)  ondansetron  (ZOFRAN ) injection 4 mg (has no administration in time range)  lactated ringers  bolus 1,000 mL (0 mLs Intravenous Stopped 08/10/24 2353)  cefTRIAXone  (ROCEPHIN ) 2 g in sodium chloride  0.9 % 100 mL IVPB (0 g Intravenous Stopped 08/10/24 2354)    (Note:  hospital course my include additional interventions and/or labs/studies not listed above.)   Patient has a wound infection and  vascular surgery is recommending admission.  I concur with this necessity particularly given that he has failed outpatient antibiotic treatment.  He technically meets sepsis criteria with a heart rate greater than 90 and a leukocytosis of 12.4 and a lactic acid of 2.0.  However, he has CHF with a reduced ejection fraction and he does not appear consistent with severe sepsis and I fear that ordering the full 2.5 L of fluid for 30 mL/kg IV fluid bolus would cause more harm than good.  Therefore I have begun resuscitation with 1 L and will defer to the hospitalist team for additional fluids if they deem necessary.   Clinical Course as of 08/11/24 0109  Wed Aug 10, 2024  2314 I am consulting the hospitalist team for admission.   [CF]  2331 I consulted by phone with the admitting hospitalist, and they will admit the patient - Dr. Lawence [CF]    Clinical Course User Index [CF] Gordan Huxley, MD     FINAL CLINICAL IMPRESSION(S) / ED DIAGNOSES   Final diagnoses:  Sepsis without acute organ dysfunction, due to unspecified organism [facility])  Left leg cellulitis  Wound infection after surgery  Chronic kidney disease, unspecified CKD stage  Chronic congestive heart failure, unspecified heart failure type (HCC)     Rx / DC Orders   ED Discharge Orders     None        Note:  This document was prepared using Dragon voice recognition software and may include unintentional dictation errors.   Gordan Huxley,  MD 08/11/24 [phone]  "

## 2024-08-10 NOTE — ED Triage Notes (Addendum)
 Pt reports he was told to come to ER today after seeing Dr. Marea for a follow up wound check. Pt reports he has been on abx for left lower leg wound that began following a bypass surgery on 1/7. Pts leg appears red and swollen. Wound still has staples in it, area of wound appear to be dehisced. Wound leaking.

## 2024-08-10 NOTE — Progress Notes (Signed)
 CODE SEPSIS - PHARMACY COMMUNICATION  **Broad Spectrum Antibiotics should be administered within 1 hour of Sepsis diagnosis**  Time Code Sepsis Called/Page Received: 2302  Antibiotics Ordered: Ceftriaxone  & Vancomycin   Time of 1st antibiotic administration: 2324  Rankin CANDIE Dills, PharmD, [facility] 08/10/2024 11:55 PM

## 2024-08-10 NOTE — Progress Notes (Signed)
 "  Subjective:    Patient ID: Cameron [name], male    DOB: April 20, 1961, 64 y.o.   MRN: [id number] Chief Complaint  Patient presents with   Follow-up    2 weeks (08/03/2024); in 2-3 weeks with ABI and wound check     HPI  Discussed the use of AI scribe software for clinical note transcription with the patient, who gave verbal consent to proceed.  History of Present Illness Cameron [name] is a 64 year old male with peripheral artery disease status post left lower extremity vascular bypass and graft placement who presents with persistent postoperative wound drainage.  He has had continuous drainage from the left lower extremity surgical wound since the procedure, requiring frequent dressing changes and additional absorbent pads due to high output. The lower portion of the wound remains actively draining, while the groin portion is dry. Wet bandages exacerbate burning and aching at the wound site, with temporary relief achieved by cleaning with soapy water and applying a dry dressing. He has improvised with bed pads and socks to maintain wound dryness when dressing changes were delayed during a recent rehabilitation facility stay.  He is currently taking two oral antibiotics for wound infection, prescribed after recent laboratory evaluation. Initial management considered hospitalization and intravenous antibiotics, but he was discharged home with oral therapy. Despite this, he remains uncertain of the antibiotics' efficacy, as significant wound drainage persists. The most recent dressing change was performed this morning. He denies fever, chills, or other systemic symptoms.  Over the past 2-3 days, he has experienced increased pain in the leg, particularly with ambulation, and persistent swelling. The wound has become more erythematous and now has an area of black discoloration, concerning for necrotic tissue inside the dehiscence.SABRA He attributes the infection risk to inadequate dressing changes  during his rehabilitation stay.  He continues to have burning and tingling in the plantar aspect of the foot, consistent with neuropathy from prior poor perfusion. He is taking gabapentin  for these symptoms. He notes the foot now feels warm, which he associates with improved perfusion following the vascular intervention.  He has been ambulatory and on his feet more recently, which may have contributed to increased wound drainage. He remains proactive in wound care at home, purchasing extra supplies to maintain wound dryness and cleanliness.    Results Diagnostic Ankle arterial flow measurement (08/10/2024): 0.87, increased from 0.60 previously Toe arterial flow measurement (08/10/2024): 0.52, increased from 0.36 previously   Staple removal from surgical wound Staples removed from areas of the wound not providing structural support. Wound with significant drainage, erythema, swelling, and area of black necrotic tissue at the groin. Seroma palpated at the groin. Remaining staples left in place where still providing support.  Application of Steri-Strips and wound dressing Steri-Strips applied to wound for additional support. Dressing placed over draining areas to control exudate.   Review of Systems  Cardiovascular:  Positive for leg swelling.  Skin:  Positive for wound.  All other systems reviewed and are negative.      Objective:   Physical Exam Vitals reviewed.  HENT:     Head: Normocephalic.  Cardiovascular:     Rate and Rhythm: Normal rate.  Pulmonary:     Effort: Pulmonary effort is normal.  Musculoskeletal:     Left lower leg: Edema present.  Skin:    General: Skin is warm and dry.  Neurological:     Mental Status: He is alert and oriented to person, place, and time.  Psychiatric:  Mood and Affect: Mood normal.        Behavior: Behavior normal.        Thought Content: Thought content normal.        Judgment: Judgment normal.     Physical Exam SKIN: Leg  with drainage, swelling, erythema, and black discoloration. Groin with seroma.    BP 101/69   Pulse 83   Ht 5' 7 (1.702 m)   Wt 169 lb 9.6 oz (76.9 kg)   BMI 26.56 kg/m   Past Medical History:  Diagnosis Date   Arthritis    CHF (congestive heart failure) (HCC)    Coronary artery disease    a. 12/17 PCI with DES to pLAD with resdiual disease (RX therapy)   Depression    Global hypokinesis of left ventricle    Gout    Grade II diastolic dysfunction    History of kidney stones    History of percutaneous coronary intervention 2017   Hyperlipidemia    Hypertension    Left atrial dilation    Neuromuscular disorder (HCC)    leg numb/ can walk by self   PAD (peripheral artery disease)    2 stents in left leg   Right atrial dilation    S/P drug eluting coronary stent placement 2017   STEMI (ST elevation myocardial infarction) (HCC) 2017   Substance abuse (HCC)    alcohol and marijuana    Social History   Socioeconomic History   Marital status: Divorced    Spouse name: Not on file   Number of children: Not on file   Years of education: Not on file   Highest education level: Not on file  Occupational History   Not on file  Tobacco Use   Smoking status: Every Day    Current packs/day: 1.00    Types: Cigarettes   Smokeless tobacco: Never  Vaping Use   Vaping status: Never Used  Substance and Sexual Activity   Alcohol use: Not Currently   Drug use: Yes    Frequency: 1.0 times per week    Types: Marijuana    Comment: twice monthly   Sexual activity: Not Currently  Other Topics Concern   Not on file  Social History Narrative   Not on file   Social Drivers of Health   Tobacco Use: High Risk (08/10/2024)   Patient History    Smoking Tobacco Use: Every Day    Smokeless Tobacco Use: Never    Passive Exposure: Not on file  Financial Resource Strain: Low Risk  (03/24/2024)   Received from [facility]   Overall Financial Resource Strain (CARDIA)     Difficulty of Paying Living Expenses: Not hard at all  Food Insecurity: Food Insecurity Present (07/11/2024)   Epic    Worried About Programme Researcher, Broadcasting/film/video in the Last Year: Often true    Ran Out of Food in the Last Year: Never true  Transportation Needs: No Transportation Needs (07/11/2024)   Epic    Lack of Transportation (Medical): No    Lack of Transportation (Non-Medical): No  Physical Activity: Not on file  Stress: Not on file  Social Connections: Socially Isolated (07/11/2024)   Social Connection and Isolation Panel    Frequency of Communication with Friends and Family: Never    Frequency of Social Gatherings with Friends and Family: Never    Attends Religious Services: Never    Database Administrator or Organizations: No    Attends Banker Meetings:  Never    Marital Status: Divorced  Catering Manager Violence: Not At Risk (07/11/2024)   Epic    Fear of Current or Ex-Partner: No    Emotionally Abused: No    Physically Abused: No    Sexually Abused: No  Depression (PHQ2-9): Not on file  Alcohol Screen: Not on file  Housing: Low Risk (07/11/2024)   Epic    Unable to Pay for Housing in the Last Year: No    Number of Times Moved in the Last Year: 1    Homeless in the Last Year: No  Utilities: At Risk (07/11/2024)   Epic    Threatened with loss of utilities: Yes  Health Literacy: Not on file    Past Surgical History:  Procedure Laterality Date   APPLICATION OF CELL SAVER N/A 07/13/2024   Procedure: APPLICATION OF CELL SAVER;  Surgeon: Marea Selinda RAMAN, MD;  Location: [facility];  Service: Vascular;  Laterality: N/A;   CARDIAC CATHETERIZATION N/A 06/17/2016   Procedure: Left Heart Cath and Coronary Angiography;  Surgeon: Dorn JINNY Lesches, MD;  Location: [facility] INVASIVE CV LAB;  Service: Cardiovascular;  Laterality: N/A;   CARDIAC CATHETERIZATION N/A 06/17/2016   Procedure: Coronary Stent Intervention;  Surgeon: Dorn JINNY Lesches, MD;  Location: [facility] INVASIVE CV LAB;  Service:  Cardiovascular;  Laterality: N/A;   cardiac stents     CATARACT EXTRACTION W/PHACO Right 05/30/2024   Procedure: PHACOEMULSIFICATION, CATARACT, WITH IOL INSERTION 2.49 00:23.5;  Surgeon: Myrna Adine Anes, MD;  Location: [facility];  Service: Ophthalmology;  Laterality: Right;   CATARACT EXTRACTION W/PHACO Left 06/13/2024   Procedure: PHACOEMULSIFICATION, CATARACT, WITH IOL INSERTION 2.52 00:27.8;  Surgeon: Myrna Adine Anes, MD;  Location: [facility];  Service: Ophthalmology;  Laterality: Left;   CERVICAL SPINE SURGERY     ENDARTERECTOMY FEMORAL Left 07/13/2024   Procedure: ENDARTERECTOMY, FEMORAL;  Surgeon: Marea Selinda RAMAN, MD;  Location: [facility];  Service: Vascular;  Laterality: Left;   ENDARTERECTOMY TIBIOPERONEAL  07/13/2024   Procedure: THROMBOENDARTERECTOMY, TIBIOPERONEAL TRUNK;  Surgeon: Marea Selinda RAMAN, MD;  Location: [facility];  Service: Vascular;;   FEMORAL-TIBIAL BYPASS GRAFT Left 07/13/2024   Procedure: CREATION, BYPASS, ARTERIAL, FEMORAL TO TIBIAL, USING GRAFT;  Surgeon: Marea Selinda RAMAN, MD;  Location: [facility];  Service: Vascular;  Laterality: Left;   IR NEPHROSTOMY PLACEMENT LEFT  07/12/2024   LOWER EXTREMITY ANGIOGRAPHY Left 06/25/2020   Procedure: LOWER EXTREMITY ANGIOGRAPHY;  Surgeon: Marea Selinda RAMAN, MD;  Location: [facility] INVASIVE CV LAB;  Service: Cardiovascular;  Laterality: Left;   LOWER EXTREMITY ANGIOGRAPHY Left 03/11/2023   Procedure: Lower Extremity Angiography;  Surgeon: Marea Selinda RAMAN, MD;  Location: [facility] INVASIVE CV LAB;  Service: Cardiovascular;  Laterality: Left;   LOWER EXTREMITY INTERVENTION Left 07/11/2024   Procedure: Lower Extremity Angiography;  Surgeon: Marea Selinda RAMAN, MD;  Location: [facility] INVASIVE CV LAB;  Service: Cardiovascular;  Laterality: Left;    Family History  Problem Relation Age of Onset   CAD Paternal Grandfather    Breast cancer Mother    Parkinson's disease Father    Alzheimer's disease Father    Hypertension Father    Diabetes Father      Allergies[1]     Latest Ref Rng & Units 08/04/2024    5:56 PM 07/17/2024   10:36 AM 07/15/2024    4:49 AM  CBC  WBC 4.0 - 10.5 K/uL 26.4  8.3  9.7   Hemoglobin 13.0 - 17.0 g/dL 86.6  87.1  87.7   Hematocrit 39.0 -  52.0 % 41.5  39.6  38.2   Platelets 150 - 400 K/uL 189  132  96       CMP     Component Value Date/Time   NA 138 08/04/2024 1756   NA 139 03/28/2014 0823   K 3.6 08/04/2024 1756   K 3.4 (L) 03/28/2014 0823   CL 99 08/04/2024 1756   CL 111 (H) 03/28/2014 0823   CO2 25 08/04/2024 1756   CO2 22 03/28/2014 0823   GLUCOSE 119 (H) 08/04/2024 1756   GLUCOSE 106 (H) 03/28/2014 0823   BUN 49 (H) 08/04/2024 1756   BUN 8 03/28/2014 0823   CREATININE 1.91 (H) 08/04/2024 1756   CREATININE 0.97 03/28/2014 0823   CALCIUM  8.9 08/04/2024 1756   CALCIUM  7.8 (L) 03/28/2014 0823   PROT 7.0 07/17/2024 1036   PROT 7.9 03/24/2014 0850   ALBUMIN  3.8 07/17/2024 1036   ALBUMIN  2.9 (L) 03/24/2014 0850   AST 21 07/17/2024 1036   AST 49 (H) 03/24/2014 0850   ALT 10 07/17/2024 1036   ALT 15 03/24/2014 0850   ALKPHOS 114 07/17/2024 1036   ALKPHOS 43 (L) 03/24/2014 0850   BILITOT 0.5 07/17/2024 1036   BILITOT 0.5 03/24/2014 0850   GFRNONAA 39 (L) 08/04/2024 1756   GFRNONAA >60 03/28/2014 0823     VAS US  ABI WITH/WO TBI Result Date: 06/27/2024  LOWER EXTREMITY DOPPLER STUDY Patient Name:  Cameron [name]  Date of Exam:   06/22/2024 Medical Rec #: [id number]          Accession #:    [id number] Date of Birth: Feb 10, 1961          Patient Gender: M Patient Age:   40 years Exam Location:  [facility] Procedure:      VAS US  ABI WITH/WO TBI Referring Phys: SELINDA DEW --------------------------------------------------------------------------------  Indications: Peripheral artery disease.  Vascular Interventions: 06/25/20: Left EIA & SFA/popliteal stents with TP trunk,                         peroneal & ATA angioplasties;                         03/11/23: Left SFA/popliteal  thrombectomy/PTA/stent with                         TP trunk & peroneal angioplasties;SABRA Performing Technologist: Elsie Churn RT, RDMS, RVT  Examination Guidelines: A complete evaluation includes at minimum, Doppler waveform signals and systolic blood pressure reading at the level of bilateral brachial, anterior tibial, and posterior tibial arteries, when vessel segments are accessible. Bilateral testing is considered an integral part of a complete examination. Photoelectric Plethysmograph (PPG) waveforms and toe systolic pressure readings are included as required and additional duplex testing as needed. Limited examinations for reoccurring indications may be performed as noted.  ABI Findings: +---------+------------------+-----+--------+-------+ Right    Rt Pressure (mmHg)IndexWaveformComment +---------+------------------+-----+--------+-------+ Brachial 92                                     +---------+------------------+-----+--------+-------+ PTA      73                0.79 biphasic        +---------+------------------+-----+--------+-------+ DP       86  0.93 biphasic        +---------+------------------+-----+--------+-------+ Great Toe57                0.62 Abnormal        +---------+------------------+-----+--------+-------+ +---------+------------------+-----+-------------------+--------------+ Left     Lt Pressure (mmHg)IndexWaveform           Comment        +---------+------------------+-----+-------------------+--------------+ Brachial 89                                                       +---------+------------------+-----+-------------------+--------------+ PTA      55                0.60 dampened monophasicvia collateral +---------+------------------+-----+-------------------+--------------+ PERO     50                0.54 dampened monophasic               +---------+------------------+-----+-------------------+--------------+  DP       46                0.50 dampened monophasic               +---------+------------------+-----+-------------------+--------------+ Great Toe33                0.36 Abnormal                          +---------+------------------+-----+-------------------+--------------+ +-------+-----------+-----------+------------+------------+ ABI/TBIToday's ABIToday's TBIPrevious ABIPrevious TBI +-------+-----------+-----------+------------+------------+ Right  0.93       0.62       0.93        NA           +-------+-----------+-----------+------------+------------+ Left   0.60       0.36       0.51        NA           +-------+-----------+-----------+------------+------------+ TOES Findings: +----------+---------------+---------------+-------+ Right ToesPressure (mmHg)Waveform       Comment +----------+---------------+---------------+-------+ 1st Digit                Abnormal               +----------+---------------+---------------+-------+ 2nd Digit                mildly abnormal        +----------+---------------+---------------+-------+ 3rd Digit                mildly abnormal        +----------+---------------+---------------+-------+ 4th Digit                mildly abnormal        +----------+---------------+---------------+-------+ 5th Digit                Normal                 +----------+---------------+---------------+-------+  +---------+---------------+-----------------+-------+ Left ToesPressure (mmHg)Waveform         Comment +---------+---------------+-----------------+-------+ 1st Digit               severely dampened        +---------+---------------+-----------------+-------+ 2nd Digit               severely dampened        +---------+---------------+-----------------+-------+ 3rd Digit  severely dampened        +---------+---------------+-----------------+-------+ 4th Digit               severely dampened         +---------+---------------+-----------------+-------+ 5th Digit               severely dampened        +---------+---------------+-----------------+-------+  Bilateral ABIs appear essentially unchanged compared to prior study on 03/10/23 done at [facility] prior to last intervention.  Summary: Right: Resting right ankle-brachial index indicates mild right lower extremity arterial disease. The right toe-brachial index is abnormal.  Left: Resting left ankle-brachial index indicates moderate left lower extremity arterial disease. The left toe-brachial index is abnormal.  *See table(s) above for measurements and observations.  Electronically signed by Selinda Gu MD on 06/27/2024 at 7:54:32 AM.    Final        Assessment & Plan:   1. Other postoperative complication of skin (Primary) Postoperative wound infection with necrosis of left lower extremity vascular graft site Draining, erythematous, and necrotic wound at graft site with persistent drainage, pain, swelling, and discoloration. Concern for infection, necrosis, and graft involvement. Oral antibiotics ineffective. Hospital admission required for advanced management. - Removed non-essential staples from wound. - Notified emergency room for admission. - Instructed him to pack and present to emergency room. - Anticipated IV antibiotics and possible surgical debridement.   2. PAD (peripheral artery disease) Peripheral artery disease with neuropathy Peripheral artery disease with neuropathy causing burning and tingling in foot. Symptoms predate surgery. Gabapentin  used for management. Improvement possible with enhanced blood flow, resolution uncertain. - Continued gabapentin  for neuropathic pain. - Educated on potential improvement with blood flow, resolution uncertain.     Medications Ordered Prior to Encounter[2]  There are no Patient Instructions on file for this visit. No follow-ups on file.   Atleigh Gruen E Kellon Chalk, NP      [1] No Known  Allergies [2]  Current Outpatient Medications on File Prior to Visit  Medication Sig Dispense Refill   acetaminophen  (TYLENOL ) 325 MG tablet Take 2 tablets (650 mg total) by mouth every 6 (six) hours as needed for moderate pain, mild pain, fever or headache.     allopurinol  (ZYLOPRIM ) 300 MG tablet Take 300 mg by mouth daily.     aspirin  EC 81 MG tablet Take 1 tablet (81 mg total) by mouth daily. Swallow whole. 30 tablet 12   atorvastatin  (LIPITOR ) 80 MG tablet Take 1 tablet (80 mg total) by mouth daily.     cephALEXin  (KEFLEX ) 500 MG capsule Take 1 capsule (500 mg total) by mouth 3 (three) times daily. 21 capsule 0   colchicine  0.6 MG tablet Take 0.6 mg by mouth.     collagenase  (SANTYL ) 250 UNIT/GM ointment Apply topically.     dapagliflozin  propanediol (FARXIGA ) 10 MG TABS tablet Take 10 mg by mouth daily.     ELIQUIS  5 MG TABS tablet Take 1 tablet by mouth 2 (two) times daily.     furosemide  (LASIX ) 40 MG tablet Take 40 mg by mouth 2 (two) times daily.     gabapentin  (NEURONTIN ) 300 MG capsule Take 300 mg by mouth 3 (three) times daily.     ibuprofen (ADVIL) 200 MG tablet Take 200 mg by mouth every 6 (six) hours as needed for headache or mild pain.     metoprolol  succinate (TOPROL -XL) 25 MG 24 hr tablet Take 12.5 mg by mouth.     oxyCODONE  (OXY IR/ROXICODONE ) 5 MG immediate release tablet  Take 1 tablet (5 mg total) by mouth every 6 (six) hours as needed. 30 tablet 0   Polyethylene Glycol 4500 POWD 1 application  by Does not apply route as needed.     predniSONE  (DELTASONE ) 20 MG tablet Take 20 mg by mouth 2 (two) times daily.     spironolactone  (ALDACTONE ) 25 MG tablet Take 25 mg by mouth 2 (two) times daily.     sulfamethoxazole -trimethoprim  (BACTRIM  DS) 800-160 MG tablet Take 1 tablet by mouth 2 (two) times daily. 14 tablet 0   [DISCONTINUED] isosorbide  mononitrate (IMDUR ) 60 MG 24 hr tablet Take 1 tablet (60 mg total) by mouth daily. 30 tablet 1   No current facility-administered  medications on file prior to visit.   "

## 2024-08-10 NOTE — ED Notes (Signed)
Pt's wound re-dressed at this time

## 2024-08-10 NOTE — Progress Notes (Signed)
 Elink monitoring for the code sepsis protocol.

## 2024-08-11 DIAGNOSIS — I2510 Atherosclerotic heart disease of native coronary artery without angina pectoris: Secondary | ICD-10-CM

## 2024-08-11 DIAGNOSIS — I480 Paroxysmal atrial fibrillation: Secondary | ICD-10-CM

## 2024-08-11 DIAGNOSIS — E785 Hyperlipidemia, unspecified: Secondary | ICD-10-CM

## 2024-08-11 DIAGNOSIS — F32A Depression, unspecified: Secondary | ICD-10-CM

## 2024-08-11 LAB — BASIC METABOLIC PANEL WITH GFR
Anion gap: 8 (ref 5–15)
BUN: 13 mg/dL (ref 8–23)
CO2: 24 mmol/L (ref 22–32)
Calcium: 7.8 mg/dL — ABNORMAL LOW (ref 8.9–10.3)
Chloride: 106 mmol/L (ref 98–111)
Creatinine, Ser: 1.3 mg/dL — ABNORMAL HIGH (ref 0.61–1.24)
GFR, Estimated: >60 mL/min
Glucose, Bld: 99 mg/dL (ref 70–99)
Potassium: 4.1 mmol/L (ref 3.5–5.1)
Sodium: 138 mmol/L (ref 135–145)

## 2024-08-11 LAB — CBC
HCT: 30.9 % — ABNORMAL LOW (ref 39.0–52.0)
Hemoglobin: 9.9 g/dL — ABNORMAL LOW (ref 13.0–17.0)
MCH: 30 pg (ref 26.0–34.0)
MCHC: 32 g/dL (ref 30.0–36.0)
MCV: 93.6 fL (ref 80.0–100.0)
Platelets: 81 10*3/uL — ABNORMAL LOW (ref 150–400)
RBC: 3.3 MIL/uL — ABNORMAL LOW (ref 4.22–5.81)
RDW: 17.7 % — ABNORMAL HIGH (ref 11.5–15.5)
WBC: 9.3 10*3/uL (ref 4.0–10.5)
nRBC: 0 % (ref 0.0–0.2)

## 2024-08-11 LAB — PROTIME-INR
INR: 1 (ref 0.8–1.2)
INR: 1 (ref 0.8–1.2)
Prothrombin Time: 13.4 s (ref 11.4–15.2)
Prothrombin Time: 14.2 s (ref 11.4–15.2)

## 2024-08-11 LAB — VAS US ABI WITH/WO TBI
Left ABI: 0.87
Right ABI: 0.81

## 2024-08-11 LAB — LACTIC ACID, PLASMA: Lactic Acid, Venous: 0.9 mmol/L (ref 0.5–1.9)

## 2024-08-11 LAB — CORTISOL-AM, BLOOD: Cortisol - AM: 11.1 ug/dL (ref 6.7–22.6)

## 2024-08-11 MED ORDER — FUROSEMIDE 40 MG PO TABS: 40.0000 mg | ORAL_TABLET | Freq: Two times a day (BID) | ORAL | Status: DC

## 2024-08-11 MED ORDER — ACETAMINOPHEN 325 MG PO TABS: 650.0000 mg | ORAL_TABLET | Freq: Four times a day (QID) | ORAL | Status: AC | PRN

## 2024-08-11 MED ORDER — SODIUM CHLORIDE 0.9 % IV SOLN
2.0000 g | Freq: Two times a day (BID) | INTRAVENOUS | Status: AC
  Administered 2024-08-11 – 2024-08-12 (×3): 2 g via INTRAVENOUS
  Filled 2024-08-11 (×4): qty 12.5

## 2024-08-11 MED ORDER — MIDODRINE HCL 5 MG PO TABS
10.0000 mg | ORAL_TABLET | Freq: Three times a day (TID) | ORAL | Status: AC
  Administered 2024-08-11 – 2024-08-12 (×7): 10 mg via ORAL
  Filled 2024-08-11 (×7): qty 2

## 2024-08-11 MED ORDER — GABAPENTIN 300 MG PO CAPS
300.0000 mg | ORAL_CAPSULE | Freq: Three times a day (TID) | ORAL | Status: AC
  Administered 2024-08-11 – 2024-08-12 (×6): 300 mg via ORAL
  Filled 2024-08-11 (×6): qty 1

## 2024-08-11 MED ORDER — ASPIRIN 81 MG PO TBEC
81.0000 mg | DELAYED_RELEASE_TABLET | Freq: Every day | ORAL | Status: AC
  Administered 2024-08-11 – 2024-08-12 (×2): 81 mg via ORAL
  Filled 2024-08-11 (×2): qty 1

## 2024-08-11 MED ORDER — MIDODRINE HCL 5 MG PO TABS
10.0000 mg | ORAL_TABLET | Freq: Once | ORAL | Status: AC
  Administered 2024-08-11: 10 mg via ORAL
  Filled 2024-08-11: qty 2

## 2024-08-11 MED ORDER — ALLOPURINOL 300 MG PO TABS
300.0000 mg | ORAL_TABLET | Freq: Every day | ORAL | Status: AC
  Administered 2024-08-11 – 2024-08-12 (×2): 300 mg via ORAL
  Filled 2024-08-11: qty 1
  Filled 2024-08-11: qty 3
  Filled 2024-08-11: qty 1
  Filled 2024-08-11: qty 3

## 2024-08-11 MED ORDER — ACETAMINOPHEN 650 MG RE SUPP: 650.0000 mg | Freq: Four times a day (QID) | RECTAL | Status: AC | PRN

## 2024-08-11 MED ORDER — OXYCODONE HCL 5 MG PO TABS
5.0000 mg | ORAL_TABLET | Freq: Four times a day (QID) | ORAL | Status: AC | PRN
  Administered 2024-08-11 – 2024-08-12 (×5): 5 mg via ORAL
  Filled 2024-08-11 (×5): qty 1

## 2024-08-11 MED ORDER — MAGNESIUM HYDROXIDE 400 MG/5ML PO SUSP: 30.0000 mL | Freq: Every day | ORAL | Status: AC | PRN

## 2024-08-11 MED ORDER — SODIUM CHLORIDE 0.9 % IV BOLUS
500.0000 mL | Freq: Once | INTRAVENOUS | Status: AC
  Administered 2024-08-11: 500 mL via INTRAVENOUS

## 2024-08-11 MED ORDER — SPIRONOLACTONE 25 MG PO TABS: 25.0000 mg | ORAL_TABLET | Freq: Two times a day (BID) | ORAL | Status: DC

## 2024-08-11 MED ORDER — DAPAGLIFLOZIN PROPANEDIOL 5 MG PO TABS
10.0000 mg | ORAL_TABLET | Freq: Every day | ORAL | Status: AC
  Administered 2024-08-11 – 2024-08-12 (×2): 10 mg via ORAL
  Filled 2024-08-11 (×2): qty 2
  Filled 2024-08-11 (×2): qty 1

## 2024-08-11 MED ORDER — VANCOMYCIN HCL 1250 MG/250ML IV SOLN
1250.0000 mg | INTRAVENOUS | Status: AC
  Administered 2024-08-11 – 2024-08-12 (×2): 1250 mg via INTRAVENOUS
  Filled 2024-08-11 (×2): qty 250

## 2024-08-11 MED ORDER — ONDANSETRON HCL 4 MG PO TABS: 4.0000 mg | ORAL_TABLET | Freq: Four times a day (QID) | ORAL | Status: AC | PRN

## 2024-08-11 MED ORDER — ENOXAPARIN SODIUM 40 MG/0.4ML IJ SOSY: 40.0000 mg | PREFILLED_SYRINGE | INTRAMUSCULAR | Status: DC

## 2024-08-11 MED ORDER — COLCHICINE 0.6 MG PO TABS: 0.6000 mg | ORAL_TABLET | ORAL | Status: AC | PRN

## 2024-08-11 MED ORDER — LACTATED RINGERS IV SOLN
150.0000 mL/h | INTRAVENOUS | Status: AC
  Administered 2024-08-11: 150 mL/h via INTRAVENOUS

## 2024-08-11 MED ORDER — ATORVASTATIN CALCIUM 20 MG PO TABS
80.0000 mg | ORAL_TABLET | Freq: Every day | ORAL | Status: AC
  Administered 2024-08-11 – 2024-08-12 (×2): 80 mg via ORAL
  Filled 2024-08-11 (×2): qty 4

## 2024-08-11 MED ORDER — ONDANSETRON HCL 4 MG/2ML IJ SOLN: 4.0000 mg | Freq: Four times a day (QID) | INTRAMUSCULAR | Status: AC | PRN

## 2024-08-11 MED ORDER — TRAZODONE HCL 50 MG PO TABS
25.0000 mg | ORAL_TABLET | Freq: Every evening | ORAL | Status: AC | PRN
  Administered 2024-08-11 – 2024-08-12 (×2): 25 mg via ORAL
  Filled 2024-08-11 (×2): qty 1

## 2024-08-11 MED ORDER — VANCOMYCIN HCL IN DEXTROSE 1-5 GM/200ML-% IV SOLN: 1000.0000 mg | Freq: Once | INTRAVENOUS | Status: DC

## 2024-08-11 MED ORDER — METOPROLOL SUCCINATE ER 25 MG PO TB24: 12.5000 mg | ORAL_TABLET | Freq: Every day | ORAL | Status: AC

## 2024-08-11 NOTE — Progress Notes (Signed)
" °  Progress Note   Patient: Cameron [name] FMW:[id number] DOB: 10-30-1960 DOA: 08/10/2024     1 DOS: the patient was seen and examined on 08/11/2024   Brief hospital course: Partly taken from H&P.  Cameron [name] is a 64 y.o. male with medical history significant for PAD status post left leg bypass, CHF, coronary artery disease, depression, gout, grade 2 diastolic dysfunction, hypertension and dyslipidemia, who presented to the emergency room with acute onset of worsening left leg erythema with associated warmth and tenderness at the site of his recent left leg vascular bypass.  He was seen today in vascular surgery office and referred to the ER for IV antibiotics after having wound culture drawn.   On presentation vital stable, labs with leukocytosis at 12.4, lactic acid 2.0.  Blood cultures were ordered and patient was started on cefepime  and vancomycin .  2/5: Vitals with borderline soft blood pressure at 88/56, holding home Lasix  and spironolactone , improved creatinine to 1.30, leukocytosis resolved but all cell lines decreased so likely some dilutional effect.  Preliminary blood cultures negative.  Assessment and Plan: * Sepsis due to cellulitis (HCC) History of PAD s/p bypass of left leg Patient met sepsis criteria with mild tachycardia and leukocytosis which has been resolved.  Preliminary blood cultures negative, wound cultures were obtained at vascular surgery office. Concern of wound infection after left leg bypass procedure. - Will continue antibiotic therapy with IV vancomycin  and cefepime . - Vascular surgery is on board  Coronary artery disease - Continue Toprol -XL and statin therapy.  Paroxysmal atrial fibrillation (HCC) - Will hold off Eliquis  pending potential surgical intervention. - This can be continued postoperatively. - Will continue Toprol -XL.  Dyslipidemia Will continue statin therapy.  Depression - We will continue Zoloft.   Subjective: Patient was resting  comfortably when seen today.  Pain seems bearable.  Physical Exam: Vitals:   08/11/24 0214 08/11/24 0343 08/11/24 0545 08/11/24 0943  BP: 95/61 (!) 84/62 (!) 88/56 (!) 85/60  Pulse: 97 83 80 73  Resp: 18 17  16   Temp: 97.9 F (36.6 C) 98.4 F (36.9 C) 97.9 F (36.6 C) 98.2 F (36.8 C)  TempSrc:   Oral   SpO2: 99% 93% 97% 98%  Weight:      Height:       General.  Well-developed gentleman, in no acute distress. Pulmonary.  Lungs clear bilaterally, normal respiratory effort. CV.  Regular rate and rhythm, no JVD, rub or murmur. Abdomen.  Soft, nontender, nondistended, BS positive. CNS.  Alert and oriented .  No focal neurologic deficit. Extremities.  No edema,  pulses intact and symmetrical.  Left mid leg with bandage. Psychiatry.  Judgment and insight appears normal.   Data Reviewed: Prior data reviewed  Family Communication: Discussed with patient  Disposition: Status is: Inpatient Remains inpatient appropriate because: Severity of illness  Planned Discharge Destination: Home  DVT prophylaxis.  SCDs Time spent: 50 minutes  This record has been created using Conservation officer, historic buildings. Errors have been sought and corrected,but may not always be located. Such creation errors do not reflect on the standard of care.   Author: Amaryllis Dare, MD 08/11/2024 2:38 PM  For on call review [email or web address].  "

## 2024-08-11 NOTE — Assessment & Plan Note (Addendum)
 History of PAD s/p bypass of left leg Patient met sepsis criteria with mild tachycardia and leukocytosis which has been resolved.  Preliminary blood cultures negative, wound cultures were obtained at vascular surgery office. Concern of wound infection after left leg bypass procedure. - Will continue antibiotic therapy with IV vancomycin  and cefepime . - Vascular surgery is on board

## 2024-08-11 NOTE — Assessment & Plan Note (Signed)
-   Continue Toprol -XL and statin therapy.

## 2024-08-11 NOTE — H&P (Addendum)
 "     [facility]   PATIENT NAME: Jahlil Ziller    MR#:  [id number]  DATE OF BIRTH:  08/08/1960  DATE OF ADMISSION:  08/10/2024  PRIMARY CARE PHYSICIAN: Lars Rea BROCKS, NP   Patient is coming from: Home  REQUESTING/REFERRING PHYSICIAN: Gordan Huxley, MD  CHIEF COMPLAINT:   Chief Complaint  Patient presents with   Wound Check    HISTORY OF PRESENT ILLNESS:  SAMIE REASONS is a 64 y.o. male with medical history significant for PAD status post left leg bypass, CHF, coronary artery disease, depression, gout, grade 2 diastolic dysfunction, hypertension and dyslipidemia, who presented to the emergency room with acute onset of worsening left leg erythema with associated warmth and tenderness at the site of his recent left leg vascular bypass.  He was seen today in vascular surgery office and referred to the ER for IV antibiotics after having wound culture drawn.  He denied any fever or chills.  No chest pain or palpitations.  No cough or wheezing or dyspnea.  No dysuria, oliguria or hematuria or flank pain.  ED Course: When he came to the ER, vital signs were within normal.  Labs revealed a blood glucose 117 and creatinine 1.51 lower than previous levels and lactic acid was 2.  CBC showed leukocytosis 12.4 with neutrophilia and minimal anemia and associated thrombocytopenia.  PT and INR were normal.  Blood cultures were drawn.  Wound culture was drawn earlier in the office. EKG as reviewed by me : EKG showed sinus rhythm with a rate of 74 with low voltage QRS and left anterior fascicular block.  It showed Q waves anteroseptally. Imaging: Portable chest x-ray showed no acute cardiopulmonary disease.  The patient was given IV vancomycin  in the ED.  He will be admitted to a medical telemetry bed for further evaluation and management. PAST MEDICAL HISTORY:   Past Medical History:  Diagnosis Date   Arthritis    CHF (congestive heart failure) (HCC)    Coronary artery disease    a.  12/17 PCI with DES to pLAD with resdiual disease (RX therapy)   Depression    Global hypokinesis of left ventricle    Gout    Grade II diastolic dysfunction    History of kidney stones    History of percutaneous coronary intervention 2017   Hyperlipidemia    Hypertension    Left atrial dilation    Neuromuscular disorder (HCC)    leg numb/ can walk by self   PAD (peripheral artery disease)    2 stents in left leg   Right atrial dilation    S/P drug eluting coronary stent placement 2017   STEMI (ST elevation myocardial infarction) (HCC) 2017   Substance abuse (HCC)    alcohol and marijuana    PAST SURGICAL HISTORY:   Past Surgical History:  Procedure Laterality Date   APPLICATION OF CELL SAVER N/A 07/13/2024   Procedure: APPLICATION OF CELL SAVER;  Surgeon: Marea Selinda RAMAN, MD;  Location: [facility];  Service: Vascular;  Laterality: N/A;   CARDIAC CATHETERIZATION N/A 06/17/2016   Procedure: Left Heart Cath and Coronary Angiography;  Surgeon: Dorn JINNY Lesches, MD;  Location: [facility];  Service: Cardiovascular;  Laterality: N/A;   CARDIAC CATHETERIZATION N/A 06/17/2016   Procedure: Coronary Stent Intervention;  Surgeon: Dorn JINNY Lesches, MD;  Location: [facility];  Service: Cardiovascular;  Laterality: N/A;   cardiac stents     CATARACT EXTRACTION W/PHACO Right 05/30/2024  Procedure: PHACOEMULSIFICATION, CATARACT, WITH IOL INSERTION 2.49 00:23.5;  Surgeon: Myrna Adine Anes, MD;  Location: [facility];  Service: Ophthalmology;  Laterality: Right;   CATARACT EXTRACTION W/PHACO Left 06/13/2024   Procedure: PHACOEMULSIFICATION, CATARACT, WITH IOL INSERTION 2.52 00:27.8;  Surgeon: Myrna Adine Anes, MD;  Location: [facility];  Service: Ophthalmology;  Laterality: Left;   CERVICAL SPINE SURGERY     ENDARTERECTOMY FEMORAL Left 07/13/2024   Procedure: ENDARTERECTOMY, FEMORAL;  Surgeon: Marea Selinda RAMAN, MD;  Location: [facility];  Service:  Vascular;  Laterality: Left;   ENDARTERECTOMY TIBIOPERONEAL  07/13/2024   Procedure: THROMBOENDARTERECTOMY, TIBIOPERONEAL TRUNK;  Surgeon: Marea Selinda RAMAN, MD;  Location: [facility];  Service: Vascular;;   FEMORAL-TIBIAL BYPASS GRAFT Left 07/13/2024   Procedure: CREATION, BYPASS, ARTERIAL, FEMORAL TO TIBIAL, USING GRAFT;  Surgeon: Marea Selinda RAMAN, MD;  Location: [facility];  Service: Vascular;  Laterality: Left;   IR NEPHROSTOMY PLACEMENT LEFT  07/12/2024   LOWER EXTREMITY ANGIOGRAPHY Left 06/25/2020   Procedure: LOWER EXTREMITY ANGIOGRAPHY;  Surgeon: Marea Selinda RAMAN, MD;  Location: [facility];  Service: Cardiovascular;  Laterality: Left;   LOWER EXTREMITY ANGIOGRAPHY Left 03/11/2023   Procedure: Lower Extremity Angiography;  Surgeon: Marea Selinda RAMAN, MD;  Location: [facility];  Service: Cardiovascular;  Laterality: Left;   LOWER EXTREMITY INTERVENTION Left 07/11/2024   Procedure: Lower Extremity Angiography;  Surgeon: Marea Selinda RAMAN, MD;  Location: [facility];  Service: Cardiovascular;  Laterality: Left;    SOCIAL HISTORY:   Social History   Tobacco Use   Smoking status: Every Day    Current packs/day: 1.00    Types: Cigarettes   Smokeless tobacco: Never  Substance Use Topics   Alcohol use: Not Currently    FAMILY HISTORY:   Family History  Problem Relation Age of Onset   CAD Paternal Grandfather    Breast cancer Mother    Parkinson's disease Father    Alzheimer's disease Father    Hypertension Father    Diabetes Father     DRUG ALLERGIES:  Allergies[1]  REVIEW OF SYSTEMS:   ROS As per history of present illness. All pertinent systems were reviewed above. Constitutional, HEENT, cardiovascular, respiratory, GI, GU, musculoskeletal, neuro, psychiatric, endocrine, integumentary and hematologic systems were reviewed and are otherwise negative/unremarkable except for positive findings mentioned above in the HPI.   MEDICATIONS AT HOME:   Prior to  Admission medications  Medication Sig Start Date End Date Taking? Authorizing Provider  acetaminophen  (TYLENOL ) 325 MG tablet Take 2 tablets (650 mg total) by mouth every 6 (six) hours as needed for moderate pain, mild pain, fever or headache. 03/12/23   Alexander, Natalie, DO  allopurinol  (ZYLOPRIM ) 300 MG tablet Take 300 mg by mouth daily.    [provider]  aspirin  EC 81 MG tablet Take 1 tablet (81 mg total) by mouth daily. Swallow whole. 03/13/23   Alexander, Natalie, DO  atorvastatin  (LIPITOR ) 80 MG tablet Take 1 tablet (80 mg total) by mouth daily. 02/21/20   Trudy Anthony HERO, MD  cephALEXin  (KEFLEX ) 500 MG capsule Take 1 capsule (500 mg total) by mouth 3 (three) times daily. 08/05/24   Viviann Pastor, MD  colchicine  0.6 MG tablet Take 0.6 mg by mouth. 03/24/24   [provider]  collagenase  (SANTYL ) 250 UNIT/GM ointment Apply topically. 08/20/20   [provider]  dapagliflozin  propanediol (FARXIGA ) 10 MG TABS tablet Take 10 mg by mouth daily. 03/24/24   [provider]  ELIQUIS  5 MG  TABS tablet Take 1 tablet by mouth 2 (two) times daily. 12/22/22   [provider]  furosemide  (LASIX ) 40 MG tablet Take 40 mg by mouth 2 (two) times daily. 06/06/24   [provider]  gabapentin  (NEURONTIN ) 300 MG capsule Take 300 mg by mouth 3 (three) times daily.    [provider]  ibuprofen (ADVIL) 200 MG tablet Take 200 mg by mouth every 6 (six) hours as needed for headache or mild pain.    [provider]  metoprolol  succinate (TOPROL -XL) 25 MG 24 hr tablet Take 12.5 mg by mouth. 03/24/24   [provider]  oxyCODONE  (OXY IR/ROXICODONE ) 5 MG immediate release tablet Take 1 tablet (5 mg total) by mouth every 6 (six) hours as needed. 07/20/24   Brown, Fallon E, NP  Polyethylene Glycol 4500 POWD 1 application  by Does not apply route as needed.    [provider]  predniSONE  (DELTASONE ) 20 MG tablet Take 20 mg by mouth 2 (two)  times daily. 05/03/24   [provider]  spironolactone  (ALDACTONE ) 25 MG tablet Take 25 mg by mouth 2 (two) times daily. 03/24/24   [provider]  sulfamethoxazole -trimethoprim  (BACTRIM  DS) 800-160 MG tablet Take 1 tablet by mouth 2 (two) times daily. 08/05/24   Viviann Pastor, MD  isosorbide  mononitrate (IMDUR ) 60 MG 24 hr tablet Take 1 tablet (60 mg total) by mouth daily. 02/04/18 05/30/19  Laurence Bridegroom, MD      VITAL SIGNS:  Blood pressure (!) 88/56, pulse 80, temperature 97.9 F (36.6 C), temperature source Oral, resp. rate 17, height 5' 7 (1.702 m), weight 77.1 kg, SpO2 97%.  PHYSICAL EXAMINATION:  Physical Exam  GENERAL:  64 y.o.-year-old patient lying in the bed with no acute distress.  EYES: Pupils equal, round, reactive to light and accommodation. No scleral icterus. Extraocular muscles intact.  HEENT: Head atraumatic, normocephalic. Oropharynx and nasopharynx clear.  NECK:  Supple, no jugular venous distention. No thyroid  enlargement, no tenderness.  LUNGS: Normal breath sounds bilaterally, no wheezing, rales,rhonchi or crepitation. No use of accessory muscles of respiration.  CARDIOVASCULAR: Regular rate and rhythm, S1, S2 normal. No murmurs, rubs, or gallops.  ABDOMEN: Soft, nondistended, nontender. Bowel sounds present. No organomegaly or mass.  EXTREMITIES: No pedal edema, cyanosis, or clubbing.  NEUROLOGIC: Cranial nerves II through XII are intact. Muscle strength 5/5 in all extremities. Sensation intact. Gait not checked.  PSYCHIATRIC: The patient is alert and oriented x 3.  Normal affect and good eye contact. SKIN: As shown below erythematous left leg wound with intact staples.  Intact healing left groin wound.       LABORATORY PANEL:   CBC Recent Labs  Lab 08/10/24 1920  WBC 12.4*  HGB 12.1*  HCT 37.5*  PLT 96*    ------------------------------------------------------------------------------------------------------------------  Chemistries  Recent Labs  Lab 08/10/24 1920  NA 136  K 4.4  CL 100  CO2 23  GLUCOSE 117*  BUN 18  CREATININE 1.51*  CALCIUM  9.0   ------------------------------------------------------------------------------------------------------------------  Cardiac Enzymes No results for input(s): TROPONINI in the last 168 hours. ------------------------------------------------------------------------------------------------------------------  RADIOLOGY:  DG Chest Port 1 View Result Date: 08/10/2024 EXAM: 1 VIEW(S) XRAY OF THE CHEST 08/10/2024 11:16:18 PM COMPARISON: 1 / 11 / 24 CLINICAL HISTORY: Questionable sepsis; evaluate for abnormality. FINDINGS: LUNGS AND PLEURA: No focal pulmonary opacity. No pleural effusion. No pneumothorax. HEART AND MEDIASTINUM: Coronary stent noted. No acute abnormality of the cardiac and mediastinal silhouettes. BONES AND SOFT TISSUES: Remote posterior right upper rib  fractures. IMPRESSION: 1. No acute cardiopulmonary abnormality. Electronically signed by: Norman Gatlin MD 08/10/2024 11:25 PM EST RP Workstation: [id number]      IMPRESSION AND PLAN:  Assessment and Plan: * Sepsis due to cellulitis (HCC) - This is secondary to infected left leg wound and left leg cellulitis. - He will be admitted to a telemetry bed. - Will continue antibiotic therapy with IV vancomycin  and cefepime . - Vascular surgery consult will be pained for potential need for debridement. - Will keep the patient n.p.o. - I notified Dr. Serene about the patient. - Sepsis is manifested by leukocytosis and tachycardia.   Depression - We will continue Zoloft.  Coronary artery disease - Continue Toprol -XL and statin therapy.  Paroxysmal atrial fibrillation (HCC) - Will hold off Eliquis  pending potential surgical intervention. - This can be continued  postoperatively. - Will continue Toprol -XL.  Dyslipidemia Will continue statin therapy.   DVT prophylaxis: SCDs. Advanced Care Planning:  Code Status: full code. Family Communication:  The plan of care was discussed in details with the patient (and family). I answered all questions. The patient agreed to proceed with the above mentioned plan. Further management will depend upon hospital course. Disposition Plan: Back to previous home environment Consults called: Vascular surgery. All the records are reviewed and case discussed with ED provider.  Status is: Inpatient    At the time of the admission, it appears that the appropriate admission status for this patient is inpatient.  This is judged to be reasonable and necessary in order to provide the required intensity of service to ensure the patient's safety given the presenting symptoms, physical exam findings and initial radiographic and laboratory data in the context of comorbid conditions.  The patient requires inpatient status due to high intensity of service, high risk of further deterioration and high frequency of surveillance required.  I certify that at the time of admission, it is my clinical judgment that the patient will require inpatient hospital care extending more than 2 midnights.                            Dispo: The patient is from: Home              Anticipated d/c is to: Home              Patient currently is not medically stable to d/c.              Difficult to place patient: No  Madison DELENA Peaches M.D on 08/11/2024 at 6:41 AM  [facility]   From 7 PM-7 AM, contact night-coverage [email or web address]  CC: Primary care physician; Lars Rea BROCKS, NP      [1] No Known Allergies "

## 2024-08-11 NOTE — Hospital Course (Addendum)
 Partly taken from H&P.  Cameron [name] is a 64 y.o. male with medical history significant for PAD status post left leg bypass, CHF, coronary artery disease, depression, gout, grade 2 diastolic dysfunction, hypertension and dyslipidemia, who presented to the emergency room with acute onset of worsening left leg erythema with associated warmth and tenderness at the site of his recent left leg vascular bypass.  He was seen today in vascular surgery office and referred to the ER for IV antibiotics after having wound culture drawn.   On presentation vital stable, labs with leukocytosis at 12.4, lactic acid 2.0.  Blood cultures were ordered and patient was started on cefepime  and vancomycin .  2/5: Vitals with borderline soft blood pressure at 88/56, holding home Lasix  and spironolactone , improved creatinine to 1.30, leukocytosis resolved but all cell lines decreased so likely some dilutional effect.  Preliminary blood cultures negative.  2/6: Hemodynamically stable, apparently no wound cultures were obtained at vascular surgery office.  Preliminary blood cultures remain negative.  Clinically improved cellulitis.  No surgical intervention needed at this time.

## 2024-08-11 NOTE — Assessment & Plan Note (Signed)
 Will continue statin therapy

## 2024-08-11 NOTE — Plan of Care (Signed)

## 2024-08-11 NOTE — Assessment & Plan Note (Addendum)
-   Will hold off Eliquis  pending potential surgical intervention. - This can be continued postoperatively. - Will continue Toprol -XL.

## 2024-08-11 NOTE — Progress Notes (Addendum)
 Pharmacy Antibiotic Note  Cameron [name] is a 64 y.o. male admitted on 08/10/2024 with cellulitis.  Pharmacy has been consulted for Cefepime  and Vancomycin  dosing.  Plan: Cefepime  2 gm q12hr per indication & renal fxn.  Pt given Vancomycin  1000 mg once. Vancomycin  1250 mg IV Q 24 hrs. Goal AUC 400-550. Expected AUC: 520.6 SCr used: 1.51  Follow up culture results to assess for antibiotic optimization. Monitor renal function to assess for any necessary antibiotic dosing changes. Pharmacy will continue to follow and will adjust abx dosing whenever warranted.  Temp (24hrs), Avg:98.4 F (36.9 C), Min:97.9 F (36.6 C), Max:98.8 F (37.1 C)   Recent Labs  Lab 08/04/24 1756 08/10/24 1920  WBC 26.4* 12.4*  CREATININE 1.91* 1.51*  LATICACIDVEN  --  2.0*    Estimated Creatinine Clearance: 46.8 mL/min (A) (by C-G formula based on SCr of 1.51 mg/dL (H)).    Allergies[1]  Antimicrobials this admission: 02/04 Ceftriaxone  >> x 1 dose 02/05 Vancomycin  >>  02/05 Cefepime  >>   Microbiology results: 2/05 BCx: Pending  Thank you for allowing pharmacy to be a part of this patients care.  Cameron [name], PharmD, MBA 08/11/2024 1:53 AM     [1] No Known Allergies

## 2024-08-11 NOTE — ED Notes (Signed)
 Attending messaged regarding BP softening.

## 2024-08-11 NOTE — Plan of Care (Signed)
 Left lower leg dressing change this morning.Patient had eaten  a sandwich at 4 am.Provider made aware and ordered npo.Patient npo since 4:30am.BP soft 82/42 other vitals is normal.Provider made aware Midodrine  and NS bolus given per order. Problem: Coping: Goal: Level of anxiety will decrease Outcome: Progressing   Problem: Skin Integrity: Goal: Risk for impaired skin integrity will decrease Outcome: Progressing   Problem: Pain Managment: Goal: General experience of comfort will improve and/or be controlled Outcome: Progressing   Problem: Fluid Volume: Goal: Hemodynamic stability will improve Outcome: Progressing   Problem: Clinical Measurements: Goal: Diagnostic test results will improve Outcome: Progressing Goal: Signs and symptoms of infection will decrease Outcome: Progressing   Problem: Respiratory: Goal: Ability to maintain adequate ventilation will improve Outcome: Progressing   Problem: Education: Goal: Knowledge of General Education information will improve Description: Including pain rating scale, medication(s)/side effects and non-pharmacologic comfort measures Outcome: Progressing   Problem: Health Behavior/Discharge Planning: Goal: Ability to manage health-related needs will improve Outcome: Progressing   Problem: Clinical Measurements: Goal: Ability to maintain clinical measurements within normal limits will improve Outcome: Progressing Goal: Will remain free from infection Outcome: Progressing Goal: Diagnostic test results will improve Outcome: Progressing Goal: Respiratory complications will improve Outcome: Progressing Goal: Cardiovascular complication will be avoided Outcome: Progressing   Problem: Activity: Goal: Risk for activity intolerance will decrease Outcome: Progressing   Problem: Nutrition: Goal: Adequate nutrition will be maintained Outcome: Progressing   Problem: Coping: Goal: Level of anxiety will decrease Outcome:  Progressing   Problem: Elimination: Goal: Will not experience complications related to bowel motility Outcome: Progressing Goal: Will not experience complications related to urinary retention Outcome: Progressing   Problem: Pain Managment: Goal: General experience of comfort will improve and/or be controlled Outcome: Progressing   Problem: Safety: Goal: Ability to remain free from injury will improve Outcome: Progressing   Problem: Skin Integrity: Goal: Risk for impaired skin integrity will decrease Outcome: Progressing   Problem: Education: Goal: Knowledge of General Education information will improve Description: Including pain rating scale, medication(s)/side effects and non-pharmacologic comfort measures Outcome: Progressing

## 2024-08-11 NOTE — Progress Notes (Signed)
 Patient well-known to our service several weeks status post left femoral to distal bypass.  Presents with some cellulitis of his left calf incision.  The calf incision is largely intact with a small amount of serous drainage weeping from a small opening at the distal portion of the incision.  There is significant erythema.  At this point, I would not recommend any surgical debridement or opening of the wound as that would likely expose his prosthetic bypass graft and create a much larger problem.  I would recommend leg elevation, IV antibiotics for a couple of days and continued monitoring.  Will follow.

## 2024-08-11 NOTE — Assessment & Plan Note (Signed)
-   We will continue Zoloft 

## 2024-08-12 LAB — CULTURE, BLOOD (ROUTINE X 2)
Culture: NO GROWTH
Culture: NO GROWTH

## 2024-08-12 MED ORDER — APIXABAN 5 MG PO TABS
5.0000 mg | ORAL_TABLET | Freq: Two times a day (BID) | ORAL | Status: AC
  Administered 2024-08-12 (×2): 5 mg via ORAL
  Filled 2024-08-12 (×2): qty 1

## 2024-08-12 NOTE — Plan of Care (Signed)

## 2024-08-12 NOTE — Discharge Instructions (Signed)
 Agency Name: [facility] Address: [address] [place], [place] [address] Phone: [phone] Website: [email or web address] Service(s) Offered: Housing services, self-sufficiency, congregate meal program, and individual development account program.  Agency Name: [employer] Address: [address] [place], [place] [address] Phone: [phone] Email: [email or web address]@[employer] .org Website: [email or web address].org Service(s) Offered: Housing the homeless, feeding the hungry, Company secretary, job and education related services.  Agency Name: [facility] Address: [address], [place], [place] [address] Phone: [phone] Email: [email or web address] Service(s) Offered: Counseling, problem pregnancy, advocacy for Hispanics, limited emergency financial assistance.  Agency Name: [employer] Address: [address] [place], [place] [address] Phone: [phone] Website: [email or web address] Service(s) Offered: Child support services; child welfare services; SNAP; Medicaid; work first family assistance; and aid with fuel,  rent, food and medicine.  Agency Name: [employer] Address: [address], [place], [place] [address] Phone: [phone] or [phone] Email: [email or web address] Service(s) Offered: Family services and transient assistance; emergency food, fuel, clothing, limited furniture, utilities; budget counseling, general counseling; give a kid a coat; thrift store; Christmas food and toys. Utility assistance, food pantry, rental  assistance, life sustaining medicine

## 2024-08-12 NOTE — TOC Initial Note (Signed)
 Transition of Care [facility]) - Initial/Assessment Note    Patient Details  Name: Cameron [name] MRN: [id number] Date of Birth: 1961-02-09  Transition of Care [facility]) CM/SW Contact:    Corean ONEIDA Haddock, RN Phone Number: 08/12/2024, 3:43 PM  Clinical Narrative:                  Admitted for: Sepsis cellulitis  Admitted from: Home alone PCP: Gwynn: Current home health/prior home health/DME: Patient with recent admission at [facility] rehab.  States he discharged on 08/03/24  Patient states that he does not feel like home health will be indicated at discharge.    Patient states at discharge he will need transport called through his insurance [employer]  [phone]  Per MD plan to dc tomorrow on oral antibiotics  Expected Discharge Plan: Home/Self Care Barriers to Discharge: Continued Medical Work up   Patient Goals and CMS Choice            Expected Discharge Plan and Services                                              Prior Living Arrangements/Services   Lives with:: Self              Current home services: DME    Activities of Daily Living   ADL Screening (condition at time of admission) Independently performs ADLs?: Yes (appropriate for developmental age) Is the patient deaf or have difficulty hearing?: No Does the patient have difficulty seeing, even when wearing glasses/contacts?: No Does the patient have difficulty concentrating, remembering, or making decisions?: No  Permission Sought/Granted                  Emotional Assessment              Admission diagnosis:  Left leg cellulitis [L03.116] Wound infection after surgery [T81.49XA] Sepsis due to cellulitis (HCC) [L03.90, A41.9] Chronic kidney disease, unspecified CKD stage [N18.9] Chronic congestive heart failure, unspecified heart failure type (HCC) [I50.9] Sepsis without acute organ dysfunction, due to unspecified organism [facility]) [A41.9] Patient Active Problem List    Diagnosis Date Noted   Dyslipidemia 08/11/2024   Paroxysmal atrial fibrillation (HCC) 08/11/2024   Coronary artery disease 08/11/2024   Depression 08/11/2024   Sepsis due to cellulitis (HCC) 08/10/2024   Bladder mass 07/22/2024   Hydronephrosis of left kidney 07/22/2024   Atherosclerosis of artery of extremity with rest pain (HCC) 07/11/2024   Ischemia of foot 03/10/2023   HFrEF (heart failure with reduced ejection fraction) (HCC) 03/10/2023   Limb ischemia 03/10/2023   Gross hematuria 07/25/2022   Hemorrhoids 07/25/2022   Acute calculous cholecystitis 03/31/2021   Dry gangrene (HCC)    Cellulitis of left foot 08/17/2020   Pressure injury of skin 07/24/2020   Acute gouty arthritis 07/24/2020   Chronic systolic CHF (congestive heart failure) (HCC) 07/24/2020   Atrial fibrillation, chronic (HCC) 07/24/2020   Iron deficiency anemia 07/24/2020   Leukocytosis 07/24/2020   CKD (chronic kidney disease), stage II    Hypotension    Acute on chronic respiratory failure with hypoxia (HCC)    PVD (peripheral vascular disease)    Acute renal failure superimposed on stage 3a chronic kidney disease (HCC)    Macrocytic anemia    Atherosclerosis of native arteries of extremity with rest pain (HCC) 06/19/2020   Acute on chronic systolic  CHF (congestive heart failure) (HCC) 02/17/2020   Hypokalemia 02/17/2020   Hypomagnesemia 02/17/2020   Elevated troponin 02/17/2020   New onset atrial fibrillation (HCC) 02/17/2020   Olecranon bursitis of left elbow 02/17/2020   NSVT (nonsustained ventricular tachycardia) (HCC) 02/17/2020   Tobacco abuse 02/17/2020   Mural thrombus of atrium 02/17/2020   Gout_left elbow 02/17/2020   Bilateral leg edema 10/19/2018   Weight gain 10/19/2018   Chest pain 02/02/2018   Acute pain of right knee 03/13/2017   Essential hypertension 06/20/2016   Hyperlipidemia 06/20/2016   CAD (coronary artery disease), native coronary artery 06/20/2016   STEMI involving oth  coronary artery of anterior wall (HCC) 06/17/2016   STEMI (ST elevation myocardial infarction) (HCC) 06/17/2016   PCP:  Lars Rea BROCKS, NP Pharmacy:   [facility] [phone] [place], [place] - [address] [place] [address] Phone: [phone] Fax: [phone]     Social Drivers of Health (SDOH) Social History: SDOH Screenings   Food Insecurity: No Food Insecurity (08/11/2024)  Recent Concern: Food Insecurity - Food Insecurity Present (07/11/2024)  Housing: Low Risk (08/11/2024)  Transportation Needs: No Transportation Needs (08/11/2024)  Utilities: At Risk (08/11/2024)  Financial Resource Strain: Low Risk  (03/24/2024)   Received from [facility]  Social Connections: Socially Isolated (07/11/2024)  Tobacco Use: High Risk (08/10/2024)   SDOH Interventions:     Readmission Risk Interventions     No data to display

## 2024-08-12 NOTE — Evaluation (Signed)
 Physical Therapy Evaluation Patient Details Name: Cameron [name] MRN: [id number] DOB: Feb 20, 1961 Today's Date: 08/12/2024  History of Present Illness  Cameron [name] is a 64yoM who [facility] to [facility] on 08/10/24 from vascular surgery clinic FU for progressive left leg erythema with associated warmth and tenderness at the site of his recent left leg vascular bypass. PMH: PAD s/p LLE bypass, CHF, CAD, depression, gout, HTN.  Clinical Impression  Pt reports AMB on unit without device much of today. Pt demonstrates >418ft AMB without device for author, no LOB, gait speed appropriate for home DC. Pt has awareness of limitations and DME at home when he needs it. He does explain 2 falls sustained last week in the snow while trying to fix his well water, but reports no injury sustained. Pt also reports intermittent catching of nephro tube on cabinets at home- access here also quite red. PT recommending DC to home without any services. Pt already has all DME he might need. No additional PT services needed at this time as pt is mobilizing ad lib. PT signing off.         If plan is discharge home, recommend the following: Assist for transportation   Can travel by private vehicle   Yes    Equipment Recommendations None recommended by PT  Recommendations for Other Services       Functional Status Assessment Patient has had a recent decline in their functional status and demonstrates the ability to make significant improvements in function in a reasonable and predictable amount of time.     Precautions / Restrictions Precautions Precautions: Fall Recall of Precautions/Restrictions: Intact Restrictions Weight Bearing Restrictions Per Provider Order: No      Mobility  Bed Mobility Overal bed mobility: Modified Independent Bed Mobility: Supine to Sit     Supine to sit: Modified independent (Device/Increase time)     General bed mobility comments: dons shoes independently    Transfers Overall  transfer level: Modified independent Equipment used: None Transfers: Sit to/from Stand Sit to Stand: Modified independent (Device/Increase time)                Ambulation/Gait Ambulation/Gait assistance: Supervision Gait Distance (Feet): 400 Feet Assistive device: None Gait Pattern/deviations: Step-through pattern, WFL(Within Functional Limits) Gait velocity: 0.16m/s        Stairs Stairs:  (pt confident he can perform entry stairs at home based on his mobility today, yesterday)          Wheelchair Mobility     Tilt Bed    Modified Rankin (Stroke Patients Only)       Balance                                             Pertinent Vitals/Pain Pain Assessment Pain Assessment: 0-10 Pain Score: 6  Pain Location: Left lower leg incision Pain Intervention(s): Limited activity within patient's tolerance, Monitored during session, Premedicated before session    Home Living Family/patient expects to be discharged to:: Private residence Living Arrangements: Alone Available Help at Discharge: Available PRN/intermittently Type of Home: House Home Access: Stairs to enter Entrance Stairs-Rails: Can reach both Entrance Stairs-Number of Steps: 5   Home Layout: One level Home Equipment: Agricultural Consultant (2 wheels);Rollator (4 wheels);Crutches Additional Comments: home from rehab x 1 weeks, has been doing fairly well.    Prior Function Prior Level of Function : Independent/Modified Independent  Mobility Comments: Pt reports only rarely needing walker during gout flares, otherwise active and independent ADLs Comments: Ind with self care and IADLs     Extremity/Trunk Assessment                Communication        Cognition                                         Cueing       General Comments      Exercises     Assessment/Plan    PT Assessment Patient does not need any further PT services  PT  Problem List         PT Treatment Interventions DME instruction;Gait training;Stair training;Functional mobility training;Therapeutic activities;Therapeutic exercise;Balance training;Patient/family education    PT Goals (Current goals can be found in the Care Plan section)  Acute Rehab PT Goals PT Goal Formulation: All assessment and education complete, DC therapy    Frequency       Co-evaluation               AM-PAC PT 6 Clicks Mobility  Outcome Measure Help needed turning from your back to your side while in a flat bed without using bedrails?: None Help needed moving from lying on your back to sitting on the side of a flat bed without using bedrails?: None Help needed moving to and from a bed to a chair (including a wheelchair)?: None Help needed standing up from a chair using your arms (e.g., wheelchair or bedside chair)?: None Help needed to walk in hospital room?: None Help needed climbing 3-5 steps with a railing? : None 6 Click Score: 24    End of Session   Activity Tolerance: Patient tolerated treatment well;No increased pain Patient left: in bed;with call bell/phone within reach   PT Visit Diagnosis: Muscle weakness (generalized) (M62.81);Other abnormalities of gait and mobility (R26.89);Pain Pain - Right/Left: Left Pain - part of body: Leg    Time: 8366-8349 PT Time Calculation (min) (ACUTE ONLY): 17 min   Charges:   PT Evaluation $PT Eval Moderate Complexity: 1 Mod   PT General Charges $$ ACUTE PT VISIT: 1 Visit        5:01 PM, 08/12/24 Cameron [name], PT, DPT Physical Therapist - [facility]  [phone] (ASCOM)     Cameron [name] 08/12/2024, 4:58 PM

## 2024-08-12 NOTE — Plan of Care (Signed)
" °  Problem: Fluid Volume: Goal: Hemodynamic stability will improve 08/12/2024 1531 by Rosalio Tully SAILOR, RN Outcome: Progressing 08/12/2024 1531 by Rosalio Tully SAILOR, RN Outcome: Progressing   Problem: Clinical Measurements: Goal: Diagnostic test results will improve 08/12/2024 1531 by Rosalio Tully SAILOR, RN Outcome: Progressing 08/12/2024 1531 by Rosalio Tully SAILOR, RN Outcome: Progressing Goal: Signs and symptoms of infection will decrease 08/12/2024 1531 by Rosalio Tully SAILOR, RN Outcome: Progressing 08/12/2024 1531 by Rosalio Tully SAILOR, RN Outcome: Progressing   Problem: Respiratory: Goal: Ability to maintain adequate ventilation will improve 08/12/2024 1531 by Rosalio Tully SAILOR, RN Outcome: Progressing 08/12/2024 1531 by Rosalio Tully SAILOR, RN Outcome: Progressing   Problem: Education: Goal: Knowledge of General Education information will improve Description: Including pain rating scale, medication(s)/side effects and non-pharmacologic comfort measures 08/12/2024 1531 by Rosalio Tully SAILOR, RN Outcome: Progressing 08/12/2024 1531 by Rosalio Tully SAILOR, RN Outcome: Progressing   Problem: Health Behavior/Discharge Planning: Goal: Ability to manage health-related needs will improve 08/12/2024 1531 by Rosalio Tully SAILOR, RN Outcome: Progressing 08/12/2024 1531 by Rosalio Tully SAILOR, RN Outcome: Progressing   Problem: Clinical Measurements: Goal: Ability to maintain clinical measurements within normal limits will improve 08/12/2024 1531 by Rosalio Tully SAILOR, RN Outcome: Progressing 08/12/2024 1531 by Rosalio Tully SAILOR, RN Outcome: Progressing Goal: Will remain free from infection Outcome: Progressing Goal: Diagnostic test results will improve Outcome: Progressing Goal: Respiratory complications will improve Outcome: Progressing Goal: Cardiovascular complication will be avoided Outcome: Progressing   Problem: Activity: Goal: Risk for activity intolerance will decrease Outcome: Progressing   Problem: Nutrition: Goal:  Adequate nutrition will be maintained Outcome: Progressing   Problem: Coping: Goal: Level of anxiety will decrease Outcome: Progressing   Problem: Elimination: Goal: Will not experience complications related to bowel motility Outcome: Progressing Goal: Will not experience complications related to urinary retention Outcome: Progressing   Problem: Pain Managment: Goal: General experience of comfort will improve and/or be controlled Outcome: Progressing   Problem: Safety: Goal: Ability to remain free from injury will improve Outcome: Progressing   Problem: Skin Integrity: Goal: Risk for impaired skin integrity will decrease Outcome: Progressing   "

## 2024-08-12 NOTE — Progress Notes (Signed)
 " Progress Note   Patient: Cameron [name] FMW:[id number] DOB: 09/15/1960 DOA: 08/10/2024     2 DOS: the patient was seen and examined on 08/12/2024   Brief hospital course: Partly taken from H&P.  Cameron [name] is a 64 y.o. male with medical history significant for PAD status post left leg bypass, CHF, coronary artery disease, depression, gout, grade 2 diastolic dysfunction, hypertension and dyslipidemia, who presented to the emergency room with acute onset of worsening left leg erythema with associated warmth and tenderness at the site of his recent left leg vascular bypass.  He was seen today in vascular surgery office and referred to the ER for IV antibiotics after having wound culture drawn.   On presentation vital stable, labs with leukocytosis at 12.4, lactic acid 2.0.  Blood cultures were ordered and patient was started on cefepime  and vancomycin .  2/5: Vitals with borderline soft blood pressure at 88/56, holding home Lasix  and spironolactone , improved creatinine to 1.30, leukocytosis resolved but all cell lines decreased so likely some dilutional effect.  Preliminary blood cultures negative.  2/6: Hemodynamically stable, apparently no wound cultures were obtained at vascular surgery office.  Preliminary blood cultures remain negative.  Clinically improved cellulitis.  No surgical intervention needed at this time.  Assessment and Plan: * Sepsis due to cellulitis (HCC) History of PAD s/p bypass of left leg Patient met sepsis criteria with mild tachycardia and leukocytosis which has been resolved.  Preliminary blood cultures negative, apparently no wound cultures were obtained at the vascular surgeon office. Concern of wound infection after left leg bypass procedure. - Will continue antibiotic therapy with IV vancomycin  and cefepime . -No further surgical intervention noted, clinically improving and will we will transition to p.o. antibiotics tomorrow - Vascular surgery is on  board  Coronary artery disease - Continue Toprol -XL and statin therapy.  Paroxysmal atrial fibrillation (HCC) - Will hold off Eliquis  pending potential surgical intervention. - This can be continued postoperatively. - Will continue Toprol -XL.  Dyslipidemia Will continue statin therapy.  Depression - We will continue Zoloft.   Subjective: Patient was seen and examined today.  No new concern.  Asking about the procedure, patient was told that there is no surgical intervention needed at this time.  Physical Exam: Vitals:   08/12/24 0444 08/12/24 0458 08/12/24 0806 08/12/24 1442  BP: (!) 82/54 (!) 89/62 94/62 113/60  Pulse: 74 72 73 62  Resp: 18  16 16   Temp: 98.2 F (36.8 C)  98 F (36.7 C) 97.8 F (36.6 C)  TempSrc:   Oral   SpO2: 98%  100% 100%  Weight:      Height:       General.  Well-developed gentleman, in no acute distress. Pulmonary.  Lungs clear bilaterally, normal respiratory effort. CV.  Regular rate and rhythm, no JVD, rub or murmur. Abdomen.  Soft, nontender, nondistended, BS positive. CNS.  Alert and oriented .  No focal neurologic deficit. Extremities.  No edema,  pulses intact and symmetrical.  Improving erythema around surgical wound of left leg. Psychiatry.  Judgment and insight appears normal.   Data Reviewed: Prior data reviewed  Family Communication: Discussed with patient  Disposition: Status is: Inpatient Remains inpatient appropriate because: Severity of illness  Planned Discharge Destination: Home  DVT prophylaxis.  SCDs Time spent: 50 minutes  This record has been created using Conservation officer, historic buildings. Errors have been sought and corrected,but may not always be located. Such creation errors do not reflect on the standard of care.  Author: Amaryllis Dare, MD 08/12/2024 2:56 PM  For on call review [email or web address].  "

## 2024-08-12 NOTE — Assessment & Plan Note (Signed)
 History of PAD s/p bypass of left leg Patient met sepsis criteria with mild tachycardia and leukocytosis which has been resolved.  Preliminary blood cultures negative, apparently no wound cultures were obtained at the vascular surgeon office. Concern of wound infection after left leg bypass procedure. - Will continue antibiotic therapy with IV vancomycin  and cefepime . -No further surgical intervention noted, clinically improving and will we will transition to p.o. antibiotics tomorrow - Vascular surgery is on board

## 2024-08-12 NOTE — Progress Notes (Signed)
 [facility]  Daily Progress Note   Subjective  -   Wound is improving but some drainage noted.  Patient notes it is still painful with ambulation.  Objective Vitals:   08/12/24 0444 08/12/24 0458 08/12/24 0806 08/12/24 1442  BP: (!) 82/54 (!) 89/62 94/62 113/60  Pulse: 74 72 73 62  Resp: 18  16 16   Temp: 98.2 F (36.8 C)  98 F (36.7 C) 97.8 F (36.6 C)  TempSrc:   Oral   SpO2: 98%  100% 100%  Weight:      Height:        Intake/Output Summary (Last 24 hours) at 08/12/2024 1525 Last data filed at 08/12/2024 1418 Gross per 24 hour  Intake 780 ml  Output 300 ml  Net 480 ml    PULM  CTAB CV  RRR VASC  Erythema with some drainage around LLE incision  Laboratory CBC    Component Value Date/Time   WBC 9.3 08/11/2024 0617   HGB 9.9 (L) 08/11/2024 0617   HGB 10.6 (L) 03/26/2014 1232   HCT 30.9 (L) 08/11/2024 0617   HCT 32.1 (L) 03/26/2014 1232   PLT 81 (L) 08/11/2024 0617   PLT 137 (L) 03/26/2014 1232    BMET    Component Value Date/Time   NA 138 08/11/2024 0617   NA 139 03/28/2014 0823   K 4.1 08/11/2024 0617   K 3.4 (L) 03/28/2014 0823   CL 106 08/11/2024 0617   CL 111 (H) 03/28/2014 0823   CO2 24 08/11/2024 0617   CO2 22 03/28/2014 0823   GLUCOSE 99 08/11/2024 0617   GLUCOSE 106 (H) 03/28/2014 0823   BUN 13 08/11/2024 0617   BUN 8 03/28/2014 0823   CREATININE 1.30 (H) 08/11/2024 0617   CREATININE 0.97 03/28/2014 0823   CALCIUM  7.8 (L) 08/11/2024 0617   CALCIUM  7.8 (L) 03/28/2014 0823   GFRNONAA >60 08/11/2024 0617   GFRNONAA >60 03/28/2014 0823   GFRAA >60 02/21/2020 0630   GFRAA >60 03/28/2014 0823    Assessment/Planning:  -Some staple removed from wound as we feel this may be contributing to inflammation -Advised no surgical intervention due to improvement -Discussed risk of surgical intervention, which could result in possible graft infection, so would prefer conservative approach.  -Transition to PO abx tomorrow if  cellulitis continues resolution  Orvin FORBES Daring  08/12/2024, 3:25 PM

## 2024-09-09 ENCOUNTER — Ambulatory Visit: Admitting: Urology
